# Patient Record
Sex: Male | Born: 1945
Health system: Southern US, Community
[De-identification: ages and names within clinical notes are randomized; demographics above are authoritative.]

## PROBLEM LIST (undated history)

## (undated) DIAGNOSIS — J189 Pneumonia, unspecified organism: Secondary | ICD-10-CM

## (undated) DIAGNOSIS — C4490 Unspecified malignant neoplasm of skin, unspecified: Secondary | ICD-10-CM

## (undated) DIAGNOSIS — I499 Cardiac arrhythmia, unspecified: Secondary | ICD-10-CM

## (undated) DIAGNOSIS — H00019 Hordeolum externum unspecified eye, unspecified eyelid: Secondary | ICD-10-CM

## (undated) DIAGNOSIS — H547 Unspecified visual loss: Secondary | ICD-10-CM

## (undated) DIAGNOSIS — K219 Gastro-esophageal reflux disease without esophagitis: Secondary | ICD-10-CM

## (undated) DIAGNOSIS — I10 Essential (primary) hypertension: Secondary | ICD-10-CM

## (undated) DIAGNOSIS — R0789 Other chest pain: Secondary | ICD-10-CM

## (undated) DIAGNOSIS — J449 Chronic obstructive pulmonary disease, unspecified: Secondary | ICD-10-CM

## (undated) DIAGNOSIS — M1990 Unspecified osteoarthritis, unspecified site: Secondary | ICD-10-CM

## (undated) DIAGNOSIS — R0600 Dyspnea, unspecified: Secondary | ICD-10-CM

## (undated) DIAGNOSIS — J019 Acute sinusitis, unspecified: Secondary | ICD-10-CM

## (undated) DIAGNOSIS — Z8709 Personal history of other diseases of the respiratory system: Secondary | ICD-10-CM

## (undated) DIAGNOSIS — M549 Dorsalgia, unspecified: Secondary | ICD-10-CM

## (undated) HISTORY — DX: Essential (primary) hypertension: I10

## (undated) HISTORY — DX: Chronic obstructive pulmonary disease, unspecified: J44.9

## (undated) HISTORY — DX: Personal history of other diseases of the respiratory system: Z87.09

## (undated) HISTORY — DX: Dorsalgia, unspecified: M54.9

## (undated) HISTORY — DX: Hordeolum externum unspecified eye, unspecified eyelid: H00.019

## (undated) HISTORY — DX: Unspecified visual loss: H54.7

## (undated) HISTORY — DX: Gastro-esophageal reflux disease without esophagitis: K21.9

## (undated) HISTORY — DX: Unspecified osteoarthritis, unspecified site: M19.90

## (undated) HISTORY — PX: TONSILLECTOMY: SUR1361

## (undated) HISTORY — DX: Other chest pain: R07.89

## (undated) HISTORY — DX: Acute sinusitis, unspecified: J01.90

## (undated) HISTORY — PX: OTHER SURGICAL HISTORY: SHX169

## (undated) HISTORY — PX: MOUTH SURGERY: SHX715

## (undated) HISTORY — PX: LUNG SURGERY: SHX703

---

## 2000-10-08 ENCOUNTER — Encounter: Payer: Self-pay | Admitting: Orthopedic Surgery

## 2000-10-10 ENCOUNTER — Ambulatory Visit (HOSPITAL_COMMUNITY): Admission: RE | Admit: 2000-10-10 | Discharge: 2000-10-11 | Payer: Self-pay | Admitting: Orthopedic Surgery

## 2000-10-22 ENCOUNTER — Encounter: Admission: RE | Admit: 2000-10-22 | Discharge: 2000-10-29 | Payer: Self-pay | Admitting: Orthopedic Surgery

## 2002-05-25 ENCOUNTER — Encounter: Payer: Self-pay | Admitting: Critical Care Medicine

## 2002-05-25 ENCOUNTER — Ambulatory Visit (HOSPITAL_COMMUNITY): Admission: RE | Admit: 2002-05-25 | Discharge: 2002-05-25 | Payer: Self-pay | Admitting: Critical Care Medicine

## 2002-07-08 ENCOUNTER — Encounter (INDEPENDENT_AMBULATORY_CARE_PROVIDER_SITE_OTHER): Payer: Self-pay | Admitting: Specialist

## 2002-07-08 ENCOUNTER — Ambulatory Visit (HOSPITAL_COMMUNITY): Admission: RE | Admit: 2002-07-08 | Discharge: 2002-07-08 | Payer: Self-pay | Admitting: Critical Care Medicine

## 2004-07-20 ENCOUNTER — Ambulatory Visit: Payer: Self-pay | Admitting: Critical Care Medicine

## 2004-08-07 ENCOUNTER — Ambulatory Visit: Payer: Self-pay | Admitting: Critical Care Medicine

## 2004-09-04 ENCOUNTER — Ambulatory Visit: Payer: Self-pay | Admitting: Critical Care Medicine

## 2004-10-15 ENCOUNTER — Ambulatory Visit: Payer: Self-pay | Admitting: Critical Care Medicine

## 2004-10-16 ENCOUNTER — Encounter: Admission: RE | Admit: 2004-10-16 | Discharge: 2004-10-16 | Payer: Self-pay | Admitting: Critical Care Medicine

## 2004-11-14 ENCOUNTER — Ambulatory Visit: Payer: Self-pay | Admitting: Critical Care Medicine

## 2004-11-23 ENCOUNTER — Ambulatory Visit (HOSPITAL_COMMUNITY): Admission: RE | Admit: 2004-11-23 | Discharge: 2004-11-23 | Payer: Self-pay | Admitting: Otolaryngology

## 2004-12-19 ENCOUNTER — Ambulatory Visit (HOSPITAL_COMMUNITY): Admission: RE | Admit: 2004-12-19 | Discharge: 2004-12-19 | Payer: Self-pay | Admitting: Otolaryngology

## 2004-12-19 ENCOUNTER — Encounter (INDEPENDENT_AMBULATORY_CARE_PROVIDER_SITE_OTHER): Payer: Self-pay | Admitting: Specialist

## 2005-01-18 ENCOUNTER — Ambulatory Visit: Payer: Self-pay | Admitting: Critical Care Medicine

## 2005-05-31 ENCOUNTER — Ambulatory Visit: Payer: Self-pay | Admitting: Critical Care Medicine

## 2005-09-06 ENCOUNTER — Ambulatory Visit: Payer: Self-pay | Admitting: Critical Care Medicine

## 2006-01-24 ENCOUNTER — Ambulatory Visit: Payer: Self-pay | Admitting: Critical Care Medicine

## 2006-05-21 ENCOUNTER — Ambulatory Visit: Payer: Self-pay | Admitting: Critical Care Medicine

## 2006-11-06 ENCOUNTER — Ambulatory Visit: Payer: Self-pay | Admitting: Critical Care Medicine

## 2007-07-07 DIAGNOSIS — J309 Allergic rhinitis, unspecified: Secondary | ICD-10-CM | POA: Insufficient documentation

## 2007-07-07 DIAGNOSIS — J329 Chronic sinusitis, unspecified: Secondary | ICD-10-CM | POA: Insufficient documentation

## 2007-07-07 DIAGNOSIS — K219 Gastro-esophageal reflux disease without esophagitis: Secondary | ICD-10-CM | POA: Insufficient documentation

## 2007-07-07 DIAGNOSIS — J852 Abscess of lung without pneumonia: Secondary | ICD-10-CM | POA: Insufficient documentation

## 2007-07-07 DIAGNOSIS — J449 Chronic obstructive pulmonary disease, unspecified: Secondary | ICD-10-CM | POA: Insufficient documentation

## 2007-10-30 ENCOUNTER — Ambulatory Visit: Payer: Self-pay | Admitting: Critical Care Medicine

## 2008-04-25 ENCOUNTER — Encounter: Payer: Self-pay | Admitting: Critical Care Medicine

## 2008-04-26 ENCOUNTER — Telehealth (INDEPENDENT_AMBULATORY_CARE_PROVIDER_SITE_OTHER): Payer: Self-pay | Admitting: *Deleted

## 2008-04-27 ENCOUNTER — Ambulatory Visit: Payer: Self-pay | Admitting: Critical Care Medicine

## 2008-05-03 HISTORY — PX: NECK SURGERY: SHX720

## 2008-05-06 ENCOUNTER — Encounter: Payer: Self-pay | Admitting: Critical Care Medicine

## 2008-05-17 ENCOUNTER — Telehealth: Payer: Self-pay | Admitting: Critical Care Medicine

## 2008-05-23 ENCOUNTER — Inpatient Hospital Stay (HOSPITAL_COMMUNITY): Admission: RE | Admit: 2008-05-23 | Discharge: 2008-05-24 | Payer: Self-pay | Admitting: Neurosurgery

## 2008-05-25 ENCOUNTER — Telehealth: Payer: Self-pay | Admitting: Critical Care Medicine

## 2008-06-15 ENCOUNTER — Encounter: Payer: Self-pay | Admitting: Critical Care Medicine

## 2008-07-15 ENCOUNTER — Encounter: Payer: Self-pay | Admitting: Critical Care Medicine

## 2008-08-12 ENCOUNTER — Ambulatory Visit: Payer: Self-pay | Admitting: Critical Care Medicine

## 2008-09-02 HISTORY — PX: OTHER SURGICAL HISTORY: SHX169

## 2008-09-12 ENCOUNTER — Inpatient Hospital Stay (HOSPITAL_COMMUNITY): Admission: RE | Admit: 2008-09-12 | Discharge: 2008-09-15 | Payer: Self-pay | Admitting: Neurosurgery

## 2009-06-21 ENCOUNTER — Ambulatory Visit (HOSPITAL_COMMUNITY): Admission: RE | Admit: 2009-06-21 | Discharge: 2009-06-21 | Payer: Self-pay | Admitting: Neurosurgery

## 2009-06-23 ENCOUNTER — Encounter: Payer: Self-pay | Admitting: Critical Care Medicine

## 2009-06-28 ENCOUNTER — Inpatient Hospital Stay (HOSPITAL_COMMUNITY): Admission: RE | Admit: 2009-06-28 | Discharge: 2009-07-03 | Payer: Self-pay | Admitting: Neurosurgery

## 2009-06-29 ENCOUNTER — Ambulatory Visit: Payer: Self-pay | Admitting: Physical Medicine & Rehabilitation

## 2009-10-03 HISTORY — PX: HERNIA REPAIR: SHX51

## 2009-10-04 ENCOUNTER — Ambulatory Visit (HOSPITAL_BASED_OUTPATIENT_CLINIC_OR_DEPARTMENT_OTHER): Admission: RE | Admit: 2009-10-04 | Discharge: 2009-10-04 | Payer: Self-pay | Admitting: General Surgery

## 2009-12-07 ENCOUNTER — Ambulatory Visit: Payer: Self-pay | Admitting: Critical Care Medicine

## 2009-12-26 ENCOUNTER — Encounter: Payer: Self-pay | Admitting: Critical Care Medicine

## 2010-01-02 ENCOUNTER — Encounter: Payer: Self-pay | Admitting: Critical Care Medicine

## 2010-01-30 ENCOUNTER — Ambulatory Visit: Payer: Self-pay | Admitting: Critical Care Medicine

## 2010-03-28 ENCOUNTER — Ambulatory Visit: Payer: Self-pay | Admitting: Critical Care Medicine

## 2010-04-11 ENCOUNTER — Ambulatory Visit: Payer: Self-pay | Admitting: Critical Care Medicine

## 2010-05-15 ENCOUNTER — Ambulatory Visit: Payer: Self-pay | Admitting: Critical Care Medicine

## 2010-05-23 ENCOUNTER — Ambulatory Visit: Payer: Self-pay | Admitting: Internal Medicine

## 2010-05-23 DIAGNOSIS — I10 Essential (primary) hypertension: Secondary | ICD-10-CM | POA: Insufficient documentation

## 2010-06-20 ENCOUNTER — Ambulatory Visit: Payer: Self-pay | Admitting: Internal Medicine

## 2010-06-20 LAB — CONVERTED CEMR LAB
ALT: 18 units/L (ref 0–53)
AST: 24 units/L (ref 0–37)
Albumin: 4.1 g/dL (ref 3.5–5.2)
Alkaline Phosphatase: 72 units/L (ref 39–117)
BUN: 25 mg/dL — ABNORMAL HIGH (ref 6–23)
Basophils Absolute: 0.1 10*3/uL (ref 0.0–0.1)
Basophils Relative: 1 % (ref 0–1)
Bilirubin, Direct: 0.2 mg/dL (ref 0.0–0.3)
CO2: 23 meq/L (ref 19–32)
Calcium: 9.1 mg/dL (ref 8.4–10.5)
Chloride: 106 meq/L (ref 96–112)
Cholesterol: 137 mg/dL (ref 0–200)
Creatinine, Ser: 0.83 mg/dL (ref 0.40–1.50)
Eosinophils Absolute: 0.1 10*3/uL (ref 0.0–0.7)
Eosinophils Relative: 2 % (ref 0–5)
Glucose, Bld: 96 mg/dL (ref 70–99)
HCT: 40.4 % (ref 39.0–52.0)
HDL: 51 mg/dL (ref >39)
Hemoglobin: 13.4 g/dL (ref 13.0–17.0)
Indirect Bilirubin: 0.7 mg/dL (ref 0.0–0.9)
LDL Cholesterol: 73 mg/dL (ref 0–99)
Lymphocytes Relative: 24 % (ref 12–46)
Lymphs Abs: 1.3 10*3/uL (ref 0.7–4.0)
MCHC: 33.2 g/dL (ref 30.0–36.0)
MCV: 90.2 fL (ref 78.0–100.0)
Monocytes Absolute: 0.4 10*3/uL (ref 0.1–1.0)
Monocytes Relative: 7 % (ref 3–12)
Neutro Abs: 3.7 10*3/uL (ref 1.7–7.7)
Neutrophils Relative %: 67 % (ref 43–77)
PSA: 1.07 ng/mL (ref 0.10–4.00)
Platelets: 279 10*3/uL (ref 150–400)
Potassium: 4.1 meq/L (ref 3.5–5.3)
RBC: 4.48 M/uL (ref 4.22–5.81)
RDW: 13.8 % (ref 11.5–15.5)
Sodium: 141 meq/L (ref 135–145)
TSH: 0.636 microintl units/mL (ref 0.350–4.500)
Total Bilirubin: 0.9 mg/dL (ref 0.3–1.2)
Total CHOL/HDL Ratio: 2.7
Total Protein: 6.5 g/dL (ref 6.0–8.3)
Triglycerides: 64 mg/dL (ref <150)
VLDL: 13 mg/dL (ref 0–40)
WBC: 5.5 10*3/uL (ref 4.0–10.5)

## 2010-06-21 ENCOUNTER — Encounter: Payer: Self-pay | Admitting: Internal Medicine

## 2010-06-21 ENCOUNTER — Telehealth: Payer: Self-pay | Admitting: Internal Medicine

## 2010-06-26 ENCOUNTER — Telehealth: Payer: Self-pay | Admitting: Internal Medicine

## 2010-06-27 ENCOUNTER — Telehealth: Payer: Self-pay | Admitting: Critical Care Medicine

## 2010-07-04 ENCOUNTER — Telehealth (INDEPENDENT_AMBULATORY_CARE_PROVIDER_SITE_OTHER): Payer: Self-pay | Admitting: *Deleted

## 2010-07-16 ENCOUNTER — Telehealth (INDEPENDENT_AMBULATORY_CARE_PROVIDER_SITE_OTHER): Payer: Self-pay | Admitting: *Deleted

## 2010-08-15 ENCOUNTER — Encounter: Payer: Self-pay | Admitting: Internal Medicine

## 2010-08-15 ENCOUNTER — Ambulatory Visit: Payer: Self-pay | Admitting: Internal Medicine

## 2010-08-15 DIAGNOSIS — M1990 Unspecified osteoarthritis, unspecified site: Secondary | ICD-10-CM | POA: Insufficient documentation

## 2010-08-15 LAB — CONVERTED CEMR LAB
BUN: 19 mg/dL (ref 6–23)
CO2: 26 meq/L (ref 19–32)
Calcium: 9.6 mg/dL (ref 8.4–10.5)
Chloride: 107 meq/L (ref 96–112)
Creatinine, Ser: 0.85 mg/dL (ref 0.40–1.50)
Glucose, Bld: 83 mg/dL (ref 70–99)
Potassium: 4.5 meq/L (ref 3.5–5.3)
Sodium: 142 meq/L (ref 135–145)

## 2010-08-17 ENCOUNTER — Encounter: Payer: Self-pay | Admitting: Internal Medicine

## 2010-08-20 ENCOUNTER — Telehealth: Payer: Self-pay | Admitting: Internal Medicine

## 2010-09-06 ENCOUNTER — Telehealth (INDEPENDENT_AMBULATORY_CARE_PROVIDER_SITE_OTHER): Payer: Self-pay | Admitting: *Deleted

## 2010-09-12 ENCOUNTER — Ambulatory Visit: Admit: 2010-09-12 | Payer: Self-pay | Admitting: Internal Medicine

## 2010-09-25 ENCOUNTER — Telehealth (INDEPENDENT_AMBULATORY_CARE_PROVIDER_SITE_OTHER): Payer: Self-pay | Admitting: *Deleted

## 2010-10-04 NOTE — Miscellaneous (Signed)
Summary: Orders Update  Clinical Lists Changes  Orders: Added new Service order of Est. Patient Level III (99213) - Signed 

## 2010-10-04 NOTE — Progress Notes (Signed)
Summary: CLEARANCE--responded  Phone Note Other Incoming Call back at [phone]   Call placed by: DR HIRSCH'S OFFICE REGINA Call placed to: WRIGHT Summary of Call: NEED PRE OP CLEARANCE FOR SURGERY PRESS 0 AND HAVE REGINA OVER HEAD PAGE Initial call taken by: Rickard Patience,  April 26, 2008 9:40 AM  Follow-up for Phone Call        Pt will need ov for surgical clearance, per PW. Michel Bickers [facility]  April 26, 2008 12:31 PM  called and spoke with regina. informed her pt needs ov with dr. Delford Field first in order for surgical clearance.  called and spoke with pt. pt made an appt with dr. Delford Field for wed august 26 @ 9:50am.  Cyndia Diver LPN  April 26, 2008 2:11 PM

## 2010-10-04 NOTE — Assessment & Plan Note (Signed)
Summary: mole removal and follow up/mhf   Vital Signs:  Patient profile:   65 year old male Height:      67.5 inches Weight:      149.50 pounds BMI:     23.15 O2 Sat:      97 % on Room air Temp:     97.8 degrees F oral Pulse rate:   63 / minute Pulse rhythm:   regular Resp:     18 per minute BP sitting:   140 / 80  (left arm) Cuff size:   regular  Vitals Entered By: Glendell Docker CMA (June 20, 2010 9:30 AM)  O2 Flow:  Room air  Primary Care Provider:  D. Thomos Lemons DO   History of Present Illness:  Hypertension Follow-Up      This is a 65 year old man who presents for Hypertension follow-up.  The patient denies lightheadedness and headaches.  The patient denies the following associated symptoms: chest pain.  Compliance with medications (by patient report) has been near 100%.  tolerating losartan  he is concerned re:  enlarging moles  Preventive Screening-Counseling & Management  Alcohol-Tobacco     Smoking Status: quit  Allergies: No Known Drug Allergies  Past History:  Past Medical History: Allergic rhinitis COPD    -FeV1 64% 2007 GERD  Hx lung abscess/bronchiectasis with RML and RLL resection - 1996 (Dr. Edwyna Shell) Hypertension  Past Surgical History: RML and RUL removed.   due to bleeding and bronchiectasis  and lung abscess  Neck Surgery 05/2008 lower back surgery 09/2008 Lower back surgery 06/2009  hernia surgery 10/2009  Family History: Prostate Cancer father died from this  (age 31) Family History of Arthritis Family History of CAD Male 1st degree relative <60 Family History Hypertension Family History of Stroke M 1st degree relative <50 mother died of CHF - age 20    Social History: Retired Librarian, academic Patient states former smoker. 1 1/2 ppd x 38 yrs  Quit in Jan 2002 Married - 2 weeks (4th marriage) divorced,  remarried 84-2004 (lost wife to lung ca),  remarried (divorced),  1 son  - 7 [name]) Alcohol use-yes (2-3 beers per  week)   Physical Exam  General:  alert, well-developed, and well-nourished.   Lungs:  normal respiratory effort, no crackles, and no wheezes.   Heart:  normal rate, regular rhythm, and no gallop.   Skin:  2-3 mm pearly raised lesion below right scapula  4 mm hyperpigmented lesion with irregular border - right thoraco - lumbar region of right back   Impression & Recommendations:  Problem # 1:  DYSPLASTIC NEVUS, BACK (ICD-448.1)  pt with 2 enlarging hyperpigmented lesions on his back consent obtained. area prepped shave biopsy performed utilizing aseptic technique .5 cc of 1% lidocaine use as anesthetic no complications after care discussed  Orders: Shave Skin Lesion 0.6-1.0 cm/trunk/arm/leg (11301)  Problem # 2:  HYPERTENSION (ICD-401.9) Assessment: Improved titrate losartan to 100 mg  His updated medication list for this problem includes:    Amlodipine Besylate 5 Mg Tabs (Amlodipine besylate) .Marland Kitchen... Take 1 tablet by mouth once a day    Losartan Potassium 100 Mg Tabs (Losartan potassium) ..... One by mouth once daily  BP today: 130/80 Prior BP: 150/90 (05/23/2010)  Complete Medication List: 1)  Nasacort Aq 55 Mcg/act Aers (Triamcinolone acetonide(nasal)) .... Two puff once daily ea nostirl 2)  Amlodipine Besylate 5 Mg Tabs (Amlodipine besylate) .... Take 1 tablet by mouth once a day 3)  Pulmicort  Flexhaler 180 Mcg/act Inha (Budesonide (inhalation)) .... Two  puffs once daily 4)  Proair Hfa 108 (90 Base) Mcg/act Aers (Albuterol sulfate) .Marland Kitchen.. 1-2 puffs every 4-6 hours as needed 5)  Diclofenac Sodium 75 Mg Tbec (Diclofenac sodium) .Marland Kitchen.. 1 by mouth two times a day 6)  Mucinex 600 Mg Xr12h-tab (Guaifenesin) .... Two times a day 7)  Tums E-x 750 750 Mg Chew (Calcium carbonate antacid) .... Two times a day 8)  Glucosamine-chondroitin 500-400 Mg Caps (Glucosamine-chondroitin) .... Two times a day 9)  Multivitamins Tabs (Multiple vitamin) .... Three times weekly 10)  Losartan  Potassium 100 Mg Tabs (Losartan potassium) .... One by mouth once daily  Patient Instructions: 1)  Please schedule a follow-up appointment in 2 months. Prescriptions: LOSARTAN POTASSIUM 100 MG TABS (LOSARTAN POTASSIUM) one by mouth once daily  #30 x 1   Entered and Authorized by:   D. Thomos Lemons DO   Signed by:   D. Thomos Lemons DO on 06/20/2010   Method used:   Electronically to        [facility]  [address]* (retail)       [address]       [place], [place]  [address]       Ph: [phone]       Fax: [phone]   RxID:   [id number]    Orders Added: 1)  Est. Patient Level III [95638] 2)  Shave Skin Lesion 0.6-1.0 cm/trunk/arm/leg [11301]

## 2010-10-04 NOTE — Progress Notes (Signed)
Summary: Biopsy Results  Phone Note Outgoing Call   Summary of Call: call pt - skin biopsy results - negative for cancer.  (plz mail copy of path report to pt) Initial call taken by: D. Thomos Lemons DO,  June 21, 2010 5:59 PM  Follow-up for Phone Call        call placed to patient at [phone], he has been advised per Dr Artist Pais instructions. Copy of report mailed to patient  Follow-up by: Glendell Docker CMA,  June 22, 2010 8:33 AM

## 2010-10-04 NOTE — Assessment & Plan Note (Signed)
Summary: Pulmonary OV   Primary Provider/Referring Provider:  Wyvonnia Lora  CC:  2 month follow up.  Pt states breathing is not doing as well on qvar as it was on pulmicort.  States he is having increased SOB with activity, coughing more frequently-occ prod with clear mucus, and occ wheezing when outside.  Denies chest tightness.  Marland Kitchen  History of Present Illness: Pulmonary OV :     This is a 65 year old, white male, history of chronic obstructive airways disease, asthmatic bronchitic component.     December 07, 2009 12:04 PM Had neck surgery 9/09.    Not able to obtain pulmicort now,  now on flovent Then had lower back surgery, 09/2008.    Then july 2010: tried to resume normal activity and had disc rupture and had to go back to another work in lower back 10/10.  then hernia and this repaired 2/11.   switched to flovent from pulmicort   (was out)  Jan 30, 2010 9:11 AM Not as well with qvar even with aerochamber. Pt was doing better on pulmicort. Pt stated pharmacy not able to obtain pulmicort. Notes more dyspnea and wheeze and cough with phlegm.  On qvar  two puff two times a day    Preventive Screening-Counseling & Management  Alcohol-Tobacco     Smoking Status: quit > 6 months  Current Medications (verified): 1)  Nasacort Aq 55 Mcg/act  Aers (Triamcinolone Acetonide(Nasal)) .... Two Puff Once Daily Ea Nostirl 2)  Amlodipine Besylate 10 Mg Tabs (Amlodipine Besylate) .... Once Daily 3)  Qvar 40 Mcg/act  Aers (Beclomethasone Dipropionate) .... Two Puffs Twice Daily Use With Aerochamber 4)  Proair Hfa 108 (90 Base) Mcg/act  Aers (Albuterol Sulfate) .Marland Kitchen.. 1-2 Puffs Every 4-6 Hours As Needed 5)  Diclofenac Sodium 75 Mg Tbec (Diclofenac Sodium) .Marland Kitchen.. 1 By Mouth Two Times A Day 6)  Mucinex 600 Mg Xr12h-Tab (Guaifenesin) .... Two Times A Day 7)  Tums E-X 750 750 Mg Chew (Calcium Carbonate Antacid) .... Two Times A Day 8)  Glucosamine-Chondroitin 500-400 Mg Caps (Glucosamine-Chondroitin)  .... Two Times A Day 9)  Vitamin E 400 Unit Caps (Vitamin E) .... Once Daily 10)  Aerochamber Mv  Misc (Spacer/aero-Holding Chambers) .... Use With Spacer 11)  Multivitamins  Tabs (Multiple Vitamin) .... Three Times Weekly  Allergies (verified): No Known Drug Allergies  Past History:  Past medical, surgical, family and social histories (including risk factors) reviewed, and no changes noted (except as noted below).  Past Medical History: Reviewed history from 04/27/2008 and no changes required. Allergic rhinitis COPD    -FeV1 64% 2007 GERD Hx lung abscess/bronchiectasis with RML and RLL resection   Past Surgical History: Reviewed history from 12/07/2009 and no changes required. RML and RUL removed.   due to bleeding and bronchiectasis  and lung abscess   Neck Surgery 05/2008 lower back surgery 09/2008 Lower back surgery 06/2009 hernia surgery 10/2009  Family History: Reviewed history from 10/30/2007 and no changes required. Prostate Cancer father died from this  Social History: Reviewed history from 10/30/2007 and no changes required. Patient states former smoker.   Review of Systems       The patient complains of shortness of breath with activity, shortness of breath at rest, and non-productive cough.  The patient denies productive cough, coughing up blood, chest pain, irregular heartbeats, acid heartburn, indigestion, loss of appetite, weight change, abdominal pain, difficulty swallowing, sore throat, tooth/dental problems, headaches, nasal congestion/difficulty breathing through nose, sneezing, itching, ear ache, anxiety,  depression, hand/feet swelling, joint stiffness or pain, rash, change in color of mucus, and fever.    Vital Signs:  Patient profile:   65 year old male Height:      68 inches Weight:      153 pounds BMI:     23.35 O2 Sat:      96 % on Room air Temp:     98.3 degrees F oral Pulse rate:   95 / minute BP sitting:   160 / 88  (right arm) Cuff size:    regular  Vitals Entered By: Gweneth Dimitri RN (Jan 30, 2010 9:04 AM)  O2 Flow:  Room air CC: 2 month follow up.  Pt states breathing is not doing as well on qvar as it was on pulmicort.  States he is having increased SOB with activity, coughing more frequently-occ prod with clear mucus, occ wheezing when outside.  Denies chest tightness.   Comments Medications reviewed with patient Daytime contact number verified with patient. Gweneth Dimitri RN  Jan 30, 2010 9:07 AM    Physical Exam  Additional Exam:  Gen: WD WN   WM       in NAD    NCAT Heent:  no jvd, no TMG, no cervical LNademopathy, orophyx clear,  nares with clear watery drainage. Cor: RRR nl s1/s2  no s3/s4  no m r h g Abd: soft NT BSA   no masses  No HSM  no rebound or guarding Ext perfused with no c v e v.d Neuro: intact, moves all 4s, CN II-XII intact, DTRs intact Chest: distant BS  no wheezes, rales, rhonchi   no egophony  no consolidative breath sounds, mild hyperresonance to percussion, prominant pseudowheeze Skin: clear  Genital/Rectal :deferred    Impression & Recommendations:  Problem # 1:  COPD (ICD-496) Assessment Deteriorated  copd with upper airway obstruction due to prior cspine surgery made worse with flovent hfa  and not doing well on qvar  plan change back to pulmicort,  two puff twice daily samples given Proair as needed Return two months   Medications Added to Medication List This Visit: 1)  Pulmicort Flexhaler 180 Mcg/act Inha (Budesonide (inhalation)) .... Two  puffs twice daily 2)  Multivitamins Tabs (Multiple vitamin) .... Three times weekly  Complete Medication List: 1)  Nasacort Aq 55 Mcg/act Aers (Triamcinolone acetonide(nasal)) .... Two puff once daily ea nostirl 2)  Amlodipine Besylate 10 Mg Tabs (Amlodipine besylate) .... Once daily 3)  Pulmicort Flexhaler 180 Mcg/act Inha (Budesonide (inhalation)) .... Two  puffs twice daily 4)  Proair Hfa 108 (90 Base) Mcg/act Aers (Albuterol sulfate)  .Marland Kitchen.. 1-2 puffs every 4-6 hours as needed 5)  Diclofenac Sodium 75 Mg Tbec (Diclofenac sodium) .Marland Kitchen.. 1 by mouth two times a day 6)  Mucinex 600 Mg Xr12h-tab (Guaifenesin) .... Two times a day 7)  Tums E-x 750 750 Mg Chew (Calcium carbonate antacid) .... Two times a day 8)  Glucosamine-chondroitin 500-400 Mg Caps (Glucosamine-chondroitin) .... Two times a day 9)  Vitamin E 400 Unit Caps (Vitamin e) .... Once daily 10)  Aerochamber Mv Misc (Spacer/aero-holding chambers) .... Use with spacer 11)  Multivitamins Tabs (Multiple vitamin) .... Three times weekly  Other Orders: Est. Patient Level III (60454)  Patient Instructions: 1)  Stop Qvar 2)  Start Pulmicort two puff twice a day for now,  will see about reduction in dose later 3)  Return two months Prescriptions: PULMICORT FLEXHALER 180 MCG/ACT  INHA (BUDESONIDE (INHALATION)) Two  puffs twice daily  Brand medically necessary #1 x 6   Entered and Authorized by:   Storm Frisk MD   Signed by:   Storm Frisk MD on 01/30/2010   Method used:   Electronically to        [facility]  [address] (retail)       [address]       [place], [place]  [address]       Ph: [phone]       Fax: [phone]   RxID:   [id number]     Appended Document: Pulmonary OV fax Wyvonnia Lora

## 2010-10-04 NOTE — Progress Notes (Signed)
  Phone Note Other Incoming   Request: Send information Summary of Call: Request for records received from [employer].  Request forwarded to [facility].

## 2010-10-04 NOTE — Letter (Signed)
Summary: CMN for Aerochamber/[facility]  CMN for Aerochamber/[facility]   Imported By: Sherian Rein 01/01/2010 07:48:02  _____________________________________________________________________  External Attachment:    Type:   Image     Comment:   External Document

## 2010-10-04 NOTE — Letter (Signed)
Summary: ACF for myelopathy/[facility]  ACF for myelopathy/[facility]   Imported By: Lester [place] 07/05/2008 10:58:33  _____________________________________________________________________  External Attachment:    Type:   Image     Comment:   External Document

## 2010-10-04 NOTE — Progress Notes (Signed)
Summary: refill-- diclofenac, amlodipine  Phone Note Call from Patient Call back at Home Phone [phone] Call back at cell [phone]   Caller: Patient Call For: Sanjuana Mruk  Summary of Call: please send all his rx's to [facility]  He has [employer] BZZW [id number]  Initial call taken by: Roselle Locus,  June 26, 2010 10:45 AM  Follow-up for Phone Call        Left message on phone to call us with specific med names that he needs refilled.  Inhalers need to be refilled by pulmonology. Nicki Guadalajara Fergerson CMA Duncan Dull)  June 27, 2010 9:06 AM   Additional Follow-up for Phone Call Additional follow up Details #1::        Pt called back stating he needs refills on Amlodipine, diclofenac sodium, Nasacort and Pulmicort. Advised pt we will send refills on Amlodipine and Diclofenac but Dr. Delford Field will need to refill nasacort and pulmicort flex haler; request has been sent to Dr Delford Field for these medications. Nicki Guadalajara Fergerson CMA Duncan Dull)  June 27, 2010 9:45 AM     Additional Follow-up for Phone Call Additional follow up Details #2::    we can refill amlodipine.   I suggest OV to discuss long term use of NSAIDs ok to provide only 1 month refill on diclofenac Follow-up by: D. Thomos Lemons DO,  June 27, 2010 11:56 AM  Additional Follow-up for Phone Call Additional follow up Details #3:: Details for Additional Follow-up Action Taken: Left message on machine to return my call. Nicki Guadalajara Fergerson CMA Duncan Dull)  June 27, 2010 3:33 PM   Pt returned my call and was notified per Dr Olegario Messier instruction. Pt states he just picked up a 90 day supply on his Diclofenac from his previous doctor and will discuss long term use with Dr Artist Pais at his next visit. Pt also states he did not need Amlodipine refilled at present but was just wanting [facility] to have it on file as his insurance was getting ready to change. Pt states he received a call from [facility] and he told them not to send the Amlodipine yet. He will contact us when he is ready  for a refill. Nicki Guadalajara Fergerson CMA Duncan Dull)  June 27, 2010 5:15 PM   Prescriptions: AMLODIPINE BESYLATE 5 MG TABS (AMLODIPINE BESYLATE) Take 1 tablet by mouth once a day  #90 x 3   Entered and Authorized by:   D. Thomos Lemons DO   Signed by:   D. Thomos Lemons DO on 06/27/2010   Method used:   Faxed to ...       [facility] (mail-order)             , [place]         Ph: [phone]       Fax: [phone]   RxID:   [id number]

## 2010-10-04 NOTE — Letter (Signed)
   [place] [facility] [address] [place], [place]  [address]  [place] Phone: [phone]      August 17, 2010   [facility] [address] [place], [place] [address]  RE:  LAB RESULTS  Dear  Mr. TESTERMAN,  The following is an interpretation of your most recent lab tests.  Please take note of any instructions provided or changes to medications that have resulted from your lab work.  ELECTROLYTES:  Good - no changes needed  KIDNEY FUNCTION TESTS:  Good - no changes needed         Sincerely Yours,    Dr. Thomos Lemons  Appended Document:  mailed

## 2010-10-04 NOTE — Assessment & Plan Note (Signed)
Summary: Pulmonary OV   Visit Type:  Follow-up Primary Provider/Referring Provider:  Wyvonnia Lora  CC:  COPD follow-up. The patient says his SOB is worse due to the heat and humidity. He is also clearing his throat more.Marland Kitchen  History of Present Illness: Pulmonary OV :     This is a 65 year old, white male, history of chronic obstructive airways disease, asthmatic bronchitic component.     Jan 30, 2010 9:11 AM Not as well with qvar even with aerochamber. Pt was doing better on pulmicort. Pt stated pharmacy not able to obtain pulmicort. Notes more dyspnea and wheeze and cough with phlegm.  On qvar  two puff two times a day   March 28, 2010 4:28 PM The pt  is doing better,  Not much cough,  There is  sl more dyspnea with heat and humidity. Pt denies any significant sore throat, nasal congestion or excess secretions, fever, chills, sweats, unintended weight loss, pleurtic or exertional chest pain, orthopnea PND, or leg swelling Pt denies any increase in rescue therapy over baseline, denies waking up needing it or having any early am or nocturnal exacerbations of coughing/wheezing/or dyspnea. The pulmicort is working better for the patient.  Preventive Screening-Counseling & Management  Alcohol-Tobacco     Smoking Status: quit > 6 months     Year Quit: 2002     Pack years: 17  Current Medications (verified): 1)  Nasacort Aq 55 Mcg/act  Aers (Triamcinolone Acetonide(Nasal)) .... Two Puff Once Daily Ea Nostirl 2)  Amlodipine Besylate 10 Mg Tabs (Amlodipine Besylate) .... Once Daily 3)  Pulmicort Flexhaler 180 Mcg/act  Inha (Budesonide (Inhalation)) .... Two  Puffs Twice Daily 4)  Proair Hfa 108 (90 Base) Mcg/act  Aers (Albuterol Sulfate) .Marland Kitchen.. 1-2 Puffs Every 4-6 Hours As Needed 5)  Diclofenac Sodium 75 Mg Tbec (Diclofenac Sodium) .Marland Kitchen.. 1 By Mouth Two Times A Day 6)  Mucinex 600 Mg Xr12h-Tab (Guaifenesin) .... Two Times A Day 7)  Tums E-X 750 750 Mg Chew (Calcium Carbonate Antacid)  .... Two Times A Day 8)  Glucosamine-Chondroitin 500-400 Mg Caps (Glucosamine-Chondroitin) .... Two Times A Day 9)  Vitamin E 400 Unit Caps (Vitamin E) .... Once Daily 10)  Multivitamins  Tabs (Multiple Vitamin) .... Three Times Weekly  Allergies (verified): No Known Drug Allergies  Past History:  Past medical, surgical, family and social histories (including risk factors) reviewed, and no changes noted (except as noted below).  Past Medical History: Reviewed history from 04/27/2008 and no changes required. Allergic rhinitis COPD    -FeV1 64% 2007 GERD Hx lung abscess/bronchiectasis with RML and RLL resection   Past Surgical History: Reviewed history from 12/07/2009 and no changes required. RML and RUL removed.   due to bleeding and bronchiectasis  and lung abscess   Neck Surgery 05/2008 lower back surgery 09/2008 Lower back surgery 06/2009 hernia surgery 10/2009  Family History: Reviewed history from 10/30/2007 and no changes required. Prostate Cancer father died from this  Social History: Reviewed history from 10/30/2007 and no changes required. Patient states former smoker.   Review of Systems       The patient complains of shortness of breath with activity and non-productive cough.  The patient denies shortness of breath at rest, productive cough, coughing up blood, chest pain, irregular heartbeats, acid heartburn, indigestion, loss of appetite, weight change, abdominal pain, difficulty swallowing, sore throat, tooth/dental problems, headaches, nasal congestion/difficulty breathing through nose, sneezing, itching, ear ache, anxiety, depression, hand/feet swelling, joint stiffness or pain, rash, change  in color of mucus, and fever.    Vital Signs:  Patient profile:   65 year old male Height:      68 inches (172.72 cm) Weight:      150.13 pounds (68.24 kg) BMI:     22.91 O2 Sat:      97 % on Room air Temp:     98.5 degrees F (36.94 degrees C) oral Pulse rate:   94 /  minute BP sitting:   116 / 74  (right arm) Cuff size:   regular  Vitals Entered By: Michel Bickers CMA (March 28, 2010 4:11 PM)  O2 Sat at Rest %:  97 O2 Flow:  Room air CC: COPD follow-up. The patient says his SOB is worse due to the heat and humidity. He is also clearing his throat more. Comments Medications reviewed with the patient. Daytime phone verified. Michel Bickers CMA  March 28, 2010 4:12 PM   Physical Exam  Additional Exam:  Gen: WD WN   WM       in NAD    NCAT Heent:  no jvd, no TMG, no cervical LNademopathy, orophyx clear,  nares with clear watery drainage. Cor: RRR nl s1/s2  no s3/s4  no m r h g Abd: soft NT BSA   no masses  No HSM  no rebound or guarding Ext perfused with no c v e v.d Neuro: intact, moves all 4s, CN II-XII intact, DTRs intact Chest: distant BS  no wheezes, rales, rhonchi   no egophony  no consolidative breath sounds, mild hyperresonance to percussion, prominant pseudowheeze Skin: clear  Genital/Rectal :deferred    Impression & Recommendations:  Problem # 1:  COPD (ICD-496) Assessment Improved  copd with upper airway obstruction due to prior cspine surgery made worse with flovent hfa  and not doing well on qvar better with pulmicort flexihaler  plan try to wean pulmicort to two puff daily  Medications Added to Medication List This Visit: 1)  Pulmicort Flexhaler 180 Mcg/act Inha (Budesonide (inhalation)) .... Two  puffs once daily  Complete Medication List: 1)  Nasacort Aq 55 Mcg/act Aers (Triamcinolone acetonide(nasal)) .... Two puff once daily ea nostirl 2)  Amlodipine Besylate 10 Mg Tabs (Amlodipine besylate) .... Once daily 3)  Pulmicort Flexhaler 180 Mcg/act Inha (Budesonide (inhalation)) .... Two  puffs once daily 4)  Proair Hfa 108 (90 Base) Mcg/act Aers (Albuterol sulfate) .Marland Kitchen.. 1-2 puffs every 4-6 hours as needed 5)  Diclofenac Sodium 75 Mg Tbec (Diclofenac sodium) .Marland Kitchen.. 1 by mouth two times a day 6)  Mucinex 600 Mg Xr12h-tab (Guaifenesin)  .... Two times a day 7)  Tums E-x 750 750 Mg Chew (Calcium carbonate antacid) .... Two times a day 8)  Glucosamine-chondroitin 500-400 Mg Caps (Glucosamine-chondroitin) .... Two times a day 9)  Vitamin E 400 Unit Caps (Vitamin e) .... Once daily 10)  Multivitamins Tabs (Multiple vitamin) .... Three times weekly  Other Orders: Est. Patient Level III (91478)  Patient Instructions: 1)  Reduce pulmicort two puffs daily 2)  Return 4 months   Appended Document: Pulmonary OV fax david tapper

## 2010-10-04 NOTE — Letter (Signed)
   [facility] [address] [place], [place]  [address]  [place] Phone: [phone]      June 21, 2010   [facility] [address] [place], [place] [address]  RE:  LAB RESULTS  Dear  Mr. GROENE,  The following is an interpretation of your most recent lab tests.  Please take note of any instructions provided or changes to medications that have resulted from your lab work.  PSA:  normal - no follow-up needed PSA: 1.07  ELECTROLYTES:  Good - no changes needed  KIDNEY FUNCTION TESTS:  Good - no changes needed  LIVER FUNCTION TESTS:  Good - no changes needed  LIPID PANEL:  Good - no changes needed Triglyceride: 64   Cholesterol: 137   LDL: 73   HDL: 51   Chol/HDL%:  2.7 Ratio  THYROID STUDIES:  Thyroid studies normal TSH: 0.636     CBC:  Good - no changes needed       Sincerely Yours,    Dr. Thomos Lemons  Appended Document:  mailed

## 2010-10-04 NOTE — Progress Notes (Addendum)
  Phone Note Other Incoming   Request: Send information Summary of Call: Request for records received from [employer].Request forwarded to [facility].  5 yrs- Delford Field.     Appended Document:  Request for records received from [employer]. Request forwarded to [facility].  5 yrs-Wright.

## 2010-10-04 NOTE — Progress Notes (Signed)
  Phone Note Other Incoming   Request: Send information Summary of Call: Request for records received from [employer]. Request forwarded to [facility].  Wright-5 years

## 2010-10-04 NOTE — Letter (Signed)
Summary: [facility]  [facility]   Imported By: Esmeralda Links D'jimraou 07/30/2008 10:10:57  _____________________________________________________________________  External Attachment:    Type:   Image     Comment:   External Document

## 2010-10-04 NOTE — Letter (Signed)
Summary: Back pain & weakness in the leg/[facility]  Back pain & weakness in the leg/[facility]   Imported By: Sherian Rein 07/10/2009 10:54:08  _____________________________________________________________________  External Attachment:    Type:   Image     Comment:   External Document

## 2010-10-04 NOTE — Assessment & Plan Note (Signed)
Summary: Pulmonary OV   Primary Provider/Referring Provider:  Wyvonnia Lora  CC:  Acute Visit.  c/o head and chest congestion, prod cough with clear to yellow mucus, wheezing, and mild chest tightness x couple of days.  Denies increased SOB and f/c/s.[name]  History of Present Illness: Pulmonary OV :     This is a 65 year old, white male, history of chronic obstructive airways disease, asthmatic bronchitic component.     Jan 30, 2010 9:11 AM Not as well with qvar even with aerochamber. Pt was doing better on pulmicort. Pt stated pharmacy not able to obtain pulmicort. Notes more dyspnea and wheeze and cough with phlegm.  On qvar  two puff two times a day   March 28, 2010 4:28 PM The pt  is doing better,  Not much cough,  There is  sl more dyspnea with heat and humidity. Pt denies any significant sore throat, nasal congestion or excess secretions, fever, chills, sweats, unintended weight loss, pleurtic or exertional chest pain, orthopnea PND, or leg swelling Pt denies any increase in rescue therapy over baseline, denies waking up needing it or having any early am or nocturnal exacerbations of coughing/wheezing/or dyspnea. The pulmicort is working better for the patient.  April 11, 2010 3:20 PM The pt was with a friend who had PNA. The pt also had other sick exposures over the past two weeks.  The pt   now notes the sinuses  are congested, now notes some cough, mucus is clear to yellow, mucus out of nose is pink.  No real sinus pressure, just full sinuses and pndrip. No f/c/s.  Dyspnea is same.  No chest pain.  Feels tired only.   Pt denies any significant sore throat, fever, chills, sweats, unintended weight loss, pleurtic or exertional chest pain, orthopnea PND, or leg swelling   Preventive Screening-Counseling & Management  Alcohol-Tobacco     Smoking Status: quit > 6 months     Year Quit: 2002     Pack years: 40  Current Medications (verified): 1)  Nasacort Aq 55 Mcg/act  Aers  (Triamcinolone Acetonide(Nasal)) .... Two Puff Once Daily Ea Nostirl 2)  Amlodipine Besylate 10 Mg Tabs (Amlodipine Besylate) .... Once Daily 3)  Pulmicort Flexhaler 180 Mcg/act  Inha (Budesonide (Inhalation)) .... Two  Puffs Once Daily 4)  Proair Hfa 108 (90 Base) Mcg/act  Aers (Albuterol Sulfate) .[name].. 1-2 Puffs Every 4-6 Hours As Needed 5)  Diclofenac Sodium 75 Mg Tbec (Diclofenac Sodium) .[name].. 1 By Mouth Two Times A Day 6)  Mucinex 600 Mg Xr12h-Tab (Guaifenesin) .... Two Times A Day 7)  Tums E-X 750 750 Mg Chew (Calcium Carbonate Antacid) .... Two Times A Day 8)  Glucosamine-Chondroitin 500-400 Mg Caps (Glucosamine-Chondroitin) .... Two Times A Day 9)  Vitamin E 400 Unit Caps (Vitamin E) .... Once Daily 10)  Multivitamins  Tabs (Multiple Vitamin) .... Three Times Weekly 11)  Nyquil 60-7.01-29-999 Mg/85ml Liqd (Pseudoeph-Doxylamine-Dm-Apap) .... As Needed  Allergies (verified): No Known Drug Allergies  Past History:  Past medical, surgical, family and social histories (including risk factors) reviewed, and no changes noted (except as noted below).  Past Medical History: Reviewed history from 04/27/2008 and no changes required. Allergic rhinitis COPD    -FeV1 64% 2007 GERD Hx lung abscess/bronchiectasis with RML and RLL resection   Past Surgical History: RML and RUL removed.   due to bleeding and bronchiectasis  and lung abscess  Neck Surgery 05/2008 lower back surgery 09/2008 Lower back surgery 06/2009 hernia surgery 10/2009  Family History: Reviewed history from 10/30/2007 and no changes required. Prostate Cancer father died from this  Social History: Reviewed history from 10/30/2007 and no changes required. Patient states former smoker.   Review of Systems       The patient complains of shortness of breath with activity, non-productive cough, coughing up blood, nasal congestion/difficulty breathing through nose, and change in color of mucus.  The patient denies shortness  of breath at rest, productive cough, chest pain, irregular heartbeats, acid heartburn, indigestion, loss of appetite, weight change, abdominal pain, difficulty swallowing, sore throat, tooth/dental problems, headaches, sneezing, itching, ear ache, anxiety, depression, hand/feet swelling, joint stiffness or pain, rash, and fever.    Vital Signs:  Patient profile:   65 year old male Height:      68 inches Weight:      150.38 pounds BMI:     22.95 O2 Sat:      97 % on Room air Temp:     98.4 degrees F oral Pulse rate:   87 / minute BP sitting:   152 / 82  (right arm) Cuff size:   regular  Vitals Entered By: Gweneth Dimitri RN (April 11, 2010 2:56 PM)  O2 Flow:  Room air CC: Acute Visit.  c/o head and chest congestion, prod cough with clear to yellow mucus, wheezing, mild chest tightness x couple of days.  Denies increased SOB and f/c/s. Comments Medications reviewed with patient Daytime contact number verified with patient. Gweneth Dimitri RN  April 11, 2010 2:56 PM    Physical Exam  Additional Exam:  Gen: WD WN   WM       in NAD    NCAT Heent:  no jvd, no TMG, no cervical LNademopathy, orophyx clear,  nares with purulent drainage and post nasal drip with purulence seen Cor: RRR nl s1/s2  no s3/s4  no m r h g Abd: soft NT BSA   no masses  No HSM  no rebound or guarding Ext perfused with no c v e v.d Neuro: intact, moves all 4s, CN II-XII intact, DTRs intact Chest: distant BS  , prominent pseudowheeze persists Skin: clear  Genital/Rectal :deferred    Impression & Recommendations:  Problem # 1:  OTHER ACUTE SINUSITIS (ICD-461.8) Assessment Deteriorated Acute sinusitis and bronchitis plan: avelox for 7 days pulse prednisone  for 8days neilmed sinus rinse cont nasocort His updated medication list for this problem includes:    Nasacort Aq 55 Mcg/act Aers (Triamcinolone acetonide(nasal)) .[name]..[name] Two puff once daily ea nostirl    Mucinex 600 Mg Xr12h-tab (Guaifenesin) .[name]..[name] Two  times a day    Nyquil 60-7.01-29-999 Mg/38ml Liqd (Pseudoeph-doxylamine-dm-apap) .[name]... As needed    Avelox 400 Mg Tabs (Moxifloxacin hcl) ..... By mouth daily  Orders: Est. Patient Level IV ([address])  Medications Added to Medication List This Visit: 1)  Nyquil 60-7.01-29-999 Mg/43ml Liqd (Pseudoeph-doxylamine-dm-apap) .... As needed 2)  Avelox 400 Mg Tabs (Moxifloxacin hcl) .... By mouth daily 3)  Prednisone 10 Mg Tabs (Prednisone) .... Take as directed take 4 daily for two days, then 3 daily for two days, then two daily for two days then one daily for two days then stop  Complete Medication List: 1)  Nasacort Aq 55 Mcg/act Aers (Triamcinolone acetonide(nasal)) .... Two puff once daily ea nostirl 2)  Amlodipine Besylate 10 Mg Tabs (Amlodipine besylate) .... Once daily 3)  Pulmicort Flexhaler 180 Mcg/act Inha (Budesonide (inhalation)) .... Two  puffs once daily 4)  Proair Hfa 108 (90 Base) Mcg/act  Aers (Albuterol sulfate) .[name].. 1-2 puffs every 4-6 hours as needed 5)  Diclofenac Sodium 75 Mg Tbec (Diclofenac sodium) .[name].. 1 by mouth two times a day 6)  Mucinex 600 Mg Xr12h-tab (Guaifenesin) .... Two times a day 7)  Tums E-x 750 750 Mg Chew (Calcium carbonate antacid) .... Two times a day 8)  Glucosamine-chondroitin 500-400 Mg Caps (Glucosamine-chondroitin) .... Two times a day 9)  Vitamin E 400 Unit Caps (Vitamin e) .... Once daily 10)  Multivitamins Tabs (Multiple vitamin) .... Three times weekly 11)  Nyquil 60-7.01-29-999 Mg/47ml Liqd (Pseudoeph-doxylamine-dm-apap) .... As needed 12)  Avelox 400 Mg Tabs (Moxifloxacin hcl) .... By mouth daily 13)  Prednisone 10 Mg Tabs (Prednisone) .... Take as directed take 4 daily for two days, then 3 daily for two days, then two daily for two days then one daily for two days then stop  Patient Instructions: 1)  Avelox one daily for 7days  2)  Prednisone 10mg  Take 4 daily for two days, then 3 daily for two days, then two daily for two days then one daily  for two days then stop 3)  Increase pulmicort to two puff twice daily for 10days then reduce to once daily two puffs 4)  Lloyd Huger Med sinus rinse twice daily for 10days 5)  Return 1 month for recheck Prescriptions: PREDNISONE 10 MG  TABS (PREDNISONE) Take as directed Take 4 daily for two days, then 3 daily for two days, then two daily for two days then one daily for two days then stop  #20 x 0   Entered and Authorized by:   Storm Frisk MD   Signed by:   Storm Frisk MD on 04/11/2010   Method used:   Electronically to        [facility]  [address] (retail)       [address]       [place], [place]  [address]       Ph: [phone]       Fax: [phone]   RxID:   [id number] AVELOX 400 MG  TABS (MOXIFLOXACIN HCL) By mouth daily  #5 x 0   Entered and Authorized by:   Storm Frisk MD   Signed by:   Storm Frisk MD on 04/11/2010   Method used:   Electronically to        [facility]  [address] (retail)       [address]       [place], [place]  [address]       Ph: [phone]       Fax: [phone]   RxID:   [id number]   Appended Document: Pulmonary OV fax Wyvonnia Lora

## 2010-10-04 NOTE — Progress Notes (Signed)
  Phone Note Other Incoming   Request: Send information Summary of Call: Request for records received from [employer] x2.  Request forwarded to [facility]. Artist Pais )     Appended Document:  Request for records received from [employer]. Request forwarded to [facility].

## 2010-10-04 NOTE — Assessment & Plan Note (Signed)
Summary: Pulmonary OV   Primary Provider/Referring Provider:  Wyvonnia Lora  CC:  1 month follow up.  Pt states sinuses are better but c/o runny nose with clear drainage.  States he does have SOB with exertion but this is at baseline.  Denies wheezing, chest tightness, and cough.  Requesting flu vac today.Marland Kitchen  History of Present Illness: Pulmonary OV :     This is a 65 year old, white male, history of chronic obstructive airways disease, asthmatic bronchitic component.      April 11, 2010 3:20 PM The pt was with a friend who had PNA. The pt also had other sick exposures over the past two weeks.  The pt   now notes the sinuses  are congested, now notes some cough, mucus is clear to yellow, mucus out of nose is pink.  No real sinus pressure, just full sinuses and pndrip. No f/c/s.  Dyspnea is same.  No chest pain.  Feels tired only.   Pt denies any significant sore throat, fever, chills, sweats, unintended weight loss, pleurtic or exertional chest pain, orthopnea PND, or leg swelling   05/15/10 Pt doing well without any complaints.  No active cough or wheeze.  Back on once daily pulmicort Pt denies any significant sore throat, nasal congestion or excess secretions, fever, chills, sweats, unintended weight loss, pleurtic or exertional chest pain, orthopnea PND, or leg swelling Pt denies any increase in rescue therapy over baseline, denies waking up needing it or having any early am or nocturnal exacerbations of coughing/wheezing/or dyspnea.    Preventive Screening-Counseling & Management  Alcohol-Tobacco     Smoking Status: quit > 6 months     Year Quit: 2002     Pack years: 47  Current Medications (verified): 1)  Nasacort Aq 55 Mcg/act  Aers (Triamcinolone Acetonide(Nasal)) .... Two Puff Once Daily Ea Nostirl 2)  Amlodipine Besylate 10 Mg Tabs (Amlodipine Besylate) .... 1/2 Tab Once Daily 3)  Pulmicort Flexhaler 180 Mcg/act  Inha (Budesonide (Inhalation)) .... Two  Puffs Once Daily 4)   Proair Hfa 108 (90 Base) Mcg/act  Aers (Albuterol Sulfate) .Marland Kitchen.. 1-2 Puffs Every 4-6 Hours As Needed 5)  Diclofenac Sodium 75 Mg Tbec (Diclofenac Sodium) .Marland Kitchen.. 1 By Mouth Two Times A Day 6)  Mucinex 600 Mg Xr12h-Tab (Guaifenesin) .... Two Times A Day 7)  Tums E-X 750 750 Mg Chew (Calcium Carbonate Antacid) .... Two Times A Day 8)  Glucosamine-Chondroitin 500-400 Mg Caps (Glucosamine-Chondroitin) .... Two Times A Day 9)  Vitamin E 400 Unit Caps (Vitamin E) .... Once Daily 10)  Multivitamins  Tabs (Multiple Vitamin) .... Three Times Weekly 11)  Nyquil 60-7.01-29-999 Mg/28ml Liqd (Pseudoeph-Doxylamine-Dm-Apap) .... As Needed  Allergies (verified): No Known Drug Allergies  Past History:  Past medical, surgical, family and social histories (including risk factors) reviewed, and no changes noted (except as noted below).  Past Medical History: Reviewed history from 04/27/2008 and no changes required. Allergic rhinitis COPD    -FeV1 64% 2007 GERD Hx lung abscess/bronchiectasis with RML and RLL resection   Past Surgical History: Reviewed history from 04/11/2010 and no changes required. RML and RUL removed.   due to bleeding and bronchiectasis  and lung abscess  Neck Surgery 05/2008 lower back surgery 09/2008 Lower back surgery 06/2009 hernia surgery 10/2009  Family History: Reviewed history from 10/30/2007 and no changes required. Prostate Cancer father died from this  Social History: Reviewed history from 10/30/2007 and no changes required. Patient states former smoker. 1 1/2 ppd x 38 yrs  Quit  in Jan 2002  Review of Systems  The patient denies shortness of breath with activity, shortness of breath at rest, productive cough, non-productive cough, coughing up blood, chest pain, irregular heartbeats, acid heartburn, indigestion, loss of appetite, weight change, abdominal pain, difficulty swallowing, sore throat, tooth/dental problems, headaches, nasal congestion/difficulty breathing  through nose, sneezing, itching, ear ache, anxiety, depression, hand/feet swelling, joint stiffness or pain, rash, change in color of mucus, and fever.    Vital Signs:  Patient profile:   65 year old male Height:      68 inches Weight:      148 pounds BMI:     22.58 O2 Sat:      97 % on Room air Temp:     98.1 degrees F oral Pulse rate:   79 / minute BP sitting:   140 / 80  (left arm) Cuff size:   regular  Vitals Entered By: Gweneth Dimitri RN (May 15, 2010 10:26 AM)  O2 Flow:  Room air CC: 1 month follow up.  Pt states sinuses are better but c/o runny nose with clear drainage.  States he does have SOB with exertion but this is at baseline.  Denies wheezing, chest tightness, cough.  Requesting flu vac today. Comments Medications reviewed with patient Daytime contact number verified with patient. Gweneth Dimitri RN  May 15, 2010 10:26 AM     Physical Exam  Additional Exam:  Gen: WD WN   WM       in NAD    NCAT Heent:  no jvd, no TMG, no cervical LNademopathy, orophyx clear,  nares with purulent drainage and post nasal drip with purulence seen Cor: RRR nl s1/s2  no s3/s4  no m r h g Abd: soft NT BSA   no masses  No HSM  no rebound or guarding Ext perfused with no c v e v.d Neuro: intact, moves all 4s, CN II-XII intact, DTRs intact Chest: distant BS  , prominent pseudowheeze persists Skin: clear  Genital/Rectal :deferred    Impression & Recommendations:  Problem # 1:  COPD (ICD-496) Assessment Unchanged copd with upper airway obstruction due to prior cspine surgery made worse with flovent hfa  and not doing well on qvar better with pulmicort flexihaler  plan wean pulmicort two puff s daily   Medications Added to Medication List This Visit: 1)  Amlodipine Besylate 10 Mg Tabs (Amlodipine besylate) .... 1/2 tab once daily  Complete Medication List: 1)  Nasacort Aq 55 Mcg/act Aers (Triamcinolone acetonide(nasal)) .... Two puff once daily ea nostirl 2)   Amlodipine Besylate 10 Mg Tabs (Amlodipine besylate) .... 1/2 tab once daily 3)  Pulmicort Flexhaler 180 Mcg/act Inha (Budesonide (inhalation)) .... Two  puffs once daily 4)  Proair Hfa 108 (90 Base) Mcg/act Aers (Albuterol sulfate) .Marland Kitchen.. 1-2 puffs every 4-6 hours as needed 5)  Diclofenac Sodium 75 Mg Tbec (Diclofenac sodium) .Marland Kitchen.. 1 by mouth two times a day 6)  Mucinex 600 Mg Xr12h-tab (Guaifenesin) .... Two times a day 7)  Tums E-x 750 750 Mg Chew (Calcium carbonate antacid) .... Two times a day 8)  Glucosamine-chondroitin 500-400 Mg Caps (Glucosamine-chondroitin) .... Two times a day 9)  Vitamin E 400 Unit Caps (Vitamin e) .... Once daily 10)  Multivitamins Tabs (Multiple vitamin) .... Three times weekly 11)  Nyquil 60-7.01-29-999 Mg/31ml Liqd (Pseudoeph-doxylamine-dm-apap) .... As needed  Other Orders: Est. Patient Level III ([address]) Admin 1st Vaccine ([address]) Flu Vaccine 66yrs + ([address]) Primary Care Referral (Primary)  Patient Instructions: 1)  No change in medications 2)  Return in    4-5      months [place] office 3)  Flu vaccine today 4)  We will refer you to Dr Artist Pais to establish as primary care   Prevention & Chronic Care Immunizations   Influenza vaccine: Fluvax 3+  (05/15/2010)    Tetanus booster: Not documented    Pneumococcal vaccine: Pneumovax  (07/03/2001)    H. zoster vaccine: Not documented  Colorectal Screening   Hemoccult: Not documented    Colonoscopy: Not documented  Other Screening   PSA: Not documented   Smoking status: quit > 6 months  (05/15/2010)  Lipids   Total Cholesterol: Not documented   LDL: Not documented   LDL Direct: Not documented   HDL: Not documented   Triglycerides: Not documented  Hypertension   Last Blood Pressure: 140 / 80  (05/15/2010)   Serum creatinine: Not documented   Serum potassium Not documented  Self-Management Support :    Hypertension self-management support: Not documented     Flu Vaccine Consent  Questions     Do you have a history of severe allergic reactions to this vaccine? no    Any prior history of allergic reactions to egg and/or gelatin? no    Do you have a sensitivity to the preservative Thimersol? no    Do you have a past history of Guillan-Barre Syndrome? no    Do you currently have an acute febrile illness? no    Have you ever had a severe reaction to latex? no    Vaccine information given and explained to patient? yes    Are you currently pregnant? no    Lot Number:[id number]   Exp Date:03/02/2011   Site Given  Left Deltoid IMbflu Gweneth Dimitri RN  May 15, 2010 10:57 AM

## 2010-10-04 NOTE — Assessment & Plan Note (Signed)
Summary: Pulmonary OV   Primary Provider/Referring Provider:  Wyvonnia Lora  CC:  4 month follow up.  Pt states breathing is doing "good" but does c/o of " breathing being loud."  denies cough..  History of Present Illness: Pulmonary OV :     This is a 65 year old, white male, history of chronic obstructive airways disease, asthmatic bronchitic component.      August 12, 2008 10:35 AM Had neck surgery and did well.  No recent resp issues.  Had seasonal flu  and h1n1 vaccine already. Pt denies any significant sore throat, nasal congestion or excess secretions, fever, chills, sweats, unintended weight loss, pleurtic or exertional chest pain, orthopnea PND, or leg swelling Pt denies any increase in rescue therapy over baseline, denies waking up needing it or having any early am or nocturnal exacerbations of coughing/wheezing/or dyspnea.   December 07, 2009 12:04 PM Had neck surgery 9/09.    Not able to obtain pulmicort now,  now on flovent Then had lower back surgery, 09/2008.    Then july 2010: tried to resume normal activity and had disc rupture and had to go back to another work in lower back 10/10.  then hernia and this repaired 2/11.   switched to flovent from pulmicort   (was out)   Preventive Screening-Counseling & Management  Alcohol-Tobacco     Smoking Status: quit > 6 months  Current Medications (verified): 1)  Nasacort Aq 55 Mcg/act  Aers (Triamcinolone Acetonide(Nasal)) .... Two Puff Once Daily Ea Nostirl 2)  Amlodipine Besylate 10 Mg Tabs (Amlodipine Besylate) .... Once Daily 3)  Flovent Hfa 110 Mcg/act Aero (Fluticasone Propionate  Hfa) .... 2 Puffs Once Daily 4)  Proair Hfa 108 (90 Base) Mcg/act  Aers (Albuterol Sulfate) .Marland Kitchen.. 1-2 Puffs Every 4-6 Hours As Needed 5)  Diclofenac Sodium 75 Mg Tbec (Diclofenac Sodium) .Marland Kitchen.. 1 By Mouth Two Times A Day 6)  Mucinex 600 Mg Xr12h-Tab (Guaifenesin) .... Two Times A Day 7)  Tums E-X 750 750 Mg Chew (Calcium Carbonate Antacid) ....  Two Times A Day 8)  Glucosamine-Chondroitin 500-400 Mg Caps (Glucosamine-Chondroitin) .... Two Times A Day 9)  Vitamin E 400 Unit Caps (Vitamin E) .... Once Daily  Allergies (verified): No Known Drug Allergies  Past History:  Past medical, surgical, family and social histories (including risk factors) reviewed, and no changes noted (except as noted below).  Past Medical History: Reviewed history from 04/27/2008 and no changes required. Allergic rhinitis COPD    -FeV1 64% 2007 GERD Hx lung abscess/bronchiectasis with RML and RLL resection   Past Surgical History: RML and RUL removed.   due to bleeding and bronchiectasis  and lung abscess   Neck Surgery 05/2008 lower back surgery 09/2008 Lower back surgery 06/2009 hernia surgery 10/2009  Family History: Reviewed history from 10/30/2007 and no changes required. Prostate Cancer father died from this  Social History: Reviewed history from 10/30/2007 and no changes required. Patient states former smoker.  Smoking Status:  quit > 6 months  Review of Systems       The patient complains of shortness of breath with activity.  The patient denies shortness of breath at rest, productive cough, non-productive cough, coughing up blood, chest pain, irregular heartbeats, acid heartburn, indigestion, loss of appetite, weight change, abdominal pain, difficulty swallowing, sore throat, tooth/dental problems, headaches, nasal congestion/difficulty breathing through nose, sneezing, itching, ear ache, anxiety, depression, hand/feet swelling, joint stiffness or pain, rash, change in color of mucus, and fever.    Vital Signs:  Patient profile:   65 year old male Height:      68 inches Weight:      153 pounds BMI:     23.35 O2 Sat:      96 % on Room air Temp:     98.5 degrees F oral Pulse rate:   82 / minute BP sitting:   156 / 84  (left arm) Cuff size:   regular  Vitals Entered By: Gweneth Dimitri RN (December 07, 2009 11:53 AM)  O2 Flow:  Room  air CC: 4 month follow up.  Pt states breathing is doing "good" but does c/o of " breathing being loud."  denies cough. Comments Medications reviewed with patient Daytime contact number verified with patient. Crystal Jones RN  December 07, 2009 11:54 AM    Physical Exam  Additional Exam:  Gen: WD WN   WM       in NAD    NCAT Heent:  no jvd, no TMG, no cervical LNademopathy, orophyx clear,  nares with clear watery drainage. Cor: RRR nl s1/s2  no s3/s4  no m r h g Abd: soft NT BSA   no masses  No HSM  no rebound or guarding Ext perfused with no c v e v.d Neuro: intact, moves all 4s, CN II-XII intact, DTRs intact Chest: distant BS  no wheezes, rales, rhonchi   no egophony  no consolidative breath sounds, mild hyperresonance to percussion, prominant pseudowheeze Skin: clear  Genital/Rectal :deferred    Impression & Recommendations:  Problem # 1:  COPD (ICD-496) Assessment Unchanged copd with upper airway obstruction due to prior cspine surgery made worse with flovent hfa  plan Qvar two puff twice daily,  use with aerochamber Stop Flovent Proair as needed Return two months [place]  Medications Added to Medication List This Visit: 1)  Amlodipine Besylate 10 Mg Tabs (Amlodipine besylate) .... Once daily 2)  Flovent Hfa 110 Mcg/act Aero (Fluticasone propionate  hfa) .... 2 puffs once daily 3)  Qvar 40 Mcg/act Aers (Beclomethasone dipropionate) .... Two puffs twice daily use with aerochamber 4)  Mucinex 600 Mg Xr12h-tab (Guaifenesin) .... Two times a day 5)  Tums E-x 750 750 Mg Chew (Calcium carbonate antacid) .... Two times a day 6)  Glucosamine-chondroitin 500-400 Mg Caps (Glucosamine-chondroitin) .... Two times a day 7)  Vitamin E 400 Unit Caps (Vitamin e) .... Once daily 8)  Aerochamber Mv Misc (Spacer/aero-holding chambers) .... Use with spacer  Complete Medication List: 1)  Nasacort Aq 55 Mcg/act Aers (Triamcinolone acetonide(nasal)) .... Two puff once daily ea nostirl 2)   Amlodipine Besylate 10 Mg Tabs (Amlodipine besylate) .... Once daily 3)  Qvar 40 Mcg/act Aers (Beclomethasone dipropionate) .... Two puffs twice daily use with aerochamber 4)  Proair Hfa 108 (90 Base) Mcg/act Aers (Albuterol sulfate) .Marland Kitchen.. 1-2 puffs every 4-6 hours as needed 5)  Diclofenac Sodium 75 Mg Tbec (Diclofenac sodium) .Marland Kitchen.. 1 by mouth two times a day 6)  Mucinex 600 Mg Xr12h-tab (Guaifenesin) .... Two times a day 7)  Tums E-x 750 750 Mg Chew (Calcium carbonate antacid) .... Two times a day 8)  Glucosamine-chondroitin 500-400 Mg Caps (Glucosamine-chondroitin) .... Two times a day 9)  Vitamin E 400 Unit Caps (Vitamin e) .... Once daily 10)  Aerochamber Mv Misc (Spacer/aero-holding chambers) .... Use with spacer  Patient Instructions: 1)  Qvar two puff twice daily,  use with aerochamber 2)  Stop Flovent 3)  Proair as needed 4)  Return two months [place]  Prescriptions: QVAR 40 MCG/ACT  AERS (BECLOMETHASONE DIPROPIONATE) Two puffs twice daily Use with aerochamber  #1 x 6   Entered and Authorized by:   Storm Frisk MD   Signed by:   Storm Frisk MD on 12/07/2009   Method used:   Electronically to        [facility]  [phone]* (retail)       [address]       [place], [place]  [address]       Ph: [phone]       Fax: [phone]   RxID:   [id number] AEROCHAMBER MV  MISC (SPACER/AERO-HOLDING CHAMBERS) Use with spacer  #1 x 0   Entered and Authorized by:   Storm Frisk MD   Signed by:   Storm Frisk MD on 12/07/2009   Method used:   Print then Give to Patient   RxID:   [id number] QVAR 40 MCG/ACT  AERS (BECLOMETHASONE DIPROPIONATE) Two puffs twice daily Use with aerochamber  #1 x 6   Entered and Authorized by:   Storm Frisk MD   Signed by:   Storm Frisk MD on 12/07/2009   Method used:   Electronically to        [facility]* (retail)       [address]       [place], [place]  [address]       Ph: [phone]       Fax:  [phone]   RxID:   [id number]  called [facility], spoke with Morrie Sheldon, informed her qvar was sent in error to pls disregard this.  She verbalized understanding.  Gweneth Dimitri RN  December 07, 2009 12:35 PM  Appended Document: Pulmonary OV fax Wyvonnia Lora

## 2010-10-04 NOTE — Miscellaneous (Signed)
Summary: Consent to Special Procedure-Mole Removal  Consent to Special Procedure-Mole Removal   Imported By: Maryln Gottron 06/29/2010 09:43:52  _____________________________________________________________________  External Attachment:    Type:   Image     Comment:   External Document

## 2010-10-04 NOTE — Progress Notes (Signed)
Summary: FYI  Phone Note Call from Patient Call back at [facility] Phone [phone]   Caller: Patient Call For: Jalaila Caradonna Summary of Call: F-Y-I on Neck Surgery that is being done Monday, Sept. 21, 2009 at [facility].  Pt said Dr. Delford Field wanted to know. Initial call taken by: Eugene Gavia,  May 17, 2008 9:05 AM  Follow-up for Phone Call        noted  pw

## 2010-10-04 NOTE — Consult Note (Signed)
Summary: [facility]  [facility]   Imported By: Esmeralda Links D'jimraou 05/24/2008 14:42:43  _____________________________________________________________________  External Attachment:    Type:   Image     Comment:   External Document

## 2010-10-04 NOTE — Assessment & Plan Note (Signed)
Summary: Pulmonary OV   PCP:  Wyvonnia Lora  Chief Complaint:  Surgical clearance for neck/back surgery.Marland Kitchen  History of Present Illness: Pulmonary OV :  preop clearance for neck and back surgery  per Dr Phoebe Perch  2/09:  This is a 65 year old, white male, history of chronic obstructive airways disease, asthmatic bronchitic component.    He said no active respiratory complaints.  There is no mucus production.  He has minimal postnasal sinus drainage.  The Nasacort helped [place] the past, but he is out of the Nasacort.  Overall, the patient is markedly improved from his previous evaluations.  Patient returns for pulmonary follow-up.  8/25:  Now needs surgery on  lower back lumbar and  cervical spine. May need anterior approach to neck.  Has obstructive lung disease and bronchtiectasis with RML and RLL resection for infection and bleeding in the past.  Pt is at baseline now.  Pt denies any dyspnea or cough change. Note the pt is on Lisinopril.       Prior Medications Reviewed Using: Patient Recall  Current Allergies (reviewed today): No known allergies   Past Medical History:    Reviewed history from 07/07/2007 and no changes required:       Allergic rhinitis       COPD          -FeV1 64% 2007       GERD       Hx lung abscess/bronchiectasis with RML and RLL resection   Past Surgical History:    Reviewed history and no changes required:       RML and RUL removed.         due to bleeding and bronchiectasis  and lung abscess      Review of Systems      See HPI   Vital Signs:  Patient Profile:   65 Years Old Male Weight:      149.6 pounds O2 Sat:      94 % O2 treatment:    Room Air Temp:     97.7 degrees F oral Pulse rate:   86 / minute BP sitting:   104 / 62  (left arm) Cuff size:   regular  Vitals Entered By: Michel Bickers CMA (April 27, 2008 9:52 AM)                 Physical Exam  Gen: WD WN   WM       in NAD    NCAT Heent:  no jvd, no TMG, no cervical  LNademopathy, orophyx clear,  nares with clear watery drainage. Cor: RRR nl s1/s2  no s3/s4  no m r h g Abd: soft NT BSA   no masses  No HSM  no rebound or guarding Ext perfused with no c v e v.d Neuro: intact, moves all 4s, CN II-XII intact, DTRs intact Chest: distant BS  no wheezes, rales, rhonchi   no egophony  no consolidative breath sounds, mild hyperresonance to percussion Skin: clear  Genital/Rectal :deferred     Pulmonary Function Test Date: 04/27/2008 Gender: Male  Pre-Spirometry FVC    Value: 3.43 L/min   Pred: 3.75 L/min     % Pred: 91 % FEV1    Value: 1.94 L     Pred: 3.01 L     % Pred: 64 % FEV1/FVC  Value: 56 %     Pred: 69 %    FEF 25-75  Value: 0.95 L/min   Pred: 3.11 L/min     %  Pred: 30 %  Comments: Moderate obstructive defect    Impression & Recommendations:  Problem # 1:  COPD (ICD-496)  Chronic obstructive lung  disease with asthmatic bronchitic component stable at this time. Hx of bronchiectasis Plan maintain inhaled medications as currently prescribed without change The pt may be cleared for surgery on lumbar and cervical spine.  He should stop lisinopril at least two weeks prior to cervical spine surgery. I will communicate this with Dr Margo Common and Phoebe Perch  Medications Added to Medication List This Visit: 1)  Diclofenac Sodium 75 Mg Tbec (Diclofenac sodium) .Marland Kitchen.. 1 by mouth two times a day 2)  Mucus Relief 400 Mg Tabs (Guaifenesin) .... 2 by mouth two times a day  Complete Medication List: 1)  Nasacort Aq 55 Mcg/act Aers (Triamcinolone acetonide(nasal)) .... Two puff once daily ea nostirl 2)  Lisinopril-hydrochlorothiazide 20-12.5 Mg Tabs (Lisinopril-hydrochlorothiazide) .Marland Kitchen.. 1 by mouth daily 3)  Pulmicort Flexhaler 180 Mcg/act Inha (Budesonide (inhalation)) .... Two puff once a day 4)  Proair Hfa 108 (90 Base) Mcg/act Aers (Albuterol sulfate) .Marland Kitchen.. 1-2 puffs every 4-6 hours as needed 5)  Diclofenac Sodium 75 Mg Tbec (Diclofenac sodium) .Marland Kitchen.. 1 by mouth  two times a day 6)  Mucus Relief 400 Mg Tabs (Guaifenesin) .... 2 by mouth two times a day   Patient Instructions: 1)  Ok for surgery 2)  May need to stop lisinopril prior to neck surgery.  I will let your surgeon and primary care MD know about this issue. 3)  No other medication changes 4)  Let us know when you are admitted for surgery 5)  Return in 4 months   ]  Appended Document: Pulmonary OV Fax to Wyvonnia Lora and Dr Colon Branch of neurosurgery

## 2010-10-04 NOTE — Assessment & Plan Note (Signed)
Summary: new to be est [facility] ref from Dr Joella Prince   Vital Signs:  Patient profile:   65 year old male Height:      67.5 inches Weight:      147.50 pounds BMI:     22.84 O2 Sat:      97 % on Room air Temp:     97.8 degrees F oral Pulse rate:   75 / minute Pulse rhythm:   irregular Resp:     18 per minute BP sitting:   150 / 90  (left arm) Cuff size:   regular  Vitals Entered By: Glendell Docker CMA (May 23, 2010 9:50 AM)  O2 Flow:  Room air CC: New Patient Is Patient Diabetic? No Pain Assessment Patient in pain? no      Comments establish care, seen in ER in [facility] over the weekend for severe back pain. Evaluationof moles on back    Primary Care Provider:  D. Thomos Lemons DO  CC:  New Patient.  History of Present Illness: 65 y/o male to establish prev PCP Dr. Wyvonnia Lora in [place]  hx of htn: faint / dizziness, joint numbness with 10 mg of amlodipine  no hx CAD or CVA.  no chest pains  8/28 - seen in [place] for acute low back pain better with pain meds  Current Diet: Breakfast: eggs, sausage Lunch:  light lunch DInner: heavier dinner Snacks: Beverage:      Preventive Screening-Counseling & Management  Alcohol-Tobacco     Alcohol drinks/day: <1     Alcohol type: beer     Smoking Status: quit     Packs/Day: 1.5     Year Started: 1965     Year Quit: 2002  Caffeine-Diet-Exercise     Caffeine use/day: 3-4 beverages daily     Does Patient Exercise: yes     Times/week: 7  Allergies (verified): No Known Drug Allergies  Past History:  Past Medical History: Allergic rhinitis COPD    -FeV1 64% 2007 GERD Hx lung abscess/bronchiectasis with RML and RLL resection - 1996 (Dr. Edwyna Shell) Hypertension  Family History: Prostate Cancer father died from this  (age 37) Family History of Arthritis Family History of CAD Male 1st degree relative <60 Family History Hypertension Family History of Stroke M 1st degree relative <50 mother died  of CHF - age 22  Social History: Retired Librarian, academic Patient states former smoker. 1 1/2 ppd x 38 yrs  Quit in Jan 2002 Married - 2 weeks (4th marriage) divorced,  remarried 84-2004 (lost wife to lung ca),  remarried (divorced),  1 son  - 29 [name]) Alcohol use-yes (2-3 beers per week) Smoking Status:  quit Packs/Day:  1.5 Caffeine use/day:  3-4 beverages daily Does Patient Exercise:  yes  Review of Systems  The patient denies fever, weight loss, weight gain, chest pain, severe indigestion/heartburn, and depression.    Physical Exam  General:  alert, well-developed, and well-nourished.   Head:  normocephalic and atraumatic.   Mouth:  pharynx pink and moist.   Neck:  supple and no masses.   Lungs:  normal respiratory effort, no crackles, and no wheezes.   Heart:  normal rate, regular rhythm, and no gallop.   Abdomen:  soft, non-tender, normal bowel sounds, and no masses.   Extremities:  No lower extremity edema Neurologic:  cranial nerves II-XII intact and gait normal.     Impression & Recommendations:  Problem # 1:  HYPERTENSION (ICD-401.9) BP suboptimally controlled.  add ARB  His updated medication list for this problem includes:    Amlodipine Besylate 5 Mg Tabs (Amlodipine besylate) .Marland Kitchen... Take 1 tablet by mouth once a day    Losartan Potassium 50 Mg Tabs (Losartan potassium) ..... One by mouth once daily  Future Orders: T-Basic Metabolic Panel [id number]) ... 06/20/2010 T-Hepatic Function [id number]) ... 06/20/2010 T-Lipid Profile [id number]) ... 06/20/2010 T-CBC w/Diff ([id number]) ... 06/20/2010 T-TSH [id number]) ... 06/20/2010  BP today: 150/90 Prior BP: 140/80 (05/15/2010)  Problem # 2:  COPD (ICD-496) Assessment: Unchanged  His updated medication list for this problem includes:    Pulmicort Flexhaler 180 Mcg/act Inha (Budesonide (inhalation)) .Marland Kitchen..Marland Kitchen Two  puffs once daily    Proair Hfa 108 (90 Base) Mcg/act Aers (Albuterol sulfate)  .Marland Kitchen... 1-2 puffs every 4-6 hours as needed  Complete Medication List: 1)  Nasacort Aq 55 Mcg/act Aers (Triamcinolone acetonide(nasal)) .... Two puff once daily ea nostirl 2)  Amlodipine Besylate 5 Mg Tabs (Amlodipine besylate) .... Take 1 tablet by mouth once a day 3)  Pulmicort Flexhaler 180 Mcg/act Inha (Budesonide (inhalation)) .... Two  puffs once daily 4)  Proair Hfa 108 (90 Base) Mcg/act Aers (Albuterol sulfate) .Marland Kitchen.. 1-2 puffs every 4-6 hours as needed 5)  Diclofenac Sodium 75 Mg Tbec (Diclofenac sodium) .Marland Kitchen.. 1 by mouth two times a day 6)  Mucinex 600 Mg Xr12h-tab (Guaifenesin) .... Two times a day 7)  Tums E-x 750 750 Mg Chew (Calcium carbonate antacid) .... Two times a day 8)  Glucosamine-chondroitin 500-400 Mg Caps (Glucosamine-chondroitin) .... Two times a day 9)  Vitamin E 400 Unit Caps (Vitamin e) .... Once daily 10)  Multivitamins Tabs (Multiple vitamin) .... Three times weekly 11)  Losartan Potassium 50 Mg Tabs (Losartan potassium) .... One by mouth once daily  Other Orders: Future Orders: T-PSA ([id number]) ... 06/20/2010  Patient Instructions: 1)  Please schedule a follow-up appointment in 1 month. 2)  Monitor your blood pressure at home with automated cuff. 3)  Omron or Relion 4)  BMP prior to visit, ICD-9:  401.9 5)  Hepatic Panel prior to visit, ICD-9: 401.9 6)  Lipid Panel prior to visit, ICD-9: 401.9 7)  TSH prior to visit, ICD-9:  401.9 8)  CBC w/ Diff prior to visit, ICD-9: 401.9 9)  PSA:  V76.44 10)  Lab work to be completed at next office visit. Prescriptions: LOSARTAN POTASSIUM 50 MG TABS (LOSARTAN POTASSIUM) one by mouth once daily  #30 x 1   Entered and Authorized by:   D. Thomos Lemons DO   Signed by:   D. Thomos Lemons DO on 05/23/2010   Method used:   Electronically to        [facility]  [address] (retail)       [address]       [place], [place]  [address]       Ph: [phone]       Fax: [phone]   RxID:   [id number]   Current Allergies  (reviewed today): No known allergies    Immunization History:  Tetanus/Td Immunization History:    Tetanus/Td:  historical (05/15/2004)  Pneumovax Immunization History:    Pneumovax:  historical (05/13/2006)

## 2010-10-04 NOTE — Assessment & Plan Note (Signed)
Summary: Pulmonary OV   PCP:  Wyvonnia Lora  Chief Complaint:  4 mo COPD follow-up. Pt states no changes in his breathing.Marland Kitchen  History of Present Illness: Pulmonary OV :     This is a 65 year old, white male, history of chronic obstructive airways disease, asthmatic bronchitic component.      August 12, 2008 10:35 AM Had neck surgery and did well.  No recent resp issues.  Had seasonal flu  and h1n1 vaccine already. Pt denies any significant sore throat, nasal congestion or excess secretions, fever, chills, sweats, unintended weight loss, pleurtic or exertional chest pain, orthopnea PND, or leg swelling Pt denies any increase in rescue therapy over baseline, denies waking up needing it or having any early am or nocturnal exacerbations of coughing/wheezing/or dyspnea.      Prior Medications Reviewed Using: Patient Recall  Prior Medication List:  NASACORT AQ 55 MCG/ACT  AERS (TRIAMCINOLONE ACETONIDE(NASAL)) two puff once daily ea nostirl LISINOPRIL-HYDROCHLOROTHIAZIDE 20-12.5 MG  TABS (LISINOPRIL-HYDROCHLOROTHIAZIDE) 1 by mouth daily PULMICORT FLEXHALER 180 MCG/ACT  INHA (BUDESONIDE (INHALATION)) two puff once a day [BMN] PROAIR HFA 108 (90 BASE) MCG/ACT  AERS (ALBUTEROL SULFATE) 1-2 puffs every 4-6 hours as needed [BMN] DICLOFENAC SODIUM 75 MG TBEC (DICLOFENAC SODIUM) 1 by mouth two times a day MUCUS RELIEF 400 MG TABS (GUAIFENESIN) 2 by mouth two times a day   Current Allergies (reviewed today): No known allergies   Past Medical History:    Reviewed history from 04/27/2008 and no changes required:       Allergic rhinitis       COPD          -FeV1 64% 2007       GERD       Hx lung abscess/bronchiectasis with RML and RLL resection      Review of Systems  The patient denies anorexia, fever, hoarseness, chest pain, syncope, dyspnea on exertion, peripheral edema, prolonged cough, headaches, hemoptysis, abdominal pain, melena, severe indigestion/heartburn, incontinence,  enlarged lymph nodes, and angioedema.     Vital Signs:  Patient Profile:   65 Years Old Male Weight:      155.8 pounds O2 Sat:      98 % O2 treatment:    Room Air Temp:     98.0 degrees F oral Pulse rate:   89 / minute BP sitting:   124 / 76  (right arm) Cuff size:   regular  Vitals Entered By: Michel Bickers CMA (August 12, 2008 10:19 AM)                 Physical Exam  General:     well developed, well nourished, in no acute distress Head:     normocephalic and atraumatic Eyes:     PERRLA/EOM intact; conjunctiva and sclera clear Ears:     TMs intact and clear with normal canals Nose:     no deformity, discharge, inflammation, or lesions Mouth:     no deformity or lesions Neck:     no masses, thyromegaly, or abnormal cervical nodes Chest Wall:     no deformities noted Lungs:     decreased BS bilateral and prolonged exhilation.   Heart:     regular rate and rhythm, S1, S2 without murmurs, rubs, gallops, or clicks Abdomen:     bowel sounds positive; abdomen soft and non-tender without masses, or organomegaly Msk:     no deformity or scoliosis noted with normal posture Pulses:     pulses normal Extremities:  no clubbing, cyanosis, edema, or deformity noted Neurologic:     CN II-XII grossly intact with normal reflexes, coordination, muscle strength and tone Skin:     intact without lesions or rashes Cervical Nodes:     no significant adenopathy Axillary Nodes:     no significant adenopathy Psych:     alert and cooperative; normal mood and affect; normal attention span and concentration      Impression & Recommendations:  Problem # 1:  COPD (ICD-496) Assessment: Unchanged  Chronic obstructive lung  disease with asthmatic bronchitic component stable at this time. Hx of bronchiectasis Plan maintain inhaled medications as currently prescribed without change rov 5 months   Complete Medication List: 1)  Nasacort Aq 55 Mcg/act Aers (Triamcinolone  acetonide(nasal)) .... Two puff once daily ea nostirl 2)  Lisinopril-hydrochlorothiazide 20-12.5 Mg Tabs (Lisinopril-hydrochlorothiazide) .Marland Kitchen.. 1 by mouth daily 3)  Pulmicort Flexhaler 180 Mcg/act Inha (Budesonide (inhalation)) .... Two puff once a day 4)  Proair Hfa 108 (90 Base) Mcg/act Aers (Albuterol sulfate) .Marland Kitchen.. 1-2 puffs every 4-6 hours as needed 5)  Diclofenac Sodium 75 Mg Tbec (Diclofenac sodium) .Marland Kitchen.. 1 by mouth two times a day 6)  Mucus Relief 400 Mg Tabs (Guaifenesin) .... 2 by mouth two times a day   Patient Instructions: 1)  Return 4-5 months  2)  No change in medications    Prescriptions: PROAIR HFA 108 (90 BASE) MCG/ACT  AERS (ALBUTEROL SULFATE) 1-2 puffs every 4-6 hours as needed Brand medically necessary #1 x 6   Entered and Authorized by:   Storm Frisk MD   Signed by:   Storm Frisk MD on 08/12/2008   Method used:   Electronically to        [facility] (retail)       [address]       [place], [place]  [address]       Ph: [phone]       Fax: [phone]   RxID:   [id number] NASACORT AQ 55 MCG/ACT  AERS (TRIAMCINOLONE ACETONIDE(NASAL)) two puff once daily ea nostirl  #1 x 6   Entered and Authorized by:   Storm Frisk MD   Signed by:   Storm Frisk MD on 08/12/2008   Method used:   Electronically to        [facility] (retail)       [address]       [place], [place]  [address]       Ph: [phone]       Fax: [phone]   RxID:   [id number] PULMICORT FLEXHALER 180 MCG/ACT  INHA (BUDESONIDE (INHALATION)) two puff once a day Brand medically necessary #1 x 6   Entered and Authorized by:   Storm Frisk MD   Signed by:   Storm Frisk MD on 08/12/2008   Method used:   Electronically to        [facility] (retail)       [address]       [place], [place]  [address]       Ph: [phone]       Fax: [phone]   RxID:    [id number]  ]  Appended Document: Pulmonary OV fax david tapper

## 2010-10-04 NOTE — Assessment & Plan Note (Signed)
Summary: 2 month follow up/mhf   Vital Signs:  Patient profile:   65 year old male Height:      67.5 inches Weight:      152.25 pounds BMI:     23.58 O2 Sat:      98 % on Room air Temp:     97.8 degrees F oral Pulse rate:   90 / minute Resp:     18 per minute BP sitting:   142 / 80  (left arm) Cuff size:   regular  Vitals Entered By: Glendell Docker CMA (August 15, 2010 8:05 AM)  O2 Flow:  Room air CC: 2 Month Follow up Is Patient Diabetic? No Pain Assessment Patient in pain? no      Comments medication refill for Losartan , has some dizziness in the morning   Primary Care Provider:  DThomos Lemons DO  CC:  2 Month Follow up.  History of Present Illness:  Hypertension Follow-Up      This is a 65 year old man who presents for Hypertension follow-up.  The patient denies the following associated symptoms: chest pain.  Compliance with medications (by patient report) has been near 100%.  pt gets occ lightheaded in AM.  he has been monitoring bp at home - SBP in 130's  Preventative Health - had colonoscopy in 1996 due to rectal bleeding pt reports exam was normal he denies change in bowel habits   Preventive Screening-Counseling & Management  Alcohol-Tobacco     Smoking Status: quit  Allergies (verified): No Known Drug Allergies  Past History:  Past Medical History: Allergic rhinitis COPD    -FeV1 64% 2007  GERD  Hx lung abscess/bronchiectasis with RML and RLL resection - 1996 (Dr. Edwyna Shell) Hypertension Osteoarthritis (hands, knees)  Family History: Prostate Cancer father died from this  (age 51) Family History of Arthritis Family History of CAD Male 1st degree relative <60 Family History Hypertension Family History of Stroke M 1st degree relative <50 mother died of CHF - age 41     Social History: Retired Librarian, academic Patient states former smoker. 1 1/2 ppd x 38 yrs  Quit in Jan 2002 Married - 2 weeks (4th marriage) divorced,  remarried  84-2004 (lost wife to lung ca),  remarried (divorced),  1 son  - 31 [name]) Alcohol use-yes (2-3 beers per week)    Physical Exam  General:  alert, well-developed, and well-nourished.   Lungs:  normal respiratory effort and normal breath sounds.   Heart:  normal rate, regular rhythm, and no gallop.   Msk:  enlarged joint at bases of thumbs bilaterally   Impression & Recommendations:  Problem # 1:  HYPERTENSION (ICD-401.9)  His updated medication list for this problem includes:    Amlodipine Besylate 5 Mg Tabs (Amlodipine besylate) .Marland Kitchen... Take 1 tablet by mouth once a day    Losartan Potassium 100 Mg Tabs (Losartan potassium) ..... One by mouth once daily  Orders: T-Basic Metabolic Panel [phone])  Problem # 2:  PREVENTIVE HEALTH CARE (ICD-V70.0)  Orders: Gastroenterology Referral (GI)  Problem # 3:  DEGENERATIVE JOINT DISEASE (ICD-715.90) pt c/o intermittent joint pain of hands and knees he uses diclofenac switch to naproxen (less cardiac risk) trial of voltaren gel for hand arthrtis - samples provided  The following medications were removed from the medication list:    Diclofenac Sodium 75 Mg Tbec (Diclofenac sodium) .Marland Kitchen... 1 by mouth two times a day His updated medication list for this problem includes:  Naproxen 250 Mg Tabs (Naproxen) ..... One by mouth two times a day as needed  Complete Medication List: 1)  Nasacort Aq 55 Mcg/act Aers (Triamcinolone acetonide(nasal)) .... Two puff once daily ea nostirl 2)  Amlodipine Besylate 5 Mg Tabs (Amlodipine besylate) .... Take 1 tablet by mouth once a day 3)  Pulmicort Flexhaler 180 Mcg/act Inha (Budesonide (inhalation)) .... Two  puffs once daily 4)  Proair Hfa 108 (90 Base) Mcg/act Aers (Albuterol sulfate) .Marland Kitchen.. 1-2 puffs every 4-6 hours as needed 5)  Mucinex 600 Mg Xr12h-tab (Guaifenesin) .... Two times a day 6)  Tums E-x 750 750 Mg Chew (Calcium carbonate antacid) .... Two times a day 7)  Glucosamine-chondroitin  500-400 Mg Caps (Glucosamine-chondroitin) .... Two times a day 8)  Multivitamins Tabs (Multiple vitamin) .... Three times weekly 9)  Losartan Potassium 100 Mg Tabs (Losartan potassium) .... One by mouth once daily 10)  Naproxen 250 Mg Tabs (Naproxen) .... One by mouth two times a day as needed 11)  Voltaren 1 % Gel (Diclofenac sodium) .... Apply three times a day  Patient Instructions: 1)  Please schedule a follow-up appointment in 6 months. 2)  BMP prior to visit, ICD-9: 401.9 3)  Please return for lab work one (1) week before your next appointment.  Prescriptions: LOSARTAN POTASSIUM 100 MG TABS (LOSARTAN POTASSIUM) one by mouth once daily  #90 x 1   Entered and Authorized by:   D. Thomos Lemons DO   Signed by:   D. Thomos Lemons DO on 08/15/2010   Method used:   Electronically to        [facility]  [address] (retail)       [address]       [place], [place]  [address]       Ph: [phone]       Fax: [phone]   RxID:   [id number]    Orders Added: 1)  Gastroenterology Referral [GI] 2)  T-Basic Metabolic Panel [phone] 3)  Est. Patient Level III [[address]]     Current Allergies (reviewed today): No known allergies    Preventive Care Screening  Colonoscopy:    Date:  02/18/1995    Results:  normal

## 2010-10-04 NOTE — Progress Notes (Signed)
Summary: refills--nasacort, pulmicort  Phone Note Refill Request Message from:  Patient on June 27, 2010 9:48 AM  Refills Requested: Medication #1:  NASACORT AQ 55 MCG/ACT  AERS two puff once daily ea nostirl   Dosage confirmed as above?Dosage Confirmed   Supply Requested: 3 months  Medication #2:  PULMICORT FLEXHALER 180 MCG/ACT  INHA Two  puffs once daily   Dosage confirmed as above?Dosage Confirmed   Supply Requested: 3 months PT WOULD LIKE REFILLS SENT TO [facility].  Next Appointment Scheduled: none with Dr Delford Field Initial call taken by: Mervin Kung CMA Duncan Dull),  June 27, 2010 9:52 AM  Follow-up for Phone Call        3 month rx for nasacort and pulmicort sent to [facility]  [facility] Gweneth Dimitri RN  June 28, 2010 11:49 AM  pt advised.Carron Curie CMA  June 28, 2010 12:12 PM      Prescriptions: Steffanie Rainwater 180 MCG/ACT  INHA (BUDESONIDE (INHALATION)) Two  puffs once daily  #3 x 3   Entered by:   Gweneth Dimitri RN   Authorized by:   Storm Frisk MD   Signed by:   Gweneth Dimitri RN on 06/28/2010   Method used:   Faxed to ...       [facility] (mail-order)             , [place]         Ph: [phone]       Fax: [phone]   RxID:   [id number] NASACORT AQ 55 MCG/ACT  AERS (TRIAMCINOLONE ACETONIDE(NASAL)) two puff once daily ea nostirl  #3 x 3   Entered by:   Gweneth Dimitri RN   Authorized by:   Storm Frisk MD   Signed by:   Gweneth Dimitri RN on 06/28/2010   Method used:   Faxed to ...       [facility] (mail-order)             , [place]         Ph: [phone]       Fax: [phone]   RxID:   [id number]

## 2010-10-04 NOTE — Assessment & Plan Note (Signed)
Summary: Pulmonary OV  Medications Added LISINOPRIL-HYDROCHLOROTHIAZIDE 20-12.5 MG  TABS (LISINOPRIL-HYDROCHLOROTHIAZIDE) 1 by mouth daily PULMICORT FLEXHALER 180 MCG/ACT  INHA (BUDESONIDE (INHALATION)) two puff once a day [BMN] PROAIR HFA 108 (90 BASE) MCG/ACT  AERS (ALBUTEROL SULFATE) 1-2 puffs every 4-6 hours as needed [BMN] NASACORT AQ 55 MCG/ACT  AERS (TRIAMCINOLONE ACETONIDE(NASAL)) Two puffs each nostril daily [BMN]        PCP:  Wyvonnia Lora  Chief Complaint:  Follow-up.  History of Present Illness: This is a 65 year old, white male, history of chronic obstructive airways disease, asthmatic bronchitic component.  The patient is here for one year follow up.  He's only uses rescue inhaler once in the past 12 months.  He said no active respiratory complaints.  There is no mucus production.  He has minimal postnasal sinus drainage.  The Nasacort helped [place] the past, but he is out of the Nasacort.  Overall, the patient is markedly improved from his previous evaluations.  Patient returns for pulmonary follow-up.       Current Allergies: No known allergies   Past Medical History:    Reviewed history from 07/07/2007 and no changes required:       Allergic rhinitis       COPD       GERD   Family History:    Reviewed history and no changes required:       Prostate Cancer father died from this         Social History:    Reviewed history and no changes required:       Patient states former smoker.    Risk Factors:  Tobacco use:  quit    Year quit:  2002    Pack-years:  60   Review of Systems      See HPI   Vital Signs:  Patient Profile:   65 Years Old Male Weight:      156 pounds O2 Sat:      95 % O2 treatment:    Room Air Temp:     98.5 degrees F oral Pulse rate:   95 / minute BP sitting:   116 / 64  (left arm)  Vitals Entered By: Michel Bickers CMA (October 30, 2007 1:29 PM)             Comments Pt states no complaints. Medications reviewed.      Physical Exam  Gen: WD WN   WM       in NAD    NCAT Heent:  no jvd, no TMG, no cervical LNademopathy, orophyx clear,  nares with clear watery drainage. Cor: RRR nl s1/s2  no s3/s4  no m r h g Abd: soft NT BSA   no masses  No HSM  no rebound or guarding Ext perfused with no c v e v.d Neuro: intact, moves all 4s, CN II-XII intact, DTRs intact Chest: distant BS  no wheezes, rales, rhonchi   no egophony  no consolidative breath sounds, mild hyperresonance to percussion Skin: clear  Genital/Rectal :deferred      Impression & Recommendations:  Problem # 1:  COPD (ICD-496) Chronic obstructive lung  disease with asthmatic bronchitic component stable at this time. Plan maintain inhaled medications as currently prescribed without change His updated medication list for this problem includes:    Pulmicort Flexhaler 180 Mcg/act Inha (Budesonide (inhalation)) .Marland Kitchen..Marland Kitchen Two puff once a day    Proair Hfa 108 (90 Base) Mcg/act Aers (Albuterol sulfate) .Marland Kitchen... 1-2 puffs every 4-6 hours as needed  Orders: Est. Patient Level III (63[address])   Problem # 2:  ALLERGIC RHINITIS (ICD-477.9) Assessment: Deteriorated Allergic rhinitis with mild flare. Plan renew Nasacort, two inhalations each nostril daily His updated medication list for this problem includes:       Nasacort Aq 55 Mcg/act Aers (Triamcinolone acetonide(nasal)) .Marland Kitchen..Marland Kitchen Two puffs each nostril daily  Orders: Est. Patient Level III ([address]093)   Medications Added to Medication List This Visit: 1)  Lisinopril-hydrochlorothiazide 20-12.5 Mg Tabs (Lisinopril-hydrochlorothiazide) .Marland Kitchen.. 1 by mouth daily 2)  Pulmicort Flexhaler 180 Mcg/act Inha (Budesonide (inhalation)) .... Two puff once a day 3)  Proair Hfa 108 (90 Base) Mcg/act Aers (Albuterol sulfate) .Marland Kitchen.. 1-2 puffs every 4-6 hours as needed 4)  Nasacort Aq 55 Mcg/act Aers (Triamcinolone acetonide(nasal)) .... Two puffs each nostril daily  Complete Medication List: 1)  Nasacort Aq 55 Mcg/act Aers  (Triamcinolone acetonide(nasal)) .... Two puff once daily ea nostirl 2)  Lisinopril-hydrochlorothiazide 20-12.5 Mg Tabs (Lisinopril-hydrochlorothiazide) .Marland Kitchen.. 1 by mouth daily 3)  Pulmicort Flexhaler 180 Mcg/act Inha (Budesonide (inhalation)) .... Two puff once a day 4)  Proair Hfa 108 (90 Base) Mcg/act Aers (Albuterol sulfate) .Marland Kitchen.. 1-2 puffs every 4-6 hours as needed 5)  Nasacort Aq 55 Mcg/act Aers (Triamcinolone acetonide(nasal)) .... Two puffs each nostril daily   Patient Instructions: 1)  Please schedule a follow-up appointment in 1 year. 2)  No change in medications    Prescriptions: PROAIR HFA 108 (90 BASE) MCG/ACT  AERS (ALBUTEROL SULFATE) 1-2 puffs every 4-6 hours as needed Brand medically necessary #1 x 6   Entered and Authorized by:   Storm Frisk MD   Signed by:   Storm Frisk MD on 10/30/2007   Method used:   Electronically sent to ...       [facility]       [address]       [place], [place]  [address]       Ph: [phone]       Fax: [phone]   RxID:   [id number] NASACORT AQ 55 MCG/ACT  AERS (TRIAMCINOLONE ACETONIDE(NASAL)) Two puffs each nostril daily Brand medically necessary #1 x 6   Entered and Authorized by:   Storm Frisk MD   Signed by:   Storm Frisk MD on 10/30/2007   Method used:   Electronically sent to ...       [facility]       [address]       [place], [place]  [address]       Ph: [phone]       Fax: [phone]   RxID:   [id number] PULMICORT FLEXHALER 180 MCG/ACT  INHA (BUDESONIDE (INHALATION)) two puff once a day Brand medically necessary #1 x 6   Entered and Authorized by:   Storm Frisk MD   Signed by:   Storm Frisk MD on 10/30/2007   Method used:   Electronically sent to ...       [facility]       [address]       [place], [place]  [address]       Ph: [phone]       Fax: [phone]   RxID:    [id number]  ]

## 2010-10-04 NOTE — Progress Notes (Signed)
Summary: pt at home-FYI for dr Delford Field  Phone Note Call from Patient   Caller: Patient Call For: [name] Summary of Call: pt states that dr Delford Field was going to stop by at hospital today to see pt. pt has been discharged and is now home.  Initial call taken by: Tivis Ringer,  May 25, 2008 11:41 AM  Follow-up for Phone Call        noted and am glad he is home   Follow-up by: Storm Frisk MD,  May 25, 2008 6:17 PM

## 2010-10-04 NOTE — Progress Notes (Signed)
Summary: Arthritis Pain  Phone Note Call from Patient Call back at Home Phone [phone] Call back at [phone]   Caller: Patient Call For: D. Thomos Lemons DO Summary of Call: patient states that he is still having aches and pains, and the Aleve is not workimg. He states he has to do daily exercises for his back, and his arthritis pain is causing a sigmificant amount of pain. He would like to know if Dr Artist Pais would presribe something for pain. He is using the Voltaren Gel, however he states that he would have to bath in it in order to get some relief Initial call taken by: Glendell Docker CMA,  August 20, 2010 8:34 AM  Follow-up for Phone Call        call returned to patient at [phone], he has been informed of rx  Follow-up by: Glendell Docker CMA,  August 21, 2010 8:28 AM    New/Updated Medications: MELOXICAM 15 MG TABS (MELOXICAM) one by mouth once daily as needed Prescriptions: MELOXICAM 15 MG TABS (MELOXICAM) one by mouth once daily as needed  #30 x 3   Entered and Authorized by:   D. Thomos Lemons DO   Signed by:   D. Thomos Lemons DO on 08/20/2010   Method used:   Electronically to        [facility]  [address] (retail)       [address]       [place], [place]  [address]       Ph: [phone]       Fax: [phone]   RxID:   [id number]

## 2010-10-18 ENCOUNTER — Ambulatory Visit (INDEPENDENT_AMBULATORY_CARE_PROVIDER_SITE_OTHER): Payer: BC Managed Care – PPO | Admitting: Critical Care Medicine

## 2010-10-18 ENCOUNTER — Encounter: Payer: Self-pay | Admitting: Critical Care Medicine

## 2010-10-18 DIAGNOSIS — J449 Chronic obstructive pulmonary disease, unspecified: Secondary | ICD-10-CM

## 2010-10-24 NOTE — Assessment & Plan Note (Signed)
Summary: Pulmonary OV   Primary Provider/Referring Provider:  Dondra Spry DO  CC:  5 month follow up.  Pt states he is having increased difficulty breathing at times over the past 1-43months.  Seems to be worse when outside.  Some wheezing and slight cough with clear mucus.  Denies chest tightness.Marland Kitchen  History of Present Illness: Pulmonary OV :     This is a 65 year old, white male, history of chronic obstructive airways disease, asthmatic bronchitic component.      October 18, 2010 9:50 AM Living in a new house, lots of diesel traffic and will cause more dyspnea. Now: occ cough prod clear phlegm.  No real chest pain. Occ wheeze.  Dyspnea on exertion.  Occ pndrip.  Has mucus out of the nose.   Current Medications (verified): 1)  Nasacort Aq 55 Mcg/act  Aers (Triamcinolone Acetonide(Nasal)) .... Two Puff Once Daily Ea Nostirl 2)  Amlodipine Besylate 5 Mg Tabs (Amlodipine Besylate) .... Take 1 Tablet By Mouth Once A Day 3)  Pulmicort Flexhaler 180 Mcg/act  Inha (Budesonide (Inhalation)) .... Two  Puffs Once Daily 4)  Proair Hfa 108 (90 Base) Mcg/act  Aers (Albuterol Sulfate) .Marland Kitchen.. 1-2 Puffs Every 4-6 Hours As Needed 5)  Mucinex 600 Mg Xr12h-Tab (Guaifenesin) .... Two Times A Day 6)  Tums E-X 750 750 Mg Chew (Calcium Carbonate Antacid) .... Two Times A Day 7)  Glucosamine-Chondroitin 500-400 Mg Caps (Glucosamine-Chondroitin) .... Two Times A Day 8)  Multivitamins  Tabs (Multiple Vitamin) .... Three Times Weekly 9)  Losartan Potassium 100 Mg Tabs (Losartan Potassium) .... One By Mouth Once Daily 10)  Meloxicam 15 Mg Tabs (Meloxicam) .... One By Mouth Once Daily As Needed  Allergies (verified): No Known Drug Allergies  Past History:  Past medical, surgical, family and social histories (including risk factors) reviewed, and no changes noted (except as noted below).  Past Medical History: Reviewed history from 08/15/2010 and no changes required. Allergic rhinitis COPD    -FeV1 64%  2007  GERD  Hx lung abscess/bronchiectasis with RML and RLL resection - 1996 (Dr. Edwyna Shell) Hypertension Osteoarthritis (hands, knees)  Past Surgical History: Reviewed history from 06/20/2010 and no changes required. RML and RUL removed.   due to bleeding and bronchiectasis  and lung abscess  Neck Surgery 05/2008 lower back surgery 09/2008 Lower back surgery 06/2009  hernia surgery 10/2009  Family History: Reviewed history from 08/15/2010 and no changes required. Prostate Cancer father died from this  (age 75) Family History of Arthritis Family History of CAD Male 1st degree relative <60 Family History Hypertension Family History of Stroke M 1st degree relative <50 mother died of CHF - age 25     Social History: Reviewed history from 08/15/2010 and no changes required. Retired Librarian, academic Patient states former smoker. 1 1/2 ppd x 38 yrs  Quit in Jan 2002 Married - 2 weeks (4th marriage) divorced,  remarried 84-2004 (lost wife to lung ca),  remarried (divorced),  1 son  - 77 [name]) Alcohol use-yes (2-3 beers per week)    Review of Systems       The patient complains of shortness of breath with activity, productive cough, non-productive cough, nasal congestion/difficulty breathing through nose, and hand/feet swelling.  The patient denies shortness of breath at rest, coughing up blood, chest pain, irregular heartbeats, acid heartburn, indigestion, loss of appetite, weight change, abdominal pain, difficulty swallowing, sore throat, tooth/dental problems, headaches, sneezing, itching, ear ache, anxiety, depression, joint stiffness or pain, rash, change in color  of mucus, and fever.    Vital Signs:  Patient profile:   66 year old male Height:      68 inches Weight:      153 pounds BMI:     23.35 O2 Sat:      97 % on Room air Temp:     98.1 degrees F oral Pulse rate:   79 / minute BP sitting:   132 / 82  (left arm) Cuff size:   regular  Vitals Entered By: Gweneth Dimitri RN (October 18, 2010 9:44 AM)  O2 Flow:  Room air CC: 5 month follow up.  Pt states he is having increased difficulty breathing at times over the past 1-53months.  Seems to be worse when outside.  Some wheezing, slight cough with clear mucus.  Denies chest tightness. Comments Medications reviewed with patient Daytime contact number verified with patient. Gweneth Dimitri RN  October 18, 2010 9:43 AM    Physical Exam  Additional Exam:  Gen: WD WN   WM       in NAD    NCAT Heent:  no jvd, no TMG, no cervical LNademopathy, orophyx clear,  nares clear Cor: RRR nl s1/s2  no s3/s4  no m r h g Abd: soft NT BSA   no masses  No HSM  no rebound or guarding Ext perfused with no c v e v.d Neuro: intact, moves all 4s, CN II-XII intact, DTRs intact Chest: distant BS  , clear  Skin: clear  Genital/Rectal :deferred    Impression & Recommendations:  Problem # 1:  COPD (ICD-496) Assessment Unchanged copd moderate now around more diesel fumes with flare: plan dose titrate pulmicort 2puff once or twice daily  Medications Added to Medication List This Visit: 1)  Pulmicort Flexhaler 180 Mcg/act Inha (Budesonide (inhalation)) .... Two  puffs one to two times per day depending upon fume exposure  Complete Medication List: 1)  Nasacort Aq 55 Mcg/act Aers (Triamcinolone acetonide(nasal)) .... Two puff once daily ea nostirl 2)  Amlodipine Besylate 5 Mg Tabs (Amlodipine besylate) .... Take 1 tablet by mouth once a day 3)  Pulmicort Flexhaler 180 Mcg/act Inha (Budesonide (inhalation)) .... Two  puffs one to two times per day depending upon fume exposure 4)  Proair Hfa 108 (90 Base) Mcg/act Aers (Albuterol sulfate) .Marland Kitchen.. 1-2 puffs every 4-6 hours as needed 5)  Mucinex 600 Mg Xr12h-tab (Guaifenesin) .... Two times a day 6)  Tums E-x 750 750 Mg Chew (Calcium carbonate antacid) .... Two times a day 7)  Glucosamine-chondroitin 500-400 Mg Caps (Glucosamine-chondroitin) .... Two times a day 8)   Multivitamins Tabs (Multiple vitamin) .... Three times weekly 9)  Losartan Potassium 100 Mg Tabs (Losartan potassium) .... One by mouth once daily 10)  Meloxicam 15 Mg Tabs (Meloxicam) .... One by mouth once daily as needed  Other Orders: Est. Patient Level III (47654)  Patient Instructions: 1)  You may dose regulate the pulmicort to 2 puff one to two times a day depending upon the amount of diesel fume exposure and time spent in the yard. 2)  No other medication changes 3)  Return 4 months High POint Prescriptions: PULMICORT FLEXHALER 180 MCG/ACT  INHA (BUDESONIDE (INHALATION)) Two  puffs one to two times per day depending upon fume exposure  #1 x 6   Entered and Authorized by:   Storm Frisk MD   Signed by:   Storm Frisk MD on 10/18/2010   Method used:   Electronically to  [facility]  [address] (retail)       [address]       [place], [place]  [address]       Ph: [phone]       Fax: [phone]   RxID:   [id number]

## 2010-11-02 ENCOUNTER — Encounter: Payer: Self-pay | Admitting: *Deleted

## 2010-11-05 ENCOUNTER — Encounter (INDEPENDENT_AMBULATORY_CARE_PROVIDER_SITE_OTHER): Payer: Self-pay | Admitting: *Deleted

## 2010-11-06 ENCOUNTER — Encounter (INDEPENDENT_AMBULATORY_CARE_PROVIDER_SITE_OTHER): Payer: Self-pay | Admitting: *Deleted

## 2010-11-12 ENCOUNTER — Telehealth: Payer: Self-pay | Admitting: Internal Medicine

## 2010-11-13 NOTE — Miscellaneous (Signed)
Summary: dir col scr-age...sch w pt no bt non diab no med probs bcbs-i...  Clinical Lists Changes  Medications: Added new medication of MOVIPREP 100 GM  SOLR (PEG-KCL-NACL-NASULF-NA ASC-C) As per prep instructions. - Signed Rx of MOVIPREP 100 GM  SOLR (PEG-KCL-NACL-NASULF-NA ASC-C) As per prep instructions.;  #1 x 0;  Signed;  Entered by: Durwin Glaze RN;  Authorized by: Meryl Dare MD [facility];  Method used: Electronically to [facility] [address] [place], [place]  [address], Ph: [phone], Fax: [phone] Observations: Added new observation of NKA: T (11/06/2010 8:47)    Prescriptions: MOVIPREP 100 GM  SOLR (PEG-KCL-NACL-NASULF-NA ASC-C) As per prep instructions.  #1 x 0   Entered by:   Durwin Glaze RN   Authorized by:   Meryl Dare MD [facility]   Signed by:   Durwin Glaze RN on 11/06/2010   Method used:   Electronically to        [facility]  [address] (retail)       [address]       [place], [place]  [address]       Ph: [phone]       Fax: [phone]   RxID:   [id number]

## 2010-11-13 NOTE — Letter (Signed)
Summary: Moviprep Instructions  [facility]  [address]   [place], [place] [address]   Phone: [phone]  Fax: [phone]       SUMMIT ARROYAVE    [birth year]    MRN: [id number]        Procedure Day Dorna Bloom: Tuesday, 11-20-10     Arrival Time: 1:30 p.m.     Procedure Time: 2:30 p.m.     Location of Procedure:                    x  [facility] (4th Floor)                        PREPARATION FOR COLONOSCOPY WITH MOVIPREP   Starting 5 days prior to your procedure 11-15-10 do not eat nuts, seeds, popcorn, corn, beans, peas,  salads, or any raw vegetables.  Do not take any fiber supplements (e.g. Metamucil, Citrucel, and Benefiber).  THE DAY BEFORE YOUR PROCEDURE         DATE: 11-19-10  DAY: Monday  1.  Drink clear liquids the entire day-NO SOLID FOOD  2.  Do not drink anything colored red or purple.  Avoid juices with pulp.  No orange juice.  3.  Drink at least 64 oz. (8 glasses) of fluid/clear liquids during the day to prevent dehydration and help the prep work efficiently.  CLEAR LIQUIDS INCLUDE: Water Jello Ice Popsicles Tea (sugar ok, no milk/cream) Powdered fruit flavored drinks Coffee (sugar ok, no milk/cream) Gatorade Juice: apple, white grape, white cranberry  Lemonade Clear bullion, consomm, broth Carbonated beverages (any kind) Strained chicken noodle soup Hard Candy                             4.  In the morning, mix first dose of MoviPrep solution:    Empty 1 Pouch A and 1 Pouch B into the disposable container    Add lukewarm drinking water to the top line of the container. Mix to dissolve    Refrigerate (mixed solution should be used within 24 hrs)  5.  Begin drinking the prep at 5:00 p.m. The MoviPrep container is divided by 4 marks.   Every 15 minutes drink the solution down to the next mark (approximately 8 oz) until the full liter is complete.   6.  Follow completed prep with 16 oz of clear liquid of your choice  (Nothing red or purple).  Continue to drink clear liquids until bedtime.  7.  Before going to bed, mix second dose of MoviPrep solution:    Empty 1 Pouch A and 1 Pouch B into the disposable container    Add lukewarm drinking water to the top line of the container. Mix to dissolve    Refrigerate  THE DAY OF YOUR PROCEDURE      DATE: 11-20-10  DAY: Tuesday  Beginning at 9:30 a.m. (5 hours before procedure):         1. Every 15 minutes, drink the solution down to the next mark (approx 8 oz) until the full liter is complete.  2. Follow completed prep with 16 oz. of clear liquid of your choice.    3. You may drink clear liquids until 12:30 p.m.(2 HOURS BEFORE PROCEDURE).   MEDICATION INSTRUCTIONS  Unless otherwise instructed, you should take regular prescription medications with a small sip of water   as early as possible the morning of your  procedure.         OTHER INSTRUCTIONS  You will need a responsible adult at least 64 years of age to accompany you and drive you home.   This person must remain in the waiting room during your procedure.  Wear loose fitting clothing that is easily removed.  Leave jewelry and other valuables at home.  However, you may wish to bring a book to read or  an iPod/MP3 player to listen to music as you wait for your procedure to start.  Remove all body piercing jewelry and leave at home.  Total time from sign-in until discharge is approximately 2-3 hours.  You should go home directly after your procedure and rest.  You can resume normal activities the  day after your procedure.  The day of your procedure you should not:   Drive   Make legal decisions   Operate machinery   Drink alcohol   Return to work  You will receive specific instructions about eating, activities and medications before you leave.    The above instructions have been reviewed and explained to me by   Durwin Glaze RN  November 06, 2010 9:04 AM    I fully understand  and can verbalize these instructions _____________________________ Date _________

## 2010-11-20 ENCOUNTER — Other Ambulatory Visit: Payer: BC Managed Care – PPO | Admitting: Gastroenterology

## 2010-11-20 ENCOUNTER — Encounter: Payer: Self-pay | Admitting: Gastroenterology

## 2010-11-20 ENCOUNTER — Ambulatory Visit (AMBULATORY_SURGERY_CENTER): Payer: BC Managed Care – PPO | Admitting: Gastroenterology

## 2010-11-20 VITALS — BP 139/76 | HR 77 | Temp 97.9°F | Resp 13

## 2010-11-20 DIAGNOSIS — D126 Benign neoplasm of colon, unspecified: Secondary | ICD-10-CM

## 2010-11-20 DIAGNOSIS — K5730 Diverticulosis of large intestine without perforation or abscess without bleeding: Secondary | ICD-10-CM

## 2010-11-20 DIAGNOSIS — Z1211 Encounter for screening for malignant neoplasm of colon: Secondary | ICD-10-CM

## 2010-11-20 HISTORY — PX: COLONOSCOPY: SHX174

## 2010-11-20 LAB — HM COLONOSCOPY: HM Colonoscopy: 5

## 2010-11-20 NOTE — Progress Notes (Signed)
Summary: refill-amlodipine and nasacort  Phone Note Refill Request Message from:  Fax from Pharmacy on November 12, 2010 10:05 AM  Refills Requested: Medication #1:  amlodipine 10mg  tab take one tablet by mouth every day for blood pressure   Brand Name Necessary? No   Supply Requested: 3 months   Last Refilled: 05/28/2010  Medication #2:  NASACORT AQ 55 MCG/ACT  AERS two puff once daily ea nostirl   Dosage confirmed as above?Dosage Confirmed   Brand Name Necessary? No   Supply Requested: 1 month   Last Refilled: 06/26/2010 [facility] [address] [place] [address] fax [phone]   Method Requested: Electronic Next Appointment Scheduled: 6.11.12 yoo Initial call taken by: Elba Barman,  November 12, 2010 10:07 AM  Follow-up for Phone Call        ok to refill x 3 Follow-up by: D. Thomos Lemons DO,  November 12, 2010 4:34 PM  Additional Follow-up for Phone Call Additional follow up Details #1::        rx sent electronically to pharmacy Additional Follow-up by: Glendell Docker CMA,  November 12, 2010 4:41 PM    Prescriptions: AMLODIPINE BESYLATE 5 MG TABS (AMLODIPINE BESYLATE) Take 1 tablet by mouth once a day  #90 x 0   Entered by:   Glendell Docker CMA   Authorized by:   D. Thomos Lemons DO   Signed by:   Glendell Docker CMA on 11/12/2010   Method used:   Electronically to        [facility]  [address] (retail)       [address]       [place], [place]  [address]       Ph: [phone]       Fax: [phone]   RxID:   [id number] NASACORT AQ 55 MCG/ACT  AERS (TRIAMCINOLONE ACETONIDE(NASAL)) two puff once daily ea nostirl  #3 x 3   Entered by:   Glendell Docker CMA   Authorized by:   D. Thomos Lemons DO   Signed by:   Glendell Docker CMA on 11/12/2010   Method used:   Electronically to        [facility]  [address] (retail)       [address]       [place], [place]  [address]       Ph: [phone]       Fax: [phone]   RxID:   [id number]

## 2010-11-20 NOTE — Patient Instructions (Signed)
Read handouts regarding diverticulosis and polyps, and a high fiber diet is suggested to help prevent diverticulitis.  You will receive polyp results in the mail in 2 weeks, and the letter will tell you when to come back for another colonoscopy ie: 5-10 yrs.

## 2010-11-21 ENCOUNTER — Telehealth: Payer: Self-pay | Admitting: *Deleted

## 2010-11-21 NOTE — Telephone Encounter (Signed)
Message left at home number.  Pt's name was on the message. maw

## 2010-11-22 LAB — COMPREHENSIVE METABOLIC PANEL
ALT: 17 U/L (ref 0–53)
AST: 21 U/L (ref 0–37)
Albumin: 3.8 g/dL (ref 3.5–5.2)
Alkaline Phosphatase: 74 U/L (ref 39–117)
BUN: 15 mg/dL (ref 6–23)
CO2: 25 mEq/L (ref 19–32)
Calcium: 9 mg/dL (ref 8.4–10.5)
Chloride: 103 mEq/L (ref 96–112)
Creatinine, Ser: 0.91 mg/dL (ref 0.4–1.5)
GFR calc Af Amer: >60 mL/min (ref >60)
GFR calc non Af Amer: >60 mL/min (ref >60)
Glucose, Bld: 105 mg/dL — ABNORMAL HIGH (ref 70–99)
Potassium: 4.1 mEq/L (ref 3.5–5.1)
Sodium: 135 mEq/L (ref 135–145)
Total Bilirubin: 1 mg/dL (ref 0.3–1.2)
Total Protein: 7.1 g/dL (ref 6.0–8.3)

## 2010-11-22 LAB — CBC
HCT: 39.8 % (ref 39.0–52.0)
Hemoglobin: 13.4 g/dL (ref 13.0–17.0)
MCHC: 33.7 g/dL (ref 30.0–36.0)
MCV: 88.8 fL (ref 78.0–100.0)
Platelets: 291 10*3/uL (ref 150–400)
RBC: 4.48 MIL/uL (ref 4.22–5.81)
RDW: 14.2 % (ref 11.5–15.5)
WBC: 7.6 10*3/uL (ref 4.0–10.5)

## 2010-11-22 LAB — DIFFERENTIAL
Basophils Absolute: 0 10*3/uL (ref 0.0–0.1)
Basophils Relative: 1 % (ref 0–1)
Eosinophils Absolute: 0.1 10*3/uL (ref 0.0–0.7)
Eosinophils Relative: 1 % (ref 0–5)
Lymphocytes Relative: 20 % (ref 12–46)
Lymphs Abs: 1.5 10*3/uL (ref 0.7–4.0)
Monocytes Absolute: 0.4 10*3/uL (ref 0.1–1.0)
Monocytes Relative: 5 % (ref 3–12)
Neutro Abs: 5.6 10*3/uL (ref 1.7–7.7)
Neutrophils Relative %: 74 % (ref 43–77)

## 2010-11-26 ENCOUNTER — Encounter: Payer: Self-pay | Admitting: Gastroenterology

## 2010-11-29 NOTE — Procedures (Signed)
Summary: Colonoscopy  Patient: [name] Note: All result statuses are Final unless otherwise noted.  Tests: (1) Colonoscopy (COL)   COL Colonoscopy           DONE     [facility]     [address]     [place], [place]  [address]          COLONOSCOPY PROCEDURE REPORT          PATIENT:  [name], [name]  MR#:  [id number]     BIRTHDATE:  April 28, 1946, 65 yrs. old  GENDER:  male     ENDOSCOPIST:  Judie Petit T. Russella Dar, MD, [facility]     Referred by:  Thomos Lemons, DO     PROCEDURE DATE:  11/20/2010     PROCEDURE:  Colonoscopy with snare polypectomy     ASA CLASS:  Class II     INDICATIONS:  1) Routine Risk Screening     MEDICATIONS:   Fentanyl 75 mcg IV, Versed 6 mg IV     DESCRIPTION OF PROCEDURE:   After the risks benefits and     alternatives of the procedure were thoroughly explained, informed     consent was obtained.  Digital rectal exam was performed and     revealed no abnormalities.   The LB [id number] endoscope     was introduced through the anus and advanced to the cecum, which     was identified by both the appendix and ileocecal valve, without     limitations.  The quality of the prep was good, using MoviPrep.     The instrument was then slowly withdrawn as the colon was fully     examined.     <<PROCEDUREIMAGES>>     FINDINGS:  A sessile polyp was found in the ascending colon. It     was 5 mm in size. The polyp was removed using cold biopsy forceps.     Mild diverticulosis was found in the sigmoid colon.  A normal     appearing cecum, ileocecal valve, and appendiceal orifice were     identified. The hepatic flexure, transverse, splenic flexure,     descending colon, and rectum appeared unremarkable. Retroflexed     views in the rectum revealed no abnormalities.    The time to     cecum =  2.25  minutes. The scope was then withdrawn (time =     11.67  min) from the patient and the procedure completed.          COMPLICATIONS:  None          ENDOSCOPIC  IMPRESSION:     1) 5 mm sessile polyp in the ascending colon     2) Mild diverticulosis in the sigmoid colon          RECOMMENDATIONS:     1) Await pathology results     2) High fiber diet with liberal fluid intake.     3) If the polyp is adenomatous (pre-cancerous), colonoscopy in 5     years. Otherwise follow colorectal cancer screening guidelines for     "routine risk" patients with colonoscopy in 10 years.          Venita Lick. Russella Dar, MD, [facility]          n.     eSIGNED:   Venita Lick. Adelfo Diebel at 11/20/2010 03:40 PM          Gwenyth Allegra, [id number]  Note: An exclamation mark (!) indicates  a result that was not dispersed into the flowsheet. Document Creation Date: 11/20/2010 3:40 PM _______________________________________________________________________  (1) Order result status: Final Collection or observation date-time: 11/20/2010 15:33 Requested date-time:  Receipt date-time:  Reported date-time:  Referring Physician:   Ordering Physician: Claudette Head [phone]) Specimen Source:  Source: Launa Grill Order Number: [id number] Lab site:

## 2010-12-06 LAB — CBC
HCT: 28.1 % — ABNORMAL LOW (ref 39.0–52.0)
HCT: 32 % — ABNORMAL LOW (ref 39.0–52.0)
HCT: 41.2 % (ref 39.0–52.0)
Hemoglobin: 11 g/dL — ABNORMAL LOW (ref 13.0–17.0)
Hemoglobin: 14.3 g/dL (ref 13.0–17.0)
Hemoglobin: 9.8 g/dL — ABNORMAL LOW (ref 13.0–17.0)
MCHC: 34.5 g/dL (ref 30.0–36.0)
MCHC: 34.7 g/dL (ref 30.0–36.0)
MCHC: 34.9 g/dL (ref 30.0–36.0)
MCV: 91.4 fL (ref 78.0–100.0)
MCV: 92.1 fL (ref 78.0–100.0)
MCV: 92.3 fL (ref 78.0–100.0)
Platelets: 267 10*3/uL (ref 150–400)
Platelets: 305 10*3/uL (ref 150–400)
Platelets: 383 10*3/uL (ref 150–400)
RBC: 3.05 MIL/uL — ABNORMAL LOW (ref 4.22–5.81)
RBC: 3.47 MIL/uL — ABNORMAL LOW (ref 4.22–5.81)
RBC: 4.51 MIL/uL (ref 4.22–5.81)
RDW: 13.5 % (ref 11.5–15.5)
RDW: 13.8 % (ref 11.5–15.5)
RDW: 14.1 % (ref 11.5–15.5)
WBC: 10.9 10*3/uL — ABNORMAL HIGH (ref 4.0–10.5)
WBC: 8.3 10*3/uL (ref 4.0–10.5)
WBC: 9.4 10*3/uL (ref 4.0–10.5)

## 2010-12-06 LAB — BASIC METABOLIC PANEL
BUN: 20 mg/dL (ref 6–23)
BUN: 27 mg/dL — ABNORMAL HIGH (ref 6–23)
CO2: 27 mEq/L (ref 19–32)
CO2: 28 mEq/L (ref 19–32)
Calcium: 8.1 mg/dL — ABNORMAL LOW (ref 8.4–10.5)
Calcium: 9.6 mg/dL (ref 8.4–10.5)
Chloride: 100 mEq/L (ref 96–112)
Chloride: 103 mEq/L (ref 96–112)
Creatinine, Ser: 0.86 mg/dL (ref 0.4–1.5)
Creatinine, Ser: 0.87 mg/dL (ref 0.4–1.5)
GFR calc Af Amer: >60 mL/min (ref >60)
GFR calc Af Amer: >60 mL/min (ref >60)
GFR calc non Af Amer: >60 mL/min (ref >60)
GFR calc non Af Amer: >60 mL/min (ref >60)
Glucose, Bld: 105 mg/dL — ABNORMAL HIGH (ref 70–99)
Glucose, Bld: 138 mg/dL — ABNORMAL HIGH (ref 70–99)
Potassium: 4.3 mEq/L (ref 3.5–5.1)
Potassium: 4.7 mEq/L (ref 3.5–5.1)
Sodium: 132 mEq/L — ABNORMAL LOW (ref 135–145)
Sodium: 136 mEq/L (ref 135–145)

## 2010-12-06 LAB — TYPE AND SCREEN
ABO/RH(D): O POS
Antibody Screen: NEGATIVE

## 2010-12-06 LAB — URINALYSIS, ROUTINE W REFLEX MICROSCOPIC
Bilirubin Urine: NEGATIVE
Glucose, UA: NEGATIVE mg/dL
Hgb urine dipstick: NEGATIVE
Ketones, ur: NEGATIVE mg/dL
Nitrite: NEGATIVE
Protein, ur: NEGATIVE mg/dL
Specific Gravity, Urine: 1.028 (ref 1.005–1.030)
Urobilinogen, UA: 0.2 mg/dL (ref 0.0–1.0)
pH: 6 (ref 5.0–8.0)

## 2010-12-06 LAB — DIFFERENTIAL
Basophils Absolute: 0 10*3/uL (ref 0.0–0.1)
Basophils Relative: 0 % (ref 0–1)
Eosinophils Absolute: 0.1 10*3/uL (ref 0.0–0.7)
Eosinophils Relative: 2 % (ref 0–5)
Lymphocytes Relative: 14 % (ref 12–46)
Lymphs Abs: 1.3 10*3/uL (ref 0.7–4.0)
Monocytes Absolute: 0.7 10*3/uL (ref 0.1–1.0)
Monocytes Relative: 7 % (ref 3–12)
Neutro Abs: 7.3 10*3/uL (ref 1.7–7.7)
Neutrophils Relative %: 77 % (ref 43–77)

## 2010-12-06 LAB — PROTIME-INR
INR: 1.07 (ref 0.00–1.49)
Prothrombin Time: 13.8 seconds (ref 11.6–15.2)

## 2010-12-06 LAB — APTT: aPTT: 27 seconds (ref 24–37)

## 2010-12-14 ENCOUNTER — Telehealth: Payer: Self-pay | Admitting: Internal Medicine

## 2010-12-14 DIAGNOSIS — M1990 Unspecified osteoarthritis, unspecified site: Secondary | ICD-10-CM

## 2010-12-14 MED ORDER — DICLOFENAC SODIUM 1 % TD GEL: 1.0000 "application " | Freq: Four times a day (QID) | TRANSDERMAL | Status: DC

## 2010-12-14 NOTE — Telephone Encounter (Signed)
Refill-meloxicam 15mg  tab. Take one tablet by mouth every day as needed. Qty 30. Last fill 1.18.12

## 2010-12-14 NOTE — Telephone Encounter (Signed)
Call placed to patient at [phone], no answer. A detailed voice message was left informing patient per Sandford Craze instructions. Rx for Voltaren gel sent to pharmacy

## 2010-12-14 NOTE — Telephone Encounter (Signed)
Patient is requesting refill on Voltaren Gel and Meloxicam. Is it okay to refill medications for patient. His next appointment with Dr Artist Pais is February 11, 2011

## 2010-12-14 NOTE — Telephone Encounter (Signed)
Please let patient know that Dr. Olegario Messier most recent recommendation was for Aleve PRN.  No refill on Meloxicam.  OK to refill the voltaren gel.

## 2010-12-17 ENCOUNTER — Other Ambulatory Visit: Payer: Self-pay | Admitting: *Deleted

## 2010-12-17 DIAGNOSIS — M1990 Unspecified osteoarthritis, unspecified site: Secondary | ICD-10-CM

## 2010-12-17 LAB — CBC
HCT: 26.9 % — ABNORMAL LOW (ref 39.0–52.0)
HCT: 27.1 % — ABNORMAL LOW (ref 39.0–52.0)
HCT: 30 % — ABNORMAL LOW (ref 39.0–52.0)
HCT: 42.6 % (ref 39.0–52.0)
Hemoglobin: 10.1 g/dL — ABNORMAL LOW (ref 13.0–17.0)
Hemoglobin: 14 g/dL (ref 13.0–17.0)
Hemoglobin: 9.1 g/dL — ABNORMAL LOW (ref 13.0–17.0)
Hemoglobin: 9.3 g/dL — ABNORMAL LOW (ref 13.0–17.0)
MCHC: 32.9 g/dL (ref 30.0–36.0)
MCHC: 33.8 g/dL (ref 30.0–36.0)
MCHC: 33.9 g/dL (ref 30.0–36.0)
MCHC: 34.2 g/dL (ref 30.0–36.0)
MCV: 90.7 fL (ref 78.0–100.0)
MCV: 91.1 fL (ref 78.0–100.0)
MCV: 91.1 fL (ref 78.0–100.0)
MCV: 91.3 fL (ref 78.0–100.0)
Platelets: 194 10*3/uL (ref 150–400)
Platelets: 208 10*3/uL (ref 150–400)
Platelets: 221 10*3/uL (ref 150–400)
Platelets: 331 10*3/uL (ref 150–400)
RBC: 2.96 MIL/uL — ABNORMAL LOW (ref 4.22–5.81)
RBC: 2.97 MIL/uL — ABNORMAL LOW (ref 4.22–5.81)
RBC: 3.29 MIL/uL — ABNORMAL LOW (ref 4.22–5.81)
RBC: 4.67 MIL/uL (ref 4.22–5.81)
RDW: 13.7 % (ref 11.5–15.5)
RDW: 13.8 % (ref 11.5–15.5)
RDW: 14.3 % (ref 11.5–15.5)
RDW: 14.4 % (ref 11.5–15.5)
WBC: 5.9 10*3/uL (ref 4.0–10.5)
WBC: 6.1 10*3/uL (ref 4.0–10.5)
WBC: 7.3 10*3/uL (ref 4.0–10.5)
WBC: 8.3 10*3/uL (ref 4.0–10.5)

## 2010-12-17 LAB — URINALYSIS, ROUTINE W REFLEX MICROSCOPIC
Bilirubin Urine: NEGATIVE
Glucose, UA: NEGATIVE mg/dL
Hgb urine dipstick: NEGATIVE
Ketones, ur: NEGATIVE mg/dL
Nitrite: NEGATIVE
Protein, ur: NEGATIVE mg/dL
Specific Gravity, Urine: 1.016 (ref 1.005–1.030)
Urobilinogen, UA: 0.2 mg/dL (ref 0.0–1.0)
pH: 8 (ref 5.0–8.0)

## 2010-12-17 LAB — BASIC METABOLIC PANEL
BUN: 10 mg/dL (ref 6–23)
BUN: 15 mg/dL (ref 6–23)
BUN: 18 mg/dL (ref 6–23)
BUN: 23 mg/dL (ref 6–23)
CO2: 25 mEq/L (ref 19–32)
CO2: 28 mEq/L (ref 19–32)
CO2: 28 mEq/L (ref 19–32)
CO2: 29 mEq/L (ref 19–32)
Calcium: 10.5 mg/dL (ref 8.4–10.5)
Calcium: 7.6 mg/dL — ABNORMAL LOW (ref 8.4–10.5)
Calcium: 7.7 mg/dL — ABNORMAL LOW (ref 8.4–10.5)
Calcium: 7.9 mg/dL — ABNORMAL LOW (ref 8.4–10.5)
Chloride: 101 mEq/L (ref 96–112)
Chloride: 101 mEq/L (ref 96–112)
Chloride: 97 mEq/L (ref 96–112)
Chloride: 99 mEq/L (ref 96–112)
Creatinine, Ser: 0.78 mg/dL (ref 0.4–1.5)
Creatinine, Ser: 0.83 mg/dL (ref 0.4–1.5)
Creatinine, Ser: 0.87 mg/dL (ref 0.4–1.5)
Creatinine, Ser: 0.97 mg/dL (ref 0.4–1.5)
GFR calc Af Amer: >60 mL/min (ref >60)
GFR calc Af Amer: >60 mL/min (ref >60)
GFR calc Af Amer: >60 mL/min (ref >60)
GFR calc Af Amer: >60 mL/min (ref >60)
GFR calc non Af Amer: >60 mL/min (ref >60)
GFR calc non Af Amer: >60 mL/min (ref >60)
GFR calc non Af Amer: >60 mL/min (ref >60)
GFR calc non Af Amer: >60 mL/min (ref >60)
Glucose, Bld: 124 mg/dL — ABNORMAL HIGH (ref 70–99)
Glucose, Bld: 150 mg/dL — ABNORMAL HIGH (ref 70–99)
Glucose, Bld: 197 mg/dL — ABNORMAL HIGH (ref 70–99)
Glucose, Bld: 85 mg/dL (ref 70–99)
Potassium: 3.9 mEq/L (ref 3.5–5.1)
Potassium: 4 mEq/L (ref 3.5–5.1)
Potassium: 4.2 mEq/L (ref 3.5–5.1)
Potassium: 5 mEq/L (ref 3.5–5.1)
Sodium: 130 mEq/L — ABNORMAL LOW (ref 135–145)
Sodium: 134 mEq/L — ABNORMAL LOW (ref 135–145)
Sodium: 134 mEq/L — ABNORMAL LOW (ref 135–145)
Sodium: 136 mEq/L (ref 135–145)

## 2010-12-17 LAB — PROTIME-INR
INR: 1 (ref 0.00–1.49)
Prothrombin Time: 13.6 seconds (ref 11.6–15.2)

## 2010-12-17 LAB — CROSSMATCH
ABO/RH(D): O POS
Antibody Screen: NEGATIVE

## 2010-12-17 LAB — ABO/RH: ABO/RH(D): O POS

## 2010-12-17 LAB — APTT: aPTT: 30 seconds (ref 24–37)

## 2010-12-17 MED ORDER — MELOXICAM 15 MG PO TABS: 15.0000 mg | ORAL_TABLET | Freq: Every day | ORAL | Status: DC | PRN

## 2010-12-17 NOTE — Telephone Encounter (Signed)
Patient called requesting a refill for Meloxicam, he was informed he was advised to use Aleve. Patient states the Aleve has not worked for him and the Meloxicam is the only thing that is currently helping his arthritis pain,and is asking that Dr Artist Pais refill the medication.

## 2010-12-17 NOTE — Telephone Encounter (Signed)
Call placed to patient at [phone], he was informed per Dr Artist Pais instructions. Patient has scheduled follow up for May 17th @ 10:30a. He was advised to have blood work 2-3 days prior to his scheduled appointment for May. Patient has verbalized understanding and agrees

## 2010-12-17 NOTE — Telephone Encounter (Signed)
We need to monitor his kidney function while he takes meloxicam. See order. I suggest OV within 1 month  See refill

## 2011-01-15 ENCOUNTER — Encounter: Payer: Self-pay | Admitting: Internal Medicine

## 2011-01-15 LAB — BASIC METABOLIC PANEL WITH GFR
BUN: 21 mg/dL (ref 6–23)
CO2: 22 mEq/L (ref 19–32)
Calcium: 9.4 mg/dL (ref 8.4–10.5)
Chloride: 105 mEq/L (ref 96–112)
Creat: 0.81 mg/dL (ref 0.40–1.50)
GFR, Est African American: >60 mL/min (ref >60)
GFR, Est Non African American: >60 mL/min (ref >60)
Glucose, Bld: 84 mg/dL (ref 70–99)
Potassium: 4.3 mEq/L (ref 3.5–5.3)
Sodium: 139 mEq/L (ref 135–145)

## 2011-01-15 NOTE — Op Note (Signed)
[name], [name]               ACCOUNT NO.:  [id number]   MEDICAL RECORD NO.:  [id number]          PATIENT TYPE:  INP   LOCATION:  3010                         FACILITY:  [facility]   PHYSICIAN:  Clydene Fake, M.D.  DATE OF BIRTH:  [birth year]   DATE OF PROCEDURE:  05/23/2008  DATE OF DISCHARGE:                               OPERATIVE REPORT   PREOPERATIVE DIAGNOSIS:  Herniated nucleus pulposus and spondylosis and  stenosis with myelopathy and cord compression at C3-4, C4-5, C5-6, and  C6-7.   POSTOPERATIVE DIAGNOSIS:  Herniated nucleus pulposus and spondylosis and  stenosis with myelopathy and cord compression at C3-4, C4-5, C5-6, and  C6-7.   PROCEDURE:  Intracervical decompression diskectomy and fusion at C3-4,  C4-5, C5-6, and C6-7 with LifeNet allograft bone, Premier anterior  cervical plate.   SURGEON:  Clydene Fake, MD   ASSISTANT:  Danae Orleans. Venetia Maxon, MD   ANESTHESIA:  General endotracheal tube anesthesia.   ESTIMATED BLOOD LOSS:  Minimal.   BLOOD GIVEN:  None.   DRAINS:  None.   COMPLICATIONS:  None.   REASON FOR PROCEDURE:  The patient is a 65 year old gentleman who had  spasticity, trouble walking, clumsiness that have been progressive.  MRI  of cervical spine shows cord compression, severe spinal changes, and  some cord change at C3-4 and C5-6 and stenosis from C3 through 7.  The  patient brought in for decompression and fusion.   PROCEDURE IN DETAIL:  The patient was brought to the operating room and  general anesthesia was induced.  The patient was placed in 10-pound  halter traction, prepped and draped in a sterile fashion.  A standard  incision was injected with 20 mL of 1% lidocaine with epinephrine.  Incision was then made to the anterior border of the sternocleidomastoid  muscle on the left side.  Neck incision was taken down to the platysma  and hemostasis obtained with Bovie cauterization.  The platysma was  incised with Bovie and blunt  dissection taken through the anterior  cervical fascia to the anterior cervical spine.  Next we duly exposed  disk spaces, placed needles in two of the disk spaces, took an x-ray and  this confirmed our positioning and that the needles were at the C3-4.  We skipped the C4-5 and the needle was at the C5-6 disk, this confirmed  our positioning intraoperatively.  We incised the disk spaces and  partial diskectomy with pituitary rongeurs as we removed the needles.  The longus colli muscles were reflected laterally from C3 through 7  bilaterally.  Anterior osteophytes were removed.  Self-retaining  retractor was placed and distraction pins were placed in the C3 and C5  with good exposure of C3-4 and C4-5 disk.  We used a high-speed drill to  remove the anterior osteophytes and drilled down the disk space at both  levels.  Drill down the posterior cortex and 1 and 2 mm Kerrison punches  were used to remove the posterior disk osteophyte and ligament  decompressing the central canal.  We were able to distract the  interspace a bit more  after this with the distractor in order to have  foraminotomies bilaterally.  I made sure that we had good foraminotomies  bilaterally, but central canal was decompressed both at C3-4 and C4-5  levels.  We measured height of disk space to be 4 mm at each space of  the graft.  Hemostasis with Gelfoam and thrombin, this was irrigated out  with antibiotic solution.  We tapped in two LifeNet allograft bones one  in C3-4 and one in C4-5, countersinking them a few millimeters.  We  checked posterior to the graft with a nerve hook and the area between  the bone graft and the dura at each level.  Distraction pin was removed  from C3 and placed in the C7, removed the retractors down so we can see  C5-6 and C6-7 levels, placed the distractor and distracted the  interspaces as we removed the anterior osteophytes and again high-speed  drill was used from most of the diskectomy  to the severe spondylotic  nature of the disk spaces and then 1 and 2 mm Kerrison punches were used  to remove posterior disk osteophytes and ligaments.  Bilateral  foraminotomies were again performed after getting good central  decompression.  We were able to distract the interspaces a bit more,  again we made sure we used good central and lateral decompression and we  measured height of disk space to be 4 mm.  At each spot we got  hemostasis with Gelfoam and thrombin, this was irrigated out.  We tapped  in 2 more LifeNet allograft bones into the C5-6 and C6-7 interspaces  countersinking them a few millimeters, again checking the height of the  bone graft and the area between bone graft and dura.  The distraction  pins were removed, weight was removed from the traction.  We smoothed  out the remaining osteophytes and placed a Premier anterior cervical  plate over the anterior cervical spine, placed 2 screws in the C7, 2  more in the C3, tighten was done and then placed two screws in the C4,  C5, and C6.  Locking mechanism was then slid up into position and that  was locked up by tightening the screws.  __________.  The lateral x-rays  were obtained showing good position of the plate screws, bone plug at  the 3-4, 4-5, 5-6, and 6-7.  Retractors removed.  We had good hemostasis  __________ solution and the platysma was closed with 3-0 Vicryl  interrupted suture, subcutaneous tissue was closed with the same, the  skin closed with benzoin and Steri-Strips.  A dressing was placed.  The  patient was placed in a Aspen cervical collar, awoken from anesthesia  and transferred to recovery room in stable condition.           ______________________________  Clydene Fake, M.D.     JRH/MEDQ  D:  05/23/2008  T:  05/24/2008  Job:  [id number]

## 2011-01-15 NOTE — Op Note (Signed)
NAMEJONI, [name] NO.:  [id number]   MEDICAL RECORD NO.:  [id number]          PATIENT TYPE:  INP   LOCATION:  3103                         FACILITY:  [facility]   PHYSICIAN:  Clydene Fake, M.D.  DATE OF BIRTH:  03-07-1946   DATE OF PROCEDURE:  09/12/2008  DATE OF DISCHARGE:                               OPERATIVE REPORT   PREOPERATIVE DIAGNOSES:  Stenosis, spondylosis, and unstable  spondylolisthesis at L2-L3, 3-4, and 4-5.   POSTOPERATIVE DIAGNOSIS:  Stenosis, spondylosis, unstable  spondylolisthesis at L2-L3, 3-4, and 4-5.   PROCEDURE:  Decompressive laminectomy, decompression of L2, L3, L4, and  L5 roots (4 levels), posterior lumbar interbody fusion at L2-3, 3-4, 4-5  (3 levels), Saber interbody cages at L2-3, 3-4, and 4-5 (3 levels),  expedient segmented pedicle screw fixation at L2-5, posterolateral  fusion L2-5 (3 levels), autograft same incision, infused BMP.   SURGEON:  Clydene Fake, MD   ASSISTANT:  Stefani Dama, MD   ANESTHESIA:  General endotracheal tube anesthesia.   ESTIMATED BLOOD LOSS:  350 mL.   BLOOD GIVEN:  125 mL of Cell Saver return.   COMPLICATIONS:  None.   DRAINS:  None.   REASON FOR PROCEDURE:  The patient is a 65 year old gentleman with back  and leg pain, trouble walking, found to have unstable spondylolisthesis  at L4-5 with severe stenosis at L3-4 with right-sided synovial cyst  associated with this and severe stenosis, worse on the left side at L2-3  with some left-sided scoliosis and some retrolisthesis and the patient  is brought in for decompression and fusion of these levels.   PROCEDURE IN DETAIL:  The patient was brought into the operating room  and general anesthesia was induced.  The patient was placed onto a  Wilson frame with all pressure points padded.  The patient was prepped  and draped in a sterile fashion.  Site of incision was injected with 20  mL of 1% lidocaine with epinephrine.  An incision was  made in the  midline of the lower lumbar spine.  The incision was taken down the  fascia.  Hemostasis was obtained with Bovie cauterization.  The fascia  was incised and subperiosteal dissection was done over the spinous  processes and facets exposing the transverse processes.  On the left  side, we exposed transverse processes of L2-3, L4-5, and lateral sacrum  and placed markers at each of these spines, going up the sacrum and  placed markers over these processes.  We took an x-ray and confirmed our  positioning of the markers at the transverse processes of L2-3 and L4-5.  We then exposed the contralateral side and performed decompressive  laminectomy, decompressing the thecal sac and nerve roots of L2, L3, L4,  and L5 bilaterally.  Extensive laminectomies, foraminotomies, and medial  facetectomies were performed __________ decompress the nerve roots and  synovial cyst that was seen on the right side of L3-4 causing some  severe stenosis and this was removed also.  We used Kerrison punches,  [employer], and Proofreader.  Bone was saved and cleaned from  its soft tissue, chopped them into small pieces for use later in the  case.  When we were finished, we had good decompression of central canal  and good decompression of nerve roots at L2, L3, L4, and L5 roots  bilaterally.  We then explored disk spaces __________  of L4-5  __________ disk space incised and diskectomy done.  We then distracted  the interspaces up to 10 mm on each side and used various broaches and  scrapers to continue removing disk repair for interbody fusion.  Once we  had the disk space cleaned out, we held distraction alongside, packed  the interspace with autograft bone and then tapped with a Saber  interbody cage.  We removed this distraction of other side and then  placed a cage on that side.  We used 10-mm cages at the L4-5 level and  they were in good place.  The patient had good decompression of  nerve  roots.  Attention was then taken to L3-4, again diskectomy done  bilaterally, distracted up to 9 mm, prepared the interbody space for  interbody fusion using the broaches and scrapers, continued removing the  disk and packed to 9-mm Saber cages with autograft bone and infused BMP  and this was also used at the L4-5 level, packed the interspace with  autograft bone and tapped cages into place.  This whole process was  repeated at L2-3 using 9-mm cages.  When we were finished, we had good  view and restoration of the disk height.  We reduced some spinal pieces  at L4-5 and had good position of the cages and still good decompression  of central canal and all the nerve roots L2-5.  We irrigated with  antibiotic solution and used high-speed drill to decorticate lateral  facets and transverse processes at L2-5.  Hypertrophic facets were at L4-  5 and this was removed with Leksell rongeur.  Using fluoroscopy and an  intraoperative landmarks, we used high-speed drill to decorticate the  pedicle entry point, placed a probe down and tapped the pedicle, checked  with a small ball probe and once we had good bony circumference, we then  placed a pedicle screw.  This was done first on the right side of L3,  L4, and L5 and then repeated on the left side.  Two 55-mm screws were  used at L3 and L4 and 45-mm screw was used at L5.  We took final AP and  lateral fluoroscopic imaging __________ screws showing good position of  the interbody cages and screws.  Rods were placed in the screw heads and  locking nuts were placed and these were tightened with a final tightener  on each side.  Rest of the infused BMP was placed in the posterolateral  gutters for posterolateral fusion at L2-L5 bilaterally.  Rest of the  autograft bone was packed in the posterolateral space bilaterally for  the posterolateral fusion.  Retractors were removed.  We had good  hemostasis.  Gelfoam was placed over the lateral gutters  of the dura so  that the bone chips would not fall onto the nerve roots.  Fascia was  closed with 0 Vicryl interrupted sutures.  Subcutaneous tissue was  closed with 2-0 and 3-0 Vicryl interrupted sutures.  Skin was closed  with benzoin and Steri-Strips.  Dressing was placed.  The patient was  placed back in the supine position, reversed from anesthesia, and  transferred to the recovery room in stable condition.  ______________________________  Clydene Fake, M.D.     JRH/MEDQ  D:  09/12/2008  T:  09/13/2008  Job:  [id number]

## 2011-01-17 ENCOUNTER — Encounter: Payer: Self-pay | Admitting: Internal Medicine

## 2011-01-17 ENCOUNTER — Telehealth: Payer: Self-pay | Admitting: Internal Medicine

## 2011-01-17 ENCOUNTER — Ambulatory Visit (INDEPENDENT_AMBULATORY_CARE_PROVIDER_SITE_OTHER): Payer: BC Managed Care – PPO | Admitting: Internal Medicine

## 2011-01-17 VITALS — BP 116/70 | HR 77 | Temp 98.2°F | Resp 20 | Wt 149.0 lb

## 2011-01-17 DIAGNOSIS — J329 Chronic sinusitis, unspecified: Secondary | ICD-10-CM

## 2011-01-17 DIAGNOSIS — I10 Essential (primary) hypertension: Secondary | ICD-10-CM

## 2011-01-17 DIAGNOSIS — M1990 Unspecified osteoarthritis, unspecified site: Secondary | ICD-10-CM

## 2011-01-17 MED ORDER — TRIAMCINOLONE ACETONIDE(NASAL) 55 MCG/ACT NA INHA: 2.0000 | Freq: Every day | NASAL | Status: DC

## 2011-01-17 MED ORDER — DICLOFENAC SODIUM 1 % TD GEL: 1.0000 "application " | Freq: Four times a day (QID) | TRANSDERMAL | Status: DC

## 2011-01-17 MED ORDER — MELOXICAM 15 MG PO TABS: 15.0000 mg | ORAL_TABLET | Freq: Every day | ORAL | Status: DC | PRN

## 2011-01-17 MED ORDER — AMLODIPINE BESYLATE 5 MG PO TABS: 5.0000 mg | ORAL_TABLET | Freq: Every day | ORAL | Status: DC

## 2011-01-17 MED ORDER — LOSARTAN POTASSIUM 100 MG PO TABS: 50.0000 mg | ORAL_TABLET | Freq: Every day | ORAL | Status: DC

## 2011-01-17 NOTE — Telephone Encounter (Signed)
PLEASE FAX A LAB ORDER TO [facility] FOR THE FIRST WEEK IN November FOR BMET 401.9

## 2011-01-17 NOTE — Patient Instructions (Signed)
Please call our office if your symptoms do not improve or gets worse. Please complete the following lab tests before your next follow up appointment: BMET - [phone]

## 2011-01-17 NOTE — Telephone Encounter (Signed)
Lab order entered for [facility].

## 2011-01-17 NOTE — Telephone Encounter (Signed)
Refill- voltaren 1% gel. Apply one application topically four times daily. Qty 200. Last fill 5.17.12.  P/a [phone]. Id# [id number]

## 2011-01-17 NOTE — Progress Notes (Signed)
Subjective:    Patient ID: [name], male    DOB: 09/17/1945, 65 y.o.   MRN: [id number]  HPI  Last 10 days - feels sinus congestion.   Initially mucus color was yellow but now clear.  Also notes dyspnea + productive cough.    Review of Systems  Past Medical History  Diagnosis Date  . Allergic rhinitis   . COPD (chronic obstructive pulmonary disease)     FeV1 64%-2007  . GERD (gastroesophageal reflux disease)   . History of lung abscess     bronchiectasis with RMLandRLL ersection -1996- Dr Edwyna Shell  . Hypertension   . Osteoarthritis     hands and knees  . Emphysema   . Impaired vision     glasses    History   Social History  . Marital Status: Married    Spouse Name: N/A    Number of Children: N/A  . Years of Education: N/A   Occupational History  . retired Librarian, academic    Social History Main Topics  . Smoking status: Former Smoker    Quit date: 09/03/2000  . Smokeless tobacco: Not on file  . Alcohol Use: 1.2 - 1.8 oz/week    2-3 Cans of beer per week  . Drug Use: Not on file  . Sexually Active: Not on file   Other Topics Concern  . 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 1 1/2 ppd x 38 yrs  Quit in Jan - 2 weeks (4th marriage)divorced,  remarried 84-2004 (lost wife to lung ca),  remarried (divorced), 1 son  - 38 ([place])Alcohol use-yes (2-3 beers per week)      Past Surgical History  Procedure Date  . Lung surgery     RML andRUL removed due to bleeding and bronchiectasis and lung abcess  . Neck surgery 05/2008  . Lower back surgery 09/2008    06/2009  . Hernia repair 10/2009  . Left knee   . Right foot surgery     Family History  Problem Relation Age of Onset  . Prostate cancer Father     father died prostate ca  . Coronary artery disease      1st degree relative<60  . Stroke      1st degree relative<50  . Heart failure Mother     age 34  . Colon cancer  Paternal Grandmother     No Known Allergies  Current Outpatient Prescriptions on File Prior to Visit  Medication Sig Dispense Refill  . albuterol (PROAIR HFA) 108 (90 BASE) MCG/ACT inhaler Inhale 2 puffs into the lungs every 6 (six) hours as needed.        Marland Kitchen amLODipine (NORVASC) 5 MG tablet Take 5 mg by mouth daily.        . budesonide (PULMICORT) 180 MCG/ACT inhaler Inhale 2 puffs into the lungs 2 (two) times daily. (2 puffs one to two times per day depending upon fume exposure)      . calcium carbonate (TUMS EX) 750 MG chewable tablet Chew 1 tablet by mouth 2 (two) times daily.        Marland Kitchen glucosamine-chondroitin 500-400 MG tablet Take 1 tablet by mouth 2 (two) times daily.        Marland Kitchen guaiFENesin (MUCINEX) 600 MG 12 hr tablet Take 600 mg by mouth 2 (two) times daily.        Marland Kitchen losartan (COZAAR) 100 MG tablet Take 50 mg by  mouth daily.       . meloxicam (MOBIC) 15 MG tablet Take 1 tablet (15 mg total) by mouth daily as needed.  30 tablet  0  . Multiple Vitamin (MULTIVITAMIN) tablet Take 1 tablet by mouth 3 (three) times a week.        . triamcinolone (NASACORT) 55 MCG/ACT nasal inhaler 2 sprays by Nasal route daily. (2 sprays daily each nostril)       . diclofenac (VOLTAREN) 75 MG EC tablet Take 75 mg by mouth 2 (two) times daily.        . diclofenac sodium (VOLTAREN) 1 % GEL Apply 1 application topically 4 (four) times daily.  1 Tube  2    BP 116/70  Pulse 77  Temp(Src) 98.2 F (36.8 C) (Oral)  Resp 20  Wt 149 lb (67.586 kg)  SpO2 96%       Objective:   Physical Exam        Assessment & Plan:

## 2011-01-18 NOTE — Discharge Summary (Signed)
NAMEKAICEN, DESENA NO.:  [id number]   MEDICAL RECORD NO.:  [id number]          PATIENT TYPE:  INP   LOCATION:  3002                         FACILITY:  [facility]   PHYSICIAN:  Clydene Fake, M.D.  DATE OF BIRTH:  09-05-1945   DATE OF ADMISSION:  09/12/2008  DATE OF DISCHARGE:  09/15/2008                               DISCHARGE SUMMARY   DIAGNOSES:  Lumbar stenosis, spondylosis, unstable spondylolisthesis L2-  L3, L3-L4, L4-L5.   DISCHARGE DIAGNOSES:  Lumbar stenosis, spondylosis, unstable  spondylolisthesis L2-L3, L3-L4, L4-L5.   PROCEDURE:  Decompressive laminectomy with decompression of L2, L3, L4,  and L5 roots; posterior lumbar interbody fusion L2-L3, L3-L4, and L4-L5;  Saber interbody cages; Expedium segmented pedicle screw fixation; L2-L5  posterolateral fusion with autograft and Infuse bone morphogenetic  protein.   REASON FOR ADMISSION:  The patient is a 65 year old gentleman with back  and leg pain, trouble walking for an unstable spondylolisthesis and  severe stenosis and the patient was brought in for decompression and  fusion.   HOSPITAL COURSE:  The patient was admitted on the day of surgery and  underwent procedure without complications and postop the patient was  transferred to the recovery room and then to the intensive care unit for  observation.  Following day, he was doing great, much less leg pain than  preoperative status.  Incision clean, dry, and intact.  We started  increasing his activity.  Getting PT/OT to work for the above.  He  continued making progress and transferred to the floor on September 14, 2008.  Pulmonary status remained stable.  He continued to work on  increasing his activity.  By September 15, 2008, he was up ambulating  well, much less leg pain than preoperative status, incisional pain  controlled; incision clean, dry, and intact, and discharged home in  stable condition.  Flexeril and Percocet for p.r.n. pain and  preoperative medications and follow up in 3-4 weeks in my office.  No  strenuous activity, up with brace.           ______________________________  Clydene Fake, M.D.     JRH/MEDQ  D:  10/13/2008  T:  10/13/2008  Job:  [id number]

## 2011-01-18 NOTE — Op Note (Signed)
[facility]  Patient:    [name], [name]                      MRN: [id number] Proc. Date: 10/10/00 Adm. Date:  [id number] Disc. Date: [id number] Attending:  Cornell Barman                           Operative Report  PREOPERATIVE DIAGNOSIS:  Bucket-handle tear medial meniscus of left knee.  POSTOPERATIVE DIAGNOSIS:  Bucket-handle tear medial meniscus of left knee.  OPERATION:  Subtotal medial meniscectomy, left knee.  SURGEON:  Lenard Galloway. Chaney Malling, M.D.  ANESTHESIA:  Spinal.  PATHOLOGY:  With an arthroscope in knee, very careful examination of both compartments taken.  Medial compartment visualized first.  There was a large bucket-handle tear,  It was not a simple bucket-handle tear but marked fraying and tearing.  In the middle of the bucket-handle tear, there was a transverse tear through the entire width of the bucket handle.  There was no articular cartilage damage to the mediofemoral condyle or medial tibial plateau. Anterior cruciate ligaments were normal.  The lateral compartment was normal. Articular cartilage of lateral femoral condyle, lateral tibial plateau, and entire lateral meniscus was normal.  Patellofemoral joint was visualized, and this was normal.  DESCRIPTION OF PROCEDURE:  The patient was placed on the operating table in supine position.  A pneumatic tourniquet was placed about the left upper thigh.  The left leg was placed in leg collar, and the entire lower extremity was prepped with DuraPrep and draped in the usual manner.  The patient had a spinal.  An infusion cannula was placed in the superior medial pouch, and the knee was ______ with saline.  Anterior lateral port was was then made, and scope was introduced.  All the pathology was seen in the medial compartment. A series of baskets were inserted through both medial and lateral portals, and a part of the bucket handle was excised at either end and pulled out  with pituitary.  The rest of the bucket handle had to be debrided with a series of baskets.  Very aggressive debridement and a subtotal medial meniscectomy was accomplished very nicely.  The anterior articular shaver was introduced, and all debris was removed.  The remaining rim was smooth and balanced with nice stabilization from posterior to anterior.  No other significant pathology was seen in the knee.  A large bulky sterile dressing was applied, and the patient returned to the recovery room in excellent condition having tolerated the procedure extremely well. DD:  10/10/00 TD:  10/12/00 Job: [id number]

## 2011-01-18 NOTE — Op Note (Signed)
   NAMESHIGEO, BAUGH                         ACCOUNT NO.:  [id number]   MEDICAL RECORD NO.:  [id number]                   PATIENT TYPE:  AMB   LOCATION:  ENDO                                 FACILITY:  [facility]   PHYSICIAN:  Shan Levans, M.D. [facility]            DATE OF BIRTH:  06/21/1946   DATE OF PROCEDURE:  07/08/2002  DATE OF DISCHARGE:                                 OPERATIVE REPORT   PROCEDURE PERFORMED:  Bronchoscopy.   ENDOSCOPIST:  Shan Levans, M.D. [facility]   INDICATIONS FOR PROCEDURE:  Evaluate, right upper lobe lesion and  hemoptysis.   ANESTHESIA:  1% Xylocaine local.   PREOP MEDICATION:  Demerol 40 mg IV push, Versed 4 mg IV push.   DESCRIPTION OF PROCEDURE:  The Olympus video bronchoscope was introduced via  the right naris, the upper airways were visualized and were unremarkable.  The entire tracheobronchial tree was visualized and revealed exophytic  lesions seen totally occluding the right upper lobe orifice.  Also similar  lesions were seen occluding the right middle lobe orifice.  The right lower  lobe orifice was tortuous but patent and no lesions were seen in the right  lower lobe subsegments.  The left bronchial tree was visualized and revealed  no endobronchial lesions but trace tracheobronchitis.  Attention was then  paid to the right upper lobe.  Cytology brushings, biopsies and washings  were obtained from right middle lobe.  Bronchial biopsies were obtained.   COMPLICATIONS:  None.   IMPRESSION:  Right upper and middle lobe endobronchial lesion with  hemoptysis, suspect primary lung malignancy.   RECOMMENDATIONS:  Follow up pathology and assess candidacy for surgical  resection.                                                Shan Levans, M.D. [facility]    PW/MEDQ  D:  07/08/2002  T:  07/08/2002  Job:  [id number]

## 2011-01-18 NOTE — Telephone Encounter (Signed)
Call placed to [facility] [phone] for Prior  Auth on Voltaren Gel, Aldi. Volatren Gel Approved from 01/18/2011 through 01/18/2012, with a $30 copay.  Call placed to [facility] at [phone], spoke with Bonita Quin, she was informed Rx approved and was advised to re-run Rx.

## 2011-01-18 NOTE — Assessment & Plan Note (Signed)
[facility]                             PULMONARY OFFICE NOTE   NAME:[name], [name]                      MRN:          [id number]  DATE:11/06/2006                            DOB:          [birth year]    [name] returns today in followup and is doing well without any  respiratory complaints, doing well on the Pulmicort 2 sprays daily, on  the Hyzaar 50/12.5 daily, Nasacort 2 sprays each nostril daily.   EXAMINATION:  Temperature 98, blood pressure 110/78, pulse 85,  saturation 95% room air.  CHEST:  Showed distant breath sounds without evidence of wheeze or  rhonchi.  CARDIAC: Showed a regular rate and rhythm without S3, normal S1, S2.  ABDOMEN:  Soft nontender.  EXTREMITIES:  Showed no edema or clubbing.  SKIN:  Clear.  NEUROLOGIC:  Intact.  HEENT:  Showed no jugular venous distention, no lymphadenopathy,  oropharynx clear.  NECK:  Supple   IMPRESSION:  Chronic obstructive lung disease, asthmatic bronchitic  component.  Stable at this time.   PLAN:  Maintain inhaled medicines as currently dosed, 2 sprays daily on  Pulmicort, 2 sprays daily on Nasacort each nostril, albuterol p.r.n.  We  will see the patient back in return followup in 4 months.     Charlcie Cradle Delford Field, MD, [facility]  Electronically Signed    PEW/MedQ  DD: 11/06/2006  DT: 11/06/2006  Job #: [id number]

## 2011-01-18 NOTE — Op Note (Signed)
[name], [name] NO.:  [id number]   MEDICAL RECORD NO.:  [id number]          PATIENT TYPE:  OIB   LOCATION:  2899                         FACILITY:  [facility]   PHYSICIAN:  Suzanna Obey, M.D.       DATE OF BIRTH:  12-04-1945   DATE OF PROCEDURE:  12/19/2004  DATE OF DISCHARGE:                                 OPERATIVE REPORT   PREOPERATIVE DIAGNOSIS:  Chronic sinusitis and possible subglottic stenosis.   POSTOPERATIVE DIAGNOSIS:  Chronic sinusitis and possible subglottic  stenosis, tracheal narrowing.   ANESTHESIA:  General tracheal tube.   PROCEDURE:  Bilateral maxillary antrostomy, bilateral ethmoidectomy,  bilateral frontal sinusotomy, stealth computer guidance, and direct  laryngoscopy and bronchoscopy.   ANESTHESIA:  General endotracheal tube.   ESTIMATED BLOOD LOSS:  Approximately 50 cc.   INDICATIONS:  This is a 65 year old who has had significant problems with  asthma, very difficult to control. He has had a bronchoscopy performed by  Dr. Delford Field and no findings apparently were identified. The patient has had  repetitive sinusitis episodes, and it is felt that his sinus problems are  exacerbating his asthma issues. He does have breathing that sometimes sounds  very loud and almost stridorous. He does not seem to have any exercise  intolerance because of this. Apparently, nothing was identified on the  bronchoscopy previously. Because of the location of the stridor in the neck  area, it was felt a look would be appropriate during his general anesthesia  to see if it was any anatomic issues. He was informed of the risk and  benefits of the procedure including bleeding, infection, scarring, CSF leak,  change in the sense of smell, blindness, scarring of the trachea and injury  to the trachea and pharynx with endoscopy. All of his questions were  answered and consent was obtained.   OPERATION:  The patient was taken to the operating room and placed in  supine  position. After adequate general endotracheal tube anesthesia, was placed in  the supine position, prepped and draped in usual sterile manner. The stealth  system was positioned on his forehead and calibrated. The nose was injected  with 1% lidocaine with 1:100,000 epinephrine in the inferior turbinate and  middle turbinate. The left side was began using the stealth guidance. The  antrostomy was performed, making a retrograde removal of the uncinate  process, opening up the maxillary antrostomy widely. This mucosa was  somewhat thickened. The ethmoid was then opened using stealth guidance, from  posterior to anterior ethmoids were opened. There was a lot of inflammation  of the ethmoid, especially up around the midportion of the ethmoid cavity.  The frontal was then opened with a curved frontal seeker stealth system, and  it opened up nicely. The oxymetazoline pledget was placed in the ethmoid  cavity, and the right size was repeated the in same fashion with uncinate  removed. There was an accessory ostia that was opened and connected to the  natural ostia. The bulla was opened, and the ethmoid was dissected from  posterior to anterior, and the nasal frontal duct  was opened, all using  InstaTrak guidance and somewhat thickened mucosa. Nasopharynx was suctioned  out of all blood and debris, and the Kennedy packs were placed into the  ethmoid cavities bilaterally, was soaked bacitracin and injected with  saline. They were loosely tied across the columella. Oral cavity and  oropharynx were suctioned out, and a direct laryngoscopy was performed. The  glottis was easily identified. There did not appear to be any lesions of the  glottis or vocal cords. The endotracheal tube was removed. There was  absolutely no subglottic stenosis or narrowing. The rigid bronchoscopy  endoscope was placed, and there was narrowing but not significant of the  trachea at the innominate artery, and you could  see the pulsation. There was  a somewhat odd twist to his trachea just before it got to the carina, but it  was well opened and no obvious obstruction at this point. There was no  lesions. The scope was removed. An endotracheal tube was repositioned, and  the patient was then awakened, brought to recovery in stable condition.  Counts correct.      JB/MEDQ  D:  12/19/2004  T:  12/19/2004  Job:  [id number]   cc:   Wyvonnia Lora  [address]  [place]  [place] [address]  Fax: [phone]   Shan Levans, M.D. [facility]

## 2011-01-18 NOTE — Op Note (Signed)
   [name], [name]                         ACCOUNT NO.:  [id number]   MEDICAL RECORD NO.:  [id number]                   PATIENT TYPE:  AMB   LOCATION:  ENDO                                 FACILITY:  [facility]   PHYSICIAN:  Shan Levans, M.D. [facility]            DATE OF BIRTH:  July 02, 1946   DATE OF PROCEDURE:  07/08/2002  DATE OF DISCHARGE:                                 OPERATIVE REPORT   ADDENDUM   PROCEDURE PERFORMED:  Bronchoscopy.   OPERATOR:  Shan Levans, M.D. [facility]   The history is now obtained that the patient had a right and right middle  lobe lobectomy in the past for lung abscess without malignancy.  This  occurred in 1996.  The airway exam noted lesions in the right upper and  right middle lobe areas.  In retrospect these area areas where there is  bronchial stump and suture line; however, there are nodular looking areas in  the right upper lobe stump and right middle lobe stump which may be  compatible with endobronchial tumor versus inflammatory process.  Biopsies  had been obtained of these areas and will be followed up.                                               Shan Levans, M.D. [facility]    PW/MEDQ  D:  07/08/2002  T:  07/08/2002  Job:  [id number]

## 2011-01-18 NOTE — Assessment & Plan Note (Signed)
[facility]                               PULMONARY OFFICE NOTE   NAME:Jurek, INMAN FETTIG                      MRN:          [id number]  DATE:05/21/2006                            DOB:          1946/03/16    Mr. Stoiber returns today in followup.  He is a 65 year old white male here for  a 34-month followup and doing well with no complaints.  He has done well  since his original sinus surgery, maintaining on Pulmicort 2 sprays daily,  Nasacort 2 sprays each nostril daily, saline irrigation daily.   On exam temperature is 97.  Blood pressure 100/60.  Pulse 72.  Saturation  98% room air.  CHEST:  Showed to be completely clear without evidence of wheeze, rale or  rhonchi.  CARDIAC:  Showed a regular rate and rhythm without S3.  Normal S1, S2.  ABDOMEN:  Soft, nontender.  EXTREMITIES:  Showed no edema or clubbing.  SKIN:  Clear.  NEUROLOGIC:  Intact.  HEENT:  No jugular venous distention, no lymphadenopathy, oropharynx clear.  NECK:  Supple.   Spirometry obtained today showed moderate obstructive defect with an FEV1 of  62% of predicted.  FVC of 91% of predicted.  FEV1 to FVC ratio 54% of  predicted.   IMPRESSION:  Stable chronic obstructive lung disease with asthmatic  bronchitic component.   The plan is for the patient to maintain Pulmicort at 2 sprays daily,  albuterol p.r.n., Nasacort 2 sprays each nostril daily.  Refills were given.  Return the patient in 6 months.                                   Charlcie Cradle Delford Field, MD, FCCP   PEW/MedQ  DD:  05/21/2006  DT:  05/23/2006  Job #:  [id number]   cc:   Wyvonnia Lora

## 2011-02-11 ENCOUNTER — Ambulatory Visit: Payer: Self-pay | Admitting: Internal Medicine

## 2011-04-05 ENCOUNTER — Telehealth: Payer: Self-pay | Admitting: Internal Medicine

## 2011-04-05 NOTE — Telephone Encounter (Signed)
Patient has an appt in November and is unsure if he needs to come in for labs prior. You can also reach him on his cell [phone]

## 2011-04-05 NOTE — Telephone Encounter (Signed)
Order was sent to the lab previously.

## 2011-04-05 NOTE — Telephone Encounter (Signed)
Pt is aware.  

## 2011-06-03 LAB — CBC
HCT: 40.3
Hemoglobin: 13.8
MCHC: 34.3
MCV: 93.1
Platelets: 294
RBC: 4.33
RDW: 12.8
WBC: 6.2

## 2011-06-03 LAB — BASIC METABOLIC PANEL
BUN: 25 — ABNORMAL HIGH
CO2: 26
Calcium: 9.1
Chloride: 105
Creatinine, Ser: 0.82
GFR calc Af Amer: >60
GFR calc non Af Amer: >60
Glucose, Bld: 102 — ABNORMAL HIGH
Potassium: 4
Sodium: 137

## 2011-06-03 LAB — URINALYSIS, ROUTINE W REFLEX MICROSCOPIC
Bilirubin Urine: NEGATIVE
Glucose, UA: NEGATIVE
Hgb urine dipstick: NEGATIVE
Ketones, ur: NEGATIVE
Nitrite: NEGATIVE
Protein, ur: NEGATIVE
Specific Gravity, Urine: 1.01
Urobilinogen, UA: 0.2
pH: 6.5

## 2011-06-03 LAB — APTT: aPTT: 31

## 2011-06-03 LAB — PROTIME-INR
INR: 1
Prothrombin Time: 13.8

## 2011-06-24 ENCOUNTER — Telehealth: Payer: Self-pay | Admitting: Critical Care Medicine

## 2011-06-24 NOTE — Telephone Encounter (Signed)
I spoke with pt and he states he thinks he may be developing bronchitis. Pt stated he would like to be seen today if possible but if not he would like to be seen tomorrow. No available openings today so pt is scheduled to come in and see TP tomorrow at 2:45. Pt aware if he were to get worse overnight to see emergency care. Nothing further was needed

## 2011-06-25 ENCOUNTER — Ambulatory Visit: Payer: BC Managed Care – PPO | Admitting: Adult Health

## 2011-06-25 ENCOUNTER — Ambulatory Visit (INDEPENDENT_AMBULATORY_CARE_PROVIDER_SITE_OTHER): Payer: BC Managed Care – PPO | Admitting: Internal Medicine

## 2011-06-25 ENCOUNTER — Encounter: Payer: Self-pay | Admitting: Internal Medicine

## 2011-06-25 ENCOUNTER — Ambulatory Visit (INDEPENDENT_AMBULATORY_CARE_PROVIDER_SITE_OTHER)
Admission: RE | Admit: 2011-06-25 | Discharge: 2011-06-25 | Disposition: A | Discharge status: Home / Self Care | Payer: BC Managed Care – PPO | Source: Ambulatory Visit | Attending: Internal Medicine | Admitting: Internal Medicine

## 2011-06-25 VITALS — BP 140/80 | HR 78 | Ht 68.0 in | Wt 150.4 lb

## 2011-06-25 DIAGNOSIS — J449 Chronic obstructive pulmonary disease, unspecified: Secondary | ICD-10-CM

## 2011-06-25 MED ORDER — DOXYCYCLINE HYCLATE 100 MG PO TABS: ORAL_TABLET | ORAL | Status: DC

## 2011-06-25 MED ORDER — PREDNISONE 10 MG PO TABS: ORAL_TABLET | ORAL | Status: DC

## 2011-06-25 NOTE — Progress Notes (Signed)
06/25/11- 65 year old white male former smoker who has been followed by Dr. Delford Field for COPD/asthmatic bronchitis, history of lung abscess. History of partial pneumonectomy 1996 for recurrent bronchitis/abscess. Last here/Dr. Delford Field 10/18/2010. Seen today as a work in with acute complaint-for 4 days has had sore throat, yellow nasal discharge now moving into his chest, low-grade fever, mild headache. He suggests a prednisone taper. At baseline he denies daily phlegm or scant clear sputum only. Last chest x-ray was 2010. Says he had moved to a house located where he was exposed to a lot of traffic exhaust. He has now moved again, out to a role area with clean air, as of May 2012.   ROS-see HPI Constitutional:   No-   weight loss, night sweats, +fevers,  No- chills, fatigue, lassitude. HEENT:   No-  headaches, difficulty swallowing, tooth/dental problems, sore throat,       No-  sneezing, itching, ear ache, nasal congestion, post nasal drip,  CV:  No-   chest pain, orthopnea, PND, swelling in lower extremities, anasarca, dizziness, palpitations Resp: No-   shortness of breath with exertion or at rest.             + productive cough,  No non-productive cough,  No- coughing up of blood.              +  change in color of mucus.  No- wheezing.   Skin: No-   rash or lesions. GI:  No-   heartburn, indigestion, abdominal pain, nausea, vomiting, diarrhea,                 change in bowel habits, loss of appetite GU: No-   dysuria, change in color of urine, no urgency or frequency.  No- flank pain. MS:  No-   joint pain or swelling.  No- decreased range of motion.  No- back pain. Neuro-     nothing unusual Psych:  No- change in mood or affect. No depression or anxiety.  No memory loss.  OBJ General- Alert, Oriented, Affect-appropriate, Distress- none acute Skin- rash-none, lesions- none, excoriation- none Lymphadenopathy- none Head- atraumatic            Eyes- Gross vision intact, PERRLA, conjunctivae  clear secretions            Ears- Hearing, canals-normal            Nose- Clear, no-Septal dev, mucus, polyps, erosion, perforation             Throat- Mallampati II , mucosa clear , drainage- none, tonsils- atrophic Neck- flexible , trachea midline, no stridor , thyroid nl, carotid no bruit Chest - symmetrical excursion , unlabored           Heart/CV- RRR , no murmur , no gallop  , no rub, nl s1 s2                           - JVD- none , edema- none, stasis changes- none, varices- none           Lung- coarse breath sounds, wheeze- none, cough- none , dullness-none, rub- none           Chest wall-  Abd- tender-no, distended-no, bowel sounds-present, HSM- no Br/ Gen/ Rectal- Not done, not indicated Extrem- cyanosis- none, clubbing, none, atrophy- none, strength- nl Neuro- grossly intact to observation

## 2011-06-25 NOTE — Patient Instructions (Signed)
Script sent for doxycycline antibiotic  Script for prednisone taper to hold  Order CXR-   Dx COPD

## 2011-06-28 NOTE — Assessment & Plan Note (Addendum)
Acute bronchitic exacerbation of COPD. We discussed and respect his potential to get worse. Plan-doxycycline, prednisone taper, CXR

## 2011-07-05 NOTE — Progress Notes (Signed)
Quick Note:  Pt aware of results. ______ 

## 2011-07-09 ENCOUNTER — Telehealth: Payer: Self-pay | Admitting: *Deleted

## 2011-07-09 DIAGNOSIS — I10 Essential (primary) hypertension: Secondary | ICD-10-CM

## 2011-07-09 LAB — BASIC METABOLIC PANEL
BUN: 21 mg/dL (ref 6–23)
CO2: 25 mEq/L (ref 19–32)
Calcium: 9.3 mg/dL (ref 8.4–10.5)
Chloride: 99 mEq/L (ref 96–112)
Creat: 0.95 mg/dL (ref 0.50–1.35)
Glucose, Bld: 95 mg/dL (ref 70–99)
Potassium: 4.3 mEq/L (ref 3.5–5.3)
Sodium: 133 mEq/L — ABNORMAL LOW (ref 135–145)

## 2011-07-09 NOTE — Telephone Encounter (Signed)
Pt returned to the lab for bloodwork prior to appt. Order entered for BMP 401.9 per 01/17/11 office note and forwarded to the lab.

## 2011-07-16 ENCOUNTER — Ambulatory Visit: Payer: BC Managed Care – PPO | Admitting: Internal Medicine

## 2011-07-16 ENCOUNTER — Encounter: Payer: Self-pay | Admitting: Internal Medicine

## 2011-07-16 ENCOUNTER — Ambulatory Visit (INDEPENDENT_AMBULATORY_CARE_PROVIDER_SITE_OTHER): Payer: BC Managed Care – PPO | Admitting: Internal Medicine

## 2011-07-16 VITALS — BP 130/70 | HR 84 | Temp 98.1°F | Resp 18 | Ht 67.5 in | Wt 153.0 lb

## 2011-07-16 DIAGNOSIS — Z2911 Encounter for prophylactic immunotherapy for respiratory syncytial virus (RSV): Secondary | ICD-10-CM

## 2011-07-16 DIAGNOSIS — Z0000 Encounter for general adult medical examination without abnormal findings: Secondary | ICD-10-CM

## 2011-07-16 DIAGNOSIS — R0989 Other specified symptoms and signs involving the circulatory and respiratory systems: Secondary | ICD-10-CM

## 2011-07-16 DIAGNOSIS — Z23 Encounter for immunization: Secondary | ICD-10-CM

## 2011-07-16 DIAGNOSIS — M1990 Unspecified osteoarthritis, unspecified site: Secondary | ICD-10-CM

## 2011-07-16 LAB — HEPATIC FUNCTION PANEL
ALT: 18 U/L (ref 0–53)
AST: 22 U/L (ref 0–37)
Albumin: 4.1 g/dL (ref 3.5–5.2)
Alkaline Phosphatase: 71 U/L (ref 39–117)
Bilirubin, Direct: 0.2 mg/dL (ref 0.0–0.3)
Indirect Bilirubin: 0.7 mg/dL (ref 0.0–0.9)
Total Bilirubin: 0.9 mg/dL (ref 0.3–1.2)
Total Protein: 6.7 g/dL (ref 6.0–8.3)

## 2011-07-16 LAB — BASIC METABOLIC PANEL
BUN: 23 mg/dL (ref 6–23)
CO2: 26 mEq/L (ref 19–32)
Calcium: 9.4 mg/dL (ref 8.4–10.5)
Chloride: 104 mEq/L (ref 96–112)
Creat: 0.81 mg/dL (ref 0.50–1.35)
Glucose, Bld: 83 mg/dL (ref 70–99)
Potassium: 4.5 mEq/L (ref 3.5–5.3)
Sodium: 138 mEq/L (ref 135–145)

## 2011-07-16 LAB — PSA: PSA: 1.46 ng/mL (ref <=4.00)

## 2011-07-16 LAB — CBC
HCT: 43.2 % (ref 39.0–52.0)
Hemoglobin: 14.4 g/dL (ref 13.0–17.0)
MCH: 31 pg (ref 26.0–34.0)
MCHC: 33.3 g/dL (ref 30.0–36.0)
MCV: 92.9 fL (ref 78.0–100.0)
Platelets: 291 10*3/uL (ref 150–400)
RBC: 4.65 MIL/uL (ref 4.22–5.81)
RDW: 13.8 % (ref 11.5–15.5)
WBC: 10.6 10*3/uL — ABNORMAL HIGH (ref 4.0–10.5)

## 2011-07-16 LAB — TSH: TSH: 0.679 u[IU]/mL (ref 0.350–4.500)

## 2011-07-16 LAB — LIPID PANEL
Cholesterol: 168 mg/dL (ref 0–200)
HDL: 56 mg/dL (ref >39)
LDL Cholesterol: 80 mg/dL (ref 0–99)
Total CHOL/HDL Ratio: 3 Ratio
Triglycerides: 158 mg/dL — ABNORMAL HIGH (ref <150)
VLDL: 32 mg/dL (ref 0–40)

## 2011-07-16 MED ORDER — DICLOFENAC SODIUM 1 % TD GEL: 1.0000 "application " | Freq: Four times a day (QID) | TRANSDERMAL | Status: DC

## 2011-07-16 MED ORDER — AMLODIPINE BESYLATE 5 MG PO TABS: 5.0000 mg | ORAL_TABLET | Freq: Every day | ORAL | Status: DC

## 2011-07-16 MED ORDER — LOSARTAN POTASSIUM 100 MG PO TABS: 50.0000 mg | ORAL_TABLET | Freq: Every day | ORAL | Status: DC

## 2011-07-16 MED ORDER — LEVOFLOXACIN 500 MG PO TABS: 500.0000 mg | ORAL_TABLET | Freq: Every day | ORAL | Status: AC

## 2011-07-16 MED ORDER — MELOXICAM 15 MG PO TABS: 15.0000 mg | ORAL_TABLET | Freq: Every day | ORAL | Status: DC | PRN

## 2011-07-16 MED ORDER — TRIAMCINOLONE ACETONIDE(NASAL) 55 MCG/ACT NA INHA: 2.0000 | Freq: Every day | NASAL | Status: DC

## 2011-07-16 MED ORDER — ALBUTEROL SULFATE HFA 108 (90 BASE) MCG/ACT IN AERS: 2.0000 | INHALATION_SPRAY | Freq: Four times a day (QID) | RESPIRATORY_TRACT | Status: DC | PRN

## 2011-07-16 NOTE — Patient Instructions (Signed)
Please schedule carotid ultrasound of your neck

## 2011-07-16 NOTE — Progress Notes (Signed)
  Subjective:    Patient ID: [name], male    DOB: [birth year], 65 y.o.   MRN: [id number]  HPI Pt presents to clinic for annual exam. Believes may have sinus infection with sinus pain and congestion. No f/c. No other complaints.  Past Medical History  Diagnosis Date  . Allergic rhinitis   . COPD (chronic obstructive pulmonary disease)     FeV1 64%-2007  . GERD (gastroesophageal reflux disease)   . History of lung abscess     bronchiectasis with RMLandRLL ersection -1996- Dr Edwyna Shell  . Hypertension   . Osteoarthritis     hands and knees  . Emphysema   . Impaired vision     glasses   Past Surgical History  Procedure Date  . Lung surgery     RML andRUL removed due to bleeding and bronchiectasis and lung abcess  . Neck surgery 05/2008  . Lower back surgery 09/2008    06/2009  . Hernia repair 10/2009  . Left knee   . Right foot surgery     reports that he quit smoking about 10 years ago. He has never used smokeless tobacco. He reports that he drinks about 1.2 - 1.8 ounces of alcohol per week. His drug history not on file. family history includes Colon cancer in his paternal grandmother; Coronary artery disease in an unspecified family member; Heart failure in his mother; Prostate cancer in his father; and Stroke in an unspecified family member. No Known Allergies     Review of Systems see hpi     Objective:   Physical Exam  Physical Exam  Nursing note and vitals reviewed. Constitutional: He appears well-developed and well-nourished. No distress.  HENT:  Head: Normocephalic and atraumatic.  Right Ear: Tympanic membrane and external ear normal.  Left Ear: Tympanic membrane and external ear normal.  Nose: Nose normal.  Mouth/Throat: Uvula is midline, oropharynx is clear and moist and mucous membranes are normal. No oropharyngeal exudate.  Eyes: Conjunctivae and EOM are normal. Pupils are equal, round, and reactive to light. Right eye exhibits no discharge. Left eye  exhibits no discharge. No scleral icterus.  Neck: Neck supple. Faint bilateral carotid bruits noted. No thyromegaly present.  Cardiovascular: Normal rate, regular rhythm and normal heart sounds.  Exam reveals no gallop and no friction rub.   No murmur heard. Pulmonary/Chest: Effort normal and breath sounds normal. No respiratory distress. He has no wheezes. He has no rales.  Abdominal: Soft. He exhibits no distension and no mass. There is no hepatosplenomegaly. There is no tenderness. There is no rebound. Hernia confirmed negative in the right inguinal area and confirmed negative in the left inguinal area.  Genitourinary:Right testis shows no mass, no swelling and no tenderness. Right testis is descended. Left testis shows no mass, no swelling and no tenderness. Left testis is descended.  Lymphadenopathy:    He has no cervical adenopathy.       Right: No inguinal adenopathy present.       Left: No inguinal adenopathy present.  Neurological: He is alert.  Skin: Skin is warm and dry. He is not diaphoretic.  Psychiatric: He has a normal mood and affect.        Assessment & Plan:

## 2011-07-17 LAB — URINALYSIS, ROUTINE W REFLEX MICROSCOPIC
Bilirubin Urine: NEGATIVE
Glucose, UA: NEGATIVE mg/dL
Hgb urine dipstick: NEGATIVE
Ketones, ur: NEGATIVE mg/dL
Leukocytes, UA: NEGATIVE
Nitrite: NEGATIVE
Protein, ur: NEGATIVE mg/dL
Specific Gravity, Urine: 1.023 (ref 1.005–1.030)
Urobilinogen, UA: 0.2 mg/dL (ref 0.0–1.0)
pH: 6 (ref 5.0–8.0)

## 2011-07-21 ENCOUNTER — Encounter: Payer: Self-pay | Admitting: Internal Medicine

## 2011-07-21 DIAGNOSIS — Z0000 Encounter for general adult medical examination without abnormal findings: Secondary | ICD-10-CM | POA: Insufficient documentation

## 2011-07-21 DIAGNOSIS — R0989 Other specified symptoms and signs involving the circulatory and respiratory systems: Secondary | ICD-10-CM | POA: Insufficient documentation

## 2011-07-21 NOTE — Assessment & Plan Note (Signed)
Schedule carotid US

## 2011-07-21 NOTE — Assessment & Plan Note (Signed)
Nl exam. Obtain screening labs. tx sinusitis with levaquin. Followup if no improvement or worsening.

## 2011-07-22 ENCOUNTER — Other Ambulatory Visit: Payer: Self-pay | Admitting: Cardiology

## 2011-07-22 DIAGNOSIS — R0989 Other specified symptoms and signs involving the circulatory and respiratory systems: Secondary | ICD-10-CM

## 2011-07-24 ENCOUNTER — Encounter (INDEPENDENT_AMBULATORY_CARE_PROVIDER_SITE_OTHER): Payer: BC Managed Care – PPO | Admitting: Cardiology

## 2011-07-24 DIAGNOSIS — R0989 Other specified symptoms and signs involving the circulatory and respiratory systems: Secondary | ICD-10-CM

## 2011-07-24 DIAGNOSIS — I6529 Occlusion and stenosis of unspecified carotid artery: Secondary | ICD-10-CM

## 2011-12-05 ENCOUNTER — Encounter: Payer: Self-pay | Admitting: Critical Care Medicine

## 2011-12-05 ENCOUNTER — Ambulatory Visit (INDEPENDENT_AMBULATORY_CARE_PROVIDER_SITE_OTHER): Payer: Medicare Other | Admitting: Critical Care Medicine

## 2011-12-05 VITALS — BP 160/80 | HR 91 | Temp 98.1°F | Ht 68.0 in | Wt 154.0 lb

## 2011-12-05 DIAGNOSIS — J329 Chronic sinusitis, unspecified: Secondary | ICD-10-CM

## 2011-12-05 DIAGNOSIS — J449 Chronic obstructive pulmonary disease, unspecified: Secondary | ICD-10-CM

## 2011-12-05 DIAGNOSIS — J852 Abscess of lung without pneumonia: Secondary | ICD-10-CM

## 2011-12-05 MED ORDER — LEVOFLOXACIN 500 MG PO TABS: 500.0000 mg | ORAL_TABLET | Freq: Every day | ORAL | Status: AC

## 2011-12-05 MED ORDER — BUDESONIDE 180 MCG/ACT IN AEPB: INHALATION_SPRAY | RESPIRATORY_TRACT | Status: DC

## 2011-12-05 MED ORDER — PREDNISONE 10 MG PO TABS: ORAL_TABLET | ORAL | Status: DC

## 2011-12-05 NOTE — Patient Instructions (Signed)
Levaquin one daily for 10days Prednisone 10mg  Take 4 for three days 3 for three days 2 for three days 1 for three days and stop Both sent to pharmacy downstairs Increase pulmicort two puff twice daily for 10days then reduce to daily Return 6  months or sooner if necessary

## 2011-12-05 NOTE — Progress Notes (Signed)
Subjective:    Patient ID: [name], male    DOB: [birth year], 66 y.o.   MRN: [id number]  HPI 06/25/11- 71 year old white male former smoker who has been followed by Dr. Delford Field for COPD/asthmatic bronchitis, history of lung abscess. History of partial pneumonectomy 1996 for recurrent bronchitis/abscess. Last here/Dr. Delford Field 10/18/2010. Seen today as a work in with acute complaint-for 4 days has had sore throat, yellow nasal discharge now moving into his chest, low-grade fever, mild headache. He suggests a prednisone taper. At baseline he denies daily phlegm or scant clear sputum only. Last chest x-ray was 2010. Says he had moved to a house located where he was exposed to a lot of traffic exhaust. He has now moved again, out to a role area with clean air, as of May 2012.  12/05/2011 Since spring started, sinuses inflammed, burned and draining.  Green mucus from sinuses.  Chest feels congested.  Notes more prod cough yellow mucus.  Notes sinus pressure.  No fever, but chills noted.  Difficulty with back exercises.  Using SABA more   Review of Systems Constitutional:   No  weight loss, night sweats,  Fevers, chills, fatigue, lassitude. HEENT:   No headaches,  Difficulty swallowing,  Tooth/dental problems, NO Sore throat,                Notes  sneezing, itching, ear ache,notes  nasal congestion,notes post nasal drip,   CV:  No chest pain,  Orthopnea, PND, swelling in lower extremities, anasarca, dizziness, palpitations  GI  No heartburn, indigestion, abdominal pain, nausea, vomiting, diarrhea, change in bowel habits, loss of appetite  Resp: Notes  shortness of breath with exertion not  at rest.  Notes  excess mucus, notes  productive cough,  No non-productive cough,  No coughing up of blood.  Notes  change in color of mucus.  No wheezing.  No chest wall deformity  Skin: no rash or lesions.  GU: no dysuria, change in color of urine, no urgency or frequency.  No flank pain.  MS:  No joint  pain or swelling.  No decreased range of motion.  No back pain.  Psych:  No change in mood or affect. No depression or anxiety.  No memory loss.     Objective:   Physical Exam  Filed Vitals:   12/05/11 1037  BP: 160/80  Pulse: 91  Temp: 98.1 F (36.7 C)  TempSrc: Oral  Height: 5\' 8"  (1.727 m)  Weight: 69.854 kg (154 lb)  SpO2: 96%    Gen: Pleasant, well-nourished, in no distress,  normal affect  ENT: No lesions,  mouth clear,  oropharynx clear, +++postnasal drip, nasal purulence  Neck: No JVD, no TMG, no carotid bruits  Lungs: No use of accessory muscles, no dullness to percussion, exp wheeze R>L Cardiovascular: RRR, heart sounds normal, no murmur or gallops, no peripheral edema  Abdomen: soft and NT, no HSM,  BS normal  Musculoskeletal: No deformities, no cyanosis or clubbing  Neuro: alert, non focal  Skin: Warm, no lesions or rashes  No results found.       Assessment & Plan:   SINUSITIS, CHRONIC Acute on chronic sinusitis with AB flare Plan Increase pulmicort Pulse prednisone Levaquin x 10days Sinus rinse  COPD Copd flare d/t sinusitis flare Plan See sinusitis assessment    Updated Medication List Outpatient Encounter Prescriptions as of 12/05/2011  Medication Sig Dispense Refill  . albuterol (PROAIR HFA) 108 (90 BASE) MCG/ACT inhaler Inhale 2 puffs into the lungs  every 6 (six) hours as needed.  18 g  5  . amLODipine (NORVASC) 5 MG tablet Take 1 tablet (5 mg total) by mouth daily.  90 tablet  1  . aspirin-acetaminophen-caffeine (EXCEDRIN MIGRAINE) 250-250-65 MG per tablet Take 1 tablet by mouth every 6 (six) hours as needed.      . budesonide (PULMICORT) 180 MCG/ACT inhaler Two puff twice daily for 10days then reduce to daily      . calcium carbonate (TUMS EX) 750 MG chewable tablet Chew 1 tablet by mouth 2 (two) times daily.        . diclofenac sodium (VOLTAREN) 1 % GEL Apply 1 application topically 4 (four) times daily as needed.      Marland Kitchen  glucosamine-chondroitin 500-400 MG tablet Take 1 tablet by mouth 2 (two) times daily.        Marland Kitchen guaiFENesin (MUCINEX) 600 MG 12 hr tablet Take 600 mg by mouth 2 (two) times daily.        Marland Kitchen losartan (COZAAR) 100 MG tablet Take 0.5 tablets (50 mg total) by mouth daily.  90 tablet  1  . meloxicam (MOBIC) 15 MG tablet Take 1 tablet (15 mg total) by mouth daily as needed.  90 tablet  1  . Multiple Vitamin (MULTIVITAMIN) tablet Take 1 tablet by mouth 3 (three) times a week.        . triamcinolone (NASACORT) 55 MCG/ACT nasal inhaler Place 2 sprays into the nose daily. (2 sprays daily each nostril)  1 Inhaler  3  . DISCONTD: budesonide (PULMICORT) 180 MCG/ACT inhaler Inhale 2 puffs into the lungs daily.       Marland Kitchen DISCONTD: diclofenac sodium (VOLTAREN) 1 % GEL Apply 1 application topically 4 (four) times daily.  2 Tube  5  . levofloxacin (LEVAQUIN) 500 MG tablet Take 1 tablet (500 mg total) by mouth daily.  10 tablet  0  . predniSONE (DELTASONE) 10 MG tablet Take 4 for three days 3 for three days 2 for three days 1 for three days and stop  30 tablet  0

## 2011-12-06 NOTE — Assessment & Plan Note (Addendum)
Acute on chronic sinusitis with AB flare Plan Increase pulmicort Pulse prednisone Levaquin x 10days Sinus rinse

## 2011-12-06 NOTE — Assessment & Plan Note (Signed)
Copd flare d/t sinusitis flare Plan See sinusitis assessment

## 2011-12-24 ENCOUNTER — Telehealth: Payer: Self-pay | Admitting: Internal Medicine

## 2011-12-24 MED ORDER — MELOXICAM 15 MG PO TABS: 15.0000 mg | ORAL_TABLET | Freq: Every day | ORAL | Status: DC | PRN

## 2011-12-24 NOTE — Telephone Encounter (Signed)
Refill- meloxicam 15mg  tab. Take one tablet by mouth every day as needed. Qty 90 last fill 2.4.13

## 2011-12-24 NOTE — Telephone Encounter (Signed)
#  90  rf 1 

## 2011-12-24 NOTE — Telephone Encounter (Signed)
Rx refill sent to pharmacy. 

## 2012-01-01 ENCOUNTER — Telehealth: Payer: Self-pay | Admitting: Internal Medicine

## 2012-01-01 MED ORDER — TRIAMCINOLONE ACETONIDE(NASAL) 55 MCG/ACT NA INHA: 2.0000 | Freq: Every day | NASAL | Status: DC

## 2012-01-01 NOTE — Telephone Encounter (Signed)
Rx refill sent to pharmacy. 

## 2012-01-01 NOTE — Telephone Encounter (Signed)
Refill- nasacort aq 39mcg/acaer. Use two sprays in each nostril every day. Qty 17 last fill 3.19.13

## 2012-01-13 ENCOUNTER — Telehealth: Payer: Self-pay | Admitting: Internal Medicine

## 2012-01-13 ENCOUNTER — Ambulatory Visit (INDEPENDENT_AMBULATORY_CARE_PROVIDER_SITE_OTHER): Payer: Medicare Other | Admitting: Internal Medicine

## 2012-01-13 ENCOUNTER — Encounter: Payer: Self-pay | Admitting: Internal Medicine

## 2012-01-13 VITALS — BP 120/72 | HR 75 | Temp 98.1°F | Resp 20 | Ht 67.5 in | Wt 154.0 lb

## 2012-01-13 DIAGNOSIS — J449 Chronic obstructive pulmonary disease, unspecified: Secondary | ICD-10-CM

## 2012-01-13 DIAGNOSIS — Z79899 Other long term (current) drug therapy: Secondary | ICD-10-CM

## 2012-01-13 DIAGNOSIS — I10 Essential (primary) hypertension: Secondary | ICD-10-CM

## 2012-01-13 DIAGNOSIS — Z125 Encounter for screening for malignant neoplasm of prostate: Secondary | ICD-10-CM

## 2012-01-13 DIAGNOSIS — Z0000 Encounter for general adult medical examination without abnormal findings: Secondary | ICD-10-CM

## 2012-01-13 DIAGNOSIS — M1990 Unspecified osteoarthritis, unspecified site: Secondary | ICD-10-CM

## 2012-01-13 LAB — BASIC METABOLIC PANEL
BUN: 23 mg/dL (ref 6–23)
CO2: 26 mEq/L (ref 19–32)
Calcium: 9.4 mg/dL (ref 8.4–10.5)
Chloride: 104 mEq/L (ref 96–112)
Creat: 0.85 mg/dL (ref 0.50–1.35)
Glucose, Bld: 103 mg/dL — ABNORMAL HIGH (ref 70–99)
Potassium: 4.1 mEq/L (ref 3.5–5.3)
Sodium: 139 mEq/L (ref 135–145)

## 2012-01-13 MED ORDER — LEVOFLOXACIN 500 MG PO TABS: 500.0000 mg | ORAL_TABLET | Freq: Every day | ORAL | Status: AC

## 2012-01-13 MED ORDER — TRAMADOL HCL 50 MG PO TABS: 50.0000 mg | ORAL_TABLET | Freq: Three times a day (TID) | ORAL | Status: AC | PRN

## 2012-01-13 NOTE — Telephone Encounter (Signed)
Lab orders entered for November 2013. 

## 2012-01-13 NOTE — Patient Instructions (Signed)
Please schedule fasting labs prior to next visit Cbc, chem7, lipid, lft, ua with reflex, psa, tsh v70.0

## 2012-01-13 NOTE — Assessment & Plan Note (Signed)
Normotensive and stable. Continue current regimen. Monitor bp as outpt and followup in clinic as scheduled.  

## 2012-01-13 NOTE — Progress Notes (Signed)
  Subjective:    Patient ID: [name], male    DOB: February 16, 1946, 66 y.o.   MRN: [id number]  HPI Pt presents to clinic for followup of multiple medical problems. Wt stable and bp reviewed normotensive. Has chronic oa pain primarily located hands, shoulders, knees with pain and stiffness. Recently worse since is working on building a deck. Using mobic qd with GI pain/burning. Pain inadequately controlled with nsaid. Treated last month for copd exacerbation with abx and prednisone. Notes improvement but still has cough productive for yellow/green sputum. No f/c.  Past Medical History  Diagnosis Date  . Allergic rhinitis   . COPD (chronic obstructive pulmonary disease)     FeV1 64%-2007  . GERD (gastroesophageal reflux disease)   . History of lung abscess     bronchiectasis with RMLandRLL ersection -1996- Dr Edwyna Shell  . Hypertension   . Osteoarthritis     hands and knees  . Emphysema   . Impaired vision     glasses   Past Surgical History  Procedure Date  . Lung surgery     RML andRUL removed due to bleeding and bronchiectasis and lung abcess  . Neck surgery 05/2008  . Lower back surgery 09/2008    06/2009  . Hernia repair 10/2009  . Left knee   . Right foot surgery     reports that he quit smoking about 11 years ago. His smoking use included Cigarettes. He has a 57 pack-year smoking history. He has never used smokeless tobacco. He reports that he drinks about 1.2 - 1.8 ounces of alcohol per week. His drug history not on file. family history includes Colon cancer in his paternal grandmother; Coronary artery disease in an unspecified family member; Heart failure in his mother; Prostate cancer in his father; and Stroke in an unspecified family member. No Known Allergies    Review of Systems see hpi     Objective:   Physical Exam  Nursing note and vitals reviewed. Constitutional: He appears well-developed and well-nourished. No distress.  HENT:  Head: Normocephalic and  atraumatic.  Right Ear: External ear normal.  Left Ear: External ear normal.  Eyes: Conjunctivae are normal. No scleral icterus.  Neck: Neck supple.  Cardiovascular: Normal rate, regular rhythm and normal heart sounds.  Exam reveals no gallop and no friction rub.   No murmur heard. Pulmonary/Chest: Effort normal and breath sounds normal. No respiratory distress. He has no wheezes. He has no rales.  Neurological: He is alert.  Skin: Skin is warm and dry. He is not diaphoretic.  Psychiatric: He has a normal mood and affect.          Assessment & Plan:

## 2012-01-13 NOTE — Assessment & Plan Note (Signed)
Mild flare with colored sputum. No dyspnea. Attempt course of levaquin. Followup if no improvement or worsening.

## 2012-01-13 NOTE — Assessment & Plan Note (Signed)
suboptimal control with mobic. Add ultram prn breakthrough pain for sparing use. Obtain chem7 due to nsaid use.

## 2012-02-19 ENCOUNTER — Telehealth: Payer: Self-pay | Admitting: Internal Medicine

## 2012-02-19 NOTE — Telephone Encounter (Signed)
Refill- norvasc 5mg  tab. Take one tablet by mouth every day. Qty 90 last fill 3.29.13

## 2012-02-20 MED ORDER — AMLODIPINE BESYLATE 5 MG PO TABS: 5.0000 mg | ORAL_TABLET | Freq: Every day | ORAL | Status: DC

## 2012-02-20 NOTE — Telephone Encounter (Signed)
Rx refill sent to pharmacy. 

## 2012-06-12 ENCOUNTER — Other Ambulatory Visit: Payer: Self-pay | Admitting: Critical Care Medicine

## 2012-07-06 LAB — CBC
HCT: 43.2 % (ref 39.0–52.0)
Hemoglobin: 14.7 g/dL (ref 13.0–17.0)
MCH: 30.8 pg (ref 26.0–34.0)
MCHC: 34 g/dL (ref 30.0–36.0)
MCV: 90.4 fL (ref 78.0–100.0)
Platelets: 293 10*3/uL (ref 150–400)
RBC: 4.78 MIL/uL (ref 4.22–5.81)
RDW: 13.2 % (ref 11.5–15.5)
WBC: 5.6 10*3/uL (ref 4.0–10.5)

## 2012-07-06 LAB — PSA: PSA: 1.47 ng/mL (ref <=4.00)

## 2012-07-06 LAB — HEPATIC FUNCTION PANEL
ALT: 15 U/L (ref 0–53)
AST: 20 U/L (ref 0–37)
Albumin: 4.2 g/dL (ref 3.5–5.2)
Alkaline Phosphatase: 67 U/L (ref 39–117)
Bilirubin, Direct: 0.2 mg/dL (ref 0.0–0.3)
Indirect Bilirubin: 0.9 mg/dL (ref 0.0–0.9)
Total Bilirubin: 1.1 mg/dL (ref 0.3–1.2)
Total Protein: 6.9 g/dL (ref 6.0–8.3)

## 2012-07-06 LAB — BASIC METABOLIC PANEL
BUN: 20 mg/dL (ref 6–23)
CO2: 28 mEq/L (ref 19–32)
Calcium: 10 mg/dL (ref 8.4–10.5)
Chloride: 104 mEq/L (ref 96–112)
Creat: 0.82 mg/dL (ref 0.50–1.35)
Glucose, Bld: 90 mg/dL (ref 70–99)
Potassium: 4.7 mEq/L (ref 3.5–5.3)
Sodium: 140 mEq/L (ref 135–145)

## 2012-07-06 LAB — LIPID PANEL
Cholesterol: 163 mg/dL (ref 0–200)
HDL: 52 mg/dL (ref >39)
LDL Cholesterol: 97 mg/dL (ref 0–99)
Total CHOL/HDL Ratio: 3.1 Ratio
Triglycerides: 71 mg/dL (ref <150)
VLDL: 14 mg/dL (ref 0–40)

## 2012-07-06 NOTE — Telephone Encounter (Signed)
Lab orders released/SLS 

## 2012-07-06 NOTE — Addendum Note (Signed)
Addended by: Regis Bill on: 07/06/2012 09:29 AM   Modules accepted: Orders

## 2012-07-07 ENCOUNTER — Other Ambulatory Visit: Payer: Self-pay | Admitting: *Deleted

## 2012-07-07 LAB — URINALYSIS, ROUTINE W REFLEX MICROSCOPIC
Bilirubin Urine: NEGATIVE
Glucose, UA: NEGATIVE mg/dL
Hgb urine dipstick: NEGATIVE
Ketones, ur: NEGATIVE mg/dL
Leukocytes, UA: NEGATIVE
Nitrite: NEGATIVE
Protein, ur: NEGATIVE mg/dL
Specific Gravity, Urine: 1.01 (ref 1.005–1.030)
Urobilinogen, UA: 0.2 mg/dL (ref 0.0–1.0)
pH: 7 (ref 5.0–8.0)

## 2012-07-07 NOTE — Telephone Encounter (Signed)
#  90  rf 1 

## 2012-07-07 NOTE — Telephone Encounter (Signed)
Pt requesting Rx refill for Meloxicam to [facility] 04.23.13 #[id number]]/SLS Please advise.

## 2012-07-08 MED ORDER — MELOXICAM 15 MG PO TABS: 15.0000 mg | ORAL_TABLET | Freq: Every day | ORAL | Status: DC | PRN

## 2012-07-08 NOTE — Telephone Encounter (Signed)
Rx to pharmacy/SLS 

## 2012-07-14 ENCOUNTER — Encounter: Payer: Self-pay | Admitting: Internal Medicine

## 2012-07-14 ENCOUNTER — Ambulatory Visit (INDEPENDENT_AMBULATORY_CARE_PROVIDER_SITE_OTHER): Payer: Medicare Other | Admitting: Internal Medicine

## 2012-07-14 VITALS — BP 130/68 | HR 86 | Temp 98.0°F | Resp 16 | Ht 67.0 in | Wt 154.2 lb

## 2012-07-14 DIAGNOSIS — Z0000 Encounter for general adult medical examination without abnormal findings: Secondary | ICD-10-CM

## 2012-07-14 NOTE — Patient Instructions (Signed)
Please schedule labs prior to your next visit [id number]

## 2012-07-18 NOTE — Progress Notes (Signed)
  Subjective:    Patient ID: [name], male    DOB: 08/17/1946, 66 y.o.   MRN: [id number]  HPI patient presents to clinic for annual exam. Weight stable. Desiree received influenza vaccine for the season. Reviewed CPE labs with patient.   Past Medical History  Diagnosis Date  . Allergic rhinitis   . COPD (chronic obstructive pulmonary disease)     FeV1 64%-2007  . GERD (gastroesophageal reflux disease)   . History of lung abscess     bronchiectasis with RMLandRLL ersection -1996- Dr Edwyna Shell  . Hypertension   . Osteoarthritis     hands and knees  . Emphysema   . Impaired vision     glasses   Past Surgical History  Procedure Date  . Lung surgery     RML andRUL removed due to bleeding and bronchiectasis and lung abcess  . Neck surgery 05/2008  . Lower back surgery 09/2008    06/2009  . Hernia repair 10/2009  . Left knee   . Right foot surgery     reports that he quit smoking about 11 years ago. His smoking use included Cigarettes. He has a 57 pack-year smoking history. He has never used smokeless tobacco. He reports that he drinks about 1.2 - 1.8 ounces of alcohol per week. His drug history not on file. family history includes Colon cancer in his paternal grandmother; Coronary artery disease in an unspecified family member; Heart failure in his mother; Prostate cancer in his father; and Stroke in an unspecified family member. No Known Allergies   Review of Systems see history of present illness      Objective:   Physical Exam  Physical Exam  Nursing note and vitals reviewed. Constitutional: He appears well-developed and well-nourished. No distress.  HENT:  Head: Normocephalic and atraumatic.  Right Ear: Tympanic membrane and external ear normal.  Left Ear: Tympanic membrane and external ear normal.  Nose: Nose normal.  Mouth/Throat: Uvula is midline, oropharynx is clear and moist and mucous membranes are normal. No oropharyngeal exudate.  Eyes: Conjunctivae and EOM  are normal. Pupils are equal, round, and reactive to light. Right eye exhibits no discharge. Left eye exhibits no discharge. No scleral icterus.  Neck: Neck supple. Carotid bruit is not present. No thyromegaly present.  Cardiovascular: Normal rate, regular rhythm and normal heart sounds.  Exam reveals no gallop and no friction rub.   No murmur heard. Pulmonary/Chest: Effort normal and breath sounds normal. No respiratory distress. He has no wheezes. He has no rales.  Abdominal: Soft. He exhibits no distension and no mass. There is no hepatosplenomegaly. There is no tenderness. There is no rebound. Hernia confirmed negative in the right inguinal area and confirmed negative in the left inguinal area.  Lymphadenopathy:    He has no cervical adenopathy.  Neurological: He is alert.  Skin: Skin is warm and dry. He is not diaphoretic.  Psychiatric: He has a normal mood and affect.        Assessment & Plan:

## 2012-07-18 NOTE — Assessment & Plan Note (Signed)
Normal. Given copy of labs

## 2012-08-10 ENCOUNTER — Other Ambulatory Visit: Payer: Self-pay | Admitting: Internal Medicine

## 2012-08-10 MED ORDER — AMLODIPINE BESYLATE 5 MG PO TABS: 5.0000 mg | ORAL_TABLET | Freq: Every day | ORAL | Status: DC

## 2012-08-10 NOTE — Telephone Encounter (Signed)
Refill- amlodipine 5mg  tab. Take one tablet by mouth every day. Qty 90 last fill 9.15.13

## 2012-09-14 ENCOUNTER — Telehealth: Payer: Self-pay | Admitting: Internal Medicine

## 2012-09-14 MED ORDER — LOSARTAN POTASSIUM 100 MG PO TABS: 50.0000 mg | ORAL_TABLET | Freq: Every day | ORAL | Status: DC

## 2012-09-14 NOTE — Telephone Encounter (Signed)
Refill-cozaar 100mg  tab. Take one-half tablet by mouth every day. Qty 90 last fill 10.11.13

## 2012-09-14 NOTE — Telephone Encounter (Signed)
Rx to pharmacy/SLS 

## 2012-10-17 ENCOUNTER — Other Ambulatory Visit: Payer: Self-pay

## 2012-10-30 ENCOUNTER — Ambulatory Visit (INDEPENDENT_AMBULATORY_CARE_PROVIDER_SITE_OTHER)
Admission: RE | Admit: 2012-10-30 | Discharge: 2012-10-30 | Disposition: A | Discharge status: Home / Self Care | Payer: Medicare Other | Source: Ambulatory Visit | Attending: Adult Health | Admitting: Adult Health

## 2012-10-30 ENCOUNTER — Encounter: Payer: Self-pay | Admitting: Adult Health

## 2012-10-30 ENCOUNTER — Ambulatory Visit (INDEPENDENT_AMBULATORY_CARE_PROVIDER_SITE_OTHER): Payer: Medicare Other | Admitting: Adult Health

## 2012-10-30 VITALS — BP 132/78 | HR 80 | Temp 98.5°F | Ht 68.0 in | Wt 155.4 lb

## 2012-10-30 DIAGNOSIS — J449 Chronic obstructive pulmonary disease, unspecified: Secondary | ICD-10-CM

## 2012-10-30 MED ORDER — PREDNISONE 10 MG PO TABS: ORAL_TABLET | ORAL | Status: DC

## 2012-10-30 MED ORDER — LEVALBUTEROL HCL 0.63 MG/3ML IN NEBU
0.6300 mg | INHALATION_SOLUTION | Freq: Once | RESPIRATORY_TRACT | Status: AC
  Administered 2012-10-30: 0.63 mg via RESPIRATORY_TRACT

## 2012-10-30 MED ORDER — HYDROCODONE-HOMATROPINE 5-1.5 MG/5ML PO SYRP: 5.0000 mL | ORAL_SOLUTION | Freq: Four times a day (QID) | ORAL | Status: DC | PRN

## 2012-10-30 MED ORDER — AMOXICILLIN-POT CLAVULANATE 875-125 MG PO TABS: 1.0000 | ORAL_TABLET | Freq: Two times a day (BID) | ORAL | Status: AC

## 2012-10-30 NOTE — Patient Instructions (Addendum)
Augmentin 875 mg one twice daily, take with food. Mucinex DM twice daily as needed. For cough, congestion. Prednisone taper. Over the next week. Tylenol as needed. Increase Pulmicort inhaler 2 puffs twice daily for 2 weeks then back to 2 puffs daily I will call her chest x-ray results. Hydromet 1/2-1 teaspoon every 4-6 hours as needed. For cough, may make you sleepy Follow Dr. Delford Field in 3 months and as needed. Please contact office for sooner follow up if symptoms do not improve or worsen or seek emergency care

## 2012-10-30 NOTE — Assessment & Plan Note (Signed)
Flare  Check xray today   Plan  Augmentin 875 mg one twice daily, take with food. Mucinex DM twice daily as needed. For cough, congestion. Prednisone taper. Over the next week. Tylenol as needed. Increase Pulmicort inhaler 2 puffs twice daily for 2 weeks then back to 2 puffs daily I will call her chest x-ray results. Hydromet 1/2-1 teaspoon every 4-6 hours as needed. For cough, may make you sleepy Follow Dr. Delford Field in 3 months and as needed. Please contact office for sooner follow up if symptoms do not improve or worsen or seek emergency care

## 2012-10-30 NOTE — Addendum Note (Signed)
Addended by: Boone Master E on: 10/30/2012 03:00 PM   Modules accepted: Orders

## 2012-10-30 NOTE — Progress Notes (Signed)
Subjective:    Patient ID: [name], male    DOB: 28-Sep-1945, 67 y.o.   MRN: [id number]  HPI  06/25/11- 89 year old white male former smoker who has been followed by Dr. Delford Field for COPD/asthmatic bronchitis, history of lung abscess. History of partial pneumonectomy 1996 for recurrent bronchitis/abscess. Last here/Dr. Delford Field 10/18/2010. Seen today as a work in with acute complaint-for 4 days has had sore throat, yellow nasal discharge now moving into his chest, low-grade fever, mild headache. He suggests a prednisone taper. At baseline he denies daily phlegm or scant clear sputum only. Last chest x-ray was 2010. Says he had moved to a house located where he was exposed to a lot of traffic exhaust. He has now moved again, out to a role area with clean air, as of May 2012.  12/05/11  Since spring started, sinuses inflammed, burned and draining.  Green mucus from sinuses.  Chest feels congested.  Notes more prod cough yellow mucus.  Notes sinus pressure.  No fever, but chills noted.  Difficulty with back exercises.  Using SABA more  10/30/2012 Acute OV  Complains of PW pt-- pt slight prod cough w clear to yellow mucus, chest tightness w cough and at rest, wheezing, sob x3 wks-- denies any f/c/s -- took Nyquil and dbl mucinex but once went down to reg dose mucinex sx worsened  Has been doing well up until  3 weeks ago, very active w/ horses , kayaking , walking.   No hemoptysis , chest pain or edema.     Review of Systems  Constitutional:   No  weight loss, night sweats,  Fevers, chills, fatigue, lassitude. HEENT:   No headaches,  Difficulty swallowing,  Tooth/dental problems, NO Sore throat,                Notes  sneezing, itching, ear ache,notes   +nasal congestion,notes post nasal drip,   CV:  No chest pain,  Orthopnea, PND, swelling in lower extremities, anasarca, dizziness, palpitations  GI  No heartburn, indigestion, abdominal pain, nausea, vomiting, diarrhea, change in bowel  habits, loss of appetite  .  No chest wall deformity  Skin: no rash or lesions.  GU: no dysuria, change in color of urine, no urgency or frequency.  No flank pain.  MS:  No joint pain or swelling.  No decreased range of motion.  No back pain.  Psych:  No change in mood or affect. No depression or anxiety.  No memory loss.     Objective:   Physical Exam   Filed Vitals:   10/30/12 1414  BP: 132/78  Pulse: 80  Temp: 98.5 F (36.9 C)  TempSrc: Oral  Height: 5\' 8"  (1.727 m)  Weight: 155 lb 6.4 oz (70.489 kg)  SpO2: 96%    Gen: Pleasant, well-nourished, in no distress,  normal affect  ENT: No lesions,  mouth clear,  oropharynx clear, +++postnasal drip, nasal purulence  Neck: No JVD, no TMG, no carotid bruits  Lungs: No use of accessory muscles, no dullness to percussion, exp wheeze R>L Cardiovascular: RRR, heart sounds normal, no murmur or gallops, no peripheral edema  Abdomen: soft and NT, no HSM,  BS normal  Musculoskeletal: No deformities, no cyanosis or clubbing  Neuro: alert, non focal  Skin: Warm, no lesions or rashes  No results found.       Assessment & Plan:   No problem-specific assessment & plan notes found for this encounter.   Updated Medication List Outpatient Encounter Prescriptions as  of 10/30/2012  Medication Sig Dispense Refill  . albuterol (PROAIR HFA) 108 (90 BASE) MCG/ACT inhaler Inhale 2 puffs into the lungs every 6 (six) hours as needed.  18 g  5  . amLODipine (NORVASC) 5 MG tablet Take 1 tablet (5 mg total) by mouth daily.  90 tablet  2  . aspirin-acetaminophen-caffeine (EXCEDRIN MIGRAINE) 250-250-65 MG per tablet Take 1 tablet by mouth every 6 (six) hours as needed.      . budesonide (PULMICORT FLEXHALER) 180 MCG/ACT inhaler Inhale 2 puffs into the lungs daily.  1 each  3  . calcium carbonate (TUMS EX) 750 MG chewable tablet Chew 1 tablet by mouth 2 (two) times daily.        . diclofenac sodium (VOLTAREN) 1 % GEL Apply 1 application  topically 4 (four) times daily as needed.      Marland Kitchen glucosamine-chondroitin 500-400 MG tablet Take 1 tablet by mouth 2 (two) times daily.        Marland Kitchen guaiFENesin (MUCINEX) 600 MG 12 hr tablet Take 600 mg by mouth 2 (two) times daily.        Marland Kitchen losartan (COZAAR) 100 MG tablet Take 0.5 tablets (50 mg total) by mouth daily.  90 tablet  1  . meloxicam (MOBIC) 15 MG tablet Take 1 tablet (15 mg total) by mouth daily as needed.  90 tablet  1  . Multiple Vitamin (MULTIVITAMIN) tablet Take 1 tablet by mouth 3 (three) times a week.        . triamcinolone (NASACORT) 55 MCG/ACT nasal inhaler Place 2 sprays into the nose daily. (2 sprays daily each nostril)  1 Inhaler  3   No facility-administered encounter medications on file as of 10/30/2012.

## 2012-11-02 NOTE — Progress Notes (Signed)
Quick Note:  Called spoke with patient, advised of cxr results / recs as stated by TP. Pt verbalized his understanding and denied any questions. ______ 

## 2012-11-11 ENCOUNTER — Telehealth: Payer: Self-pay | Admitting: Critical Care Medicine

## 2012-11-11 NOTE — Telephone Encounter (Signed)
Needs OV tomorrow in [place]>>>overbook

## 2012-11-11 NOTE — Telephone Encounter (Signed)
Spoke with patient, patient states he still having chest congestion even after completing recs per TP at office visit. Patient states symptoms improved but just has not completely gone away, would like to know how to proceed from here.  10/30/12- OV Note per TP Patient Instructions    Augmentin 875 mg one twice daily, take with food.  Mucinex DM twice daily as needed. For cough, congestion.  Prednisone taper. Over the next week.  Tylenol as needed.  Increase Pulmicort inhaler 2 puffs twice daily for 2 weeks then back to 2 puffs daily  I will call her chest x-ray results.  Hydromet 1/2-1 teaspoon every 4-6 hours as needed. For cough, may make you sleepy  Follow Dr. Delford Field in 3 months and as needed.  Please contact office for sooner follow up if symptoms do not improve or worsen or seek emergency care     Ph[facility]acy: [facility] in [facility]  No Known Allergies

## 2012-11-11 NOTE — Telephone Encounter (Signed)
I spoke with pt and is scheduled to see PW in [place] at 11:00. Nothing further was needed

## 2012-11-12 ENCOUNTER — Encounter: Payer: Self-pay | Admitting: Critical Care Medicine

## 2012-11-12 ENCOUNTER — Ambulatory Visit (INDEPENDENT_AMBULATORY_CARE_PROVIDER_SITE_OTHER): Payer: Medicare Other | Admitting: Critical Care Medicine

## 2012-11-12 VITALS — BP 128/80 | HR 78 | Temp 97.9°F | Ht 68.0 in | Wt 155.0 lb

## 2012-11-12 DIAGNOSIS — J441 Chronic obstructive pulmonary disease with (acute) exacerbation: Secondary | ICD-10-CM

## 2012-11-12 DIAGNOSIS — J449 Chronic obstructive pulmonary disease, unspecified: Secondary | ICD-10-CM

## 2012-11-12 MED ORDER — PREDNISONE 10 MG PO TABS: ORAL_TABLET | ORAL | Status: DC

## 2012-11-12 MED ORDER — MOMETASONE FURO-FORMOTEROL FUM 200-5 MCG/ACT IN AERO: 2.0000 | INHALATION_SPRAY | Freq: Two times a day (BID) | RESPIRATORY_TRACT | Status: DC

## 2012-11-12 MED ORDER — MOXIFLOXACIN HCL 400 MG PO TABS: 400.0000 mg | ORAL_TABLET | Freq: Every day | ORAL | Status: DC

## 2012-11-12 NOTE — Patient Instructions (Addendum)
Avelox one daily for 5 days Prednisone 10mg  Take 4 for three days 3 for three days 2 for three days 1 for three days and stop Dulera 200 two puff twice daily No other medication changes Return 1 month

## 2012-11-12 NOTE — Progress Notes (Signed)
Subjective:    Patient ID: [name], male    DOB: [birth year], 67 y.o.   MRN: [id number]  HPI  06/25/11- 29 year old white male former smoker who has been followed by Dr. Delford Field for COPD/asthmatic bronchitis, history of lung abscess. History of partial pneumonectomy 1996 for recurrent bronchitis/abscess.    11/12/2012 Started getting ill end of Feb/2014, more cough and congestion.  Thick and yellow mucus.  Pt got some better, tightness moved up to center but not well Rx was augmentin/pred x one week  And pulmicort two puff twice daily    Review of Systems  Constitutional:   No  weight loss, night sweats,  Fevers, chills, fatigue, lassitude. HEENT:   No headaches,  Difficulty swallowing,  Tooth/dental problems, NO Sore throat,                Notes  sneezing, itching, ear ache,notes   +nasal congestion,notes post nasal drip,   CV:  No chest pain,  Orthopnea, PND, swelling in lower extremities, anasarca, dizziness, palpitations  GI  No heartburn, indigestion, abdominal pain, nausea, vomiting, diarrhea, change in bowel habits, loss of appetite  .  No chest wall deformity  Skin: no rash or lesions.  GU: no dysuria, change in color of urine, no urgency or frequency.  No flank pain.  MS:  No joint pain or swelling.  No decreased range of motion.  No back pain.  Psych:  No change in mood or affect. No depression or anxiety.  No memory loss.     Objective:   Physical Exam   Filed Vitals:   11/12/12 1055  BP: 128/80  Pulse: 78  Temp: 97.9 F (36.6 C)  TempSrc: Oral  Height: 5\' 8"  (1.727 m)  Weight: 155 lb (70.308 kg)  SpO2: 98%    Gen: Pleasant, well-nourished, in no distress,  normal affect  ENT: No lesions,  mouth clear,  oropharynx clear, +++postnasal drip,  Neck: No JVD, no TMG, no carotid bruits  Lungs: No use of accessory muscles, no dullness to percussion, exp wheeze R>L   Cardiovascular: RRR, heart sounds normal, no murmur or gallops, no peripheral  edema  Abdomen: soft and NT, no HSM,  BS normal  Musculoskeletal: No deformities, no cyanosis or clubbing  Neuro: alert, non focal  Skin: Warm, no lesions or rashes  No results found.       Assessment & Plan:   COPD gold stage C. Gold stage C. COPD with asthmatic bronchitic component now with acute flare Note progression in airway obstruction Plan Avelox one daily for 5 days Prednisone 10mg  Take 4 for three days 3 for three days 2 for three days 1 for three days and stop Dulera 200 two puff twice daily No other medication changes Return 1 month      Updated Medication List Outpatient Encounter Prescriptions as of 11/12/2012  Medication Sig Dispense Refill  . albuterol (PROAIR HFA) 108 (90 BASE) MCG/ACT inhaler Inhale 2 puffs into the lungs every 6 (six) hours as needed.  18 g  5  . amLODipine (NORVASC) 5 MG tablet Take 1 tablet (5 mg total) by mouth daily.  90 tablet  2  . aspirin-acetaminophen-caffeine (EXCEDRIN MIGRAINE) 250-250-65 MG per tablet Take 1 tablet by mouth every 6 (six) hours as needed.      . calcium carbonate (TUMS EX) 750 MG chewable tablet Chew 1 tablet by mouth 2 (two) times daily.        . diclofenac sodium (VOLTAREN) 1 % GEL  Apply 1 application topically 4 (four) times daily as needed.      Marland Kitchen glucosamine-chondroitin 500-400 MG tablet Take 1 tablet by mouth 2 (two) times daily.        Marland Kitchen guaiFENesin (MUCINEX) 600 MG 12 hr tablet Take 600 mg by mouth 2 (two) times daily.        Marland Kitchen losartan (COZAAR) 100 MG tablet Take 0.5 tablets (50 mg total) by mouth daily.  90 tablet  1  . meloxicam (MOBIC) 15 MG tablet Take 1 tablet (15 mg total) by mouth daily as needed.  90 tablet  1  . Multiple Vitamin (MULTIVITAMIN) tablet Take 1 tablet by mouth 3 (three) times a week.        . triamcinolone (NASACORT) 55 MCG/ACT nasal inhaler Place 2 sprays into the nose daily. (2 sprays daily each nostril)  1 Inhaler  3  . [DISCONTINUED] budesonide (PULMICORT FLEXHALER) 180 MCG/ACT  inhaler Inhale 2 puffs into the lungs daily.  1 each  3  . [DISCONTINUED] budesonide (PULMICORT) 180 MCG/ACT inhaler Inhale 2 puffs into the lungs 2 (two) times daily.      . mometasone-formoterol (DULERA) 200-5 MCG/ACT AERO Inhale 2 puffs into the lungs 2 (two) times daily.  1 Inhaler  11  . mometasone-formoterol (DULERA) 200-5 MCG/ACT AERO Inhale 2 puffs into the lungs 2 (two) times daily.  13 g  5  . moxifloxacin (AVELOX) 400 MG tablet Take 1 tablet (400 mg total) by mouth daily.  5 tablet  0  . predniSONE (DELTASONE) 10 MG tablet Take 4 for three days 3 for three days 2 for three days 1 for three days and stop  30 tablet  0  . [DISCONTINUED] HYDROcodone-homatropine (HYDROMET) 5-1.5 MG/5ML syrup Take 5 mLs by mouth every 6 (six) hours as needed for cough.  240 mL  0  . [DISCONTINUED] predniSONE (DELTASONE) 10 MG tablet 4 tabs for 2 days, then 3 tabs for 2 days, 2 tabs for 2 days, then 1 tab for 2 days, then stop  20 tablet  0   No facility-administered encounter medications on file as of 11/12/2012.

## 2012-11-13 NOTE — Assessment & Plan Note (Addendum)
Gold stage C. COPD with asthmatic bronchitic component now with acute flare Note progression in airway obstruction Plan Avelox one daily for 5 days Prednisone 10mg  Take 4 for three days 3 for three days 2 for three days 1 for three days and stop Dulera 200 two puff twice daily No other medication changes Return 1 month

## 2012-12-01 ENCOUNTER — Telehealth: Payer: Self-pay | Admitting: Critical Care Medicine

## 2012-12-01 NOTE — Telephone Encounter (Signed)
Pt reports that 2 days after changing from Pulmicort to [facility] pt had and increase in sinus drainage, sorethroat and sores in mouth making it hard to wear dentures.  Pt reports that he was rinsing his mouth after each use.  Pt would like Dr Lynelle Doctor recommendations regarding changing back to Pulmicort.  Please advise

## 2012-12-01 NOTE — Telephone Encounter (Signed)
Get pt a spacer for dulera, if this continues will need to stop dulera and go back to pulmicort at 4 puff bid

## 2012-12-01 NOTE — Telephone Encounter (Signed)
Patient aware of recs of listed below per Dr. Delford Field. Spacer has been placed upfront, patient aware that we can instruct him on how to use once he arrives to pick it up. Verbalized understanding and nothing further needed at this time.

## 2012-12-02 ENCOUNTER — Telehealth: Payer: Self-pay | Admitting: Critical Care Medicine

## 2012-12-02 MED ORDER — MOMETASONE FURO-FORMOTEROL FUM 200-5 MCG/ACT IN AERO: 2.0000 | INHALATION_SPRAY | Freq: Two times a day (BID) | RESPIRATORY_TRACT | Status: DC

## 2012-12-02 NOTE — Telephone Encounter (Signed)
LMOM that sample is at front desk for pick up.

## 2012-12-09 ENCOUNTER — Ambulatory Visit (INDEPENDENT_AMBULATORY_CARE_PROVIDER_SITE_OTHER): Payer: Medicare Other | Admitting: Family

## 2012-12-09 ENCOUNTER — Encounter: Payer: Self-pay | Admitting: Family

## 2012-12-09 VITALS — BP 126/78 | HR 88 | Temp 97.8°F | Resp 16 | Wt 153.1 lb

## 2012-12-09 DIAGNOSIS — J329 Chronic sinusitis, unspecified: Secondary | ICD-10-CM

## 2012-12-09 MED ORDER — AMOXICILLIN-POT CLAVULANATE 875-125 MG PO TABS: 1.0000 | ORAL_TABLET | Freq: Two times a day (BID) | ORAL | Status: DC

## 2012-12-09 NOTE — Patient Instructions (Addendum)
Please call if symptoms worsen or if not improved in 1 week. Follow up as scheduled.

## 2012-12-09 NOTE — Progress Notes (Signed)
Subjective:    Patient ID: [name], male    DOB: 02-Feb-1946, 67 y.o.   MRN: [id number]  HPI  [name] is a 67 yr old male who presents today with chief complaint of nasal congestion. Nasal congestion has ranged in color from clear to yellow to brown.  He reports symptoms started about 1 month ago and are associated with post nasal drip and intermittent sore throat.  Using excedrin migraine with minimal improvement in symptoms. On Saturday he developed associated chills, sweating and fatigue.Had weakness, anorexia, nausea.  Today is is feeling better.  Drank some ensure and appetite is returning.  Reports that he had bronchitis in February and was treated with prednisone/abx x 2.  He was changed to dulera from [employer]. He reports breathing is better with dulera.  Mild chest congestion continues but much better.     Review of Systems    see HPI  Past Medical History  Diagnosis Date  . Allergic rhinitis   . COPD (chronic obstructive pulmonary disease)     FeV1 64%-2007  . GERD (gastroesophageal reflux disease)   . History of lung abscess     bronchiectasis with RMLandRLL ersection -1996- Dr Edwyna Shell  . Hypertension   . Osteoarthritis     hands and knees  . Emphysema   . Impaired vision     glasses    History   Social History  . Marital Status: Married    Spouse Name: N/A    Number of Children: N/A  . Years of Education: N/A   Occupational History  . retired Librarian, academic    Social History Main Topics  . Smoking status: Former Smoker -- 1.50 packs/day for 38 years    Types: Cigarettes    Quit date: 09/03/2000  . Smokeless tobacco: Never Used  . Alcohol Use: 1.2 - 1.8 oz/week    2-3 Cans of beer per week  . Drug Use: Not on file  . Sexually Active: Not on file   Other Topics Concern  . Not on file   Social History Narrative   Retired Librarian, academic   Patient states former smoker. 1 1/2 ppd x 38 yrs  Quit in Jan 2002   Married - 2 weeks (4th  marriage)   divorced,  remarried 84-2004 (lost wife to lung ca),  remarried (divorced),    1 son  - 74 [name])   Alcohol use-yes (2-3 beers per week)            Past Surgical History  Procedure Laterality Date  . Lung surgery      RML andRUL removed due to bleeding and bronchiectasis and lung abcess  . Neck surgery  05/2008  . Lower back surgery  09/2008    06/2009  . Hernia repair  10/2009  . Left knee    . Right foot surgery      Family History  Problem Relation Age of Onset  . Prostate cancer Father     father died prostate ca  . Coronary artery disease      1st degree relative<60  . Stroke      1st degree relative<50  . Heart failure Mother     age 48  . Colon cancer Paternal Grandmother     No Known Allergies  Current Outpatient Prescriptions on File Prior to Visit  Medication Sig Dispense Refill  . albuterol (PROAIR HFA) 108 (90 BASE) MCG/ACT inhaler Inhale 2 puffs into the lungs every 6 (six)  hours as needed.  18 g  5  . amLODipine (NORVASC) 5 MG tablet Take 1 tablet (5 mg total) by mouth daily.  90 tablet  2  . aspirin-acetaminophen-caffeine (EXCEDRIN MIGRAINE) 250-250-65 MG per tablet Take 1 tablet by mouth every 6 (six) hours as needed.      . calcium carbonate (TUMS EX) 750 MG chewable tablet Chew 1 tablet by mouth 2 (two) times daily.        . diclofenac sodium (VOLTAREN) 1 % GEL Apply 1 application topically 4 (four) times daily as needed.      Marland Kitchen glucosamine-chondroitin 500-400 MG tablet Take 1 tablet by mouth 2 (two) times daily.        Marland Kitchen guaiFENesin (MUCINEX) 600 MG 12 hr tablet Take 600 mg by mouth 2 (two) times daily.        Marland Kitchen losartan (COZAAR) 100 MG tablet Take 0.5 tablets (50 mg total) by mouth daily.  90 tablet  1  . meloxicam (MOBIC) 15 MG tablet Take 1 tablet (15 mg total) by mouth daily as needed.  90 tablet  1  . mometasone-formoterol (DULERA) 200-5 MCG/ACT AERO Inhale 2 puffs into the lungs 2 (two) times daily.  1 Inhaler  0  . Multiple Vitamin  (MULTIVITAMIN) tablet Take 1 tablet by mouth 3 (three) times a week.        . triamcinolone (NASACORT) 55 MCG/ACT nasal inhaler Place 2 sprays into the nose daily. (2 sprays daily each nostril)  1 Inhaler  3   No current facility-administered medications on file prior to visit.    BP 126/78  Pulse 88  Temp(Src) 97.8 F (36.6 C) (Oral)  Resp 16  Wt 153 lb 1.9 oz (69.455 kg)  BMI 23.29 kg/m2  SpO2 99%    Objective:   Physical Exam  Constitutional: He appears well-developed and well-nourished. No distress.  HENT:  Head: Normocephalic and atraumatic.  Right Ear: Tympanic membrane and ear canal normal.  Left Ear: Tympanic membrane and ear canal normal.  Mouth/Throat: No oropharyngeal exudate, posterior oropharyngeal edema or posterior oropharyngeal erythema.  Cardiovascular: Normal rate and regular rhythm.   No murmur heard. Pulmonary/Chest: Effort normal and breath sounds normal. No respiratory distress. He has no wheezes. He has no rales. He exhibits no tenderness.          Assessment & Plan:

## 2012-12-09 NOTE — Assessment & Plan Note (Signed)
Acute on chronic sinusitis.  Will rx with augmentin, add claritin.

## 2012-12-17 ENCOUNTER — Ambulatory Visit (INDEPENDENT_AMBULATORY_CARE_PROVIDER_SITE_OTHER): Payer: Medicare Other | Admitting: Critical Care Medicine

## 2012-12-17 ENCOUNTER — Encounter: Payer: Self-pay | Admitting: Critical Care Medicine

## 2012-12-17 VITALS — BP 148/66 | HR 92 | Temp 98.0°F | Ht 68.0 in | Wt 155.0 lb

## 2012-12-17 DIAGNOSIS — J449 Chronic obstructive pulmonary disease, unspecified: Secondary | ICD-10-CM

## 2012-12-17 NOTE — Assessment & Plan Note (Addendum)
Gold stage C. COPD stable at this time Plan Maintain inhaled medications as prescribed with Dulera twice daily utilizing AeroChamber

## 2012-12-17 NOTE — Patient Instructions (Addendum)
Stay on Dulera, use spacer, use samples Return 2 months

## 2012-12-17 NOTE — Progress Notes (Signed)
Subjective:    Patient ID: [name], male    DOB: 1946-05-02, 67 y.o.   MRN: [id number]  HPI  12/17/2012 Now is better. Did not tolerate dulera, had to use spacer, sores in mouth Overall the patient is improved at this time. There is less cough. There is less congestion and shortness of breath. The spacer device has helped with mouth sores in mouth and throat irritation.     Review of Systems  Constitutional:   No  weight loss, night sweats,  Fevers, chills, fatigue, lassitude. HEENT:   No headaches,  Difficulty swallowing,  Tooth/dental problems, NO Sore throat,                Notes  sneezing, itching, ear ache,notes   +nasal congestion,notes post nasal drip,   CV:  No chest pain,  Orthopnea, PND, swelling in lower extremities, anasarca, dizziness, palpitations  GI  No heartburn, indigestion, abdominal pain, nausea, vomiting, diarrhea, change in bowel habits, loss of appetite  .  No chest wall deformity  Skin: no rash or lesions.  GU: no dysuria, change in color of urine, no urgency or frequency.  No flank pain.  MS:  No joint pain or swelling.  No decreased range of motion.  No back pain.  Psych:  No change in mood or affect. No depression or anxiety.  No memory loss.     Objective:   Physical Exam   Filed Vitals:   12/17/12 1009  BP: 148/66  Pulse: 92  Temp: 98 F (36.7 C)  TempSrc: Oral  Height: 5\' 8"  (1.727 m)  Weight: 155 lb (70.308 kg)  SpO2: 96%    Gen: Pleasant, well-nourished, in no distress,  normal affect  ENT: No lesions,  mouth clear,  oropharynx clear, +postnasal drip,  Neck: No JVD, no TMG, no carotid bruits  Lungs: No use of accessory muscles, no dullness to percussion, exp wheeze R>L   Cardiovascular: RRR, heart sounds normal, no murmur or gallops, no peripheral edema  Abdomen: soft and NT, no HSM,  BS normal  Musculoskeletal: No deformities, no cyanosis or clubbing  Neuro: alert, non focal  Skin: Warm, no lesions or  rashes  No results found.       Assessment & Plan:   COPD gold stage C. Gold stage C. COPD stable at this time Plan Maintain inhaled medications as prescribed with Elwin Sleight twice daily utilizing AeroChamber    Updated Medication List Outpatient Encounter Prescriptions as of 12/17/2012  Medication Sig Dispense Refill  . albuterol (PROAIR HFA) 108 (90 BASE) MCG/ACT inhaler Inhale 2 puffs into the lungs every 6 (six) hours as needed.  18 g  5  . amLODipine (NORVASC) 5 MG tablet Take 1 tablet (5 mg total) by mouth daily.  90 tablet  2  . amoxicillin-clavulanate (AUGMENTIN) 875-125 MG per tablet Take 1 tablet by mouth 2 (two) times daily.  20 tablet  0  . aspirin-acetaminophen-caffeine (EXCEDRIN MIGRAINE) 250-250-65 MG per tablet Take 1 tablet by mouth every 6 (six) hours as needed.      . calcium carbonate (TUMS EX) 750 MG chewable tablet Chew 1 tablet by mouth 2 (two) times daily.        . diclofenac sodium (VOLTAREN) 1 % GEL Apply 1 application topically 4 (four) times daily as needed.      Marland Kitchen glucosamine-chondroitin 500-400 MG tablet Take 1 tablet by mouth 2 (two) times daily.        Marland Kitchen guaiFENesin (MUCINEX) 600 MG 12  hr tablet Take 600 mg by mouth 2 (two) times daily.        Marland Kitchen losartan (COZAAR) 100 MG tablet Take 0.5 tablets (50 mg total) by mouth daily.  90 tablet  1  . meloxicam (MOBIC) 15 MG tablet Take 1 tablet (15 mg total) by mouth daily as needed.  90 tablet  1  . mometasone-formoterol (DULERA) 200-5 MCG/ACT AERO Inhale 2 puffs into the lungs 2 (two) times daily.  1 Inhaler  0  . Multiple Vitamin (MULTIVITAMIN) tablet Take 1 tablet by mouth 3 (three) times a week.        Marland Kitchen Spacer/Aero-Holding Chambers (AEROCHAMBER PLUS) inhaler by Other route. Use as instructed with dulera      . triamcinolone (NASACORT) 55 MCG/ACT nasal inhaler Place 2 sprays into the nose daily. (2 sprays daily each nostril)  1 Inhaler  3  . [DISCONTINUED] loratadine (CLARITIN) 10 MG tablet Take 10 mg by mouth  daily.       No facility-administered encounter medications on file as of 12/17/2012.

## 2013-01-04 ENCOUNTER — Telehealth: Payer: Self-pay

## 2013-01-04 DIAGNOSIS — Z79899 Other long term (current) drug therapy: Secondary | ICD-10-CM

## 2013-01-04 LAB — BASIC METABOLIC PANEL
BUN: 19 mg/dL (ref 6–23)
CO2: 24 mEq/L (ref 19–32)
Calcium: 9.1 mg/dL (ref 8.4–10.5)
Chloride: 106 mEq/L (ref 96–112)
Creat: 0.74 mg/dL (ref 0.50–1.35)
Glucose, Bld: 90 mg/dL (ref 70–99)
Potassium: 4.2 mEq/L (ref 3.5–5.3)
Sodium: 139 mEq/L (ref 135–145)

## 2013-01-04 NOTE — Telephone Encounter (Signed)
Labs placed.

## 2013-01-08 ENCOUNTER — Telehealth: Payer: Self-pay | Admitting: *Deleted

## 2013-01-08 MED ORDER — MELOXICAM 15 MG PO TABS: 15.0000 mg | ORAL_TABLET | Freq: Every day | ORAL | Status: DC | PRN

## 2013-01-08 NOTE — Telephone Encounter (Signed)
Received fax from [facility] requesting refill of Mobic 15mg , #90 x 1 refill. Pt states the pharmacy was suppposed to have requested medication on Monday. Rx sent to pharmacy. Pt has follow up next week.

## 2013-01-12 ENCOUNTER — Ambulatory Visit (HOSPITAL_BASED_OUTPATIENT_CLINIC_OR_DEPARTMENT_OTHER)
Admission: RE | Admit: 2013-01-12 | Discharge: 2013-01-12 | Disposition: A | Discharge status: Home / Self Care | Payer: Medicare Other | Source: Ambulatory Visit | Attending: Family | Admitting: Family

## 2013-01-12 ENCOUNTER — Encounter: Payer: Self-pay | Admitting: Family

## 2013-01-12 ENCOUNTER — Ambulatory Visit (INDEPENDENT_AMBULATORY_CARE_PROVIDER_SITE_OTHER): Payer: Medicare Other | Admitting: Family

## 2013-01-12 VITALS — BP 136/70 | HR 68 | Temp 98.0°F | Resp 16 | Ht 71.0 in | Wt 155.0 lb

## 2013-01-12 DIAGNOSIS — M5030 Other cervical disc degeneration, unspecified cervical region: Secondary | ICD-10-CM | POA: Insufficient documentation

## 2013-01-12 DIAGNOSIS — M549 Dorsalgia, unspecified: Secondary | ICD-10-CM | POA: Insufficient documentation

## 2013-01-12 DIAGNOSIS — I10 Essential (primary) hypertension: Secondary | ICD-10-CM

## 2013-01-12 DIAGNOSIS — M542 Cervicalgia: Secondary | ICD-10-CM

## 2013-01-12 DIAGNOSIS — J309 Allergic rhinitis, unspecified: Secondary | ICD-10-CM

## 2013-01-12 HISTORY — DX: Dorsalgia, unspecified: M54.9

## 2013-01-12 MED ORDER — METHYLPREDNISOLONE 4 MG PO KIT: PACK | ORAL | Status: DC

## 2013-01-12 MED ORDER — DICLOFENAC SODIUM 1 % TD GEL: 1.0000 "application " | Freq: Four times a day (QID) | TRANSDERMAL | Status: DC | PRN

## 2013-01-12 MED ORDER — TRAMADOL HCL 50 MG PO TABS: 50.0000 mg | ORAL_TABLET | Freq: Three times a day (TID) | ORAL | Status: DC | PRN

## 2013-01-12 NOTE — Assessment & Plan Note (Signed)
I am concerned re: cervical disc disease.  Obtain plain film.  Will also rx with medrol dose pak.

## 2013-01-12 NOTE — Progress Notes (Signed)
Subjective:    Patient ID: [name], male    DOB: Jan 27, 1946, 67 y.o.   MRN: [id number]  HPI  [name] is a 67 yr old male who presents today for follow up.  1) HTN- denies CP or swelling . SOB at baseline  2) Shoulder pain- bilateral R>L.  Starts in neck goes to shoulder down to "my little finger."  He report some aching in the right side of the neck.  He has hx of cervical fusion.      3) Allergic rhinitis- reports that he developed back pain after started claritin.  Went away after 3-4 days after pt stopped taking.    Review of Systems See HPI  Past Medical History  Diagnosis Date  . Allergic rhinitis   . COPD (chronic obstructive pulmonary disease)     FeV1 64%-2007  . GERD (gastroesophageal reflux disease)   . History of lung abscess     bronchiectasis with RMLandRLL ersection -1996- Dr Edwyna Shell  . Hypertension   . Osteoarthritis     hands and knees  . Emphysema   . Impaired vision     glasses    History   Social History  . Marital Status: Married    Spouse Name: N/A    Number of Children: N/A  . Years of Education: N/A   Occupational History  . retired Librarian, academic    Social History Main Topics  . Smoking status: Former Smoker -- 1.50 packs/day for 38 years    Types: Cigarettes    Quit date: 09/03/2000  . Smokeless tobacco: Never Used  . Alcohol Use: 1.2 - 1.8 oz/week    2-3 Cans of beer per week  . Drug Use: Not on file  . Sexually Active: Not on file   Other Topics Concern  . Not on file   Social History Narrative   Retired Librarian, academic   Patient states former smoker. 1 1/2 ppd x 38 yrs  Quit in Jan 2002   Married - 2 weeks (4th marriage)   divorced,  remarried 84-2004 (lost wife to lung ca),  remarried (divorced),    1 son  - 73 [name])   Alcohol use-yes (2-3 beers per week)            Past Surgical History  Procedure Laterality Date  . Lung surgery      RML andRUL removed due to bleeding and bronchiectasis and lung  abcess  . Neck surgery  05/2008  . Lower back surgery  09/2008    06/2009  . Hernia repair  10/2009  . Left knee    . Right foot surgery      Family History  Problem Relation Age of Onset  . Prostate cancer Father     father died prostate ca  . Coronary artery disease      1st degree relative<60  . Stroke      1st degree relative<50  . Heart failure Mother     age 54  . Colon cancer Paternal Grandmother     Allergies  Allergen Reactions  . Claritin (Loratadine)     Back pain    Current Outpatient Prescriptions on File Prior to Visit  Medication Sig Dispense Refill  . albuterol (PROAIR HFA) 108 (90 BASE) MCG/ACT inhaler Inhale 2 puffs into the lungs every 6 (six) hours as needed.  18 g  5  . amLODipine (NORVASC) 5 MG tablet Take 1 tablet (5 mg total) by mouth daily.  90 tablet  2  . aspirin-acetaminophen-caffeine (EXCEDRIN MIGRAINE) 250-250-65 MG per tablet Take 1 tablet by mouth every 6 (six) hours as needed.      . calcium carbonate (TUMS EX) 750 MG chewable tablet Chew 1 tablet by mouth 2 (two) times daily.        Marland Kitchen glucosamine-chondroitin 500-400 MG tablet Take 1 tablet by mouth 2 (two) times daily.        Marland Kitchen guaiFENesin (MUCINEX) 600 MG 12 hr tablet Take 600 mg by mouth 2 (two) times daily.        Marland Kitchen losartan (COZAAR) 100 MG tablet Take 0.5 tablets (50 mg total) by mouth daily.  90 tablet  1  . meloxicam (MOBIC) 15 MG tablet Take 1 tablet (15 mg total) by mouth daily as needed.  90 tablet  1  . mometasone-formoterol (DULERA) 200-5 MCG/ACT AERO Inhale 2 puffs into the lungs 2 (two) times daily.  1 Inhaler  0  . Multiple Vitamin (MULTIVITAMIN) tablet Take 1 tablet by mouth 3 (three) times a week.        . triamcinolone (NASACORT) 55 MCG/ACT nasal inhaler Place 2 sprays into the nose daily. (2 sprays daily each nostril)  1 Inhaler  3  . Spacer/Aero-Holding Chambers (AEROCHAMBER PLUS) inhaler by Other route. Use as instructed with dulera       No current facility-administered  medications on file prior to visit.    BP 136/70  Pulse 68  Temp(Src) 98 F (36.7 C) (Oral)  Resp 16  Ht 5\' 11"  (1.803 m)  Wt 155 lb 0.6 oz (70.326 kg)  BMI 21.63 kg/m2  SpO2 98%       Objective:   Physical Exam  Constitutional: He appears well-developed and well-nourished. No distress.  Cardiovascular: Normal rate and regular rhythm.   No murmur heard. Pulmonary/Chest: Effort normal. No respiratory distress.  Soft left sided expiratory wheeze.    Neurological:  Bilateral hand grasps/UE/LE 5/5 strenght  Psychiatric: He has a normal mood and affect. His behavior is normal. Judgment and thought content normal.          Assessment & Plan:

## 2013-01-12 NOTE — Assessment & Plan Note (Addendum)
Did not tolerate claritin.  Continue nasal steroid.

## 2013-01-12 NOTE — Patient Instructions (Addendum)
Complete your x ray on the first floor. Call if your symptoms worsen, or if symptoms do not improve. Follow up in 3 months.

## 2013-01-12 NOTE — Assessment & Plan Note (Signed)
BP Readings from Last 3 Encounters:  01/12/13 136/70  12/17/12 148/66  12/09/12 126/78   BP looks good on losartan and amlodpine.  BMET stable.

## 2013-01-19 ENCOUNTER — Telehealth: Payer: Self-pay | Admitting: Family Medicine

## 2013-01-19 DIAGNOSIS — M542 Cervicalgia: Secondary | ICD-10-CM

## 2013-01-19 MED ORDER — HYDROCODONE-ACETAMINOPHEN 5-325 MG PO TABS: 1.0000 | ORAL_TABLET | Freq: Four times a day (QID) | ORAL | Status: DC | PRN

## 2013-01-19 NOTE — Telephone Encounter (Signed)
Please evaluate and refer as indicated

## 2013-01-19 NOTE — Telephone Encounter (Signed)
If worse after oral steroids may need visit to orthopaedics for more imaging of shoulders and possible steroid injections, etc

## 2013-01-19 NOTE — Telephone Encounter (Signed)
Will refer to neurosurg.  Rx sent for short course of vicodin- do not drive after taking.

## 2013-01-19 NOTE — Addendum Note (Signed)
Addended by: Sandford Craze on: 01/19/2013 11:17 PM   Modules accepted: Orders

## 2013-01-19 NOTE — Telephone Encounter (Signed)
Patient states that he saw Melissa regarding shoulder pain. Melissa prescribed him prednisone and he finished that yesterday but he states that now the pain is worse than before and wanted to know what he should do?

## 2013-01-19 NOTE — Telephone Encounter (Signed)
Patient stated that he is agreeable to seeing ortho however he was also wanting to know if you could call he in something else for the pain. Please advise?

## 2013-01-19 NOTE — Telephone Encounter (Signed)
Please advise 

## 2013-01-20 NOTE — Telephone Encounter (Signed)
Patient notified of rx. Patient has seen Dr. Dola Argyle. Blanche East at [facility] in [place]. He would like to continue seeing Dr. Blanche East.

## 2013-02-16 ENCOUNTER — Other Ambulatory Visit: Payer: Self-pay | Admitting: Neurosurgery

## 2013-02-16 ENCOUNTER — Telehealth: Payer: Self-pay | Admitting: Critical Care Medicine

## 2013-02-16 DIAGNOSIS — G959 Disease of spinal cord, unspecified: Secondary | ICD-10-CM

## 2013-02-16 MED ORDER — MOMETASONE FURO-FORMOTEROL FUM 200-5 MCG/ACT IN AERO: 2.0000 | INHALATION_SPRAY | Freq: Two times a day (BID) | RESPIRATORY_TRACT | Status: DC

## 2013-02-16 NOTE — Telephone Encounter (Signed)
Called, spoke with pt. He would like [place] to bring dulera 200 sample to [facility] for his visit with PW on this coming Thursday.  Advised I would bring a sample.  He verbalized understanding and voiced no further questions or concerns at this time.

## 2013-02-18 ENCOUNTER — Encounter: Payer: Self-pay | Admitting: Critical Care Medicine

## 2013-02-18 ENCOUNTER — Ambulatory Visit (INDEPENDENT_AMBULATORY_CARE_PROVIDER_SITE_OTHER): Payer: Medicare Other | Admitting: Critical Care Medicine

## 2013-02-18 VITALS — BP 128/64 | HR 74 | Temp 98.6°F | Ht 68.0 in | Wt 154.0 lb

## 2013-02-18 DIAGNOSIS — J449 Chronic obstructive pulmonary disease, unspecified: Secondary | ICD-10-CM

## 2013-02-18 MED ORDER — FLUTICASONE PROPIONATE 50 MCG/ACT NA SUSP: 2.0000 | Freq: Every day | NASAL | Status: DC

## 2013-02-18 NOTE — Patient Instructions (Addendum)
No change in medications. Return in         4 months 

## 2013-02-18 NOTE — Progress Notes (Signed)
Subjective:    Patient ID: [name], male    DOB: 1945/09/19, 67 y.o.   MRN: [id number]  HPI   02/18/2013 Chief Complaint  Patient presents with  . 2 month follow up    Breathing has improved.  Does have DOE, coughing a little with small amount of clear mucus, not much wheezing.  No chest tightness or chest pain.    Doing better. Less issues Pt denies any significant sore throat, nasal congestion or excess secretions, fever, chills, sweats, unintended weight loss, pleurtic or exertional chest pain, orthopnea PND, or leg swelling Pt denies any increase in rescue therapy over baseline, denies waking up needing it or having any early am or nocturnal exacerbations of coughing/wheezing/or dyspnea. Pt also denies any obvious fluctuation in symptoms with  weather or environmental change or other alleviating or aggravating factors       Review of Systems  Constitutional:   No  weight loss, night sweats,  Fevers, chills, fatigue, lassitude. HEENT:   No headaches,  Difficulty swallowing,  Tooth/dental problems, NO Sore throat,                Notes  sneezing, itching, ear ache,notes   +nasal congestion,notes post nasal drip,   CV:  No chest pain,  Orthopnea, PND, swelling in lower extremities, anasarca, dizziness, palpitations  GI  No heartburn, indigestion, abdominal pain, nausea, vomiting, diarrhea, change in bowel habits, loss of appetite  .  No chest wall deformity  Skin: no rash or lesions.  GU: no dysuria, change in color of urine, no urgency or frequency.  No flank pain.  MS:  No joint pain or swelling.  No decreased range of motion.  No back pain.  Psych:  No change in mood or affect. No depression or anxiety.  No memory loss.     Objective:   Physical Exam   Filed Vitals:   02/18/13 1017  BP: 128/64  Pulse: 74  Temp: 98.6 F (37 C)  TempSrc: Oral  Height: 5\' 8"  (1.727 m)  Weight: 154 lb (69.854 kg)  SpO2: 94%    Gen: Pleasant, well-nourished, in no  distress,  normal affect  ENT: No lesions,  mouth clear,  oropharynx clear, +postnasal drip,  Neck: No JVD, no TMG, no carotid bruits  Lungs: No use of accessory muscles, no dullness to percussion, clearer  Cardiovascular: RRR, heart sounds normal, no murmur or gallops, no peripheral edema  Abdomen: soft and NT, no HSM,  BS normal  Musculoskeletal: No deformities, no cyanosis or clubbing  Neuro: alert, non focal  Skin: Warm, no lesions or rashes  No results found.       Assessment & Plan:   COPD gold stage C. Gold C Copd improved with dulera Plan Maintain dulera     Updated Medication List Outpatient Encounter Prescriptions as of 02/18/2013  Medication Sig Dispense Refill  . albuterol (PROAIR HFA) 108 (90 BASE) MCG/ACT inhaler Inhale 2 puffs into the lungs every 6 (six) hours as needed.  18 g  5  . amLODipine (NORVASC) 5 MG tablet Take 1 tablet (5 mg total) by mouth daily.  90 tablet  2  . aspirin-acetaminophen-caffeine (EXCEDRIN MIGRAINE) 250-250-65 MG per tablet Take 1 tablet by mouth every 6 (six) hours as needed.      . calcium carbonate (TUMS EX) 750 MG chewable tablet Chew 1 tablet by mouth 2 (two) times daily.        . diclofenac sodium (VOLTAREN) 1 % GEL Apply  1 application topically 4 (four) times daily as needed.  1 Tube  2  . glucosamine-chondroitin 500-400 MG tablet Take 1 tablet by mouth 2 (two) times daily.        Marland Kitchen guaiFENesin (MUCINEX) 600 MG 12 hr tablet Take 600 mg by mouth 2 (two) times daily.        Marland Kitchen HYDROcodone-acetaminophen (NORCO/VICODIN) 5-325 MG per tablet Take 1 tablet by mouth every 6 (six) hours as needed for pain.  20 tablet  0  . losartan (COZAAR) 100 MG tablet Take 0.5 tablets (50 mg total) by mouth daily.  90 tablet  1  . meloxicam (MOBIC) 15 MG tablet Take 1 tablet (15 mg total) by mouth daily as needed.  90 tablet  1  . mometasone-formoterol (DULERA) 200-5 MCG/ACT AERO Inhale 2 puffs into the lungs 2 (two) times daily.  1 Inhaler  0  .  Multiple Vitamin (MULTIVITAMIN) tablet Take 1 tablet by mouth 3 (three) times a week.        . traMADol (ULTRAM) 50 MG tablet Take 1 tablet (50 mg total) by mouth every 8 (eight) hours as needed for pain.  30 tablet  0  . [DISCONTINUED] triamcinolone (NASACORT) 55 MCG/ACT nasal inhaler Place 2 sprays into the nose daily. (2 sprays daily each nostril)  1 Inhaler  3  . fluticasone (FLONASE) 50 MCG/ACT nasal spray Place 2 sprays into the nose daily.  16 g  12  . [DISCONTINUED] methylPREDNISolone (MEDROL DOSEPAK) 4 MG tablet follow package directions  21 tablet  0   No facility-administered encounter medications on file as of 02/18/2013.

## 2013-02-18 NOTE — Assessment & Plan Note (Signed)
Gold C Copd improved with [employer]

## 2013-02-19 ENCOUNTER — Ambulatory Visit
Admission: RE | Admit: 2013-02-19 | Discharge: 2013-02-19 | Disposition: A | Discharge status: Home / Self Care | Payer: Medicare Other | Source: Ambulatory Visit | Attending: Neurosurgery | Admitting: Neurosurgery

## 2013-02-19 DIAGNOSIS — G959 Disease of spinal cord, unspecified: Secondary | ICD-10-CM

## 2013-04-07 ENCOUNTER — Other Ambulatory Visit: Payer: Self-pay

## 2013-04-19 ENCOUNTER — Ambulatory Visit (INDEPENDENT_AMBULATORY_CARE_PROVIDER_SITE_OTHER): Payer: Medicare Other | Admitting: Family Medicine

## 2013-04-19 ENCOUNTER — Encounter: Payer: Self-pay | Admitting: Family Medicine

## 2013-04-19 VITALS — BP 148/70 | HR 75 | Temp 98.2°F | Ht 68.0 in | Wt 153.1 lb

## 2013-04-19 DIAGNOSIS — Z5189 Encounter for other specified aftercare: Secondary | ICD-10-CM

## 2013-04-19 DIAGNOSIS — T7840XD Allergy, unspecified, subsequent encounter: Secondary | ICD-10-CM

## 2013-04-19 DIAGNOSIS — J209 Acute bronchitis, unspecified: Secondary | ICD-10-CM

## 2013-04-19 DIAGNOSIS — M549 Dorsalgia, unspecified: Secondary | ICD-10-CM

## 2013-04-19 DIAGNOSIS — I10 Essential (primary) hypertension: Secondary | ICD-10-CM

## 2013-04-19 MED ORDER — MONTELUKAST SODIUM 10 MG PO TABS: 10.0000 mg | ORAL_TABLET | Freq: Every day | ORAL | Status: DC | PRN

## 2013-04-19 MED ORDER — PREDNISONE 20 MG PO TABS: 20.0000 mg | ORAL_TABLET | Freq: Two times a day (BID) | ORAL | Status: DC

## 2013-04-19 MED ORDER — CEFDINIR 300 MG PO CAPS: 300.0000 mg | ORAL_CAPSULE | Freq: Two times a day (BID) | ORAL | Status: AC

## 2013-04-19 NOTE — Patient Instructions (Signed)
Probiotic such as Digestive advantage daily   Acute Bronchitis You have acute bronchitis. This means you have a chest cold. The airways in your lungs are red and sore (inflamed). Acute means it is sudden onset.  CAUSES Bronchitis is most often caused by the same virus that causes a cold. SYMPTOMS   Body aches.  Chest congestion.  Chills.  Cough.  Fever.  Shortness of breath.  Sore throat. TREATMENT  Acute bronchitis is usually treated with rest, fluids, and medicines for relief of fever or cough. Most symptoms should go away after a few days or a week. Increased fluids may help thin your secretions and will prevent dehydration. Your caregiver may give you an inhaler to improve your symptoms. The inhaler reduces shortness of breath and helps control cough. You can take over-the-counter pain relievers or cough medicine to decrease coughing, pain, or fever. A cool-air vaporizer may help thin bronchial secretions and make it easier to clear your chest. Antibiotics are usually not needed but can be prescribed if you smoke, are seriously ill, have chronic lung problems, are elderly, or you are at higher risk for developing complications.Allergies and asthma can make bronchitis worse. Repeated episodes of bronchitis may cause longstanding lung problems. Avoid smoking and secondhand smoke.Exposure to cigarette smoke or irritating chemicals will make bronchitis worse. If you are a cigarette smoker, consider using nicotine gum or skin patches to help control withdrawal symptoms. Quitting smoking will help your lungs heal faster. Recovery from bronchitis is often slow, but you should start feeling better after 2 to 3 days. Cough from bronchitis frequently lasts for 3 to 4 weeks. To prevent another bout of acute bronchitis:  Quit smoking.  Wash your hands frequently to get rid of viruses or use a hand sanitizer.  Avoid other people with cold or virus symptoms.  Try not to touch your hands to  your mouth, nose, or eyes. SEEK IMMEDIATE MEDICAL CARE IF:  You develop increased fever, chills, or chest pain.  You have severe shortness of breath or bloody sputum.  You develop dehydration, fainting, repeated vomiting, or a severe headache.  You have no improvement after 1 week of treatment or you get worse. MAKE SURE YOU:   Understand these instructions.  Will watch your condition.  Will get help right away if you are not doing well or get worse. Document Released: 09/26/2004 Document Revised: 11/11/2011 Document Reviewed: 12/12/2010 [facility] Patient Information 2014 [place], [place].

## 2013-04-19 NOTE — Assessment & Plan Note (Addendum)
Adequate control, no changes 

## 2013-04-25 ENCOUNTER — Encounter: Payer: Self-pay | Admitting: Family Medicine

## 2013-04-25 DIAGNOSIS — J209 Acute bronchitis, unspecified: Secondary | ICD-10-CM | POA: Insufficient documentation

## 2013-04-25 NOTE — Assessment & Plan Note (Signed)
Started on antibiotics and probiotics, increase rest and hydration 

## 2013-04-25 NOTE — Progress Notes (Signed)
Patient ID: [name], male   DOB: [birth year], 67 y.o.   MRN: [id number] [name] [id number] 03-06-1946 04/25/2013      Progress Note-Follow Up  Subjective  Chief Complaint  Chief Complaint  Patient presents with  . Follow-up    3 month- back pain- much better  . chest congestion    off and on since feb- got better in the spring and worse late june    HPI  Patient is a 67 year old male who is in today for followup. His back is feeling much better with recent treatment. He is complaining of increased chest congestion. He's been having trouble off and on for a few months but it's recently worsened. Cough is productive of green phlegm. He got malaise and shortness of breath as well. No obvious fevers or chills. No headaches or chest pains. No palpitations GI or GU complaints. Has some mild head congestion but no rhinorrhea  Past Medical History  Diagnosis Date  . Allergic rhinitis   . COPD (chronic obstructive pulmonary disease)     FeV1 64%-2007  . GERD (gastroesophageal reflux disease)   . History of lung abscess     bronchiectasis with RMLandRLL ersection -1996- Dr Edwyna Shell  . Hypertension   . Osteoarthritis     hands and knees  . Emphysema   . Impaired vision     glasses  . Back pain 01/12/2013    Past Surgical History  Procedure Laterality Date  . Lung surgery      RML andRUL removed due to bleeding and bronchiectasis and lung abcess  . Neck surgery  05/2008  . Lower back surgery  09/2008    06/2009  . Hernia repair  10/2009  . Left knee    . Right foot surgery      Family History  Problem Relation Age of Onset  . Prostate cancer Father     father died prostate ca  . Coronary artery disease      1st degree relative<60  . Stroke      1st degree relative<50  . Heart failure Mother     age 38  . Colon cancer Paternal Grandmother     History   Social History  . Marital Status: Married    Spouse Name: N/A    Number of Children: N/A  . Years of  Education: N/A   Occupational History  . retired Librarian, academic    Social History Main Topics  . Smoking status: Former Smoker -- 1.50 packs/day for 38 years    Types: Cigarettes    Quit date: 09/03/2000  . Smokeless tobacco: Never Used  . Alcohol Use: 1.2 - 1.8 oz/week    2-3 Cans of beer per week  . Drug Use: Not on file  . Sexual Activity: Not on file   Other Topics Concern  . Not on file   Social History Narrative   Retired Librarian, academic   Patient states former smoker. 1 1/2 ppd x 38 yrs  Quit in Jan 2002   Married - 2 weeks (4th marriage)   divorced,  remarried 84-2004 (lost wife to lung ca),  remarried (divorced),    1 son  - 5 [name])   Alcohol use-yes (2-3 beers per week)            Current Outpatient Prescriptions on File Prior to Visit  Medication Sig Dispense Refill  . albuterol (PROAIR HFA) 108 (90 BASE) MCG/ACT inhaler Inhale 2 puffs into the  lungs every 6 (six) hours as needed.  18 g  5  . amLODipine (NORVASC) 5 MG tablet Take 1 tablet (5 mg total) by mouth daily.  90 tablet  2  . aspirin-acetaminophen-caffeine (EXCEDRIN MIGRAINE) 250-250-65 MG per tablet Take 1 tablet by mouth every 6 (six) hours as needed.      . calcium carbonate (TUMS EX) 750 MG chewable tablet Chew 1 tablet by mouth 2 (two) times daily.        . diclofenac sodium (VOLTAREN) 1 % GEL Apply 1 application topically 4 (four) times daily as needed.  1 Tube  2  . fluticasone (FLONASE) 50 MCG/ACT nasal spray Place 2 sprays into the nose daily.  16 g  12  . glucosamine-chondroitin 500-400 MG tablet Take 1 tablet by mouth 2 (two) times daily.        Marland Kitchen guaiFENesin (MUCINEX) 600 MG 12 hr tablet Take 600 mg by mouth 2 (two) times daily.        Marland Kitchen HYDROcodone-acetaminophen (NORCO/VICODIN) 5-325 MG per tablet Take 1 tablet by mouth every 6 (six) hours as needed for pain.  20 tablet  0  . losartan (COZAAR) 100 MG tablet Take 0.5 tablets (50 mg total) by mouth daily.  90 tablet  1  .  meloxicam (MOBIC) 15 MG tablet Take 1 tablet (15 mg total) by mouth daily as needed.  90 tablet  1  . mometasone-formoterol (DULERA) 200-5 MCG/ACT AERO Inhale 2 puffs into the lungs 2 (two) times daily.  1 Inhaler  0  . Multiple Vitamin (MULTIVITAMIN) tablet Take 1 tablet by mouth 3 (three) times a week.        . traMADol (ULTRAM) 50 MG tablet Take 1 tablet (50 mg total) by mouth every 8 (eight) hours as needed for pain.  30 tablet  0   No current facility-administered medications on file prior to visit.    No Known Allergies  Review of Systems  Review of Systems  Constitutional: Negative for fever and malaise/fatigue.  HENT: Negative for congestion.   Eyes: Negative for discharge.  Respiratory: Negative for shortness of breath.   Cardiovascular: Negative for chest pain, palpitations and leg swelling.  Gastrointestinal: Negative for nausea, abdominal pain and diarrhea.  Genitourinary: Negative for dysuria.  Musculoskeletal: Negative for falls.  Skin: Negative for rash.  Neurological: Negative for loss of consciousness and headaches.  Endo/Heme/Allergies: Negative for polydipsia.  Psychiatric/Behavioral: Negative for depression and suicidal ideas. The patient is not nervous/anxious and does not have insomnia.     Objective  BP 148/70  Pulse 75  Temp(Src) 98.2 F (36.8 C) (Oral)  Ht 5\' 8"  (1.727 m)  Wt 153 lb 1.3 oz (69.437 kg)  BMI 23.28 kg/m2  SpO2 97%  Physical Exam  Physical Exam  Constitutional: He is oriented to person, place, and time and well-developed, well-nourished, and in no distress. No distress.  HENT:  Head: Normocephalic and atraumatic.  Eyes: Conjunctivae are normal.  Neck: Neck supple. No thyromegaly present.  Cardiovascular: Normal rate, regular rhythm and normal heart sounds.   No murmur heard. Pulmonary/Chest: Effort normal and breath sounds normal. No respiratory distress.  Abdominal: He exhibits no distension and no mass. There is no tenderness.   Musculoskeletal: He exhibits no edema.  Neurological: He is alert and oriented to person, place, and time.  Skin: Skin is warm.  Psychiatric: Memory, affect and judgment normal.    Lab Results  Component Value Date   TSH 0.679 07/16/2011   Lab Results  Component Value Date   WBC 5.6 07/06/2012   HGB 14.7 07/06/2012   HCT 43.2 07/06/2012   MCV 90.4 07/06/2012   PLT 293 07/06/2012   Lab Results  Component Value Date   CREATININE 0.74 01/04/2013   BUN 19 01/04/2013   NA 139 01/04/2013   K 4.2 01/04/2013   CL 106 01/04/2013   CO2 24 01/04/2013   Lab Results  Component Value Date   ALT 15 07/06/2012   AST 20 07/06/2012   ALKPHOS 67 07/06/2012   BILITOT 1.1 07/06/2012   Lab Results  Component Value Date   CHOL 163 07/06/2012   Lab Results  Component Value Date   HDL 52 07/06/2012   Lab Results  Component Value Date   LDLCALC 97 07/06/2012   Lab Results  Component Value Date   TRIG 71 07/06/2012   Lab Results  Component Value Date   CHOLHDL 3.1 07/06/2012     Assessment & Plan  HYPERTENSION Adequate control, no changes  Back pain Improved with recent treatment  Acute bronchitis Started on antibiotics and probiotics, increase rest and hydration

## 2013-04-25 NOTE — Assessment & Plan Note (Signed)
Improved with recent treatment 

## 2013-04-26 ENCOUNTER — Ambulatory Visit: Payer: Medicare Other | Admitting: Physician Assistant

## 2013-05-05 ENCOUNTER — Other Ambulatory Visit: Payer: Self-pay

## 2013-05-05 MED ORDER — AMLODIPINE BESYLATE 5 MG PO TABS: 5.0000 mg | ORAL_TABLET | Freq: Every day | ORAL | Status: DC

## 2013-05-31 ENCOUNTER — Telehealth: Payer: Self-pay | Admitting: *Deleted

## 2013-05-31 NOTE — Telephone Encounter (Signed)
Received fax refill request for Albuterol HFA & Meloxicam from [facility] in [place], who crossed out their information on forms and Handwrote "please send to [facility]" at top of pages. Called [facility] to inquire & they stated that pt had recently transferred but they had not received request to have any medications refilled from pt; called pt and inquired on his behalf, and he stated that he did not need these medications & had not requested [facility] pharmacy to send any request on his behalf and to disregard the faxed request & he will request from [facility] pharmacy when needed/SLS

## 2013-06-21 ENCOUNTER — Encounter: Payer: Self-pay | Admitting: Critical Care Medicine

## 2013-06-21 ENCOUNTER — Telehealth: Payer: Self-pay | Admitting: Family Medicine

## 2013-06-21 ENCOUNTER — Ambulatory Visit (INDEPENDENT_AMBULATORY_CARE_PROVIDER_SITE_OTHER): Payer: Medicare Other | Admitting: Critical Care Medicine

## 2013-06-21 VITALS — BP 146/80 | HR 87 | Temp 98.8°F | Ht 68.0 in | Wt 155.0 lb

## 2013-06-21 DIAGNOSIS — Z0000 Encounter for general adult medical examination without abnormal findings: Secondary | ICD-10-CM

## 2013-06-21 DIAGNOSIS — I10 Essential (primary) hypertension: Secondary | ICD-10-CM

## 2013-06-21 DIAGNOSIS — J449 Chronic obstructive pulmonary disease, unspecified: Secondary | ICD-10-CM

## 2013-06-21 NOTE — Telephone Encounter (Signed)
Week of 11-11 lab order Check out comments: Next appt annual exam with labs, lipid renal, cbc, tsh, hepatic, psa

## 2013-06-21 NOTE — Progress Notes (Signed)
Subjective:    Patient ID: [name], male    DOB: 07-11-1946, 67 y.o.   MRN: [id number]  HPI   02/18/2013 Doing better. Less issues Pt denies any significant sore throat, nasal congestion or excess secretions, fever, chills, sweats, unintended weight loss, pleurtic or exertional chest pain, orthopnea PND, or leg swelling Pt denies any increase in rescue therapy over baseline, denies waking up needing it or having any early am or nocturnal exacerbations of coughing/wheezing/or dyspnea. Pt also denies any obvious fluctuation in symptoms with  weather or environmental change or other alleviating or aggravating factors  06/21/2013 Chief Complaint  Patient presents with  . 4 month follow up    Breathing has improved now that he is on 3 sinus meds.  Does DOE, some wheezing, and coughing a little with mostly clear mucus.    Pt in with [facility] for shoulder pain, pt had issues with dyspnea, pt had sinus complaints.  Pt on 2nd and 3rd sinus med and this has helped  (flonase/montelukast/claritin)   Review of Systems  Constitutional:   No  weight loss, night sweats,  Fevers, chills, fatigue, lassitude. HEENT:   No headaches,  Difficulty swallowing,  Tooth/dental problems, NO Sore throat,                Notes  sneezing, itching, ear ache,notes   +nasal congestion,notes post nasal drip,   CV:  No chest pain,  Orthopnea, PND, swelling in lower extremities, anasarca, dizziness, palpitations  GI  No heartburn, indigestion, abdominal pain, nausea, vomiting, diarrhea, change in bowel habits, loss of appetite  .  No chest wall deformity  Skin: no rash or lesions.  GU: no dysuria, change in color of urine, no urgency or frequency.  No flank pain.  MS:  No joint pain or swelling.  No decreased range of motion.  No back pain.  Psych:  No change in mood or affect. No depression or anxiety.  No memory loss.     Objective:   Physical Exam   Filed Vitals:   06/21/13 1624  BP: 146/80  Pulse:  87  Temp: 98.8 F (37.1 C)  TempSrc: Oral  Height: 5\' 8"  (1.727 m)  Weight: 155 lb (70.308 kg)  SpO2: 95%    Gen: Pleasant, well-nourished, in no distress,  normal affect  ENT: No lesions,  mouth clear,  oropharynx clear, +postnasal drip,  Neck: No JVD, no TMG, no carotid bruits  Lungs: No use of accessory muscles, no dullness to percussion, clearer  Cardiovascular: RRR, heart sounds normal, no murmur or gallops, no peripheral edema  Abdomen: soft and NT, no HSM,  BS normal  Musculoskeletal: No deformities, no cyanosis or clubbing  Neuro: alert, non focal  Skin: Warm, no lesions or rashes  No results found.       Assessment & Plan:   COPD gold stage C. Gold C Copd stable at present Plan No change in inhaled or maintenance medications. Return in  6 months    Updated Medication List Outpatient Encounter Prescriptions as of 06/21/2013  Medication Sig Dispense Refill  . albuterol (PROAIR HFA) 108 (90 BASE) MCG/ACT inhaler Inhale 2 puffs into the lungs every 6 (six) hours as needed.  18 g  5  . amLODipine (NORVASC) 5 MG tablet Take 1 tablet (5 mg total) by mouth daily.  90 tablet  1  . aspirin-acetaminophen-caffeine (EXCEDRIN MIGRAINE) 250-250-65 MG per tablet Take 1 tablet by mouth every 6 (six) hours as needed.      Marland Kitchen  calcium carbonate (TUMS EX) 750 MG chewable tablet Chew 1 tablet by mouth 2 (two) times daily.        . diclofenac sodium (VOLTAREN) 1 % GEL Apply 1 application topically 4 (four) times daily as needed.  1 Tube  2  . fluticasone (FLONASE) 50 MCG/ACT nasal spray Place 2 sprays into the nose daily.  16 g  12  . glucosamine-chondroitin 500-400 MG tablet Take 1 tablet by mouth 2 (two) times daily.        Marland Kitchen guaiFENesin (MUCINEX) 600 MG 12 hr tablet Take 600 mg by mouth 2 (two) times daily.        Marland Kitchen HYDROcodone-acetaminophen (NORCO/VICODIN) 5-325 MG per tablet Take 1 tablet by mouth every 6 (six) hours as needed for pain.  20 tablet  0  . loratadine  (CLARITIN) 10 MG tablet Take 10 mg by mouth daily.      Marland Kitchen losartan (COZAAR) 100 MG tablet Take 0.5 tablets (50 mg total) by mouth daily.  90 tablet  1  . meloxicam (MOBIC) 15 MG tablet Take 1 tablet (15 mg total) by mouth daily as needed.  90 tablet  1  . mometasone-formoterol (DULERA) 200-5 MCG/ACT AERO Inhale 2 puffs into the lungs 2 (two) times daily.  1 Inhaler  0  . montelukast (SINGULAIR) 10 MG tablet Take 10 mg by mouth daily.      . Multiple Vitamin (MULTIVITAMIN) tablet Take 1 tablet by mouth 3 (three) times a week.        . traMADol (ULTRAM) 50 MG tablet Take 1 tablet (50 mg total) by mouth every 8 (eight) hours as needed for pain.  30 tablet  0  . [DISCONTINUED] montelukast (SINGULAIR) 10 MG tablet Take 1 tablet (10 mg total) by mouth daily as needed.  30 tablet  3  . [DISCONTINUED] predniSONE (DELTASONE) 20 MG tablet Take 1 tablet (20 mg total) by mouth 2 (two) times daily.  10 tablet  0  . [DISCONTINUED] triamcinolone (NASACORT) 55 MCG/ACT nasal inhaler        No facility-administered encounter medications on file as of 06/21/2013.

## 2013-06-21 NOTE — Patient Instructions (Signed)
No change in medications. Return in         4 months 

## 2013-06-22 NOTE — Assessment & Plan Note (Signed)
Gold C Copd stable at present Plan No change in inhaled or maintenance medications. Return in  6 months 

## 2013-07-06 ENCOUNTER — Other Ambulatory Visit: Payer: Self-pay

## 2013-07-06 MED ORDER — MELOXICAM 15 MG PO TABS: 15.0000 mg | ORAL_TABLET | Freq: Every day | ORAL | Status: DC | PRN

## 2013-07-06 NOTE — Telephone Encounter (Signed)
Please advise refill? Last RX was done on 01-08-13 #90 with 1 refill

## 2013-07-07 MED ORDER — MELOXICAM 15 MG PO TABS: 15.0000 mg | ORAL_TABLET | Freq: Every day | ORAL | Status: DC | PRN

## 2013-07-07 NOTE — Addendum Note (Signed)
Addended by: Mervin Kung A on: 07/07/2013 04:04 PM   Modules accepted: Orders

## 2013-07-07 NOTE — Telephone Encounter (Addendum)
Received message from pt that we were supposed to have changed his pharmacy to [facility] and meloxicam refill was supposed to have gone there instead of [facility]. Resent rx to [facility] and notified pt. Cancelled previous rx that was sent to [facility].

## 2013-07-15 LAB — CBC
HCT: 41.4 % (ref 39.0–52.0)
Hemoglobin: 14.4 g/dL (ref 13.0–17.0)
MCH: 30.9 pg (ref 26.0–34.0)
MCHC: 34.8 g/dL (ref 30.0–36.0)
MCV: 88.8 fL (ref 78.0–100.0)
Platelets: 272 10*3/uL (ref 150–400)
RBC: 4.66 MIL/uL (ref 4.22–5.81)
RDW: 13.4 % (ref 11.5–15.5)
WBC: 5.2 10*3/uL (ref 4.0–10.5)

## 2013-07-15 LAB — RENAL FUNCTION PANEL
Albumin: 4.1 g/dL (ref 3.5–5.2)
BUN: 23 mg/dL (ref 6–23)
CO2: 25 mEq/L (ref 19–32)
Calcium: 9.5 mg/dL (ref 8.4–10.5)
Chloride: 104 mEq/L (ref 96–112)
Creat: 0.75 mg/dL (ref 0.50–1.35)
Glucose, Bld: 98 mg/dL (ref 70–99)
Phosphorus: 2.9 mg/dL (ref 2.3–4.6)
Potassium: 4.3 mEq/L (ref 3.5–5.3)
Sodium: 138 mEq/L (ref 135–145)

## 2013-07-15 LAB — HEPATIC FUNCTION PANEL
ALT: 15 U/L (ref 0–53)
AST: 19 U/L (ref 0–37)
Albumin: 4.1 g/dL (ref 3.5–5.2)
Alkaline Phosphatase: 71 U/L (ref 39–117)
Bilirubin, Direct: 0.2 mg/dL (ref 0.0–0.3)
Indirect Bilirubin: 0.9 mg/dL (ref 0.0–0.9)
Total Bilirubin: 1.1 mg/dL (ref 0.3–1.2)
Total Protein: 6.6 g/dL (ref 6.0–8.3)

## 2013-07-15 LAB — LIPID PANEL
Cholesterol: 163 mg/dL (ref 0–200)
HDL: 55 mg/dL (ref >39)
LDL Cholesterol: 93 mg/dL (ref 0–99)
Total CHOL/HDL Ratio: 3 Ratio
Triglycerides: 73 mg/dL (ref <150)
VLDL: 15 mg/dL (ref 0–40)

## 2013-07-15 LAB — TSH: TSH: 0.902 u[IU]/mL (ref 0.350–4.500)

## 2013-07-15 LAB — PSA: PSA: 1.48 ng/mL (ref <=4.00)

## 2013-07-19 ENCOUNTER — Encounter (INDEPENDENT_AMBULATORY_CARE_PROVIDER_SITE_OTHER): Payer: Medicare Other | Admitting: Family Medicine

## 2013-07-19 ENCOUNTER — Encounter: Payer: Self-pay | Admitting: Physician Assistant

## 2013-07-19 ENCOUNTER — Ambulatory Visit (INDEPENDENT_AMBULATORY_CARE_PROVIDER_SITE_OTHER): Payer: Medicare Other | Admitting: Physician Assistant

## 2013-07-19 VITALS — BP 148/74 | HR 71 | Temp 98.0°F | Resp 16 | Ht 68.0 in | Wt 155.5 lb

## 2013-07-19 DIAGNOSIS — I10 Essential (primary) hypertension: Secondary | ICD-10-CM

## 2013-07-19 DIAGNOSIS — Z0000 Encounter for general adult medical examination without abnormal findings: Secondary | ICD-10-CM

## 2013-07-19 DIAGNOSIS — J449 Chronic obstructive pulmonary disease, unspecified: Secondary | ICD-10-CM

## 2013-07-19 DIAGNOSIS — Z136 Encounter for screening for cardiovascular disorders: Secondary | ICD-10-CM

## 2013-07-19 DIAGNOSIS — K219 Gastro-esophageal reflux disease without esophagitis: Secondary | ICD-10-CM

## 2013-07-19 DIAGNOSIS — I499 Cardiac arrhythmia, unspecified: Secondary | ICD-10-CM

## 2013-07-19 DIAGNOSIS — I4510 Unspecified right bundle-branch block: Secondary | ICD-10-CM

## 2013-07-19 DIAGNOSIS — J309 Allergic rhinitis, unspecified: Secondary | ICD-10-CM

## 2013-07-19 MED ORDER — MONTELUKAST SODIUM 10 MG PO TABS: 10.0000 mg | ORAL_TABLET | Freq: Every day | ORAL | Status: DC

## 2013-07-19 NOTE — Assessment & Plan Note (Signed)
BP mildly elevated in clinic.  Discussed DASH diet vs increasing medication dosage.  Patient wants to attempt a trial of DASH diet to help lower his BP.  Handout given to patient.  Follow-up in 1 month.

## 2013-07-19 NOTE — Assessment & Plan Note (Signed)
EKG performed due to extrasystole noted on auscultation.  EKG shows sinus rhythm at rate of 75 with occasional ectopic ventricular beat.  Incomplete RBBB noted.  Giving patient's history of HTN, will make a referral to Cardiology for assessment and evaluation.

## 2013-07-19 NOTE — Assessment & Plan Note (Signed)
Labs reviewed with patient.  Patient UTD on health maintenance. Follow-up in 1 year for annual exam with labs.

## 2013-07-19 NOTE — Progress Notes (Signed)
Patient ID: [name], male   DOB: 1945-12-09, 67 y.o.   MRN: [id number]  Patient presents to clinic today for annual exam.    Acute Concerns: Patient denies any concerns at today's visit  Chronic Issues: (1) Hypertension -- Patient endorses BP well controlled at home with 5 mg amlodipine and 50 mg losartan daily.  Patient states he has not checked his BP in a while but has a machine at home.  Denies chest pain, palpitations, shortness of breath (above baseline w/ COPD), LH, dizziness or syncope.  (2) COPD -- Patient followed by Pulmonology. Currently on Dulera, Singulair and Proair that he takes as prescribed. Denies shortness of breath at rest.  Denies wheezing.  (3) GERD -- Occasional symptoms controlled with TUMS.  Denies epigastric discomfort, globus or halitosis.  Watches what he eats.  (4) Allergic Rhinitis -- reports year-round symptoms.  Takes daily claritin as well as inhalers for his COPD.  Health Maintenance: Dental -- Patient is edentulous.  Wears false teeth Vision -- UTD Colonoscopy -- last in 2012. Denies abnormal findings.  Repeat in 2022. Immunizations -- UTD.  Reports getting pneumonia vaccine in September at a [facility].  Past Medical History  Diagnosis Date  . Allergic rhinitis   . COPD (chronic obstructive pulmonary disease)     FeV1 64%-2007  . GERD (gastroesophageal reflux disease)   . History of lung abscess     bronchiectasis with RMLandRLL ersection -1996- Dr Edwyna Shell  . Hypertension   . Osteoarthritis     hands and knees  . Emphysema   . Impaired vision     glasses  . Back pain 01/12/2013    Current Outpatient Prescriptions on File Prior to Visit  Medication Sig Dispense Refill  . albuterol (PROAIR HFA) 108 (90 BASE) MCG/ACT inhaler Inhale 2 puffs into the lungs every 6 (six) hours as needed.  18 g  5  . amLODipine (NORVASC) 5 MG tablet Take 1 tablet (5 mg total) by mouth daily.  90 tablet  1  . aspirin-acetaminophen-caffeine (EXCEDRIN MIGRAINE)  250-250-65 MG per tablet Take 1 tablet by mouth every 6 (six) hours as needed.      . calcium carbonate (TUMS EX) 750 MG chewable tablet Chew 1 tablet by mouth 2 (two) times daily.        . diclofenac sodium (VOLTAREN) 1 % GEL Apply 1 application topically 4 (four) times daily as needed.  1 Tube  2  . fluticasone (FLONASE) 50 MCG/ACT nasal spray Place 2 sprays into the nose daily.  16 g  12  . glucosamine-chondroitin 500-400 MG tablet Take 1 tablet by mouth 2 (two) times daily.        Marland Kitchen guaiFENesin (MUCINEX) 600 MG 12 hr tablet Take 600 mg by mouth 2 (two) times daily.        Marland Kitchen HYDROcodone-acetaminophen (NORCO/VICODIN) 5-325 MG per tablet Take 1 tablet by mouth every 6 (six) hours as needed for pain.  20 tablet  0  . loratadine (CLARITIN) 10 MG tablet Take 10 mg by mouth daily.      Marland Kitchen losartan (COZAAR) 100 MG tablet Take 0.5 tablets (50 mg total) by mouth daily.  90 tablet  1  . meloxicam (MOBIC) 15 MG tablet Take 1 tablet (15 mg total) by mouth daily as needed.  90 tablet  1  . mometasone-formoterol (DULERA) 200-5 MCG/ACT AERO Inhale 2 puffs into the lungs 2 (two) times daily.  1 Inhaler  0  . Multiple Vitamin (MULTIVITAMIN) tablet Take  1 tablet by mouth 3 (three) times a week.        . traMADol (ULTRAM) 50 MG tablet Take 1 tablet (50 mg total) by mouth every 8 (eight) hours as needed for pain.  30 tablet  0   No current facility-administered medications on file prior to visit.    No Known Allergies  Family History  Problem Relation Age of Onset  . Prostate cancer Father     father died prostate ca  . Coronary artery disease      1st degree relative<60  . Stroke      1st degree relative<50  . Heart failure Mother     age 30  . Colon cancer Paternal Grandmother     History   Social History  . Marital Status: Married    Spouse Name: N/A    Number of Children: N/A  . Years of Education: N/A   Occupational History  . retired Librarian, academic    Social History Main Topics   . Smoking status: Former Smoker -- 1.50 packs/day for 38 years    Types: Cigarettes    Quit date: 09/03/2000  . Smokeless tobacco: Never Used  . Alcohol Use: 1.2 - 1.8 oz/week    2-3 Cans of beer per week  . Drug Use: No  . Sexual Activity: No   Other Topics Concern  . None   Social History Narrative   Retired Librarian, academic   Patient states former smoker. 1 1/2 ppd x 38 yrs  Quit in Jan 2002   Married - 2 weeks (4th marriage)   divorced,  remarried 84-2004 (lost wife to lung ca),  remarried (divorced),    1 son  - 28 [name])   Alcohol use-yes (2-3 beers per week)           Review of Systems  Constitutional: Negative for fever, chills, weight loss and malaise/fatigue.  HENT: Negative for ear pain, hearing loss and tinnitus.   Eyes: Negative for blurred vision, double vision, photophobia and pain.  Respiratory: Positive for cough, shortness of breath and wheezing.        Baseline with COPD.  Cardiovascular: Negative for chest pain and palpitations.  Gastrointestinal: Positive for heartburn. Negative for nausea, vomiting, abdominal pain, diarrhea, constipation, blood in stool and melena.  Genitourinary: Negative for dysuria, urgency, frequency, hematuria and flank pain.  Musculoskeletal: Positive for back pain and joint pain.  Neurological: Negative for dizziness, seizures, loss of consciousness and headaches.  Endo/Heme/Allergies: Positive for environmental allergies.  Psychiatric/Behavioral: Negative for depression, suicidal ideas, hallucinations and substance abuse. The patient is not nervous/anxious and does not have insomnia.    Filed Vitals:   07/19/13 1102  BP: 148/74  Pulse: 71  Temp: 98 F (36.7 C)  Resp: 16   Physical Exam  Vitals reviewed. Constitutional: He is oriented to person, place, and time and well-developed, well-nourished, and in no distress.  HENT:  Head: Normocephalic and atraumatic.  Right Ear: External ear normal.  Left Ear: External  ear normal.  Nose: Nose normal.  Mouth/Throat: Oropharynx is clear and moist. No oropharyngeal exudate.  Tympanic membranes within normal limits bilaterally  Eyes: Conjunctivae and EOM are normal. Pupils are equal, round, and reactive to light.  Neck: Neck supple.  Cardiovascular: Normal rate and intact distal pulses.   Extrasystole noted on exam.  No murmur auscultated on exam.  Pulmonary/Chest: Effort normal and breath sounds normal. No respiratory distress. He has no wheezes. He has no rales.  He exhibits no tenderness.  Abdominal: Soft. Bowel sounds are normal. He exhibits no distension and no mass. There is no tenderness. There is no rebound and no guarding.  Lymphadenopathy:    He has no cervical adenopathy.  Neurological: He is alert and oriented to person, place, and time. No cranial nerve deficit.  Skin: Skin is warm and dry. No rash noted.  Psychiatric: Affect normal.   Recent Results (from the past 2160 hour(s))  LIPID PANEL     Status: None   Collection Time    07/15/13  9:03 AM      Result Value Range   Cholesterol 163  0 - 200 mg/dL   Comment: ATP III Classification:           < 200        mg/dL        Desirable          200 - 239     mg/dL        Borderline High          >= 240        mg/dL        High         Triglycerides 73  <150 mg/dL   HDL 55  >16 mg/dL   Total CHOL/HDL Ratio 3.0     VLDL 15  0 - 40 mg/dL   LDL Cholesterol 93  0 - 99 mg/dL   Comment:       Total Cholesterol/HDL Ratio:CHD Risk                            Coronary Heart Disease Risk Table                                            Men       Women              1/2 Average Risk              3.4        3.3                  Average Risk              5.0        4.4               2X Average Risk              9.6        7.1               3X Average Risk             23.4       11.0     Use the calculated Patient Ratio above and the CHD Risk table      to determine the patient's CHD Risk.     ATP III  Classification (LDL):           < 100        mg/dL         Optimal          100 - 129     mg/dL         Near or Above Optimal  130 - 159     mg/dL         Borderline High          160 - 189     mg/dL         High           > 190        mg/dL         Very High        RENAL FUNCTION PANEL     Status: None   Collection Time    07/15/13  9:03 AM      Result Value Range   Sodium 138  135 - 145 mEq/L   Potassium 4.3  3.5 - 5.3 mEq/L   Chloride 104  96 - 112 mEq/L   CO2 25  19 - 32 mEq/L   Glucose, Bld 98  70 - 99 mg/dL   BUN 23  6 - 23 mg/dL   Creat 4.09  8.11 - 9.14 mg/dL   Albumin 4.1  3.5 - 5.2 g/dL   Calcium 9.5  8.4 - 78.2 mg/dL   Phosphorus 2.9  2.3 - 4.6 mg/dL  CBC     Status: None   Collection Time    07/15/13  9:03 AM      Result Value Range   WBC 5.2  4.0 - 10.5 K/uL   RBC 4.66  4.22 - 5.81 MIL/uL   Hemoglobin 14.4  13.0 - 17.0 g/dL   HCT 95.6  21.3 - 08.6 %   MCV 88.8  78.0 - 100.0 fL   MCH 30.9  26.0 - 34.0 pg   MCHC 34.8  30.0 - 36.0 g/dL   RDW 57.8  46.9 - 62.9 %   Platelets 272  150 - 400 K/uL  TSH     Status: None   Collection Time    07/15/13  9:03 AM      Result Value Range   TSH 0.902  0.350 - 4.500 uIU/mL  HEPATIC FUNCTION PANEL     Status: None   Collection Time    07/15/13  9:03 AM      Result Value Range   Total Bilirubin 1.1  0.3 - 1.2 mg/dL   Bilirubin, Direct 0.2  0.0 - 0.3 mg/dL   Indirect Bilirubin 0.9  0.0 - 0.9 mg/dL   Alkaline Phosphatase 71  39 - 117 U/L   AST 19  0 - 37 U/L   ALT 15  0 - 53 U/L   Total Protein 6.6  6.0 - 8.3 g/dL   Albumin 4.1  3.5 - 5.2 g/dL  PSA     Status: None   Collection Time    07/15/13  9:03 AM      Result Value Range   PSA 1.48  <=4.00 ng/mL   Comment: Test Methodology: ECLIA PSA (Electrochemiluminescence Immunoassay)           For PSA values from 2.5-4.0, particularly in younger men <60 years     old, the AUA and NCCN suggest testing for % Free PSA (3515) and     evaluation of the rate of  increase in PSA (PSA velocity).    Assessment/Plan: No problem-specific assessment & plan notes found for this encounter.

## 2013-07-19 NOTE — Patient Instructions (Signed)
Please read information below on DASH diet to help with your BP.  Please continue exercise regimen.  Take all medications as prescribed.  Follow-up in 1-2 months for BP recheck.  Return in 1 year for annual exam and labs.  DASH Diet The DASH diet stands for "Dietary Approaches to Stop Hypertension." It is a healthy eating plan that has been shown to reduce high blood pressure (hypertension) in as little as 14 days, while also possibly providing other significant health benefits. These other health benefits include reducing the risk of breast cancer after menopause and reducing the risk of type 2 diabetes, heart disease, colon cancer, and stroke. Health benefits also include weight loss and slowing kidney failure in patients with chronic kidney disease.  DIET GUIDELINES  Limit salt (sodium). Your diet should contain less than 1500 mg of sodium daily.  Limit refined or processed carbohydrates. Your diet should include mostly whole grains. Desserts and added sugars should be used sparingly.  Include small amounts of heart-healthy fats. These types of fats include nuts, oils, and tub margarine. Limit saturated and trans fats. These fats have been shown to be harmful in the body. CHOOSING FOODS  The following food groups are based on a 2000 calorie diet. See your Registered Dietitian for individual calorie needs. Grains and Grain Products (6 to 8 servings daily)  Eat More Often: Whole-wheat bread, brown rice, whole-grain or wheat pasta, quinoa, popcorn without added fat or salt (air popped).  Eat Less Often: White bread, white pasta, white rice, cornbread. Vegetables (4 to 5 servings daily)  Eat More Often: Fresh, frozen, and canned vegetables. Vegetables may be raw, steamed, roasted, or grilled with a minimal amount of fat.  Eat Less Often/Avoid: Creamed or fried vegetables. Vegetables in a cheese sauce. Fruit (4 to 5 servings daily)  Eat More Often: All fresh, canned (in natural juice), or  frozen fruits. Dried fruits without added sugar. One hundred percent fruit juice ( cup [237 mL] daily).  Eat Less Often: Dried fruits with added sugar. Canned fruit in light or heavy syrup. [employer], Fish, and Poultry (2 servings or less daily. One serving is 3 to 4 oz [85-114 g]).  Eat More Often: Ninety percent or leaner ground beef, tenderloin, sirloin. Round cuts of beef, chicken breast, [place] breast. All fish. Grill, bake, or broil your meat. Nothing should be fried.  Eat Less Often/Avoid: Fatty cuts of meat, [place], or chicken leg, thigh, or wing. Fried cuts of meat or fish. Dairy (2 to 3 servings)  Eat More Often: Low-fat or fat-free milk, low-fat plain or light yogurt, reduced-fat or part-skim cheese.  Eat Less Often/Avoid: Milk (whole, 2%).Whole milk yogurt. Full-fat cheeses. Nuts, Seeds, and Legumes (4 to 5 servings per week)  Eat More Often: All without added salt.  Eat Less Often/Avoid: Salted nuts and seeds, canned beans with added salt. Fats and Sweets (limited)  Eat More Often: Vegetable oils, tub margarines without trans fats, sugar-free gelatin. Mayonnaise and salad dressings.  Eat Less Often/Avoid: Coconut oils, palm oils, butter, stick margarine, cream, half and half, cookies, candy, pie. FOR MORE INFORMATION The Dash Diet Eating Plan: www.dashdiet.org Document Released: 08/08/2011 Document Revised: 11/11/2011 Document Reviewed: 08/08/2011 [facility] Patient Information 2014 [place], [place].

## 2013-07-19 NOTE — Assessment & Plan Note (Signed)
Continue current regimen

## 2013-07-19 NOTE — Progress Notes (Signed)
Pre visit review using our clinic review tool, if applicable. No additional management support is needed unless otherwise documented below in the visit note/SLS  

## 2013-07-19 NOTE — Assessment & Plan Note (Signed)
Stable.  Continue medications as prescribed.  Follow-up with Dr. Delford Field as scheduled.

## 2013-07-19 NOTE — Assessment & Plan Note (Signed)
Symptoms controlled with occasional TUMS.

## 2013-07-20 ENCOUNTER — Ambulatory Visit (INDEPENDENT_AMBULATORY_CARE_PROVIDER_SITE_OTHER): Payer: Medicare Other | Admitting: Interventional Cardiology

## 2013-07-20 ENCOUNTER — Telehealth: Payer: Self-pay | Admitting: *Deleted

## 2013-07-20 ENCOUNTER — Encounter: Payer: Self-pay | Admitting: Interventional Cardiology

## 2013-07-20 VITALS — BP 140/80 | HR 72 | Ht 68.0 in | Wt 157.0 lb

## 2013-07-20 DIAGNOSIS — R0602 Shortness of breath: Secondary | ICD-10-CM

## 2013-07-20 LAB — BRAIN NATRIURETIC PEPTIDE: Pro B Natriuretic peptide (BNP): 18 pg/mL (ref 0.0–100.0)

## 2013-07-20 NOTE — Telephone Encounter (Signed)
Fax received, forwarded to PCP, given back to me as we seen pt [phone] for CPE [as favor to PCP running late] last PCP OV [phone], this medication was prescribed by Dr. Delford Field; fax was successfully forwarded to [facility] Pulmonary office/SLS

## 2013-07-20 NOTE — Patient Instructions (Signed)
Your physician has requested that you have an echocardiogram. Echocardiography is a painless test that uses sound waves to create images of your heart. It provides your doctor with information about the size and shape of your heart and how well your heart's chambers and valves are working. This procedure takes approximately one hour. There are no restrictions for this procedure.  Your physician recommends that you return for lab work today for BNP.  

## 2013-07-20 NOTE — Progress Notes (Signed)
Patient ID: [name], male   DOB: 05/10/1946, 67 y.o.   MRN: [id number]     Patient ID: [name] MRN: [id number] DOB/AGE: 66/31/1947 66 y.o.   Referring Physician Dr. Rogelia Rohrer   Reason for Consultation Abnormal ECG  HPI: 69 y/o with COPD.  He had a PVC noted on exam ECG showed IRBBB.  He feels that his stamina is decreased over the past few months.  No PND, orthopnea, or LE edema.  BP is typically in the 130-140s systolic.  He has some indigestion after eating.  No CP with activity.  Only other CP has been associated with coughing.  He remains very active. He hikes and [place] for exercise.   Current Outpatient Prescriptions  Medication Sig Dispense Refill  . albuterol (PROAIR HFA) 108 (90 BASE) MCG/ACT inhaler Inhale 2 puffs into the lungs every 6 (six) hours as needed.  18 g  5  . amLODipine (NORVASC) 5 MG tablet Take 1 tablet (5 mg total) by mouth daily.  90 tablet  1  . aspirin-acetaminophen-caffeine (EXCEDRIN MIGRAINE) 250-250-65 MG per tablet Take 1 tablet by mouth every 6 (six) hours as needed.      . calcium carbonate (TUMS EX) 750 MG chewable tablet Chew 1 tablet by mouth 2 (two) times daily.        . diclofenac sodium (VOLTAREN) 1 % GEL Apply 1 application topically 4 (four) times daily as needed.  1 Tube  2  . fluticasone (FLONASE) 50 MCG/ACT nasal spray Place 2 sprays into the nose daily.  16 g  12  . glucosamine-chondroitin 500-400 MG tablet Take 1 tablet by mouth 2 (two) times daily.        Marland Kitchen guaiFENesin (MUCINEX) 600 MG 12 hr tablet Take 600 mg by mouth 2 (two) times daily.        Marland Kitchen HYDROcodone-acetaminophen (NORCO/VICODIN) 5-325 MG per tablet Take 1 tablet by mouth every 6 (six) hours as needed for pain.  20 tablet  0  . loratadine (CLARITIN) 10 MG tablet Take 10 mg by mouth daily.      Marland Kitchen losartan (COZAAR) 100 MG tablet Take 0.5 tablets (50 mg total) by mouth daily.  90 tablet  1  . meloxicam (MOBIC) 15 MG tablet Take 1 tablet (15 mg total) by mouth daily as  needed.  90 tablet  1  . mometasone-formoterol (DULERA) 200-5 MCG/ACT AERO Inhale 2 puffs into the lungs 2 (two) times daily.  1 Inhaler  0  . montelukast (SINGULAIR) 10 MG tablet Take 1 tablet (10 mg total) by mouth daily.  30 tablet  5  . Multiple Vitamin (MULTIVITAMIN) tablet Take 1 tablet by mouth 3 (three) times a week.        . traMADol (ULTRAM) 50 MG tablet Take 1 tablet (50 mg total) by mouth every 8 (eight) hours as needed for pain.  30 tablet  0   No current facility-administered medications for this visit.   Past Medical History  Diagnosis Date  . Allergic rhinitis   . COPD (chronic obstructive pulmonary disease)     FeV1 64%-2007  . GERD (gastroesophageal reflux disease)   . History of lung abscess     bronchiectasis with RMLandRLL ersection -1996- Dr Edwyna Shell  . Hypertension   . Osteoarthritis     hands and knees  . Emphysema   . Impaired vision     glasses  . Back pain 01/12/2013    Family History  Problem Relation Age of Onset  .  Prostate cancer Father     father died prostate ca  . Coronary artery disease      1st degree relative<60  . Stroke      1st degree relative<50  . Heart failure Mother     age 50  . Colon cancer Paternal Grandmother     History   Social History  . Marital Status: Married    Spouse Name: N/A    Number of Children: N/A  . Years of Education: N/A   Occupational History  . retired Librarian, academic    Social History Main Topics  . Smoking status: Former Smoker -- 1.50 packs/day for 38 years    Types: Cigarettes    Quit date: 09/03/2000  . Smokeless tobacco: Never Used  . Alcohol Use: 1.2 - 1.8 oz/week    2-3 Cans of beer per week  . Drug Use: No  . Sexual Activity: No   Other Topics Concern  . Not on file   Social History Narrative   Retired Librarian, academic   Patient states former smoker. 1 1/2 ppd x 38 yrs  Quit in Jan 2002   Married - 2 weeks (4th marriage)   divorced,  remarried 84-2004 (lost wife to lung  ca),  remarried (divorced),    1 son  - 34 [name])   Alcohol use-yes (2-3 beers per week)            Past Surgical History  Procedure Laterality Date  . Lung surgery      RML andRUL removed due to bleeding and bronchiectasis and lung abcess  . Neck surgery  05/2008  . Lower back surgery  09/2008    06/2009  . Hernia repair  10/2009  . Left knee    . Right foot surgery        (Not in a hospital admission)  Review of systems complete and found to be negative unless listed above .  No nausea, vomiting.  No fever chills, No focal weakness,  No palpitations.  Physical Exam: Filed Vitals:   07/20/13 1313  BP: 140/80  Pulse: 72    Weight: 157 lb (71.215 kg)  Physical exam:  [place]/AT EOMI No JVD, No carotid bruit, scar on the left neck RRR S1S2  No wheezing Soft. NT, nondistended No edema. 2+ left dorsalis pedis pulse, 2+ right posterior tibial pulse No focal motor or sensory deficits Normal affect  Labs:   Lab Results  Component Value Date   WBC 5.2 07/15/2013   HGB 14.4 07/15/2013   HCT 41.4 07/15/2013   MCV 88.8 07/15/2013   PLT 272 07/15/2013    Recent Labs Lab 07/15/13 0903  NA 138  K 4.3  CL 104  CO2 25  BUN 23  CREATININE 0.75  CALCIUM 9.5  PROT 6.6  BILITOT 1.1  ALKPHOS 71  ALT 15  AST 19  GLUCOSE 98   No results found for this basename: CKTOTAL, CKMB, CKMBINDEX, TROPONINI    Lab Results  Component Value Date   CHOL 163 07/15/2013   CHOL 163 07/06/2012   CHOL 168 07/16/2011   Lab Results  Component Value Date   HDL 55 07/15/2013   HDL 52 07/06/2012   HDL 56 16/06/9603   Lab Results  Component Value Date   LDLCALC 93 07/15/2013   LDLCALC 97 07/06/2012   LDLCALC 80 07/16/2011   Lab Results  Component Value Date   TRIG 73 07/15/2013   TRIG 71 07/06/2012   TRIG  158* 07/16/2011   Lab Results  Component Value Date   CHOLHDL 3.0 07/15/2013   CHOLHDL 3.1 07/06/2012   CHOLHDL 3.0 07/16/2011   No results found for this basename:  LDLDIRECT       EKG: NSR, PVC, IRBBB  ASSESSMENT AND PLAN:  SHOB: Will evaluate BNP to see if there is evidence of fluid overload. Check echocardiogram to evaluate for structural heart disease. This may just be progression of his lung disease.   Abnormal ECG: PVCs noted. He can feel these when he is still. Do not cause any lightheadedness or syncope. He also has an incomplete right bundle branch block. I don't think this is causing him any symptoms. No followup needed for the very mild conduction abnormality.  Signed:   Fredric Mare, MD, [facility] 07/20/2013, 1:48 PM

## 2013-08-04 ENCOUNTER — Ambulatory Visit (HOSPITAL_COMMUNITY): Payer: Medicare Other | Attending: Interventional Cardiology | Admitting: Cardiology

## 2013-08-04 DIAGNOSIS — R0609 Other forms of dyspnea: Secondary | ICD-10-CM | POA: Insufficient documentation

## 2013-08-04 DIAGNOSIS — R0602 Shortness of breath: Secondary | ICD-10-CM

## 2013-08-04 DIAGNOSIS — R0989 Other specified symptoms and signs involving the circulatory and respiratory systems: Secondary | ICD-10-CM | POA: Insufficient documentation

## 2013-08-04 DIAGNOSIS — J4489 Other specified chronic obstructive pulmonary disease: Secondary | ICD-10-CM | POA: Insufficient documentation

## 2013-08-04 DIAGNOSIS — Z87891 Personal history of nicotine dependence: Secondary | ICD-10-CM | POA: Insufficient documentation

## 2013-08-04 DIAGNOSIS — I4510 Unspecified right bundle-branch block: Secondary | ICD-10-CM | POA: Insufficient documentation

## 2013-08-04 DIAGNOSIS — J449 Chronic obstructive pulmonary disease, unspecified: Secondary | ICD-10-CM | POA: Insufficient documentation

## 2013-08-04 NOTE — Progress Notes (Signed)
Echo performed. 

## 2013-09-13 ENCOUNTER — Telehealth: Payer: Self-pay

## 2013-09-13 ENCOUNTER — Ambulatory Visit (INDEPENDENT_AMBULATORY_CARE_PROVIDER_SITE_OTHER): Payer: Medicare Other

## 2013-09-13 VITALS — BP 130/78 | HR 90

## 2013-09-13 DIAGNOSIS — I10 Essential (primary) hypertension: Secondary | ICD-10-CM

## 2013-09-13 NOTE — Telephone Encounter (Signed)
Patient came in for his BP check and states his insurance nurse called stating that he should be on an aspirin?  Please advise?

## 2013-09-13 NOTE — Telephone Encounter (Signed)
So the studies go back and forth but he could potentially benefit from an 81 mg aspirin daily but he would have to limit his Meloxicam and Excedrine Migraine which he should not be taking on the same day any way. Take with food

## 2013-09-13 NOTE — Progress Notes (Signed)
   Subjective:    Patient ID: [name], male    DOB: 08-Apr-1946, 68 y.o.   MRN: [id number]  HPI    Review of Systems     Objective:   Physical Exam        Assessment & Plan:  Pt came in for a BP check

## 2013-09-14 NOTE — Telephone Encounter (Signed)
Pt informed and states he only takes the Exedrine Migraine about once a month

## 2013-10-18 ENCOUNTER — Ambulatory Visit (INDEPENDENT_AMBULATORY_CARE_PROVIDER_SITE_OTHER): Payer: Medicare Other | Admitting: Physician Assistant

## 2013-10-18 ENCOUNTER — Encounter: Payer: Self-pay | Admitting: Physician Assistant

## 2013-10-18 VITALS — BP 144/77 | HR 101 | Temp 98.7°F | Resp 18 | Ht 68.0 in | Wt 159.2 lb

## 2013-10-18 DIAGNOSIS — M1990 Unspecified osteoarthritis, unspecified site: Secondary | ICD-10-CM | POA: Insufficient documentation

## 2013-10-18 DIAGNOSIS — J441 Chronic obstructive pulmonary disease with (acute) exacerbation: Secondary | ICD-10-CM

## 2013-10-18 DIAGNOSIS — M129 Arthropathy, unspecified: Secondary | ICD-10-CM

## 2013-10-18 MED ORDER — PREDNISONE 20 MG PO TABS: 40.0000 mg | ORAL_TABLET | Freq: Every day | ORAL | Status: DC

## 2013-10-18 MED ORDER — DICLOFENAC SODIUM 1 % TD GEL: 1.0000 "application " | Freq: Four times a day (QID) | TRANSDERMAL | Status: DC | PRN

## 2013-10-18 MED ORDER — TRAMADOL HCL 50 MG PO TABS: 50.0000 mg | ORAL_TABLET | Freq: Three times a day (TID) | ORAL | Status: DC | PRN

## 2013-10-18 MED ORDER — AZITHROMYCIN 250 MG PO TABS: ORAL_TABLET | ORAL | Status: DC

## 2013-10-18 NOTE — Assessment & Plan Note (Signed)
Refill medications

## 2013-10-18 NOTE — Progress Notes (Signed)
Patient presents to clinic today c/o 2 days of productive cough, mild SOB and wheezing.  Patient also endorses waking with sinus pressure, head congestion and post nasal drip this AM. Patient denies ear pain, tooth pain or pleuritic chest pain.  Patient does endorse some chest tenderness with repetitive coughing.  Patient has diagnosis of COPD (Gold Stage C) and history of lung abscess s/p resection of RML/RLL in 1996.  Patient currently taking Dulera BID, as prescribed.  Has albuterol inhaler but has not used medication.  Patient denies recent travel or sick contact.  Past Medical History  Diagnosis Date  . Allergic rhinitis   . COPD (chronic obstructive pulmonary disease)     FeV1 64%-2007  . GERD (gastroesophageal reflux disease)   . History of lung abscess     bronchiectasis with RMLandRLL ersection -1996- Dr Arlyce Dice  . Hypertension   . Osteoarthritis     hands and knees  . Emphysema   . Impaired vision     glasses  . Back pain 01/12/2013    Current Outpatient Prescriptions on File Prior to Visit  Medication Sig Dispense Refill  . albuterol (PROAIR HFA) 108 (90 BASE) MCG/ACT inhaler Inhale 2 puffs into the lungs every 6 (six) hours as needed.  18 g  5  . amLODipine (NORVASC) 5 MG tablet Take 1 tablet (5 mg total) by mouth daily.  90 tablet  1  . aspirin 81 MG tablet Take 81 mg by mouth daily.      Marland Kitchen aspirin-acetaminophen-caffeine (EXCEDRIN MIGRAINE) 250-250-65 MG per tablet Take 1 tablet by mouth as needed.       . calcium carbonate (TUMS EX) 750 MG chewable tablet Chew 1 tablet by mouth 2 (two) times daily.        . fluticasone (FLONASE) 50 MCG/ACT nasal spray Place 2 sprays into the nose daily.  16 g  12  . glucosamine-chondroitin 500-400 MG tablet Take 1 tablet by mouth 2 (two) times daily.        Marland Kitchen guaiFENesin (MUCINEX) 600 MG 12 hr tablet Take 600 mg by mouth 2 (two) times daily.        Marland Kitchen HYDROcodone-acetaminophen (NORCO/VICODIN) 5-325 MG per tablet Take 1 tablet by mouth every 6  (six) hours as needed for pain.  20 tablet  0  . loratadine (CLARITIN) 10 MG tablet Take 10 mg by mouth daily.      Marland Kitchen losartan (COZAAR) 100 MG tablet Take 0.5 tablets (50 mg total) by mouth daily.  90 tablet  1  . meloxicam (MOBIC) 15 MG tablet Take 1 tablet (15 mg total) by mouth daily as needed.  90 tablet  1  . mometasone-formoterol (DULERA) 200-5 MCG/ACT AERO Inhale 2 puffs into the lungs 2 (two) times daily.  1 Inhaler  0  . montelukast (SINGULAIR) 10 MG tablet Take 1 tablet (10 mg total) by mouth daily.  30 tablet  5  . Multiple Vitamin (MULTIVITAMIN) tablet Take 1 tablet by mouth 3 (three) times a week.         No current facility-administered medications on file prior to visit.    No Known Allergies  Family History  Problem Relation Age of Onset  . Prostate cancer Father     father died prostate ca  . Coronary artery disease      1st degree relative<60  . Stroke      1st degree relative<50  . Heart failure Mother     age 55  . Colon cancer Paternal  Grandmother     History   Social History  . Marital Status: Married    Spouse Name: N/A    Number of Children: N/A  . Years of Education: N/A   Occupational History  . retired Music therapist    Social History Main Topics  . Smoking status: Former Smoker -- 1.50 packs/day for 38 years    Types: Cigarettes    Quit date: 09/03/2000  . Smokeless tobacco: Never Used  . Alcohol Use: 1.2 - 1.8 oz/week    2-3 Cans of beer per week  . Drug Use: No  . Sexual Activity: No   Other Topics Concern  . None   Social History Narrative   Retired Music therapist   Patient states former smoker. 1 1/2 ppd x 38 yrs  Quit in Jan 2002   Married - 2 weeks (4th marriage)   divorced,  remarried 84-2004 (lost wife to lung ca),  remarried (divorced),    1 son  - 61 [name])   Alcohol use-yes (2-3 beers per week)            Review of Systems - See HPI.  All other ROS are negative.  BP 144/77  Pulse 101  Temp(Src) 98.7  F (37.1 C) (Oral)  Resp 18  Ht 5\' 8"  (1.727 m)  Wt 159 lb 4 oz (72.235 kg)  BMI 24.22 kg/m2  SpO2 95%  Physical Exam  Vitals reviewed. Constitutional: He is oriented to person, place, and time and well-developed, well-nourished, and in no distress.  HENT:  Head: Normocephalic and atraumatic.  Right Ear: Tympanic membrane, external ear and ear canal normal.  Left Ear: Tympanic membrane, external ear and ear canal normal.  Nose: Mucosal edema present.  Mouth/Throat: Uvula is midline, oropharynx is clear and moist and mucous membranes are normal. No oropharyngeal exudate, posterior oropharyngeal edema or posterior oropharyngeal erythema.  Eyes: Conjunctivae are normal. Pupils are equal, round, and reactive to light.  Neck: Neck supple.  Cardiovascular: Normal rate, regular rhythm, normal heart sounds and intact distal pulses.   Pulmonary/Chest: Effort normal. Not tachypneic. He has no decreased breath sounds. He has wheezes. He has no rhonchi. He has no rales. He exhibits no tenderness.  Lymphadenopathy:    He has no cervical adenopathy.  Neurological: He is alert and oriented to person, place, and time.  Skin: Skin is warm and dry. No rash noted.  Psychiatric: Affect normal.    Recent Results (from the past 2160 hour(s))  BRAIN NATRIURETIC PEPTIDE     Status: None   Collection Time    07/20/13  2:03 PM      Result Value Ref Range   Pro B Natriuretic peptide (BNP) 18.0  0.0 - 100.0 pg/mL    Assessment/Plan: COPD with exacerbation Rx prednisone burst.  Rx Azithromycin due to patient's extensive pulmonary history.  Increase fluid intake.  Rest.  Saline nasal spray.  Continue COPD medications as directed.  Humidifier in bedroom.  Probiotic and Multivitamin.  Return to clinic if symptoms are not improving in 48 hours as this will warrant imaging.  Arthritis Refill medications.

## 2013-10-18 NOTE — Assessment & Plan Note (Signed)
Rx prednisone burst.  Rx Azithromycin due to patient's extensive pulmonary history.  Increase fluid intake.  Rest.  Saline nasal spray.  Continue COPD medications as directed.  Humidifier in bedroom.  Probiotic and Multivitamin.  Return to clinic if symptoms are not improving in 48 hours as this will warrant imaging.

## 2013-10-18 NOTE — Progress Notes (Signed)
Pre visit review using our clinic review tool, if applicable. No additional management support is needed unless otherwise documented below in the visit note/SLS  

## 2013-10-18 NOTE — Patient Instructions (Signed)
Please increase fluid intake.  Take antibiotic and prednisone as directed.  Continue allergy medications and asthma inhalers.  Rest.  Use saline nasal spray.  Place a humidifier in the bedroom. OTC medications for cough -- Coricidin HBP is a good option.  Please return to clinic if symptoms are not improving as this will warrant further evaluation and chest x-ray.

## 2013-11-12 ENCOUNTER — Other Ambulatory Visit: Payer: Self-pay | Admitting: Physician Assistant

## 2013-11-23 ENCOUNTER — Ambulatory Visit (INDEPENDENT_AMBULATORY_CARE_PROVIDER_SITE_OTHER): Payer: Medicare Other | Admitting: Critical Care Medicine

## 2013-11-23 ENCOUNTER — Encounter: Payer: Self-pay | Admitting: Critical Care Medicine

## 2013-11-23 VITALS — BP 114/70 | HR 60 | Temp 98.1°F | Ht 68.0 in | Wt 155.0 lb

## 2013-11-23 DIAGNOSIS — J441 Chronic obstructive pulmonary disease with (acute) exacerbation: Secondary | ICD-10-CM

## 2013-11-23 MED ORDER — PREDNISONE 10 MG PO TABS: ORAL_TABLET | ORAL | Status: DC

## 2013-11-23 MED ORDER — BUDESONIDE-FORMOTEROL FUMARATE 160-4.5 MCG/ACT IN AERO: 2.0000 | INHALATION_SPRAY | Freq: Two times a day (BID) | RESPIRATORY_TRACT | Status: DC

## 2013-11-23 MED ORDER — CEFDINIR 300 MG PO CAPS: 600.0000 mg | ORAL_CAPSULE | Freq: Every day | ORAL | Status: DC

## 2013-11-23 NOTE — Progress Notes (Signed)
Subjective:    Patient ID: [name], male    DOB: May 10, 1946, 68 y.o.   MRN: [id number]  HPI  11/23/2013 Chief Complaint  Patient presents with  . 4 month follow up    was seen by PCP office in Feb for breathing trouble.  Has recoved about 80% but still feels weak, having SOB, sweats with exertion, PND, and prod cough with clear to yellow mucus.  Also has nasal congestion with streaks of dark and bright red blood.  No fever.  PCP rx pred level dose and ABX  ?viral. Still nasal congestion and prod cough.  Cough in feb severe, now is less.  Notes blood out of nose.  Still dyspneic with exertion. Pt remains weak. No CXR.  No real fever.  No chest pain   Review of Systems  Constitutional:   No  weight loss, night sweats,  Fevers, chills, fatigue, lassitude. HEENT:   No headaches,  Difficulty swallowing,  Tooth/dental problems, NO Sore throat,                Notes  sneezing, itching, ear ache,notes   +nasal congestion,notes post nasal drip,   CV:  No chest pain,  Orthopnea, PND, swelling in lower extremities, anasarca, dizziness, palpitations  GI  No heartburn, indigestion, abdominal pain, nausea, vomiting, diarrhea, change in bowel habits, loss of appetite  .  No chest wall deformity  Skin: no rash or lesions.  GU: no dysuria, change in color of urine, no urgency or frequency.  No flank pain.  MS:  No joint pain or swelling.  No decreased range of motion.  No back pain.  Psych:  No change in mood or affect. No depression or anxiety.  No memory loss.     Objective:   Physical Exam   Filed Vitals:   11/23/13 0859  BP: 114/70  Pulse: 60  Temp: 98.1 F (36.7 C)  TempSrc: Oral  Height: 5\' 8"  (1.727 m)  Weight: 155 lb (70.308 kg)  SpO2: 97%    Gen: Pleasant, well-nourished, in no distress,  normal affect  ENT: No lesions,  mouth clear,  oropharynx clear, +postnasal drip,  Neck: No JVD, no TMG, no carotid bruits  Lungs: No use of accessory muscles, no dullness to  percussion, expired wheezes  Cardiovascular: RRR, heart sounds normal, no murmur or gallops, no peripheral edema  Abdomen: soft and NT, no HSM,  BS normal  Musculoskeletal: No deformities, no cyanosis or clubbing  Neuro: alert, non focal  Skin: Warm, no lesions or rashes  No results found.       Assessment & Plan:   COPD with exacerbation COPD with acute exacerbation and ongoing Plan Omnicef/cefdinir 600mg  daily for 7days Prednisone 10mg  Take 4 tablets daily for 5 days then stop Sent to downstairs pharmacy Change to symbicort two puff twice daily Stop menthol cough drop Use sugar free candy like jolly rancher, train self to swallow instead of cough or clear throat Return 4 months, sooner if unimproved     Updated Medication List Outpatient Encounter Prescriptions as of 11/23/2013  Medication Sig  . albuterol (PROAIR HFA) 108 (90 BASE) MCG/ACT inhaler Inhale 2 puffs into the lungs every 6 (six) hours as needed.  Marland Kitchen amLODipine (NORVASC) 5 MG tablet one tablet by mouth every day  . aspirin-acetaminophen-caffeine (EXCEDRIN MIGRAINE) 250-250-65 MG per tablet Take 1 tablet by mouth as needed.   . calcium carbonate (TUMS EX) 750 MG chewable tablet Chew 1 tablet by mouth 2 (two)  times daily.    Marland Kitchen Dextromethorphan-Guaifenesin ([place] FAST-MAX DM MAX) 5-100 MG/5ML LIQD Take by mouth as needed.  . diclofenac sodium (VOLTAREN) 1 % GEL Apply 1 application topically 4 (four) times daily as needed.  . fluticasone (FLONASE) 50 MCG/ACT nasal spray Place 2 sprays into the nose daily.  Marland Kitchen glucosamine-chondroitin 500-400 MG tablet Take 1 tablet by mouth 2 (two) times daily.    Marland Kitchen guaiFENesin (MUCINEX) 600 MG 12 hr tablet Take 600 mg by mouth 2 (two) times daily.    Marland Kitchen HYDROcodone-acetaminophen (NORCO/VICODIN) 5-325 MG per tablet Take 1 tablet by mouth every 6 (six) hours as needed for pain.  Marland Kitchen loratadine (CLARITIN) 10 MG tablet Take 10 mg by mouth daily.  Marland Kitchen losartan (COZAAR) 100 MG tablet Take  0.5 tablets (50 mg total) by mouth daily.  . meloxicam (MOBIC) 15 MG tablet Take 1 tablet (15 mg total) by mouth daily as needed.  . Menthol (ROBITUSSIN COUGH DROPS MT) Use as directed in the mouth or throat as needed.  . montelukast (SINGULAIR) 10 MG tablet Take 1 tablet (10 mg total) by mouth daily.  . Multiple Vitamin (MULTIVITAMIN) tablet Take 1 tablet by mouth 3 (three) times a week.    . Pseudoeph-Doxylamine-DM-APAP (NYQUIL PO) Take by mouth as needed.  . traMADol (ULTRAM) 50 MG tablet Take 1 tablet (50 mg total) by mouth every 8 (eight) hours as needed.  . [DISCONTINUED] mometasone-formoterol (DULERA) 200-5 MCG/ACT AERO Inhale 2 puffs into the lungs 2 (two) times daily.  Marland Kitchen aspirin 81 MG tablet On hold  . budesonide-formoterol (SYMBICORT) 160-4.5 MCG/ACT inhaler Inhale 2 puffs into the lungs 2 (two) times daily.  . cefdinir (OMNICEF) 300 MG capsule Take 2 capsules (600 mg total) by mouth daily.  . predniSONE (DELTASONE) 10 MG tablet Take 4 tablets daily for 5 days then stop  . [DISCONTINUED] azithromycin (ZITHROMAX) 250 MG tablet Take 2 tablets on Day 1.  Then take 1 tablet daily.  . [DISCONTINUED] predniSONE (DELTASONE) 20 MG tablet Take 2 tablets (40 mg total) by mouth daily with breakfast.

## 2013-11-23 NOTE — Patient Instructions (Signed)
Omnicef/cefdinir 600mg  daily for 7days Prednisone 10mg  Take 4 tablets daily for 5 days then stop Sent to downstairs pharmacy Change to symbicort two puff twice daily Stop menthol cough drop Use sugar free candy like jolly rancher, train self to swallow instead of cough or clear throat Return 4 months, sooner if unimproved

## 2013-11-24 NOTE — Assessment & Plan Note (Signed)
COPD with acute exacerbation and ongoing Plan Omnicef/cefdinir 600mg  daily for 7days Prednisone 10mg  Take 4 tablets daily for 5 days then stop Sent to downstairs pharmacy Change to symbicort two puff twice daily Stop menthol cough drop Use sugar free candy like jolly rancher, train self to swallow instead of cough or clear throat Return 4 months, sooner if unimproved

## 2013-11-26 ENCOUNTER — Telehealth: Payer: Self-pay

## 2013-11-26 MED ORDER — LOSARTAN POTASSIUM 100 MG PO TABS: 50.0000 mg | ORAL_TABLET | Freq: Every day | ORAL | Status: DC

## 2013-11-26 NOTE — Telephone Encounter (Signed)
Patient left a "rude" message on my vm stating that his pharmacy has sent an RX to [place] twice for Losartan.  I informed pt that we have not received anything from his pharmacy. And that I just sent this in for him

## 2013-11-26 NOTE — Telephone Encounter (Signed)
Message copied by Varney Daily on Fri Nov 26, 2013  3:16 PM ------      Message from: Laren Everts      Created: Fri Nov 26, 2013  2:57 PM       Hey,       This man left a message for you apologizing for being Rude to you yesterday, He contacted his pharmacy and they had the wrong fax number so we had not received a refill request.              :)  ------

## 2014-01-10 ENCOUNTER — Telehealth: Payer: Self-pay | Admitting: Family Medicine

## 2014-01-10 MED ORDER — MELOXICAM 15 MG PO TABS
15.0000 mg | ORAL_TABLET | Freq: Every day | ORAL | Status: DC | PRN
Start: 2014-01-10 — End: 2014-02-17

## 2014-01-10 NOTE — Telephone Encounter (Signed)
Please inform pt that I sent in a 90 day supply of Meloxicam but he will need an appt for additional refills. Hasn't seen blyth since 8-14

## 2014-01-10 NOTE — Telephone Encounter (Signed)
Refill- meloxicam ° °[facility] in [place], [place] °

## 2014-01-11 NOTE — Telephone Encounter (Signed)
Informed patient of medication refill and he scheduled appointment for mid june

## 2014-01-12 ENCOUNTER — Telehealth: Payer: Self-pay | Admitting: Family Medicine

## 2014-01-12 NOTE — Telephone Encounter (Signed)
Received paperwork for PA on Voltaren, forward to nurse

## 2014-01-17 NOTE — Telephone Encounter (Signed)
Pa has been approved for voltaren gel through 01-18-15

## 2014-02-09 ENCOUNTER — Other Ambulatory Visit: Payer: Self-pay | Admitting: Physician Assistant

## 2014-02-10 ENCOUNTER — Other Ambulatory Visit: Payer: Self-pay

## 2014-02-10 MED ORDER — MONTELUKAST SODIUM 10 MG PO TABS: 10.0000 mg | ORAL_TABLET | Freq: Every day | ORAL | Status: DC

## 2014-02-10 NOTE — Telephone Encounter (Signed)
Pt would like singulair sent to pharmacy

## 2014-02-10 NOTE — Telephone Encounter (Signed)
Rx request to pharmacy/SLS  

## 2014-02-14 ENCOUNTER — Telehealth: Payer: Self-pay | Admitting: Family Medicine

## 2014-02-14 NOTE — Telephone Encounter (Signed)
Refill- meloxicam  [facility] in [place], [place]

## 2014-02-17 ENCOUNTER — Encounter: Payer: Self-pay | Admitting: Family Medicine

## 2014-02-17 ENCOUNTER — Ambulatory Visit (INDEPENDENT_AMBULATORY_CARE_PROVIDER_SITE_OTHER): Payer: Medicare Other | Admitting: Family Medicine

## 2014-02-17 VITALS — BP 130/68 | HR 80 | Temp 98.2°F | Ht 68.0 in | Wt 152.1 lb

## 2014-02-17 DIAGNOSIS — Z9109 Other allergy status, other than to drugs and biological substances: Secondary | ICD-10-CM

## 2014-02-17 DIAGNOSIS — I10 Essential (primary) hypertension: Secondary | ICD-10-CM

## 2014-02-17 DIAGNOSIS — M1990 Unspecified osteoarthritis, unspecified site: Secondary | ICD-10-CM

## 2014-02-17 DIAGNOSIS — J329 Chronic sinusitis, unspecified: Secondary | ICD-10-CM

## 2014-02-17 DIAGNOSIS — J309 Allergic rhinitis, unspecified: Secondary | ICD-10-CM

## 2014-02-17 DIAGNOSIS — Z23 Encounter for immunization: Secondary | ICD-10-CM

## 2014-02-17 DIAGNOSIS — J449 Chronic obstructive pulmonary disease, unspecified: Secondary | ICD-10-CM

## 2014-02-17 DIAGNOSIS — M129 Arthropathy, unspecified: Secondary | ICD-10-CM

## 2014-02-17 MED ORDER — MONTELUKAST SODIUM 10 MG PO TABS: 10.0000 mg | ORAL_TABLET | Freq: Every day | ORAL | Status: DC

## 2014-02-17 MED ORDER — MELOXICAM 15 MG PO TABS: 15.0000 mg | ORAL_TABLET | Freq: Every day | ORAL | Status: DC | PRN

## 2014-02-17 MED ORDER — AMLODIPINE BESYLATE 5 MG PO TABS: 5.0000 mg | ORAL_TABLET | Freq: Every day | ORAL | Status: DC

## 2014-02-17 MED ORDER — TRAMADOL HCL 50 MG PO TABS: 50.0000 mg | ORAL_TABLET | Freq: Three times a day (TID) | ORAL | Status: DC | PRN

## 2014-02-17 NOTE — Patient Instructions (Signed)
Hypertension Hypertension, commonly called high blood pressure, is when the force of blood pumping through your arteries is too strong. Your arteries are the blood vessels that carry blood from your heart throughout your body. A blood pressure reading consists of a higher number over a lower number, such as 110/72. The higher number (systolic) is the pressure inside your arteries when your heart pumps. The lower number (diastolic) is the pressure inside your arteries when your heart relaxes. Ideally you want your blood pressure below 120/80. Hypertension forces your heart to work harder to pump blood. Your arteries may become narrow or stiff. Having hypertension puts you at risk for heart disease, stroke, and other problems.  RISK FACTORS Some risk factors for high blood pressure are controllable. Others are not.  Risk factors you cannot control include:   Race. You may be at higher risk if you are African American.  Age. Risk increases with age.  Gender. Men are at higher risk than women before age 45 years. After age 65, women are at higher risk than men. Risk factors you can control include:  Not getting enough exercise or physical activity.  Being overweight.  Getting too much fat, sugar, calories, or salt in your diet.  Drinking too much alcohol. SIGNS AND SYMPTOMS Hypertension does not usually cause signs or symptoms. Extremely high blood pressure (hypertensive crisis) may cause headache, anxiety, shortness of breath, and nosebleed. DIAGNOSIS  To check if you have hypertension, your health care provider will measure your blood pressure while you are seated, with your arm held at the level of your heart. It should be measured at least twice using the same arm. Certain conditions can cause a difference in blood pressure between your right and left arms. A blood pressure reading that is higher than normal on one occasion does not mean that you need treatment. If one blood pressure reading  is high, ask your health care provider about having it checked again. TREATMENT  Treating high blood pressure includes making lifestyle changes and possibly taking medication. Living a healthy lifestyle can help lower high blood pressure. You may need to change some of your habits. Lifestyle changes may include:  Following the DASH diet. This diet is high in fruits, vegetables, and whole grains. It is low in salt, red meat, and added sugars.  Getting at least 2 1/2 hours of brisk physical activity every week.  Losing weight if necessary.  Not smoking.  Limiting alcoholic beverages.  Learning ways to reduce stress. If lifestyle changes are not enough to get your blood pressure under control, your health care provider may prescribe medicine. You may need to take more than one. Work closely with your health care provider to understand the risks and benefits. HOME CARE INSTRUCTIONS  Have your blood pressure rechecked as directed by your health care provider.   Only take medicine as directed by your health care provider. Follow the directions carefully. Blood pressure medicines must be taken as prescribed. The medicine does not work as well when you skip doses. Skipping doses also puts you at risk for problems.   Do not smoke.   Monitor your blood pressure at home as directed by your health care provider. SEEK MEDICAL CARE IF:   You think you are having a reaction to medicines taken.  You have recurrent headaches or feel dizzy.  You have swelling in your ankles.  You have trouble with your vision. SEEK IMMEDIATE MEDICAL CARE IF:  You develop a severe headache or   confusion.  You have unusual weakness, numbness, or feel faint.  You have severe chest or abdominal pain.  You vomit repeatedly.  You have trouble breathing. MAKE SURE YOU:   Understand these instructions.  Will watch your condition.  Will get help right away if you are not doing well or get  worse. Document Released: 08/19/2005 Document Revised: 08/24/2013 Document Reviewed: 06/11/2013 Exit[employer] Patient Information 2015 [employer]. This information is not intended to replace advice given to you by your health care provider. Make sure you discuss any questions you have with your health care provider.  

## 2014-02-17 NOTE — Progress Notes (Signed)
Pre visit review using our clinic review tool, if applicable. No additional management support is needed unless otherwise documented below in the visit note. 

## 2014-02-18 ENCOUNTER — Telehealth: Payer: Self-pay | Admitting: Family Medicine

## 2014-02-18 NOTE — Telephone Encounter (Signed)
Relevant patient education assigned to patient using Emmi. ° °

## 2014-02-27 ENCOUNTER — Encounter: Payer: Self-pay | Admitting: Family Medicine

## 2014-02-27 NOTE — Assessment & Plan Note (Signed)
Was sick quite a bit in February and March but is feeling better now. Encouraged daily probiotic

## 2014-02-27 NOTE — Assessment & Plan Note (Signed)
Hands and shoulders are the worst, stay as active as possible and try Tylenol and topical treatments prn

## 2014-02-27 NOTE — Assessment & Plan Note (Signed)
Well controlled, no changes to meds. Encouraged heart healthy diet such as the DASH diet and exercise as tolerated.  °

## 2014-02-27 NOTE — Progress Notes (Signed)
Patient ID: [name], male   DOB: 01/02/1946, 68 y.o.   MRN: [id number] [name] [id number] 1945-12-04 02/27/2014      Progress Note-Follow Up  Subjective  Chief Complaint  Chief Complaint  Patient presents with  . Medication Refill  . Injections    tdap    HPI  Patient is a 68 year old male in today for routine medical care. He is in today for followup. He struggled with significant coughing congestion for several months during the winter and early spring but is feeling better. Does still have a cough but it is less frequent and less intense. He has some mucus mostly clear and very rarely yellow. No fevers or chills. No chest pain or palpitations. His arthritis has been flared in his hands and shoulders. No recent falls or injuries. No recent acute concerns. Denies CP/palp/SOB/HA/congestion/fevers/GI or GU c/o. Taking meds as prescribed  Past Medical History  Diagnosis Date  . Allergic rhinitis   . COPD (chronic obstructive pulmonary disease)     FeV1 64%-2007  . GERD (gastroesophageal reflux disease)   . History of lung abscess     bronchiectasis with RMLandRLL ersection -1996- Dr Arlyce Dice  . Hypertension   . Osteoarthritis     hands and knees  . Emphysema   . Impaired vision     glasses  . Back pain 01/12/2013    Past Surgical History  Procedure Laterality Date  . Lung surgery      RML andRUL removed due to bleeding and bronchiectasis and lung abcess  . Neck surgery  05/2008  . Lower back surgery  09/2008    06/2009  . Hernia repair  10/2009  . Left knee    . Right foot surgery      Family History  Problem Relation Age of Onset  . Prostate cancer Father     father died prostate ca  . Coronary artery disease      1st degree relative<60  . Stroke      1st degree relative<50  . Heart failure Mother     age 43  . Colon cancer Paternal Grandmother     History   Social History  . Marital Status: Married    Spouse Name: N/A    Number of Children: N/A   . Years of Education: N/A   Occupational History  . retired Music therapist    Social History Main Topics  . Smoking status: Former Smoker -- 1.50 packs/day for 38 years    Types: Cigarettes    Quit date: 09/03/2000  . Smokeless tobacco: Never Used  . Alcohol Use: 1.2 - 1.8 oz/week    2-3 Cans of beer per week  . Drug Use: No  . Sexual Activity: No   Other Topics Concern  . Not on [name]   Social History Narrative   Retired Music therapist   Patient states former smoker. 1 1/2 ppd x 38 yrs  Quit in Jan 2002   Married - 2 weeks (4th marriage)   divorced,  remarried 84-2004 (lost wife to lung ca),  remarried (divorced),    1 son  - 72 [name])   Alcohol use-yes (2-3 beers per week)            Current Outpatient Prescriptions on [name] Prior to Visit  Medication Sig Dispense Refill  . albuterol (PROAIR HFA) 108 (90 BASE) MCG/ACT inhaler Inhale 2 puffs into the lungs every 6 (six) hours as needed.  [name]  g  5  . aspirin 81 MG tablet On hold      . aspirin-acetaminophen-caffeine (EXCEDRIN MIGRAINE) 250-250-65 MG per tablet Take 1 tablet by mouth as needed.       . budesonide-formoterol (SYMBICORT) 160-4.5 MCG/ACT inhaler Inhale 2 puffs into the lungs 2 (two) times daily.  1 Inhaler  12  . calcium carbonate (TUMS EX) 750 MG chewable tablet Chew 1 tablet by mouth 2 (two) times daily.        Marland Kitchen Dextromethorphan-Guaifenesin ([name]) 5-100 MG/5ML LIQD Take by mouth as needed.      . diclofenac sodium (VOLTAREN) 1 % GEL Apply 1 application topically 4 (four) times daily as needed.  1 Tube  2  . fluticasone (FLONASE) 50 MCG/ACT nasal spray Place 2 sprays into the nose daily.  16 g  12  . glucosamine-chondroitin 500-400 MG tablet Take 1 tablet by mouth 2 (two) times daily.        Marland Kitchen guaiFENesin (MUCINEX) 600 MG 12 hr tablet Take 600 mg by mouth 2 (two) times daily.        Marland Kitchen loratadine (CLARITIN) 10 MG tablet Take 10 mg by mouth daily.      Marland Kitchen losartan (COZAAR) 100 MG  tablet Take 0.5 tablets (50 mg total) by mouth daily.  90 tablet  1  . Menthol (ROBITUSSIN COUGH DROPS MT) Use as directed in the mouth or throat as needed.      . Multiple Vitamin (MULTIVITAMIN) tablet Take 1 tablet by mouth 3 (three) times a week.        . Pseudoeph-Doxylamine-DM-APAP (NYQUIL PO) Take by mouth as needed.       No current facility-administered medications on [name] prior to visit.    No Known Allergies  Review of Systems  Review of Systems  Constitutional: Negative for fever and malaise/fatigue.  HENT: Positive for congestion.   Eyes: Negative for discharge.  Respiratory: Negative for shortness of breath.   Cardiovascular: Negative for chest pain, palpitations and leg swelling.  Gastrointestinal: Negative for nausea, abdominal pain and diarrhea.  Genitourinary: Negative for dysuria.  Musculoskeletal: Negative for falls.  Skin: Negative for rash.  Neurological: Negative for loss of consciousness and headaches.  Endo/Heme/Allergies: Negative for polydipsia.  Psychiatric/Behavioral: Negative for depression and suicidal ideas. The patient is not nervous/anxious and does not have insomnia.     Objective  BP 130/68  Pulse 80  Temp(Src) 98.2 F (36.8 C) (Oral)  Ht 5\' 8"  (1.727 m)  Wt 152 lb 1.3 oz (68.983 kg)  BMI 23.13 kg/m2  SpO2 97%  Physical Exam  Physical Exam  Constitutional: He is oriented to person, place, and time and well-developed, well-nourished, and in no distress. No distress.  HENT:  Head: Normocephalic and atraumatic.  Eyes: Conjunctivae are normal.  Neck: Neck supple. No thyromegaly present.  Cardiovascular: Normal rate, regular rhythm and normal heart sounds.   No murmur heard. Pulmonary/Chest: Effort normal and breath sounds normal. No respiratory distress.  Abdominal: He exhibits no distension and no mass. There is no tenderness.  Musculoskeletal: He exhibits no edema.  Neurological: He is alert and oriented to person, place, and time.   Skin: Skin is warm.  Psychiatric: Memory, affect and judgment normal.    Lab Results  Component Value Date   TSH 0.902 07/15/2013   Lab Results  Component Value Date   WBC 5.2 07/15/2013   HGB 14.4 07/15/2013   HCT 41.4 07/15/2013   MCV 88.8 07/15/2013   PLT  272 07/15/2013   Lab Results  Component Value Date   CREATININE 0.75 07/15/2013   BUN 23 07/15/2013   NA 138 07/15/2013   K 4.3 07/15/2013   CL 104 07/15/2013   CO2 25 07/15/2013   Lab Results  Component Value Date   ALT 15 07/15/2013   AST 19 07/15/2013   ALKPHOS 71 07/15/2013   BILITOT 1.1 07/15/2013   Lab Results  Component Value Date   CHOL 163 07/15/2013   Lab Results  Component Value Date   HDL 55 07/15/2013   Lab Results  Component Value Date   LDLCALC 93 07/15/2013   Lab Results  Component Value Date   TRIG 73 07/15/2013   Lab Results  Component Value Date   CHOLHDL 3.0 07/15/2013     Assessment & Plan  HYPERTENSION Well controlled, no changes to meds. Encouraged heart healthy diet such as the DASH diet and exercise as tolerated.   SINUSITIS, CHRONIC Was sick quite a bit in February and March but is feeling better now. Encouraged daily probiotic   ALLERGIC RHINITIS May continue Loratatdine and Flonase prn  COPD gold stage C. Following with pulmonology doing well at this time  Arthritis Hands and shoulders are the worst, stay as active as possible and try Tylenol and topical treatments prn

## 2014-02-27 NOTE — Assessment & Plan Note (Signed)
Following with pulmonology doing well at this time

## 2014-02-27 NOTE — Assessment & Plan Note (Signed)
May continue Loratatdine and Flonase prn

## 2014-03-11 ENCOUNTER — Other Ambulatory Visit: Payer: Self-pay | Admitting: Family Medicine

## 2014-03-17 ENCOUNTER — Telehealth: Payer: Self-pay | Admitting: Family Medicine

## 2014-03-17 DIAGNOSIS — J309 Allergic rhinitis, unspecified: Secondary | ICD-10-CM

## 2014-03-17 MED ORDER — FLUTICASONE PROPIONATE 50 MCG/ACT NA SUSP: 2.0000 | Freq: Every day | NASAL | Status: DC

## 2014-03-17 NOTE — Telephone Encounter (Signed)
Refill sent.

## 2014-03-17 NOTE — Telephone Encounter (Signed)
Refill- fluticasone  [facility]

## 2014-07-11 ENCOUNTER — Other Ambulatory Visit: Payer: Self-pay | Admitting: Family Medicine

## 2014-07-20 ENCOUNTER — Telehealth: Payer: Self-pay | Admitting: Critical Care Medicine

## 2014-07-20 ENCOUNTER — Telehealth: Payer: Self-pay | Admitting: Family Medicine

## 2014-07-20 DIAGNOSIS — M1990 Unspecified osteoarthritis, unspecified site: Secondary | ICD-10-CM

## 2014-07-20 MED ORDER — ALBUTEROL SULFATE HFA 108 (90 BASE) MCG/ACT IN AERS: 2.0000 | INHALATION_SPRAY | Freq: Four times a day (QID) | RESPIRATORY_TRACT | Status: DC | PRN

## 2014-07-20 MED ORDER — DICLOFENAC SODIUM 1 % TD GEL: 1.0000 "application " | Freq: Four times a day (QID) | TRANSDERMAL | Status: DC | PRN

## 2014-07-20 NOTE — Telephone Encounter (Signed)
rx sent to [facility] per pt request.

## 2014-07-20 NOTE — Telephone Encounter (Signed)
Caller name: al Relation to pt: self Call back number: [phone] Pharmacy: [facility]  Reason for call:   Patient stating that he only wants a 30 day supply of albuterol and voltarin gel sent to [facility]

## 2014-07-20 NOTE — Telephone Encounter (Signed)
Spoke with patient-- changing medicare coverage to [facility] as of January 2016  As of Jan 2016 Symbicort (90 day) and Ventolin (30 day) will need to go to [employer]. Pt states that a fax will be sent over pertaining all this information and also a fax for refills will be sent to our office to start new service with [facility]. Pt aware to contact our office as time draws closer to Sep 02, 2014 to give a "reminder" of this change and refill.  Nothing further needed.

## 2014-07-21 ENCOUNTER — Other Ambulatory Visit: Payer: Self-pay

## 2014-07-21 DIAGNOSIS — M1990 Unspecified osteoarthritis, unspecified site: Secondary | ICD-10-CM

## 2014-07-21 DIAGNOSIS — I10 Essential (primary) hypertension: Secondary | ICD-10-CM

## 2014-07-21 MED ORDER — AMLODIPINE BESYLATE 5 MG PO TABS: 5.0000 mg | ORAL_TABLET | Freq: Every day | ORAL | Status: DC

## 2014-07-21 MED ORDER — ALBUTEROL SULFATE HFA 108 (90 BASE) MCG/ACT IN AERS: 2.0000 | INHALATION_SPRAY | Freq: Four times a day (QID) | RESPIRATORY_TRACT | Status: DC | PRN

## 2014-07-21 MED ORDER — LOSARTAN POTASSIUM 100 MG PO TABS: 50.0000 mg | ORAL_TABLET | Freq: Every day | ORAL | Status: DC

## 2014-07-21 MED ORDER — MONTELUKAST SODIUM 10 MG PO TABS: 10.0000 mg | ORAL_TABLET | Freq: Every day | ORAL | Status: DC

## 2014-07-21 MED ORDER — MELOXICAM 15 MG PO TABS: 15.0000 mg | ORAL_TABLET | Freq: Every day | ORAL | Status: DC | PRN

## 2014-07-21 MED ORDER — DICLOFENAC SODIUM 1 % TD GEL: 1.0000 "application " | Freq: Four times a day (QID) | TRANSDERMAL | Status: DC | PRN

## 2014-07-21 MED ORDER — BUDESONIDE-FORMOTEROL FUMARATE 160-4.5 MCG/ACT IN AERO: 2.0000 | INHALATION_SPRAY | Freq: Two times a day (BID) | RESPIRATORY_TRACT | Status: DC

## 2014-07-22 ENCOUNTER — Ambulatory Visit (INDEPENDENT_AMBULATORY_CARE_PROVIDER_SITE_OTHER): Payer: Medicare Other | Admitting: Physician Assistant

## 2014-07-22 ENCOUNTER — Other Ambulatory Visit: Payer: Self-pay | Admitting: *Deleted

## 2014-07-22 ENCOUNTER — Encounter: Payer: Self-pay | Admitting: Physician Assistant

## 2014-07-22 VITALS — BP 128/64 | HR 75 | Temp 97.9°F | Resp 16 | Ht 68.0 in | Wt 152.4 lb

## 2014-07-22 DIAGNOSIS — J019 Acute sinusitis, unspecified: Secondary | ICD-10-CM | POA: Diagnosis not present

## 2014-07-22 DIAGNOSIS — Z299 Encounter for prophylactic measures, unspecified: Secondary | ICD-10-CM

## 2014-07-22 DIAGNOSIS — K219 Gastro-esophageal reflux disease without esophagitis: Secondary | ICD-10-CM

## 2014-07-22 DIAGNOSIS — Z136 Encounter for screening for cardiovascular disorders: Secondary | ICD-10-CM

## 2014-07-22 DIAGNOSIS — Z418 Encounter for other procedures for purposes other than remedying health state: Secondary | ICD-10-CM

## 2014-07-22 DIAGNOSIS — Z79899 Other long term (current) drug therapy: Secondary | ICD-10-CM

## 2014-07-22 DIAGNOSIS — J42 Unspecified chronic bronchitis: Secondary | ICD-10-CM

## 2014-07-22 DIAGNOSIS — B9689 Other specified bacterial agents as the cause of diseases classified elsewhere: Secondary | ICD-10-CM

## 2014-07-22 DIAGNOSIS — I10 Essential (primary) hypertension: Secondary | ICD-10-CM

## 2014-07-22 DIAGNOSIS — I493 Ventricular premature depolarization: Secondary | ICD-10-CM

## 2014-07-22 DIAGNOSIS — Z125 Encounter for screening for malignant neoplasm of prostate: Secondary | ICD-10-CM

## 2014-07-22 DIAGNOSIS — Z0000 Encounter for general adult medical examination without abnormal findings: Secondary | ICD-10-CM

## 2014-07-22 LAB — URINALYSIS, ROUTINE W REFLEX MICROSCOPIC
Bilirubin Urine: NEGATIVE
Hgb urine dipstick: NEGATIVE
Ketones, ur: NEGATIVE
Leukocytes, UA: NEGATIVE
Nitrite: NEGATIVE
Specific Gravity, Urine: 1.02 (ref 1.000–1.030)
Total Protein, Urine: NEGATIVE
Urine Glucose: NEGATIVE
Urobilinogen, UA: 0.2 (ref 0.0–1.0)
pH: 7 (ref 5.0–8.0)

## 2014-07-22 LAB — CBC
HCT: 43.9 % (ref 39.0–52.0)
Hemoglobin: 14.3 g/dL (ref 13.0–17.0)
MCHC: 32.6 g/dL (ref 30.0–36.0)
MCV: 93.6 fl (ref 78.0–100.0)
Platelets: 239 10*3/uL (ref 150.0–400.0)
RBC: 4.7 Mil/uL (ref 4.22–5.81)
RDW: 13.4 % (ref 11.5–15.5)
WBC: 6.1 10*3/uL (ref 4.0–10.5)

## 2014-07-22 LAB — HEPATIC FUNCTION PANEL
ALT: 19 U/L (ref 0–53)
AST: 23 U/L (ref 0–37)
Albumin: 3.9 g/dL (ref 3.5–5.2)
Alkaline Phosphatase: 73 U/L (ref 39–117)
Bilirubin, Direct: 0 mg/dL (ref 0.0–0.3)
Total Bilirubin: 1 mg/dL (ref 0.2–1.2)
Total Protein: 7 g/dL (ref 6.0–8.3)

## 2014-07-22 LAB — HEMOGLOBIN A1C: Hgb A1c MFr Bld: 5.7 % (ref 4.6–6.5)

## 2014-07-22 LAB — TSH: TSH: 0.93 u[IU]/mL (ref 0.35–4.50)

## 2014-07-22 MED ORDER — AMOXICILLIN 875 MG PO TABS: 875.0000 mg | ORAL_TABLET | Freq: Two times a day (BID) | ORAL | Status: DC

## 2014-07-22 MED ORDER — BUDESONIDE-FORMOTEROL FUMARATE 160-4.5 MCG/ACT IN AERO: 2.0000 | INHALATION_SPRAY | Freq: Two times a day (BID) | RESPIRATORY_TRACT | Status: DC

## 2014-07-22 MED ORDER — ALBUTEROL SULFATE HFA 108 (90 BASE) MCG/ACT IN AERS: 2.0000 | INHALATION_SPRAY | Freq: Four times a day (QID) | RESPIRATORY_TRACT | Status: DC | PRN

## 2014-07-22 NOTE — Patient Instructions (Addendum)
For Sinus Inflammation -- Please take antibiotic as directed.  Increase fluid intake.  Use Saline nasal spray.  Take a daily multivitamin. Only use plain Mucinex.  Do not use decongestants.  Place a humidifier in the bedroom.  Please call or return clinic if symptoms are not improving.  You will be contacted by Cardiology for a Holter Monitor study.  Stop all caffeine intake until this has been complete.  If you develop any chest pain, please call 911.  Sinusitis Sinusitis is redness, soreness, and swelling (inflammation) of the paranasal sinuses. Paranasal sinuses are air pockets within the bones of your face (beneath the eyes, the middle of the forehead, or above the eyes). In healthy paranasal sinuses, mucus is able to drain out, and air is able to circulate through them by way of your nose. However, when your paranasal sinuses are inflamed, mucus and air can become trapped. This can allow bacteria and other germs to grow and cause infection. Sinusitis can develop quickly and last only a short time (acute) or continue over a long period (chronic). Sinusitis that lasts for more than 12 weeks is considered chronic.  CAUSES  Causes of sinusitis include:  Allergies.  Structural abnormalities, such as displacement of the cartilage that separates your nostrils (deviated septum), which can decrease the air flow through your nose and sinuses and affect sinus drainage.  Functional abnormalities, such as when the small hairs (cilia) that line your sinuses and help remove mucus do not work properly or are not present. SYMPTOMS  Symptoms of acute and chronic sinusitis are the same. The primary symptoms are pain and pressure around the affected sinuses. Other symptoms include:  Upper toothache.  Earache.  Headache.  Bad breath.  Decreased sense of smell and taste.  A cough, which worsens when you are lying flat.  Fatigue.  Fever.  Thick drainage from your nose, which often is green and may  contain pus (purulent).  Swelling and warmth over the affected sinuses. DIAGNOSIS  Your caregiver will perform a physical exam. During the exam, your caregiver may:  Look in your nose for signs of abnormal growths in your nostrils (nasal polyps).  Tap over the affected sinus to check for signs of infection.  View the inside of your sinuses (endoscopy) with a special imaging device with a light attached (endoscope), which is inserted into your sinuses. If your caregiver suspects that you have chronic sinusitis, one or more of the following tests may be recommended:  Allergy tests.  Nasal culture A sample of mucus is taken from your nose and sent to a lab and screened for bacteria.  Nasal cytology A sample of mucus is taken from your nose and examined by your caregiver to determine if your sinusitis is related to an allergy. TREATMENT  Most cases of acute sinusitis are related to a viral infection and will resolve on their own within 10 days. Sometimes medicines are prescribed to help relieve symptoms (pain medicine, decongestants, nasal steroid sprays, or saline sprays).  However, for sinusitis related to a bacterial infection, your caregiver will prescribe antibiotic medicines. These are medicines that will help kill the bacteria causing the infection.  Rarely, sinusitis is caused by a fungal infection. In theses cases, your caregiver will prescribe antifungal medicine. For some cases of chronic sinusitis, surgery is needed. Generally, these are cases in which sinusitis recurs more than 3 times per year, despite other treatments. HOME CARE INSTRUCTIONS   Drink plenty of water. Water helps thin the mucus  so your sinuses can drain more easily.  Use a humidifier.  Inhale steam 3 to 4 times a day (for example, sit in the bathroom with the shower running).  Apply a warm, moist washcloth to your face 3 to 4 times a day, or as directed by your caregiver.  Use saline nasal sprays to help  moisten and clean your sinuses.  Take over-the-counter or prescription medicines for pain, discomfort, or fever only as directed by your caregiver. SEEK IMMEDIATE MEDICAL CARE IF:  You have increasing pain or severe headaches.  You have nausea, vomiting, or drowsiness.  You have swelling around your face.  You have vision problems.  You have a stiff neck.  You have difficulty breathing. MAKE SURE YOU:   Understand these instructions.  Will watch your condition.  Will get help right away if you are not doing well or get worse. Document Released: 08/19/2005 Document Revised: 11/11/2011 Document Reviewed: 09/03/2011 [facility] Patient Information 2014 [place], [place].

## 2014-07-22 NOTE — Progress Notes (Signed)
SUBJECTIVE: Patient presents to clinic today for Annual Medicare Wellness examination and management of other chronic issues. Patient also has acute concerns:  Acute Concerns: Patient complains of 2-3 weeks of sinus pressure, sinus pain, ear fullness, PND and fatigue.  Denies fever, chills, SOB, recent travel or sick contact.  Is taking plain Mucinex with some mild relief in symptoms.  Patient Risk Factors: Hypertension, Age, Male Gender, COPD  Roster of Physicians Providing Medical Care to Patient: Dr. Joya Gaskins -- Pulmonology  Dr. Irish Lack -- Cardiology Dr. Charlett Blake -- Primary Care  Chronic Medical Problems: Hypertension -- Followed by Cardiology.  Endorses BP well controlled with current regimen on Amlodipine and Losartan. BP: 128/64 mmHg.  Denies chest pain, palpitations, SOB, lightheadedness, vision changes or headache.  Currently on 81 mg ASA daily.  Will be getting EKG today.  COPD -- Followed by Pulmonology.  Endorses well controlled on current regimen. Sats are good at RA.  Endorses taking medications as directed.  No recent exacerbation.  GERD -- Endorses well controlled with current regimen.  Denies abdominal pain, nausea or vomiting.  Health Maintenance: Dental -- edentulous Vision -- up-to-date Immunizations -- up-to-date on required immunizations. Colonoscopy -- up-to-date  Activities of Daily Living: In your present state of health, do you have any difficulty performing the following activities? (1) Preparing food and eating?: No  (2) Bathing yourself: No  (3) Getting dressed: No  (4) Using the toilet: No  (5) Moving around from place to place: No  (6) In the past year have you fallen or had a near fall?: No     Home Safety:  Has smoke detector and wears seat belts. No firearms. No excess sun exposure.  Diet and Exercise:  Current exercise habits: walks daily and does stretching exercises daily Dietary issues discussed: Patient has a well-balanced and overall  healthy diet   PHQ-9 Depression Screen:  (Note: if answer to either of the following is "Yes", then a more complete depression screening is indicated)  Q1: Over the past two weeks, have you felt down, depressed or hopeless? no  Q2: Over the past two weeks, have you felt little interest or pleasure in doing things? no   The following portions of the patient's history were reviewed and updated as appropriate: allergies, current medications, past family history, past medical history, past social history, past surgical history and problem list.  OBJECTIVE:  BP 128/64 mmHg  Pulse 75  Temp(Src) 97.9 F (36.6 C) (Oral)  Resp 16  Ht 5\' 8"  (1.727 m)  Wt 152 lb 6 oz (69.117 kg)  BMI 23.17 kg/m2  SpO2 97%  General Appearance:    Alert, cooperative, no distress, appears stated age  Head:    Normocephalic, without obvious abnormality, atraumatic  Eyes:    PERRL, conjunctiva/corneas clear, EOM's intact, fundi    benign, both eyes  Ears:    Normal TM's and external ear canals, both ears;  Nose:   Nares normal, septum midline, mucosa edema noted, + sinus tenderness  Throat:   Lips, mucosa, and tongue normal; teeth and gums normal  Neck:   Supple, symmetrical, trachea midline, no adenopathy;    thyroid:  no enlargement/tenderness/nodules; no carotid   bruit or JVD  Back:     Symmetric, no curvature, ROM normal, no CVA tenderness  Lungs:     Clear to auscultation bilaterally, respirations unlabored  Chest Wall:    No tenderness or deformity   Heart:    Regular rate and rhythm, S1 and  S2 normal, no murmur, rub   or gallop  Abdomen:     Soft, non-tender, bowel sounds active all four quadrants,    no masses, no organomegaly  Genitalia:    Normal without lesion, discharge or tenderness     Extremities:   Extremities normal, atraumatic, no cyanosis or edema  Pulses:   2+ and symmetric all extremities  Skin:   Skin color, texture, turgor normal, no rashes or lesions  Lymph nodes:   Cervical,  supraclavicular, and axillary nodes normal  Neurologic:   CNII-XII intact, normal strength, sensation and reflexes    throughout   Vision: see nursing report. Hearing: able to hear forced whisper at 6 feet Body mass index: Body mass index is 23.17 kg/(m^2). Cognitive Impairment Assessment: cognition, memory and judgment appear normal.   Assessment/Plan: Screening for ischemic heart disease EKG reveals incomplete RBBB (unchanged from prior) and ventricular trigeminy PVCs.  Asymptomatic.  Patient instructed to continue antihypertensive regimen and 81 mg ASA daily.  Will obtain Holter Monitor study.  Prostate cancer screening Defers DRE.  Will obtain PSA today.  Medicare annual wellness visit, subsequent During the course of the visit the patient was educated and counseled about appropriate screening and preventive services including: Fall prevention, Bone densitometry screening, Diabetes screening, Nutrition counseling.  Patient needs prevnar but is sick at today's visit. on required immunizations. Will obtain fasting labs at today's visit.    Patient Instructions (the written plan) was given to the patient.    Essential hypertension Well controlled.  Continue current regimen. DASH diet discussed with patient.  Follow-up 6 months.  Follow-up with Cardiology as scheduled.  Asymptomatic PVCs Will obtain Holter Monitor study -- 48 hours. Continue current regimen.  Follow-up with Cardiology as scheduled.  Cardiology consulted (Dr. Harrington Challenger) to verify patient did not need consultation at present for asymptomatic PVCs.  No need for further assessment besides holter at present.  GERD Well controlled.  Continue current regimen.  Acute bacterial sinusitis Rx Amoxicillin.  Increase fluids.  Rest.  Saline nasal spray.  Probiotic.  Mucinex as directed.  Humidifier in bedroom. Continue COPD medications.  Call or return to clinic if symptoms are not improving.   COPD (chronic obstructive pulmonary  disease) Stable.  Continue current regimen. Follow-up with Pulmonary as scheduled.   A written set of instructions was given to the patient at the end of this visit.

## 2014-07-22 NOTE — Progress Notes (Signed)
Pre visit review using our clinic review tool, if applicable. No additional management support is needed unless otherwise documented below in the visit note. 

## 2014-07-23 LAB — BASIC METABOLIC PANEL WITH GFR
BUN: 26 mg/dL — ABNORMAL HIGH (ref 6–23)
CO2: 24 mEq/L (ref 19–32)
Calcium: 8.8 mg/dL (ref 8.4–10.5)
Chloride: 105 mEq/L (ref 96–112)
Creat: 0.75 mg/dL (ref 0.50–1.35)
GFR, Est African American: >89 mL/min
GFR, Est Non African American: >89 mL/min
Glucose, Bld: 80 mg/dL (ref 70–99)
Potassium: 4.3 mEq/L (ref 3.5–5.3)
Sodium: 140 mEq/L (ref 135–145)

## 2014-07-23 LAB — PSA, TOTAL AND FREE
PSA, Free Pct: 28 % (ref >25)
PSA, Free: 0.61 ng/mL
PSA: 2.21 ng/mL (ref <=4.00)

## 2014-07-23 LAB — LIPID PANEL W/DIRECT LDL/HDL RATIO
Cholesterol: 147 mg/dL (ref 0–200)
Direct LDL: 85 mg/dL
HDL: 48 mg/dL (ref >39)
LDL:HDL Ratio: 1.8 Ratio
Total Chol/HDL Ratio: 3.1 Ratio
Triglycerides: 64 mg/dL (ref <150)

## 2014-07-25 ENCOUNTER — Other Ambulatory Visit: Payer: Self-pay

## 2014-07-25 ENCOUNTER — Other Ambulatory Visit: Payer: Self-pay | Admitting: *Deleted

## 2014-07-25 DIAGNOSIS — M1990 Unspecified osteoarthritis, unspecified site: Secondary | ICD-10-CM

## 2014-07-25 DIAGNOSIS — Z136 Encounter for screening for cardiovascular disorders: Secondary | ICD-10-CM | POA: Insufficient documentation

## 2014-07-25 DIAGNOSIS — I10 Essential (primary) hypertension: Secondary | ICD-10-CM | POA: Insufficient documentation

## 2014-07-25 DIAGNOSIS — J019 Acute sinusitis, unspecified: Principal | ICD-10-CM

## 2014-07-25 DIAGNOSIS — I493 Ventricular premature depolarization: Secondary | ICD-10-CM | POA: Insufficient documentation

## 2014-07-25 DIAGNOSIS — B9689 Other specified bacterial agents as the cause of diseases classified elsewhere: Secondary | ICD-10-CM | POA: Insufficient documentation

## 2014-07-25 DIAGNOSIS — Z125 Encounter for screening for malignant neoplasm of prostate: Secondary | ICD-10-CM | POA: Insufficient documentation

## 2014-07-25 DIAGNOSIS — Z0000 Encounter for general adult medical examination without abnormal findings: Secondary | ICD-10-CM | POA: Insufficient documentation

## 2014-07-25 DIAGNOSIS — J309 Allergic rhinitis, unspecified: Secondary | ICD-10-CM

## 2014-07-25 MED ORDER — FLUTICASONE PROPIONATE 50 MCG/ACT NA SUSP: 2.0000 | Freq: Every day | NASAL | Status: DC

## 2014-07-25 MED ORDER — TRAMADOL HCL 50 MG PO TABS: 50.0000 mg | ORAL_TABLET | Freq: Three times a day (TID) | ORAL | Status: DC | PRN

## 2014-07-25 NOTE — Assessment & Plan Note (Signed)
Stable.  Continue current regimen. Follow-up with Pulmonary as scheduled.

## 2014-07-25 NOTE — Assessment & Plan Note (Signed)
Rx Amoxicillin.  Increase fluids.  Rest.  Saline nasal spray.  Probiotic.  Mucinex as directed.  Humidifier in bedroom. Continue COPD medications.  Call or return to clinic if symptoms are not improving.

## 2014-07-25 NOTE — Telephone Encounter (Signed)
rx printed for md to sign and fax 

## 2014-07-25 NOTE — Assessment & Plan Note (Signed)
Well controlled.  Continue current regimen. DASH diet discussed with patient.  Follow-up 6 months.  Follow-up with Cardiology as scheduled.

## 2014-07-25 NOTE — Assessment & Plan Note (Signed)
EKG reveals incomplete RBBB (unchanged from prior) and ventricular trigeminy PVCs.  Asymptomatic.  Patient instructed to continue antihypertensive regimen and 81 mg ASA daily.  Will obtain Holter Monitor study.

## 2014-07-25 NOTE — Assessment & Plan Note (Signed)
Well-controlled.  Continue current regimen. 

## 2014-07-25 NOTE — Progress Notes (Signed)
[facility] fax request [facility]

## 2014-07-25 NOTE — Assessment & Plan Note (Signed)
Will obtain Holter Monitor study -- 48 hours. Continue current regimen.  Follow-up with Cardiology as scheduled.  Cardiology consulted (Dr. Harrington Challenger) to verify patient did not need consultation at present for asymptomatic PVCs.  No need for further assessment besides holter at present.

## 2014-07-25 NOTE — Assessment & Plan Note (Signed)
Defers DRE.  Will obtain PSA today.

## 2014-07-25 NOTE — Assessment & Plan Note (Signed)
During the course of the visit the patient was educated and counseled about appropriate screening and preventive services including: Fall prevention, Bone densitometry screening, Diabetes screening, Nutrition counseling.  Patient needs prevnar but is sick at today's visit. on required immunizations. Will obtain fasting labs at today's visit.    Patient Instructions (the written plan) was given to the patient.

## 2014-07-26 ENCOUNTER — Other Ambulatory Visit: Payer: Self-pay | Admitting: Physician Assistant

## 2014-07-26 DIAGNOSIS — R799 Abnormal finding of blood chemistry, unspecified: Secondary | ICD-10-CM

## 2014-07-27 ENCOUNTER — Other Ambulatory Visit: Payer: Self-pay | Admitting: Physician Assistant

## 2014-07-27 DIAGNOSIS — I493 Ventricular premature depolarization: Secondary | ICD-10-CM

## 2014-08-03 ENCOUNTER — Encounter: Payer: Self-pay | Admitting: *Deleted

## 2014-08-03 ENCOUNTER — Encounter (INDEPENDENT_AMBULATORY_CARE_PROVIDER_SITE_OTHER): Payer: Medicare Other

## 2014-08-03 DIAGNOSIS — I493 Ventricular premature depolarization: Secondary | ICD-10-CM

## 2014-08-03 NOTE — Progress Notes (Signed)
Patient ID: [name], male   DOB: 1946/02/13, 68 y.o.   MRN: [id number] [facility] 48 hour holter monitor applied to patient.

## 2014-08-05 ENCOUNTER — Other Ambulatory Visit: Payer: Self-pay

## 2014-08-05 DIAGNOSIS — M1990 Unspecified osteoarthritis, unspecified site: Secondary | ICD-10-CM

## 2014-08-05 DIAGNOSIS — I10 Essential (primary) hypertension: Secondary | ICD-10-CM

## 2014-08-05 MED ORDER — AMLODIPINE BESYLATE 5 MG PO TABS: 5.0000 mg | ORAL_TABLET | Freq: Every day | ORAL | Status: DC

## 2014-08-05 MED ORDER — LOSARTAN POTASSIUM 100 MG PO TABS: 50.0000 mg | ORAL_TABLET | Freq: Every day | ORAL | Status: DC

## 2014-08-05 MED ORDER — MELOXICAM 15 MG PO TABS: 15.0000 mg | ORAL_TABLET | Freq: Every day | ORAL | Status: DC | PRN

## 2014-08-05 MED ORDER — MONTELUKAST SODIUM 10 MG PO TABS: 10.0000 mg | ORAL_TABLET | Freq: Every day | ORAL | Status: DC

## 2014-08-05 MED ORDER — DICLOFENAC SODIUM 1 % TD GEL: 1.0000 "application " | Freq: Four times a day (QID) | TRANSDERMAL | Status: DC | PRN

## 2014-08-05 MED ORDER — ALBUTEROL SULFATE HFA 108 (90 BASE) MCG/ACT IN AERS: 2.0000 | INHALATION_SPRAY | Freq: Four times a day (QID) | RESPIRATORY_TRACT | Status: DC | PRN

## 2014-08-05 MED ORDER — BUDESONIDE-FORMOTEROL FUMARATE 160-4.5 MCG/ACT IN AERO: 2.0000 | INHALATION_SPRAY | Freq: Two times a day (BID) | RESPIRATORY_TRACT | Status: DC

## 2014-08-08 ENCOUNTER — Telehealth: Payer: Self-pay | Admitting: Interventional Cardiology

## 2014-08-08 NOTE — Telephone Encounter (Signed)
New message          [facility] is getting ready to send results via email

## 2014-08-08 NOTE — Telephone Encounter (Signed)
Awaiting lab results

## 2014-08-10 ENCOUNTER — Other Ambulatory Visit: Payer: Self-pay | Admitting: *Deleted

## 2014-08-10 ENCOUNTER — Other Ambulatory Visit (INDEPENDENT_AMBULATORY_CARE_PROVIDER_SITE_OTHER): Payer: Medicare Other

## 2014-08-10 DIAGNOSIS — R799 Abnormal finding of blood chemistry, unspecified: Secondary | ICD-10-CM

## 2014-08-10 LAB — BASIC METABOLIC PANEL
BUN: 21 mg/dL (ref 6–23)
CO2: 28 mEq/L (ref 19–32)
Calcium: 9 mg/dL (ref 8.4–10.5)
Chloride: 101 mEq/L (ref 96–112)
Creatinine, Ser: 0.9 mg/dL (ref 0.4–1.5)
GFR: 90.41 mL/min (ref >60.00)
Glucose, Bld: 150 mg/dL — ABNORMAL HIGH (ref 70–99)
Potassium: 4.5 mEq/L (ref 3.5–5.1)
Sodium: 135 mEq/L (ref 135–145)

## 2014-08-10 MED ORDER — ALBUTEROL SULFATE HFA 108 (90 BASE) MCG/ACT IN AERS: 2.0000 | INHALATION_SPRAY | Freq: Four times a day (QID) | RESPIRATORY_TRACT | Status: DC | PRN

## 2014-08-10 MED ORDER — BUDESONIDE-FORMOTEROL FUMARATE 160-4.5 MCG/ACT IN AERO: 2.0000 | INHALATION_SPRAY | Freq: Two times a day (BID) | RESPIRATORY_TRACT | Status: DC

## 2014-08-11 ENCOUNTER — Other Ambulatory Visit: Payer: Self-pay

## 2014-08-11 DIAGNOSIS — M1990 Unspecified osteoarthritis, unspecified site: Secondary | ICD-10-CM

## 2014-08-11 MED ORDER — DICLOFENAC SODIUM 1 % TD GEL: 1.0000 "application " | Freq: Four times a day (QID) | TRANSDERMAL | Status: DC | PRN

## 2014-08-11 NOTE — Telephone Encounter (Signed)
BMP in EPIC from 08/10/14.

## 2014-08-15 ENCOUNTER — Encounter: Payer: Self-pay | Admitting: Physician Assistant

## 2014-08-17 ENCOUNTER — Other Ambulatory Visit: Payer: Self-pay

## 2014-08-17 DIAGNOSIS — M1990 Unspecified osteoarthritis, unspecified site: Secondary | ICD-10-CM

## 2014-08-24 ENCOUNTER — Telehealth: Payer: Self-pay | Admitting: Interventional Cardiology

## 2014-08-24 NOTE — Telephone Encounter (Signed)
The patient is aware of his monitor results. Per Dr. Irish Lack "NSR with PVC's. Short runs of non-sustained VT."  Will give results to monitor room to forward to [name] , [place] with PCP who originally ordered.

## 2014-09-06 NOTE — Telephone Encounter (Signed)
Pt notified of results on 12/23 by cardiology.

## 2014-11-01 ENCOUNTER — Other Ambulatory Visit: Payer: Self-pay | Admitting: Physician Assistant

## 2014-11-03 ENCOUNTER — Other Ambulatory Visit: Payer: Self-pay | Admitting: Family Medicine

## 2014-11-07 ENCOUNTER — Encounter: Payer: Self-pay | Admitting: Physician Assistant

## 2014-11-07 ENCOUNTER — Ambulatory Visit (INDEPENDENT_AMBULATORY_CARE_PROVIDER_SITE_OTHER): Payer: Commercial Managed Care - HMO | Admitting: Physician Assistant

## 2014-11-07 VITALS — BP 150/68 | HR 83 | Temp 97.6°F | Resp 18 | Ht 68.0 in | Wt 153.2 lb

## 2014-11-07 DIAGNOSIS — B9689 Other specified bacterial agents as the cause of diseases classified elsewhere: Secondary | ICD-10-CM

## 2014-11-07 DIAGNOSIS — I10 Essential (primary) hypertension: Secondary | ICD-10-CM | POA: Diagnosis not present

## 2014-11-07 DIAGNOSIS — J019 Acute sinusitis, unspecified: Secondary | ICD-10-CM

## 2014-11-07 DIAGNOSIS — M1990 Unspecified osteoarthritis, unspecified site: Secondary | ICD-10-CM

## 2014-11-07 MED ORDER — AMOXICILLIN-POT CLAVULANATE 875-125 MG PO TABS: 1.0000 | ORAL_TABLET | Freq: Two times a day (BID) | ORAL | Status: DC

## 2014-11-07 MED ORDER — LOSARTAN POTASSIUM 100 MG PO TABS: 50.0000 mg | ORAL_TABLET | Freq: Every day | ORAL | Status: DC

## 2014-11-07 MED ORDER — MONTELUKAST SODIUM 10 MG PO TABS: 10.0000 mg | ORAL_TABLET | Freq: Every day | ORAL | Status: DC

## 2014-11-07 MED ORDER — MELOXICAM 15 MG PO TABS: 15.0000 mg | ORAL_TABLET | Freq: Every day | ORAL | Status: DC | PRN

## 2014-11-07 MED ORDER — HYDROCOD POLST-CHLORPHEN POLST 10-8 MG/5ML PO LQCR: 5.0000 mL | Freq: Two times a day (BID) | ORAL | Status: DC | PRN

## 2014-11-07 MED ORDER — AMLODIPINE BESYLATE 5 MG PO TABS: 5.0000 mg | ORAL_TABLET | Freq: Every day | ORAL | Status: DC

## 2014-11-07 NOTE — Progress Notes (Signed)
Patient presents to clinic today c/o sinus pressure, sinus pain, facial pain and cough that is productive of dark green sputum x 3 weeks.  Denies chest pain or SOB. Denies fever, recent travel or sick contact.  Taken [place] for symptoms without major relief in symptoms.  Past Medical History  Diagnosis Date  . Allergic rhinitis   . COPD (chronic obstructive pulmonary disease)     FeV1 64%-2007  . GERD (gastroesophageal reflux disease)   . History of lung abscess     bronchiectasis with RMLandRLL ersection -1996- Dr Arlyce Dice  . Hypertension   . Osteoarthritis     hands and knees  . Emphysema   . Impaired vision     glasses  . Back pain 01/12/2013    Current Outpatient Prescriptions on File Prior to Visit  Medication Sig Dispense Refill  . albuterol (PROVENTIL HFA;VENTOLIN HFA) 108 (90 BASE) MCG/ACT inhaler Inhale 2 puffs into the lungs every 6 (six) hours as needed for wheezing or shortness of breath. 3 Inhaler 3  . amLODipine (NORVASC) 5 MG tablet Take 1 tablet (5 mg total) by mouth daily. 90 tablet 0  . aspirin 81 MG tablet On hold    . aspirin-acetaminophen-caffeine (EXCEDRIN MIGRAINE) 250-250-65 MG per tablet Take 1 tablet by mouth as needed.     . budesonide-formoterol (SYMBICORT) 160-4.5 MCG/ACT inhaler Inhale 2 puffs into the lungs 2 (two) times daily. 3 Inhaler 3  . calcium carbonate (TUMS EX) 750 MG chewable tablet Chew 1 tablet by mouth 2 (two) times daily.      . diclofenac sodium (VOLTAREN) 1 % GEL Apply 1 application topically 4 (four) times daily as needed. 3 Tube 0  . fluticasone (FLONASE) 50 MCG/ACT nasal spray USE 2 SPRAYS IN EACH NOSTRIL ONE TIME DAILY 48 g 2  . glucosamine-chondroitin 500-400 MG tablet Take 1 tablet by mouth 2 (two) times daily.      Marland Kitchen guaiFENesin (MUCINEX) 600 MG 12 hr tablet Take 600 mg by mouth 2 (two) times daily.      Marland Kitchen loratadine (CLARITIN) 10 MG tablet Take 10 mg by mouth daily.    Marland Kitchen losartan (COZAAR) 100 MG tablet Take 0.5 tablets (50  mg total) by mouth daily. 90 tablet 0  . meloxicam (MOBIC) 15 MG tablet Take 1 tablet (15 mg total) by mouth daily as needed for pain (with food). 90 tablet 1  . Menthol (ROBITUSSIN COUGH DROPS MT) Use as directed in the mouth or throat as needed.    . montelukast (SINGULAIR) 10 MG tablet Take 1 tablet (10 mg total) by mouth daily. 90 tablet 0  . Multiple Vitamin (MULTIVITAMIN) tablet Take 1 tablet by mouth 3 (three) times a week.      . Pseudoeph-Doxylamine-DM-APAP (NYQUIL PO) Take by mouth as needed.    . traMADol (ULTRAM) 50 MG tablet Take 1 tablet (50 mg total) by mouth every 8 (eight) hours as needed. 40 tablet 0   No current facility-administered medications on file prior to visit.    No Known Allergies  Family History  Problem Relation Age of Onset  . Prostate cancer Father     father died prostate ca  . Coronary artery disease      1st degree relative<60  . Stroke      1st degree relative<50  . Heart failure Mother     age 33  . Colon cancer Paternal Grandmother     History   Social History  . Marital Status: Married  Spouse Name: N/A  . Number of Children: N/A  . Years of Education: N/A   Occupational History  . retired Music therapist    Social History Main Topics  . Smoking status: Former Smoker -- 1.50 packs/day for 38 years    Types: Cigarettes    Quit date: 09/03/2000  . Smokeless tobacco: Never Used  . Alcohol Use: 1.2 - 1.8 oz/week    2-3 Cans of beer per week  . Drug Use: No  . Sexual Activity: No   Other Topics Concern  . None   Social History Narrative   Retired Music therapist   Patient states former smoker. 1 1/2 ppd x 38 yrs  Quit in Jan 2002   Married - 2 weeks (4th marriage)   divorced,  remarried 84-2004 (lost wife to lung ca),  remarried (divorced),    1 son  - 55 [name])   Alcohol use-yes (2-3 beers per week)           Review of Systems - See HPI.  All other ROS are negative.  BP 150/68 mmHg  Pulse 83  Temp(Src)  97.6 F (36.4 C) (Oral)  Resp 18  Ht 5\' 8"  (1.727 m)  Wt 153 lb 3.2 oz (69.491 kg)  BMI 23.30 kg/m2  SpO2 95%  Physical Exam  Constitutional: He is oriented to person, place, and time and well-developed, well-nourished, and in no distress.  HENT:  Head: Normocephalic and atraumatic.  Right Ear: Tympanic membrane, external ear and ear canal normal.  Left Ear: Tympanic membrane and external ear normal.  Nose: Mucosal edema and rhinorrhea present. Right sinus exhibits frontal sinus tenderness. Right sinus exhibits no maxillary sinus tenderness. Left sinus exhibits maxillary sinus tenderness and frontal sinus tenderness.  Mouth/Throat: Uvula is midline, oropharynx is clear and moist and mucous membranes are normal.  Eyes: Conjunctivae are normal.  Neck: No thyromegaly present.  Cardiovascular: Normal rate, regular rhythm, normal heart sounds and intact distal pulses.   Pulmonary/Chest: Effort normal and breath sounds normal. No respiratory distress. He has no wheezes. He has no rales. He exhibits no tenderness.  Lymphadenopathy:    He has no cervical adenopathy.  Neurological: He is alert and oriented to person, place, and time.  Skin: Skin is warm and dry. No rash noted.  Psychiatric: Affect normal.  Vitals reviewed.  Recent Results (from the past 2160 hour(s))  Basic metabolic panel     Status: Abnormal   Collection Time: 08/10/14  9:12 AM  Result Value Ref Range   Sodium 135 135 - 145 mEq/L   Potassium 4.5 3.5 - 5.1 mEq/L   Chloride 101 96 - 112 mEq/L   CO2 28 19 - 32 mEq/L   Glucose, Bld 150 (H) 70 - 99 mg/dL   BUN 21 6 - 23 mg/dL   Creatinine, Ser 0.9 0.4 - 1.5 mg/dL   Calcium 9.0 8.4 - 10.5 mg/dL   GFR 90.41 >60.00 mL/min    Assessment/Plan: Acute bacterial sinusitis Rx Augmentin.  Increase fluids.  Rest.  Saline nasal spray.  Probiotic.  Mucinex as directed.  Humidifier in bedroom. Continue Mucinex.  Tussionex per orders.  Call or return to clinic if symptoms are not  improving.

## 2014-11-07 NOTE — Addendum Note (Signed)
Addended by: Raiford Noble on: 11/07/2014 11:21 AM   Modules accepted: Orders

## 2014-11-07 NOTE — Assessment & Plan Note (Signed)
Rx Augmentin.  Increase fluids.  Rest.  Saline nasal spray.  Probiotic.  Mucinex as directed.  Humidifier in bedroom. Continue Mucinex.  Tussionex per orders.  Call or return to clinic if symptoms are not improving.

## 2014-11-07 NOTE — Addendum Note (Signed)
Addended by: Raiford Noble on: 11/07/2014 11:07 AM   Modules accepted: Orders

## 2014-11-07 NOTE — Progress Notes (Signed)
Pre visit review using our clinic review tool, if applicable. No additional management support is needed unless otherwise documented below in the visit note. 

## 2014-11-07 NOTE — Patient Instructions (Signed)
Please take antibiotic as directed.  Increase fluid intake.  Use Saline nasal spray.  Take a daily multivitamin. Continue saline nasal spray.  Place a humidifier in the bedroom.  Please call or return clinic if symptoms are not improving.  Sinusitis Sinusitis is redness, soreness, and swelling (inflammation) of the paranasal sinuses. Paranasal sinuses are air pockets within the bones of your face (beneath the eyes, the middle of the forehead, or above the eyes). In healthy paranasal sinuses, mucus is able to drain out, and air is able to circulate through them by way of your nose. However, when your paranasal sinuses are inflamed, mucus and air can become trapped. This can allow bacteria and other germs to grow and cause infection. Sinusitis can develop quickly and last only a short time (acute) or continue over a long period (chronic). Sinusitis that lasts for more than 12 weeks is considered chronic.  CAUSES  Causes of sinusitis include:  Allergies.  Structural abnormalities, such as displacement of the cartilage that separates your nostrils (deviated septum), which can decrease the air flow through your nose and sinuses and affect sinus drainage.  Functional abnormalities, such as when the small hairs (cilia) that line your sinuses and help remove mucus do not work properly or are not present. SYMPTOMS  Symptoms of acute and chronic sinusitis are the same. The primary symptoms are pain and pressure around the affected sinuses. Other symptoms include:  Upper toothache.  Earache.  Headache.  Bad breath.  Decreased sense of smell and taste.  A cough, which worsens when you are lying flat.  Fatigue.  Fever.  Thick drainage from your nose, which often is green and may contain pus (purulent).  Swelling and warmth over the affected sinuses. DIAGNOSIS  Your caregiver will perform a physical exam. During the exam, your caregiver may:  Look in your nose for signs of abnormal growths in  your nostrils (nasal polyps).  Tap over the affected sinus to check for signs of infection.  View the inside of your sinuses (endoscopy) with a special imaging device with a light attached (endoscope), which is inserted into your sinuses. If your caregiver suspects that you have chronic sinusitis, one or more of the following tests may be recommended:  Allergy tests.  Nasal culture A sample of mucus is taken from your nose and sent to a lab and screened for bacteria.  Nasal cytology A sample of mucus is taken from your nose and examined by your caregiver to determine if your sinusitis is related to an allergy. TREATMENT  Most cases of acute sinusitis are related to a viral infection and will resolve on their own within 10 days. Sometimes medicines are prescribed to help relieve symptoms (pain medicine, decongestants, nasal steroid sprays, or saline sprays).  However, for sinusitis related to a bacterial infection, your caregiver will prescribe antibiotic medicines. These are medicines that will help kill the bacteria causing the infection.  Rarely, sinusitis is caused by a fungal infection. In theses cases, your caregiver will prescribe antifungal medicine. For some cases of chronic sinusitis, surgery is needed. Generally, these are cases in which sinusitis recurs more than 3 times per year, despite other treatments. HOME CARE INSTRUCTIONS   Drink plenty of water. Water helps thin the mucus so your sinuses can drain more easily.  Use a humidifier.  Inhale steam 3 to 4 times a day (for example, sit in the bathroom with the shower running).  Apply a warm, moist washcloth to your face 3 to  4 times a day, or as directed by your caregiver.  Use saline nasal sprays to help moisten and clean your sinuses.  Take over-the-counter or prescription medicines for pain, discomfort, or fever only as directed by your caregiver. SEEK IMMEDIATE MEDICAL CARE IF:  You have increasing pain or severe  headaches.  You have nausea, vomiting, or drowsiness.  You have swelling around your face.  You have vision problems.  You have a stiff neck.  You have difficulty breathing. MAKE SURE YOU:   Understand these instructions.  Will watch your condition.  Will get help right away if you are not doing well or get worse. Document Released: 08/19/2005 Document Revised: 11/11/2011 Document Reviewed: 09/03/2011 [facility] Patient Information 2014 [place], [place].

## 2014-11-21 ENCOUNTER — Ambulatory Visit (INDEPENDENT_AMBULATORY_CARE_PROVIDER_SITE_OTHER): Payer: Commercial Managed Care - HMO | Admitting: Family Medicine

## 2014-11-21 ENCOUNTER — Encounter: Payer: Self-pay | Admitting: Family Medicine

## 2014-11-21 VITALS — BP 152/88 | HR 68 | Temp 98.7°F | Resp 16 | Wt 153.1 lb

## 2014-11-21 DIAGNOSIS — I10 Essential (primary) hypertension: Secondary | ICD-10-CM

## 2014-11-21 DIAGNOSIS — I499 Cardiac arrhythmia, unspecified: Secondary | ICD-10-CM

## 2014-11-21 DIAGNOSIS — J019 Acute sinusitis, unspecified: Secondary | ICD-10-CM | POA: Diagnosis not present

## 2014-11-21 DIAGNOSIS — R0789 Other chest pain: Secondary | ICD-10-CM

## 2014-11-21 DIAGNOSIS — K219 Gastro-esophageal reflux disease without esophagitis: Secondary | ICD-10-CM

## 2014-11-21 DIAGNOSIS — B9689 Other specified bacterial agents as the cause of diseases classified elsewhere: Secondary | ICD-10-CM

## 2014-11-21 MED ORDER — HYDROCOD POLST-CHLORPHEN POLST 10-8 MG/5ML PO LQCR: 5.0000 mL | Freq: Two times a day (BID) | ORAL | Status: DC | PRN

## 2014-11-21 MED ORDER — CEFDINIR 300 MG PO CAPS: 300.0000 mg | ORAL_CAPSULE | Freq: Two times a day (BID) | ORAL | Status: AC

## 2014-11-21 MED ORDER — LOSARTAN POTASSIUM 100 MG PO TABS: 100.0000 mg | ORAL_TABLET | Freq: Every day | ORAL | Status: DC

## 2014-11-21 NOTE — Progress Notes (Signed)
[name]  [id number] 1945/12/17 11/21/2014      Progress Note-Follow Up  Subjective  Chief Complaint  Chief Complaint  Patient presents with  . Facial Pain    residual for 2 weeks, worsened on Saturday  . Cough    productive, brown in color  . congestion    pt feels it is in his chest    HPI  Patient is a 69 y.o. male in today for routine medical care. Patient is in today with numerous complaints. He has been struggling with increased congestion and cough for about 6 days. He has had congestion and headache. Notes malaise and myalgias. Cough is worsening in keeping him up at night. For last 2 days it is been fairly unremitting. With the cough is also noted increasing chest pain. No fevers or chills Denies SOB/HA/fevers/GI or GU c/o. Taking meds as prescribed  Past Medical History  Diagnosis Date  . Allergic rhinitis   . COPD (chronic obstructive pulmonary disease)     FeV1 64%-2007  . GERD (gastroesophageal reflux disease)   . History of lung abscess     bronchiectasis with RMLandRLL ersection -1996- Dr Arlyce Dice  . Hypertension   . Osteoarthritis     hands and knees  . Emphysema   . Impaired vision     glasses  . Back pain 01/12/2013    Past Surgical History  Procedure Laterality Date  . Lung surgery      RML andRUL removed due to bleeding and bronchiectasis and lung abcess  . Neck surgery  05/2008  . Lower back surgery  09/2008    06/2009  . Hernia repair  10/2009  . Left knee    . Right foot surgery      Family History  Problem Relation Age of Onset  . Prostate cancer Father     father died prostate ca  . Coronary artery disease      1st degree relative<60  . Stroke      1st degree relative<50  . Heart failure Mother     age 61  . Colon cancer Paternal Grandmother     History   Social History  . Marital Status: Married    Spouse Name: N/A  . Number of Children: N/A  . Years of Education: N/A   Occupational History  . retired Financial risk analyst    Social History Main Topics  . Smoking status: Former Smoker -- 1.50 packs/day for 38 years    Types: Cigarettes    Quit date: 09/03/2000  . Smokeless tobacco: Never Used  . Alcohol Use: 1.2 - 1.8 oz/week    2-3 Cans of beer per week  . Drug Use: No  . Sexual Activity: No   Other Topics Concern  . Not on file   Social History Narrative   Retired Music therapist   Patient states former smoker. 1 1/2 ppd x 38 yrs  Quit in Jan 2002   Married - 2 weeks (4th marriage)   divorced,  remarried 84-2004 (lost wife to lung ca),  remarried (divorced),    1 son  - 57 [name])   Alcohol use-yes (2-3 beers per week)            Current Outpatient Prescriptions on File Prior to Visit  Medication Sig Dispense Refill  . albuterol (PROVENTIL HFA;VENTOLIN HFA) 108 (90 BASE) MCG/ACT inhaler Inhale 2 puffs into the lungs every 6 (six) hours as needed for wheezing or shortness of breath.  3 Inhaler 3  . amLODipine (NORVASC) 5 MG tablet Take 1 tablet (5 mg total) by mouth daily. 90 tablet 0  . aspirin 81 MG tablet On hold    . aspirin-acetaminophen-caffeine (EXCEDRIN MIGRAINE) 250-250-65 MG per tablet Take 1 tablet by mouth as needed.     . budesonide-formoterol (SYMBICORT) 160-4.5 MCG/ACT inhaler Inhale 2 puffs into the lungs 2 (two) times daily. 3 Inhaler 3  . calcium carbonate (TUMS EX) 750 MG chewable tablet Chew 1 tablet by mouth 2 (two) times daily.      . diclofenac sodium (VOLTAREN) 1 % GEL Apply 1 application topically 4 (four) times daily as needed. 3 Tube 0  . fluticasone (FLONASE) 50 MCG/ACT nasal spray USE 2 SPRAYS IN EACH NOSTRIL ONE TIME DAILY 48 g 2  . glucosamine-chondroitin 500-400 MG tablet Take 1 tablet by mouth 2 (two) times daily.      Marland Kitchen guaiFENesin (MUCINEX) 600 MG 12 hr tablet Take 600 mg by mouth 2 (two) times daily.      Marland Kitchen loratadine (CLARITIN) 10 MG tablet Take 10 mg by mouth daily.    Marland Kitchen losartan (COZAAR) 100 MG tablet Take 0.5 tablets (50 mg total) by mouth  daily. 90 tablet 0  . meloxicam (MOBIC) 15 MG tablet Take 1 tablet (15 mg total) by mouth daily as needed for pain (with food). 90 tablet 1  . Menthol (ROBITUSSIN COUGH DROPS MT) Use as directed in the mouth or throat as needed.    . montelukast (SINGULAIR) 10 MG tablet Take 1 tablet (10 mg total) by mouth daily. 90 tablet 0  . Multiple Vitamin (MULTIVITAMIN) tablet Take 1 tablet by mouth 3 (three) times a week.      . Pseudoeph-Doxylamine-DM-APAP (NYQUIL PO) Take by mouth as needed.    . traMADol (ULTRAM) 50 MG tablet Take 1 tablet (50 mg total) by mouth every 8 (eight) hours as needed. 40 tablet 0  . amoxicillin-clavulanate (AUGMENTIN) 875-125 MG per tablet Take 1 tablet by mouth 2 (two) times daily. (Patient not taking: Reported on 11/21/2014) 20 tablet 0  . chlorpheniramine-HYDROcodone (TUSSIONEX PENNKINETIC ER) 10-8 MG/5ML LQCR Take 5 mLs by mouth every 12 (twelve) hours as needed. (Patient not taking: Reported on 11/21/2014) 115 mL 0   No current facility-administered medications on file prior to visit.    No Known Allergies  Review of Systems  Review of Systems  Constitutional: Negative for fever and malaise/fatigue.  HENT: Positive for congestion.   Eyes: Negative for discharge.  Respiratory: Positive for cough. Negative for shortness of breath.   Cardiovascular: Positive for chest pain. Negative for palpitations and leg swelling.  Gastrointestinal: Negative for nausea, abdominal pain and diarrhea.  Genitourinary: Negative for dysuria.  Musculoskeletal: Negative for falls.  Skin: Negative for rash.  Neurological: Negative for loss of consciousness and headaches.  Endo/Heme/Allergies: Negative for polydipsia.  Psychiatric/Behavioral: Negative for depression and suicidal ideas. The patient is not nervous/anxious and does not have insomnia.     Objective  BP 152/88 mmHg  Pulse 68  Temp(Src) 98.7 F (37.1 C) (Oral)  Resp 16  Wt 153 lb 2 oz (69.457 kg)  SpO2 95%  Physical  Exam  Physical Exam  Constitutional: He is oriented to person, place, and time and well-developed, well-nourished, and in no distress. No distress.  HENT:  Head: Normocephalic and atraumatic.  Eyes: Conjunctivae are normal.  Neck: Neck supple. No thyromegaly present.  Cardiovascular: Normal rate, regular rhythm and normal heart sounds.   No murmur heard.  Pulmonary/Chest: Effort normal and breath sounds normal. No respiratory distress.  Abdominal: He exhibits no distension and no mass. There is no tenderness.  Musculoskeletal: He exhibits no edema.  Neurological: He is alert and oriented to person, place, and time.  Skin: Skin is warm.  Psychiatric: Memory, affect and judgment normal.    Lab Results  Component Value Date   TSH 0.93 07/22/2014   Lab Results  Component Value Date   WBC 6.1 07/22/2014   HGB 14.3 07/22/2014   HCT 43.9 07/22/2014   MCV 93.6 07/22/2014   PLT 239.0 07/22/2014   Lab Results  Component Value Date   CREATININE 0.9 08/10/2014   BUN 21 08/10/2014   NA 135 08/10/2014   K 4.5 08/10/2014   CL 101 08/10/2014   CO2 28 08/10/2014   Lab Results  Component Value Date   ALT 19 07/22/2014   AST 23 07/22/2014   ALKPHOS 73 07/22/2014   BILITOT 1.0 07/22/2014   Lab Results  Component Value Date   CHOL 163 07/15/2013   Lab Results  Component Value Date   HDL 48 07/22/2014   Lab Results  Component Value Date   LDLCALC 93 07/15/2013   Lab Results  Component Value Date   TRIG 64 07/22/2014   Lab Results  Component Value Date   CHOLHDL 3.1 07/22/2014     Assessment & Plan  Benign essential HTN Poorly controlled will alter medications, encouraged DASH diet, minimize caffeine and obtain adequate sleep. Report concerning symptoms and follow up as directed and as needed. Increase Losartan.    GERD Avoid offending foods, start probiotics. Do not eat large meals in late evening and consider raising head of bed.    Acute bacterial  sinusitis Started on Omnicef, Tussionex, Mucinex and probiotics. Call if no improvement.    Atypical chest pain Likely related to cough from respiratory illness but with increasing palpitations and h/o HTN will refer to cardiology for further consideration will seek further care if symptoms worsen

## 2014-11-21 NOTE — Progress Notes (Signed)
Pre visit review using our clinic review tool, if applicable. No additional management support is needed unless otherwise documented below in the visit note. 

## 2014-11-21 NOTE — Patient Instructions (Signed)

## 2014-11-22 ENCOUNTER — Ambulatory Visit: Payer: Commercial Managed Care - HMO | Admitting: Family Medicine

## 2014-12-01 ENCOUNTER — Encounter: Payer: Self-pay | Admitting: Family Medicine

## 2014-12-01 DIAGNOSIS — R0789 Other chest pain: Secondary | ICD-10-CM

## 2014-12-01 HISTORY — DX: Other chest pain: R07.89

## 2014-12-01 NOTE — Assessment & Plan Note (Signed)
Started on Omnicef, Tussionex, Mucinex and probiotics. Call if no improvement.

## 2014-12-01 NOTE — Assessment & Plan Note (Signed)
Likely related to cough from respiratory illness but with increasing palpitations and h/o HTN will refer to cardiology for further consideration will seek further care if symptoms worsen

## 2014-12-01 NOTE — Assessment & Plan Note (Signed)
Poorly controlled will alter medications, encouraged DASH diet, minimize caffeine and obtain adequate sleep. Report concerning symptoms and follow up as directed and as needed. Increase Losartan 

## 2014-12-01 NOTE — Assessment & Plan Note (Signed)
Avoid offending foods, start probiotics. Do not eat large meals in late evening and consider raising head of bed.  

## 2014-12-27 NOTE — Progress Notes (Signed)
Cardiology Office Note   Date:  12/28/2014   ID:  [name], DOB 1946-04-19, MRN [id number]  PCP:  Penni Homans, MD  Cardiologist:   Jenkins Rouge, MD   No chief complaint on file.     History of Present Illness: [name] is a 69 y.o. male who presents for evaluation of atypical chest pain.  Seen by Dr Randel Pigg for URI, cough and sinusitis and in this setting had some dyspnea chest pain and palpitations History of partial RLL/RML resection 2009 from bronchiectasis   Echo 08/04/13 reviewed and normal Study Conclusions  - Left ventricle: The cavity size was normal. There was mild focal basal hypertrophy of the septum. Systolic function was normal. The estimated ejection fraction was in the range of 55% to 60%. Wall motion was normal; there were no regional wall motion abnormalities. - Pulmonary arteries: PA peak pressure: 65mm Hg (S).  48 hour holter December 2015  SR PVCs; no sustained arrhythmia  Very active fishing, working with horses and taking care of 2 acres No chest pain.    Discussed possible stress testing to r/o CAD and he prefers to wait.  Ok to have echo.   Past Medical History  Diagnosis Date  . Allergic rhinitis   . COPD (chronic obstructive pulmonary disease)     FeV1 64%-2007  . GERD (gastroesophageal reflux disease)   . History of lung abscess     bronchiectasis with RMLandRLL ersection -1996- Dr Arlyce Dice  . Hypertension   . Osteoarthritis     hands and knees  . Emphysema   . Impaired vision     glasses  . Back pain 01/12/2013  . Atypical chest pain 12/01/2014    Past Surgical History  Procedure Laterality Date  . Lung surgery      RML andRUL removed due to bleeding and bronchiectasis and lung abcess  . Neck surgery  05/2008  . Lower back surgery  09/2008    06/2009  . Hernia repair  10/2009  . Left knee    . Right foot surgery       Current Outpatient Prescriptions  Medication Sig Dispense Refill  . albuterol (PROVENTIL  HFA;VENTOLIN HFA) 108 (90 BASE) MCG/ACT inhaler Inhale 2 puffs into the lungs every 6 (six) hours as needed for wheezing or shortness of breath. 3 Inhaler 3  . amLODipine (NORVASC) 5 MG tablet Take 1 tablet (5 mg total) by mouth daily. 90 tablet 0  . aspirin 81 MG tablet On hold    . aspirin-acetaminophen-caffeine (EXCEDRIN MIGRAINE) 250-250-65 MG per tablet Take 1 tablet by mouth as needed.     . budesonide-formoterol (SYMBICORT) 160-4.5 MCG/ACT inhaler Inhale 2 puffs into the lungs 2 (two) times daily. 3 Inhaler 3  . calcium carbonate (TUMS EX) 750 MG chewable tablet Chew 1 tablet by mouth 2 (two) times daily.      . diclofenac sodium (VOLTAREN) 1 % GEL Apply 1 application topically 4 (four) times daily as needed. 3 Tube 0  . fluticasone (FLONASE) 50 MCG/ACT nasal spray USE 2 SPRAYS IN EACH NOSTRIL ONE TIME DAILY 48 g 2  . glucosamine-chondroitin 500-400 MG tablet Take 1 tablet by mouth 2 (two) times daily.      Marland Kitchen guaiFENesin (MUCINEX) 600 MG 12 hr tablet Take 600 mg by mouth 2 (two) times daily.      Marland Kitchen loratadine (CLARITIN) 10 MG tablet Take 10 mg by mouth daily.    Marland Kitchen losartan (COZAAR) 100 MG tablet Take  1 tablet (100 mg total) by mouth daily. 90 tablet 1  . meloxicam (MOBIC) 15 MG tablet Take 1 tablet (15 mg total) by mouth daily as needed for pain (with food). 90 tablet 1  . Menthol (ROBITUSSIN COUGH DROPS MT) Use as directed in the mouth or throat as needed.    . montelukast (SINGULAIR) 10 MG tablet Take 1 tablet (10 mg total) by mouth daily. 90 tablet 0  . Multiple Vitamin (MULTIVITAMIN) tablet Take 1 tablet by mouth 3 (three) times a week.      . traMADol (ULTRAM) 50 MG tablet Take 1 tablet (50 mg total) by mouth every 8 (eight) hours as needed. 40 tablet 0   No current facility-administered medications for this visit.    Allergies:   Review of patient's allergies indicates no known allergies.    Social History:  The patient  reports that he quit smoking about 14 years ago. His  smoking use included Cigarettes. He has a 57 pack-year smoking history. He has never used smokeless tobacco. He reports that he drinks about 1.2 - 1.8 oz of alcohol per week. He reports that he does not use illicit drugs.   Family History:  The patient's family history includes Colon cancer in his paternal grandmother; Coronary artery disease in an other family member; Heart failure in his mother; Prostate cancer in his father; Stroke in an other family member.    ROS:  Please see the history of present illness.   Otherwise, review of systems are positive for none.   All other systems are reviewed and negative.    PHYSICAL EXAM: VS:  BP 132/74 mmHg  Pulse 76  Ht 5\' 8"  (1.727 m)  Wt 149 lb 12.8 oz (67.949 kg)  BMI 22.78 kg/m2 , BMI Body mass index is 22.78 kg/(m^2). Affect appropriate Healthy:  appears stated age 35: normal Neck supple with no adenopathy JVP normal no bruits no thyromegaly Lungs rhonchi and wheezing left and right  good diaphragmatic motion Heart:  S1/S2 no murmur, no rub, gallop or click PMI normal Abdomen: benighn, BS positve, no tenderness, no AAA no bruit.  No HSM or HJR Distal pulses intact with no bruits No edema Neuro non-focal Skin warm and dry No muscular weakness    EKG:   11/21/14  SR 88 PAC PVC    Recent Labs: 07/22/2014: ALT 19; Hemoglobin 14.3; Platelets 239.0; TSH 0.93 08/10/2014: BUN 21; Creatinine 0.9; Potassium 4.5; Sodium 135    Lipid Panel    Component Value Date/Time   CHOL 163 07/15/2013 0903   TRIG 64 07/22/2014 1031   HDL 48 07/22/2014 1031   CHOLHDL 3.1 07/22/2014 1031   CHOLHDL 3.0 07/15/2013 0903   VLDL 15 07/15/2013 0903   LDLCALC 93 07/15/2013 0903   LDLDIRECT 85 07/22/2014 1031      Wt Readings from Last 3 Encounters:  12/28/14 149 lb 12.8 oz (67.949 kg)  11/21/14 153 lb 2 oz (69.457 kg)  11/07/14 153 lb 3.2 oz (69.491 kg)      Other studies Reviewed: Additional studies/ records that were reviewed today  include:  Epic notes Holter and old echo.    ASSESSMENT AND PLAN:  1.  PVC;s  Recheck echo for EF.  He is very active and works out daily and prefers to defer stress testing  If echo shows normal EF no further w/u Would be hesitant to use beta blocker with his active wheezing and lung disease 2. HTN:  Well controlled.  Continue current  medications and low sodium Dash type diet.   3. Bronciectasis;  F/u pulmonary continue inhalers surprised he does not desat and use oxygen at home.  Suspect PVC;s related to lung disease Echo to r/o cor pulmonale   Current medicines are reviewed at length with the patient today.  The patient does not have concerns regarding medicines.  The following changes have been made:  no change  Labs/ tests ordered today include:  Echo   No orders of the defined types were placed in this encounter.     Disposition:   FU with me in 6 months      Signed, Jenkins Rouge, MD  12/28/2014 11:25 AM    [facility] [address], [place], [place]  [address] Phone: [phone]; Fax: [phone]

## 2014-12-28 ENCOUNTER — Encounter: Payer: Self-pay | Admitting: Cardiovascular Disease

## 2014-12-28 ENCOUNTER — Ambulatory Visit (INDEPENDENT_AMBULATORY_CARE_PROVIDER_SITE_OTHER): Payer: Commercial Managed Care - HMO | Admitting: Cardiovascular Disease

## 2014-12-28 VITALS — BP 132/74 | HR 76 | Ht 68.0 in | Wt 149.8 lb

## 2014-12-28 DIAGNOSIS — I493 Ventricular premature depolarization: Secondary | ICD-10-CM

## 2014-12-28 NOTE — Patient Instructions (Signed)
Medication Instructions:  NO CHANGES  Labwork:NONE  Testing/Procedures: Your physician has requested that you have an echocardiogram. Echocardiography is a painless test that uses sound waves to create images of your heart. It provides your doctor with information about the size and shape of your heart and how well your heart's chambers and valves are working. This procedure takes approximately one hour. There are no restrictions for this procedure.  Follow-Up: Your physician wants you to follow-up in: YEAR WITH DR NISHAN You will receive a reminder letter in the mail two months in advance. If you don't receive a letter, please call our office to schedule the follow-up appointment.  Any Other Special Instructions Will Be Listed Below (If Applicable).   

## 2014-12-29 ENCOUNTER — Encounter: Payer: Self-pay | Admitting: Family Medicine

## 2014-12-29 ENCOUNTER — Ambulatory Visit (INDEPENDENT_AMBULATORY_CARE_PROVIDER_SITE_OTHER): Payer: Commercial Managed Care - HMO | Admitting: Family Medicine

## 2014-12-29 VITALS — BP 131/53 | HR 68 | Temp 98.0°F | Resp 18 | Ht 68.0 in | Wt 150.0 lb

## 2014-12-29 DIAGNOSIS — K219 Gastro-esophageal reflux disease without esophagitis: Secondary | ICD-10-CM

## 2014-12-29 DIAGNOSIS — J309 Allergic rhinitis, unspecified: Secondary | ICD-10-CM

## 2014-12-29 DIAGNOSIS — J42 Unspecified chronic bronchitis: Secondary | ICD-10-CM

## 2014-12-29 DIAGNOSIS — R0789 Other chest pain: Secondary | ICD-10-CM

## 2014-12-29 DIAGNOSIS — I10 Essential (primary) hypertension: Secondary | ICD-10-CM | POA: Diagnosis not present

## 2014-12-29 MED ORDER — LOSARTAN POTASSIUM 100 MG PO TABS: 50.0000 mg | ORAL_TABLET | Freq: Two times a day (BID) | ORAL | Status: DC

## 2014-12-29 NOTE — Progress Notes (Signed)
Pre visit review using our clinic review tool, if applicable. No additional management support is needed unless otherwise documented below in the visit note. 

## 2014-12-29 NOTE — Patient Instructions (Signed)
Probiotics daily such as Digestive Advantage or [facility]   Allergic Rhinitis Allergic rhinitis is when the mucous membranes in the nose respond to allergens. Allergens are particles in the air that cause your body to have an allergic reaction. This causes you to release allergic antibodies. Through a chain of events, these eventually cause you to release histamine into the blood stream. Although meant to protect the body, it is this release of histamine that causes your discomfort, such as frequent sneezing, congestion, and an itchy, runny nose.  CAUSES  Seasonal allergic rhinitis (hay fever) is caused by pollen allergens that may come from grasses, trees, and weeds. Year-round allergic rhinitis (perennial allergic rhinitis) is caused by allergens such as house dust mites, pet dander, and mold spores.  SYMPTOMS   Nasal stuffiness (congestion).  Itchy, runny nose with sneezing and tearing of the eyes. DIAGNOSIS  Your health care provider can help you determine the allergen or allergens that trigger your symptoms. If you and your health care provider are unable to determine the allergen, skin or blood testing may be used. TREATMENT  Allergic rhinitis does not have a cure, but it can be controlled by:  Medicines and allergy shots (immunotherapy).  Avoiding the allergen. Hay fever may often be treated with antihistamines in pill or nasal spray forms. Antihistamines block the effects of histamine. There are over-the-counter medicines that may help with nasal congestion and swelling around the eyes. Check with your health care provider before taking or giving this medicine.  If avoiding the allergen or the medicine prescribed do not work, there are many new medicines your health care provider can prescribe. Stronger medicine may be used if initial measures are ineffective. Desensitizing injections can be used if medicine and avoidance does not work. Desensitization is when a patient is  given ongoing shots until the body becomes less sensitive to the allergen. Make sure you follow up with your health care provider if problems continue. HOME CARE INSTRUCTIONS It is not possible to completely avoid allergens, but you can reduce your symptoms by taking steps to limit your exposure to them. It helps to know exactly what you are allergic to so that you can avoid your specific triggers. SEEK MEDICAL CARE IF:   You have a fever.  You develop a cough that does not stop easily (persistent).  You have shortness of breath.  You start wheezing.  Symptoms interfere with normal daily activities. Document Released: 05/14/2001 Document Revised: 08/24/2013 Document Reviewed: 04/26/2013 [employer] Patient Information 2015 [place], [place]. This information is not intended to replace advice given to you by your health care provider. Make sure you discuss any questions you have with your health care provider.

## 2015-01-03 ENCOUNTER — Ambulatory Visit (HOSPITAL_COMMUNITY): Payer: Commercial Managed Care - HMO | Attending: Internal Medicine

## 2015-01-03 ENCOUNTER — Other Ambulatory Visit: Payer: Self-pay

## 2015-01-03 DIAGNOSIS — I10 Essential (primary) hypertension: Secondary | ICD-10-CM | POA: Insufficient documentation

## 2015-01-03 DIAGNOSIS — Z87891 Personal history of nicotine dependence: Secondary | ICD-10-CM | POA: Insufficient documentation

## 2015-01-03 DIAGNOSIS — I493 Ventricular premature depolarization: Secondary | ICD-10-CM | POA: Diagnosis not present

## 2015-01-04 ENCOUNTER — Telehealth: Payer: Self-pay

## 2015-01-04 NOTE — Telephone Encounter (Signed)
Notes Recorded by Newt Minion, RN on 01/04/2015 at 4:26 PM Pt made aware of results no questions at this time.   Notes Recorded by Josue Hector, MD on 01/03/2015 at 6:18 PM Normal echo

## 2015-01-07 ENCOUNTER — Other Ambulatory Visit: Payer: Self-pay | Admitting: Physician Assistant

## 2015-01-08 ENCOUNTER — Encounter: Payer: Self-pay | Admitting: Family Medicine

## 2015-01-08 MED ORDER — MONTELUKAST SODIUM 10 MG PO TABS: 10.0000 mg | ORAL_TABLET | Freq: Every day | ORAL | Status: DC

## 2015-01-08 NOTE — Assessment & Plan Note (Signed)
No recent episodes

## 2015-01-08 NOTE — Assessment & Plan Note (Signed)
Continues to struggle with mild cough and clear rhinorrhea. Tolerable symptoms, encouraged daily Flonase, Singulair, Zyrtec

## 2015-01-08 NOTE — Assessment & Plan Note (Signed)
Avoid offending foods, start probiotics. Do not eat large meals in late evening and consider raising head of bed.  

## 2015-01-08 NOTE — Progress Notes (Signed)
[name]  [id number] 1946/07/22 01/08/2015      Progress Note-Follow Up  Subjective  Chief Complaint  Chief Complaint  Patient presents with  . Follow-up    Pt had bronchitis about 5 weeks ago and is here to f/u    HPI  Patient is a 69 y.o. male in today for routine medical care. No recent hospitalization or acute illness.  Denies CP/palp/SOB/HA/congestion/fevers/GI or GU c/o. Taking meds as prescribedoday for follow-up. His greatest complaint is persistent allergies but he does find them tolerable. He is noting trouble with intermittent-like cough which does not keep him up at night. He also notes nasal congestion productive of clear rhinorrhea at times. No significant eye or ear symptoms.   Past Medical History  Diagnosis Date  . Allergic rhinitis   . COPD (chronic obstructive pulmonary disease)     FeV1 64%-2007  . GERD (gastroesophageal reflux disease)   . History of lung abscess     bronchiectasis with RMLandRLL ersection -1996- Dr Arlyce Dice  . Hypertension   . Osteoarthritis     hands and knees  . Emphysema   . Impaired vision     glasses  . Back pain 01/12/2013  . Atypical chest pain 12/01/2014    Past Surgical History  Procedure Laterality Date  . Lung surgery      RML andRUL removed due to bleeding and bronchiectasis and lung abcess  . Neck surgery  05/2008  . Lower back surgery  09/2008    06/2009  . Hernia repair  10/2009  . Left knee    . Right foot surgery      Family History  Problem Relation Age of Onset  . Prostate cancer Father     father died prostate ca  . Coronary artery disease      1st degree relative<60  . Stroke      1st degree relative<50  . Heart failure Mother     age 49  . Colon cancer Paternal Grandmother     History   Social History  . Marital Status: Married    Spouse Name: N/A  . Number of Children: N/A  . Years of Education: N/A   Occupational History  . retired Music therapist    Social History Main Topics    . Smoking status: Former Smoker -- 1.50 packs/day for 38 years    Types: Cigarettes    Quit date: 09/03/2000  . Smokeless tobacco: Never Used  . Alcohol Use: 1.2 - 1.8 oz/week    2-3 Cans of beer per week  . Drug Use: No  . Sexual Activity: No   Other Topics Concern  . Not on file   Social History Narrative   Retired Music therapist   Patient states former smoker. 1 1/2 ppd x 38 yrs  Quit in Jan 2002   Married - 2 weeks (4th marriage)   divorced,  remarried 84-2004 (lost wife to lung ca),  remarried (divorced),    1 son  - 42 [name])   Alcohol use-yes (2-3 beers per week)            Current Outpatient Prescriptions on File Prior to Visit  Medication Sig Dispense Refill  . albuterol (PROVENTIL HFA;VENTOLIN HFA) 108 (90 BASE) MCG/ACT inhaler Inhale 2 puffs into the lungs every 6 (six) hours as needed for wheezing or shortness of breath. 3 Inhaler 3  . amLODipine (NORVASC) 5 MG tablet Take 1 tablet (5 mg total) by mouth daily.  90 tablet 0  . aspirin-acetaminophen-caffeine (EXCEDRIN MIGRAINE) 696-789-38 MG per tablet Take 1 tablet by mouth as needed.     . budesonide-formoterol (SYMBICORT) 160-4.5 MCG/ACT inhaler Inhale 2 puffs into the lungs 2 (two) times daily. 3 Inhaler 3  . calcium carbonate (TUMS EX) 750 MG chewable tablet Chew 1 tablet by mouth 2 (two) times daily.      . diclofenac sodium (VOLTAREN) 1 % GEL Apply 1 application topically 4 (four) times daily as needed. 3 Tube 0  . fluticasone (FLONASE) 50 MCG/ACT nasal spray USE 2 SPRAYS IN EACH NOSTRIL ONE TIME DAILY 48 g 2  . glucosamine-chondroitin 500-400 MG tablet Take 1 tablet by mouth 2 (two) times daily.      Marland Kitchen guaiFENesin (MUCINEX) 600 MG 12 hr tablet Take 600 mg by mouth 2 (two) times daily.      Marland Kitchen loratadine (CLARITIN) 10 MG tablet Take 10 mg by mouth daily.    . meloxicam (MOBIC) 15 MG tablet Take 1 tablet (15 mg total) by mouth daily as needed for pain (with food). 90 tablet 1  . Menthol (ROBITUSSIN COUGH  DROPS MT) Use as directed in the mouth or throat as needed.    . montelukast (SINGULAIR) 10 MG tablet Take 1 tablet (10 mg total) by mouth daily. 90 tablet 0  . Multiple Vitamin (MULTIVITAMIN) tablet Take 1 tablet by mouth 3 (three) times a week.      . traMADol (ULTRAM) 50 MG tablet Take 1 tablet (50 mg total) by mouth every 8 (eight) hours as needed. 40 tablet 0   No current facility-administered medications on file prior to visit.    No Known Allergies  Review of Systems  Review of Systems  Constitutional: Negative for fever and malaise/fatigue.  HENT: Positive for congestion.   Eyes: Negative for discharge.  Respiratory: Positive for cough. Negative for shortness of breath.   Cardiovascular: Negative for chest pain, palpitations and leg swelling.  Gastrointestinal: Negative for nausea, abdominal pain and diarrhea.  Genitourinary: Negative for dysuria.  Musculoskeletal: Negative for falls.  Skin: Negative for rash.  Neurological: Negative for loss of consciousness and headaches.  Endo/Heme/Allergies: Negative for polydipsia.  Psychiatric/Behavioral: Negative for depression and suicidal ideas. The patient is not nervous/anxious and does not have insomnia.     Objective  BP 131/53 mmHg  Pulse 68  Temp(Src) 98 F (36.7 C) (Oral)  Resp 18  Ht 5\' 8"  (1.727 m)  Wt 150 lb (68.04 kg)  BMI 22.81 kg/m2  SpO2 96%  Physical Exam  Physical Exam  Constitutional: He is oriented to person, place, and time and well-developed, well-nourished, and in no distress. No distress.  HENT:  Head: Normocephalic and atraumatic.  Eyes: Conjunctivae are normal.  Neck: Neck supple. No thyromegaly present.  Cardiovascular: Normal rate, regular rhythm and normal heart sounds.   No murmur heard. Pulmonary/Chest: Effort normal and breath sounds normal. No respiratory distress.  Abdominal: He exhibits no distension and no mass. There is no tenderness.  Musculoskeletal: He exhibits no edema.    Neurological: He is alert and oriented to person, place, and time.  Skin: Skin is warm.  Psychiatric: Memory, affect and judgment normal.    Lab Results  Component Value Date   TSH 0.93 07/22/2014   Lab Results  Component Value Date   WBC 6.1 07/22/2014   HGB 14.3 07/22/2014   HCT 43.9 07/22/2014   MCV 93.6 07/22/2014   PLT 239.0 07/22/2014   Lab Results  Component Value Date  CREATININE 0.9 08/10/2014   BUN 21 08/10/2014   NA 135 08/10/2014   K 4.5 08/10/2014   CL 101 08/10/2014   CO2 28 08/10/2014   Lab Results  Component Value Date   ALT 19 07/22/2014   AST 23 07/22/2014   ALKPHOS 73 07/22/2014   BILITOT 1.0 07/22/2014   Lab Results  Component Value Date   CHOL 163 07/15/2013   Lab Results  Component Value Date   HDL 48 07/22/2014   Lab Results  Component Value Date   LDLCALC 93 07/15/2013   Lab Results  Component Value Date   TRIG 64 07/22/2014   Lab Results  Component Value Date   CHOLHDL 3.1 07/22/2014     Assessment & Plan  Benign essential HTN Well controlled, no changes to meds. Encouraged heart healthy diet such as the DASH diet and exercise as tolerated.    Allergic rhinitis Continues to struggle with mild cough and clear rhinorrhea. Tolerable symptoms, encouraged daily Flonase, Singulair, Zyrtec   COPD (chronic obstructive pulmonary disease) No recent exacerbations. Continue current meds   GERD Avoid offending foods, start probiotics. Do not eat large meals in late evening and consider raising head of bed.    Atypical chest pain No recent episodes

## 2015-01-08 NOTE — Assessment & Plan Note (Signed)
No recent exacerbations. Continue current meds

## 2015-01-08 NOTE — Assessment & Plan Note (Signed)
Well controlled, no changes to meds. Encouraged heart healthy diet such as the DASH diet and exercise as tolerated.  °

## 2015-01-09 ENCOUNTER — Other Ambulatory Visit: Payer: Self-pay | Admitting: Family Medicine

## 2015-01-09 MED ORDER — MONTELUKAST SODIUM 10 MG PO TABS: 10.0000 mg | ORAL_TABLET | Freq: Every day | ORAL | Status: DC

## 2015-04-10 ENCOUNTER — Ambulatory Visit (INDEPENDENT_AMBULATORY_CARE_PROVIDER_SITE_OTHER): Payer: Commercial Managed Care - HMO | Admitting: Family Medicine

## 2015-04-10 ENCOUNTER — Encounter: Payer: Self-pay | Admitting: Family Medicine

## 2015-04-10 VITALS — BP 152/82 | HR 89 | Temp 98.6°F | Ht 67.0 in | Wt 151.1 lb

## 2015-04-10 DIAGNOSIS — I10 Essential (primary) hypertension: Secondary | ICD-10-CM

## 2015-04-10 DIAGNOSIS — H00013 Hordeolum externum right eye, unspecified eyelid: Secondary | ICD-10-CM

## 2015-04-10 DIAGNOSIS — J019 Acute sinusitis, unspecified: Secondary | ICD-10-CM

## 2015-04-10 DIAGNOSIS — J209 Acute bronchitis, unspecified: Secondary | ICD-10-CM | POA: Diagnosis not present

## 2015-04-10 LAB — COMPREHENSIVE METABOLIC PANEL
ALT: 18 U/L (ref 0–53)
AST: 21 U/L (ref 0–37)
Albumin: 3.7 g/dL (ref 3.5–5.2)
Alkaline Phosphatase: 63 U/L (ref 39–117)
BUN: 25 mg/dL — ABNORMAL HIGH (ref 6–23)
CO2: 26 mEq/L (ref 19–32)
Calcium: 9.3 mg/dL (ref 8.4–10.5)
Chloride: 106 mEq/L (ref 96–112)
Creatinine, Ser: 0.74 mg/dL (ref 0.40–1.50)
GFR: 111.65 mL/min (ref >60.00)
Glucose, Bld: 97 mg/dL (ref 70–99)
Potassium: 3.8 mEq/L (ref 3.5–5.1)
Sodium: 140 mEq/L (ref 135–145)
Total Bilirubin: 0.6 mg/dL (ref 0.2–1.2)
Total Protein: 7 g/dL (ref 6.0–8.3)

## 2015-04-10 LAB — CBC
HCT: 40.7 % (ref 39.0–52.0)
Hemoglobin: 13.5 g/dL (ref 13.0–17.0)
MCHC: 33.3 g/dL (ref 30.0–36.0)
MCV: 92.2 fl (ref 78.0–100.0)
Platelets: 283 10*3/uL (ref 150.0–400.0)
RBC: 4.41 Mil/uL (ref 4.22–5.81)
RDW: 13.4 % (ref 11.5–15.5)
WBC: 7.1 10*3/uL (ref 4.0–10.5)

## 2015-04-10 MED ORDER — PREDNISONE 20 MG PO TABS: ORAL_TABLET | ORAL | Status: DC

## 2015-04-10 MED ORDER — CEFDINIR 300 MG PO CAPS: 300.0000 mg | ORAL_CAPSULE | Freq: Two times a day (BID) | ORAL | Status: DC

## 2015-04-10 MED ORDER — AMLODIPINE BESYLATE 5 MG PO TABS: 5.0000 mg | ORAL_TABLET | Freq: Every day | ORAL | Status: DC

## 2015-04-10 MED ORDER — LOSARTAN POTASSIUM 100 MG PO TABS: 50.0000 mg | ORAL_TABLET | Freq: Every day | ORAL | Status: DC

## 2015-04-10 MED ORDER — BENZONATATE 100 MG PO CAPS: 100.0000 mg | ORAL_CAPSULE | Freq: Three times a day (TID) | ORAL | Status: DC | PRN

## 2015-04-10 NOTE — Progress Notes (Signed)
Pre visit review using our clinic review tool, if applicable. No additional management support is needed unless otherwise documented below in the visit note. 

## 2015-04-10 NOTE — Progress Notes (Signed)
[name]  [id number] 03-Jul-1946 04/10/2015      Progress Note-Follow Up  Subjective  Chief Complaint  Chief Complaint  Patient presents with  . Sinusitis  . Cough  . Wheezing    HPI  Patient is a 69 y.o. male in today for several concerns. Notes an irritated lesion on his right eyelid for several days. Has been growing on his upper lid for several days, has not tried any treatments so far. No visual changes or discharge. Also notes sinus congestion, pressure, mlaaise, myalgias. Has had symptoms for a couple of weeks. With head and chest congestion with sob, cough and nasal drainage with yellow sputum. Has some weakness and sore thraot as well. Denies CP/palp/SOB/HA/fevers/GI or GU c/o. Taking meds as prescribed  Past Medical History  Diagnosis Date  . Allergic rhinitis   . COPD (chronic obstructive pulmonary disease)     FeV1 64%-2007  . GERD (gastroesophageal reflux disease)   . History of lung abscess     bronchiectasis with RMLandRLL ersection -1996- Dr Arlyce Dice  . Hypertension   . Osteoarthritis     hands and knees  . Emphysema   . Impaired vision     glasses  . Back pain 01/12/2013  . Atypical chest pain 12/01/2014    Past Surgical History  Procedure Laterality Date  . Lung surgery      RML andRUL removed due to bleeding and bronchiectasis and lung abcess  . Neck surgery  05/2008  . Lower back surgery  09/2008    06/2009  . Hernia repair  10/2009  . Left knee    . Right foot surgery      Family History  Problem Relation Age of Onset  . Prostate cancer Father     father died prostate ca  . Coronary artery disease      1st degree relative<60  . Stroke      1st degree relative<50  . Heart failure Mother     age 70  . Colon cancer Paternal Grandmother     History   Social History  . Marital Status: Married    Spouse Name: N/A  . Number of Children: N/A  . Years of Education: N/A   Occupational History  . retired Music therapist    Social  History Main Topics  . Smoking status: Former Smoker -- 1.50 packs/day for 38 years    Types: Cigarettes    Quit date: 09/03/2000  . Smokeless tobacco: Never Used  . Alcohol Use: 1.2 - 1.8 oz/week    2-3 Cans of beer per week  . Drug Use: No  . Sexual Activity: No   Other Topics Concern  . Not on [name]   Social History Narrative   Retired Music therapist   Patient states former smoker. 1 1/2 ppd x 38 yrs  Quit in Jan 2002   Married - 2 weeks (4th marriage)   divorced,  remarried 84-2004 (lost wife to lung ca),  remarried (divorced),    1 son  - 37 [name])   Alcohol use-yes (2-3 beers per week)            Current Outpatient Prescriptions on [name] Prior to Visit  Medication Sig Dispense Refill  . albuterol (PROVENTIL HFA;VENTOLIN HFA) 108 (90 BASE) MCG/ACT inhaler Inhale 2 puffs into the lungs every 6 (six) hours as needed for wheezing or shortness of breath. 3 Inhaler 3  . aspirin-acetaminophen-caffeine (EXCEDRIN MIGRAINE) 716-967-89 MG per tablet Take 1  tablet by mouth as needed.     . budesonide-formoterol (SYMBICORT) 160-4.5 MCG/ACT inhaler Inhale 2 puffs into the lungs 2 (two) times daily. 3 Inhaler 3  . calcium carbonate (TUMS EX) 750 MG chewable tablet Chew 1 tablet by mouth 2 (two) times daily.      . diclofenac sodium (VOLTAREN) 1 % GEL Apply 1 application topically 4 (four) times daily as needed. 3 Tube 0  . fluticasone (FLONASE) 50 MCG/ACT nasal spray USE 2 SPRAYS IN EACH NOSTRIL ONE TIME DAILY 48 g 2  . glucosamine-chondroitin 500-400 MG tablet Take 1 tablet by mouth 2 (two) times daily.      Marland Kitchen guaiFENesin (MUCINEX) 600 MG 12 hr tablet Take 600 mg by mouth 2 (two) times daily.      Marland Kitchen loratadine (CLARITIN) 10 MG tablet Take 10 mg by mouth daily.    Marland Kitchen losartan (COZAAR) 100 MG tablet Take 0.5 tablets (50 mg total) by mouth 2 (two) times daily. 90 tablet 1  . meloxicam (MOBIC) 15 MG tablet Take 1 tablet (15 mg total) by mouth daily as needed for pain (with food). 90  tablet 1  . Menthol (ROBITUSSIN COUGH DROPS MT) Use as directed in the mouth or throat as needed.    . montelukast (SINGULAIR) 10 MG tablet Take 1 tablet (10 mg total) by mouth daily. 90 tablet 3  . Multiple Vitamin (MULTIVITAMIN) tablet Take 1 tablet by mouth 3 (three) times a week.      . traMADol (ULTRAM) 50 MG tablet Take 1 tablet (50 mg total) by mouth every 8 (eight) hours as needed. 40 tablet 0   No current facility-administered medications on [name] prior to visit.    No Known Allergies  Review of Systems  Review of Systems  Constitutional: Positive for malaise/fatigue. Negative for fever and chills.  HENT: Positive for congestion.   Eyes: Negative for discharge.  Respiratory: Positive for sputum production and shortness of breath.   Cardiovascular: Negative for chest pain, palpitations and leg swelling.  Gastrointestinal: Negative for nausea, abdominal pain and diarrhea.  Genitourinary: Negative for dysuria.  Musculoskeletal: Positive for myalgias. Negative for falls.  Skin: Negative for rash.  Neurological: Negative for loss of consciousness.  Endo/Heme/Allergies: Negative for polydipsia.  Psychiatric/Behavioral: Negative for depression and suicidal ideas. The patient is not nervous/anxious and does not have insomnia.     Objective  BP 152/82 mmHg  Pulse 89  Temp(Src) 98.6 F (37 C) (Oral)  Ht 5\' 7"  (1.702 m)  Wt 151 lb 2 oz (68.55 kg)  BMI 23.66 kg/m2  SpO2 96%  Physical Exam  Physical Exam  Constitutional: He is oriented to person, place, and time and well-developed, well-nourished, and in no distress. No distress.  HENT:  Head: Normocephalic and atraumatic.  Eyes: Conjunctivae are normal.  Neck: Neck supple. No thyromegaly present.  Cardiovascular: Normal rate, regular rhythm and normal heart sounds.   No murmur heard. Pulmonary/Chest: Effort normal and breath sounds normal. No respiratory distress.  Abdominal: He exhibits no distension and no mass. There  is no tenderness.  Musculoskeletal: He exhibits no edema.  Neurological: He is alert and oriented to person, place, and time.  Skin: Skin is warm.  Psychiatric: Memory, affect and judgment normal.    Lab Results  Component Value Date   TSH 0.93 07/22/2014   Lab Results  Component Value Date   WBC 6.1 07/22/2014   HGB 14.3 07/22/2014   HCT 43.9 07/22/2014   MCV 93.6 07/22/2014   PLT  239.0 07/22/2014   Lab Results  Component Value Date   CREATININE 0.9 08/10/2014   BUN 21 08/10/2014   NA 135 08/10/2014   K 4.5 08/10/2014   CL 101 08/10/2014   CO2 28 08/10/2014   Lab Results  Component Value Date   ALT 19 07/22/2014   AST 23 07/22/2014   ALKPHOS 73 07/22/2014   BILITOT 1.0 07/22/2014   Lab Results  Component Value Date   CHOL 163 07/15/2013   Lab Results  Component Value Date   HDL 48 07/22/2014   Lab Results  Component Value Date   LDLCALC 93 07/15/2013   Lab Results  Component Value Date   TRIG 64 07/22/2014   Lab Results  Component Value Date   CHOLHDL 3.1 07/22/2014     Assessment & Plan  Benign essential HTN Elevated with acute illness, minimize sodium and caffeine. Will monitor, he will report elevated numbers  Hordeolum externum (stye) Warm compresses, antibiotics and will need to see opthamology if persists  Sinusitis, acute Started on Cefdinir, Encouraged increased rest and hydration, add probiotics, zinc such as Coldeze or Xicam. Treat fevers as needed

## 2015-04-10 NOTE — Patient Instructions (Signed)
Sty A sty (hordeolum) is an infection of a gland in the eyelid located at the base of the eyelash. A sty may develop a white or yellow head of pus. It can be puffy (swollen). Usually, the sty will burst and pus will come out on its own. They do not leave lumps in the eyelid once they drain. A sty is often confused with another form of cyst of the eyelid called a chalazion. Chalazions occur within the eyelid and not on the edge where the bases of the eyelashes are. They often are red, sore and then form firm lumps in the eyelid. CAUSES   Germs (bacteria).  Lasting (chronic) eyelid inflammation. SYMPTOMS   Tenderness, redness and swelling along the edge of the eyelid at the base of the eyelashes.  Sometimes, there is a white or yellow head of pus. It may or may not drain. DIAGNOSIS  An ophthalmologist will be able to distinguish between a sty and a chalazion and treat the condition appropriately.  TREATMENT   Styes are typically treated with warm packs (compresses) until drainage occurs.  In rare cases, medicines that kill germs (antibiotics) may be prescribed. These antibiotics may be in the form of drops, cream or pills.  If a hard lump has formed, it is generally necessary to do a small incision and remove the hardened contents of the cyst in a minor surgical procedure done in the office.  In suspicious cases, your caregiver may send the contents of the cyst to the lab to be certain that it is not a rare, but dangerous form of cancer of the glands of the eyelid. HOME CARE INSTRUCTIONS   Wash your hands often and dry them with a clean towel. Avoid touching your eyelid. This may spread the infection to other parts of the eye.  Apply heat to your eyelid for 10 to 20 minutes, several times a day, to ease pain and help to heal it faster.  Do not squeeze the sty. Allow it to drain on its own. Wash your eyelid carefully 3 to 4 times per day to remove any pus. SEEK IMMEDIATE MEDICAL CARE IF:     Your eye becomes painful or puffy (swollen).  Your vision changes.  Your sty does not drain by itself within 3 days.  Your sty comes back within a short period of time, even with treatment.  You have redness (inflammation) around the eye.  You have a fever. Document Released: 05/29/2005 Document Revised: 11/11/2011 Document Reviewed: 12/03/2013 [facility] Patient Information 2015 [place], [place]. This information is not intended to replace advice given to you by your health care provider. Make sure you discuss any questions you have with your health care provider. Acute Bronchitis Bronchitis is inflammation of the airways that extend from the windpipe into the lungs (bronchi). The inflammation often causes mucus to develop. This leads to a cough, which is the most common symptom of bronchitis.  In acute bronchitis, the condition usually develops suddenly and goes away over time, usually in a couple weeks. Smoking, allergies, and asthma can make bronchitis worse. Repeated episodes of bronchitis may cause further lung problems.  CAUSES Acute bronchitis is most often caused by the same virus that causes a cold. The virus can spread from person to person (contagious) through coughing, sneezing, and touching contaminated objects. SIGNS AND SYMPTOMS   Cough.   Fever.   Coughing up mucus.   Body aches.   Chest congestion.   Chills.   Shortness of breath.  Sore throat.  DIAGNOSIS  Acute bronchitis is usually diagnosed through a physical exam. Your health care provider will also ask you questions about your medical history. Tests, such as chest X-rays, are sometimes done to rule out other conditions.  TREATMENT  Acute bronchitis usually goes away in a couple weeks. Oftentimes, no medical treatment is necessary. Medicines are sometimes given for relief of fever or cough. Antibiotic medicines are usually not needed but may be prescribed in certain situations. In some cases, an  inhaler may be recommended to help reduce shortness of breath and control the cough. A cool mist vaporizer may also be used to help thin bronchial secretions and make it easier to clear the chest.  HOME CARE INSTRUCTIONS  Get plenty of rest.   Drink enough fluids to keep your urine clear or pale yellow (unless you have a medical condition that requires fluid restriction). Increasing fluids may help thin your respiratory secretions (sputum) and reduce chest congestion, and it will prevent dehydration.   Take medicines only as directed by your health care provider.  If you were prescribed an antibiotic medicine, finish it all even if you start to feel better.  Avoid smoking and secondhand smoke. Exposure to cigarette smoke or irritating chemicals will make bronchitis worse. If you are a smoker, consider using nicotine gum or skin patches to help control withdrawal symptoms. Quitting smoking will help your lungs heal faster.   Reduce the chances of another bout of acute bronchitis by washing your hands frequently, avoiding people with cold symptoms, and trying not to touch your hands to your mouth, nose, or eyes.   Keep all follow-up visits as directed by your health care provider.  SEEK MEDICAL CARE IF: Your symptoms do not improve after 1 week of treatment.  SEEK IMMEDIATE MEDICAL CARE IF:  You develop an increased fever or chills.   You have chest pain.   You have severe shortness of breath.  You have bloody sputum.   You develop dehydration.  You faint or repeatedly feel like you are going to pass out.  You develop repeated vomiting.  You develop a severe headache. MAKE SURE YOU:   Understand these instructions.  Will watch your condition.  Will get help right away if you are not doing well or get worse. Document Released: 09/26/2004 Document Revised: 01/03/2014 Document Reviewed: 02/09/2013 [facility] Patient Information 2015 [place], [place]. This information is not  intended to replace advice given to you by your health care provider. Make sure you discuss any questions you have with your health care provider.

## 2015-04-23 ENCOUNTER — Encounter: Payer: Self-pay | Admitting: Family Medicine

## 2015-04-23 DIAGNOSIS — J0100 Acute maxillary sinusitis, unspecified: Secondary | ICD-10-CM | POA: Insufficient documentation

## 2015-04-23 DIAGNOSIS — I10 Essential (primary) hypertension: Secondary | ICD-10-CM | POA: Insufficient documentation

## 2015-04-23 DIAGNOSIS — J019 Acute sinusitis, unspecified: Secondary | ICD-10-CM

## 2015-04-23 DIAGNOSIS — J40 Bronchitis, not specified as acute or chronic: Secondary | ICD-10-CM | POA: Insufficient documentation

## 2015-04-23 DIAGNOSIS — H00019 Hordeolum externum unspecified eye, unspecified eyelid: Secondary | ICD-10-CM

## 2015-04-23 HISTORY — DX: Hordeolum externum unspecified eye, unspecified eyelid: H00.019

## 2015-04-23 HISTORY — DX: Acute sinusitis, unspecified: J01.90

## 2015-04-23 NOTE — Assessment & Plan Note (Signed)
Warm compresses, antibiotics and will need to see opthamology if persists

## 2015-04-23 NOTE — Assessment & Plan Note (Signed)
Elevated with acute illness, minimize sodium and caffeine. Will monitor, he will report elevated numbers

## 2015-04-23 NOTE — Assessment & Plan Note (Signed)
Started on Cefdinir, Encouraged increased rest and hydration, add probiotics, zinc such as Coldeze or Xicam. Treat fevers as needed

## 2015-04-24 ENCOUNTER — Ambulatory Visit (INDEPENDENT_AMBULATORY_CARE_PROVIDER_SITE_OTHER): Payer: Commercial Managed Care - HMO | Admitting: Physician Assistant

## 2015-04-24 ENCOUNTER — Encounter: Payer: Self-pay | Admitting: Physician Assistant

## 2015-04-24 ENCOUNTER — Ambulatory Visit (HOSPITAL_BASED_OUTPATIENT_CLINIC_OR_DEPARTMENT_OTHER)
Admission: RE | Admit: 2015-04-24 | Discharge: 2015-04-24 | Disposition: A | Discharge status: Home / Self Care | Payer: Commercial Managed Care - HMO | Source: Ambulatory Visit | Attending: Physician Assistant | Admitting: Physician Assistant

## 2015-04-24 VITALS — BP 132/74 | HR 89 | Temp 98.4°F | Resp 16 | Ht 67.0 in | Wt 149.0 lb

## 2015-04-24 DIAGNOSIS — R0989 Other specified symptoms and signs involving the circulatory and respiratory systems: Secondary | ICD-10-CM | POA: Diagnosis not present

## 2015-04-24 DIAGNOSIS — J019 Acute sinusitis, unspecified: Secondary | ICD-10-CM

## 2015-04-24 DIAGNOSIS — Z902 Acquired absence of lung [part of]: Secondary | ICD-10-CM | POA: Insufficient documentation

## 2015-04-24 DIAGNOSIS — B9689 Other specified bacterial agents as the cause of diseases classified elsewhere: Secondary | ICD-10-CM | POA: Insufficient documentation

## 2015-04-24 DIAGNOSIS — J441 Chronic obstructive pulmonary disease with (acute) exacerbation: Secondary | ICD-10-CM

## 2015-04-24 DIAGNOSIS — R05 Cough: Secondary | ICD-10-CM | POA: Diagnosis not present

## 2015-04-24 MED ORDER — DOXYCYCLINE HYCLATE 100 MG PO CAPS: 100.0000 mg | ORAL_CAPSULE | Freq: Two times a day (BID) | ORAL | Status: DC

## 2015-04-24 MED ORDER — ALBUTEROL SULFATE (2.5 MG/3ML) 0.083% IN NEBU: 2.5000 mg | INHALATION_SOLUTION | Freq: Once | RESPIRATORY_TRACT | Status: DC

## 2015-04-24 MED ORDER — PREDNISONE 10 MG PO TABS: ORAL_TABLET | ORAL | Status: DC

## 2015-04-24 NOTE — Progress Notes (Signed)
History of Present Illness: [name] is a 69 y.o. male who present to the clinic today complaining of 3 weeks of sinus pressure, sinus pain and nasal congestion. Patient endorses facial pain.  Patient denies chest pain, recent travel or sick contact.  Patient seen on 04/10/15 by PCP as started on Cefdinir for acute sinusitis. Has taken antibiotic as directed with some mild improvement in symptoms but symptoms have worsened. Endorses chest tightness and SOB due to PND and chronic COPD.   History: Past Medical History  Diagnosis Date  . Allergic rhinitis   . COPD (chronic obstructive pulmonary disease)     FeV1 64%-2007  . GERD (gastroesophageal reflux disease)   . History of lung abscess     bronchiectasis with RMLandRLL ersection -1996- Dr Arlyce Dice  . Hypertension   . Osteoarthritis     hands and knees  . Emphysema   . Impaired vision     glasses  . Back pain 01/12/2013  . Atypical chest pain 12/01/2014  . Hordeolum externum (stye) 04/23/2015    Right eye  . Sinusitis, acute 04/23/2015    Current outpatient prescriptions:  .  albuterol (PROVENTIL HFA;VENTOLIN HFA) 108 (90 BASE) MCG/ACT inhaler, Inhale 2 puffs into the lungs every 6 (six) hours as needed for wheezing or shortness of breath., Disp: 3 Inhaler, Rfl: 3 .  amLODipine (NORVASC) 5 MG tablet, Take 1 tablet (5 mg total) by mouth daily., Disp: 90 tablet, Rfl: 2 .  aspirin-acetaminophen-caffeine (EXCEDRIN MIGRAINE) 381-017-51 MG per tablet, Take 1 tablet by mouth as needed. , Disp: , Rfl:  .  budesonide-formoterol (SYMBICORT) 160-4.5 MCG/ACT inhaler, Inhale 2 puffs into the lungs 2 (two) times daily., Disp: 3 Inhaler, Rfl: 3 .  calcium carbonate (TUMS EX) 750 MG chewable tablet, Chew 1 tablet by mouth 2 (two) times daily.  , Disp: , Rfl:  .  diclofenac sodium (VOLTAREN) 1 % GEL, Apply 1 application topically 4 (four) times daily as needed., Disp: 3 Tube, Rfl: 0 .  fluticasone (FLONASE) 50 MCG/ACT nasal spray, USE 2 SPRAYS IN  EACH NOSTRIL ONE TIME DAILY, Disp: 48 g, Rfl: 2 .  glucosamine-chondroitin 500-400 MG tablet, Take 1 tablet by mouth 2 (two) times daily.  , Disp: , Rfl:  .  guaiFENesin (MUCINEX) 600 MG 12 hr tablet, Take 600 mg by mouth 2 (two) times daily. Using Extra at this time, Disp: , Rfl:  .  loratadine (CLARITIN) 10 MG tablet, Take 10 mg by mouth daily., Disp: , Rfl:  .  losartan (COZAAR) 100 MG tablet, Take 0.5 tablets (50 mg total) by mouth daily., Disp: 90 tablet, Rfl: 1 .  meloxicam (MOBIC) 15 MG tablet, Take 1 tablet (15 mg total) by mouth daily as needed for pain (with food)., Disp: 90 tablet, Rfl: 1 .  Menthol (ROBITUSSIN COUGH DROPS MT), Use as directed in the mouth or throat as needed., Disp: , Rfl:  .  montelukast (SINGULAIR) 10 MG tablet, Take 1 tablet (10 mg total) by mouth daily., Disp: 90 tablet, Rfl: 3 .  Multiple Vitamin (MULTIVITAMIN) tablet, Take 1 tablet by mouth 3 (three) times a week.  , Disp: , Rfl:  .  traMADol (ULTRAM) 50 MG tablet, Take 1 tablet (50 mg total) by mouth every 8 (eight) hours as needed., Disp: 40 tablet, Rfl: 0 .  doxycycline (VIBRAMYCIN) 100 MG capsule, Take 1 capsule (100 mg total) by mouth 2 (two) times daily., Disp: 20 capsule, Rfl: 0 .  predniSONE (DELTASONE) 10 MG tablet,  Take 4 tablets by mouth daily x 3 days, Then 3 tablets x 3 days, Then 2 tablets x 3 days, then 1 tablet x 3 days., Disp: 20 tablet, Rfl: 0  Current facility-administered medications:  .  albuterol (PROVENTIL) (2.5 MG/3ML) 0.083% nebulizer solution 2.5 mg, 2.5 mg, Nebulization, Once, Brunetta Jeans, PA-C No Known Allergies Family History  Problem Relation Age of Onset  . Prostate cancer Father     father died prostate ca  . Coronary artery disease      1st degree relative<60  . Stroke      1st degree relative<50  . Heart failure Mother     age 29  . Colon cancer Paternal Grandmother    Social History   Social History  . Marital Status: Married    Spouse Name: N/A  . Number of  Children: N/A  . Years of Education: N/A   Occupational History  . retired Music therapist    Social History Main Topics  . Smoking status: Former Smoker -- 1.50 packs/day for 38 years    Types: Cigarettes    Quit date: 09/03/2000  . Smokeless tobacco: Never Used  . Alcohol Use: 1.2 - 1.8 oz/week    2-3 Cans of beer per week  . Drug Use: No  . Sexual Activity: No   Other Topics Concern  . None   Social History Narrative   Retired Music therapist   Patient states former smoker. 1 1/2 ppd x 38 yrs  Quit in Jan 2002   Married - 2 weeks (4th marriage)   divorced,  remarried 84-2004 (lost wife to lung ca),  remarried (divorced),    1 son  - 36 [name])   Alcohol use-yes (2-3 beers per week)           Review of Systems: See HPI.  All other ROS are negative.  Physical Examination: BP 132/74 mmHg  Pulse 89  Temp(Src) 98.4 F (36.9 C) (Oral)  Resp 16  Ht 5\' 7"  (1.702 m)  Wt 149 lb (67.586 kg)  BMI 23.33 kg/m2  SpO2 98%  General appearance: alert, cooperative and appears stated age Head: Normocephalic, without obvious abnormality, atraumatic, sinuses tender to percussion Eyes: conjunctivae/corneas clear. PERRL, EOM's intact. Fundi benign. Ears: normal TM's and external ear canals both ears Nose: moderate congestion, turbinates swollen, sinus tenderness bilateral Throat: lips, mucosa, and tongue normal; teeth and gums normal Neck: no adenopathy, no carotid bruit, no JVD, supple, symmetrical, trachea midline and thyroid not enlarged, symmetric, no tenderness/mass/nodules Lungs: wheezing of lung bases bilaterally Chest wall: no tenderness  Assessment/Plan: Acute bacterial sinusitis Rx Doxycycline.  Increase fluids.  Rest.  Saline nasal spray.  Probiotic.  Mucinex as directed.  Humidifier in bedroom. Continue chronic medication regimen.  Call or return to clinic if symptoms are not improving.   COPD exacerbation Will obtain CXR. Albuterol neb given in office  with improvement. Steroid taper prescribed. Follow-up 1 week.

## 2015-04-24 NOTE — Progress Notes (Signed)
Pre visit review using our clinic review tool, if applicable. No additional management support is needed unless otherwise documented below in the visit note/SLS  

## 2015-04-24 NOTE — Assessment & Plan Note (Signed)
Will obtain CXR. Albuterol neb given in office with improvement. Steroid taper prescribed. Follow-up 1 week.

## 2015-04-24 NOTE — Patient Instructions (Signed)
Please continue medications as directed. Please take antibiotic and steroid pack as directed. Increase fluids. Plain Mucinex daily. Place a humidifier in the bedroom.  Please go downstairs for an x-ray. I will call you with your results. Follow-up 1 week. If anything worsens, return immediately or go to the ER.

## 2015-04-24 NOTE — Assessment & Plan Note (Signed)
Rx Doxycycline.  Increase fluids.  Rest.  Saline nasal spray.  Probiotic.  Mucinex as directed.  Humidifier in bedroom. Continue chronic medication regimen.  Call or return to clinic if symptoms are not improving.

## 2015-04-24 NOTE — Addendum Note (Signed)
Addended by: Raiford Noble on: 04/24/2015 10:56 AM   Modules accepted: Orders, SmartSet

## 2015-05-01 ENCOUNTER — Ambulatory Visit: Payer: Commercial Managed Care - HMO | Admitting: Physician Assistant

## 2015-05-02 ENCOUNTER — Encounter: Payer: Self-pay | Admitting: Physician Assistant

## 2015-05-02 ENCOUNTER — Ambulatory Visit (INDEPENDENT_AMBULATORY_CARE_PROVIDER_SITE_OTHER): Payer: Commercial Managed Care - HMO | Admitting: Physician Assistant

## 2015-05-02 VITALS — BP 154/82 | HR 88 | Temp 98.2°F | Resp 16 | Ht 67.0 in | Wt 152.0 lb

## 2015-05-02 DIAGNOSIS — J441 Chronic obstructive pulmonary disease with (acute) exacerbation: Secondary | ICD-10-CM

## 2015-05-02 NOTE — Progress Notes (Signed)
Patient presents to clinic today for follow-up of sinusitis and COPD exacerbation. Has been taking steroid taper and antibiotic as directed. Endorses breathing is much better. Denies residual wheeze. Sinus symptoms have all resolved per patient. Still with cough but is now dry. Denies fever, chills, SOB or chest pain.  Past Medical History  Diagnosis Date  . Allergic rhinitis   . COPD (chronic obstructive pulmonary disease)     FeV1 64%-2007  . GERD (gastroesophageal reflux disease)   . History of lung abscess     bronchiectasis with RMLandRLL ersection -1996- Dr Arlyce Dice  . Hypertension   . Osteoarthritis     hands and knees  . Emphysema   . Impaired vision     glasses  . Back pain 01/12/2013  . Atypical chest pain 12/01/2014  . Hordeolum externum (stye) 04/23/2015    Right eye  . Sinusitis, acute 04/23/2015    Current Outpatient Prescriptions on File Prior to Visit  Medication Sig Dispense Refill  . albuterol (PROVENTIL HFA;VENTOLIN HFA) 108 (90 BASE) MCG/ACT inhaler Inhale 2 puffs into the lungs every 6 (six) hours as needed for wheezing or shortness of breath. 3 Inhaler 3  . amLODipine (NORVASC) 5 MG tablet Take 1 tablet (5 mg total) by mouth daily. 90 tablet 2  . aspirin-acetaminophen-caffeine (EXCEDRIN MIGRAINE) 488-891-69 MG per tablet Take 1 tablet by mouth as needed.     . budesonide-formoterol (SYMBICORT) 160-4.5 MCG/ACT inhaler Inhale 2 puffs into the lungs 2 (two) times daily. 3 Inhaler 3  . calcium carbonate (TUMS EX) 750 MG chewable tablet Chew 1 tablet by mouth 2 (two) times daily.      . diclofenac sodium (VOLTAREN) 1 % GEL Apply 1 application topically 4 (four) times daily as needed. 3 Tube 0  . doxycycline (VIBRAMYCIN) 100 MG capsule Take 1 capsule (100 mg total) by mouth 2 (two) times daily. 20 capsule 0  . fluticasone (FLONASE) 50 MCG/ACT nasal spray USE 2 SPRAYS IN EACH NOSTRIL ONE TIME DAILY 48 g 2  . glucosamine-chondroitin 500-400 MG tablet Take 1 tablet by  mouth 2 (two) times daily.      Marland Kitchen guaiFENesin (MUCINEX) 600 MG 12 hr tablet Take 600 mg by mouth 2 (two) times daily. Using Extra at this time    . loratadine (CLARITIN) 10 MG tablet Take 10 mg by mouth daily.    Marland Kitchen losartan (COZAAR) 100 MG tablet Take 0.5 tablets (50 mg total) by mouth daily. 90 tablet 1  . meloxicam (MOBIC) 15 MG tablet Take 1 tablet (15 mg total) by mouth daily as needed for pain (with food). 90 tablet 1  . Menthol (ROBITUSSIN COUGH DROPS MT) Use as directed in the mouth or throat as needed.    . montelukast (SINGULAIR) 10 MG tablet Take 1 tablet (10 mg total) by mouth daily. 90 tablet 3  . Multiple Vitamin (MULTIVITAMIN) tablet Take 1 tablet by mouth 3 (three) times a week.      . predniSONE (DELTASONE) 10 MG tablet Take 4 tablets by mouth daily x 3 days, Then 3 tablets x 3 days, Then 2 tablets x 3 days, then 1 tablet x 3 days. 20 tablet 0  . traMADol (ULTRAM) 50 MG tablet Take 1 tablet (50 mg total) by mouth every 8 (eight) hours as needed. 40 tablet 0   Current Facility-Administered Medications on File Prior to Visit  Medication Dose Route Frequency Provider Last Rate Last Dose  . albuterol (PROVENTIL) (2.5 MG/3ML) 0.083% nebulizer solution 2.5 mg  2.5 mg Nebulization Once Brunetta Jeans, PA-C        No Known Allergies  Family History  Problem Relation Age of Onset  . Prostate cancer Father     father died prostate ca  . Coronary artery disease      1st degree relative<60  . Stroke      1st degree relative<50  . Heart failure Mother     age 41  . Colon cancer Paternal Grandmother     Social History   Social History  . Marital Status: Married    Spouse Name: N/A  . Number of Children: N/A  . Years of Education: N/A   Occupational History  . retired Music therapist    Social History Main Topics  . Smoking status: Former Smoker -- 1.50 packs/day for 38 years    Types: Cigarettes    Quit date: 09/03/2000  . Smokeless tobacco: Never Used  .  Alcohol Use: 1.2 - 1.8 oz/week    2-3 Cans of beer per week  . Drug Use: No  . Sexual Activity: No   Other Topics Concern  . None   Social History Narrative   Retired Music therapist   Patient states former smoker. 1 1/2 ppd x 38 yrs  Quit in Jan 2002   Married - 2 weeks (4th marriage)   divorced,  remarried 84-2004 (lost wife to lung ca),  remarried (divorced),    1 son  - 24 [name])   Alcohol use-yes (2-3 beers per week)            Review of Systems - See HPI.  All other ROS are negative.  BP 154/82 mmHg  Pulse 88  Temp(Src) 98.2 F (36.8 C) (Oral)  Resp 16  Ht _0  (1.702 m)  Wt 152 lb (68.947 kg)  BMI 23.80 kg/m2  SpO2 96%  Physical Exam  Constitutional: He is oriented to person, place, and time and well-developed, well-nourished, and in no distress.  HENT:  Head: Normocephalic and atraumatic.  Eyes: Conjunctivae are normal.  Cardiovascular: Normal rate, regular rhythm, normal heart sounds and intact distal pulses.   Pulmonary/Chest: Effort normal and breath sounds normal. No respiratory distress. He has no wheezes. He has no rales. He exhibits no tenderness.  Neurological: He is alert and oriented to person, place, and time.  Skin: Skin is warm and dry. No rash noted.  Psychiatric: Affect normal.  Vitals reviewed.   Recent Results (from the past 2160 hour(s))  CBC     Status: None   Collection Time: 04/10/15  1:54 PM  Result Value Ref Range   WBC 7.1 4.0 - 10.5 K/uL   RBC 4.41 4.22 - 5.81 Mil/uL   Platelets 283.0 150.0 - 400.0 K/uL   Hemoglobin 13.5 13.0 - 17.0 g/dL   HCT 40.7 39.0 - 52.0 %   MCV 92.2 78.0 - 100.0 fl   MCHC 33.3 30.0 - 36.0 g/dL   RDW 13.4 11.5 - 15.5 %  Comp Met (CMET)     Status: Abnormal   Collection Time: 04/10/15  1:54 PM  Result Value Ref Range   Sodium 140 135 - 145 mEq/L   Potassium 3.8 3.5 - 5.1 mEq/L   Chloride 106 96 - 112 mEq/L   CO2 26 19 - 32 mEq/L   Glucose, Bld 97 70 - 99 mg/dL   BUN 25 (H) 6 - 23 mg/dL    Creatinine, Ser 0.74 0.40 - 1.50 mg/dL   Total  Bilirubin 0.6 0.2 - 1.2 mg/dL   Alkaline Phosphatase 63 39 - 117 U/L   AST 21 0 - 37 U/L   ALT 18 0 - 53 U/L   Total Protein 7.0 6.0 - 8.3 g/dL   Albumin 3.7 3.5 - 5.2 g/dL   Calcium 9.3 8.4 - 10.5 mg/dL   GFR 111.65 >60.00 mL/min    Assessment/Plan: COPD exacerbation Lung exam without wheeze. Patient doing much better. Vitals are stable. Finish steroid taper. Continue inhalers. Patient also instructed to finish antibiotic as directed. Continue Mucinex. Follow-up PRN.

## 2015-05-02 NOTE — Progress Notes (Signed)
Pre visit review using our clinic review tool, if applicable. No additional management support is needed unless otherwise documented below in the visit note/SLS  

## 2015-05-02 NOTE — Patient Instructions (Signed)
I am glad you are feeling better. Your lungs sound much improved. Please finish the antibiotic and the steroid taper until all pills are gone. Stay well hydrated and rest. Mucinex-DM twice daily for congestion and cough.  Follow-up if symptoms are not continuing to resolve.

## 2015-05-02 NOTE — Assessment & Plan Note (Signed)
Lung exam without wheeze. Patient doing much better. Vitals are stable. Finish steroid taper. Continue inhalers. Patient also instructed to finish antibiotic as directed. Continue Mucinex. Follow-up PRN.

## 2015-05-25 ENCOUNTER — Encounter: Payer: Self-pay | Admitting: Adult Health

## 2015-05-25 ENCOUNTER — Ambulatory Visit (INDEPENDENT_AMBULATORY_CARE_PROVIDER_SITE_OTHER): Payer: Commercial Managed Care - HMO | Admitting: Adult Health

## 2015-05-25 VITALS — BP 132/86 | HR 78 | Temp 98.7°F | Ht 68.0 in | Wt 148.0 lb

## 2015-05-25 DIAGNOSIS — Z23 Encounter for immunization: Secondary | ICD-10-CM | POA: Diagnosis not present

## 2015-05-25 DIAGNOSIS — J329 Chronic sinusitis, unspecified: Secondary | ICD-10-CM

## 2015-05-25 DIAGNOSIS — J449 Chronic obstructive pulmonary disease, unspecified: Secondary | ICD-10-CM | POA: Diagnosis not present

## 2015-05-25 NOTE — Patient Instructions (Signed)
Continue on current regimen  Flu shot today  Follow up Dr. Elsworth Soho  In 1 year at [facility] office

## 2015-05-25 NOTE — Progress Notes (Signed)
   Subjective:    Patient ID: MONTRAIL MEHRER, male    DOB: [birth year], 69 y.o.   MRN: [id number]  HPI 69 yo male former smoker with COPD  (Spirometry FEV1 53%, ratio 54-2014) Lung abscess in 1996 s/p RML resection   05/25/15 Follow up : COPD (former Dr. Joya Gaskins  Pt)  Pt returns for 1 year follow up .  Pt states breathing is doing well and has no new complaints at this time. Remains on Symbicort Twice daily  .  Takes claritin and singulair .  Needs flu shot.  Denies chest pain, orthopnea , hemoptysis , or edema.  CXR in 04/2015 with chronic changes.  Had COPD flare and sinusitis 1 month ago , seen by PCP tx w/ abx . Says sx resolved and back to baseline.     Review of Systems Constitutional:   No  weight loss, night sweats,  Fevers, chills, fatigue, or  lassitude.  HEENT:   No headaches,  Difficulty swallowing,  Tooth/dental problems, or  Sore throat,                No sneezing, itching, ear ache, nasal congestion, post nasal drip,   CV:  No chest pain,  Orthopnea, PND, swelling in lower extremities, anasarca, dizziness, palpitations, syncope.   GI  No heartburn, indigestion, abdominal pain, nausea, vomiting, diarrhea, change in bowel habits, loss of appetite, bloody stools.   Resp:   No excess mucus, no productive cough,  No non-productive cough,  No coughing up of blood.  No change in color of mucus.  No wheezing.  No chest wall deformity  Skin: no rash or lesions.  GU: no dysuria, change in color of urine, no urgency or frequency.  No flank pain, no hematuria   MS:  No joint pain or swelling.  No decreased range of motion.  No back pain.  Psych:  No change in mood or affect. No depression or anxiety.  No memory loss.         Objective:   Physical Exam GEN: A/Ox3; pleasant , NAD, well nourished   HEENT:  [place]/AT,  EACs-clear, TMs-wnl, NOSE-clear, THROAT-clear, no lesions, no postnasal drip or exudate noted.   NECK:  Supple w/ fair ROM; no JVD; normal carotid impulses w/o  bruits; no thyromegaly or nodules palpated; no lymphadenopathy.  RESP  Decreased bs in bases .no accessory muscle use, no dullness to percussion  CARD:  RRR, no m/r/g  , no peripheral edema, pulses intact, no cyanosis or clubbing.  GI:   Soft & nt; nml bowel sounds; no organomegaly or masses detected.  Musco: Warm bil, no deformities or joint swelling noted.   Neuro: alert, no focal deficits noted.    Skin: Warm, no lesions or rashes         Assessment & Plan:

## 2015-05-29 NOTE — Assessment & Plan Note (Signed)
Doing well on current regimen   Plan  Continue on current regimen  Flu shot today  Follow up Dr. Elsworth Soho  In 1 year at [facility] office

## 2015-05-29 NOTE — Assessment & Plan Note (Signed)
Recent flare now resolved Cont on claritin and singulair

## 2015-06-19 ENCOUNTER — Other Ambulatory Visit: Payer: Self-pay | Admitting: Critical Care Medicine

## 2015-06-19 ENCOUNTER — Other Ambulatory Visit: Payer: Self-pay | Admitting: Family Medicine

## 2015-06-30 ENCOUNTER — Ambulatory Visit: Payer: Commercial Managed Care - HMO | Admitting: Family Medicine

## 2015-07-10 ENCOUNTER — Telehealth: Payer: Self-pay | Admitting: Pulmonary Disease

## 2015-07-10 NOTE — Telephone Encounter (Signed)
Called spoke with pt. He wanted to know the last time we had refilled his ysmbicort. Made pt aware our records show 08/2014 #3 x 3 refills. He did not need this sent. He needed nothing further

## 2015-07-26 ENCOUNTER — Ambulatory Visit (HOSPITAL_BASED_OUTPATIENT_CLINIC_OR_DEPARTMENT_OTHER)
Admission: RE | Admit: 2015-07-26 | Discharge: 2015-07-26 | Disposition: A | Discharge status: Home / Self Care | Payer: Commercial Managed Care - HMO | Source: Ambulatory Visit | Attending: Physician Assistant | Admitting: Physician Assistant

## 2015-07-26 ENCOUNTER — Ambulatory Visit (INDEPENDENT_AMBULATORY_CARE_PROVIDER_SITE_OTHER): Payer: Commercial Managed Care - HMO | Admitting: Physician Assistant

## 2015-07-26 ENCOUNTER — Encounter: Payer: Self-pay | Admitting: Physician Assistant

## 2015-07-26 VITALS — BP 140/85 | HR 68 | Temp 98.0°F | Resp 16 | Ht 68.0 in | Wt 153.5 lb

## 2015-07-26 DIAGNOSIS — Z125 Encounter for screening for malignant neoplasm of prostate: Secondary | ICD-10-CM

## 2015-07-26 DIAGNOSIS — M25552 Pain in left hip: Secondary | ICD-10-CM | POA: Diagnosis not present

## 2015-07-26 DIAGNOSIS — Z1159 Encounter for screening for other viral diseases: Secondary | ICD-10-CM | POA: Diagnosis not present

## 2015-07-26 DIAGNOSIS — I10 Essential (primary) hypertension: Secondary | ICD-10-CM

## 2015-07-26 DIAGNOSIS — M1990 Unspecified osteoarthritis, unspecified site: Secondary | ICD-10-CM

## 2015-07-26 DIAGNOSIS — J449 Chronic obstructive pulmonary disease, unspecified: Secondary | ICD-10-CM

## 2015-07-26 DIAGNOSIS — Z0000 Encounter for general adult medical examination without abnormal findings: Secondary | ICD-10-CM

## 2015-07-26 LAB — LIPID PANEL
Cholesterol: 159 mg/dL (ref 0–200)
HDL: 58.7 mg/dL (ref >39.00)
LDL Cholesterol: 86 mg/dL (ref 0–99)
NonHDL: 100.27
Total CHOL/HDL Ratio: 3
Triglycerides: 73 mg/dL (ref 0.0–149.0)
VLDL: 14.6 mg/dL (ref 0.0–40.0)

## 2015-07-26 LAB — COMPREHENSIVE METABOLIC PANEL
ALT: 19 U/L (ref 0–53)
AST: 24 U/L (ref 0–37)
Albumin: 3.9 g/dL (ref 3.5–5.2)
Alkaline Phosphatase: 67 U/L (ref 39–117)
BUN: 23 mg/dL (ref 6–23)
CO2: 27 mEq/L (ref 19–32)
Calcium: 9.5 mg/dL (ref 8.4–10.5)
Chloride: 106 mEq/L (ref 96–112)
Creatinine, Ser: 0.83 mg/dL (ref 0.40–1.50)
GFR: 97.72 mL/min (ref >60.00)
Glucose, Bld: 96 mg/dL (ref 70–99)
Potassium: 4.3 mEq/L (ref 3.5–5.1)
Sodium: 139 mEq/L (ref 135–145)
Total Bilirubin: 1.1 mg/dL (ref 0.2–1.2)
Total Protein: 6.7 g/dL (ref 6.0–8.3)

## 2015-07-26 LAB — URINALYSIS, ROUTINE W REFLEX MICROSCOPIC
Bilirubin Urine: NEGATIVE
Hgb urine dipstick: NEGATIVE
Ketones, ur: NEGATIVE
Leukocytes, UA: NEGATIVE
Nitrite: NEGATIVE
RBC / HPF: NONE SEEN (ref 0–?)
Specific Gravity, Urine: 1.025 (ref 1.000–1.030)
Total Protein, Urine: NEGATIVE
Urine Glucose: NEGATIVE
Urobilinogen, UA: 0.2 (ref 0.0–1.0)
WBC, UA: NONE SEEN (ref 0–?)
pH: 6.5 (ref 5.0–8.0)

## 2015-07-26 LAB — PSA, MEDICARE: PSA: 2.06 ng/ml (ref 0.10–4.00)

## 2015-07-26 MED ORDER — BUDESONIDE-FORMOTEROL FUMARATE 160-4.5 MCG/ACT IN AERO: 2.0000 | INHALATION_SPRAY | Freq: Two times a day (BID) | RESPIRATORY_TRACT | Status: DC

## 2015-07-26 NOTE — Progress Notes (Signed)
Pre visit review using our clinic review tool, if applicable. No additional management support is needed unless otherwise documented below in the visit note/SLS  

## 2015-07-26 NOTE — Progress Notes (Signed)
Subjective:    [name] is a 69 y.o. male who presents for Medicare Annual/Subsequent preventive examination.   Preventive Screening-Counseling & Management  Tobacco History  Smoking status  . Former Smoker -- 1.50 packs/day for 38 years  . Types: Cigarettes  . Quit date: 09/03/2000  Smokeless tobacco  . Never Used    Problems Prior to Visit 1. Patient complains of intermittent pain of left hip radiating into lower leg over the past month. Notes sometimes there is a similar pain in R hip. Has significant history of OA. Denies trauma or injury. Has not taken anything for symptoms.  Current Problems (verified) Patient Active Problem List   Diagnosis Date Noted  . COPD exacerbation ([place]) 04/24/2015  . Hordeolum externum (stye) 04/23/2015  . Sinusitis, acute 04/23/2015  . Atypical chest pain 12/01/2014  . Screening for ischemic heart disease 07/25/2014  . Prostate cancer screening 07/25/2014  . Medicare annual wellness visit, subsequent 07/25/2014  . Asymptomatic PVCs 07/25/2014  . Arthritis 10/18/2013  . Incomplete RBBB 07/19/2013  . Back pain 01/12/2013  . Annual physical exam 07/21/2011  . Carotid bruit 07/21/2011  . Benign essential HTN 05/23/2010  . Sinusitis, chronic 07/07/2007  . Allergic rhinitis 07/07/2007  . COPD (chronic obstructive pulmonary disease) ([place]) 07/07/2007  . GERD 07/07/2007    Medications Prior to Visit Current Outpatient Prescriptions on File Prior to Visit  Medication Sig Dispense Refill  . albuterol (PROVENTIL HFA;VENTOLIN HFA) 108 (90 BASE) MCG/ACT inhaler Inhale 2 puffs into the lungs every 6 (six) hours as needed for wheezing or shortness of breath. 3 Inhaler 3  . amLODipine (NORVASC) 5 MG tablet Take 1 tablet (5 mg total) by mouth daily. 90 tablet 2  . aspirin-acetaminophen-caffeine (EXCEDRIN MIGRAINE) [id number] MG per tablet Take 1 tablet by mouth as needed.     . budesonide-formoterol (SYMBICORT) 160-4.5 MCG/ACT inhaler Inhale 2  puffs into the lungs 2 (two) times daily. 3 Inhaler 3  . calcium carbonate (TUMS EX) 750 MG chewable tablet Chew 1 tablet by mouth 2 (two) times daily.      . diclofenac sodium (VOLTAREN) 1 % GEL Apply 1 application topically 4 (four) times daily as needed. 3 Tube 0  . fluticasone (FLONASE) 50 MCG/ACT nasal spray USE 2 SPRAYS IN EACH NOSTRIL ONE TIME DAILY 48 g 2  . glucosamine-chondroitin 500-400 MG tablet Take 1 tablet by mouth 2 (two) times daily.      Marland Kitchen guaiFENesin (MUCINEX) 600 MG 12 hr tablet Take 600 mg by mouth 2 (two) times daily. Using Extra at this time    . loratadine (CLARITIN) 10 MG tablet Take 10 mg by mouth daily.    Marland Kitchen losartan (COZAAR) 100 MG tablet Take 0.5 tablets (50 mg total) by mouth daily. 90 tablet 1  . meloxicam (MOBIC) 15 MG tablet TAKE 1 TABLET (15 MG TOTAL) BY MOUTH DAILY AS NEEDED FOR PAIN (WITH FOOD). 90 tablet 1  . Menthol (ROBITUSSIN COUGH DROPS MT) Use as directed in the mouth or throat as needed.    . montelukast (SINGULAIR) 10 MG tablet Take 1 tablet (10 mg total) by mouth daily. 90 tablet 3  . Multiple Vitamin (MULTIVITAMIN) tablet Take 1 tablet by mouth 3 (three) times a week.      . traMADol (ULTRAM) 50 MG tablet Take 1 tablet (50 mg total) by mouth every 8 (eight) hours as needed. 40 tablet 0   No current facility-administered medications on file prior to visit.    Current Medications (verified)  Current Outpatient Prescriptions  Medication Sig Dispense Refill  . albuterol (PROVENTIL HFA;VENTOLIN HFA) 108 (90 BASE) MCG/ACT inhaler Inhale 2 puffs into the lungs every 6 (six) hours as needed for wheezing or shortness of breath. 3 Inhaler 3  . amLODipine (NORVASC) 5 MG tablet Take 1 tablet (5 mg total) by mouth daily. 90 tablet 2  . aspirin-acetaminophen-caffeine (EXCEDRIN MIGRAINE) [id number] MG per tablet Take 1 tablet by mouth as needed.     . budesonide-formoterol (SYMBICORT) 160-4.5 MCG/ACT inhaler Inhale 2 puffs into the lungs 2 (two) times daily. 3  Inhaler 3  . calcium carbonate (TUMS EX) 750 MG chewable tablet Chew 1 tablet by mouth 2 (two) times daily.      . diclofenac sodium (VOLTAREN) 1 % GEL Apply 1 application topically 4 (four) times daily as needed. 3 Tube 0  . fluticasone (FLONASE) 50 MCG/ACT nasal spray USE 2 SPRAYS IN EACH NOSTRIL ONE TIME DAILY 48 g 2  . glucosamine-chondroitin 500-400 MG tablet Take 1 tablet by mouth 2 (two) times daily.      Marland Kitchen guaiFENesin (MUCINEX) 600 MG 12 hr tablet Take 600 mg by mouth 2 (two) times daily. Using Extra at this time    . loratadine (CLARITIN) 10 MG tablet Take 10 mg by mouth daily.    Marland Kitchen losartan (COZAAR) 100 MG tablet Take 0.5 tablets (50 mg total) by mouth daily. 90 tablet 1  . meloxicam (MOBIC) 15 MG tablet TAKE 1 TABLET (15 MG TOTAL) BY MOUTH DAILY AS NEEDED FOR PAIN (WITH FOOD). 90 tablet 1  . Menthol (ROBITUSSIN COUGH DROPS MT) Use as directed in the mouth or throat as needed.    . montelukast (SINGULAIR) 10 MG tablet Take 1 tablet (10 mg total) by mouth daily. 90 tablet 3  . Multiple Vitamin (MULTIVITAMIN) tablet Take 1 tablet by mouth 3 (three) times a week.      . traMADol (ULTRAM) 50 MG tablet Take 1 tablet (50 mg total) by mouth every 8 (eight) hours as needed. 40 tablet 0   No current facility-administered medications for this visit.     Allergies (verified) Review of patient's allergies indicates no known allergies.   PAST HISTORY  Family History Family History  Problem Relation Age of Onset  . Prostate cancer Father     father died prostate ca  . Coronary artery disease      1st degree relative<60  . Stroke      1st degree relative<50  . Heart failure Mother     age 84  . Colon cancer Paternal Grandmother     Social History Social History  Substance Use Topics  . Smoking status: Former Smoker -- 1.50 packs/day for 38 years    Types: Cigarettes    Quit date: 09/03/2000  . Smokeless tobacco: Never Used  . Alcohol Use: 1.2 - 1.8 oz/week    2-3 Cans of beer  per week    Are there smokers in your home (other than you)?  No  Risk Factors Current exercise habits: Home exercise routine includes calisthenics, stretching and walking 1 hrs per day.  Dietary issues discussed: Body mass index is 23.34 kg/(m^2). Endorses well-balanced diet.   Cardiac risk factors: advanced age (older than 67 for men, 47 for women), dyslipidemia, hypertension and male gender.  Depression Screen (Note: if answer to either of the following is "Yes", a more complete depression screening is indicated)   Q1: Over the past two weeks, have you felt down, depressed or hopeless? No  Q2: Over the past two weeks, have you felt little interest or pleasure in doing things? No  Have you lost interest or pleasure in daily life? No  Do you often feel hopeless? No  Do you cry easily over simple problems? No  Activities of Daily Living In your present state of health, do you have any difficulty performing the following activities?:  Driving? No Managing money?  No Feeding yourself? No Getting from bed to chair? No Climbing a flight of stairs? No Preparing food and eating?: No Bathing or showering? No Getting dressed: No Getting to the toilet? No Using the toilet:No Moving around from place to place: No In the past year have you fallen or had a near fall?:No   Are you sexually active?  Yes  Do you have more than one partner?  No  Hearing Difficulties: No Do you often ask people to speak up or repeat themselves? No Do you experience ringing or noises in your ears? No Do you have difficulty understanding soft or whispered voices? No   Do you feel that you have a problem with memory? No  Do you often misplace items? No  Do you feel safe at home?  Yes  Cognitive Testing  Alert? Yes  Normal Appearance?Yes  Oriented to person? Yes  Place? Yes   Time? Yes  Recall of three objects?  Yes  Can perform simple calculations? Yes  Displays appropriate judgment?Yes  Can read the  correct time from a watch face?Yes   Advanced Directives have been discussed with the patient? Yes   List the Names of Other Physician/Practitioners you currently use: See EMR for list.  Indicate any recent Medical Services you may have received from other than [facility] providers in the past year (date may be approximate).  Immunization History  Administered Date(s) Administered  . Influenza Split 06/02/2012  . Influenza Whole 05/15/2010, 05/02/2011, 05/18/2013  . Influenza,inj,Quad PF,36+ Mos 05/14/2014, 05/25/2015  . Pneumococcal Conjugate-13 05/20/2013  . Pneumococcal Polysaccharide-23 07/03/2001, 05/02/2011  . Td 05/15/2004  . Tdap 02/17/2014  . Zoster 07/16/2011    Screening Tests Health Maintenance  Topic Date Due  . Hepatitis C Screening  31-Dec-1945  . INFLUENZA VACCINE  04/02/2016  . PNA vac Low Risk Adult (2 of 2 - PPSV23) 05/01/2016  . COLONOSCOPY  11/19/2020  . TETANUS/TDAP  02/18/2024  . ZOSTAVAX  Completed    All answers were reviewed with the patient and necessary referrals were made:  Leeanne Rio, PA-C   07/26/2015   History reviewed: allergies, current medications, past family history, past medical history, past social history, past surgical history and problem list  Review of Systems A comprehensive review of systems was negative.    Objective:     Blood pressure 140/85, pulse 68, temperature 98 F (36.7 C), temperature source Oral, resp. rate 16, height 5\' 8"  (1.727 m), weight 153 lb 8 oz (69.627 kg), SpO2 98 %. Body mass index is 23.34 kg/(m^2).  General appearance: alert, cooperative, appears stated age and no distress Head: Normocephalic, without obvious abnormality, atraumatic Eyes: conjunctivae/corneas clear. PERRL, EOM's intact. Fundi benign. Ears: normal TM's and external ear canals both ears Nose: Nares normal. Septum midline. Mucosa normal. No drainage or sinus tenderness. Throat: lips, mucosa, and tongue normal; teeth and gums  normal Lungs: clear to auscultation bilaterally Heart: regular rate and rhythm, S1, S2 normal, no murmur, click, rub or gallop Abdomen: soft, non-tender; bowel sounds normal; no masses,  no organomegaly Extremities: extremities normal, atraumatic, no  cyanosis or edema Pulses: 2+ and symmetric Skin: Skin color, texture, turgor normal. No rashes or lesions Lymph nodes: Cervical, supraclavicular, and axillary nodes normal.     Assessment:     (1) Medicare Wellness, Subsequent (2) Hypertension (3) COPD (4) Prostate Cancer Screening (5) Left hip pain     Plan:     (1) During the course of the visit the patient was educated and counseled about appropriate screening and preventive services including:    Screening electrocardiogram  Prostate cancer screening  Colorectal cancer screening  Nutrition counseling   Diet review for nutrition referral? Yes ____  Not Indicated _x___  (2) BP stable. Asymptomatic. Continue current regimen. (3) Followed by Pulmonology. Stable. Continue current regimen. Patient to follow-up with specialty for LFTs.  (4) Will obtain PSA today. (5) Will obtain x-ray to further assess. Supportive measures and pain medication discussed.   Patient Instructions (the written plan) was given to the patient.  Medicare Attestation I have personally reviewed: The patient's medical and social history Their use of alcohol, tobacco or illicit drugs Their current medications and supplements The patient's functional ability including ADLs,fall risks, home safety risks, cognitive, and hearing and visual impairment Diet and physical activities Evidence for depression or mood disorders  The patient's weight, height, BMI, and visual acuity have been recorded in the chart.  I have made referrals, counseling, and provided education to the patient based on review of the above and I have provided the patient with a written personalized care plan for preventive services.      Raiford Noble [place], [place]   07/26/2015

## 2015-07-26 NOTE — Patient Instructions (Signed)
Please go to the lab for blood work.  I will call you with your results. If your blood work is normal we will follow-up yearly for physicals.   Please continue medications as directed. Go downstairs for x-ray. I will call you with your results.  Preventive Care for Adults, Male A healthy lifestyle and preventive care can promote health and wellness. Preventive health guidelines for men include the following key practices:  A routine yearly physical is a good way to check with your health care provider about your health and preventative screening. It is a chance to share any concerns and updates on your health and to receive a thorough exam.  Visit your dentist for a routine exam and preventative care every 6 months. Brush your teeth twice a day and floss once a day. Good oral hygiene prevents tooth decay and gum disease.  The frequency of eye exams is based on your age, health, family medical history, use of contact lenses, and other factors. Follow your health care provider's recommendations for frequency of eye exams.  Eat a healthy diet. Foods such as vegetables, fruits, whole grains, low-fat dairy products, and lean protein foods contain the nutrients you need without too many calories. Decrease your intake of foods high in solid fats, added sugars, and salt. Eat the right amount of calories for you.Get information about a proper diet from your health care provider, if necessary.  Regular physical exercise is one of the most important things you can do for your health. Most adults should get at least 150 minutes of moderate-intensity exercise (any activity that increases your heart rate and causes you to sweat) each week. In addition, most adults need muscle-strengthening exercises on 2 or more days a week.  Maintain a healthy weight. The body mass index (BMI) is a screening tool to identify possible weight problems. It provides an estimate of body fat based on height and weight. Your health  care provider can find your BMI and can help you achieve or maintain a healthy weight.For adults 20 years and older:  A BMI below 18.5 is considered underweight.  A BMI of 18.5 to 24.9 is normal.  A BMI of 25 to 29.9 is considered overweight.  A BMI of 30 and above is considered obese.  Maintain normal blood lipids and cholesterol levels by exercising and minimizing your intake of saturated fat. Eat a balanced diet with plenty of fruit and vegetables. Blood tests for lipids and cholesterol should begin at age 48 and be repeated every 5 years. If your lipid or cholesterol levels are high, you are over 50, or you are at high risk for heart disease, you may need your cholesterol levels checked more frequently.Ongoing high lipid and cholesterol levels should be treated with medicines if diet and exercise are not working.  If you smoke, find out from your health care provider how to quit. If you do not use tobacco, do not start.  Lung cancer screening is recommended for adults aged 29-80 years who are at high risk for developing lung cancer because of a history of smoking. A yearly low-dose CT scan of the lungs is recommended for people who have at least a 30-pack-year history of smoking and are a current smoker or have quit within the past 15 years. A pack year of smoking is smoking an average of 1 pack of cigarettes a day for 1 year (for example: 1 pack a day for 30 years or 2 packs a day for 15 years).  Yearly screening should continue until the smoker has stopped smoking for at least 15 years. Yearly screening should be stopped for people who develop a health problem that would prevent them from having lung cancer treatment.  If you choose to drink alcohol, do not have more than 2 drinks per day. One drink is considered to be 12 ounces (355 mL) of beer, 5 ounces (148 mL) of wine, or 1.5 ounces (44 mL) of liquor.  Avoid use of street drugs. Do not share needles with anyone. Ask for help if you need  support or instructions about stopping the use of drugs.  High blood pressure causes heart disease and increases the risk of stroke. Your blood pressure should be checked at least every 1-2 years. Ongoing high blood pressure should be treated with medicines, if weight loss and exercise are not effective.  If you are 36-47 years old, ask your health care provider if you should take aspirin to prevent heart disease.  Diabetes screening is done by taking a blood sample to check your blood glucose level after you have not eaten for a certain period of time (fasting). If you are not overweight and you do not have risk factors for diabetes, you should be screened once every 3 years starting at age 61. If you are overweight or obese and you are 39-81 years of age, you should be screened for diabetes every year as part of your cardiovascular risk assessment.  Colorectal cancer can be detected and often prevented. Most routine colorectal cancer screening begins at the age of 52 and continues through age 55. However, your health care provider may recommend screening at an earlier age if you have risk factors for colon cancer. On a yearly basis, your health care provider may provide home test kits to check for hidden blood in the stool. Use of a small camera at the end of a tube to directly examine the colon (sigmoidoscopy or colonoscopy) can detect the earliest forms of colorectal cancer. Talk to your health care provider about this at age 40, when routine screening begins. Direct exam of the colon should be repeated every 5-10 years through age 44, unless early forms of precancerous polyps or small growths are found.  People who are at an increased risk for hepatitis B should be screened for this virus. You are considered at high risk for hepatitis B if:  You were born in a country where hepatitis B occurs often. Talk with your health care provider about which countries are considered high risk.  Your parents  were born in a high-risk country and you have not received a shot to protect against hepatitis B (hepatitis B vaccine).  You have HIV or AIDS.  You use needles to inject street drugs.  You live with, or have sex with, someone who has hepatitis B.  You are a man who has sex with other men (MSM).  You get hemodialysis treatment.  You take certain medicines for conditions such as cancer, organ transplantation, and autoimmune conditions.  Hepatitis C blood testing is recommended for all people born from [birth year] through 1965 and any individual with known risks for hepatitis C.  Practice safe sex. Use condoms and avoid high-risk sexual practices to reduce the spread of sexually transmitted infections (STIs). STIs include gonorrhea, chlamydia, syphilis, trichomonas, herpes, HPV, and human immunodeficiency virus (HIV). Herpes, HIV, and HPV are viral illnesses that have no cure. They can result in disability, cancer, and death.  If you are a man  who has sex with other men, you should be screened at least once per year for:  HIV.  Urethral, rectal, and pharyngeal infection of gonorrhea, chlamydia, or both.  If you are at risk of being infected with HIV, it is recommended that you take a prescription medicine daily to prevent HIV infection. This is called preexposure prophylaxis (PrEP). You are considered at risk if:  You are a man who has sex with other men (MSM) and have other risk factors.  You are a heterosexual man, are sexually active, and are at increased risk for HIV infection.  You take drugs by injection.  You are sexually active with a partner who has HIV.  Talk with your health care provider about whether you are at high risk of being infected with HIV. If you choose to begin PrEP, you should first be tested for HIV. You should then be tested every 3 months for as long as you are taking PrEP.  A one-time screening for abdominal aortic aneurysm (AAA) and surgical repair of large AAAs  by ultrasound are recommended for men ages 38 to 19 years who are current or former smokers.  Healthy men should no longer receive prostate-specific antigen (PSA) blood tests as part of routine cancer screening. Talk with your health care provider about prostate cancer screening.  Testicular cancer screening is not recommended for adult males who have no symptoms. Screening includes self-exam, a health care provider exam, and other screening tests. Consult with your health care provider about any symptoms you have or any concerns you have about testicular cancer.  Use sunscreen. Apply sunscreen liberally and repeatedly throughout the day. You should seek shade when your shadow is shorter than you. Protect yourself by wearing long sleeves, pants, a wide-brimmed hat, and sunglasses year round, whenever you are outdoors.  Once a month, do a whole-body skin exam, using a mirror to look at the skin on your back. Tell your health care provider about new moles, moles that have irregular borders, moles that are larger than a pencil eraser, or moles that have changed in shape or color.  Stay current with required vaccines (immunizations).  Influenza vaccine. All adults should be immunized every year.  Tetanus, diphtheria, and acellular pertussis (Td, Tdap) vaccine. An adult who has not previously received Tdap or who does not know his vaccine status should receive 1 dose of Tdap. This initial dose should be followed by tetanus and diphtheria toxoids (Td) booster doses every 10 years. Adults with an unknown or incomplete history of completing a 3-dose immunization series with Td-containing vaccines should begin or complete a primary immunization series including a Tdap dose. Adults should receive a Td booster every 10 years.  Varicella vaccine. An adult without evidence of immunity to varicella should receive 2 doses or a second dose if he has previously received 1 dose.  Human papillomavirus (HPV) vaccine.  Males aged 11-21 years who have not received the vaccine previously should receive the 3-dose series. Males aged 22-26 years may be immunized. Immunization is recommended through the age of 48 years for any male who has sex with males and did not get any or all doses earlier. Immunization is recommended for any person with an immunocompromised condition through the age of 22 years if he did not get any or all doses earlier. During the 3-dose series, the second dose should be obtained 4-8 weeks after the first dose. The third dose should be obtained 24 weeks after the first dose and 16  after the second dose.  Zoster vaccine. One dose is recommended for adults aged 60 years or older unless certain conditions are present.  Measles, mumps, and rubella (MMR) vaccine. Adults born before 1957 generally are considered immune to measles and mumps. Adults born in 1957 or later should have 1 or more doses of MMR vaccine unless there is a contraindication to the vaccine or there is laboratory evidence of immunity to each of the three diseases. A routine second dose of MMR vaccine should be obtained at least 28 days after the first dose for students attending postsecondary schools, health care workers, or international travelers. People who received inactivated measles vaccine or an unknown type of measles vaccine during 1963-1967 should receive 2 doses of MMR vaccine. People who received inactivated mumps vaccine or an unknown type of mumps vaccine before 1979 and are at high risk for mumps infection should consider immunization with 2 doses of MMR vaccine. Unvaccinated health care workers born before 1957 who lack laboratory evidence of measles, mumps, or rubella immunity or laboratory confirmation of disease should consider measles and mumps immunization with 2 doses of MMR vaccine or rubella immunization with 1 dose of MMR vaccine.  Pneumococcal 13-valent conjugate (PCV13) vaccine. When indicated, a person who is  uncertain of his immunization history and has no record of immunization should receive the PCV13 vaccine. All adults 65 years of age and older should receive this vaccine. An adult aged 19 years or older who has certain medical conditions and has not been previously immunized should receive 1 dose of PCV13 vaccine. This PCV13 should be followed with a dose of pneumococcal polysaccharide (PPSV23) vaccine. Adults who are at high risk for pneumococcal disease should obtain the PPSV23 vaccine at least 8 weeks after the dose of PCV13 vaccine. Adults older than 68 years of age who have normal immune system function should obtain the PPSV23 vaccine dose at least 1 year after the dose of PCV13 vaccine.  Pneumococcal polysaccharide (PPSV23) vaccine. When PCV13 is also indicated, PCV13 should be obtained first. All adults aged 65 years and older should be immunized. An adult younger than age 65 years who has certain medical conditions should be immunized. Any person who resides in a nursing home or long-term care facility should be immunized. An adult smoker should be immunized. People with an immunocompromised condition and certain other conditions should receive both PCV13 and PPSV23 vaccines. People with human immunodeficiency virus (HIV) infection should be immunized as soon as possible after diagnosis. Immunization during chemotherapy or radiation therapy should be avoided. Routine use of PPSV23 vaccine is not recommended for American Indians, [place] Natives, or people younger than 65 years unless there are medical conditions that require PPSV23 vaccine. When indicated, people who have unknown immunization and have no record of immunization should receive PPSV23 vaccine. One-time revaccination 5 years after the first dose of PPSV23 is recommended for people aged 19-64 years who have chronic kidney failure, nephrotic syndrome, asplenia, or immunocompromised conditions. People who received 1-2 doses of PPSV23 before age  65 years should receive another dose of PPSV23 vaccine at age 65 years or later if at least 5 years have passed since the previous dose. Doses of PPSV23 are not needed for people immunized with PPSV23 at or after age 65 years.  Meningococcal vaccine. Adults with asplenia or persistent complement component deficiencies should receive 2 doses of quadrivalent meningococcal conjugate (MenACWY-D) vaccine. The doses should be obtained at least 2 months apart. Microbiologists working with certain meningococcal   meningococcal bacteria, [employer] recruits, people at risk during an outbreak, and people who travel to or live in countries with a high rate of meningitis should be immunized. A first-year college student up through age 65 years who is living in a residence hall should receive a dose if he did not receive a dose on or after his 16th birthday. Adults who have certain high-risk conditions should receive one or more doses of vaccine.  Hepatitis A vaccine. Adults who wish to be protected from this disease, have chronic liver disease, work with hepatitis A-infected animals, work in hepatitis A research labs, or travel to or work in countries with a high rate of hepatitis A should be immunized. Adults who were previously unvaccinated and who anticipate close contact with an international adoptee during the first 60 days after arrival in the [place] from a country with a high rate of hepatitis A should be immunized.  Hepatitis B vaccine. Adults should be immunized if they wish to be protected from this disease, are under age 55 years and have diabetes, have chronic liver disease, have had more than one sex partner in the past 6 months, may be exposed to blood or other infectious body fluids, are household contacts or sex partners of hepatitis B positive people, are clients or workers in certain care facilities, or travel to or work in countries with a high rate of hepatitis B.  Haemophilus influenzae type b (Hib) vaccine. A  previously unvaccinated person with asplenia or sickle cell disease or having a scheduled splenectomy should receive 1 dose of Hib vaccine. Regardless of previous immunization, a recipient of a hematopoietic stem cell transplant should receive a 3-dose series 6-12 months after his successful transplant. Hib vaccine is not recommended for adults with HIV infection. Preventive Service / Frequency Ages 75 to 18  Blood pressure check.** / Every 3-5 years.  Lipid and cholesterol check.** / Every 5 years beginning at age 61.  Hepatitis C blood test.** / For any individual with known risks for hepatitis C.  Skin self-exam. / Monthly.  Influenza vaccine. / Every year.  Tetanus, diphtheria, and acellular pertussis (Tdap, Td) vaccine.** / Consult your health care provider. 1 dose of Td every 10 years.  Varicella vaccine.** / Consult your health care provider.  HPV vaccine. / 3 doses over 6 months, if 57 or younger.  Measles, mumps, rubella (MMR) vaccine.** / You need at least 1 dose of MMR if you were born in 1957 or later. You may also need a second dose.  Pneumococcal 13-valent conjugate (PCV13) vaccine.** / Consult your health care provider.  Pneumococcal polysaccharide (PPSV23) vaccine.** / 1 to 2 doses if you smoke cigarettes or if you have certain conditions.  Meningococcal vaccine.** / 1 dose if you are age 48 to 60 years and a Market researcher living in a residence hall, or have one of several medical conditions. You may also need additional booster doses.  Hepatitis A vaccine.** / Consult your health care provider.  Hepatitis B vaccine.** / Consult your health care provider.  Haemophilus influenzae type b (Hib) vaccine.** / Consult your health care provider. Ages 60 to 25  Blood pressure check.** / Every year.  Lipid and cholesterol check.** / Every 5 years beginning at age 8.  Lung cancer screening. / Every year if you are aged 8-80 years and have a 30-pack-year  history of smoking and currently smoke or have quit within the past 15 years. Yearly screening is stopped once you have  quit smoking for at least 15 years or develop a health problem that would prevent you from having lung cancer treatment.  Fecal occult blood test (FOBT) of stool. / Every year beginning at age 88 and continuing until age 15. You may not have to do this test if you get a colonoscopy every 10 years.  Flexible sigmoidoscopy** or colonoscopy.** / Every 5 years for a flexible sigmoidoscopy or every 10 years for a colonoscopy beginning at age 66 and continuing until age 27.  Hepatitis C blood test.** / For all people born from [birth year] through 1965 and any individual with known risks for hepatitis C.  Skin self-exam. / Monthly.  Influenza vaccine. / Every year.  Tetanus, diphtheria, and acellular pertussis (Tdap/Td) vaccine.** / Consult your health care provider. 1 dose of Td every 10 years.  Varicella vaccine.** / Consult your health care provider.  Zoster vaccine.** / 1 dose for adults aged 51 years or older.  Measles, mumps, rubella (MMR) vaccine.** / You need at least 1 dose of MMR if you were born in 1957 or later. You may also need a second dose.  Pneumococcal 13-valent conjugate (PCV13) vaccine.** / Consult your health care provider.  Pneumococcal polysaccharide (PPSV23) vaccine.** / 1 to 2 doses if you smoke cigarettes or if you have certain conditions.  Meningococcal vaccine.** / Consult your health care provider.  Hepatitis A vaccine.** / Consult your health care provider.  Hepatitis B vaccine.** / Consult your health care provider.  Haemophilus influenzae type b (Hib) vaccine.** / Consult your health care provider. Ages 59 and over  Blood pressure check.** / Every year.  Lipid and cholesterol check.**/ Every 5 years beginning at age 26.  Lung cancer screening. / Every year if you are aged 26-80 years and have a 30-pack-year history of smoking and currently  smoke or have quit within the past 15 years. Yearly screening is stopped once you have quit smoking for at least 15 years or develop a health problem that would prevent you from having lung cancer treatment.  Fecal occult blood test (FOBT) of stool. / Every year beginning at age 97 and continuing until age 34. You may not have to do this test if you get a colonoscopy every 10 years.  Flexible sigmoidoscopy** or colonoscopy.** / Every 5 years for a flexible sigmoidoscopy or every 10 years for a colonoscopy beginning at age 28 and continuing until age 7.  Hepatitis C blood test.** / For all people born from [birth year] through 1964 and any individual with known risks for hepatitis C.  Abdominal aortic aneurysm (AAA) screening.** / A one-time screening for ages 48 to 17 years who are current or former smokers.  Skin self-exam. / Monthly.  Influenza vaccine. / Every year.  Tetanus, diphtheria, and acellular pertussis (Tdap/Td) vaccine.** / 1 dose of Td every 10 years.  Varicella vaccine.** / Consult your health care provider.  Zoster vaccine.** / 1 dose for adults aged 38 years or older.  Pneumococcal 13-valent conjugate (PCV13) vaccine.** / 1 dose for all adults aged 79 years and older.  Pneumococcal polysaccharide (PPSV23) vaccine.** / 1 dose for all adults aged 45 years and older.  Meningococcal vaccine.** / Consult your health care provider.  Hepatitis A vaccine.** / Consult your health care provider.  Hepatitis B vaccine.** / Consult your health care provider.  Haemophilus influenzae type b (Hib) vaccine.** / Consult your health care provider. **Family history and personal history of risk and conditions may change your health care provider's recommendations.  This information is not intended to replace advice given to you by your health care provider. Make sure you discuss any questions you have with your health care provider.   Document Released: 10/15/2001 Document Revised:  09/09/2014 Document Reviewed: 01/14/2011 Elsevier Interactive Patient Education [employer].

## 2015-07-27 LAB — HEPATITIS C ANTIBODY: HCV Ab: NEGATIVE

## 2015-07-28 ENCOUNTER — Encounter: Payer: Self-pay | Admitting: Physician Assistant

## 2015-07-31 ENCOUNTER — Telehealth: Payer: Self-pay | Admitting: *Deleted

## 2015-07-31 DIAGNOSIS — M25552 Pain in left hip: Principal | ICD-10-CM

## 2015-07-31 DIAGNOSIS — G8929 Other chronic pain: Secondary | ICD-10-CM

## 2015-07-31 NOTE — Telephone Encounter (Signed)
Referral placed.

## 2015-07-31 NOTE — Telephone Encounter (Signed)
Spoke with patient and informed that provider suggest that patient be referred to Ortho for evaluation & assessment; pt formerly seen Dr. Hazle Coca with [facility] [Neurosurgery] who has retired. Patient understood and agreed to proceed forward with Ortho Referral/SLS

## 2015-07-31 NOTE — Telephone Encounter (Signed)
Notes Recorded by Rockwell Germany, CMA on 07/31/2015 at 4:08 PM Patient informed, understood & states that he has had PT twice since hip Sx w/o relief [and does not want co-pay each visit, if no resolve]/SLS  Notes Recorded by Brunetta Jeans, PA-C on 07/26/2015 at 1:31 PM X-ray negative so nerves most likely culprit of symptoms. Continue the position changing instructions given today (avoid prolonged sitting and leaning to that side while seated). If not improving may recommend PT assessment.   Details

## 2015-07-31 NOTE — Telephone Encounter (Signed)
-----   Message from Brunetta Jeans, PA-C sent at 07/26/2015  1:31 PM EST ----- X-ray negative so nerves most likely culprit of symptoms. Continue the position changing instructions given today (avoid prolonged sitting and leaning to that side while seated). If not improving may recommend PT assessment.

## 2015-08-08 DIAGNOSIS — M4800 Spinal stenosis, site unspecified: Secondary | ICD-10-CM | POA: Diagnosis not present

## 2015-09-03 ENCOUNTER — Other Ambulatory Visit: Payer: Self-pay | Admitting: Physician Assistant

## 2015-09-05 MED ORDER — BUDESONIDE-FORMOTEROL FUMARATE 160-4.5 MCG/ACT IN AERO: 2.0000 | INHALATION_SPRAY | Freq: Two times a day (BID) | RESPIRATORY_TRACT | Status: DC

## 2015-09-05 NOTE — Addendum Note (Signed)
Addended by: Dorrene German on: 09/05/2015 03:21 PM   Modules accepted: Orders

## 2015-09-05 NOTE — Telephone Encounter (Signed)
Rx filled to pharmacy as requested.

## 2015-12-18 ENCOUNTER — Ambulatory Visit (INDEPENDENT_AMBULATORY_CARE_PROVIDER_SITE_OTHER): Payer: Commercial Managed Care - HMO | Admitting: Physician Assistant

## 2015-12-18 ENCOUNTER — Encounter: Payer: Self-pay | Admitting: Physician Assistant

## 2015-12-18 VITALS — BP 132/82 | HR 82 | Temp 98.2°F | Ht 67.0 in | Wt 150.0 lb

## 2015-12-18 DIAGNOSIS — J209 Acute bronchitis, unspecified: Secondary | ICD-10-CM

## 2015-12-18 MED ORDER — PREDNISONE 10 MG PO TABS: ORAL_TABLET | ORAL | Status: DC

## 2015-12-18 MED ORDER — AZITHROMYCIN 250 MG PO TABS: ORAL_TABLET | ORAL | Status: DC

## 2015-12-18 MED FILL — AZITHROMYCIN 250 MG TABLET: 250 | 5 days supply | Qty: 6 | Fill #0

## 2015-12-18 MED FILL — predniSONE 10 MG TABS: 10 | 12 days supply | Qty: 30 | Fill #0

## 2015-12-18 NOTE — Progress Notes (Signed)
Patient presents to clinic today c/o several weeks of chest congestion, sinus congestion, with productive cough of green-brown sputum. Denies fever, chills, chest pain. Endorses increased wheezing and chest tightness. Patient with history of COPD, currently on Symbicort and taking as directed. Denies recent travel or sick contact. Denies using albuterol over the past few days.   Past Medical History  Diagnosis Date  . Allergic rhinitis   . COPD (chronic obstructive pulmonary disease) (HCC)     FeV1 64%-2007  . GERD (gastroesophageal reflux disease)   . History of lung abscess     bronchiectasis with RMLandRLL ersection -1996- Dr Arlyce Dice  . Hypertension   . Osteoarthritis     hands and knees  . Emphysema   . Impaired vision     glasses  . Back pain 01/12/2013  . Atypical chest pain 12/01/2014  . Hordeolum externum (stye) 04/23/2015    Right eye  . Sinusitis, acute 04/23/2015    Current Outpatient Prescriptions on File Prior to Visit  Medication Sig Dispense Refill  . albuterol (PROVENTIL HFA;VENTOLIN HFA) 108 (90 BASE) MCG/ACT inhaler Inhale 2 puffs into the lungs every 6 (six) hours as needed for wheezing or shortness of breath. 3 Inhaler 3  . amLODipine (NORVASC) 5 MG tablet Take 1 tablet (5 mg total) by mouth daily. 90 tablet 2  . aspirin-acetaminophen-caffeine (EXCEDRIN MIGRAINE) [id number] MG per tablet Take 1 tablet by mouth as needed.     . budesonide-formoterol (SYMBICORT) 160-4.5 MCG/ACT inhaler Inhale 2 puffs into the lungs 2 (two) times daily. 1 Inhaler 5  . calcium carbonate (TUMS EX) 750 MG chewable tablet Chew 1 tablet by mouth 2 (two) times daily.      . diclofenac sodium (VOLTAREN) 1 % GEL Apply 1 application topically 4 (four) times daily as needed. 3 Tube 0  . fluticasone (FLONASE) 50 MCG/ACT nasal spray USE 2 SPRAYS IN EACH NOSTRIL ONE TIME DAILY 48 g 2  . glucosamine-chondroitin 500-400 MG tablet Take 1 tablet by mouth 2 (two) times daily.      Marland Kitchen guaiFENesin  (MUCINEX) 600 MG 12 hr tablet Take 600 mg by mouth 2 (two) times daily. Using Extra at this time    . loratadine (CLARITIN) 10 MG tablet Take 10 mg by mouth daily.    Marland Kitchen losartan (COZAAR) 100 MG tablet Take 0.5 tablets (50 mg total) by mouth daily. 90 tablet 1  . meloxicam (MOBIC) 15 MG tablet TAKE 1 TABLET (15 MG TOTAL) BY MOUTH DAILY AS NEEDED FOR PAIN (WITH FOOD). 90 tablet 1  . Menthol (ROBITUSSIN COUGH DROPS MT) Use as directed in the mouth or throat as needed.    . montelukast (SINGULAIR) 10 MG tablet Take 1 tablet (10 mg total) by mouth daily. 90 tablet 3  . Multiple Vitamin (MULTIVITAMIN) tablet Take 1 tablet by mouth 3 (three) times a week.      . traMADol (ULTRAM) 50 MG tablet Take 1 tablet (50 mg total) by mouth every 8 (eight) hours as needed. 40 tablet 0   No current facility-administered medications on file prior to visit.    No Known Allergies  Family History  Problem Relation Age of Onset  . Prostate cancer Father     father died prostate ca  . Coronary artery disease      1st degree relative<60  . Stroke      1st degree relative<50  . Heart failure Mother     age 75  . Colon cancer Paternal Grandmother  Social History   Social History  . Marital Status: Married    Spouse Name: N/A  . Number of Children: N/A  . Years of Education: N/A   Occupational History  . retired Music therapist    Social History Main Topics  . Smoking status: Former Smoker -- 1.50 packs/day for 38 years    Types: Cigarettes    Quit date: 09/03/2000  . Smokeless tobacco: Never Used  . Alcohol Use: 1.2 - 1.8 oz/week    2-3 Cans of beer per week  . Drug Use: No  . Sexual Activity: No   Other Topics Concern  . None   Social History Narrative   Retired Music therapist   Patient states former smoker. 1 1/2 ppd x 38 yrs  Quit in Jan 2002   Married - 2 weeks (4th marriage)   divorced,  remarried 84-2004 (lost wife to lung ca),  remarried (divorced),    1 son  - 63  [name])   Alcohol use-yes (2-3 beers per week)           Review of Systems - See HPI.  All other ROS are negative.  BP 132/82 mmHg  Pulse 82  Temp(Src) 98.2 F (36.8 C) (Oral)  Ht 5\' 7"  (1.702 m)  Wt 150 lb (68.04 kg)  BMI 23.49 kg/m2  SpO2 97%  Physical Exam  Constitutional: He is oriented to person, place, and time and well-developed, well-nourished, and in no distress.  HENT:  Head: Normocephalic and atraumatic.  Right Ear: External ear normal.  Left Ear: External ear normal.  Nose: Nose normal.  Mouth/Throat: Oropharynx is clear and moist. No oropharyngeal exudate.  TM within normal limits bilaterally.  Eyes: Conjunctivae are normal. Pupils are equal, round, and reactive to light.  Neck: Neck supple.  Cardiovascular: Normal rate, regular rhythm, normal heart sounds and intact distal pulses.   Pulmonary/Chest: Effort normal. No respiratory distress. He has wheezes. He has no rales. He exhibits no tenderness.  Neurological: He is alert and oriented to person, place, and time.  Skin: Skin is warm and dry. No rash noted.  Psychiatric: Affect normal.  Vitals reviewed.    Assessment/Plan: 1. Acute bronchitis, unspecified organism With mild increased wheeze. ABX started.  Increase fluids.  Rest.  Saline nasal spray.  Probiotic.  Mucinex as directed.  Humidifier in bedroom. Will begin prednisone taper.  Call or return to clinic if symptoms are not improving. - predniSONE (DELTASONE) 10 MG tablet; Take 4 tablets daily for 3 days, then 3 tablets daily x 3 days, then 2 tablets daily x 3 days, then 1 tablet daily x 3 days.  Dispense: 30 tablet; Refill: 0 - azithromycin (ZITHROMAX) 250 MG tablet; Take 2 tablets on Day 1.  Then take 1 tablet daily.  Dispense: 6 tablet; Refill: 0

## 2015-12-18 NOTE — Progress Notes (Signed)
Pre visit review using our clinic review tool, if applicable. No additional management support is needed unless otherwise documented below in the visit note. 

## 2015-12-18 NOTE — Patient Instructions (Signed)
Take antibiotic (Azithromycin) as directed.  Increase fluids.  Get plenty of rest. Use Mucinex for congestion. Take the steroid taper as directed. Take a daily probiotic (I recommend Align or Culturelle, but even Activia Yogurt may be beneficial).  A humidifier placed in the bedroom may offer some relief for a dry, scratchy throat of nasal irritation.  Read information below on acute bronchitis. Please call or return to clinic if symptoms are not improving.  Acute Bronchitis Bronchitis is when the airways that extend from the windpipe into the lungs get red, puffy, and painful (inflamed). Bronchitis often causes thick spit (mucus) to develop. This leads to a cough. A cough is the most common symptom of bronchitis. In acute bronchitis, the condition usually begins suddenly and goes away over time (usually in 2 weeks). Smoking, allergies, and asthma can make bronchitis worse. Repeated episodes of bronchitis may cause more lung problems.  HOME CARE  Rest.  Drink enough fluids to keep your pee (urine) clear or pale yellow (unless you need to limit fluids as told by your doctor).  Only take over-the-counter or prescription medicines as told by your doctor.  Avoid smoking and secondhand smoke. These can make bronchitis worse. If you are a smoker, think about using nicotine gum or skin patches. Quitting smoking will help your lungs heal faster.  Reduce the chance of getting bronchitis again by:  Washing your hands often.  Avoiding people with cold symptoms.  Trying not to touch your hands to your mouth, nose, or eyes.  Follow up with your doctor as told.  GET HELP IF: Your symptoms do not improve after 1 week of treatment. Symptoms include:  Cough.  Fever.  Coughing up thick spit.  Body aches.  Chest congestion.  Chills.  Shortness of breath.  Sore throat.  GET HELP RIGHT AWAY IF:   You have an increased fever.  You have chills.  You have severe shortness of breath.  You  have bloody thick spit (sputum).  You throw up (vomit) often.  You lose too much body fluid (dehydration).  You have a severe headache.  You faint.  MAKE SURE YOU:   Understand these instructions.  Will watch your condition.  Will get help right away if you are not doing well or get worse. Document Released: 02/05/2008 Document Revised: 04/21/2013 Document Reviewed: 02/09/2013 [facility] Patient Information 2015 [place], [place]. This information is not intended to replace advice given to you by your health care provider. Make sure you discuss any questions you have with your health care provider.

## 2015-12-19 ENCOUNTER — Other Ambulatory Visit: Payer: Self-pay | Admitting: Family Medicine

## 2015-12-19 DIAGNOSIS — M1990 Unspecified osteoarthritis, unspecified site: Secondary | ICD-10-CM

## 2015-12-19 DIAGNOSIS — I10 Essential (primary) hypertension: Secondary | ICD-10-CM

## 2015-12-19 DIAGNOSIS — J209 Acute bronchitis, unspecified: Secondary | ICD-10-CM

## 2015-12-19 MED ORDER — MONTELUKAST SODIUM 10 MG PO TABS: 10.0000 mg | ORAL_TABLET | Freq: Every day | ORAL | Status: DC

## 2015-12-19 MED ORDER — BUDESONIDE-FORMOTEROL FUMARATE 160-4.5 MCG/ACT IN AERO: 2.0000 | INHALATION_SPRAY | Freq: Two times a day (BID) | RESPIRATORY_TRACT | Status: DC

## 2015-12-19 MED ORDER — MELOXICAM 15 MG PO TABS: ORAL_TABLET | ORAL | Status: DC

## 2015-12-19 MED ORDER — FLUTICASONE PROPIONATE 50 MCG/ACT NA SUSP: NASAL | Status: DC

## 2015-12-19 MED ORDER — AMLODIPINE BESYLATE 5 MG PO TABS: 5.0000 mg | ORAL_TABLET | Freq: Every day | ORAL | Status: DC

## 2015-12-19 MED ORDER — DICLOFENAC SODIUM 1 % TD GEL: 1.0000 "application " | Freq: Four times a day (QID) | TRANSDERMAL | Status: DC | PRN

## 2015-12-19 MED ORDER — LOSARTAN POTASSIUM 100 MG PO TABS: 50.0000 mg | ORAL_TABLET | Freq: Every day | ORAL | Status: DC

## 2016-01-09 NOTE — Progress Notes (Signed)
Patient ID: [name], male   DOB: 04-09-1946, 70 y.o.   MRN: [id number]     Cardiology Office Note   Date:  01/11/2016   ID:  [name], DOB 02/17/1946, MRN [id number]  PCP:  Penni Homans, MD  Cardiologist:   Jenkins Rouge, MD   Chief Complaint  Patient presents with  . pvc      History of Present Illness: [name] is a 70 y.o. male f/u of  atypical chest pain .initially in 2014   W seen by Dr Randel Pigg for URI, cough and sinusitis and in this setting had some dyspnea chest pain and palpitations History of partial RLL/RML resection 2009 from bronchiectasis   Echo 08/04/13 reviewed and normal Study Conclusions  - Left ventricle: The cavity size was normal. There was mild focal basal hypertrophy of the septum. Systolic function was normal. The estimated ejection fraction was in the range of 55% to 60%. Wall motion was normal; there were no regional wall motion abnormalities. - Pulmonary arteries: PA peak pressure: 66mm Hg (S).  48 hour holter December 2015  SR PVCs; no sustained arrhythmia  Very active fishing, working with horses and taking care of 2 acres No chest pain.    He declined ETT  F/U Echo: reviewed  01/03/15  Normal EF 55-60%   Recent bought of bronchitis requiring steroids and antibiotics.  No palpitations Will be seeing Dr Elsworth Soho now that Dr Joya Gaskins retired  Stopped his ARB on his own felt light headed and [place] ok   Past Medical History  Diagnosis Date  . Allergic rhinitis   . COPD (chronic obstructive pulmonary disease) (HCC)     FeV1 64%-2007  . GERD (gastroesophageal reflux disease)   . History of lung abscess     bronchiectasis with RMLandRLL ersection -1996- Dr Arlyce Dice  . Hypertension   . Osteoarthritis     hands and knees  . Emphysema   . Impaired vision     glasses  . Back pain 01/12/2013  . Atypical chest pain 12/01/2014  . Hordeolum externum (stye) 04/23/2015    Right eye  . Sinusitis, acute 04/23/2015    Past Surgical  History  Procedure Laterality Date  . Lung surgery      RML andRUL removed due to bleeding and bronchiectasis and lung abcess  . Neck surgery  05/2008  . Lower back surgery  09/2008    06/2009  . Hernia repair  10/2009  . Left knee    . Right foot surgery       Current Outpatient Prescriptions  Medication Sig Dispense Refill  . albuterol (PROVENTIL HFA;VENTOLIN HFA) 108 (90 BASE) MCG/ACT inhaler Inhale 2 puffs into the lungs every 6 (six) hours as needed for wheezing or shortness of breath. 3 Inhaler 3  . amLODipine (NORVASC) 5 MG tablet Take 1 tablet (5 mg total) by mouth daily. 90 tablet 2  . aspirin-acetaminophen-caffeine (EXCEDRIN MIGRAINE) [id number] MG per tablet Take 1 tablet by mouth as needed.     . budesonide-formoterol (SYMBICORT) 160-4.5 MCG/ACT inhaler Inhale 2 puffs into the lungs 2 (two) times daily. 3 Inhaler 1  . calcium carbonate (TUMS EX) 750 MG chewable tablet Chew 1 tablet by mouth 2 (two) times daily.      . diclofenac sodium (VOLTAREN) 1 % GEL Apply 1 application topically 4 (four) times daily as needed. 3 Tube 0  . fluticasone (FLONASE) 50 MCG/ACT nasal spray USE 2 SPRAYS IN EACH NOSTRIL ONE TIME DAILY 48 g  2  . glucosamine-chondroitin 500-400 MG tablet Take 1 tablet by mouth 2 (two) times daily.      Marland Kitchen guaiFENesin (MUCINEX) 600 MG 12 hr tablet Take 600 mg by mouth 2 (two) times daily. Using Extra at this time    . loratadine (CLARITIN) 10 MG tablet Take 10 mg by mouth daily.    Marland Kitchen losartan (COZAAR) 100 MG tablet Take 0.5 tablets (50 mg total) by mouth daily. 90 tablet 1  . meloxicam (MOBIC) 15 MG tablet TAKE 1 TABLET (15 MG TOTAL) BY MOUTH DAILY AS NEEDED FOR PAIN (WITH FOOD). 90 tablet 1  . Menthol (ROBITUSSIN COUGH DROPS MT) Use as directed in the mouth or throat as needed.    . montelukast (SINGULAIR) 10 MG tablet Take 1 tablet (10 mg total) by mouth daily. 90 tablet 3  . Multiple Vitamin (MULTIVITAMIN) tablet Take 1 tablet by mouth 3 (three) times a week.        No current facility-administered medications for this visit.    Allergies:   Review of patient's allergies indicates no known allergies.    Social History:  The patient  reports that he quit smoking about 15 years ago. His smoking use included Cigarettes. He has a 57 pack-year smoking history. He has never used smokeless tobacco. He reports that he drinks about 1.2 - 1.8 oz of alcohol per week. He reports that he does not use illicit drugs.   Family History:  The patient's family history includes Colon cancer in his paternal grandmother; Heart failure in his mother; Prostate cancer in his father.    ROS:  Please see the history of present illness.   Otherwise, review of systems are positive for none.   All other systems are reviewed and negative.    PHYSICAL EXAM: VS:  BP 128/78 mmHg  Pulse 81  Ht 5\' 7"  (1.702 m)  Wt 67.949 kg (149 lb 12.8 oz)  BMI 23.46 kg/m2 , BMI Body mass index is 23.46 kg/(m^2). Affect appropriate Healthy:  appears stated age 68: normal Neck supple with no adenopathy JVP normal no bruits no thyromegaly Lungs rhonchi and wheezing left and right  good diaphragmatic motion Previous right thoracotomy  Heart:  S1/S2 no murmur, no rub, gallop or click PMI normal Abdomen: benighn, BS positve, no tenderness, no AAA no bruit.  No HSM or HJR Distal pulses intact with no bruits No edema Neuro non-focal Skin warm and dry No muscular weakness    EKG:   11/21/14  SR 88 PAC PVC 01/11/16  SR rate 75 ICRBBB    Recent Labs: 04/10/2015: Hemoglobin 13.5; Platelets 283.0 07/26/2015: ALT 19; BUN 23; Creatinine, Ser 0.83; Potassium 4.3; Sodium 139    Lipid Panel    Component Value Date/Time   CHOL 159 07/26/2015 1021   TRIG 73.0 07/26/2015 1021   HDL 58.70 07/26/2015 1021   CHOLHDL 3 07/26/2015 1021   CHOLHDL 3.1 07/22/2014 1031   VLDL 14.6 07/26/2015 1021   LDLCALC 86 07/26/2015 1021   LDLDIRECT 85 07/22/2014 1031      Wt Readings from Last 3 Encounters:   01/11/16 67.949 kg (149 lb 12.8 oz)  12/18/15 68.04 kg (150 lb)  07/26/15 69.627 kg (153 lb 8 oz)      Other studies Reviewed: Additional studies/ records that were reviewed today include:  Epic notes Holter and old echo.    ASSESSMENT AND PLAN:  1.  PVC;s  Echo with normal EF .  He is very active and works out  daily and prefers to defer stress testing   Would be hesitant to use beta blocker with his active wheezing and lung disease 2. HTN:  Well controlled.  Off ARB follow clinically  3. Bronciectasis;  F/u pulmonary continue inhalers surprised he does not desat and use oxygen at home.  Suspect PVC;s related to lung disease No cor pulmonale on echo    Current medicines are reviewed at length with the patient today.  The patient does not have concerns regarding medicines.  The following changes have been made:  no change  Labs/ tests ordered today include:  Orders Placed This Encounter  Procedures  . EKG 12-Lead     Disposition:   FU with me in 6 months      Signed, Jenkins Rouge, MD  01/11/2016 9:11 AM    [facility] [address], [place], [place]  [address] Phone: [phone]; Fax: [phone]

## 2016-01-11 ENCOUNTER — Ambulatory Visit (INDEPENDENT_AMBULATORY_CARE_PROVIDER_SITE_OTHER): Payer: Commercial Managed Care - HMO | Admitting: Cardiovascular Disease

## 2016-01-11 ENCOUNTER — Encounter: Payer: Self-pay | Admitting: Cardiovascular Disease

## 2016-01-11 VITALS — BP 128/78 | HR 81 | Ht 67.0 in | Wt 149.8 lb

## 2016-01-11 DIAGNOSIS — I493 Ventricular premature depolarization: Secondary | ICD-10-CM

## 2016-01-11 NOTE — Patient Instructions (Signed)

## 2016-06-06 ENCOUNTER — Telehealth: Payer: Self-pay | Admitting: Family Medicine

## 2016-06-06 ENCOUNTER — Ambulatory Visit (INDEPENDENT_AMBULATORY_CARE_PROVIDER_SITE_OTHER): Payer: Commercial Managed Care - HMO | Admitting: Pulmonary Disease

## 2016-06-06 ENCOUNTER — Encounter: Payer: Self-pay | Admitting: Pulmonary Disease

## 2016-06-06 DIAGNOSIS — J441 Chronic obstructive pulmonary disease with (acute) exacerbation: Secondary | ICD-10-CM | POA: Diagnosis not present

## 2016-06-06 DIAGNOSIS — J449 Chronic obstructive pulmonary disease, unspecified: Secondary | ICD-10-CM

## 2016-06-06 DIAGNOSIS — I10 Essential (primary) hypertension: Secondary | ICD-10-CM

## 2016-06-06 MED ORDER — AMLODIPINE BESYLATE 5 MG PO TABS: 5.0000 mg | ORAL_TABLET | Freq: Every day | ORAL | 1 refills | Status: DC

## 2016-06-06 MED ORDER — FLUTICASONE PROPIONATE 50 MCG/ACT NA SUSP: NASAL | 1 refills | Status: DC

## 2016-06-06 MED ORDER — PREDNISONE 10 MG PO TABS: ORAL_TABLET | ORAL | 0 refills | Status: DC

## 2016-06-06 MED ORDER — TIOTROPIUM BROMIDE MONOHYDRATE 2.5 MCG/ACT IN AERS: 2.0000 | INHALATION_SPRAY | Freq: Every day | RESPIRATORY_TRACT | 0 refills | Status: DC

## 2016-06-06 MED ORDER — MELOXICAM 15 MG PO TABS: ORAL_TABLET | ORAL | 1 refills | Status: DC

## 2016-06-06 MED FILL — predniSONE 10 MG TABS: 10 | 10 days supply | Qty: 15 | Fill #0

## 2016-06-06 NOTE — Addendum Note (Signed)
Addended by: Mathis Dad on: 06/06/2016 03:59 PM   Modules accepted: Orders

## 2016-06-06 NOTE — Assessment & Plan Note (Signed)
We will check to see if he qualifies for lung cancer screening

## 2016-06-06 NOTE — Telephone Encounter (Signed)
Refills done and  Patient informed

## 2016-06-06 NOTE — Assessment & Plan Note (Signed)
Prednisone 10 mg tabs  Take 2 tabs daily with food x 5ds, then 1 tab daily with food x 5ds then STOP  Stay on Symbicort-2 puffs twice daily-rinse mouth after use  Trial of Spiriva sample-we discussed side effects-call me for prescription if this works  Call for antibiotic if yellow-green phlegm persists  Spirometry in future visits once exacerbation resolved

## 2016-06-06 NOTE — Telephone Encounter (Signed)
Caller name: Nas[place]  Relation to pt: self  Call back number: [phone] Pharmacy: [facility]  Reason for call: Pt came in office stating is needing some refills, medication are on the list that pt brought in printed out on a white sheet with [place]. Document was given to [place]. Please advise.

## 2016-06-06 NOTE — Addendum Note (Signed)
Addended by: Mathis Dad on: 06/06/2016 04:33 PM   Modules accepted: Orders

## 2016-06-06 NOTE — Progress Notes (Signed)
   Subjective:    Patient ID: [name], male    DOB: 16-Mar-1946, 70 y.o.   MRN: [id number]  HPI  70 yo male former smoker with COPD  (Spirometry FEV1 53%, ratio 54-2014) Lung abscess in 1996 s/p RML resection   06/06/2016  Chief Complaint  Patient presents with  . Follow-up    Congestion with weather chest, coughing up little bit of gray mucus.  CAT: 14   He quit smoking in 2002 He takes Mucinex all year round for about 15 years now  Remains on Symbicort Twice daily  .  Takes claritin and singulair .   He complains of increased congestion, hoarseness of voice and occasional wheezing -Generally prednisone works for this although it makes him mean  He is up-to-date with immunizations  Chest x-ray from 2016 reviewed shows a right-sided volume loss  Past Medical History:  Diagnosis Date  . Allergic rhinitis   . Atypical chest pain 12/01/2014  . Back pain 01/12/2013  . COPD (chronic obstructive pulmonary disease) (HCC)    FeV1 64%-2007  . Emphysema   . GERD (gastroesophageal reflux disease)   . History of lung abscess    bronchiectasis with RMLandRLL ersection -1996- Dr Arlyce Dice  . Hordeolum externum (stye) 04/23/2015   Right eye  . Hypertension   . Impaired vision    glasses  . Osteoarthritis    hands and knees  . Sinusitis, acute 04/23/2015      Review of Systems neg for any significant sore throat, dysphagia, itching, sneezing, nasal congestion or excess/ purulent secretions, fever, chills, sweats, unintended wt loss, pleuritic or exertional cp, hempoptysis, orthopnea pnd or change in chronic leg swelling.   Also denies presyncope, palpitations, heartburn, abdominal pain, nausea, vomiting, diarrhea or change in bowel or urinary habits, dysuria,hematuria, rash, arthralgias, visual complaints, headache, numbness weakness or ataxia.     Objective:   Physical Exam  Gen. Pleasant, well-nourished, in no distress ENT - no lesions, no post nasal drip Neck: No JVD,  no thyromegaly, no carotid bruits Lungs: no use of accessory muscles, no dullness to percussion, clear without rales , faint exp rhonchi  Cardiovascular: Rhythm regular, heart sounds  normal, no murmurs or gallops, no peripheral edema Musculoskeletal: No deformities, no cyanosis or clubbing        Assessment & Plan:

## 2016-06-06 NOTE — Patient Instructions (Signed)
Prednisone 10 mg tabs  Take 2 tabs daily with food x 5ds, then 1 tab daily with food x 5ds then STOP  Stay on Symbicort-2 puffs twice daily-rinse mouth after use  Trial of Spiriva sample-we discussed side effects-call me for prescription if this works  Call for antibiotic if yellow-green phlegm persists

## 2016-06-10 ENCOUNTER — Telehealth: Payer: Self-pay | Admitting: Pulmonary Disease

## 2016-06-10 DIAGNOSIS — Z87891 Personal history of nicotine dependence: Secondary | ICD-10-CM

## 2016-06-10 MED ORDER — TIOTROPIUM BROMIDE MONOHYDRATE 2.5 MCG/ACT IN AERS: 2.0000 | INHALATION_SPRAY | Freq: Every day | RESPIRATORY_TRACT | 11 refills | Status: DC

## 2016-06-10 MED ORDER — TIOTROPIUM BROMIDE MONOHYDRATE 2.5 MCG/ACT IN AERS: 2.0000 | INHALATION_SPRAY | Freq: Every day | RESPIRATORY_TRACT | 1 refills | Status: DC

## 2016-06-10 MED ORDER — BUDESONIDE-FORMOTEROL FUMARATE 160-4.5 MCG/ACT IN AERO: 2.0000 | INHALATION_SPRAY | Freq: Two times a day (BID) | RESPIRATORY_TRACT | 0 refills | Status: DC

## 2016-06-10 MED ORDER — AZITHROMYCIN 250 MG PO TABS: ORAL_TABLET | ORAL | 0 refills | Status: DC

## 2016-06-10 NOTE — Telephone Encounter (Signed)
Zpack

## 2016-06-10 NOTE — Addendum Note (Signed)
Addended by: Sharon Seller B on: 06/10/2016 10:39 AM   Modules accepted: Orders

## 2016-06-10 NOTE — Telephone Encounter (Signed)
[name], Will you please call this patient and confirm that he quit smoking > 15 years ago. I think he will not qualify based on the fact he quit in 2002, but Dr. Elsworth Soho wants to make sure. Thanks so much!!

## 2016-06-10 NOTE — Telephone Encounter (Signed)
OK to Rx Spiriva 2.5 2 puff daily

## 2016-06-10 NOTE — Addendum Note (Signed)
Addended by: Sharon Seller B on: 06/10/2016 10:38 AM   Modules accepted: Orders

## 2016-06-10 NOTE — Telephone Encounter (Signed)
-----   Message from Rigoberto Noel, MD sent at 06/06/2016  3:50 PM EDT ----- Please check to see if he qualifies for lung cancer screening -Quit smoking in 2002  RA

## 2016-06-10 NOTE — Telephone Encounter (Signed)
Spoke with pt and he is requesting rx for Spiriva as sample is working well. Rx sent to pharmacy. He would also like to have abx rx sent in as he has not improved much since OV with RA 06/06/16.   LOV 06/06/16 Instructions      Return in about 6 months (around 12/05/2016) for TP.  Prednisone 10 mg tabs  Take 2 tabs daily with food x 5ds, then 1 tab daily with food x 5ds then STOP  Stay on Symbicort-2 puffs twice daily-rinse mouth after use  Trial of Spiriva sample-we discussed side effects-call me for prescription if this works  Call for antibiotic if yellow-green phlegm persists   RA is not available, BQ please advise. Thanks!

## 2016-06-10 NOTE — Telephone Encounter (Signed)
Spiriva already sent. Pt requesting abx for continued symptoms, productive cough.  BQ - Please advise. Thanks!

## 2016-06-10 NOTE — Telephone Encounter (Signed)
Patient is requesting a refill of budesonide-formoterol (SYMBICORT) 160-4.5 MCG/ACT inhaler  He stated that he had 5 rxs called in to the pharmacy and all of them got filled besides this one. He is also wondering why he received a year's supply of medications but there are not enough refills to last the whole year. Please advise.    Patient phone: [phone] Pharmacy: [facility]

## 2016-06-10 NOTE — Telephone Encounter (Signed)
Called spoke with patient and advised of BQ's recommendations for zpak > pt would like this sent to [facility] in [place].  Pt also asked about his Spiriva Respimat > informed him this was refilled today.  Pt asked which pharmacy > [facility] in [place].  Pt stated this is incorrect and needs to go to [facility].  Advised pt will correct this.  Zpak sent to verified pharmacy Called [facility] in [place] and spoke with Breanna > Rx for Spiriva Resp has been cancelled Rx resent to [facility]  Nothing further needed; will sign off

## 2016-06-11 NOTE — Telephone Encounter (Signed)
Will route to lung cancer screening program

## 2016-06-12 NOTE — Addendum Note (Signed)
Addended by: Osa Craver on: 06/12/2016 02:52 PM   Modules accepted: Orders

## 2016-06-12 NOTE — Telephone Encounter (Signed)
Referral has been placed and pt will be contacted from work que. Nothing further needed.

## 2016-06-25 ENCOUNTER — Encounter: Payer: Self-pay | Admitting: Physician Assistant

## 2016-07-07 NOTE — Progress Notes (Signed)
Cardiology Office Note    Date:  07/09/2016   ID:  [name], DOB December 18, 1945, MRN [id number]  PCP:  Penni Homans, MD  Cardiologist:  Dr. Johnsie Cancel   CC: follow up  History of Present Illness:  [name] is a 70 y.o. male with a history of COPD, Lung abscess in 1996 s/p RML resection, HTN, and PVCs who presents to clinic for 6 month follow up.   He has a history of atypical chest pain in the setting of a URI dating back to 2014. 2D ECHO in 2014 and 2015 showed normal LV function and no valvular abnormalities. PVCs are felt to be related to lung disease. He was last seen by Dr. Johnsie Cancel in 01/2016 and doing well.   Today he presents to clinic for follow up. He has had a cough which is chronic. Occasionally, he has some chest pain with coughing. No exertional chest pain. He has chronic SOB that is not new or changed. He Is very active at his house and does a lot of yard work on his [place]. No LE edema, orthopnea on PND. He occasionally feels like he skips a beat. No other palpitations. No dizziness or syncope. No blood in his stool or urine.    Past Medical History:  Diagnosis Date  . Allergic rhinitis   . Atypical chest pain 12/01/2014  . Back pain 01/12/2013  . COPD (chronic obstructive pulmonary disease) (HCC)    FeV1 64%-2007  . Emphysema   . GERD (gastroesophageal reflux disease)   . History of lung abscess    bronchiectasis with RMLandRLL ersection -1996- Dr Arlyce Dice  . Hordeolum externum (stye) 04/23/2015   Right eye  . Hypertension   . Impaired vision    glasses  . Osteoarthritis    hands and knees  . Sinusitis, acute 04/23/2015    Past Surgical History:  Procedure Laterality Date  . HERNIA REPAIR  10/2009  . left knee    . lower back surgery  09/2008   06/2009  . LUNG SURGERY     RML andRUL removed due to bleeding and bronchiectasis and lung abcess  . NECK SURGERY  05/2008  . right foot surgery      Current Medications: Outpatient Medications Prior to  Visit  Medication Sig Dispense Refill  . albuterol (PROVENTIL HFA;VENTOLIN HFA) 108 (90 BASE) MCG/ACT inhaler Inhale 2 puffs into the lungs every 6 (six) hours as needed for wheezing or shortness of breath. 3 Inhaler 3  . amLODipine (NORVASC) 5 MG tablet Take 1 tablet (5 mg total) by mouth daily. 90 tablet 1  . aspirin-acetaminophen-caffeine (EXCEDRIN MIGRAINE) [id number] MG per tablet Take 1 tablet by mouth as needed.     . budesonide-formoterol (SYMBICORT) 160-4.5 MCG/ACT inhaler Inhale 2 puffs into the lungs 2 (two) times daily. 3 Inhaler 0  . calcium carbonate (TUMS EX) 750 MG chewable tablet Chew 1 tablet by mouth 2 (two) times daily.      . fluticasone (FLONASE) 50 MCG/ACT nasal spray USE 2 SPRAYS IN EACH NOSTRIL ONE TIME DAILY 48 g 1  . glucosamine-chondroitin 500-400 MG tablet Take 1 tablet by mouth 2 (two) times daily.      Marland Kitchen guaiFENesin (MUCINEX) 600 MG 12 hr tablet Take 600 mg by mouth 2 (two) times daily. Using Extra at this time    . loratadine (CLARITIN) 10 MG tablet Take 10 mg by mouth daily.    . meloxicam (MOBIC) 15 MG tablet TAKE 1 TABLET (15  MG TOTAL) BY MOUTH DAILY AS NEEDED FOR PAIN (WITH FOOD). 90 tablet 1  . Menthol (ROBITUSSIN COUGH DROPS MT) Use as directed in the mouth or throat as needed.    . montelukast (SINGULAIR) 10 MG tablet Take 1 tablet (10 mg total) by mouth daily. 90 tablet 3  . Multiple Vitamin (MULTIVITAMIN) tablet Take 1 tablet by mouth 3 (three) times a week.      . Tiotropium Bromide Monohydrate (SPIRIVA RESPIMAT) 2.5 MCG/ACT AERS Inhale 2 puffs into the lungs daily. 3 Inhaler 1  . azithromycin (ZITHROMAX) 250 MG tablet Take as directed 6 tablet 0  . diclofenac sodium (VOLTAREN) 1 % GEL Apply 1 application topically 4 (four) times daily as needed. 3 Tube 0  . losartan (COZAAR) 100 MG tablet Take 0.5 tablets (50 mg total) by mouth daily. 90 tablet 1  . predniSONE (DELTASONE) 10 MG tablet Take 2 tabs daily with food x 5ds, then 1 tab daily with food x 5ds  then STOP 15 tablet 0   No facility-administered medications prior to visit.      Allergies:   Patient has no known allergies.   Social History   Social History  . Marital status: Married    Spouse name: N/A  . Number of children: N/A  . Years of education: N/A   Occupational History  . retired Music therapist    Social History Main Topics  . Smoking status: Former Smoker    Packs/day: 1.50    Years: 38.00    Types: Cigarettes    Quit date: 09/03/2000  . Smokeless tobacco: Never Used  . Alcohol use 1.2 - 1.8 oz/week    2 - 3 Cans of beer per week  . Drug use: No  . Sexual activity: No   Other Topics Concern  . None   Social History Narrative   Retired Music therapist   Patient states former smoker. 1 1/2 ppd x 38 yrs  Quit in Jan 2002   Married - 2 weeks (4th marriage)   divorced,  remarried 84-2004 (lost wife to lung ca),  remarried (divorced),    1 son  - 59 [name])   Alcohol use-yes (2-3 beers per week)             Family History:  The patient's family history includes Colon cancer in his paternal grandmother; Heart failure in his mother; Prostate cancer in his father.     ROS:   Please see the history of present illness.    ROS All other systems reviewed and are negative.   PHYSICAL EXAM:   VS:  BP 130/70 (BP Location: Left Arm, Patient Position: Sitting, Cuff Size: Normal)   Pulse 73   Ht 5\' 8"  (1.727 m)   Wt 148 lb 8 oz (67.4 kg)   SpO2 96%   BMI 22.58 kg/m    GEN: Well nourished, well developed, in no acute distress  HEENT: normal  Neck: no JVD, carotid bruits, or masses Cardiac: RRR; no murmurs, rubs, or gallops,no edema  Respiratory:  clear to auscultation bilaterally, normal work of breathing GI: soft, nontender, nondistended, + BS MS: no deformity or atrophy  Skin: warm and dry, no rash Neuro:  Alert and Oriented x 3, Strength and sensation are intact Psych: euthymic mood, full affect  Wt Readings from Last 3 Encounters:    07/09/16 148 lb 8 oz (67.4 kg)  06/06/16 146 lb (66.2 kg)  01/11/16 149 lb 12.8 oz (67.9 kg)  Studies/Labs Reviewed:   EKG:  EKG is NOT ordered today.   Recent Labs: 07/26/2015: ALT 19; BUN 23; Creatinine, Ser 0.83; Potassium 4.3; Sodium 139   Lipid Panel    Component Value Date/Time   CHOL 159 07/26/2015 1021   TRIG 73.0 07/26/2015 1021   HDL 58.70 07/26/2015 1021   CHOLHDL 3 07/26/2015 1021   VLDL 14.6 07/26/2015 1021   LDLCALC 86 07/26/2015 1021   LDLDIRECT 85 07/22/2014 1031    Additional studies/ records that were reviewed today include:  2D ECHO: 01/03/2015 LV EF: 55% -   60% Study Conclusions - Left ventricle: The cavity size was normal. Wall thickness was   normal. Systolic function was normal. The estimated ejection   fraction was in the range of 55% to 60%. Wall motion was normal;   there were no regional wall motion abnormalities. Doppler   parameters are consistent with abnormal left ventricular   relaxation (grade 1 diastolic dysfunction). The E/e&' ratio is   between 8-15, suggseting indeterminate LV Filling pressure. - Left atrium: The atrium was normal in size. - Right atrium: The atrium was at the upper limits of normal in   size. - Inferior vena cava: The vessel was normal in size. The   respirophasic diameter changes were in the normal range (>= 50%),   consistent with normal central venous pressure. Impressions: - Compared to a prior echo in 2014, there do not appear to be any   significant changes.   ASSESSMENT & PLAN:   HTN: BP currently well controlled  PVCs: stable. Avoid BB 2/2 lung disease  COPD: followed closely by pulm   Medication Adjustments/Labs and Tests Ordered: Current medicines are reviewed at length with the patient today.  Concerns regarding medicines are outlined above.  Medication changes, Labs and Tests ordered today are listed in the Patient Instructions below. Patient Instructions  Medication Instructions:   Your physician recommends that you continue on your current medications as directed. Please refer to the Current Medication list given to you today.   Labwork: None ordered  Testing/Procedures: None ordered  Follow-Up: Your physician wants you to follow-up in: [place] Bl[place] Singer will receive a reminder letter in the mail two months in advance. If you don't receive a letter, please call our office to schedule the follow-up appointment.    Any Other Special Instructions Will Be Listed Below (If Applicable).     If you need a refill on your cardiac medications before your next appointment, please call your pharmacy.      Signed, Angelena Form, PA-C  07/09/2016 9:15 AM    [facility] [address], [place]  [address] Phone: [phone]; Fax: [phone]

## 2016-07-09 ENCOUNTER — Encounter: Payer: Self-pay | Admitting: Physician Assistant

## 2016-07-09 ENCOUNTER — Ambulatory Visit (INDEPENDENT_AMBULATORY_CARE_PROVIDER_SITE_OTHER): Payer: Commercial Managed Care - HMO | Admitting: Physician Assistant

## 2016-07-09 VITALS — BP 130/70 | HR 73 | Ht 68.0 in | Wt 148.5 lb

## 2016-07-09 DIAGNOSIS — J449 Chronic obstructive pulmonary disease, unspecified: Secondary | ICD-10-CM

## 2016-07-09 DIAGNOSIS — I10 Essential (primary) hypertension: Secondary | ICD-10-CM

## 2016-07-09 DIAGNOSIS — I493 Ventricular premature depolarization: Secondary | ICD-10-CM | POA: Diagnosis not present

## 2016-07-09 NOTE — Patient Instructions (Addendum)
Medication Instructions:  Your physician recommends that you continue on your current medications as directed. Please refer to the Current Medication list given to you today.   Labwork: None ordered  Testing/Procedures: None ordered  Follow-Up: Your physician wants you to follow-up in: 1 YEAR WITH DR. NISHAN   You will receive a reminder letter in the mail two months in advance. If you don't receive a letter, please call our office to schedule the follow-up appointment.   Any Other Special Instructions Will Be Listed Below (If Applicable).     If you need a refill on your cardiac medications before your next appointment, please call your pharmacy.   

## 2016-08-27 ENCOUNTER — Ambulatory Visit (HOSPITAL_BASED_OUTPATIENT_CLINIC_OR_DEPARTMENT_OTHER)
Admission: RE | Admit: 2016-08-27 | Discharge: 2016-08-27 | Disposition: A | Discharge status: Home / Self Care | Payer: Commercial Managed Care - HMO | Source: Ambulatory Visit | Attending: Medical | Admitting: Medical

## 2016-08-27 ENCOUNTER — Encounter: Payer: Self-pay | Admitting: Medical

## 2016-08-27 ENCOUNTER — Ambulatory Visit (INDEPENDENT_AMBULATORY_CARE_PROVIDER_SITE_OTHER): Payer: Commercial Managed Care - HMO | Admitting: Medical

## 2016-08-27 VITALS — BP 153/88 | HR 86 | Temp 97.6°F | Ht 67.0 in | Wt 150.4 lb

## 2016-08-27 DIAGNOSIS — J320 Chronic maxillary sinusitis: Secondary | ICD-10-CM

## 2016-08-27 DIAGNOSIS — R05 Cough: Secondary | ICD-10-CM | POA: Diagnosis not present

## 2016-08-27 DIAGNOSIS — J441 Chronic obstructive pulmonary disease with (acute) exacerbation: Secondary | ICD-10-CM

## 2016-08-27 DIAGNOSIS — R059 Cough, unspecified: Secondary | ICD-10-CM

## 2016-08-27 DIAGNOSIS — J209 Acute bronchitis, unspecified: Secondary | ICD-10-CM

## 2016-08-27 DIAGNOSIS — Z9889 Other specified postprocedural states: Secondary | ICD-10-CM | POA: Insufficient documentation

## 2016-08-27 LAB — CBC WITH DIFFERENTIAL/PLATELET
Basophils Absolute: 0 10*3/uL (ref 0.0–0.1)
Basophils Relative: 0.6 % (ref 0.0–3.0)
Eosinophils Absolute: 0.1 10*3/uL (ref 0.0–0.7)
Eosinophils Relative: 1.1 % (ref 0.0–5.0)
HCT: 43.7 % (ref 39.0–52.0)
Hemoglobin: 15.2 g/dL (ref 13.0–17.0)
Lymphocytes Relative: 25.8 % (ref 12.0–46.0)
Lymphs Abs: 2 10*3/uL (ref 0.7–4.0)
MCHC: 34.8 g/dL (ref 30.0–36.0)
MCV: 91.3 fl (ref 78.0–100.0)
Monocytes Absolute: 0.5 10*3/uL (ref 0.1–1.0)
Monocytes Relative: 6.6 % (ref 3.0–12.0)
Neutro Abs: 5.1 10*3/uL (ref 1.4–7.7)
Neutrophils Relative %: 65.9 % (ref 43.0–77.0)
Platelets: 262 10*3/uL (ref 150.0–400.0)
RBC: 4.79 Mil/uL (ref 4.22–5.81)
RDW: 13.8 % (ref 11.5–15.5)
WBC: 7.8 10*3/uL (ref 4.0–10.5)

## 2016-08-27 MED ORDER — DOXYCYCLINE HYCLATE 100 MG PO TABS: 100.0000 mg | ORAL_TABLET | Freq: Two times a day (BID) | ORAL | 0 refills | Status: DC

## 2016-08-27 MED ORDER — BENZONATATE 100 MG PO CAPS: 100.0000 mg | ORAL_CAPSULE | Freq: Three times a day (TID) | ORAL | 0 refills | Status: DC | PRN

## 2016-08-27 MED ORDER — PREDNISONE 10 MG PO TABS: ORAL_TABLET | ORAL | 0 refills | Status: DC

## 2016-08-27 MED FILL — BENZONATATE 100 MG CAPSULE: 100 | 7 days supply | Qty: 21 | Fill #0

## 2016-08-27 MED FILL — DOXYCYCLINE HYCLATE 100 MG: 100 | 10 days supply | Qty: 20 | Fill #0

## 2016-08-27 MED FILL — predniSONE 10 MG TABS: 10 | 6 days supply | Qty: 21 | Fill #0

## 2016-08-27 NOTE — Progress Notes (Signed)
Subjective:    Patient ID: [name], male    DOB: 04-07-1946, 70 y.o.   MRN: [id number]  HPI  Pt in states recent flare up of sinus pressure, nasal congestion and some chest congestion for 2 months on and off. Last 2 weeks states got worse. Sinus pressure, upper jaw pain and some productive cough.  Pt states history of chronic sinusitis and bronchitis.(get treatment couple of times a year)  Pt state one time in life pneumonia.  Pt has copd.  Pt had 2/3 of middle and upper lobe of rt lung removed 1996.  Pt has felt feverish and some chills last couple of weeks.  Pt has symbicort inhaler and spiriva.  Pt has albuterol available if needed.     Review of Systems  Constitutional: Positive for chills and fever. Negative for fatigue.  HENT: Positive for congestion, sinus pain and sinus pressure. Negative for postnasal drip, sneezing and trouble swallowing.   Respiratory: Positive for cough. Negative for chest tightness, shortness of breath and wheezing.   Cardiovascular: Negative for chest pain and palpitations.  Gastrointestinal: Negative for abdominal distention, abdominal pain, blood in stool, constipation, nausea and vomiting.  Musculoskeletal: Negative for back pain.  Skin: Negative for rash.  Neurological: Negative for dizziness and headaches.  Hematological: Negative for adenopathy. Does not bruise/bleed easily.  Psychiatric/Behavioral: Negative for behavioral problems and confusion.    Past Medical History:  Diagnosis Date  . Allergic rhinitis   . Atypical chest pain 12/01/2014  . Back pain 01/12/2013  . COPD (chronic obstructive pulmonary disease) (HCC)    FeV1 64%-2007  . Emphysema   . GERD (gastroesophageal reflux disease)   . History of lung abscess    bronchiectasis with RMLandRLL ersection -1996- Dr Arlyce Dice  . Hordeolum externum (stye) 04/23/2015   Right eye  . Hypertension   . Impaired vision    glasses  . Osteoarthritis    hands and knees  .  Sinusitis, acute 04/23/2015     Social History   Social History  . Marital status: Married    Spouse name: N/A  . Number of children: N/A  . Years of education: N/A   Occupational History  . retired Music therapist    Social History Main Topics  . Smoking status: Former Smoker    Packs/day: 1.50    Years: 38.00    Types: Cigarettes    Quit date: 09/03/2000  . Smokeless tobacco: Never Used  . Alcohol use 1.2 - 1.8 oz/week    2 - 3 Cans of beer per week  . Drug use: No  . Sexual activity: No   Other Topics Concern  . Not on file   Social History Narrative   Retired Music therapist   Patient states former smoker. 1 1/2 ppd x 38 yrs  Quit in Jan 2002   Married - 2 weeks (4th marriage)   divorced,  remarried 84-2004 (lost wife to lung ca),  remarried (divorced),    1 son  - 32 [name])   Alcohol use-yes (2-3 beers per week)            Past Surgical History:  Procedure Laterality Date  . HERNIA REPAIR  10/2009  . left knee    . lower back surgery  09/2008   06/2009  . LUNG SURGERY     RML andRUL removed due to bleeding and bronchiectasis and lung abcess  . NECK SURGERY  05/2008  . right foot surgery  Family History  Problem Relation Age of Onset  . Prostate cancer Father     father died prostate ca  . Coronary artery disease      1st degree relative<60  . Stroke      1st degree relative<50  . Heart failure Mother     age 72  . Colon cancer Paternal Grandmother     No Known Allergies  Current Outpatient Prescriptions on File Prior to Visit  Medication Sig Dispense Refill  . albuterol (PROVENTIL HFA;VENTOLIN HFA) 108 (90 BASE) MCG/ACT inhaler Inhale 2 puffs into the lungs every 6 (six) hours as needed for wheezing or shortness of breath. 3 Inhaler 3  . amLODipine (NORVASC) 5 MG tablet Take 1 tablet (5 mg total) by mouth daily. 90 tablet 1  . aspirin-acetaminophen-caffeine (EXCEDRIN MIGRAINE) [id number] MG per tablet Take 1 tablet by mouth as  needed.     . budesonide-formoterol (SYMBICORT) 160-4.5 MCG/ACT inhaler Inhale 2 puffs into the lungs 2 (two) times daily. 3 Inhaler 0  . calcium carbonate (TUMS EX) 750 MG chewable tablet Chew 1 tablet by mouth 2 (two) times daily.      . fluticasone (FLONASE) 50 MCG/ACT nasal spray USE 2 SPRAYS IN EACH NOSTRIL ONE TIME DAILY 48 g 1  . glucosamine-chondroitin 500-400 MG tablet Take 1 tablet by mouth 2 (two) times daily.      Marland Kitchen guaiFENesin (MUCINEX) 600 MG 12 hr tablet Take 600 mg by mouth 2 (two) times daily. Using Extra at this time    . loratadine (CLARITIN) 10 MG tablet Take 10 mg by mouth daily.    . meloxicam (MOBIC) 15 MG tablet TAKE 1 TABLET (15 MG TOTAL) BY MOUTH DAILY AS NEEDED FOR PAIN (WITH FOOD). 90 tablet 1  . Menthol (ROBITUSSIN COUGH DROPS MT) Use as directed in the mouth or throat as needed.    . montelukast (SINGULAIR) 10 MG tablet Take 1 tablet (10 mg total) by mouth daily. 90 tablet 3  . Multiple Vitamin (MULTIVITAMIN) tablet Take 1 tablet by mouth 3 (three) times a week.      . Tiotropium Bromide Monohydrate (SPIRIVA RESPIMAT) 2.5 MCG/ACT AERS Inhale 2 puffs into the lungs daily. 3 Inhaler 1   No current facility-administered medications on file prior to visit.     BP (!) 153/88 (BP Location: Left Arm, Patient Position: Sitting, Cuff Size: Small)   Pulse 86   Temp 97.6 F (36.4 C) (Oral)   Ht 5\' 7"  (1.702 m)   Wt 150 lb 6.4 oz (68.2 kg)   SpO2 96%   BMI 23.56 kg/m       Objective:   Physical Exam  General  Mental Status - Alert. General Appearance - Well groomed. Not in acute distress.  Skin Rashes- No Rashes.  HEENT Head- Normal. Ear Auditory Canal - Left- Normal. Right - Normal.Tympanic Membrane- Left- Normal. Right- Normal. Eye Sclera/Conjunctiva- Left- Normal. Right- Normal. Nose & Sinuses Nasal Mucosa- Left-  Boggy and Congested. Right-  Boggy and  Congested.Bilateral   maxillary and   frontal sinus pressure. Mouth & Throat Lips: Upper Lip-  Normal: no dryness, cracking, pallor, cyanosis, or vesicular eruption. Lower Lip-Normal: no dryness, cracking, pallor, cyanosis or vesicular eruption. Buccal Mucosa- Bilateral- No Aphthous ulcers. Oropharynx- No Discharge or Erythema. Tonsils: Characteristics- Bilateral- No Erythema or Congestion. Size/Enlargement- Bilateral- No enlargement. Discharge- bilateral-None.  Neck Neck- Supple. No Masses.   Chest and Lung Exam Auscultation: Breath Sounds:-even and unlabored but shallow  Cardiovascular Auscultation:Rythm- Regular, rate and  rhythm. Murmurs & Other Heart Sounds:Ausculatation of the heart reveal- No Murmurs.  Lymphatic Head & Neck General Head & Neck Lymphatics: Bilateral: Description- No Localized lymphadenopathy.       Assessment & Plan:   You appear to have bronchitis and sinusitis. Rest hydrate and tylenol for fever. I am prescribing cough medicine benzonatate , and doxycline  antibiotic.   Will get cxr today to assess if you have pneumonia. Also check infection fighting cells with lab work.  Use your inhalers. Will got ahead and give you tapered prednisone tablets.    Follow up in 7-10 days or as needed  Lurlene Ronda, Percell Miller, [employer]

## 2016-08-27 NOTE — Progress Notes (Signed)
Pre visit review using our clinic review tool, if applicable. No additional management support is needed unless otherwise documented below in the visit note. 

## 2016-08-27 NOTE — Patient Instructions (Addendum)
You appear to have bronchitis and sinusitis. Rest hydrate and tylenol for fever. I am prescribing cough medicine benzonatate , and doxycycline antibiotic.   Will get cxr today to assess if you have pneumonia. Also check infection fighting cells with lab work.  Use your inhalers. Will got ahead and give you tapered prednisone tablets.    Follow up in 7-10 days or as needed

## 2016-09-03 ENCOUNTER — Telehealth: Payer: Self-pay | Admitting: Pulmonary Disease

## 2016-09-03 DIAGNOSIS — Z87891 Personal history of nicotine dependence: Secondary | ICD-10-CM

## 2016-09-06 ENCOUNTER — Encounter: Payer: Self-pay | Admitting: *Deleted

## 2016-09-06 ENCOUNTER — Encounter: Payer: Self-pay | Admitting: Family Medicine

## 2016-09-06 ENCOUNTER — Encounter: Payer: Self-pay | Admitting: Pulmonary Disease

## 2016-09-06 NOTE — Telephone Encounter (Signed)
See referral notes.  Pt scheduled for [facility] and CT ordered.  Nothing further needed.

## 2016-09-06 NOTE — Telephone Encounter (Signed)
CORRECTION Pt does not qualify for the [employer] Program due to smoking history being more than fifteen years Order cancelled Patient aware

## 2016-09-09 ENCOUNTER — Telehealth: Payer: Self-pay | Admitting: Pulmonary Disease

## 2016-09-09 ENCOUNTER — Telehealth: Payer: Self-pay | Admitting: *Deleted

## 2016-09-09 ENCOUNTER — Telehealth: Payer: Self-pay | Admitting: Family Medicine

## 2016-09-09 NOTE — Telephone Encounter (Signed)
Spoke with pt. He was calling back about the MyChart message he sent [place]. I apologized for the mess up with his appointments. He states that he called his PCP this morning at 7am. He also asked that the employee who canceled his physical to stay clear of his medical records. Nothing further was needed.

## 2016-09-09 NOTE — Telephone Encounter (Signed)
error 

## 2016-09-09 NOTE — Progress Notes (Signed)
Subjective:   [name] is a 71 y.o. male who presents for Medicare Annual/Subsequent preventive examination.  Review of Systems:  No ROS.  Medicare Wellness Visit. Cardiac Risk Factors include: advanced age (>81men, >74 women);hypertension;male gender Sleep patterns: Sleeps about 6-7 hrs per night. Occasionally has to wake up to urinate during the night. Feels rested. Home Safety/Smoke Alarms:  Smoke detectors in place. Living environment; residence and Firearm Safety: Lives with wife and several pets. Guns are properly stored. Feels safe.  Seat Belt Safety/Bike Helmet: Wears seat belt.   Counseling:   Eye Exam- Wears glasses. Goes to eye doctor as needed.  Dental- Upper and lower dentures. Gum care discussed.   Male:   CCS- Last 11/20/10: 5 mm sessile polyp in the ascending colon, Mild diverticulosis in the sigmoid colon    PSA-  Lab Results  Component Value Date   PSA 2.06 07/26/2015   PSA 2.21 07/22/2014   PSA 1.48 07/15/2013        Objective:    Vitals: BP 122/78 (BP Location: Left Arm, Patient Position: Sitting, Cuff Size: Normal)   Pulse 90   Ht 5\' 7"  (1.702 m)   Wt 148 lb 6.4 oz (67.3 kg)   SpO2 90%   BMI 23.24 kg/m   Body mass index is 23.24 kg/m.  Tobacco History  Smoking Status  . Former Smoker  . Packs/day: 1.50  . Years: 38.00  . Types: Cigarettes  . Quit date: 09/03/2000  Smokeless Tobacco  . Never Used     Counseling given: No   Past Medical History:  Diagnosis Date  . Allergic rhinitis   . Atypical chest pain 12/01/2014  . Back pain 01/12/2013  . COPD (chronic obstructive pulmonary disease) (HCC)    FeV1 64%-2007  . Emphysema   . GERD (gastroesophageal reflux disease)   . History of lung abscess    bronchiectasis with RMLandRLL ersection -1996- Dr Arlyce Dice  . Hordeolum externum (stye) 04/23/2015   Right eye  . Hypertension   . Impaired vision    glasses  . Osteoarthritis    hands and knees  . Sinusitis, acute 04/23/2015   Past  Surgical History:  Procedure Laterality Date  . HERNIA REPAIR  10/2009  . left knee    . lower back surgery  09/2008   06/2009  . LUNG SURGERY     RML andRUL removed due to bleeding and bronchiectasis and lung abcess  . NECK SURGERY  05/2008  . right foot surgery     Family History  Problem Relation Age of Onset  . Prostate cancer Father     father died prostate ca  . Coronary artery disease      1st degree relative<60  . Stroke      1st degree relative<50  . Heart failure Mother     age 57  . Colon cancer Paternal Grandmother    History  Sexual Activity  . Sexual activity: No    Outpatient Encounter Prescriptions as of 09/10/2016  Medication Sig  . albuterol (PROVENTIL HFA;VENTOLIN HFA) 108 (90 BASE) MCG/ACT inhaler Inhale 2 puffs into the lungs every 6 (six) hours as needed for wheezing or shortness of breath.  Marland Kitchen amLODipine (NORVASC) 5 MG tablet Take 1 tablet (5 mg total) by mouth daily.  Marland Kitchen aspirin-acetaminophen-caffeine (EXCEDRIN MIGRAINE) 250-250-65 MG per tablet Take 1 tablet by mouth as needed.   . budesonide-formoterol (SYMBICORT) 160-4.5 MCG/ACT inhaler Inhale 2 puffs into the lungs 2 (two) times daily.  Marland Kitchen  calcium carbonate (TUMS EX) 750 MG chewable tablet Chew 1 tablet by mouth 2 (two) times daily.    . fluticasone (FLONASE) 50 MCG/ACT nasal spray USE 2 SPRAYS IN EACH NOSTRIL ONE TIME DAILY  . glucosamine-chondroitin 500-400 MG tablet Take 1 tablet by mouth 2 (two) times daily.    Marland Kitchen guaiFENesin (MUCINEX) 600 MG 12 hr tablet Take 600 mg by mouth 2 (two) times daily. Using Extra at this time  . loratadine (CLARITIN) 10 MG tablet Take 10 mg by mouth daily.  . meloxicam (MOBIC) 15 MG tablet TAKE 1 TABLET (15 MG TOTAL) BY MOUTH DAILY AS NEEDED FOR PAIN (WITH FOOD).  Marland Kitchen montelukast (SINGULAIR) 10 MG tablet Take 1 tablet (10 mg total) by mouth daily.  . Multiple Vitamin (MULTIVITAMIN) tablet Take 1 tablet by mouth 3 (three) times a week.    . Tiotropium Bromide Monohydrate  (SPIRIVA RESPIMAT) 2.5 MCG/ACT AERS Inhale 2 puffs into the lungs daily.  . Menthol (ROBITUSSIN COUGH DROPS MT) Use as directed in the mouth or throat as needed.  . [DISCONTINUED] benzonatate (TESSALON) 100 MG capsule Take 1 capsule (100 mg total) by mouth 3 (three) times daily as needed for cough.  . [DISCONTINUED] doxycycline (VIBRA-TABS) 100 MG tablet Take 1 tablet (100 mg total) by mouth 2 (two) times daily.  . [DISCONTINUED] predniSONE (DELTASONE) 10 MG tablet 6 TAB PO DAY 1 5 TAB PO DAY 2 4 TAB PO DAY 3 3 TAB PO DAY 4 2 TAB PO DAY 5 1 TAB PO DAY 6   No facility-administered encounter medications on file as of 09/10/2016.     Activities of Daily Living In your present state of health, do you have any difficulty performing the following activities: 09/10/2016  Hearing? N  Vision? N  Difficulty concentrating or making decisions? N  Walking or climbing stairs? N  Dressing or bathing? N  Doing errands, shopping? N  Preparing Food and eating ? N  Using the Toilet? N  In the past six months, have you accidently leaked urine? N  Do you have problems with loss of bowel control? N  Managing your Medications? N  Managing your Finances? N  Housekeeping or managing your Housekeeping? N  Some recent data might be hidden    Patient Care Team: Mosie Lukes, MD as PCP - General (Family Medicine) Josue Hector, MD as PCP - Cardiology (Cardiology) Elsie Stain, MD as Attending Physician (Pulmonary Disease)   Assessment:    Physical assessment deferred to PCP.  Exercise Activities and Dietary recommendations Current Exercise Habits: Home exercise routine, Type of exercise: stretching, Time (Minutes): 30, Frequency (Times/Week): 7, Weekly Exercise (Minutes/Week): 210, Intensity: Mild   Diet (meal preparation, eat out, water intake, caffeinated beverages, dairy products, fruits and vegetables): in general, a "healthy" diet  , well balanced, on average, 3 meals per day Encouraged to  drink water.   Goals    . maintain healthy lifestyle.      Fall Risk Fall Risk  09/10/2016 11/07/2014 07/22/2014  Falls in the past year? Yes Yes No  Number falls in past yr: 1 1 -  Injury with Fall? No No -  Follow up Falls prevention discussed;Education provided - -   Depression Screen PHQ 2/9 Scores 09/10/2016 11/07/2014 07/22/2014  PHQ - 2 Score 0 0 0    Cognitive Function MMSE - Mini Mental State Exam 09/10/2016  Orientation to time 5  Orientation to Place 5  Registration 3  Attention/ Calculation 5  Recall  3  Language- name 2 objects 2  Language- repeat 1  Language- follow 3 step command 3  Language- read & follow direction 1  Write a sentence 1  Copy design 1  Total score 30        Immunization History  Administered Date(s) Administered  . Influenza Split 06/02/2012  . Influenza Whole 05/15/2010, 05/02/2011, 05/18/2013  . Influenza,inj,Quad PF,36+ Mos 05/14/2014, 05/25/2015  . Influenza-Unspecified 05/15/2016  . Pneumococcal Conjugate-13 05/20/2013, 05/15/2016  . Pneumococcal Polysaccharide-23 07/03/2001, 05/02/2011  . Td 05/15/2004  . Tdap 02/17/2014  . Zoster 07/16/2011   Screening Tests Health Maintenance  Topic Date Due  . PNA vac Low Risk Adult (2 of 2 - PPSV23) 05/15/2017  . COLONOSCOPY  11/19/2020  . TETANUS/TDAP  02/18/2024  . INFLUENZA VACCINE  Completed  . ZOSTAVAX  Completed  . Hepatitis C Screening  Completed      Plan:     Eat a  heart healthy diet (full of fruits, vegetables, whole grains, lean protein, water--limit salt, fat, and sugar intake).   Continue your physical activities.  Continue doing brain stimulating activities (puzzles, reading, adult coloring books, staying active) to keep memory sharp.    During the course of the visit the patient was educated and counseled about the following appropriate screening and preventive services:   Vaccines to include Pneumoccal, Influenza, Hepatitis B, Td, Zostavax, HCV  Cardiovascular  Disease  Colorectal cancer screening  Diabetes screening  Prostate Cancer Screening  Glaucoma screening  Nutrition counseling   Patient Instructions (the written plan) was given to the patient.    Shela Nevin, [place]  09/10/2016  RN AWV note reviewed. Agree with documention and plan.  Penni Homans, MD

## 2016-09-09 NOTE — Telephone Encounter (Signed)
Scheduled for 09/10/16 @1pm 

## 2016-09-09 NOTE — Telephone Encounter (Signed)
lmtcb x1 for pt.  I called Dr. Frederik Pear office. Pt has been added back to her schedule for 09/10/16 at 1:30pm. His appointments for [facility] and CT were canceled due to him not being eligible for the Lung Cancer Screening Program.

## 2016-09-10 ENCOUNTER — Encounter: Payer: Self-pay | Admitting: Family Medicine

## 2016-09-10 ENCOUNTER — Ambulatory Visit (INDEPENDENT_AMBULATORY_CARE_PROVIDER_SITE_OTHER): Payer: Commercial Managed Care - HMO | Admitting: Family Medicine

## 2016-09-10 ENCOUNTER — Encounter: Payer: Commercial Managed Care - HMO | Admitting: Family Medicine

## 2016-09-10 VITALS — BP 122/78 | HR 90 | Ht 67.0 in | Wt 148.4 lb

## 2016-09-10 DIAGNOSIS — J449 Chronic obstructive pulmonary disease, unspecified: Secondary | ICD-10-CM | POA: Diagnosis not present

## 2016-09-10 DIAGNOSIS — Z0000 Encounter for general adult medical examination without abnormal findings: Secondary | ICD-10-CM | POA: Diagnosis not present

## 2016-09-10 DIAGNOSIS — Z125 Encounter for screening for malignant neoplasm of prostate: Secondary | ICD-10-CM | POA: Diagnosis not present

## 2016-09-10 DIAGNOSIS — L570 Actinic keratosis: Secondary | ICD-10-CM

## 2016-09-10 DIAGNOSIS — K219 Gastro-esophageal reflux disease without esophagitis: Secondary | ICD-10-CM

## 2016-09-10 DIAGNOSIS — I10 Essential (primary) hypertension: Secondary | ICD-10-CM

## 2016-09-10 DIAGNOSIS — R350 Frequency of micturition: Secondary | ICD-10-CM | POA: Diagnosis not present

## 2016-09-10 NOTE — Patient Instructions (Addendum)
Eat a  heart healthy diet (full of fruits, vegetables, whole grains, lean protein, water--limit salt, fat, and sugar intake).  Continue your physical activities.  Continue doing brain stimulating activities (puzzles, reading, adult coloring books, staying active) to keep memory sharp. Preventive Care 29 Years and Older, Male Preventive care refers to lifestyle choices and visits with your health care provider that can promote health and wellness. What does preventive care include?  A yearly physical exam. This is also called an annual well check.  Dental exams once or twice a year.  Routine eye exams. Ask your health care provider how often you should have your eyes checked.  Personal lifestyle choices, including:  Daily care of your teeth and gums.  Regular physical activity.  Eating a healthy diet.  Avoiding tobacco and drug use.  Limiting alcohol use.  Practicing safe sex.  Taking low doses of aspirin every day.  Taking vitamin and mineral supplements as recommended by your health care provider. What happens during an annual well check? The services and screenings done by your health care provider during your annual well check will depend on your age, overall health, lifestyle risk factors, and family history of disease. Counseling  Your health care provider may ask you questions about your:  Alcohol use.  Tobacco use.  Drug use.  Emotional well-being.  Home and relationship well-being.  Sexual activity.  Eating habits.  History of falls.  Memory and ability to understand (cognition).  Work and work Statistician. Screening  You may have the following tests or measurements:  Height, weight, and BMI.  Blood pressure.  Lipid and cholesterol levels. These may be checked every 5 years, or more frequently if you are over 70 years old.  Skin check.  Lung cancer screening. You may have this screening every year starting at age 70 if you have a 30-pack-year  history of smoking and currently smoke or have quit within the past 15 years.  Fecal occult blood test (FOBT) of the stool. You may have this test every year starting at age 70.  Flexible sigmoidoscopy or colonoscopy. You may have a sigmoidoscopy every 5 years or a colonoscopy every 10 years starting at age 70.  Prostate cancer screening. Recommendations will vary depending on your family history and other risks.  Hepatitis C blood test.  Hepatitis B blood test.  Sexually transmitted disease (STD) testing.  Diabetes screening. This is done by checking your blood sugar (glucose) after you have not eaten for a while (fasting). You may have this done every 1-3 years.  Abdominal aortic aneurysm (AAA) screening. You may need this if you are a current or former smoker.  Osteoporosis. You may be screened starting at age 70 if you are at high risk. Talk with your health care provider about your test results, treatment options, and if necessary, the need for more tests. Vaccines  Your health care provider may recommend certain vaccines, such as:  Influenza vaccine. This is recommended every year.  Tetanus, diphtheria, and acellular pertussis (Tdap, Td) vaccine. You may need a Td booster every 10 years.  Varicella vaccine. You may need this if you have not been vaccinated.  Zoster vaccine. You may need this after age 70.  Measles, mumps, and rubella (MMR) vaccine. You may need at least one dose of MMR if you were born in 1957 or later. You may also need a second dose.  Pneumococcal 13-valent conjugate (PCV13) vaccine. One dose is recommended after age 70.  Pneumococcal polysaccharide (PPSV23)  vaccine. One dose is recommended after age 70.  Meningococcal vaccine. You may need this if you have certain conditions.  Hepatitis A vaccine. You may need this if you have certain conditions or if you travel or work in places where you may be exposed to hepatitis A.  Hepatitis B vaccine. You may  need this if you have certain conditions or if you travel or work in places where you may be exposed to hepatitis B.  Haemophilus influenzae type b (Hib) vaccine. You may need this if you have certain risk factors. Talk to your health care provider about which screenings and vaccines you need and how often you need them. This information is not intended to replace advice given to you by your health care provider. Make sure you discuss any questions you have with your health care provider. Document Released: 09/15/2015 Document Revised: 05/08/2016 Document Reviewed: 06/20/2015 Elsevier Interactive Patient Education  2017 [employer].

## 2016-09-10 NOTE — Assessment & Plan Note (Signed)
Has worst trouble at 5000 feet.

## 2016-09-10 NOTE — Assessment & Plan Note (Addendum)
Avoiding offending foods, take probiotics. Do not eat large meals in late evening and consider raising head of bed. Report any worsening symptoms, is doing well at this time.

## 2016-09-10 NOTE — Assessment & Plan Note (Signed)
Well controlled, no changes to meds. Encouraged heart healthy diet such as the DASH diet and exercise as tolerated.  °

## 2016-09-11 LAB — COMPREHENSIVE METABOLIC PANEL
ALT: 24 U/L (ref 0–53)
AST: 27 U/L (ref 0–37)
Albumin: 3.8 g/dL (ref 3.5–5.2)
Alkaline Phosphatase: 74 U/L (ref 39–117)
BUN: 28 mg/dL — ABNORMAL HIGH (ref 6–23)
CO2: 25 mEq/L (ref 19–32)
Calcium: 9.2 mg/dL (ref 8.4–10.5)
Chloride: 106 mEq/L (ref 96–112)
Creatinine, Ser: 0.86 mg/dL (ref 0.40–1.50)
GFR: 93.48 mL/min (ref >60.00)
Glucose, Bld: 89 mg/dL (ref 70–99)
Potassium: 4.1 mEq/L (ref 3.5–5.1)
Sodium: 142 mEq/L (ref 135–145)
Total Bilirubin: 0.6 mg/dL (ref 0.2–1.2)
Total Protein: 6.4 g/dL (ref 6.0–8.3)

## 2016-09-11 LAB — LIPID PANEL
Cholesterol: 181 mg/dL (ref 0–200)
HDL: 61.3 mg/dL (ref >39.00)
LDL Cholesterol: 91 mg/dL (ref 0–99)
NonHDL: 119.66
Total CHOL/HDL Ratio: 3
Triglycerides: 144 mg/dL (ref 0.0–149.0)
VLDL: 28.8 mg/dL (ref 0.0–40.0)

## 2016-09-11 LAB — TSH: TSH: 0.42 u[IU]/mL (ref 0.35–4.50)

## 2016-09-11 LAB — PSA: PSA: 1.91 ng/mL (ref 0.10–4.00)

## 2016-09-11 NOTE — Progress Notes (Signed)
Patient ID: [name], male   DOB: 1945-11-01, 71 y.o.   MRN: [id number]   Subjective:    Patient ID: [name], male    DOB: [birth year], 71 y.o.   MRN: [id number]  Chief Complaint  Patient presents with  . Medicare Wellness    HPI Patient is in today for follow up. He is feeling well. He denies any recent acute febrile illness or hospitalization. He is maintaining a heart healthy diet and he stays very active. Does a great deal of kayaking and fishing. His only noted complaint is urinary frequency and urgency. He gets up roughly twice nightly and he has issues with incontinence from time to time. He denies any dysuria or hematuria. No abdominal or back pain. Reflux is doing well as long as he watches what he eats. Denies CP/palp/SOB/HA/congestion/fevers/GI c/o. Taking meds as prescribed  Past Medical History:  Diagnosis Date  . Allergic rhinitis   . Atypical chest pain 12/01/2014  . Back pain 01/12/2013  . COPD (chronic obstructive pulmonary disease) (HCC)    FeV1 64%-2007  . Emphysema   . GERD (gastroesophageal reflux disease)   . History of lung abscess    bronchiectasis with RMLandRLL ersection -1996- Dr Arlyce Dice  . Hordeolum externum (stye) 04/23/2015   Right eye  . Hypertension   . Impaired vision    glasses  . Osteoarthritis    hands and knees  . Sinusitis, acute 04/23/2015    Past Surgical History:  Procedure Laterality Date  . HERNIA REPAIR  10/2009  . left knee    . lower back surgery  09/2008   06/2009  . LUNG SURGERY     RML andRUL removed due to bleeding and bronchiectasis and lung abcess  . NECK SURGERY  05/2008  . right foot surgery      Family History  Problem Relation Age of Onset  . Prostate cancer Father     father died prostate ca  . Coronary artery disease      1st degree relative<60  . Stroke      1st degree relative<50  . Heart failure Mother     age 72  . Colon cancer Paternal Grandmother   . Obstructive Sleep Apnea Brother   . Obesity  Brother     Social History   Social History  . Marital status: Married    Spouse name: N/A  . Number of children: N/A  . Years of education: N/A   Occupational History  . retired Music therapist    Social History Main Topics  . Smoking status: Former Smoker    Packs/day: 1.50    Years: 38.00    Types: Cigarettes    Quit date: 09/03/2000  . Smokeless tobacco: Never Used  . Alcohol use 1.2 - 1.8 oz/week    2 - 3 Cans of beer per week  . Drug use: No  . Sexual activity: No   Other Topics Concern  . Not on file   Social History Narrative   Retired Music therapist   Patient states former smoker. 1 1/2 ppd x 38 yrs  Quit in Jan 2002   Married - 2 weeks (4th marriage)   divorced,  remarried 84-2004 (lost wife to lung ca),  remarried (divorced),    1 son  - 39 [name])   Alcohol use-yes (2-3 beers per week)            Outpatient Medications Prior to Visit  Medication Sig Dispense Refill  .  albuterol (PROVENTIL HFA;VENTOLIN HFA) 108 (90 BASE) MCG/ACT inhaler Inhale 2 puffs into the lungs every 6 (six) hours as needed for wheezing or shortness of breath. 3 Inhaler 3  . amLODipine (NORVASC) 5 MG tablet Take 1 tablet (5 mg total) by mouth daily. 90 tablet 1  . aspirin-acetaminophen-caffeine (EXCEDRIN MIGRAINE) [id number] MG per tablet Take 1 tablet by mouth as needed.     . budesonide-formoterol (SYMBICORT) 160-4.5 MCG/ACT inhaler Inhale 2 puffs into the lungs 2 (two) times daily. 3 Inhaler 0  . calcium carbonate (TUMS EX) 750 MG chewable tablet Chew 1 tablet by mouth 2 (two) times daily.      . fluticasone (FLONASE) 50 MCG/ACT nasal spray USE 2 SPRAYS IN EACH NOSTRIL ONE TIME DAILY 48 g 1  . glucosamine-chondroitin 500-400 MG tablet Take 1 tablet by mouth 2 (two) times daily.      Marland Kitchen guaiFENesin (MUCINEX) 600 MG 12 hr tablet Take 600 mg by mouth 2 (two) times daily. Using Extra at this time    . loratadine (CLARITIN) 10 MG tablet Take 10 mg by mouth daily.    .  meloxicam (MOBIC) 15 MG tablet TAKE 1 TABLET (15 MG TOTAL) BY MOUTH DAILY AS NEEDED FOR PAIN (WITH FOOD). 90 tablet 1  . montelukast (SINGULAIR) 10 MG tablet Take 1 tablet (10 mg total) by mouth daily. 90 tablet 3  . Multiple Vitamin (MULTIVITAMIN) tablet Take 1 tablet by mouth 3 (three) times a week.      . Tiotropium Bromide Monohydrate (SPIRIVA RESPIMAT) 2.5 MCG/ACT AERS Inhale 2 puffs into the lungs daily. 3 Inhaler 1  . Menthol (ROBITUSSIN COUGH DROPS MT) Use as directed in the mouth or throat as needed.    . benzonatate (TESSALON) 100 MG capsule Take 1 capsule (100 mg total) by mouth 3 (three) times daily as needed for cough. 21 capsule 0  . doxycycline (VIBRA-TABS) 100 MG tablet Take 1 tablet (100 mg total) by mouth 2 (two) times daily. 20 tablet 0  . predniSONE (DELTASONE) 10 MG tablet 6 TAB PO DAY 1 5 TAB PO DAY 2 4 TAB PO DAY 3 3 TAB PO DAY 4 2 TAB PO DAY 5 1 TAB PO DAY 6 21 tablet 0   No facility-administered medications prior to visit.     No Known Allergies  Review of Systems  Constitutional: Negative for fever and malaise/fatigue.  HENT: Negative for congestion.   Eyes: Negative for blurred vision.  Respiratory: Negative for shortness of breath.   Cardiovascular: Negative for chest pain, palpitations and leg swelling.  Gastrointestinal: Negative for abdominal pain, blood in stool and nausea.  Genitourinary: Positive for frequency. Negative for dysuria.  Musculoskeletal: Negative for falls.  Skin: Negative for rash.  Neurological: Negative for dizziness, loss of consciousness and headaches.  Endo/Heme/Allergies: Negative for environmental allergies.  Psychiatric/Behavioral: Negative for depression. The patient is not nervous/anxious.        Objective:    Physical Exam  Constitutional: He is oriented to person, place, and time. He appears well-developed and well-nourished. No distress.  HENT:  Head: Normocephalic and atraumatic.  Nose: Nose normal.  Eyes: Right  eye exhibits no discharge. Left eye exhibits no discharge.  Neck: Normal range of motion. Neck supple.  Cardiovascular: Normal rate and regular rhythm.   No murmur heard. Pulmonary/Chest: Effort normal and breath sounds normal.  Abdominal: Soft. Bowel sounds are normal. There is no tenderness.  Musculoskeletal: He exhibits no edema.  Neurological: He is alert and oriented to person,  place, and time.  Skin: Skin is warm and dry.  Psychiatric: He has a normal mood and affect.  Nursing note and vitals reviewed.   BP 122/78 (BP Location: Left Arm, Patient Position: Sitting, Cuff Size: Normal)   Pulse 90   Ht 5\' 7"  (1.702 m)   Wt 148 lb 6.4 oz (67.3 kg)   SpO2 98%   BMI 23.24 kg/m  Wt Readings from Last 3 Encounters:  09/10/16 148 lb 6.4 oz (67.3 kg)  08/27/16 150 lb 6.4 oz (68.2 kg)  07/09/16 148 lb 8 oz (67.4 kg)     Lab Results  Component Value Date   WBC 7.8 08/27/2016   HGB 15.2 08/27/2016   HCT 43.7 08/27/2016   PLT 262.0 08/27/2016   GLUCOSE 89 09/10/2016   CHOL 181 09/10/2016   TRIG 144.0 09/10/2016   HDL 61.30 09/10/2016   LDLDIRECT 85 07/22/2014   LDLCALC 91 09/10/2016   ALT 24 09/10/2016   AST 27 09/10/2016   NA 142 09/10/2016   K 4.1 09/10/2016   CL 106 09/10/2016   CREATININE 0.86 09/10/2016   BUN 28 (H) 09/10/2016   CO2 25 09/10/2016   TSH 0.42 09/10/2016   PSA 1.91 09/10/2016   INR 1.07 06/28/2009   HGBA1C 5.7 07/22/2014    Lab Results  Component Value Date   TSH 0.42 09/10/2016   Lab Results  Component Value Date   WBC 7.8 08/27/2016   HGB 15.2 08/27/2016   HCT 43.7 08/27/2016   MCV 91.3 08/27/2016   PLT 262.0 08/27/2016   Lab Results  Component Value Date   NA 142 09/10/2016   K 4.1 09/10/2016   CO2 25 09/10/2016   GLUCOSE 89 09/10/2016   BUN 28 (H) 09/10/2016   CREATININE 0.86 09/10/2016   BILITOT 0.6 09/10/2016   ALKPHOS 74 09/10/2016   AST 27 09/10/2016   ALT 24 09/10/2016   PROT 6.4 09/10/2016   ALBUMIN 3.8 09/10/2016    CALCIUM 9.2 09/10/2016   GFR 93.48 09/10/2016   Lab Results  Component Value Date   CHOL 181 09/10/2016   Lab Results  Component Value Date   HDL 61.30 09/10/2016   Lab Results  Component Value Date   LDLCALC 91 09/10/2016   Lab Results  Component Value Date   TRIG 144.0 09/10/2016   Lab Results  Component Value Date   CHOLHDL 3 09/10/2016   Lab Results  Component Value Date   HGBA1C 5.7 07/22/2014       Assessment & Plan:   Problem List Items Addressed This Visit    Benign essential HTN    Well controlled, no changes to meds. Encouraged heart healthy diet such as the DASH diet and exercise as tolerated.       Relevant Orders   Comprehensive metabolic panel (Completed)   TSH (Completed)   Lipid panel (Completed)   COPD (chronic obstructive pulmonary disease) ([place])    Has worst trouble at 5000 feet.       GERD    Avoiding offending foods, take probiotics. Do not eat large meals in late evening and consider raising head of bed. Report any worsening symptoms, is doing well at this time.      Relevant Orders   Lipid panel (Completed)    Other Visit Diagnoses    Preventative health care    -  Primary   Relevant Orders   Comprehensive metabolic panel (Completed)   TSH (Completed)   PSA (Completed)   Lipid  panel (Completed)   Urinary frequency       Relevant Orders   PSA (Completed)   Actinic keratosis       Relevant Orders   Ambulatory referral to Dermatology      I have discontinued Mr. Perdew doxycycline, predniSONE, and benzonatate. I am also having him maintain his glucosamine-chondroitin, guaiFENesin, multivitamin, calcium carbonate, aspirin-acetaminophen-caffeine, loratadine, Menthol (ROBITUSSIN COUGH DROPS MT), albuterol, montelukast, amLODipine, fluticasone, meloxicam, budesonide-formoterol, and Tiotropium Bromide Monohydrate.  No orders of the defined types were placed in this encounter.    Penni Homans, MD

## 2016-09-12 ENCOUNTER — Encounter: Payer: Self-pay | Admitting: Family Medicine

## 2016-09-18 ENCOUNTER — Encounter: Payer: Commercial Managed Care - HMO | Admitting: Acute Care

## 2016-09-18 ENCOUNTER — Ambulatory Visit: Payer: Commercial Managed Care - HMO

## 2016-09-20 DIAGNOSIS — L570 Actinic keratosis: Secondary | ICD-10-CM | POA: Diagnosis not present

## 2016-09-20 DIAGNOSIS — D2372 Other benign neoplasm of skin of left lower limb, including hip: Secondary | ICD-10-CM | POA: Diagnosis not present

## 2016-09-20 DIAGNOSIS — D485 Neoplasm of uncertain behavior of skin: Secondary | ICD-10-CM | POA: Diagnosis not present

## 2016-09-28 ENCOUNTER — Encounter (HOSPITAL_BASED_OUTPATIENT_CLINIC_OR_DEPARTMENT_OTHER): Payer: Self-pay | Admitting: *Deleted

## 2016-09-28 ENCOUNTER — Emergency Department (HOSPITAL_BASED_OUTPATIENT_CLINIC_OR_DEPARTMENT_OTHER): Payer: Medicare HMO

## 2016-09-28 ENCOUNTER — Inpatient Hospital Stay (HOSPITAL_BASED_OUTPATIENT_CLINIC_OR_DEPARTMENT_OTHER)
Admission: EM | Admit: 2016-09-28 | Discharge: 2016-09-30 | DRG: 195 | Disposition: A | Discharge status: Home / Self Care | Payer: Medicare HMO | Attending: Internal Medicine | Admitting: Internal Medicine

## 2016-09-28 DIAGNOSIS — Z87891 Personal history of nicotine dependence: Secondary | ICD-10-CM | POA: Diagnosis not present

## 2016-09-28 DIAGNOSIS — I10 Essential (primary) hypertension: Secondary | ICD-10-CM | POA: Diagnosis not present

## 2016-09-28 DIAGNOSIS — Z7951 Long term (current) use of inhaled steroids: Secondary | ICD-10-CM

## 2016-09-28 DIAGNOSIS — R0602 Shortness of breath: Secondary | ICD-10-CM

## 2016-09-28 DIAGNOSIS — J309 Allergic rhinitis, unspecified: Secondary | ICD-10-CM | POA: Diagnosis present

## 2016-09-28 DIAGNOSIS — Z8669 Personal history of other diseases of the nervous system and sense organs: Secondary | ICD-10-CM

## 2016-09-28 DIAGNOSIS — K219 Gastro-esophageal reflux disease without esophagitis: Secondary | ICD-10-CM | POA: Diagnosis not present

## 2016-09-28 DIAGNOSIS — Z823 Family history of stroke: Secondary | ICD-10-CM

## 2016-09-28 DIAGNOSIS — Z8249 Family history of ischemic heart disease and other diseases of the circulatory system: Secondary | ICD-10-CM

## 2016-09-28 DIAGNOSIS — R0609 Other forms of dyspnea: Secondary | ICD-10-CM

## 2016-09-28 DIAGNOSIS — R0600 Dyspnea, unspecified: Secondary | ICD-10-CM | POA: Diagnosis not present

## 2016-09-28 DIAGNOSIS — J439 Emphysema, unspecified: Secondary | ICD-10-CM

## 2016-09-28 DIAGNOSIS — Z79899 Other long term (current) drug therapy: Secondary | ICD-10-CM | POA: Diagnosis not present

## 2016-09-28 DIAGNOSIS — Z902 Acquired absence of lung [part of]: Secondary | ICD-10-CM

## 2016-09-28 DIAGNOSIS — E876 Hypokalemia: Secondary | ICD-10-CM | POA: Diagnosis present

## 2016-09-28 DIAGNOSIS — J181 Lobar pneumonia, unspecified organism: Secondary | ICD-10-CM

## 2016-09-28 DIAGNOSIS — J441 Chronic obstructive pulmonary disease with (acute) exacerbation: Secondary | ICD-10-CM | POA: Diagnosis not present

## 2016-09-28 DIAGNOSIS — J189 Pneumonia, unspecified organism: Principal | ICD-10-CM

## 2016-09-28 DIAGNOSIS — M1990 Unspecified osteoarthritis, unspecified site: Secondary | ICD-10-CM | POA: Diagnosis not present

## 2016-09-28 DIAGNOSIS — J449 Chronic obstructive pulmonary disease, unspecified: Secondary | ICD-10-CM | POA: Diagnosis present

## 2016-09-28 DIAGNOSIS — J438 Other emphysema: Secondary | ICD-10-CM | POA: Diagnosis not present

## 2016-09-28 DIAGNOSIS — R05 Cough: Secondary | ICD-10-CM | POA: Diagnosis not present

## 2016-09-28 LAB — COMPREHENSIVE METABOLIC PANEL
ALT: 20 U/L (ref 17–63)
AST: 26 U/L (ref 15–41)
Albumin: 3.5 g/dL (ref 3.5–5.0)
Alkaline Phosphatase: 55 U/L (ref 38–126)
Anion gap: 6 (ref 5–15)
BUN: 19 mg/dL (ref 6–20)
CO2: 24 mmol/L (ref 22–32)
Calcium: 8.7 mg/dL — ABNORMAL LOW (ref 8.9–10.3)
Chloride: 106 mmol/L (ref 101–111)
Creatinine, Ser: 0.82 mg/dL (ref 0.61–1.24)
GFR calc Af Amer: >60 mL/min (ref >60)
GFR calc non Af Amer: >60 mL/min (ref >60)
Glucose, Bld: 107 mg/dL — ABNORMAL HIGH (ref 65–99)
Potassium: 3.2 mmol/L — ABNORMAL LOW (ref 3.5–5.1)
Sodium: 136 mmol/L (ref 135–145)
Total Bilirubin: 1 mg/dL (ref 0.3–1.2)
Total Protein: 6.8 g/dL (ref 6.5–8.1)

## 2016-09-28 LAB — CBC WITH DIFFERENTIAL/PLATELET
Basophils Absolute: 0 10*3/uL (ref 0.0–0.1)
Basophils Relative: 0 %
Eosinophils Absolute: 0.1 10*3/uL (ref 0.0–0.7)
Eosinophils Relative: 1 %
HCT: 39.3 % (ref 39.0–52.0)
Hemoglobin: 13.3 g/dL (ref 13.0–17.0)
Lymphocytes Relative: 16 %
Lymphs Abs: 1.5 10*3/uL (ref 0.7–4.0)
MCH: 31.7 pg (ref 26.0–34.0)
MCHC: 33.8 g/dL (ref 30.0–36.0)
MCV: 93.8 fL (ref 78.0–100.0)
Monocytes Absolute: 0.8 10*3/uL (ref 0.1–1.0)
Monocytes Relative: 9 %
Neutro Abs: 6.9 10*3/uL (ref 1.7–7.7)
Neutrophils Relative %: 74 %
Platelets: 229 10*3/uL (ref 150–400)
RBC: 4.19 MIL/uL — ABNORMAL LOW (ref 4.22–5.81)
RDW: 13 % (ref 11.5–15.5)
WBC: 9.3 10*3/uL (ref 4.0–10.5)

## 2016-09-28 LAB — URINALYSIS, ROUTINE W REFLEX MICROSCOPIC
Bilirubin Urine: NEGATIVE
Glucose, UA: NEGATIVE mg/dL
Hgb urine dipstick: NEGATIVE
Ketones, ur: 15 mg/dL — AB
Leukocytes, UA: NEGATIVE
Nitrite: NEGATIVE
Protein, ur: NEGATIVE mg/dL
Specific Gravity, Urine: 1.018 (ref 1.005–1.030)
pH: 6.5 (ref 5.0–8.0)

## 2016-09-28 LAB — I-STAT CG4 LACTIC ACID, ED: Lactic Acid, Venous: 0.8 mmol/L (ref 0.5–1.9)

## 2016-09-28 LAB — PROTIME-INR
INR: 1.18
Prothrombin Time: 15.1 seconds (ref 11.4–15.2)

## 2016-09-28 LAB — INFLUENZA PANEL BY PCR (TYPE A & B)
Influenza A By PCR: NEGATIVE
Influenza B By PCR: NEGATIVE

## 2016-09-28 MED ORDER — DEXTROSE 5 % IV SOLN
1.0000 g | INTRAVENOUS | Status: DC
  Filled 2016-09-28: qty 10

## 2016-09-28 MED ORDER — OSELTAMIVIR PHOSPHATE 75 MG PO CAPS
75.0000 mg | ORAL_CAPSULE | Freq: Once | ORAL | Status: DC
  Filled 2016-09-28: qty 1

## 2016-09-28 MED ORDER — SODIUM CHLORIDE 0.9 % IV SOLN
1000.0000 mL | INTRAVENOUS | Status: DC
  Administered 2016-09-28 – 2016-09-30 (×2): 1000 mL via INTRAVENOUS

## 2016-09-28 MED ORDER — AZITHROMYCIN 500 MG IV SOLR
INTRAVENOUS | Status: AC
Start: 2016-09-28 — End: 2016-09-28
  Filled 2016-09-28: qty 500

## 2016-09-28 MED ORDER — IPRATROPIUM-ALBUTEROL 0.5-2.5 (3) MG/3ML IN SOLN: 3.0000 mL | RESPIRATORY_TRACT | Status: DC | PRN

## 2016-09-28 MED ORDER — OSELTAMIVIR PHOSPHATE 75 MG PO CAPS
75.0000 mg | ORAL_CAPSULE | Freq: Two times a day (BID) | ORAL | Status: DC
  Administered 2016-09-28 – 2016-09-29 (×2): 75 mg via ORAL
  Filled 2016-09-28 (×3): qty 1

## 2016-09-28 MED ORDER — MONTELUKAST SODIUM 10 MG PO TABS
10.0000 mg | ORAL_TABLET | Freq: Every day | ORAL | Status: DC
  Administered 2016-09-28 – 2016-09-30 (×3): 10 mg via ORAL
  Filled 2016-09-28 (×3): qty 1

## 2016-09-28 MED ORDER — MOMETASONE FURO-FORMOTEROL FUM 200-5 MCG/ACT IN AERO
2.0000 | INHALATION_SPRAY | Freq: Two times a day (BID) | RESPIRATORY_TRACT | Status: DC
  Administered 2016-09-28 – 2016-09-30 (×4): 2 via RESPIRATORY_TRACT
  Filled 2016-09-28: qty 8.8

## 2016-09-28 MED ORDER — AMLODIPINE BESYLATE 5 MG PO TABS
5.0000 mg | ORAL_TABLET | Freq: Every day | ORAL | Status: DC
  Administered 2016-09-28 – 2016-09-30 (×3): 5 mg via ORAL
  Filled 2016-09-28 (×3): qty 1

## 2016-09-28 MED ORDER — ASPIRIN-ACETAMINOPHEN-CAFFEINE 250-250-65 MG PO TABS
1.0000 | ORAL_TABLET | Freq: Three times a day (TID) | ORAL | Status: DC | PRN
  Administered 2016-09-29 – 2016-09-30 (×2): 1 via ORAL
  Filled 2016-09-28 (×6): qty 1

## 2016-09-28 MED ORDER — DEXTROSE 5 % IV SOLN: 500.0000 mg | INTRAVENOUS | Status: DC

## 2016-09-28 MED ORDER — ALBUTEROL SULFATE (2.5 MG/3ML) 0.083% IN NEBU: 2.5000 mg | INHALATION_SOLUTION | RESPIRATORY_TRACT | Status: DC | PRN

## 2016-09-28 MED ORDER — OSELTAMIVIR PHOSPHATE 75 MG PO CAPS
75.0000 mg | ORAL_CAPSULE | Freq: Once | ORAL | Status: AC
  Administered 2016-09-28: 75 mg via ORAL
  Filled 2016-09-28: qty 1

## 2016-09-28 MED ORDER — SODIUM CHLORIDE 0.9 % IV SOLN
1000.0000 mL | INTRAVENOUS | Status: DC
  Administered 2016-09-28: 1000 mL via INTRAVENOUS

## 2016-09-28 MED ORDER — ENOXAPARIN SODIUM 40 MG/0.4ML ~~LOC~~ SOLN
40.0000 mg | SUBCUTANEOUS | Status: DC
  Administered 2016-09-28 – 2016-09-29 (×2): 40 mg via SUBCUTANEOUS
  Filled 2016-09-28 (×2): qty 0.4

## 2016-09-28 MED ORDER — DEXTROSE 5 % IV SOLN
1.0000 g | Freq: Once | INTRAVENOUS | Status: AC
  Administered 2016-09-28: 1 g via INTRAVENOUS
  Filled 2016-09-28: qty 10

## 2016-09-28 MED ORDER — DEXTROSE 5 % IV SOLN
500.0000 mg | INTRAVENOUS | Status: DC
  Administered 2016-09-29 – 2016-09-30 (×2): 500 mg via INTRAVENOUS
  Filled 2016-09-28 (×2): qty 500

## 2016-09-28 MED ORDER — ALBUTEROL SULFATE (2.5 MG/3ML) 0.083% IN NEBU
2.5000 mg | INHALATION_SOLUTION | Freq: Once | RESPIRATORY_TRACT | Status: AC
  Administered 2016-09-28: 2.5 mg via RESPIRATORY_TRACT
  Filled 2016-09-28: qty 3

## 2016-09-28 MED ORDER — GUAIFENESIN ER 600 MG PO TB12
600.0000 mg | ORAL_TABLET | Freq: Two times a day (BID) | ORAL | Status: DC
  Administered 2016-09-28 – 2016-09-30 (×4): 600 mg via ORAL
  Filled 2016-09-28 (×4): qty 1

## 2016-09-28 MED ORDER — CEFTRIAXONE SODIUM 1 G IJ SOLR
1.0000 g | INTRAMUSCULAR | Status: DC
  Administered 2016-09-29 – 2016-09-30 (×2): 1 g via INTRAVENOUS
  Filled 2016-09-28 (×2): qty 10

## 2016-09-28 MED ORDER — TIOTROPIUM BROMIDE MONOHYDRATE 18 MCG IN CAPS
1.0000 | ORAL_CAPSULE | Freq: Every day | RESPIRATORY_TRACT | Status: DC
  Administered 2016-09-29 – 2016-09-30 (×2): 18 ug via RESPIRATORY_TRACT
  Filled 2016-09-28: qty 5

## 2016-09-28 MED ORDER — IPRATROPIUM-ALBUTEROL 0.5-2.5 (3) MG/3ML IN SOLN
3.0000 mL | Freq: Once | RESPIRATORY_TRACT | Status: AC
  Administered 2016-09-28: 3 mL via RESPIRATORY_TRACT
  Filled 2016-09-28: qty 3

## 2016-09-28 MED ORDER — DEXTROSE 5 % IV SOLN
500.0000 mg | Freq: Once | INTRAVENOUS | Status: AC
  Administered 2016-09-28: 500 mg via INTRAVENOUS

## 2016-09-28 NOTE — ED Provider Notes (Signed)
[facility] DEPT [facility] Provider Note   CSN: [id number] Arrival date & time: 09/28/16  0343     History   Chief Complaint Chief Complaint  Patient presents with  . Shortness of Breath    HPI [name] is a 71 y.o. male.  HPI  Pt comes in with cc of dib. Pt has hx of COPD/emphysema and is s/p R sided lobectomy. Pt reports that he started having some cold like symptoms 2 days ago. yday however he developed cough with brown phlegm, and in the evening he started having shortness of breath. Pt usually is able to walk fair amount of distance and talks his dog on a walk daily, but he noted that walking from his room to Xrays area, about 100 feet, got him short of breath here. Pt is noted to have to stop halfway through sentences on occasion to take a deep breath.  Past Medical History:  Diagnosis Date  . Allergic rhinitis   . Atypical chest pain 12/01/2014  . Back pain 01/12/2013  . COPD (chronic obstructive pulmonary disease) (HCC)    FeV1 64%-2007  . Emphysema   . GERD (gastroesophageal reflux disease)   . History of lung abscess    bronchiectasis with RMLandRLL ersection -1996- Dr Arlyce Dice  . Hordeolum externum (stye) 04/23/2015   Right eye  . Hypertension   . Impaired vision    glasses  . Osteoarthritis    hands and knees  . Sinusitis, acute 04/23/2015    Patient Active Problem List   Diagnosis Date Noted  . Atypical chest pain 12/01/2014  . Screening for ischemic heart disease 07/25/2014  . Prostate cancer screening 07/25/2014  . Medicare annual wellness visit, subsequent 07/25/2014  . Asymptomatic PVCs 07/25/2014  . Arthritis 10/18/2013  . Incomplete RBBB 07/19/2013  . Back pain 01/12/2013  . Annual physical exam 07/21/2011  . Carotid bruit 07/21/2011  . Benign essential HTN 05/23/2010  . Allergic rhinitis 07/07/2007  . COPD (chronic obstructive pulmonary disease) ([place]) 07/07/2007  . GERD 07/07/2007    Past Surgical History:  Procedure Laterality Date    . HERNIA REPAIR  10/2009  . left knee    . lower back surgery  09/2008   06/2009  . LUNG SURGERY     RML andRUL removed due to bleeding and bronchiectasis and lung abcess  . NECK SURGERY  05/2008  . right foot surgery         Home Medications    Prior to Admission medications   Medication Sig Start Date End Date Taking? Authorizing Provider  albuterol (PROVENTIL HFA;VENTOLIN HFA) 108 (90 BASE) MCG/ACT inhaler Inhale 2 puffs into the lungs every 6 (six) hours as needed for wheezing or shortness of breath. 08/10/14   Elsie Stain, MD  amLODipine (NORVASC) 5 MG tablet Take 1 tablet (5 mg total) by mouth daily. 06/06/16   Mosie Lukes, MD  aspirin-acetaminophen-caffeine (EXCEDRIN MIGRAINE) [phone] MG per tablet Take 1 tablet by mouth as needed.     Historical Provider, MD  budesonide-formoterol (SYMBICORT) 160-4.5 MCG/ACT inhaler Inhale 2 puffs into the lungs 2 (two) times daily. 06/10/16   Mosie Lukes, MD  calcium carbonate (TUMS EX) 750 MG chewable tablet Chew 1 tablet by mouth 2 (two) times daily.      Historical Provider, MD  fluticasone (FLONASE) 50 MCG/ACT nasal spray USE 2 SPRAYS IN EACH NOSTRIL ONE TIME DAILY 06/06/16   Mosie Lukes, MD  glucosamine-chondroitin 500-400 MG tablet Take 1 tablet by  mouth 2 (two) times daily.      Historical Provider, MD  guaiFENesin (MUCINEX) 600 MG 12 hr tablet Take 600 mg by mouth 2 (two) times daily. Using Extra at this time    Historical Provider, MD  loratadine (CLARITIN) 10 MG tablet Take 10 mg by mouth daily.    Historical Provider, MD  meloxicam (MOBIC) 15 MG tablet TAKE 1 TABLET (15 MG TOTAL) BY MOUTH DAILY AS NEEDED FOR PAIN (WITH FOOD). 06/06/16   Mosie Lukes, MD  Menthol (ROBITUSSIN COUGH DROPS MT) Use as directed in the mouth or throat as needed.    Historical Provider, MD  montelukast (SINGULAIR) 10 MG tablet Take 1 tablet (10 mg total) by mouth daily. 12/19/15   Mosie Lukes, MD  Multiple Vitamin (MULTIVITAMIN) tablet Take 1  tablet by mouth 3 (three) times a week.      Historical Provider, MD  Tiotropium Bromide Monohydrate (SPIRIVA RESPIMAT) 2.5 MCG/ACT AERS Inhale 2 puffs into the lungs daily. 06/10/16   Rigoberto Noel, MD    Family History Family History  Problem Relation Age of Onset  . Prostate cancer Father     father died prostate ca  . Coronary artery disease      1st degree relative<60  . Stroke      1st degree relative<50  . Heart failure Mother     age 70  . Colon cancer Paternal Grandmother   . Obstructive Sleep Apnea Brother   . Obesity Brother     Social History Social History  Substance Use Topics  . Smoking status: Former Smoker    Packs/day: 1.50    Years: 38.00    Types: Cigarettes    Quit date: 09/03/2000  . Smokeless tobacco: Never Used  . Alcohol use 1.2 - 1.8 oz/week    2 - 3 Cans of beer per week     Allergies   Patient has no known allergies.   Review of Systems Review of Systems  ROS 10 Systems reviewed and are negative for acute change except as noted in the HPI.     Physical Exam Updated Vital Signs BP 136/72 (BP Location: Left Arm)   Pulse 96   Temp 98.6 F (37 C) (Oral)   Resp 18   Ht 5\' 7"  (1.702 m)   Wt 148 lb (67.1 kg)   SpO2 96%   BMI 23.18 kg/m   Physical Exam  Constitutional: He is oriented to person, place, and time. He appears well-developed.  HENT:  Head: Normocephalic and atraumatic.  Eyes: Conjunctivae and EOM are normal. Pupils are equal, round, and reactive to light.  Neck: Normal range of motion. Neck supple.  Cardiovascular: Normal rate and regular rhythm.   Pulmonary/Chest: Breath sounds normal. He is in respiratory distress.  L sided rhonchi  Abdominal: Soft. Bowel sounds are normal. He exhibits no distension. There is no tenderness. There is no rebound and no guarding.  Neurological: He is alert and oriented to person, place, and time.  Skin: Skin is warm.  Nursing note and vitals reviewed.    ED Treatments / Results    Labs (all labs ordered are listed, but only abnormal results are displayed) Labs Reviewed  COMPREHENSIVE METABOLIC PANEL - Abnormal; Notable for the following:       Result Value   Potassium 3.2 (*)    Glucose, Bld 107 (*)    Calcium 8.7 (*)    All other components within normal limits  CBC WITH DIFFERENTIAL/PLATELET -  Abnormal; Notable for the following:    RBC 4.19 (*)    All other components within normal limits  URINALYSIS, ROUTINE W REFLEX MICROSCOPIC - Abnormal; Notable for the following:    Ketones, ur 15 (*)    All other components within normal limits  CULTURE, BLOOD (ROUTINE X 2)  CULTURE, BLOOD (ROUTINE X 2)  URINE CULTURE  PROTIME-INR  INFLUENZA PANEL BY PCR (TYPE A & B)  I-STAT CG4 LACTIC ACID, ED    EKG  EKG Interpretation  Date/Time:  Saturday September 28 2016 06:08:59 EST Ventricular Rate:  95 PR Interval:    QRS Duration: 111 QT Interval:  366 QTC Calculation: 461 R Axis:   14 Text Interpretation:  Sinus tachycardia Multiple premature complexes, vent & supraven RSR' in V1 or V2, right VCD or RVH No acute changes No significant change since last tracing Confirmed by Kathrynn Humble, MD, Thelma Comp [id number]) on 09/28/2016 7:00:56 AM       Radiology Dg Chest 2 View  Result Date: 09/28/2016 CLINICAL DATA:  Initial evaluation for acute shortness of breath, cough, fever. EXAM: CHEST  2 VIEW COMPARISON:  Prior radiograph from 08/27/2016. FINDINGS: Cardiac and mediastinal silhouettes are stable, and remain within normal limits. Post treatment/ postsurgical changes present at the right hemithorax with associated volume loss and scarring, stable. Right lung is grossly clear. There is patchy opacity at the mid left lung base, concerning for possible infiltrate. Left lung is otherwise clear. No pulmonary edema or pleural effusion. No pneumothorax. No acute osseus abnormality. Postsurgical changes noted within the lower lumbar spine. Cervical ACDF noted as well. IMPRESSION: 1. Patchy  left basilar opacity, concerning for pneumonia given the history of cough and fever. 2. Stable post treatment changes within the right lung with associated volume loss. Electronically Signed   By: Jeannine Boga M.D.   On: 09/28/2016 05:21    Procedures Procedures (including critical care time)  Medications Ordered in ED Medications  0.9 %  sodium chloride infusion (1,000 mLs Intravenous New Bag/Given 09/28/16 0639)  cefTRIAXone (ROCEPHIN) 1 g in dextrose 5 % 50 mL IVPB (1 g Intravenous New Bag/Given 09/28/16 0704)  azithromycin (ZITHROMAX) 500 mg in dextrose 5 % 250 mL IVPB (not administered)  azithromycin (ZITHROMAX) 500 mg in dextrose 5 % 250 mL IVPB (not administered)  cefTRIAXone (ROCEPHIN) 1 g in dextrose 5 % 50 mL IVPB (not administered)  ipratropium-albuterol (DUONEB) 0.5-2.5 (3) MG/3ML nebulizer solution 3 mL (3 mLs Nebulization Given 09/28/16 0407)  albuterol (PROVENTIL) (2.5 MG/3ML) 0.083% nebulizer solution 2.5 mg (2.5 mg Nebulization Given 09/28/16 0407)     Initial Impression / Assessment and Plan / ED Course  I have reviewed the triage vital signs and the nursing notes.  Pertinent labs & imaging results that were available during my care of the patient were reviewed by me and considered in my medical decision making (see chart for details).     Pt comes in with cc of DIB. Pt has hx of R sided lobectomy, and he has COPD and emphysema. Pt on exam has slight resp distress and he has new dyspnea on exertion. CXR confirms PNA.  What's alarming is that within a matter of few hours pt started developing dyspnea on exertion, and my fear is that if he is discharged prematurely, he can get even sicker and need longer admission and face unnecessary complications. Therefore, plan is to admit the patient with empiric treatment for CAP.  Dr. Annamaria Boots has requested inpatient bed. She also would prefer that  we start tamiflu now, and they can d/c it if the flu swab is neg.  Final Clinical  Impressions(s) / ED Diagnoses   Final diagnoses:  Community acquired pneumonia of left lower lobe of lung ([place])  Dyspnea on exertion    New Prescriptions New Prescriptions   No medications on file     Varney Biles, MD 09/28/16 [id number]

## 2016-09-28 NOTE — Progress Notes (Signed)
Pharmacy Antibiotic Note  [name] is a 71 y.o. male admitted on 09/28/2016 with CAP.  Pharmacy has been consulted for Azithromycin and Rocephin dosing.  Plan: Azithromycin 500mg  IV q24h Rocephin 1gm IV q24h Pharmacy will sign off - please reconsult if needed  Height: 5\' 7"  (170.2 cm) Weight: 148 lb (67.1 kg) IBW/kg (Calculated) : 66.1  Temp (24hrs), Avg:98.6 F (37 C), Min:98.6 F (37 C), Max:98.6 F (37 C)  No results for input(s): WBC, CREATININE, LATICACIDVEN, VANCOTROUGH, VANCOPEAK, VANCORANDOM, GENTTROUGH, GENTPEAK, GENTRANDOM, TOBRATROUGH, TOBRAPEAK, TOBRARND, AMIKACINPEAK, AMIKACINTROU, AMIKACIN in the last 168 hours.  Estimated Creatinine Clearance: 75.8 mL/min (by C-G formula based on SCr of 0.86 mg/dL).    No Known Allergies  Thank you for allowing pharmacy to be a part of this patient's care.  Sherlon Handing, PharmD, BCPS Clinical pharmacist, pager [phone] 09/28/2016 5:56 AM

## 2016-09-28 NOTE — ED Triage Notes (Signed)
C/o sob, cough productive, brownish, aching  Low grade fever past day

## 2016-09-28 NOTE — H&P (Signed)
History and Physical    [name] [id number] DOB: [birth year] DOA: 09/28/2016   PCP: Ruben Homans, MD   Patient coming from:  Home   Chief Complaint: Shortness of breath and cough   HPI: [name] is a 71 y.o. male with medical history significant for COPD/ emphysema, R lung lobectomy due to lung abscess, brought from [facility] for treatment of CAP. In review, patient had been experiencing increased productive cough since last Thursday. This is following a recent COPD exacerbation as outpatient  for which he had taken prednisone and inhalers, without resolution of symtpoms. This morning he noted increased dyspnea at rest, myalgia, chills and T max up to 99. He noted some redness in his sputum. He is not aware of sick contacts.  Denies any chest pain, chest wall pain or palpitations.Denies any long distance travels. Denies any abdominal pain. Appetite is somewhat decreased over the last day due to symptoms without  nausea or vomiting. Denies dizziness or vertigo. Denies lower extremity swelling. No confusion was reported. Denies any vision changes, double vision or headaches.    ED Course:  BP (!) 133/91 (BP Location: Left Arm)   Pulse 95   Temp 99.5 F (37.5 C) (Oral)   Resp 20   Ht 5\' 7"  (1.702 m)   Wt 67.1 kg (148 lb)   SpO2 94%   BMI 23.18 kg/m   Patchy left basilar opacity, concerning for pneumonia  Received Zithromax and Rocephin IV, in addition to Tamiflu, Dunoneb and albuterol, IVF  Influenza panel pending Urine and Blood culture pending  Lactic acid 0.8 WBC 9.3  EKG S tach without ACS   Review of Systems: As per HPI otherwise 10 point review of systems negative.   Past Medical History:  Diagnosis Date  . Allergic rhinitis   . Atypical chest pain 12/01/2014  . Back pain 01/12/2013  . COPD (chronic obstructive pulmonary disease) (HCC)    FeV1 64%-2007  . Emphysema   . GERD (gastroesophageal reflux disease)   . History of lung abscess    bronchiectasis with  RMLandRLL ersection -1996- Dr [name]  . Hordeolum externum (stye) 04/23/2015   Right eye  . Hypertension   . Impaired vision    glasses  . Osteoarthritis    hands and knees  . Sinusitis, acute 04/23/2015    Past Surgical History:  Procedure Laterality Date  . HERNIA REPAIR  10/2009  . left knee    . lower back surgery  09/2008   06/2009  . LUNG SURGERY     RML andRUL removed due to bleeding and bronchiectasis and lung abcess  . NECK SURGERY  05/2008  . right foot surgery      Social History Social History   Social History  . Marital status: Married    Spouse name: N/A  . Number of children: N/A  . Years of education: N/A   Occupational History  . retired Music therapist    Social History Main Topics  . Smoking status: Former Smoker    Packs/day: 1.50    Years: 38.00    Types: Cigarettes    Quit date: 09/03/2000  . Smokeless tobacco: Never Used  . Alcohol use 1.2 - 1.8 oz/week    2 - 3 Cans of beer per week  . Drug use: No  . Sexual activity: No   Other Topics Concern  . Not on [name]   Social History Narrative   Retired Music therapist   Patient states  former smoker. 1 1/2 ppd x 38 yrs  Quit in Jan 2002   Married - 2 weeks (4th marriage)   divorced,  remarried 84-2004 (lost wife to lung ca),  remarried (divorced),    1 son  - 8 [name])   Alcohol use-yes (2-3 beers per week)             No Known Allergies  Family History  Problem Relation Age of Onset  . Prostate cancer Father     father died prostate ca  . Coronary artery disease      1st degree relative<60  . Stroke      1st degree relative<50  . Heart failure Mother     age 63  . Colon cancer Paternal Grandmother   . Obstructive Sleep Apnea Brother   . Obesity Brother       Prior to Admission medications   Medication Sig Start Date End Date Taking? Authorizing Provider  albuterol (PROVENTIL HFA;VENTOLIN HFA) 108 (90 BASE) MCG/ACT inhaler Inhale 2 puffs into the lungs every 6  (six) hours as needed for wheezing or shortness of breath. 08/10/14  Yes Elsie Stain, MD  amLODipine (NORVASC) 5 MG tablet Take 1 tablet (5 mg total) by mouth daily. 06/06/16  Yes Mosie Lukes, MD  aspirin-acetaminophen-caffeine (EXCEDRIN MIGRAINE) [phone] MG per tablet Take 1 tablet by mouth as needed.    Yes Historical Provider, MD  budesonide-formoterol (SYMBICORT) 160-4.5 MCG/ACT inhaler Inhale 2 puffs into the lungs 2 (two) times daily. 06/10/16  Yes Mosie Lukes, MD  calcium carbonate (TUMS EX) 750 MG chewable tablet Chew 1 tablet by mouth 2 (two) times daily.     Yes Historical Provider, MD  fluticasone (FLONASE) 50 MCG/ACT nasal spray USE 2 SPRAYS IN EACH NOSTRIL ONE TIME DAILY 06/06/16  Yes Mosie Lukes, MD  guaiFENesin (MUCINEX) 600 MG 12 hr tablet Take 600 mg by mouth 2 (two) times daily. Using Extra at this time   Yes Historical Provider, MD  loratadine (CLARITIN) 10 MG tablet Take 10 mg by mouth daily.   Yes Historical Provider, MD  meloxicam (MOBIC) 15 MG tablet TAKE 1 TABLET (15 MG TOTAL) BY MOUTH DAILY AS NEEDED FOR PAIN (WITH FOOD). 06/06/16  Yes Mosie Lukes, MD  Menthol (ROBITUSSIN COUGH DROPS MT) Use as directed in the mouth or throat as needed.   Yes Historical Provider, MD  montelukast (SINGULAIR) 10 MG tablet Take 1 tablet (10 mg total) by mouth daily. 12/19/15  Yes Mosie Lukes, MD  Tiotropium Bromide Monohydrate (SPIRIVA RESPIMAT) 2.5 MCG/ACT AERS Inhale 2 puffs into the lungs daily. 06/10/16  Yes Rigoberto Noel, MD  glucosamine-chondroitin 500-400 MG tablet Take 1 tablet by mouth 2 (two) times daily.      Historical Provider, MD  Multiple Vitamin (MULTIVITAMIN) tablet Take 1 tablet by mouth 3 (three) times a week.      Historical Provider, MD    Physical Exam:  Vitals:   09/28/16 [id number] 09/28/16 0827 09/28/16 0854 09/28/16 1112  BP: 136/72  151/78 (!) 133/91  Pulse: 96   95  Resp: 18  18 20   Temp:   98.4 F (36.9 C) 99.5 F (37.5 C)  TempSrc:   Oral Oral    SpO2: 96% 94% 98% 94%  Weight:      Height:       Constitutional: NAD, calm, comfortable  Eyes: PERRL, lids and conjunctivae normal ENMT: Mucous membranes are moist, without exudate or lesions  Neck: normal,  supple, no masses, no thyromegaly Respiratory:   no wheezing, minimal  Crackles on the left base . Normal respiratory effort . Decreased breath sounds on the right (chronic)  Cardiovascular: Regular rate and rhythm, no murmurs / rubs / gallops. No extremity edema. 2+ pedal pulses. No carotid bruits.  Abdomen: Soft, non tender, No hepatosplenomegaly. Bowel sounds positive.  Musculoskeletal: no clubbing / cyanosis. Moves all extremities Skin: no jaundice, No lesions. Very dry  Neurologic: Sensation intact  Strength equal in all extremities  Psychiatric:   Alert and oriented x 3. Normal mood.     Labs on Admission: I have personally reviewed following labs and imaging studies  CBC:  Recent Labs Lab 09/28/16 0625  WBC 9.3  NEUTROABS 6.9  HGB 13.3  HCT 39.3  MCV 93.8  PLT [id number]    Basic Metabolic Panel:  Recent Labs Lab 09/28/16 0625  NA 136  K 3.2*  CL 106  CO2 24  GLUCOSE 107*  BUN 19  CREATININE 0.82  CALCIUM 8.7*    GFR: Estimated Creatinine Clearance: 79.5 mL/min (by C-G formula based on SCr of 0.82 mg/dL).  Liver Function Tests:  Recent Labs Lab 09/28/16 0625  AST 26  ALT 20  ALKPHOS 55  BILITOT 1.0  PROT 6.8  ALBUMIN 3.5   No results for input(s): LIPASE, AMYLASE in the last 168 hours. No results for input(s): AMMONIA in the last 168 hours.  Coagulation Profile:  Recent Labs Lab 09/28/16 0625  INR 1.18    Cardiac Enzymes: No results for input(s): CKTOTAL, CKMB, CKMBINDEX, TROPONINI in the last 168 hours.  BNP (last 3 results) No results for input(s): PROBNP in the last 8760 hours.  HbA1C: No results for input(s): HGBA1C in the last 72 hours.  CBG: No results for input(s): GLUCAP in the last 168 hours.  Lipid Profile: No  results for input(s): CHOL, HDL, LDLCALC, TRIG, CHOLHDL, LDLDIRECT in the last 72 hours.  Thyroid Function Tests: No results for input(s): TSH, T4TOTAL, FREET4, T3FREE, THYROIDAB in the last 72 hours.  Anemia Panel: No results for input(s): VITAMINB12, FOLATE, FERRITIN, TIBC, IRON, RETICCTPCT in the last 72 hours.  Urine analysis:    Component Value Date/Time   COLORURINE YELLOW 09/28/2016 0616   APPEARANCEUR CLEAR 09/28/2016 0616   LABSPEC 1.018 09/28/2016 0616   PHURINE 6.5 09/28/2016 0616   GLUCOSEU NEGATIVE 09/28/2016 0616   GLUCOSEU NEGATIVE 07/26/2015 1021   HGBUR NEGATIVE 09/28/2016 0616   BILIRUBINUR NEGATIVE 09/28/2016 0616   KETONESUR 15 (A) 09/28/2016 0616   PROTEINUR NEGATIVE 09/28/2016 0616   UROBILINOGEN 0.2 07/26/2015 1021   NITRITE NEGATIVE 09/28/2016 0616   LEUKOCYTESUR NEGATIVE 09/28/2016 0616    Sepsis Labs: @LABRCNTIP (procalcitonin:4,lacticidven:4) )No results found for this or any previous visit (from the past 240 hour(s)).   Radiological Exams on Admission: Dg Chest 2 View  Result Date: 09/28/2016 CLINICAL DATA:  Initial evaluation for acute shortness of breath, cough, fever. EXAM: CHEST  2 VIEW COMPARISON:  Prior radiograph from 08/27/2016. FINDINGS: Cardiac and mediastinal silhouettes are stable, and remain within normal limits. Post treatment/ postsurgical changes present at the right hemithorax with associated volume loss and scarring, stable. Right lung is grossly clear. There is patchy opacity at the mid left lung base, concerning for possible infiltrate. Left lung is otherwise clear. No pulmonary edema or pleural effusion. No pneumothorax. No acute osseus abnormality. Postsurgical changes noted within the lower lumbar spine. Cervical ACDF noted as well. IMPRESSION: 1. Patchy left basilar opacity, concerning for pneumonia given  the history of cough and fever. 2. Stable post treatment changes within the right lung with associated volume loss. Electronically  Signed   By: Jeannine Boga M.D.   On: 09/28/2016 05:21    EKG: Independently reviewed.  Assessment/Plan Active Problems:   CAP (community acquired pneumonia)   Benign essential HTN   Allergic rhinitis   COPD (chronic obstructive pulmonary disease) (HCC)   Arthritis   Hx of migraines   Community Acquired Pneumonia in a patient with COPD/ emphysema and R lobectomy 1996 due to abscess for same presentation as today's  Presenting now with progressive symptoms including productive cough and dyspnea . Tmax 99  CXR today suggests Patchy left basilar opacity, concerning for pneumonia  Received Zithromax and Rocephin IV, in addition to Tamiflu, Dunoneb and albuterol, IVF Influenza panel pending. Urine, Sputum  and Blood culture ordered at the ED pending  Admit to MedSurg Obs  Oxygen prn  IV antibiotics with per protocol with Rocephin and Azithromycin . Continue Tamiflu today unless negative results on test  Nebulizers as needed, with Duoneb q 4prn and Albuterol q 2 h prn Mucinex prn  Antipyretics prn  Repeat CBC in am CXR in am   COPD/ emphysema   Continue nebs and O2 prn . Monitor Osats closely  Continue home meds .   Hypokalemia, EKG without any changes Current K 3.2 . May be dietary  Oral replenishment Kdur 40 meq x1  Repeat CMET in am   Hypertension BP 151/78 Pulse 96   Controlled Continue home anti-hypertensive medications   History of migraines Continue Excedrin as needed   Osteoarthritis Continue Mobic     DVT prophylaxis: Lovenox  Code Status:   Full  Family Communication:  Discussed with patient Disposition Plan: Expect patient to be discharged to home after condition improves Consults called:    None Admission status  Obs  Medsurg     Halim Surrette E, PA-C [facility]   09/28/2016, 11:15 AM

## 2016-09-29 ENCOUNTER — Inpatient Hospital Stay (HOSPITAL_COMMUNITY): Payer: Medicare HMO

## 2016-09-29 DIAGNOSIS — J181 Lobar pneumonia, unspecified organism: Secondary | ICD-10-CM

## 2016-09-29 DIAGNOSIS — E876 Hypokalemia: Secondary | ICD-10-CM

## 2016-09-29 DIAGNOSIS — J438 Other emphysema: Secondary | ICD-10-CM

## 2016-09-29 LAB — COMPREHENSIVE METABOLIC PANEL
ALT: 19 U/L (ref 17–63)
AST: 25 U/L (ref 15–41)
Albumin: 2.8 g/dL — ABNORMAL LOW (ref 3.5–5.0)
Alkaline Phosphatase: 55 U/L (ref 38–126)
Anion gap: 6 (ref 5–15)
BUN: 12 mg/dL (ref 6–20)
CO2: 24 mmol/L (ref 22–32)
Calcium: 8.6 mg/dL — ABNORMAL LOW (ref 8.9–10.3)
Chloride: 108 mmol/L (ref 101–111)
Creatinine, Ser: 0.78 mg/dL (ref 0.61–1.24)
GFR calc Af Amer: >60 mL/min (ref >60)
GFR calc non Af Amer: >60 mL/min (ref >60)
Glucose, Bld: 128 mg/dL — ABNORMAL HIGH (ref 65–99)
Potassium: 3.4 mmol/L — ABNORMAL LOW (ref 3.5–5.1)
Sodium: 138 mmol/L (ref 135–145)
Total Bilirubin: 0.6 mg/dL (ref 0.3–1.2)
Total Protein: 5.8 g/dL — ABNORMAL LOW (ref 6.5–8.1)

## 2016-09-29 LAB — RESPIRATORY PANEL BY PCR

## 2016-09-29 LAB — CBC
HCT: 37.5 % — ABNORMAL LOW (ref 39.0–52.0)
Hemoglobin: 12.3 g/dL — ABNORMAL LOW (ref 13.0–17.0)
MCH: 30.8 pg (ref 26.0–34.0)
MCHC: 32.8 g/dL (ref 30.0–36.0)
MCV: 94 fL (ref 78.0–100.0)
Platelets: 248 10*3/uL (ref 150–400)
RBC: 3.99 MIL/uL — ABNORMAL LOW (ref 4.22–5.81)
RDW: 13.4 % (ref 11.5–15.5)
WBC: 7.6 10*3/uL (ref 4.0–10.5)

## 2016-09-29 LAB — EXPECTORATED SPUTUM ASSESSMENT W REFEX TO RESP CULTURE

## 2016-09-29 LAB — URINE CULTURE: Culture: NO GROWTH

## 2016-09-29 LAB — GLUCOSE, CAPILLARY: Glucose-Capillary: 198 mg/dL — ABNORMAL HIGH (ref 65–99)

## 2016-09-29 LAB — STREP PNEUMONIAE URINARY ANTIGEN: Strep Pneumo Urinary Antigen: NEGATIVE

## 2016-09-29 LAB — EXPECTORATED SPUTUM ASSESSMENT W GRAM STAIN, RFLX TO RESP C

## 2016-09-29 MED ORDER — BENZONATATE 100 MG PO CAPS
100.0000 mg | ORAL_CAPSULE | Freq: Three times a day (TID) | ORAL | Status: DC | PRN
  Administered 2016-09-30: 100 mg via ORAL
  Filled 2016-09-29: qty 1

## 2016-09-29 MED ORDER — IPRATROPIUM-ALBUTEROL 0.5-2.5 (3) MG/3ML IN SOLN
3.0000 mL | Freq: Four times a day (QID) | RESPIRATORY_TRACT | Status: AC
  Administered 2016-09-29 (×2): 3 mL via RESPIRATORY_TRACT
  Filled 2016-09-29 (×3): qty 3

## 2016-09-29 MED ORDER — METHYLPREDNISOLONE SODIUM SUCC 125 MG IJ SOLR
60.0000 mg | INTRAMUSCULAR | Status: DC
  Administered 2016-09-29: 60 mg via INTRAVENOUS
  Filled 2016-09-29 (×2): qty 2

## 2016-09-29 NOTE — Progress Notes (Signed)
PROGRESS NOTE  [name] [id number] DOB: 11-29-1945 DOA: 09/28/2016 PCP: Penni Homans, MD  HPI/Recap of past 24 hours: Feeling better, but not able to cough up  Assessment/Plan: Active Problems:   Benign essential HTN   Allergic rhinitis   COPD (chronic obstructive pulmonary disease) ([place])   Arthritis   CAP (community acquired pneumonia)   Hx of migraines  Community Acquired Pneumonia in a patient with COPD/ emphysema and R lobectomy 1996 due to abscess for same presentation as today's  Presenting now with progressive symptoms including productive cough and dyspnea . Tmax 99  CXR today suggests Patchy left basilar opacity, concerning for pneumonia  Influenza panel negative, respiratory viral panel negative, d/c tamiflu that was started empirically  Blood culture no growth, ua no infection, sputum culture pending repeat collection ( first one collected, not suitable for testing)   continue Zithromax and Rocephin IV,  Dunoneb and albuterol, IVF   Will need to repeat cxr in 3-4 weeks to ensure resolution of left basilar opacity  COPD/ emphysema   Continue nebs and O2 prn . Monitor Osats closely  Continue home meds .  He reported he was tapered off steroids recently, will start on solumedrol  Hypokalemia, EKG without any changes  S/p replacement, monitor, replace prn   Hypertension BP 151/78 Pulse 96   Controlled Continue home anti-hypertensive medications   History of migraines Continue Excedrin as needed   Osteoarthritis Continue Mobic     DVT prophylaxis: Lovenox  Code Status:   Full  Family Communication:  Discussed with patient Disposition Plan: Expect patient to be discharged to home after condition improves, likely 1/29, I donot anticipate any home health need, patient at baseline independent and very active Consults called:    None   Procedures:  none  Antibiotics:  Rocephin/zithro   Objective: BP (!) 148/87 (BP Location: Left  Arm)   Pulse 93   Temp 98.5 F (36.9 C) (Oral)   Resp 16   Ht 5\' 7"  (1.702 m)   Wt 67.1 kg (148 lb)   SpO2 98%   BMI 23.18 kg/m   Intake/Output Summary (Last 24 hours) at 09/29/16 1459 Last data filed at 09/29/16 0917  Gross per 24 hour  Intake             2520 ml  Output                0 ml  Net             2520 ml   Filed Weights   09/28/16 0348  Weight: 67.1 kg (148 lb)    Exam:   General:  NAD  Cardiovascular: RRR  Respiratory: diminished right lower lobe (chronic), no significant wheezing, no rales, no rhonchi  Abdomen: Soft/ND/NT, positive BS  Musculoskeletal: No Edema  Neuro: aaox3  Data Reviewed: Basic Metabolic Panel:  Recent Labs Lab 09/28/16 0625 09/29/16 0453  NA 136 138  K 3.2* 3.4*  CL 106 108  CO2 24 24  GLUCOSE 107* 128*  BUN 19 12  CREATININE 0.82 0.78  CALCIUM 8.7* 8.6*   Liver Function Tests:  Recent Labs Lab 09/28/16 0625 09/29/16 0453  AST 26 25  ALT 20 19  ALKPHOS 55 55  BILITOT 1.0 0.6  PROT 6.8 5.8*  ALBUMIN 3.5 2.8*   No results for input(s): LIPASE, AMYLASE in the last 168 hours. No results for input(s): AMMONIA in the last 168 hours. CBC:  Recent Labs Lab 09/28/16 0625 09/29/16 0453  WBC 9.3 7.6  NEUTROABS 6.9  --   HGB 13.3 12.3*  HCT 39.3 37.5*  MCV 93.8 94.0  PLT 229 248   Cardiac Enzymes:   No results for input(s): CKTOTAL, CKMB, CKMBINDEX, TROPONINI in the last 168 hours. BNP (last 3 results) No results for input(s): BNP in the last 8760 hours.  ProBNP (last 3 results) No results for input(s): PROBNP in the last 8760 hours.  CBG: No results for input(s): GLUCAP in the last 168 hours.  Recent Results (from the past 240 hour(s))  Urine culture     Status: None   Collection Time: 09/28/16  6:16 AM  Result Value Ref Range Status   Specimen Description URINE, RANDOM  Final   Special Requests NONE  Final   Culture   Final    NO GROWTH Performed at [facility], [address]  [place], [place] [address]    Report Status 09/29/2016 FINAL  Final  Blood Culture (routine x 2)     Status: None (Preliminary result)   Collection Time: 09/28/16  6:25 AM  Result Value Ref Range Status   Specimen Description BLOOD RIGHT ARM  Final   Special Requests BOTTLES DRAWN AEROBIC AND ANAEROBIC 5ML EACH  Final   Culture   Final    NO GROWTH 1 DAY Performed at [facility], [place] [address], [place], [place] [address]    Report Status PENDING  Incomplete  Blood Culture (routine x 2)     Status: None (Preliminary result)   Collection Time: 09/28/16  6:30 AM  Result Value Ref Range Status   Specimen Description BLOOD LEFT ARM  Final   Special Requests BOTTLES DRAWN AEROBIC AND ANAEROBIC 5ML EACH  Final   Culture   Final    NO GROWTH 1 DAY Performed at [facility], [address], [place], [place] [address]    Report Status PENDING  Incomplete  Culture, sputum-assessment     Status: None   Collection Time: 09/29/16  6:14 AM  Result Value Ref Range Status   Specimen Description SPUTUM  Final   Special Requests NONE  Final   Sputum evaluation   Final    Sputum specimen not acceptable for testing.  Please recollect.   Gram Stain Report Called to,Read Back By and Verified With: RN Adah Salvage [phone] MLM    Report Status 09/29/2016 FINAL  Final  Respiratory Panel by PCR     Status: None   Collection Time: 09/29/16 11:00 AM  Result Value Ref Range Status   Adenovirus NOT DETECTED NOT DETECTED Final   Coronavirus 229E NOT DETECTED NOT DETECTED Final   Coronavirus HKU1 NOT DETECTED NOT DETECTED Final   Coronavirus NL63 NOT DETECTED NOT DETECTED Final   Coronavirus OC43 NOT DETECTED NOT DETECTED Final   Metapneumovirus NOT DETECTED NOT DETECTED Final   Rhinovirus / Enterovirus NOT DETECTED NOT DETECTED Final   Influenza A NOT DETECTED NOT DETECTED Final   Influenza B NOT DETECTED NOT DETECTED Final   Parainfluenza Virus 1 NOT DETECTED NOT DETECTED Final    Parainfluenza Virus 2 NOT DETECTED NOT DETECTED Final   Parainfluenza Virus 3 NOT DETECTED NOT DETECTED Final   Parainfluenza Virus 4 NOT DETECTED NOT DETECTED Final   Respiratory Syncytial Virus NOT DETECTED NOT DETECTED Final   Bordetella pertussis NOT DETECTED NOT DETECTED Final   Chlamydophila pneumoniae NOT DETECTED NOT DETECTED Final   Mycoplasma pneumoniae NOT DETECTED NOT DETECTED Final  Studies: Dg Chest 2 View  Result Date: 09/29/2016 CLINICAL DATA:  Shortness of breath, community acquired pneumonia EXAM: CHEST  2 VIEW COMPARISON:  09/28/2016 FINDINGS: Patchy opacity in the lingula/left base again noted, stable. Postoperative changes on the right with chronic scarring and volume loss. Heart is normal size. No effusions. IMPRESSION: No change in the lingular/left basilar opacity since prior study. Electronically Signed   By: Rolm Baptise M.D.   On: 09/29/2016 08:34    Scheduled Meds: . amLODipine  5 mg Oral Daily  . azithromycin  500 mg Intravenous Q24H  . cefTRIAXone (ROCEPHIN)  IV  1 g Intravenous Q24H  . enoxaparin (LOVENOX) injection  40 mg Subcutaneous Q24H  . guaiFENesin  600 mg Oral BID  . ipratropium-albuterol  3 mL Nebulization Q6H  . methylPREDNISolone (SOLU-MEDROL) injection  60 mg Intravenous Q24H  . mometasone-formoterol  2 puff Inhalation BID  . montelukast  10 mg Oral Daily  . tiotropium  1 capsule Inhalation Daily    Continuous Infusions: . sodium chloride 1,000 mL (09/28/16 1124)     Time spent: 65mins  Barbra Miner MD, PhD  [facility] Pager [phone]. If 7PM-7AM, please contact night-coverage at [email or web address], password [facility] 09/29/2016, 2:59 PM  LOS: 1 day

## 2016-09-30 ENCOUNTER — Encounter (HOSPITAL_COMMUNITY): Payer: Self-pay

## 2016-09-30 DIAGNOSIS — J441 Chronic obstructive pulmonary disease with (acute) exacerbation: Secondary | ICD-10-CM

## 2016-09-30 LAB — CBC
HCT: 37.9 % — ABNORMAL LOW (ref 39.0–52.0)
Hemoglobin: 12.6 g/dL — ABNORMAL LOW (ref 13.0–17.0)
MCH: 31 pg (ref 26.0–34.0)
MCHC: 33.2 g/dL (ref 30.0–36.0)
MCV: 93.1 fL (ref 78.0–100.0)
Platelets: 280 10*3/uL (ref 150–400)
RBC: 4.07 MIL/uL — ABNORMAL LOW (ref 4.22–5.81)
RDW: 13.1 % (ref 11.5–15.5)
WBC: 9 10*3/uL (ref 4.0–10.5)

## 2016-09-30 LAB — LEGIONELLA PNEUMOPHILA SEROGP 1 UR AG: L. pneumophila Serogp 1 Ur Ag: NEGATIVE

## 2016-09-30 LAB — MAGNESIUM: Magnesium: 2 mg/dL (ref 1.7–2.4)

## 2016-09-30 LAB — BASIC METABOLIC PANEL
Anion gap: 6 (ref 5–15)
BUN: 12 mg/dL (ref 6–20)
CO2: 24 mmol/L (ref 22–32)
Calcium: 9.1 mg/dL (ref 8.9–10.3)
Chloride: 110 mmol/L (ref 101–111)
Creatinine, Ser: 0.72 mg/dL (ref 0.61–1.24)
GFR calc Af Amer: >60 mL/min (ref >60)
GFR calc non Af Amer: >60 mL/min (ref >60)
Glucose, Bld: 127 mg/dL — ABNORMAL HIGH (ref 65–99)
Potassium: 4.1 mmol/L (ref 3.5–5.1)
Sodium: 140 mmol/L (ref 135–145)

## 2016-09-30 MED ORDER — PREDNISONE 10 MG PO TABS: ORAL_TABLET | ORAL | 0 refills | Status: DC

## 2016-09-30 MED ORDER — DOXYCYCLINE HYCLATE 100 MG PO CAPS: 100.0000 mg | ORAL_CAPSULE | Freq: Two times a day (BID) | ORAL | 0 refills | Status: AC

## 2016-09-30 MED ORDER — BENZONATATE 100 MG PO CAPS: 100.0000 mg | ORAL_CAPSULE | Freq: Three times a day (TID) | ORAL | 0 refills | Status: DC | PRN

## 2016-09-30 MED ORDER — DOXYCYCLINE HYCLATE 100 MG PO CAPS: 100.0000 mg | ORAL_CAPSULE | Freq: Two times a day (BID) | ORAL | 0 refills | Status: DC

## 2016-09-30 NOTE — Progress Notes (Signed)
[name] to be D/C'd to home per MD order.  Discussed with the patient and all questions fully answered.  VSS, Skin clean, dry and intact without evidence of skin break down, no evidence of skin tears noted. IV catheter discontinued intact. Site without signs and symptoms of complications. Dressing and pressure applied.  An After Visit Summary was printed and given to the patient. Patient received prescriptions.  D/c education completed with patient/family including follow up instructions, medication list, d/c activities limitations if indicated, with other d/c instructions as indicated by MD - patient able to verbalize understanding, all questions fully answered.   Patient instructed to return to ED, call 911, or call MD for any changes in condition.   Patient escorted via [place], and D/C home via private auto.  Morley Kos Price 09/30/2016 10:57 AM

## 2016-09-30 NOTE — Discharge Summary (Signed)
Discharge Summary  [name] [id number] DOB: Dec 18, 1945  PCP: Penni Homans, MD  Admit date: 09/28/2016 Discharge date: 09/30/2016  Time spent: <69mins  Recommendations for Outpatient Follow-up:  1. F/u with PMD within a week  for hospital discharge follow up, repeat cbc/bmp at follow up 2. F/u with pulmonology in two weeks  Discharge Diagnoses:  Active Hospital Problems   Diagnosis Date Noted  . CAP (community acquired pneumonia) 09/28/2016  . Hx of migraines 09/28/2016  . Arthritis 10/18/2013  . Benign essential HTN 05/23/2010  . Allergic rhinitis 07/07/2007  . COPD (chronic obstructive pulmonary disease) ([place]) 07/07/2007    Resolved Hospital Problems   Diagnosis Date Noted Date Resolved  No resolved problems to display.    Discharge Condition: stable  Diet recommendation: heart healthy  Filed Weights   09/28/16 0348  Weight: 67.1 kg (148 lb)    History of present illness:  PCP: Penni Homans, MD   Patient coming from:  Home   Chief Complaint: Shortness of breath and cough   HPI: [name] is a 71 y.o. male with medical history significant for COPD/ emphysema, R lung lobectomy due to lung abscess, brought from [facility] for treatment of CAP. In review, patient had been experiencing increased productive cough since last Thursday. This is following a recent COPD exacerbation as outpatient  for which he had taken prednisone and inhalers, without resolution of symtpoms. This morning he noted increased dyspnea at rest, myalgia, chills and T max up to 99. He noted some redness in his sputum. He is not aware of sick contacts.  Denies any chest pain, chest wall pain or palpitations.Denies any long distance travels. Denies any abdominal pain. Appetite is somewhat decreased over the last day due to symptoms without  nausea or vomiting. Denies dizziness or vertigo. Denies lower extremity swelling. No confusion was reported. Denies any vision changes, double vision or  headaches.    ED Course:  BP (!) 133/91 (BP Location: Left Arm)   Pulse 95   Temp 99.5 F (37.5 C) (Oral)   Resp 20   Ht 5\' 7"  (1.702 m)   Wt 67.1 kg (148 lb)   SpO2 94%   BMI 23.18 kg/m   Patchy left basilar opacity, concerning for pneumonia  Received Zithromax and Rocephin IV, in addition to Tamiflu, Dunoneb and albuterol, IVF  Influenza panel pending Urine and Blood culture pending  Lactic acid 0.8 WBC 9.3  EKG S tach without ACS   Hospital Course:  Active Problems:   Benign essential HTN   Allergic rhinitis   COPD (chronic obstructive pulmonary disease) ([facility])   Arthritis   CAP (community acquired pneumonia)   Hx of migraines   Community Acquired Pneumonia in a patient with COPD/ emphysema and R lobectomy 1996 due to abscess for same presentation as today's  Presented with progressive symptoms including productive cough, dyspnea on exacerbation, initially Pt is noted to have to stop halfway through sentences on occasion to take a deep breath. Tmax 99 CXR suggests Patchy left basilar opacity, concerning for pneumonia.   Influenza panel negative, respiratory viral panel negative, d/c tamiflu that was started empirically  Blood culture no growth, ua no infection, sputum culture pending repeat collection ( first one collected, not suitable for testing)   continue Zithromax and Rocephin IV,  Dunoneb and albuterol, IVF , iv steroids ( patient report he recently just finished a course of steroids, last dose on 1/15, he feels the steroid course was not long enough to  control his symptom), he is also started on iv solumedrol.  With above measures, his symptom has greatly improved, at time of discharge his lung exam has improved with improved aeration and rales initially heard at left lower base has resolved. He is discharged on oral abx/prednisone taper, follow up with pmd and pulmonology  Will need to repeat cxr in 3-4 weeks to ensure resolution of left basilar  opacity  COPD/ emphysema exacerbation Continue nebs and O2 prn . Monitor Osats closely  Continue home meds .  He reported he was tapered off steroids recently, he is stared on iv solumedrol, iv abx, symptom has improved , he is discharged on oral abx/prednisone taper  Hypokalemia, EKG without any changes S/p replacement, Mag 2.   Hypertension BP 151/78 Pulse 96  Controlled Continue home anti-hypertensive medications   History of migraines Continue Excedrin as needed   Osteoarthritis Continue Mobic     DVT prophylaxis:Lovenox  Code Status:Full  Family Communication:Discussed with patient Disposition Plan:discharge to home on 1/29, I donot anticipate any home health need, patient at baseline independent and very active Consults called:None   Procedures:  none  Antibiotics:  Rocephin/zithro   Discharge Exam: BP (!) 156/90 (BP Location: Right Arm)   Pulse 91   Temp 97.9 F (36.6 C) (Oral)   Resp 17   Ht 5\' 7"  (1.702 m)   Wt 67.1 kg (148 lb)   SpO2 96%   BMI 23.18 kg/m     General:  NAD  Cardiovascular: RRR  Respiratory: diminished right lower lobe (chronic), no significant wheezing, no rales ( rales at left lower basis initially heard on admission has resolved, no rhonchi. Improved aeration both lung at discharge  Abdomen: Soft/ND/NT, positive BS  Musculoskeletal: No Edema  Neuro: aaox3   Discharge Instructions You were cared for by a hospitalist during your hospital stay. If you have any questions about your discharge medications or the care you received while you were in the hospital after you are discharged, you can call the unit and asked to speak with the hospitalist on call if the hospitalist that took care of you is not available. Once you are discharged, your primary care physician will handle any further medical issues. Please note that NO REFILLS for any discharge medications will be authorized once you are  discharged, as it is imperative that you return to your primary care physician (or establish a relationship with a primary care physician if you do not have one) for your aftercare needs so that they can reassess your need for medications and monitor your lab values.  Discharge Instructions    Diet general    Complete by:  As directed    Increase activity slowly    Complete by:  As directed      Allergies as of 09/30/2016   No Known Allergies     Medication List    TAKE these medications   albuterol 108 (90 Base) MCG/ACT inhaler Commonly known as:  PROVENTIL HFA;VENTOLIN HFA Inhale 2 puffs into the lungs every 6 (six) hours as needed for wheezing or shortness of breath.   amLODipine 5 MG tablet Commonly known as:  NORVASC Take 1 tablet (5 mg total) by mouth daily. What changed:  when to take this   aspirin-acetaminophen-caffeine 250-250-65 MG tablet Commonly known as:  EXCEDRIN MIGRAINE Take 2 tablets by mouth every 6 (six) hours as needed (arthritis pain).   benzonatate 100 MG capsule Commonly known as:  TESSALON Take 1 capsule (100 mg  total) by mouth 3 (three) times daily as needed for cough.   BLUE-EMU MAXIMUM STRENGTH EX Apply 1 application topically daily as needed (arthritis pain).   budesonide-formoterol 160-4.5 MCG/ACT inhaler Commonly known as:  SYMBICORT Inhale 2 puffs into the lungs 2 (two) times daily.   doxycycline 100 MG capsule Commonly known as:  VIBRAMYCIN Take 1 capsule (100 mg total) by mouth 2 (two) times daily.   fluticasone 50 MCG/ACT nasal spray Commonly known as:  FLONASE USE 2 SPRAYS IN EACH NOSTRIL ONE TIME DAILY What changed:  how much to take  how to take this  when to take this  additional instructions   Glucosamine-Chondroitin 750-600 MG Tabs Take 1 tablet by mouth 2 (two) times daily.   loratadine 10 MG tablet Commonly known as:  CLARITIN Take 10 mg by mouth daily.   meloxicam 15 MG tablet Commonly known as:  MOBIC TAKE 1  TABLET (15 MG TOTAL) BY MOUTH DAILY AS NEEDED FOR PAIN (WITH FOOD). What changed:  how much to take  how to take this  when to take this  additional instructions   montelukast 10 MG tablet Commonly known as:  SINGULAIR Take 1 tablet (10 mg total) by mouth daily. What changed:  when to take this   [place] 1200 MG Tb12 Generic drug:  Guaifenesin Take 600 mg by mouth 2 (two) times daily.   multivitamin with minerals Tabs tablet Take 1 tablet by mouth See admin instructions. Take 1 tablet by mouth on Monday, Wednesday, Friday with supper   predniSONE 10 MG tablet Commonly known as:  DELTASONE Label  & dispense according to the schedule below. 4Pills PO for 3 days then, 3 Pills PO for 3 days, 2 Pills PO for 2 days, 1 Pills PO for 1 days, 1/2 Pill  PO for 1 day then STOP.   ROBITUSSIN COUGH DROPS MT Use as directed 1 lozenge in the mouth or throat 4 (four) times daily as needed (cough).   SALONPAS GEL EX Apply 1 application topically daily as needed (arthritis pain).   Tiotropium Bromide Monohydrate 2.5 MCG/ACT Aers Commonly known as:  SPIRIVA RESPIMAT Inhale 2 puffs into the lungs daily. What changed:  how much to take   TRIPLE ANTIBIOTIC PAIN RELIEF EX Apply 1 application topically daily as needed (wound care).   TUMS ULTRA 1000 400 MG chewable tablet Generic drug:  calcium elemental as carbonate Chew 1,000 mg by mouth 2 (two) times daily.      No Known Allergies Follow-up Information    Rigoberto Noel., MD Follow up in 2 week(s).   Specialty:  Pulmonary Disease Why:  copd exacerbation Contact information: [address] [address] [id number]        Penni Homans, MD Follow up in 1 week(s).   Specialty:  Family Medicine Why:  hospital discharge follow up Contact information: [address] [place] [address] [phone]        Jenkins Rouge, MD .   Specialty:  Cardiology Contact information: [id number] [place] [address] [phone]            The results of significant diagnostics from this hospitalization (including imaging, microbiology, ancillary and laboratory) are listed below for reference.    Significant Diagnostic Studies: Dg Chest 2 View  Result Date: 09/29/2016 CLINICAL DATA:  Shortness of breath, community acquired pneumonia EXAM: CHEST  2 VIEW COMPARISON:  09/28/2016 FINDINGS: Patchy opacity in the lingula/left  base again noted, stable. Postoperative changes on the right with chronic scarring and volume loss. Heart is normal size. No effusions. IMPRESSION: No change in the lingular/left basilar opacity since prior study. Electronically Signed   By: Rolm Baptise M.D.   On: 09/29/2016 08:34   Dg Chest 2 View  Result Date: 09/28/2016 CLINICAL DATA:  Initial evaluation for acute shortness of breath, cough, fever. EXAM: CHEST  2 VIEW COMPARISON:  Prior radiograph from 08/27/2016. FINDINGS: Cardiac and mediastinal silhouettes are stable, and remain within normal limits. Post treatment/ postsurgical changes present at the right hemithorax with associated volume loss and scarring, stable. Right lung is grossly clear. There is patchy opacity at the mid left lung base, concerning for possible infiltrate. Left lung is otherwise clear. No pulmonary edema or pleural effusion. No pneumothorax. No acute osseus abnormality. Postsurgical changes noted within the lower lumbar spine. Cervical ACDF noted as well. IMPRESSION: 1. Patchy left basilar opacity, concerning for pneumonia given the history of cough and fever. 2. Stable post treatment changes within the right lung with associated volume loss. Electronically Signed   By: Jeannine Boga M.D.   On: 09/28/2016 05:21    Microbiology: Recent Results (from the past 240 hour(s))  Urine culture     Status: None   Collection Time: 09/28/16  6:16 AM  Result Value Ref Range Status   Specimen Description URINE,  RANDOM  Final   Special Requests NONE  Final   Culture   Final    NO GROWTH Performed at [facility], [address], [place], [place] [address]    Report Status 09/29/2016 FINAL  Final  Blood Culture (routine x 2)     Status: None (Preliminary result)   Collection Time: 09/28/16  6:25 AM  Result Value Ref Range Status   Specimen Description BLOOD RIGHT ARM  Final   Special Requests BOTTLES DRAWN AEROBIC AND ANAEROBIC 5ML EACH  Final   Culture   Final    NO GROWTH 1 DAY Performed at [facility], [place] [address] [place], [place] [address]    Report Status PENDING  Incomplete  Blood Culture (routine x 2)     Status: None (Preliminary result)   Collection Time: 09/28/16  6:30 AM  Result Value Ref Range Status   Specimen Description BLOOD LEFT ARM  Final   Special Requests BOTTLES DRAWN AEROBIC AND ANAEROBIC 5ML EACH  Final   Culture   Final    NO GROWTH 1 DAY Performed at [facility], [place] [address] [place], [place] [address]    Report Status PENDING  Incomplete  Culture, sputum-assessment     Status: None   Collection Time: 09/29/16  6:14 AM  Result Value Ref Range Status   Specimen Description SPUTUM  Final   Special Requests NONE  Final   Sputum evaluation   Final    Sputum specimen not acceptable for testing.  Please recollect.   Gram Stain Report Called to,Read Back By and Verified With: RN Adah Salvage [phone] MLM    Report Status 09/29/2016 FINAL  Final  Respiratory Panel by PCR     Status: None   Collection Time: 09/29/16 11:00 AM  Result Value Ref Range Status   Adenovirus NOT DETECTED NOT DETECTED Final   Coronavirus 229E NOT DETECTED NOT DETECTED Final   Coronavirus HKU1 NOT DETECTED NOT DETECTED Final   Coronavirus NL63 NOT DETECTED NOT DETECTED Final   Coronavirus OC43 NOT DETECTED NOT DETECTED  Final   Metapneumovirus NOT DETECTED NOT DETECTED Final   Rhinovirus / Enterovirus NOT DETECTED NOT DETECTED Final   Influenza A NOT DETECTED NOT  DETECTED Final   Influenza B NOT DETECTED NOT DETECTED Final   Parainfluenza Virus 1 NOT DETECTED NOT DETECTED Final   Parainfluenza Virus 2 NOT DETECTED NOT DETECTED Final   Parainfluenza Virus 3 NOT DETECTED NOT DETECTED Final   Parainfluenza Virus 4 NOT DETECTED NOT DETECTED Final   Respiratory Syncytial Virus NOT DETECTED NOT DETECTED Final   Bordetella pertussis NOT DETECTED NOT DETECTED Final   Chlamydophila pneumoniae NOT DETECTED NOT DETECTED Final   Mycoplasma pneumoniae NOT DETECTED NOT DETECTED Final     Labs: Basic Metabolic Panel:  Recent Labs Lab 09/28/16 0625 09/29/16 0453 09/30/16 0535  NA 136 138 140  K 3.2* 3.4* 4.1  CL 106 108 110  CO2 24 24 24   GLUCOSE 107* 128* 127*  BUN 19 12 12   CREATININE 0.82 0.78 0.72  CALCIUM 8.7* 8.6* 9.1  MG  --   --  2.0   Liver Function Tests:  Recent Labs Lab 09/28/16 0625 09/29/16 0453  AST 26 25  ALT 20 19  ALKPHOS 55 55  BILITOT 1.0 0.6  PROT 6.8 5.8*  ALBUMIN 3.5 2.8*   No results for input(s): LIPASE, AMYLASE in the last 168 hours. No results for input(s): AMMONIA in the last 168 hours. CBC:  Recent Labs Lab 09/28/16 0625 09/29/16 0453 09/30/16 0535  WBC 9.3 7.6 9.0  NEUTROABS 6.9  --   --   HGB 13.3 12.3* 12.6*  HCT 39.3 37.5* 37.9*  MCV 93.8 94.0 93.1  PLT 229 248 280   Cardiac Enzymes: No results for input(s): CKTOTAL, CKMB, CKMBINDEX, TROPONINI in the last 168 hours. BNP: BNP (last 3 results) No results for input(s): BNP in the last 8760 hours.  ProBNP (last 3 results) No results for input(s): PROBNP in the last 8760 hours.  CBG:  Recent Labs Lab 09/29/16 2147  GLUCAP 198*       SignedFlorencia Reasons MD, PhD  [facility] 09/30/2016, 10:19 AM

## 2016-10-01 ENCOUNTER — Telehealth: Payer: Self-pay | Admitting: Family Medicine

## 2016-10-01 NOTE — Telephone Encounter (Signed)
Pt says that he is returning the nurse call. Pt isn't sure what call was in regards to but states that the person that called name was Santiago Glad. Not showing any notes. Please assist further.

## 2016-10-01 NOTE — Telephone Encounter (Signed)
Attempted to call , but had not contacted this patient.

## 2016-10-02 ENCOUNTER — Telehealth: Payer: Self-pay

## 2016-10-02 NOTE — Telephone Encounter (Signed)
TCM completed with patient. Hospital follow up scheduled.

## 2016-10-02 NOTE — Telephone Encounter (Signed)
10/02/16  Transition Care Management Follow-up Telephone Call  ADMISSION DATE: 09/28/16  DISCHARGE DATE: 1/29-18   How have you been since you were released from the hospital? Patient states he is feeling much better since hospital discharg.   Do you understand why you were in the hospital? YES   Do you understand the discharge instrcutions? Yes      Medications reviewed:  Reviewed medications with patient  Allergies reviewed: NKDA  Dietary changes reviewed:Heart Healthy  Referrals reviewed: Pulmonary and Heartcare appointments made. Patient has appointment with [facility] scheduled.   Functional Questionnaire:   Activities of Daily Living (ADLs):  No help needed at this time   Any transportation issues/concerns?: No   Any patient concerns? Missed 2 weeks of therapy while in hospital.   Confirmed importance and date/time of follow-up visits scheduled: Yes [id number]}  Confirmed with patient if condition begins to worsen call PCP or go to the ER. Yes   Patient was given the [place] line [phone]: Yes

## 2016-10-03 LAB — CULTURE, BLOOD (ROUTINE X 2)
Culture: NO GROWTH
Culture: NO GROWTH

## 2016-10-07 ENCOUNTER — Ambulatory Visit (INDEPENDENT_AMBULATORY_CARE_PROVIDER_SITE_OTHER): Payer: Medicare HMO | Admitting: Adult Health

## 2016-10-07 ENCOUNTER — Encounter: Payer: Self-pay | Admitting: Adult Health

## 2016-10-07 ENCOUNTER — Ambulatory Visit (INDEPENDENT_AMBULATORY_CARE_PROVIDER_SITE_OTHER)
Admission: RE | Admit: 2016-10-07 | Discharge: 2016-10-07 | Disposition: A | Discharge status: Home / Self Care | Payer: Medicare HMO | Source: Ambulatory Visit | Attending: Adult Health | Admitting: Adult Health

## 2016-10-07 VITALS — BP 142/78 | HR 80

## 2016-10-07 DIAGNOSIS — R05 Cough: Secondary | ICD-10-CM | POA: Diagnosis not present

## 2016-10-07 DIAGNOSIS — J441 Chronic obstructive pulmonary disease with (acute) exacerbation: Secondary | ICD-10-CM

## 2016-10-07 DIAGNOSIS — J181 Lobar pneumonia, unspecified organism: Secondary | ICD-10-CM

## 2016-10-07 DIAGNOSIS — J189 Pneumonia, unspecified organism: Secondary | ICD-10-CM

## 2016-10-07 DIAGNOSIS — R0602 Shortness of breath: Secondary | ICD-10-CM | POA: Diagnosis not present

## 2016-10-07 NOTE — Progress Notes (Signed)
Cardiology Office Note    Date:  10/08/2016   ID:  [name], DOB 01-20-1946, MRN [id number]  PCP:  Penni Homans, MD  Cardiologist:  Dr. Johnsie Cancel   CC: post hospital follow up  History of Present Illness:  [name] is a 71 y.o. male with a history of COPD, lung abscess in 1996 s/p RML resection, HTN and PVCs who presents to clinic for post hospital follow up.   He has a history of atypical chest pain in the setting of a URI dating back to 2014. 2D ECHO in 2014 and 2015 showed normal LV function and no valvular abnormalities. PVCs are felt to be related to lung disease. He was last seen by Dr. Johnsie Cancel in 01/2016 and doing well.   I saw him in clinic on 07/09/16 and he was doing quite well.   He was admitted 1/28-1/29/18 for COPD exacerbation and CAP. Treated with IV antibiotics, steroids, nebulized broncho-dilators. Influenza panel negative. CXR 10/07/16 was clear.  Today he presents to clinic for follow up. He is breathing much better. He has chronic cough but no more brown/red sputum. No CP or SOB. No LE edema, orthopnea or PND. No dizziness or syncope. No blood in stool or urine. Has chronic palpitations.    Past Medical History:  Diagnosis Date  . Allergic rhinitis   . Atypical chest pain 12/01/2014  . Back pain 01/12/2013  . COPD (chronic obstructive pulmonary disease) (HCC)    FeV1 64%-2007  . Emphysema   . GERD (gastroesophageal reflux disease)   . History of lung abscess    bronchiectasis with RMLandRLL ersection -1996- Dr Arlyce Dice  . Hordeolum externum (stye) 04/23/2015   Right eye  . Hypertension   . Impaired vision    glasses  . Osteoarthritis    hands and knees  . Sinusitis, acute 04/23/2015    Past Surgical History:  Procedure Laterality Date  . HERNIA REPAIR  10/2009  . left knee    . lower back surgery  09/2008   06/2009  . LUNG SURGERY     RML andRUL removed due to bleeding and bronchiectasis and lung abcess  . NECK SURGERY  05/2008  . right foot  surgery      Current Medications: Outpatient Medications Prior to Visit  Medication Sig Dispense Refill  . albuterol (PROVENTIL HFA;VENTOLIN HFA) 108 (90 BASE) MCG/ACT inhaler Inhale 2 puffs into the lungs every 6 (six) hours as needed for wheezing or shortness of breath. 3 Inhaler 3  . aspirin-acetaminophen-caffeine (EXCEDRIN MIGRAINE) [id number] MG per tablet Take 2 tablets by mouth every 6 (six) hours as needed (arthritis pain).     . benzonatate (TESSALON) 100 MG capsule Take 1 capsule (100 mg total) by mouth 3 (three) times daily as needed for cough. 20 capsule 0  . budesonide-formoterol (SYMBICORT) 160-4.5 MCG/ACT inhaler Inhale 2 puffs into the lungs 2 (two) times daily. 3 Inhaler 0  . calcium elemental as carbonate (TUMS ULTRA 1000) 400 MG chewable tablet Chew 1,000 mg by mouth 2 (two) times daily.    . Capsaicin-Menthol (SALONPAS GEL EX) Apply 1 application topically daily as needed (arthritis pain).    . Glucosamine-Chondroitin 750-600 MG TABS Take 1 tablet by mouth 2 (two) times daily.    . Guaifenesin (MUCINEX MAXIMUM STRENGTH) 1200 MG TB12 Take 600 mg by mouth 2 (two) times daily.    Marland Kitchen loratadine (CLARITIN) 10 MG tablet Take 10 mg by mouth daily.    . Menthol (ROBITUSSIN COUGH  DROPS MT) Use as directed 1 lozenge in the mouth or throat 4 (four) times daily as needed (cough).     . Menthol, Topical Analgesic, (BLUE-EMU MAXIMUM STRENGTH EX) Apply 1 application topically daily as needed (arthritis pain).    . Multiple Vitamin (MULTIVITAMIN WITH MINERALS) TABS tablet Take 1 tablet by mouth on Monday, Wednesday, Friday with supper     . Neomy-Bacit-Polymyx-Pramoxine (TRIPLE ANTIBIOTIC PAIN RELIEF EX) Apply 1 application topically daily as needed (wound care).    . predniSONE (DELTASONE) 10 MG tablet Label  & dispense according to the schedule below. 4Pills PO for 3 days then, 3 Pills PO for 3 days, 2 Pills PO for 2 days, 1 Pills PO for 1 days, 1/2 Pill  PO for 1 day then STOP. 27 tablet 0    . amLODipine (NORVASC) 5 MG tablet Take 1 tablet (5 mg total) by mouth daily. (Patient taking differently: Take 5 mg by mouth daily with supper. ) 90 tablet 1  . fluticasone (FLONASE) 50 MCG/ACT nasal spray USE 2 SPRAYS IN EACH NOSTRIL ONE TIME DAILY (Patient taking differently: Place 2 sprays into both nostrils daily. ) 48 g 1  . meloxicam (MOBIC) 15 MG tablet TAKE 1 TABLET (15 MG TOTAL) BY MOUTH DAILY AS NEEDED FOR PAIN (WITH FOOD). (Patient taking differently: Take 15 mg by mouth at bedtime. ) 90 tablet 1  . montelukast (SINGULAIR) 10 MG tablet Take 1 tablet (10 mg total) by mouth daily. (Patient taking differently: Take 10 mg by mouth at bedtime. ) 90 tablet 3  . Tiotropium Bromide Monohydrate (SPIRIVA RESPIMAT) 2.5 MCG/ACT AERS Inhale 2 puffs into the lungs daily. (Patient taking differently: Inhale 1 puff into the lungs daily. ) 3 Inhaler 1   No facility-administered medications prior to visit.      Allergies:   Patient has no known allergies.   Social History   Social History  . Marital status: Married    Spouse name: N/A  . Number of children: N/A  . Years of education: N/A   Occupational History  . retired Music therapist    Social History Main Topics  . Smoking status: Former Smoker    Packs/day: 1.50    Years: 38.00    Types: Cigarettes    Quit date: 09/03/2000  . Smokeless tobacco: Never Used  . Alcohol use 1.2 - 1.8 oz/week    2 - 3 Cans of beer per week  . Drug use: No  . Sexual activity: No   Other Topics Concern  . None   Social History Narrative   Retired Music therapist   Patient states former smoker. 1 1/2 ppd x 38 yrs  Quit in Jan 2002   Married - 2 weeks (4th marriage)   divorced,  remarried 84-2004 (lost wife to lung ca),  remarried (divorced),    1 son  - 64 [name])   Alcohol use-yes (2-3 beers per week)             Family History:  The patient's family history includes Colon cancer in his paternal grandmother; Heart failure in his  mother; Obesity in his brother; Obstructive Sleep Apnea in his brother; Prostate cancer in his father.      ROS:   Please see the history of present illness.    ROS All other systems reviewed and are negative.   PHYSICAL EXAM:   VS:  BP (!) 144/80   Pulse 79   Ht 5\' 7"  (1.702 m)  Wt 151 lb (68.5 kg)   SpO2 95%   BMI 23.65 kg/m    GEN: Well nourished, well developed, in no acute distress  HEENT: normal  Neck: no JVD, carotid bruits, or masses Cardiac: RRR; no murmurs, rubs, or gallops,no edema  Respiratory:  clear to auscultation bilaterally, normal work of breathing GI: soft, nontender, nondistended, + BS MS: no deformity or atrophy  Skin: warm and dry, no rash Neuro:  Alert and Oriented x 3, Strength and sensation are intact Psych: euthymic mood, full affect    Wt Readings from Last 3 Encounters:  10/08/16 151 lb (68.5 kg)  09/28/16 148 lb (67.1 kg)  09/10/16 148 lb 6.4 oz (67.3 kg)      Studies/Labs Reviewed:   EKG:  EKG is NOT ordered today.   Recent Labs: 09/10/2016: TSH 0.42 09/29/2016: ALT 19 09/30/2016: BUN 12; Creatinine, Ser 0.72; Hemoglobin 12.6; Magnesium 2.0; Platelets 280; Potassium 4.1; Sodium 140   Lipid Panel    Component Value Date/Time   CHOL 181 09/10/2016 1428   TRIG 144.0 09/10/2016 1428   HDL 61.30 09/10/2016 1428   CHOLHDL 3 09/10/2016 1428   VLDL 28.8 09/10/2016 1428   LDLCALC 91 09/10/2016 1428   LDLDIRECT 85 07/22/2014 1031    Additional studies/ records that were reviewed today include:  2D ECHO: 01/03/2015 LV EF: 55% - 60% Study Conclusions - Left ventricle: The cavity size was normal. Wall thickness was normal. Systolic function was normal. The estimated ejection fraction was in the range of 55% to 60%. Wall motion was normal; there were no regional wall motion abnormalities. Doppler parameters are consistent with abnormal left ventricular relaxation (grade 1 diastolic dysfunction). The E/e&' ratio is between  8-15, suggseting indeterminate LV Filling pressure. - Left atrium: The atrium was normal in size. - Right atrium: The atrium was at the upper limits of normal in size. - Inferior vena cava: The vessel was normal in size. The respirophasic diameter changes were in the normal range (>= 50%), consistent with normal central venous pressure. Impressions: - Compared to a prior echo in 2014, there do not appear to be any significant changes   ASSESSMENT & PLAN:   HTN: BP borderline but said he did not tolerate increased BP medications (ARB) in the past because he got lightheaded. Will follow for now.   PVCs: stable. Continue BB  COPD: stable now. Follow up CXR with no PNA.    Medication Adjustments/Labs and Tests Ordered: Current medicines are reviewed at length with the patient today.  Concerns regarding medicines are outlined above.  Medication changes, Labs and Tests ordered today are listed in the Patient Instructions below. Patient Instructions  Medication Instructions:  Your physician recommends that you continue on your current medications as directed. Please refer to the Current Medication list given to you today.   Labwork: None ordered  Testing/Procedures: None ordered  Follow-Up: Your physician wants you to follow-up in: [place] DR. Johnsie Cancel   You will receive a reminder letter in the mail two months in advance. If you don't receive a letter, please call our office to schedule the follow-up appointment.   Any Other Special Instructions Will Be Listed Below (If Applicable).     If you need a refill on your cardiac medications before your next appointment, please call your pharmacy.      Signed, Angelena Form, PA-C  10/08/2016 8:54 AM    [facility] [address], [place], [place]  [address] Phone: [phone]; Fax: [phone]

## 2016-10-07 NOTE — Progress Notes (Signed)
Called spoke with patient, advised of cxr results / recs as stated by TP.  Pt verbalized his understanding and denied any questions. 

## 2016-10-07 NOTE — Progress Notes (Signed)
@Patient  ID: [name], male    DOB: 28-Jul-1946, 71 y.o.   MRN: [id number]  Chief Complaint  Patient presents with  . Follow-up    COPD     Referring provider: Mosie Lukes, MD  HPI: 71 yo male former smoker with COPD  (Spirometry FEV1 53%, ratio 54-2014) Lung abscess in 1996 s/p RML resection   10/07/2016 [facility] follow up -COPD exacerbatioin  Patient presents for a post hospital follow-up. Patient was recently admitted for COPD  Exacerbation and CAP . Treated with IV antibiotics, steroids, nebulized broncho-dilators. Influenza panel negative. Since discharge. Patient is feeling better . Appetite is good w/ no n/v.d. Remains on Symbicort and Spiriva .  [facility] hospital notes/discharge reviewed   No Known Allergies  Immunization History  Administered Date(s) Administered  . Influenza Split 06/02/2012  . Influenza Whole 05/15/2010, 05/02/2011, 05/18/2013  . Influenza,inj,Quad PF,36+ Mos 05/14/2014, 05/25/2015  . Influenza-Unspecified 05/15/2016  . Pneumococcal Conjugate-13 05/20/2013, 05/15/2016  . Pneumococcal Polysaccharide-23 07/03/2001, 05/02/2011  . Td 05/15/2004  . Tdap 02/17/2014  . Zoster 07/16/2011    Past Medical History:  Diagnosis Date  . Allergic rhinitis   . Atypical chest pain 12/01/2014  . Back pain 01/12/2013  . COPD (chronic obstructive pulmonary disease) (HCC)    FeV1 64%-2007  . Emphysema   . GERD (gastroesophageal reflux disease)   . History of lung abscess    bronchiectasis with RMLandRLL ersection -1996- Dr Arlyce Dice  . Hordeolum externum (stye) 04/23/2015   Right eye  . Hypertension   . Impaired vision    glasses  . Osteoarthritis    hands and knees  . Sinusitis, acute 04/23/2015    Tobacco History: History  Smoking Status  . Former Smoker  . Packs/day: 1.50  . Years: 38.00  . Types: Cigarettes  . Quit date: 09/03/2000  Smokeless Tobacco  . Never Used   Counseling given: Not Answered   Outpatient Encounter Prescriptions as  of 10/07/2016  Medication Sig  . albuterol (PROVENTIL HFA;VENTOLIN HFA) 108 (90 BASE) MCG/ACT inhaler Inhale 2 puffs into the lungs every 6 (six) hours as needed for wheezing or shortness of breath.  Marland Kitchen amLODipine (NORVASC) 5 MG tablet Take 1 tablet (5 mg total) by mouth daily. (Patient taking differently: Take 5 mg by mouth daily with supper. )  . aspirin-acetaminophen-caffeine (EXCEDRIN MIGRAINE) 250-250-65 MG per tablet Take 2 tablets by mouth every 6 (six) hours as needed (arthritis pain).   . benzonatate (TESSALON) 100 MG capsule Take 1 capsule (100 mg total) by mouth 3 (three) times daily as needed for cough.  . budesonide-formoterol (SYMBICORT) 160-4.5 MCG/ACT inhaler Inhale 2 puffs into the lungs 2 (two) times daily.  . calcium elemental as carbonate (TUMS ULTRA 1000) 400 MG chewable tablet Chew 1,000 mg by mouth 2 (two) times daily.  . Capsaicin-Menthol (SALONPAS GEL EX) Apply 1 application topically daily as needed (arthritis pain).  . fluticasone (FLONASE) 50 MCG/ACT nasal spray USE 2 SPRAYS IN EACH NOSTRIL ONE TIME DAILY (Patient taking differently: Place 2 sprays into both nostrils daily. )  . Glucosamine-Chondroitin 750-600 MG TABS Take 1 tablet by mouth 2 (two) times daily.  . Guaifenesin (MUCINEX MAXIMUM STRENGTH) 1200 MG TB12 Take 600 mg by mouth 2 (two) times daily.  Marland Kitchen loratadine (CLARITIN) 10 MG tablet Take 10 mg by mouth daily.  . meloxicam (MOBIC) 15 MG tablet TAKE 1 TABLET (15 MG TOTAL) BY MOUTH DAILY AS NEEDED FOR PAIN (WITH FOOD). (Patient taking differently: Take 15 mg  by mouth at bedtime. )  . Menthol (ROBITUSSIN COUGH DROPS MT) Use as directed 1 lozenge in the mouth or throat 4 (four) times daily as needed (cough).   . Menthol, Topical Analgesic, (BLUE-EMU MAXIMUM STRENGTH EX) Apply 1 application topically daily as needed (arthritis pain).  . montelukast (SINGULAIR) 10 MG tablet Take 1 tablet (10 mg total) by mouth daily. (Patient taking differently: Take 10 mg by mouth at  bedtime. )  . Multiple Vitamin (MULTIVITAMIN WITH MINERALS) TABS tablet Take 1 tablet by mouth on Monday, Wednesday, Friday with supper   . Neomy-Bacit-Polymyx-Pramoxine (TRIPLE ANTIBIOTIC PAIN RELIEF EX) Apply 1 application topically daily as needed (wound care).  . predniSONE (DELTASONE) 10 MG tablet Label  & dispense according to the schedule below. 4Pills PO for 3 days then, 3 Pills PO for 3 days, 2 Pills PO for 2 days, 1 Pills PO for 1 days, 1/2 Pill  PO for 1 day then STOP.  . Tiotropium Bromide Monohydrate (SPIRIVA RESPIMAT) 2.5 MCG/ACT AERS Inhale 2 puffs into the lungs daily. (Patient taking differently: Inhale 1 puff into the lungs daily. )   No facility-administered encounter medications on file as of 10/07/2016.      Review of Systems  Constitutional:   No  weight loss, night sweats,  Fevers, chills, + fatigue, or  lassitude.  HEENT:   No headaches,  Difficulty swallowing,  Tooth/dental problems, or  Sore throat,                No sneezing, itching, ear ache, nasal congestion, post nasal drip,   CV:  No chest pain,  Orthopnea, PND, swelling in lower extremities, anasarca, dizziness, palpitations, syncope.   GI  No heartburn, indigestion, abdominal pain, nausea, vomiting, diarrhea, change in bowel habits, loss of appetite, bloody stools.   Resp:  .  No wheezing.  No chest wall deformity  Skin: no rash or lesions.  GU: no dysuria, change in color of urine, no urgency or frequency.  No flank pain, no hematuria   MS:  No joint pain or swelling.  No decreased range of motion.  No back pain.    Physical Exam  BP (!) 142/78 (BP Location: Left Arm, Cuff Size: Normal)   Pulse 80   SpO2 97%   GEN: A/Ox3; pleasant , NAD, elderly    HEENT:  [place]/AT,  EACs-clear, TMs-wnl, NOSE-clear, THROAT-clear, no lesions, no postnasal drip or exudate noted.   NECK:  Supple w/ fair ROM; no JVD; normal carotid impulses w/o bruits; no thyromegaly or nodules palpated; no lymphadenopathy.     RESP  Decreased BS in bases,  no accessory muscle use, no dullness to percussion  CARD:  RRR, no m/r/g, no peripheral edema, pulses intact, no cyanosis or clubbing.  GI:   Soft & nt; nml bowel sounds; no organomegaly or masses detected.   Musco: Warm bil, no deformities or joint swelling noted.   Neuro: alert, no focal deficits noted.    Skin: Warm, no lesions or rashes  Psych:  No change in mood or affect. No depression or anxiety.  No memory loss.  Lab Results:  CBC    Component Value Date/Time   WBC 9.0 09/30/2016 0535   RBC 4.07 (L) 09/30/2016 0535   HGB 12.6 (L) 09/30/2016 0535   HCT 37.9 (L) 09/30/2016 0535   PLT 280 09/30/2016 0535   MCV 93.1 09/30/2016 0535   MCH 31.0 09/30/2016 0535   MCHC 33.2 09/30/2016 0535   RDW 13.1 09/30/2016 0535  LYMPHSABS 1.5 09/28/2016 0625   MONOABS 0.8 09/28/2016 0625   EOSABS 0.1 09/28/2016 0625   BASOSABS 0.0 09/28/2016 0625    BMET    Component Value Date/Time   NA 140 09/30/2016 0535   K 4.1 09/30/2016 0535   CL 110 09/30/2016 0535   CO2 24 09/30/2016 0535   GLUCOSE 127 (H) 09/30/2016 0535   BUN 12 09/30/2016 0535   CREATININE 0.72 09/30/2016 0535   CREATININE 0.75 07/22/2014 1031   CALCIUM 9.1 09/30/2016 0535   GFRNONAA >60 09/30/2016 0535   GFRNONAA >89 07/22/2014 1031   GFRAA >60 09/30/2016 0535   GFRAA >89 07/22/2014 1031    BNP No results found for: BNP  ProBNP    Component Value Date/Time   PROBNP 18.0 07/20/2013 1403    Imaging: Dg Chest 2 View  Result Date: 09/29/2016 CLINICAL DATA:  Shortness of breath, community acquired pneumonia EXAM: CHEST  2 VIEW COMPARISON:  09/28/2016 FINDINGS: Patchy opacity in the lingula/left base again noted, stable. Postoperative changes on the right with chronic scarring and volume loss. Heart is normal size. No effusions. IMPRESSION: No change in the lingular/left basilar opacity since prior study. Electronically Signed   By: Rolm Baptise M.D.   On: 09/29/2016 08:34    Dg Chest 2 View  Result Date: 09/28/2016 CLINICAL DATA:  Initial evaluation for acute shortness of breath, cough, fever. EXAM: CHEST  2 VIEW COMPARISON:  Prior radiograph from 08/27/2016. FINDINGS: Cardiac and mediastinal silhouettes are stable, and remain within normal limits. Post treatment/ postsurgical changes present at the right hemithorax with associated volume loss and scarring, stable. Right lung is grossly clear. There is patchy opacity at the mid left lung base, concerning for possible infiltrate. Left lung is otherwise clear. No pulmonary edema or pleural effusion. No pneumothorax. No acute osseus abnormality. Postsurgical changes noted within the lower lumbar spine. Cervical ACDF noted as well. IMPRESSION: 1. Patchy left basilar opacity, concerning for pneumonia given the history of cough and fever. 2. Stable post treatment changes within the right lung with associated volume loss. Electronically Signed   By: Jeannine Boga M.D.   On: 09/28/2016 05:21     Assessment & Plan:   No problem-specific Assessment & Plan notes found for this encounter.     Rexene Edison, NP 10/07/2016

## 2016-10-07 NOTE — Assessment & Plan Note (Signed)
Recent flare with CAP , improving after abx and steroids   Plan  Patient Instructions  Continue on current regimen  Chest xray today  follow up Dr. Elsworth Soho  In 6-8 weeks and As needed

## 2016-10-07 NOTE — Assessment & Plan Note (Signed)
CAP improving after abx  Check cxr today   Plan  Patient Instructions  Continue on current regimen  Chest xray today  follow up Dr. Elsworth Soho  In 6-8 weeks and As needed

## 2016-10-07 NOTE — Patient Instructions (Signed)
Continue on current regimen  Chest xray today  follow up Dr. Elsworth Soho  In 6-8 weeks and As needed

## 2016-10-08 ENCOUNTER — Ambulatory Visit (INDEPENDENT_AMBULATORY_CARE_PROVIDER_SITE_OTHER): Payer: Medicare HMO | Admitting: Physician Assistant

## 2016-10-08 ENCOUNTER — Encounter: Payer: Self-pay | Admitting: Physician Assistant

## 2016-10-08 VITALS — BP 144/80 | HR 79 | Ht 67.0 in | Wt 151.0 lb

## 2016-10-08 DIAGNOSIS — J449 Chronic obstructive pulmonary disease, unspecified: Secondary | ICD-10-CM | POA: Diagnosis not present

## 2016-10-08 DIAGNOSIS — I493 Ventricular premature depolarization: Secondary | ICD-10-CM

## 2016-10-08 DIAGNOSIS — I10 Essential (primary) hypertension: Secondary | ICD-10-CM | POA: Diagnosis not present

## 2016-10-08 NOTE — Patient Instructions (Signed)
Medication Instructions:  Your physician recommends that you continue on your current medications as directed. Please refer to the Current Medication list given to you today.   Labwork: None ordered  Testing/Procedures: None ordered  Follow-Up: Your physician wants you to follow-up in: 6 MONTHS WITH DR. NISHAN  You will receive a reminder letter in the mail two months in advance. If you don't receive a letter, please call our office to schedule the follow-up appointment.    Any Other Special Instructions Will Be Listed Below (If Applicable).     If you need a refill on your cardiac medications before your next appointment, please call your pharmacy.   

## 2016-10-10 NOTE — Progress Notes (Signed)
Reviewed & agree with plan  

## 2016-10-11 ENCOUNTER — Ambulatory Visit (INDEPENDENT_AMBULATORY_CARE_PROVIDER_SITE_OTHER): Payer: Medicare HMO | Admitting: Family Medicine

## 2016-10-11 ENCOUNTER — Encounter: Payer: Self-pay | Admitting: Family Medicine

## 2016-10-11 VITALS — BP 150/82 | HR 73 | Temp 97.8°F | Wt 149.6 lb

## 2016-10-11 DIAGNOSIS — J189 Pneumonia, unspecified organism: Secondary | ICD-10-CM | POA: Diagnosis not present

## 2016-10-11 DIAGNOSIS — J181 Lobar pneumonia, unspecified organism: Secondary | ICD-10-CM | POA: Diagnosis not present

## 2016-10-11 DIAGNOSIS — I10 Essential (primary) hypertension: Secondary | ICD-10-CM

## 2016-10-11 DIAGNOSIS — K219 Gastro-esophageal reflux disease without esophagitis: Secondary | ICD-10-CM

## 2016-10-11 LAB — COMPREHENSIVE METABOLIC PANEL
ALT: 22 U/L (ref 0–53)
AST: 22 U/L (ref 0–37)
Albumin: 3.9 g/dL (ref 3.5–5.2)
Alkaline Phosphatase: 64 U/L (ref 39–117)
BUN: 26 mg/dL — ABNORMAL HIGH (ref 6–23)
CO2: 28 mEq/L (ref 19–32)
Calcium: 9.3 mg/dL (ref 8.4–10.5)
Chloride: 103 mEq/L (ref 96–112)
Creatinine, Ser: 0.78 mg/dL (ref 0.40–1.50)
GFR: 104.61 mL/min (ref >60.00)
Glucose, Bld: 95 mg/dL (ref 70–99)
Potassium: 4.1 mEq/L (ref 3.5–5.1)
Sodium: 137 mEq/L (ref 135–145)
Total Bilirubin: 0.8 mg/dL (ref 0.2–1.2)
Total Protein: 6.8 g/dL (ref 6.0–8.3)

## 2016-10-11 LAB — CBC WITH DIFFERENTIAL/PLATELET
Basophils Absolute: 0 10*3/uL (ref 0.0–0.1)
Basophils Relative: 0.3 % (ref 0.0–3.0)
Eosinophils Absolute: 0 10*3/uL (ref 0.0–0.7)
Eosinophils Relative: 0.1 % (ref 0.0–5.0)
HCT: 43 % (ref 39.0–52.0)
Hemoglobin: 14.4 g/dL (ref 13.0–17.0)
Lymphocytes Relative: 11 % — ABNORMAL LOW (ref 12.0–46.0)
Lymphs Abs: 1 10*3/uL (ref 0.7–4.0)
MCHC: 33.6 g/dL (ref 30.0–36.0)
MCV: 94.7 fl (ref 78.0–100.0)
Monocytes Absolute: 0.3 10*3/uL (ref 0.1–1.0)
Monocytes Relative: 3.3 % (ref 3.0–12.0)
Neutro Abs: 7.8 10*3/uL — ABNORMAL HIGH (ref 1.4–7.7)
Neutrophils Relative %: 85.3 % — ABNORMAL HIGH (ref 43.0–77.0)
Platelets: 334 10*3/uL (ref 150.0–400.0)
RBC: 4.54 Mil/uL (ref 4.22–5.81)
RDW: 13.6 % (ref 11.5–15.5)
WBC: 9.2 10*3/uL (ref 4.0–10.5)

## 2016-10-11 MED ORDER — METHYLPREDNISOLONE 4 MG PO TABS: ORAL_TABLET | ORAL | 0 refills | Status: DC

## 2016-10-11 MED ORDER — AMOXICILLIN-POT CLAVULANATE 875-125 MG PO TABS: 1.0000 | ORAL_TABLET | Freq: Two times a day (BID) | ORAL | 0 refills | Status: DC

## 2016-10-11 MED ORDER — METHYLPREDNISOLONE ACETATE 40 MG/ML IJ SUSP
20.0000 mg | Freq: Once | INTRAMUSCULAR | Status: DC
Start: 2016-10-11 — End: 2017-09-15

## 2016-10-11 MED FILL — AMOX-CLAV 875-125 MG TABLET: 875-125 | 10 days supply | Qty: 20 | Fill #0

## 2016-10-11 MED FILL — METHYLPREDNISOLONE 4 MG TAB: 4 | 5 days supply | Qty: 15 | Fill #0

## 2016-10-11 NOTE — Patient Instructions (Signed)
Can take antihistamines (Zyrtec/Cetirizine, Claritin/Loratadine) twice daily on a bad day. Take 1 dose daily and increase to 2 doses in preferred combination as needed. Benadryl 25 mg can be taken once at bedtime as needed and if only taking one dose of other antihistamines daily can take a dose of Benadryl as needed for yard   [place], 10 strain probiotic daily at [employer] or online at [email or web address] or [employer]   Community-Acquired Pneumonia, Adult Introduction Pneumonia is an infection of the lungs. One type of pneumonia can happen while a person is in a hospital. A different type can happen when a person is not in a hospital (community-acquired pneumonia). It is easy for this kind to spread from person to person. It can spread to you if you breathe near an infected person who coughs or sneezes. Some symptoms include:  A dry cough.  A wet (productive) cough.  Fever.  Sweating.  Chest pain. Follow these instructions at home:  Take over-the-counter and prescription medicines only as told by your doctor.  Only take cough medicine if you are losing sleep.  If you were prescribed an antibiotic medicine, take it as told by your doctor. Do not stop taking the antibiotic even if you start to feel better.  Sleep with your head and neck raised (elevated). You can do this by putting a few pillows under your head, or you can sleep in a recliner.  Do not use tobacco products. These include cigarettes, chewing tobacco, and e-cigarettes. If you need help quitting, ask your doctor.  Drink enough water to keep your pee (urine) clear or pale yellow. A shot (vaccine) can help prevent pneumonia. Shots are often suggested for:  People older than 70 years of age.  People older than 69 years of age:  Who are having cancer treatment.  Who have long-term (chronic) lung disease.  Who have problems with their body's defense system (immune system). You may also prevent pneumonia if  you take these actions:  Get the flu (influenza) shot every year.  Go to the dentist as often as told.  Wash your hands often. If soap and water are not available, use hand sanitizer. Contact a doctor if:  You have a fever.  You lose sleep because your cough medicine does not help. Get help right away if:  You are short of breath and it gets worse.  You have more chest pain.  Your sickness gets worse. This is very serious if:  You are an older adult.  Your body's defense system is weak.  You cough up blood. This information is not intended to replace advice given to you by your health care provider. Make sure you discuss any questions you have with your health care provider. Document Released: 02/05/2008 Document Revised: 01/25/2016 Document Reviewed: 12/14/2014  2017 [employer] work.

## 2016-10-11 NOTE — Progress Notes (Signed)
Patient ID: ABDISHAKUR RYMAN, male   DOB: 1946/06/28, 71 y.o.   MRN: [id number]   Subjective:    Patient ID: [name], male    DOB: 1946-02-11, 71 y.o.   MRN: [id number]  Chief Complaint  Patient presents with  . Hospitalization Follow-up  I acted as a Education administrator for Dr. Charlett Blake. Princess, RMA   HPI  Patient is in today for hospital follow, patient states he does not feel better. Patient complains of being "weak" and his chest is full of "congestion" . He was recently hospitalized with pneumonia. With treatment in the hospital he was improving  But over the past couple of days his fatigue, weakness, congestion and cough are worsening again. No obvious fevers or chills. Sputum is brown and thick. Denies CP/palp/SOB/HA/fevers/GU c/o. Taking meds as prescribed  Past Medical History:  Diagnosis Date  . Allergic rhinitis   . Atypical chest pain 12/01/2014  . Back pain 01/12/2013  . COPD (chronic obstructive pulmonary disease) (HCC)    FeV1 64%-2007  . Emphysema   . GERD (gastroesophageal reflux disease)   . History of lung abscess    bronchiectasis with RMLandRLL ersection -1996- Dr Arlyce Dice  . Hordeolum externum (stye) 04/23/2015   Right eye  . Hypertension   . Impaired vision    glasses  . Osteoarthritis    hands and knees  . Sinusitis, acute 04/23/2015    Past Surgical History:  Procedure Laterality Date  . HERNIA REPAIR  10/2009  . left knee    . lower back surgery  09/2008   06/2009  . LUNG SURGERY     RML andRUL removed due to bleeding and bronchiectasis and lung abcess  . NECK SURGERY  05/2008  . right foot surgery      Family History  Problem Relation Age of Onset  . Prostate cancer Father     father died prostate ca  . Coronary artery disease      1st degree relative<60  . Stroke      1st degree relative<50  . Heart failure Mother     age 74  . Colon cancer Paternal Grandmother   . Obstructive Sleep Apnea Brother   . Obesity Brother     Social History   Social  History  . Marital status: Married    Spouse name: N/A  . Number of children: N/A  . Years of education: N/A   Occupational History  . retired Music therapist    Social History Main Topics  . Smoking status: Former Smoker    Packs/day: 1.50    Years: 38.00    Types: Cigarettes    Quit date: 09/03/2000  . Smokeless tobacco: Never Used  . Alcohol use 1.2 - 1.8 oz/week    2 - 3 Cans of beer per week  . Drug use: No  . Sexual activity: No   Other Topics Concern  . Not on file   Social History Narrative   Retired Music therapist   Patient states former smoker. 1 1/2 ppd x 38 yrs  Quit in Jan 2002   Married - 2 weeks (4th marriage)   divorced,  remarried 84-2004 (lost wife to lung ca),  remarried (divorced),    1 son  - 17 [name])   Alcohol use-yes (2-3 beers per week)            Outpatient Medications Prior to Visit  Medication Sig Dispense Refill  . albuterol (PROVENTIL HFA;VENTOLIN HFA) 108 (90 BASE)  MCG/ACT inhaler Inhale 2 puffs into the lungs every 6 (six) hours as needed for wheezing or shortness of breath. 3 Inhaler 3  . amLODipine (NORVASC) 5 MG tablet Take 5 mg by mouth daily.    Marland Kitchen aspirin-acetaminophen-caffeine (EXCEDRIN MIGRAINE) 250-250-65 MG per tablet Take 2 tablets by mouth every 6 (six) hours as needed (arthritis pain).     . benzonatate (TESSALON) 100 MG capsule Take 1 capsule (100 mg total) by mouth 3 (three) times daily as needed for cough. 20 capsule 0  . budesonide-formoterol (SYMBICORT) 160-4.5 MCG/ACT inhaler Inhale 2 puffs into the lungs 2 (two) times daily. 3 Inhaler 0  . calcium elemental as carbonate (TUMS ULTRA 1000) 400 MG chewable tablet Chew 1,000 mg by mouth 2 (two) times daily.    . Capsaicin-Menthol (SALONPAS GEL EX) Apply 1 application topically daily as needed (arthritis pain).    . fluticasone (FLONASE) 50 MCG/ACT nasal spray Place 2 sprays into both nostrils daily.    . Glucosamine-Chondroitin 750-600 MG TABS Take 1 tablet by  mouth 2 (two) times daily.    . Guaifenesin (MUCINEX MAXIMUM STRENGTH) 1200 MG TB12 Take 600 mg by mouth 2 (two) times daily.    Marland Kitchen loratadine (CLARITIN) 10 MG tablet Take 10 mg by mouth daily.    . meloxicam (MOBIC) 15 MG tablet Take 15 mg by mouth daily.    . Menthol (ROBITUSSIN COUGH DROPS MT) Use as directed 1 lozenge in the mouth or throat 4 (four) times daily as needed (cough).     . Menthol, Topical Analgesic, (BLUE-EMU MAXIMUM STRENGTH EX) Apply 1 application topically daily as needed (arthritis pain).    . montelukast (SINGULAIR) 10 MG tablet Take 10 mg by mouth at bedtime.    . Multiple Vitamin (MULTIVITAMIN WITH MINERALS) TABS tablet Take 1 tablet by mouth on Monday, Wednesday, Friday with supper     . Neomy-Bacit-Polymyx-Pramoxine (TRIPLE ANTIBIOTIC PAIN RELIEF EX) Apply 1 application topically daily as needed (wound care).    . predniSONE (DELTASONE) 10 MG tablet Label  & dispense according to the schedule below. 4Pills PO for 3 days then, 3 Pills PO for 3 days, 2 Pills PO for 2 days, 1 Pills PO for 1 days, 1/2 Pill  PO for 1 day then STOP. 27 tablet 0  . Tiotropium Bromide Monohydrate (SPIRIVA RESPIMAT) 2.5 MCG/ACT AERS Inhale 1 puff into the lungs daily.     No facility-administered medications prior to visit.     No Known Allergies  Review of Systems  Constitutional: Positive for malaise/fatigue. Negative for fever.  HENT: Positive for congestion.   Eyes: Negative for blurred vision.  Respiratory: Positive for cough, sputum production, shortness of breath and wheezing.   Cardiovascular: Positive for palpitations. Negative for chest pain and leg swelling.       Irregular rhythm  Gastrointestinal: Negative for vomiting.  Musculoskeletal: Negative for back pain.  Skin: Negative for rash.  Neurological: Positive for weakness and headaches. Negative for loss of consciousness.       Objective:    Physical Exam  Constitutional: He is oriented to person, place, and time. He  appears well-developed and well-nourished. No distress.  HENT:  Head: Normocephalic and atraumatic.  Eyes: Conjunctivae are normal.  Neck: Normal range of motion. No thyromegaly present.  Cardiovascular: Normal rate and regular rhythm.   Pulmonary/Chest: Effort normal. No respiratory distress. He has no wheezes.  B/l bases with diminished BS  Abdominal: Soft. Bowel sounds are normal. There is no tenderness.  Musculoskeletal:  Normal range of motion. He exhibits no edema or deformity.  Neurological: He is alert and oriented to person, place, and time.  Skin: Skin is warm and dry. He is not diaphoretic.  Psychiatric: He has a normal mood and affect.    BP (!) 150/82 (BP Location: Left Arm, Patient Position: Sitting, Cuff Size: Normal)   Pulse 73   Temp 97.8 F (36.6 C) (Oral)   Wt 149 lb 9.6 oz (67.9 kg)   SpO2 97%   BMI 23.43 kg/m  Wt Readings from Last 3 Encounters:  10/11/16 149 lb 9.6 oz (67.9 kg)  10/08/16 151 lb (68.5 kg)  09/28/16 148 lb (67.1 kg)     Lab Results  Component Value Date   WBC 9.2 10/11/2016   HGB 14.4 10/11/2016   HCT 43.0 10/11/2016   PLT 334.0 10/11/2016   GLUCOSE 95 10/11/2016   CHOL 181 09/10/2016   TRIG 144.0 09/10/2016   HDL 61.30 09/10/2016   LDLDIRECT 85 07/22/2014   LDLCALC 91 09/10/2016   ALT 22 10/11/2016   AST 22 10/11/2016   NA 137 10/11/2016   K 4.1 10/11/2016   CL 103 10/11/2016   CREATININE 0.78 10/11/2016   BUN 26 (H) 10/11/2016   CO2 28 10/11/2016   TSH 0.42 09/10/2016   PSA 1.91 09/10/2016   INR 1.18 09/28/2016   HGBA1C 5.7 07/22/2014    Lab Results  Component Value Date   TSH 0.42 09/10/2016   Lab Results  Component Value Date   WBC 9.2 10/11/2016   HGB 14.4 10/11/2016   HCT 43.0 10/11/2016   MCV 94.7 10/11/2016   PLT 334.0 10/11/2016   Lab Results  Component Value Date   NA 137 10/11/2016   K 4.1 10/11/2016   CO2 28 10/11/2016   GLUCOSE 95 10/11/2016   BUN 26 (H) 10/11/2016   CREATININE 0.78 10/11/2016     BILITOT 0.8 10/11/2016   ALKPHOS 64 10/11/2016   AST 22 10/11/2016   ALT 22 10/11/2016   PROT 6.8 10/11/2016   ALBUMIN 3.9 10/11/2016   CALCIUM 9.3 10/11/2016   ANIONGAP 6 09/30/2016   GFR 104.61 10/11/2016   Lab Results  Component Value Date   CHOL 181 09/10/2016   Lab Results  Component Value Date   HDL 61.30 09/10/2016   Lab Results  Component Value Date   LDLCALC 91 09/10/2016   Lab Results  Component Value Date   TRIG 144.0 09/10/2016   Lab Results  Component Value Date   CHOLHDL 3 09/10/2016   Lab Results  Component Value Date   HGBA1C 5.7 07/22/2014       Assessment & Plan:   Problem List Items Addressed This Visit    Benign essential HTN    Mild elevation with acute illness no change in meds today      GERD    Avoid offending foods, start probiotics. Do not eat large meals in late evening and consider raising head of bed.       CAP (community acquired pneumonia)    Is noting an increase in respiratory symptoms again today. Start Augmentin, given shot of Depo Medrol and will start a medrol dosepak tomorrow. Encouraged Mucinex and probiotics. Encouraged increased rest and hydration, add probiotics, zinc such as Coldeze or Xicam. Treat fevers as needed      Relevant Medications   amoxicillin-clavulanate (AUGMENTIN) 875-125 MG tablet    Other Visit Diagnoses    Pneumonia due to infectious organism, unspecified laterality, unspecified part  of lung    -  Primary   Relevant Medications   methylPREDNISolone acetate (DEPO-MEDROL) injection 20 mg   methylPREDNISolone (MEDROL) 4 MG tablet   amoxicillin-clavulanate (AUGMENTIN) 875-125 MG tablet   Other Relevant Orders   Comprehensive metabolic panel (Completed)   CBC w/Diff (Completed)      I am having Mr. Apostolopoulos maintain his aspirin-acetaminophen-caffeine, loratadine, Menthol (ROBITUSSIN COUGH DROPS MT), albuterol, budesonide-formoterol, Guaifenesin, Glucosamine-Chondroitin, calcium elemental as  carbonate, multivitamin with minerals, Capsaicin-Menthol (SALONPAS GEL EX), (Menthol, Topical Analgesic, (BLUE-EMU MAXIMUM STRENGTH EX)), Neomy-Bacit-Polymyx-Pramoxine (TRIPLE ANTIBIOTIC PAIN RELIEF EX), predniSONE, benzonatate, amLODipine, fluticasone, meloxicam, montelukast, Tiotropium Bromide Monohydrate, methylPREDNISolone, and amoxicillin-clavulanate. We will continue to administer methylPREDNISolone acetate.  Meds ordered this encounter  Medications  . DISCONTD: amoxicillin-clavulanate (AUGMENTIN) 875-125 MG tablet    Sig: Take 1 tablet by mouth 2 (two) times daily.    Dispense:  20 tablet    Refill:  0  . methylPREDNISolone acetate (DEPO-MEDROL) injection 20 mg  . DISCONTD: methylPREDNISolone (MEDROL) 4 MG tablet    Sig: 5 tab po qd X 1d then 4 tab po qd X 1d then 3 tab po qd X 1d then 2 tab po qd then 1 tab po qd    Dispense:  15 tablet    Refill:  0  . methylPREDNISolone (MEDROL) 4 MG tablet    Sig: 5 tab po qd X 1d then 4 tab po qd X 1d then 3 tab po qd X 1d then 2 tab po qd then 1 tab po qd    Dispense:  15 tablet    Refill:  0  . amoxicillin-clavulanate (AUGMENTIN) 875-125 MG tablet    Sig: Take 1 tablet by mouth 2 (two) times daily.    Dispense:  20 tablet    Refill:  0  . DISCONTD: amoxicillin-clavulanate (AUGMENTIN) 875-125 MG tablet    Sig: Take 1 tablet by mouth 2 (two) times daily.    Dispense:  20 tablet    Refill:  0    CMA served as scribe during this visit. History, Physical and Plan performed by medical provider. Documentation and orders reviewed and attested to.  Penni Homans, MD

## 2016-10-11 NOTE — Progress Notes (Signed)
Pre visit review using our clinic review tool, if applicable. No additional management support is needed unless otherwise documented below in the visit note. 

## 2016-10-13 NOTE — Assessment & Plan Note (Signed)
Avoid offending foods, start probiotics. Do not eat large meals in late evening and consider raising head of bed.  

## 2016-10-13 NOTE — Assessment & Plan Note (Signed)
Is noting an increase in respiratory symptoms again today. Start Augmentin, given shot of Depo Medrol and will start a medrol dosepak tomorrow. Encouraged Mucinex and probiotics. Encouraged increased rest and hydration, add probiotics, zinc such as Coldeze or Xicam. Treat fevers as needed

## 2016-10-13 NOTE — Assessment & Plan Note (Signed)
Mild elevation with acute illness no change in meds today

## 2016-10-14 ENCOUNTER — Inpatient Hospital Stay: Payer: Commercial Managed Care - HMO | Admitting: Family Medicine

## 2016-11-14 ENCOUNTER — Ambulatory Visit: Payer: Medicare HMO | Admitting: Family Medicine

## 2016-11-15 ENCOUNTER — Encounter: Payer: Self-pay | Admitting: Pulmonary Disease

## 2016-11-19 ENCOUNTER — Ambulatory Visit (INDEPENDENT_AMBULATORY_CARE_PROVIDER_SITE_OTHER): Payer: Medicare HMO | Admitting: Pulmonary Disease

## 2016-11-19 ENCOUNTER — Encounter: Payer: Self-pay | Admitting: Pulmonary Disease

## 2016-11-19 DIAGNOSIS — J449 Chronic obstructive pulmonary disease, unspecified: Secondary | ICD-10-CM | POA: Diagnosis not present

## 2016-11-19 DIAGNOSIS — J181 Lobar pneumonia, unspecified organism: Secondary | ICD-10-CM | POA: Diagnosis not present

## 2016-11-19 DIAGNOSIS — J189 Pneumonia, unspecified organism: Secondary | ICD-10-CM

## 2016-11-19 MED ORDER — FLUTICASONE-UMECLIDIN-VILANT 100-62.5-25 MCG/INH IN AEPB: 1.0000 | INHALATION_SPRAY | Freq: Every day | RESPIRATORY_TRACT | 0 refills | Status: DC

## 2016-11-19 NOTE — Assessment & Plan Note (Signed)
Trial of  trelegy    INSTEAD of Symbicort and Spiriva

## 2016-11-19 NOTE — Assessment & Plan Note (Signed)
Pneumonia seems to have cleared on your chest x-ray Decrease guaifenesin to once in the morning and take in the afternoon if needed only

## 2016-11-19 NOTE — Progress Notes (Signed)
Patient seen in the office today and instructed on use of Trelegy.  Patient expressed understanding and demonstrated technique. Benetta Spar [facility] 11/19/16

## 2016-11-19 NOTE — Patient Instructions (Signed)
Pneumonia seems to have cleared on your chest x-ray Decrease guaifenesin to once in the morning and take in the afternoon if needed only  Trial of      INSTEAD of Symbicort and Spiriva

## 2016-11-19 NOTE — Progress Notes (Signed)
   Subjective:    Patient ID: [name], male    DOB: Jan 25, 1946, 71 y.o.   MRN: [id number]  HPI  71 yo male former smoker with COPD  (Spirometry FEV1 53%, ratio 54-2014) Lung abscess in 1996 s/p RML resection  quit smoking in 2002  11/19/2016  Chief Complaint  Patient presents with  . Follow-up    6-8 wk f/u. Was in the hospital at the end of January for pneumonia. Pt. states he is feeling much better. Still has a cough.    He was admitted 09/2016 for COPD  Exacerbation and LLL CAP . Treated with IV antibiotics, steroids, nebulized broncho-dilators. Influenza panel negative  I have reviewed the images, follow-up chest x-ray 10/07/16 shows clear evidence of infiltrate and chronic volume loss on the right  His breathing is improved, but he has a lingering cough, occasionally productive of white to yellow sputum. He takes guaifenesin twice daily Spiriva caused him extreme dryness of mouth and he has cut down to 1 puff daily. He remains on Symbicort. He wonders if there are alternatives second reduces co-pay   Past Medical History:  Diagnosis Date  . Allergic rhinitis   . Atypical chest pain 12/01/2014  . Back pain 01/12/2013  . COPD (chronic obstructive pulmonary disease) (HCC)    FeV1 64%-2007  . Emphysema   . GERD (gastroesophageal reflux disease)   . History of lung abscess    bronchiectasis with RMLandRLL ersection -1996- Dr Arlyce Dice  . Hordeolum externum (stye) 04/23/2015   Right eye  . Hypertension   . Impaired vision    glasses  . Osteoarthritis    hands and knees  . Sinusitis, acute 04/23/2015     Review of Systems neg for any significant sore throat, dysphagia, itching, sneezing, nasal congestion or excess/ purulent secretions, fever, chills, sweats, unintended wt loss, pleuritic or exertional cp, hempoptysis, orthopnea pnd or change in chronic leg swelling. Also denies presyncope, palpitations, heartburn, abdominal pain, nausea, vomiting, diarrhea or change in bowel  or urinary habits, dysuria,hematuria, rash, arthralgias, visual complaints, headache, numbness weakness or ataxia.     Objective:   Physical Exam   Gen. Pleasant, well-nourished, in no distress ENT - no lesions, no post nasal drip Neck: No JVD, no thyromegaly, no carotid bruits Lungs: no use of accessory muscles, no dullness to percussion, clear without rales or rhonchi  Cardiovascular: Rhythm regular, heart sounds  normal, no murmurs or gallops, no peripheral edema Musculoskeletal: No deformities, no cyanosis or clubbing         Assessment & Plan:

## 2016-11-25 ENCOUNTER — Telehealth: Payer: Self-pay | Admitting: Pulmonary Disease

## 2016-11-25 MED ORDER — FLUTICASONE-UMECLIDIN-VILANT 100-62.5-25 MCG/INH IN AEPB: 1.0000 | INHALATION_SPRAY | Freq: Every day | RESPIRATORY_TRACT | 3 refills | Status: DC

## 2016-11-25 NOTE — Telephone Encounter (Signed)
Pt requesting a 90 day supply of trelegy to be sent to mail order pharmacy.  This has been sent.  Nothing further needed.

## 2017-01-14 ENCOUNTER — Other Ambulatory Visit: Payer: Self-pay | Admitting: Family Medicine

## 2017-01-21 DIAGNOSIS — L720 Epidermal cyst: Secondary | ICD-10-CM | POA: Diagnosis not present

## 2017-01-21 DIAGNOSIS — L570 Actinic keratosis: Secondary | ICD-10-CM | POA: Diagnosis not present

## 2017-01-21 DIAGNOSIS — D485 Neoplasm of uncertain behavior of skin: Secondary | ICD-10-CM | POA: Diagnosis not present

## 2017-01-31 ENCOUNTER — Telehealth: Payer: Self-pay | Admitting: Pulmonary Disease

## 2017-01-31 NOTE — Telephone Encounter (Addendum)
Spoke with pt, who states he is checking on status on tier exception forms for trelegy 100MCG that were faxed over from insurance company. It does not appear that we have received these forms. I have contacted [employer], spoke with [facility] and requested that she refax forms. Will await fax. Will forward to [place] to f/u on.  Ref #[id number] [employer] contact [phone]

## 2017-02-03 NOTE — Telephone Encounter (Signed)
Received paperwork. Will work on this tomorrow when [employer] not in triage.

## 2017-02-06 NOTE — Telephone Encounter (Signed)
Finally received the correct forms. Forms have been filled out. Will fax them from the [facility] office tomorrow morning.   Left patient a message to update him on status of forms.

## 2017-02-12 ENCOUNTER — Telehealth: Payer: Self-pay | Admitting: Pulmonary Disease

## 2017-02-12 NOTE — Telephone Encounter (Signed)
Spoke with [employer] with [employer] We went over the questions needed for the PA for Trelegy. She stated that we should receive a determination if not this afternoon, tomorrow morning.

## 2017-02-17 NOTE — Telephone Encounter (Signed)
Called [employer] to check on the status of the tier exception. It was denied. [employer] will fax over a copy of the denial letter. Will await for fax.

## 2017-03-31 ENCOUNTER — Encounter: Payer: Self-pay | Admitting: Pulmonary Disease

## 2017-03-31 ENCOUNTER — Ambulatory Visit (INDEPENDENT_AMBULATORY_CARE_PROVIDER_SITE_OTHER): Payer: Medicare HMO | Admitting: Pulmonary Disease

## 2017-03-31 DIAGNOSIS — J449 Chronic obstructive pulmonary disease, unspecified: Secondary | ICD-10-CM | POA: Diagnosis not present

## 2017-03-31 DIAGNOSIS — J301 Allergic rhinitis due to pollen: Secondary | ICD-10-CM

## 2017-03-31 NOTE — Assessment & Plan Note (Signed)
Continue Flonase and Singulair

## 2017-03-31 NOTE — Assessment & Plan Note (Signed)
trelegy seems to be working well-still has prior prescription of Symbicort and Spiriva leftover- he will try to alternate these to save some money and I think that is okay since he is feeling so well

## 2017-03-31 NOTE — Progress Notes (Signed)
   Subjective:    Patient ID: [name], male    DOB: 1946/07/10, 71 y.o.   MRN: [id number]  HPI   71 yo male former smoker with COPD  (Spirometry FEV1 53%, ratio 54-2014) Lung abscess in 1996 s/p RML resection  quit smoking in 2002  Chief Complaint  Patient presents with  . Follow-up    Pt states that he has been doing better since last visit. Still has SOB, prod. cough w/ clear to white mucus, and occ. CP; no difference than before.   He was treated for pneumonia left lower lobe and 09/2016 and follow-up chest x-ray showed resolution. On his last visit he was switched from Symbicort and Spiriva to trelegy - he states today that he is breathing the best and many years. He quit smoking in 2002 and did not qualify for the lung cancer screening program  No side effects from the change in medications. He denies any interim hospitalizations or ER visits  Past Medical History:  Diagnosis Date  . Allergic rhinitis   . Atypical chest pain 12/01/2014  . Back pain 01/12/2013  . COPD (chronic obstructive pulmonary disease) (HCC)    FeV1 64%-2007  . Emphysema   . GERD (gastroesophageal reflux disease)   . History of lung abscess    bronchiectasis with RMLandRLL ersection -1996- Dr Arlyce Dice  . Hordeolum externum (stye) 04/23/2015   Right eye  . Hypertension   . Impaired vision    glasses  . Osteoarthritis    hands and knees  . Sinusitis, acute 04/23/2015     Review of Systems Patient denies significant dyspnea,cough, hemoptysis,  chest pain, palpitations, pedal edema, orthopnea, paroxysmal nocturnal dyspnea, lightheadedness, nausea, vomiting, abdominal or  leg pains      Objective:   Physical Exam   Gen. Pleasant, well-nourished, in no distress ENT - no thrush, no post nasal drip Neck: No JVD, no thyromegaly, no carotid bruits Lungs: no use of accessory muscles, no dullness to percussion, clear without rales or rhonchi  Cardiovascular: Rhythm regular, heart sounds  normal,  no murmurs or gallops, no peripheral edema Musculoskeletal: No deformities, no cyanosis or clubbing         Assessment & Plan:

## 2017-03-31 NOTE — Patient Instructions (Signed)
Stay on Trelegy - this seems to be working well

## 2017-05-13 ENCOUNTER — Other Ambulatory Visit: Payer: Self-pay | Admitting: Family Medicine

## 2017-07-14 ENCOUNTER — Telehealth: Payer: Self-pay | Admitting: Family Medicine

## 2017-07-14 DIAGNOSIS — M549 Dorsalgia, unspecified: Principal | ICD-10-CM

## 2017-07-14 DIAGNOSIS — G8929 Other chronic pain: Secondary | ICD-10-CM

## 2017-07-14 NOTE — Telephone Encounter (Signed)
[facility] requires new referral before appt can be made. Recurring Back pain. Dr Gladstone Lighter at [place] needs new referral before they can schedule per pt.

## 2017-07-14 NOTE — Telephone Encounter (Signed)
Please advise in PCP absents

## 2017-07-28 DIAGNOSIS — G8929 Other chronic pain: Secondary | ICD-10-CM | POA: Diagnosis not present

## 2017-07-28 DIAGNOSIS — M545 Low back pain: Secondary | ICD-10-CM | POA: Diagnosis not present

## 2017-07-28 DIAGNOSIS — M25512 Pain in left shoulder: Secondary | ICD-10-CM | POA: Diagnosis not present

## 2017-08-09 ENCOUNTER — Other Ambulatory Visit: Payer: Self-pay | Admitting: Family Medicine

## 2017-08-29 NOTE — Progress Notes (Signed)
Cardiology Office Note    Date:  09/08/2017   ID:  [name], DOB [birth year], MRN [id number]  PCP:  Ruben Lukes, MD  Cardiologist:  Dr. Johnsie Cancel   CC: post hospital follow up  History of Present Illness:  [name] is a 71 y.o. male with a history of COPD, lung abscess in 1996 s/p RML resection, HTN and PVCs who presents to clinic for f/u   He has a history of atypical chest pain in the setting of a URI dating back to 2014. 2D ECHO in 2014 and 2015 showed normal LV function and no valvular abnormalities. PVCs are felt to be related to lung disease.    He was admitted 1/28-1/29/18 for COPD exacerbation and CAP. Treated with IV antibiotics, steroids, nebulized broncho-dilators. Influenza panel negative. CXR 10/07/16 was clear.  No CP or SOB. No LE edema, orthopnea or PND. No dizziness or syncope. No blood in stool or urine. Has chronic palpitations.   Still very active Kayaking and paddling every week    Past Medical History:  Diagnosis Date  . Allergic rhinitis   . Atypical chest pain 12/01/2014  . Back pain 01/12/2013  . COPD (chronic obstructive pulmonary disease) (HCC)    FeV1 64%-2007  . Emphysema   . GERD (gastroesophageal reflux disease)   . History of lung abscess    bronchiectasis with RMLandRLL ersection -1996- Dr Arlyce Dice  . Hordeolum externum (stye) 04/23/2015   Right eye  . Hypertension   . Impaired vision    glasses  . Osteoarthritis    hands and knees  . Sinusitis, acute 04/23/2015    Past Surgical History:  Procedure Laterality Date  . HERNIA REPAIR  10/2009  . left knee    . lower back surgery  09/2008   06/2009  . LUNG SURGERY     RML andRUL removed due to bleeding and bronchiectasis and lung abcess  . NECK SURGERY  05/2008  . right foot surgery      Current Medications: Outpatient Medications Prior to Visit  Medication Sig Dispense Refill  . albuterol (PROVENTIL HFA;VENTOLIN HFA) 108 (90 BASE) MCG/ACT inhaler Inhale 2 puffs into the  lungs every 6 (six) hours as needed for wheezing or shortness of breath. 3 Inhaler 3  . amLODipine (NORVASC) 5 MG tablet TAKE 1 TABLET EVERY DAY 90 tablet 2  . aspirin-acetaminophen-caffeine (EXCEDRIN MIGRAINE) 782-423-53 MG per tablet Take 2 tablets by mouth every 6 (six) hours as needed (arthritis pain).     . calcium elemental as carbonate (TUMS ULTRA 1000) 400 MG chewable tablet Chew 1,000 mg by mouth 2 (two) times daily.    . Capsaicin-Menthol (SALONPAS GEL EX) Apply 1 application topically daily as needed (arthritis pain).    . fluticasone (FLONASE) 50 MCG/ACT nasal spray USE 2 SPRAYS IN EACH NOSTRIL ONE TIME DAILY 48 g 1  . Fluticasone-Umeclidin-Vilant (TRELEGY ELLIPTA) 100-62.5-25 MCG/INH AEPB Inhale 1 puff into the lungs daily. 180 each 3  . Glucosamine-Chondroitin 750-600 MG TABS Take 1 tablet by mouth 2 (two) times daily.    . Guaifenesin (MUCINEX MAXIMUM STRENGTH) 1200 MG TB12 Take 600 mg by mouth 2 (two) times daily.    Marland Kitchen loratadine (CLARITIN) 10 MG tablet Take 10 mg by mouth daily.    . meloxicam (MOBIC) 15 MG tablet TAKE 1 TABLET DAILY WITH FOOD AS NEEDED FOR PAIN 90 tablet 1  . Menthol (ROBITUSSIN COUGH DROPS MT) Use as directed 1 lozenge in the mouth or throat 4 (  four) times daily as needed (cough).     . Menthol, Topical Analgesic, (BLUE-EMU MAXIMUM STRENGTH EX) Apply 1 application topically daily as needed (arthritis pain).    . montelukast (SINGULAIR) 10 MG tablet TAKE 1 TABLET EVERY DAY 90 tablet 3  . Multiple Vitamin (MULTIVITAMIN WITH MINERALS) TABS tablet Take 1 tablet by mouth on Monday, Wednesday, Friday with supper     . Neomy-Bacit-Polymyx-Pramoxine (TRIPLE ANTIBIOTIC PAIN RELIEF EX) Apply 1 application topically daily as needed (wound care).     Facility-Administered Medications Prior to Visit  Medication Dose Route Frequency Provider Last Rate Last Dose  . methylPREDNISolone acetate (DEPO-MEDROL) injection 20 mg  20 mg Intramuscular Once Ruben Lukes, MD          Allergies:   Patient has no known allergies.   Social History   Socioeconomic History  . Marital status: Married    Spouse name: None  . Number of children: None  . Years of education: None  . Highest education level: None  Social Needs  . Financial resource strain: None  . Food insecurity - worry: None  . Food insecurity - inability: None  . Transportation needs - medical: None  . Transportation needs - non-medical: None  Occupational History  . Occupation: retired Music therapist  Tobacco Use  . Smoking status: Former Smoker    Packs/day: 1.50    Years: 38.00    Pack years: 57.00    Types: Cigarettes    Last attempt to quit: 09/03/2000    Years since quitting: 17.0  . Smokeless tobacco: Never Used  Substance and Sexual Activity  . Alcohol use: Yes    Alcohol/week: 1.2 - 1.8 oz    Types: 2 - 3 Cans of beer per week  . Drug use: No  . Sexual activity: No  Other Topics Concern  . None  Social History Narrative   Retired Music therapist   Patient states former smoker. 1 1/2 ppd x 38 yrs  Quit in Jan 2002   Married - 2 weeks (4th marriage)   divorced,  remarried 84-2004 (lost wife to lung ca),  remarried (divorced),    1 son  - 31 [name])   Alcohol use-yes (2-3 beers per week)             Family History:  The patient's family history includes Colon cancer in his paternal grandmother; Coronary artery disease in his unknown relative; Heart failure in his mother; Obesity in his brother; Obstructive Sleep Apnea in his brother; Prostate cancer in his father; Stroke in his unknown relative.      ROS:   Please see the history of present illness.    ROS All other systems reviewed and are negative.   PHYSICAL EXAM:   VS:  BP 140/76   Pulse 87   Ht 5' 7.5" (1.715 m)   Wt 149 lb 12 oz (67.9 kg)   SpO2 94%   BMI 23.11 kg/m    Affect appropriate Elderly male COPD  HEENT: normal Neck supple with no adenopathy JVP normal no bruits no thyromegaly Lungs  mild end expiratory  wheezing and good diaphragmatic motion Heart:  S1/S2 no murmur, no rub, gallop or click PMI normal Abdomen: benighn, BS positve, no tenderness, no AAA no bruit.  No HSM or HJR Distal pulses intact with no bruits No edema Neuro non-focal Skin warm and dry No muscular weakness     Wt Readings from Last 3 Encounters:  09/08/17 149 lb 12  oz (67.9 kg)  03/31/17 145 lb 6.4 oz (66 kg)  11/19/16 149 lb (67.6 kg)      Studies/Labs Reviewed:   EKG:   09/28/16 SR rate 95 PVC;s normal ST segments  09/08/17 SR rate 82 PVC ICRBBB no acute changes   Recent Labs: 09/10/2016: TSH 0.42 09/30/2016: Magnesium 2.0 10/11/2016: ALT 22; BUN 26; Creatinine, Ser 0.78; Hemoglobin 14.4; Platelets 334.0; Potassium 4.1; Sodium 137   Lipid Panel    Component Value Date/Time   CHOL 181 09/10/2016 1428   TRIG 144.0 09/10/2016 1428   HDL 61.30 09/10/2016 1428   CHOLHDL 3 09/10/2016 1428   VLDL 28.8 09/10/2016 1428   LDLCALC 91 09/10/2016 1428   LDLDIRECT 85 07/22/2014 1031    Additional studies/ records that were reviewed today include:  2D ECHO: 01/03/2015 LV EF: 55% - 60% Study Conclusions - Left ventricle: The cavity size was normal. Wall thickness was normal. Systolic function was normal. The estimated ejection fraction was in the range of 55% to 60%. Wall motion was normal; there were no regional wall motion abnormalities. Doppler parameters are consistent with abnormal left ventricular relaxation (grade 1 diastolic dysfunction). The E/e&' ratio is between 8-15, suggseting indeterminate LV Filling pressure. - Left atrium: The atrium was normal in size. - Right atrium: The atrium was at the upper limits of normal in size. - Inferior vena cava: The vessel was normal in size. The respirophasic diameter changes were in the normal range (>= 50%), consistent with normal central venous pressure. Impressions: - Compared to a prior echo in 2014, there do not  appear to be any significant changes   ASSESSMENT & PLAN:   HTN: Well controlled.  Continue current medications and low sodium Dash type diet.    PVCs: normal EF no documented CAD continue beta blocker   COPD: stable now.continue claritin flonase and inhalers Should have CXR this month with primary Otho Darner

## 2017-09-08 ENCOUNTER — Ambulatory Visit: Payer: Medicare HMO | Admitting: Cardiovascular Disease

## 2017-09-08 ENCOUNTER — Encounter: Payer: Self-pay | Admitting: Cardiovascular Disease

## 2017-09-08 VITALS — BP 140/76 | HR 87 | Ht 67.5 in | Wt 149.8 lb

## 2017-09-08 DIAGNOSIS — I2510 Atherosclerotic heart disease of native coronary artery without angina pectoris: Secondary | ICD-10-CM | POA: Diagnosis not present

## 2017-09-08 NOTE — Patient Instructions (Addendum)

## 2017-09-15 ENCOUNTER — Ambulatory Visit (INDEPENDENT_AMBULATORY_CARE_PROVIDER_SITE_OTHER): Payer: Medicare HMO | Admitting: Family Medicine

## 2017-09-15 ENCOUNTER — Encounter: Payer: Self-pay | Admitting: Family Medicine

## 2017-09-15 VITALS — BP 136/70 | HR 89 | Temp 97.9°F | Resp 18 | Ht 68.0 in | Wt 148.6 lb

## 2017-09-15 DIAGNOSIS — R351 Nocturia: Secondary | ICD-10-CM | POA: Diagnosis not present

## 2017-09-15 DIAGNOSIS — Z87891 Personal history of nicotine dependence: Secondary | ICD-10-CM

## 2017-09-15 DIAGNOSIS — R739 Hyperglycemia, unspecified: Secondary | ICD-10-CM | POA: Diagnosis not present

## 2017-09-15 DIAGNOSIS — Z125 Encounter for screening for malignant neoplasm of prostate: Secondary | ICD-10-CM | POA: Diagnosis not present

## 2017-09-15 DIAGNOSIS — I10 Essential (primary) hypertension: Secondary | ICD-10-CM

## 2017-09-15 DIAGNOSIS — L989 Disorder of the skin and subcutaneous tissue, unspecified: Secondary | ICD-10-CM | POA: Insufficient documentation

## 2017-09-15 DIAGNOSIS — M25551 Pain in right hip: Secondary | ICD-10-CM | POA: Diagnosis not present

## 2017-09-15 DIAGNOSIS — M25511 Pain in right shoulder: Secondary | ICD-10-CM | POA: Insufficient documentation

## 2017-09-15 DIAGNOSIS — Z0000 Encounter for general adult medical examination without abnormal findings: Secondary | ICD-10-CM

## 2017-09-15 DIAGNOSIS — D649 Anemia, unspecified: Secondary | ICD-10-CM

## 2017-09-15 DIAGNOSIS — M25562 Pain in left knee: Secondary | ICD-10-CM | POA: Insufficient documentation

## 2017-09-15 DIAGNOSIS — J209 Acute bronchitis, unspecified: Secondary | ICD-10-CM

## 2017-09-15 DIAGNOSIS — G8929 Other chronic pain: Secondary | ICD-10-CM

## 2017-09-15 DIAGNOSIS — J441 Chronic obstructive pulmonary disease with (acute) exacerbation: Secondary | ICD-10-CM

## 2017-09-15 LAB — CBC
HCT: 41.8 % (ref 39.0–52.0)
Hemoglobin: 14 g/dL (ref 13.0–17.0)
MCHC: 33.4 g/dL (ref 30.0–36.0)
MCV: 92.4 fl (ref 78.0–100.0)
Platelets: 340 10*3/uL (ref 150.0–400.0)
RBC: 4.52 Mil/uL (ref 4.22–5.81)
RDW: 13.2 % (ref 11.5–15.5)
WBC: 7.5 10*3/uL (ref 4.0–10.5)

## 2017-09-15 LAB — COMPREHENSIVE METABOLIC PANEL
ALT: 17 U/L (ref 0–53)
AST: 20 U/L (ref 0–37)
Albumin: 3.9 g/dL (ref 3.5–5.2)
Alkaline Phosphatase: 78 U/L (ref 39–117)
BUN: 24 mg/dL — ABNORMAL HIGH (ref 6–23)
CO2: 29 mEq/L (ref 19–32)
Calcium: 9.4 mg/dL (ref 8.4–10.5)
Chloride: 103 mEq/L (ref 96–112)
Creatinine, Ser: 0.75 mg/dL (ref 0.40–1.50)
GFR: 109.16 mL/min (ref >60.00)
Glucose, Bld: 79 mg/dL (ref 70–99)
Potassium: 4.6 mEq/L (ref 3.5–5.1)
Sodium: 138 mEq/L (ref 135–145)
Total Bilirubin: 0.5 mg/dL (ref 0.2–1.2)
Total Protein: 7.2 g/dL (ref 6.0–8.3)

## 2017-09-15 LAB — HEMOGLOBIN A1C: Hgb A1c MFr Bld: 5.9 % (ref 4.6–6.5)

## 2017-09-15 LAB — PSA: PSA: 2.68 ng/mL (ref 0.10–4.00)

## 2017-09-15 LAB — TSH: TSH: 0.72 u[IU]/mL (ref 0.35–4.50)

## 2017-09-15 MED ORDER — CEFDINIR 300 MG PO CAPS: 300.0000 mg | ORAL_CAPSULE | Freq: Two times a day (BID) | ORAL | 0 refills | Status: AC

## 2017-09-15 MED ORDER — AMLODIPINE BESYLATE 5 MG PO TABS: 5.0000 mg | ORAL_TABLET | Freq: Every day | ORAL | 2 refills | Status: DC

## 2017-09-15 MED ORDER — PREDNISONE 10 MG PO TABS: ORAL_TABLET | ORAL | 0 refills | Status: DC

## 2017-09-15 MED ORDER — ALBUTEROL SULFATE HFA 108 (90 BASE) MCG/ACT IN AERS: 2.0000 | INHALATION_SPRAY | Freq: Four times a day (QID) | RESPIRATORY_TRACT | 3 refills | Status: DC | PRN

## 2017-09-15 MED ORDER — MONTELUKAST SODIUM 10 MG PO TABS: 10.0000 mg | ORAL_TABLET | Freq: Every day | ORAL | 3 refills | Status: DC

## 2017-09-15 MED ORDER — FLUTICASONE PROPIONATE 50 MCG/ACT NA SUSP: NASAL | 1 refills | Status: DC

## 2017-09-15 MED ORDER — MELOXICAM 15 MG PO TABS: ORAL_TABLET | ORAL | 1 refills | Status: DC

## 2017-09-15 MED FILL — CEFDINIR 300 MG CAPSULE: 300 | 10 days supply | Qty: 20 | Fill #0

## 2017-09-15 MED FILL — predniSONE 10 MG TABS: 10 | 15 days supply | Qty: 32 | Fill #0

## 2017-09-15 NOTE — Assessment & Plan Note (Signed)
Well controlled, no changes to meds. Encouraged heart healthy diet such as the DASH diet and exercise as tolerated.  °

## 2017-09-15 NOTE — Assessment & Plan Note (Signed)
Has been seeing dermatology. Has some new lesions. Has been treated with Imiquimod for recurrent precancerous lesions

## 2017-09-15 NOTE — Assessment & Plan Note (Signed)
R.>L for years, no recent injury or decreased ROM

## 2017-09-15 NOTE — Progress Notes (Signed)
Subjective:  I acted as a Education administrator for Dr. Charlett Blake. [place], [place]  Patient ID: [name], male    DOB: April 17, 1946, 72 y.o.   MRN: [id number]  No chief complaint on file.   HPI  Patient is in today for an annual exam and follow up on chronic medical concerns including copd, hyperglycemia, hypertension and more. He has been feeling poorly since christmas initially with fevers and malaise that has improved but head and chest congestion persist. Cough is often productive of brown phlegm. SOB with exertion is noted. Denies sore throat most of the time but when does occur Chloraseptic helps. He feels the diesel fumes on the highway flared him. He is doing well with his ADLs trying to stay active and maintain a heart healthy diet. Denies CP/palp/HA/fevers/GI or GU c/o. Taking meds as prescribed  Patient Care Team: Mosie Lukes, MD as PCP - General (Family Medicine) Josue Hector, MD as PCP - Cardiology (Cardiology) Elsie Stain, MD as Attending Physician (Pulmonary Disease)   Past Medical History:  Diagnosis Date  . Allergic rhinitis   . Atypical chest pain 12/01/2014  . Back pain 01/12/2013  . COPD (chronic obstructive pulmonary disease) (HCC)    FeV1 64%-2007  . Emphysema   . GERD (gastroesophageal reflux disease)   . History of lung abscess    bronchiectasis with RMLandRLL ersection -1996- Dr Arlyce Dice  . Hordeolum externum (stye) 04/23/2015   Right eye  . Hypertension   . Impaired vision    glasses  . Osteoarthritis    hands and knees  . Sinusitis, acute 04/23/2015    Past Surgical History:  Procedure Laterality Date  . HERNIA REPAIR  10/2009  . left knee    . lower back surgery  09/2008   06/2009  . LUNG SURGERY     RML andRUL removed due to bleeding and bronchiectasis and lung abcess  . NECK SURGERY  05/2008  . right foot surgery      Family History  Problem Relation Age of Onset  . Prostate cancer Father        father died prostate ca  . Coronary artery disease  Unknown        1st degree relative<60  . Stroke Unknown        1st degree relative<50  . Heart failure Mother        age 67  . Colon cancer Paternal Grandmother   . Obstructive Sleep Apnea Brother   . Obesity Brother     Social History   Socioeconomic History  . Marital status: Married    Spouse name: Not on file  . Number of children: Not on file  . Years of education: Not on file  . Highest education level: Not on file  Social Needs  . Financial resource strain: Not on file  . Food insecurity - worry: Not on file  . Food insecurity - inability: Not on file  . Transportation needs - medical: Not on file  . Transportation needs - non-medical: Not on file  Occupational History  . Occupation: retired Music therapist  Tobacco Use  . Smoking status: Former Smoker    Packs/day: 1.50    Years: 38.00    Pack years: 57.00    Types: Cigarettes    Last attempt to quit: 09/03/2000    Years since quitting: 17.0  . Smokeless tobacco: Never Used  Substance and Sexual Activity  . Alcohol use: Yes    Alcohol/week: 1.2 -  1.8 oz    Types: 2 - 3 Cans of beer per week  . Drug use: No  . Sexual activity: No  Other Topics Concern  . Not on file  Social History Narrative   Retired Music therapist   Patient states former smoker. 1 1/2 ppd x 38 yrs  Quit in Jan 2002   Married - 2 weeks (4th marriage)   divorced,  remarried 84-2004 (lost wife to lung ca),  remarried (divorced),    1 son  - 38 [name])   Alcohol use-yes (2-3 beers per week)            Outpatient Medications Prior to Visit  Medication Sig Dispense Refill  . aspirin-acetaminophen-caffeine (EXCEDRIN MIGRAINE) 250-250-65 MG per tablet Take 2 tablets by mouth every 6 (six) hours as needed (arthritis pain).     . calcium elemental as carbonate (TUMS ULTRA 1000) 400 MG chewable tablet Chew 1,000 mg by mouth 2 (two) times daily.    . Capsaicin-Menthol (SALONPAS GEL EX) Apply 1 application topically daily as needed  (arthritis pain).    . Fluticasone-Umeclidin-Vilant (TRELEGY ELLIPTA) 100-62.5-25 MCG/INH AEPB Inhale 1 puff into the lungs daily. 180 each 3  . Glucosamine-Chondroitin 750-600 MG TABS Take 1 tablet by mouth 2 (two) times daily.    . Guaifenesin (MUCINEX MAXIMUM STRENGTH) 1200 MG TB12 Take 600 mg by mouth 2 (two) times daily.    Marland Kitchen loratadine (CLARITIN) 10 MG tablet Take 10 mg by mouth daily.    . Menthol (ROBITUSSIN COUGH DROPS MT) Use as directed 1 lozenge in the mouth or throat 4 (four) times daily as needed (cough).     . Menthol, Topical Analgesic, (BLUE-EMU MAXIMUM STRENGTH EX) Apply 1 application topically daily as needed (arthritis pain).    . Multiple Vitamin (MULTIVITAMIN WITH MINERALS) TABS tablet Take 1 tablet by mouth on Monday, Wednesday, Friday with supper     . Neomy-Bacit-Polymyx-Pramoxine (TRIPLE ANTIBIOTIC PAIN RELIEF EX) Apply 1 application topically daily as needed (wound care).    Marland Kitchen albuterol (PROVENTIL HFA;VENTOLIN HFA) 108 (90 BASE) MCG/ACT inhaler Inhale 2 puffs into the lungs every 6 (six) hours as needed for wheezing or shortness of breath. 3 Inhaler 3  . amLODipine (NORVASC) 5 MG tablet TAKE 1 TABLET EVERY DAY 90 tablet 2  . fluticasone (FLONASE) 50 MCG/ACT nasal spray USE 2 SPRAYS IN EACH NOSTRIL ONE TIME DAILY 48 g 1  . meloxicam (MOBIC) 15 MG tablet TAKE 1 TABLET DAILY WITH FOOD AS NEEDED FOR PAIN 90 tablet 1  . montelukast (SINGULAIR) 10 MG tablet TAKE 1 TABLET EVERY DAY 90 tablet 3  . methylPREDNISolone acetate (DEPO-MEDROL) injection 20 mg      No facility-administered medications prior to visit.     No Known Allergies  Review of Systems  Constitutional: Positive for fever. Negative for chills and malaise/fatigue.  HENT: Positive for congestion. Negative for hearing loss.   Eyes: Negative for discharge.  Respiratory: Positive for cough, sputum production and shortness of breath.   Cardiovascular: Negative for chest pain, palpitations and leg swelling.    Gastrointestinal: Negative for abdominal pain, blood in stool, constipation, diarrhea, heartburn, nausea and vomiting.  Genitourinary: Negative for dysuria, frequency, hematuria and urgency.  Musculoskeletal: Negative for back pain, falls and myalgias.  Skin: Negative for rash.  Neurological: Negative for dizziness, sensory change, loss of consciousness, weakness and headaches.  Endo/Heme/Allergies: Negative for environmental allergies. Does not bruise/bleed easily.  Psychiatric/Behavioral: Negative for depression and suicidal ideas. The patient is not  nervous/anxious and does not have insomnia.        Objective:    Physical Exam  Constitutional: He is oriented to person, place, and time. He appears well-developed and well-nourished. No distress.  HENT:  Head: Normocephalic and atraumatic.  Eyes: Conjunctivae are normal.  Neck: Neck supple. No thyromegaly present.  Cardiovascular: Normal rate, regular rhythm and normal heart sounds.  No murmur heard. Pulmonary/Chest: Effort normal and breath sounds normal. No respiratory distress. He has no wheezes.  Abdominal: Soft. Bowel sounds are normal. He exhibits no mass. There is no tenderness.  Musculoskeletal: He exhibits no edema.  Lymphadenopathy:    He has no cervical adenopathy.  Neurological: He is alert and oriented to person, place, and time.  Skin: Skin is warm and dry.  Psychiatric: He has a normal mood and affect. His behavior is normal.    BP 136/70 (BP Location: Left Arm, Patient Position: Sitting, Cuff Size: Normal)   Pulse 89   Temp 97.9 F (36.6 C) (Oral)   Resp 18   Ht 5\' 8"  (1.727 m)   Wt 148 lb 9.6 oz (67.4 kg)   SpO2 96%   BMI 22.59 kg/m  Wt Readings from Last 3 Encounters:  09/15/17 148 lb 9.6 oz (67.4 kg)  09/08/17 149 lb 12 oz (67.9 kg)  03/31/17 145 lb 6.4 oz (66 kg)   BP Readings from Last 3 Encounters:  09/15/17 136/70  09/08/17 140/76  03/31/17 118/64     Immunization History  Administered  Date(s) Administered  . Influenza Split 06/02/2012  . Influenza Whole 05/15/2010, 05/02/2011, 05/18/2013  . Influenza, High Dose Seasonal PF 04/20/2017  . Influenza,inj,Quad PF,6+ Mos 05/14/2014, 05/25/2015  . Influenza-Unspecified 05/15/2016  . Pneumococcal Conjugate-13 05/20/2013, 05/15/2016  . Pneumococcal Polysaccharide-23 07/03/2001, 05/02/2011  . Td 05/15/2004  . Tdap 02/17/2014  . Zoster 07/16/2011    Health Maintenance  Topic Date Due  . PNA vac Low Risk Adult (2 of 2 - PPSV23) 05/15/2017  . COLONOSCOPY  11/19/2020  . TETANUS/TDAP  02/18/2024  . INFLUENZA VACCINE  Completed  . Hepatitis C Screening  Completed    Lab Results  Component Value Date   WBC 9.2 10/11/2016   HGB 14.4 10/11/2016   HCT 43.0 10/11/2016   PLT 334.0 10/11/2016   GLUCOSE 95 10/11/2016   CHOL 181 09/10/2016   TRIG 144.0 09/10/2016   HDL 61.30 09/10/2016   LDLDIRECT 85 07/22/2014   LDLCALC 91 09/10/2016   ALT 22 10/11/2016   AST 22 10/11/2016   NA 137 10/11/2016   K 4.1 10/11/2016   CL 103 10/11/2016   CREATININE 0.78 10/11/2016   BUN 26 (H) 10/11/2016   CO2 28 10/11/2016   TSH 0.42 09/10/2016   PSA 1.91 09/10/2016   INR 1.18 09/28/2016   HGBA1C 5.7 07/22/2014    Lab Results  Component Value Date   TSH 0.42 09/10/2016   Lab Results  Component Value Date   WBC 9.2 10/11/2016   HGB 14.4 10/11/2016   HCT 43.0 10/11/2016   MCV 94.7 10/11/2016   PLT 334.0 10/11/2016   Lab Results  Component Value Date   NA 137 10/11/2016   K 4.1 10/11/2016   CO2 28 10/11/2016   GLUCOSE 95 10/11/2016   BUN 26 (H) 10/11/2016   CREATININE 0.78 10/11/2016   BILITOT 0.8 10/11/2016   ALKPHOS 64 10/11/2016   AST 22 10/11/2016   ALT 22 10/11/2016   PROT 6.8 10/11/2016   ALBUMIN 3.9 10/11/2016   CALCIUM  9.3 10/11/2016   ANIONGAP 6 09/30/2016   GFR 104.61 10/11/2016   Lab Results  Component Value Date   CHOL 181 09/10/2016   Lab Results  Component Value Date   HDL 61.30 09/10/2016   Lab  Results  Component Value Date   LDLCALC 91 09/10/2016   Lab Results  Component Value Date   TRIG 144.0 09/10/2016   Lab Results  Component Value Date   CHOLHDL 3 09/10/2016   Lab Results  Component Value Date   HGBA1C 5.7 07/22/2014         Assessment & Plan:   Problem List Items Addressed This Visit    Benign essential HTN    Well controlled, no changes to meds. Encouraged heart healthy diet such as the DASH diet and exercise as tolerated.      Relevant Medications   amLODipine (NORVASC) 5 MG tablet   Other Relevant Orders   CBC   Comprehensive metabolic panel   TSH   Prostate cancer screening    Father and paternal uncle with in 86s and another paternal uncle in 23s with prostate cancer. Will continue to check PSA annually per patient request.       Relevant Orders   PSA   Preventative health care    Patient encouraged to maintain heart healthy diet, regular exercise, adequate sleep. Consider daily probiotics. Take medications as prescribed. Given and reviewed copy of ACP documents from [place] and encouraged to complete and return      COPD exacerbation ([place])    Flared for several weeks now. Will start on Prednisone taper, Cefdinir, Mucinex and probiotics.      Relevant Medications   albuterol (PROVENTIL HFA;VENTOLIN HFA) 108 (90 Base) MCG/ACT inhaler   fluticasone (FLONASE) 50 MCG/ACT nasal spray   montelukast (SINGULAIR) 10 MG tablet   predniSONE (DELTASONE) 10 MG tablet   Anemia   Relevant Orders   CBC   Left knee pain    Flared recently worse with weather changes.      Right hip pain    Worse over past 6 months. Had a fall last spring when opening/closing the garage door, worse with weather.       Relevant Orders   Ambulatory referral to Sports Medicine   Shoulder pain, right    R.>L for years, no recent injury or decreased ROM      Relevant Orders   Ambulatory referral to Sports Medicine   Nocturia    Check PSA, no  dysuria      Relevant Orders   PSA   Hyperglycemia     minimize simple carbs. Increase exercise as tolerated.      Relevant Orders   Hemoglobin A1c   Skin lesion    Has been seeing dermatology. Has some new lesions. Has been treated with Imiquimod for recurrent precancerous lesions      H/O tobacco use, presenting hazards to health - Primary    38 years of 1.5 PPD quit in 2002. After discussion is referred for low dose CT scan surveillance      Relevant Orders   Ambulatory Referral for Lung Cancer Scre    Other Visit Diagnoses    Essential hypertension       Relevant Medications   amLODipine (NORVASC) 5 MG tablet   cefdinir (OMNICEF) 300 MG capsule   Acute bronchitis, unspecified organism       Relevant Medications   cefdinir (OMNICEF) 300 MG capsule      I  have changed [name]'s amLODipine, montelukast, cefdinir, and predniSONE. I am also having him maintain his aspirin-acetaminophen-caffeine, loratadine, Menthol (ROBITUSSIN COUGH DROPS MT), Guaifenesin, Glucosamine-Chondroitin, calcium elemental as carbonate, multivitamin with minerals, Capsaicin-Menthol (SALONPAS GEL EX), (Menthol, Topical Analgesic, (BLUE-EMU MAXIMUM STRENGTH EX)), Neomy-Bacit-Polymyx-Pramoxine (TRIPLE ANTIBIOTIC PAIN RELIEF EX), Fluticasone-Umeclidin-Vilant, albuterol, fluticasone, and meloxicam. We will stop administering methylPREDNISolone acetate.  Meds ordered this encounter  Medications  . albuterol (PROVENTIL HFA;VENTOLIN HFA) 108 (90 Base) MCG/ACT inhaler    Sig: Inhale 2 puffs into the lungs every 6 (six) hours as needed for wheezing or shortness of breath.    Dispense:  3 Inhaler    Refill:  3  . amLODipine (NORVASC) 5 MG tablet    Sig: Take 1 tablet (5 mg total) by mouth daily.    Dispense:  90 tablet    Refill:  2  . fluticasone (FLONASE) 50 MCG/ACT nasal spray    Sig: USE 2 SPRAYS IN EACH NOSTRIL ONE TIME DAILY    Dispense:  48 g    Refill:  1  . meloxicam (MOBIC) 15 MG  tablet    Sig: TAKE 1 TABLET DAILY WITH FOOD AS NEEDED FOR PAIN    Dispense:  90 tablet    Refill:  1  . montelukast (SINGULAIR) 10 MG tablet    Sig: Take 1 tablet (10 mg total) by mouth daily.    Dispense:  90 tablet    Refill:  3  . cefdinir (OMNICEF) 300 MG capsule    Sig: Take 1 capsule (300 mg total) by mouth 2 (two) times daily for 10 days.    Dispense:  20 capsule    Refill:  0  . predniSONE (DELTASONE) 10 MG tablet    Sig: Take 4 tablets by mouth daily x 3 days, Then 3 tablets x 3 days, Then 2 tablets x 3 days, then 1 tablet x 3 days then 1/2 tab po qd x 3 days    Dispense:  22 tablet    Refill:  0    CMA served as scribe during this visit. History, Physical and Plan performed by medical provider. Documentation and orders reviewed and attested to.  Penni Homans, MD

## 2017-09-15 NOTE — Assessment & Plan Note (Signed)
38 years of 1.5 PPD quit in 2002. After discussion is referred for low dose CT scan surveillance

## 2017-09-15 NOTE — Assessment & Plan Note (Signed)
Flared recently worse with weather changes.

## 2017-09-15 NOTE — Patient Instructions (Signed)
Shingrix is the new shingles shot, 2 shots over 2-6 month window, at [place] 65 Years and Older, Male Preventive care refers to lifestyle choices and visits with your health care provider that can promote health and wellness. What does preventive care include?  A yearly physical exam. This is also called an annual well check.  Dental exams once or twice a year.  Routine eye exams. Ask your health care provider how often you should have your eyes checked.  Personal lifestyle choices, including: ? Daily care of your teeth and gums. ? Regular physical activity. ? Eating a healthy diet. ? Avoiding tobacco and drug use. ? Limiting alcohol use. ? Practicing safe sex. ? Taking low doses of aspirin every day. ? Taking vitamin and mineral supplements as recommended by your health care provider. What happens during an annual well check? The services and screenings done by your health care provider during your annual well check will depend on your age, overall health, lifestyle risk factors, and family history of disease. Counseling Your health care provider may ask you questions about your:  Alcohol use.  Tobacco use.  Drug use.  Emotional well-being.  Home and relationship well-being.  Sexual activity.  Eating habits.  History of falls.  Memory and ability to understand (cognition).  Work and work Statistician.  Screening You may have the following tests or measurements:  Height, weight, and BMI.  Blood pressure.  Lipid and cholesterol levels. These may be checked every 5 years, or more frequently if you are over 87 years old.  Skin check.  Lung cancer screening. You may have this screening every year starting at age 42 if you have a 30-pack-year history of smoking and currently smoke or have quit within the past 15 years.  Fecal occult blood test (FOBT) of the stool. You may have this test every year starting at age 59.  Flexible sigmoidoscopy or  colonoscopy. You may have a sigmoidoscopy every 5 years or a colonoscopy every 10 years starting at age 70.  Prostate cancer screening. Recommendations will vary depending on your family history and other risks.  Hepatitis C blood test.  Hepatitis B blood test.  Sexually transmitted disease (STD) testing.  Diabetes screening. This is done by checking your blood sugar (glucose) after you have not eaten for a while (fasting). You may have this done every 1-3 years.  Abdominal aortic aneurysm (AAA) screening. You may need this if you are a current or former smoker.  Osteoporosis. You may be screened starting at age 56 if you are at high risk.  Talk with your health care provider about your test results, treatment options, and if necessary, the need for more tests. Vaccines Your health care provider may recommend certain vaccines, such as:  Influenza vaccine. This is recommended every year.  Tetanus, diphtheria, and acellular pertussis (Tdap, Td) vaccine. You may need a Td booster every 10 years.  Varicella vaccine. You may need this if you have not been vaccinated.  Zoster vaccine. You may need this after age 24.  Measles, mumps, and rubella (MMR) vaccine. You may need at least one dose of MMR if you were born in 1957 or later. You may also need a second dose.  Pneumococcal 13-valent conjugate (PCV13) vaccine. One dose is recommended after age 42.  Pneumococcal polysaccharide (PPSV23) vaccine. One dose is recommended after age 56.  Meningococcal vaccine. You may need this if you have certain conditions.  Hepatitis A vaccine. You may need this if  you have certain conditions or if you travel or work in places where you may be exposed to hepatitis A.  Hepatitis B vaccine. You may need this if you have certain conditions or if you travel or work in places where you may be exposed to hepatitis B.  Haemophilus influenzae type b (Hib) vaccine. You may need this if you have certain risk  factors.  Talk to your health care provider about which screenings and vaccines you need and how often you need them. This information is not intended to replace advice given to you by your health care provider. Make sure you discuss any questions you have with your health care provider. Document Released: 09/15/2015 Document Revised: 05/08/2016 Document Reviewed: 06/20/2015 Elsevier Interactive Patient Education  [employer].

## 2017-09-15 NOTE — Assessment & Plan Note (Signed)
Worse over past 6 months. Had a fall last spring when opening/closing the garage door, worse with weather.

## 2017-09-15 NOTE — Assessment & Plan Note (Signed)
Check PSA, no dysuria

## 2017-09-15 NOTE — Assessment & Plan Note (Signed)
minimize simple carbs. Increase exercise as tolerated.  

## 2017-09-15 NOTE — Assessment & Plan Note (Addendum)
Father and paternal uncle with in 67s and another paternal uncle in 31s with prostate cancer. Will continue to check PSA annually per patient request.

## 2017-09-15 NOTE — Assessment & Plan Note (Signed)
Patient encouraged to maintain heart healthy diet, regular exercise, adequate sleep. Consider daily probiotics. Take medications as prescribed. Given and reviewed copy of ACP documents from [place] Secretary of State and encouraged to complete and return 

## 2017-09-15 NOTE — Assessment & Plan Note (Signed)
Flared for several weeks now. Will start on Prednisone taper, Cefdinir, Mucinex and probiotics.

## 2017-09-18 ENCOUNTER — Ambulatory Visit: Payer: Medicare HMO | Admitting: Family Medicine

## 2017-09-18 ENCOUNTER — Encounter: Payer: Self-pay | Admitting: Family Medicine

## 2017-09-18 DIAGNOSIS — M25511 Pain in right shoulder: Secondary | ICD-10-CM

## 2017-09-18 DIAGNOSIS — M25551 Pain in right hip: Secondary | ICD-10-CM

## 2017-09-18 DIAGNOSIS — G8929 Other chronic pain: Secondary | ICD-10-CM

## 2017-09-18 MED ORDER — METHYLPREDNISOLONE ACETATE 40 MG/ML IJ SUSP
40.0000 mg | Freq: Once | INTRAMUSCULAR | Status: AC
  Administered 2017-09-18: 40 mg via INTRA_ARTICULAR

## 2017-09-18 NOTE — Patient Instructions (Signed)
You have rotator cuff impingement and scapular dysfunction of your shoulder. Try to avoid painful activities (overhead activities, lifting with extended arm) as much as possible. Finish your prednisone then restart the meloxicam for this. Can take tylenol in addition to this. Subacromial injection may be beneficial to help with pain and to decrease inflammation though rehab is much more important in your case for long term relief. Consider physical therapy with transition to home exercise program. Do home exercise program with theraband and scapular stabilization exercises daily 3 sets of 10 once a day. If not improving at follow-up we will consider imaging, injection, physical therapy, and/or nitro patches. Follow up with me in 6 weeks (but can make appointment sooner for your knee and we'll contact your PCP to make sure being referred here for that is ok - I'm sure she'll be fine with this though).  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 Avoid painful activities as much as possible. Ice over area of pain 3-4 times a day for 15 minutes at a time Hip side raise exercise 3 sets of 10 once a day - add weights if this becomes too easy. Stretches - pick 2-3 and hold for 20-30 seconds x 3 - do once or twice a day. You were given an injection for this today. If not improving, can consider physical therapy. Follow up with me in 6 weeks.

## 2017-09-20 ENCOUNTER — Encounter: Payer: Self-pay | Admitting: Family Medicine

## 2017-09-20 NOTE — Assessment & Plan Note (Signed)
2/2 combination of snapping of tensor fascia lata, glut medius weakness, and trochanteric bursitis.  Injection given today.  Icing, shown home exercises and stretches.  Consider physical therapy.  F/u in 6 weeks.  After informed written consent timeout was performed, patient was lying on left side on exam table.  Area overlying right trochanteric bursa prepped with alcohol swab then injected with 6:2 bupivicaine: depomedrol.  Patient tolerated procedure well without immediate complications.

## 2017-09-20 NOTE — Progress Notes (Signed)
PCP and consultation requested by: Ruben Lukes, MD  Subjective:   HPI: Patient is a 72 y.o. male here for right hip and shoulder pain.  Patient reports he's had several months of lateral and posterior right hip pain. No acute injury or trauma. Pain is sharp and currently 4/10. Difficulty lying on right side. Will get a pop in this area at times. Has done some exercises for back. Taking prednisone which has helped and takes meloxicam. No radiation. No numbness or tingling. No bowel/bladder dysfunction. Also with off and on right shoulder pain for several years. Pain is anterior, 3/10 level. He is left handed. Rarely wakes him up. Bothers him with reaching.  Past Medical History:  Diagnosis Date  . Allergic rhinitis   . Atypical chest pain 12/01/2014  . Back pain 01/12/2013  . COPD (chronic obstructive pulmonary disease) (HCC)    FeV1 64%-2007  . Emphysema   . GERD (gastroesophageal reflux disease)   . History of lung abscess    bronchiectasis with RMLandRLL ersection -1996- Dr Arlyce Dice  . Hordeolum externum (stye) 04/23/2015   Right eye  . Hypertension   . Impaired vision    glasses  . Osteoarthritis    hands and knees  . Sinusitis, acute 04/23/2015    Current Outpatient Medications on File Prior to Visit  Medication Sig Dispense Refill  . albuterol (PROVENTIL HFA;VENTOLIN HFA) 108 (90 Base) MCG/ACT inhaler Inhale 2 puffs into the lungs every 6 (six) hours as needed for wheezing or shortness of breath. 3 Inhaler 3  . amLODipine (NORVASC) 5 MG tablet Take 1 tablet (5 mg total) by mouth daily. 90 tablet 2  . aspirin-acetaminophen-caffeine (EXCEDRIN MIGRAINE) 902-409-73 MG per tablet Take 2 tablets by mouth every 6 (six) hours as needed (arthritis pain).     . calcium elemental as carbonate (TUMS ULTRA 1000) 400 MG chewable tablet Chew 1,000 mg by mouth 2 (two) times daily.    . Capsaicin-Menthol (SALONPAS GEL EX) Apply 1 application topically daily as needed (arthritis  pain).    . cefdinir (OMNICEF) 300 MG capsule Take 1 capsule (300 mg total) by mouth 2 (two) times daily for 10 days. 20 capsule 0  . fluticasone (FLONASE) 50 MCG/ACT nasal spray USE 2 SPRAYS IN EACH NOSTRIL ONE TIME DAILY 48 g 1  . Fluticasone-Umeclidin-Vilant (TRELEGY ELLIPTA) 100-62.5-25 MCG/INH AEPB Inhale 1 puff into the lungs daily. 180 each 3  . Glucosamine-Chondroitin 750-600 MG TABS Take 1 tablet by mouth 2 (two) times daily.    . Guaifenesin (MUCINEX MAXIMUM STRENGTH) 1200 MG TB12 Take 600 mg by mouth 2 (two) times daily.    Marland Kitchen loratadine (CLARITIN) 10 MG tablet Take 10 mg by mouth daily.    . meloxicam (MOBIC) 15 MG tablet TAKE 1 TABLET DAILY WITH FOOD AS NEEDED FOR PAIN 90 tablet 1  . Menthol (ROBITUSSIN COUGH DROPS MT) Use as directed 1 lozenge in the mouth or throat 4 (four) times daily as needed (cough).     . Menthol, Topical Analgesic, (BLUE-EMU MAXIMUM STRENGTH EX) Apply 1 application topically daily as needed (arthritis pain).    . montelukast (SINGULAIR) 10 MG tablet Take 1 tablet (10 mg total) by mouth daily. 90 tablet 3  . Multiple Vitamin (MULTIVITAMIN WITH MINERALS) TABS tablet Take 1 tablet by mouth on Monday, Wednesday, Friday with supper     . Neomy-Bacit-Polymyx-Pramoxine (TRIPLE ANTIBIOTIC PAIN RELIEF EX) Apply 1 application topically daily as needed (wound care).    . predniSONE (DELTASONE) 10 MG tablet  Take 4 tablets by mouth daily x 3 days, Then 3 tablets x 3 days, Then 2 tablets x 3 days, then 1 tablet x 3 days then 1/2 tab po qd x 3 days 22 tablet 0   No current facility-administered medications on file prior to visit.     Past Surgical History:  Procedure Laterality Date  . HERNIA REPAIR  10/2009  . left knee    . lower back surgery  09/2008   06/2009  . LUNG SURGERY     RML andRUL removed due to bleeding and bronchiectasis and lung abcess  . NECK SURGERY  05/2008  . right foot surgery      No Known Allergies  Social History   Socioeconomic History  .  Marital status: Married    Spouse name: Not on file  . Number of children: Not on file  . Years of education: Not on file  . Highest education level: Not on file  Social Needs  . Financial resource strain: Not on file  . Food insecurity - worry: Not on file  . Food insecurity - inability: Not on file  . Transportation needs - medical: Not on file  . Transportation needs - non-medical: Not on file  Occupational History  . Occupation: retired Music therapist  Tobacco Use  . Smoking status: Former Smoker    Packs/day: 1.50    Years: 38.00    Pack years: 57.00    Types: Cigarettes    Last attempt to quit: 09/03/2000    Years since quitting: 17.0  . Smokeless tobacco: Never Used  Substance and Sexual Activity  . Alcohol use: Yes    Alcohol/week: 1.2 - 1.8 oz    Types: 2 - 3 Cans of beer per week  . Drug use: No  . Sexual activity: No  Other Topics Concern  . Not on file  Social History Narrative   Retired Music therapist   Patient states former smoker. 1 1/2 ppd x 38 yrs  Quit in Jan 2002   Married - 2 weeks (4th marriage)   divorced,  remarried 84-2004 (lost wife to lung ca),  remarried (divorced),    1 son  - 22 [name])   Alcohol use-yes (2-3 beers per week)            Family History  Problem Relation Age of Onset  . Prostate cancer Father        father died prostate ca  . Coronary artery disease Unknown        1st degree relative<60  . Stroke Unknown        1st degree relative<50  . Heart failure Mother        age 45  . Colon cancer Paternal Grandmother   . Obstructive Sleep Apnea Brother   . Obesity Brother     Ht 5\' 7"  (1.702 m)   Wt 150 lb (68 kg)   BMI 23.49 kg/m   Review of Systems: See HPI above.     Objective:  Physical Exam:  Gen: NAD, comfortable in exam room  Back/right hip: No gross deformity, scoliosis. TTP trochanteric bursa, hip external rotators.  No midline or bony TTP. FROM. Strength LEs 5/5 all muscle groups except 4/5  with hip abduction.   2+ MSRs in patellar and left achilles tendons.  1+ right achilles. Negative SLRs. Sensation intact to light touch bilaterally. Negative logroll Negative fabers and piriformis stretches.  Right shoulder: Scapular winging.  No other deformity, swelling,  bruising. No TTP. FROM with painful arc. Discomfort with Wynonia Musty. Negative Yergasons. Strength 5/5 with empty can and resisted internal/external rotation. Negative apprehension. NV intact distally.   Assessment & Plan:  1. Right hip pain - 2/2 combination of snapping of tensor fascia lata, glut medius weakness, and trochanteric bursitis.  Injection given today.  Icing, shown home exercises and stretches.  Consider physical therapy.  F/u in 6 weeks.  After informed written consent timeout was performed, patient was lying on left side on exam table.  Area overlying right trochanteric bursa prepped with alcohol swab then injected with 6:2 bupivicaine: depomedrol.  Patient tolerated procedure well without immediate complications.  2. Right shoulder pain - 2/2 scapular dysfunction and rotator cuff impingement.  Finish prednisone then restart meloxicam.  Shown home exercises to do daily.  Consider physical therapy, injection, imaging, nitro patches if not improving.  F/u in 6 weeks.

## 2017-09-20 NOTE — Assessment & Plan Note (Signed)
2/2 scapular dysfunction and rotator cuff impingement.  Finish prednisone then restart meloxicam.  Shown home exercises to do daily.  Consider physical therapy, injection, imaging, nitro patches if not improving.  F/u in 6 weeks.

## 2017-09-22 ENCOUNTER — Encounter: Payer: Self-pay | Admitting: Family Medicine

## 2017-09-22 ENCOUNTER — Telehealth: Payer: Self-pay | Admitting: Pulmonary Disease

## 2017-09-22 ENCOUNTER — Ambulatory Visit: Payer: Medicare HMO | Admitting: Family Medicine

## 2017-09-22 DIAGNOSIS — M25562 Pain in left knee: Secondary | ICD-10-CM | POA: Diagnosis not present

## 2017-09-22 DIAGNOSIS — G8929 Other chronic pain: Secondary | ICD-10-CM | POA: Diagnosis not present

## 2017-09-22 DIAGNOSIS — M25511 Pain in right shoulder: Secondary | ICD-10-CM

## 2017-09-22 MED ORDER — METHYLPREDNISOLONE ACETATE 40 MG/ML IJ SUSP
40.0000 mg | Freq: Once | INTRAMUSCULAR | Status: AC
  Administered 2017-09-22: 40 mg via INTRA_ARTICULAR

## 2017-09-22 NOTE — Patient Instructions (Signed)
You were given a subacromial injection into your shoulder today.  Your knee pain is due to arthritis. These are the different medications you can take for this: Tylenol 500mg  1-2 tabs three times a day for pain. Capsaicin, aspercreme, or biofreeze topically up to four times a day may also help with pain. Some supplements that may help for arthritis: Boswellia extract, curcumin, pycnogenol Restart meloxicam when you finish the prednisone. Cortisone injections are an option - you were given this today. If cortisone injections do not help, there are different types of shots that may help but they take longer to take effect. It's important that you continue to stay active. Straight leg raises, knee extensions 3 sets of 10 once a day (add ankle weight if these become too easy). Consider physical therapy to strengthen muscles around the joint that hurts to take pressure off of the joint itself. Shoe inserts with good arch support may be helpful. Heat or ice 15 minutes at a time 3-4 times a day as needed to help with pain. Water aerobics and cycling with low resistance are the best two types of exercise for arthritis though any exercise is ok as long as it doesn't worsen the pain. Follow up with me in 5-6 weeks for all issues - 30 minute appointment.

## 2017-09-22 NOTE — Telephone Encounter (Signed)
PA was faxed on 1/18.   Per [employer], tier exception was denied. Spoke with Erlene Quan at [facility]. Started the appeals process. Case #[id number]  Phone #: [phone], opt 3  Fax #: [phone]  Will check back in 72hrs.   Will fax a copy of his last OV, and a letter from last year to help approve the process.

## 2017-09-23 ENCOUNTER — Encounter: Payer: Self-pay | Admitting: Family Medicine

## 2017-09-23 NOTE — Assessment & Plan Note (Signed)
2/2 arthritis.  Rest of exam reassuring.  Had partial meniscectomy back in 2002, common for this to present about 10 years following meniscus injury/surgery in addition to age related degenerative changes.  Discussed tylenol, topical medications, supplements.  Meloxicam when he finishes his prednisone.  Injection given today as well.  Heat/ice.  F/u in 5-6 weeks.  After informed written consent timeout was performed, patient was seated on exam table. Left knee was prepped with alcohol swab and utilizing anteromedial approach, patient's left knee was injected intraarticularly with 3:1 bupivicaine: depomedrol. Patient tolerated the procedure well without immediate complications.

## 2017-09-23 NOTE — Progress Notes (Signed)
PCP: Mosie Lukes, MD  Subjective:   HPI: Patient is a 72 y.o. male here for left knee pain.  Patient reports he's had several months of medial left knee pain. Some occasional swelling of the knee as well. Worse with weather changes, standing a long time. No skin changes, numbness. Pain level currently is 1/10 and a soreness medially. Leg feels weak at times. Also interested in injection for right shoulder.  Past Medical History:  Diagnosis Date  . Allergic rhinitis   . Atypical chest pain 12/01/2014  . Back pain 01/12/2013  . COPD (chronic obstructive pulmonary disease) (HCC)    FeV1 64%-2007  . Emphysema   . GERD (gastroesophageal reflux disease)   . History of lung abscess    bronchiectasis with RMLandRLL ersection -1996- Dr Arlyce Dice  . Hordeolum externum (stye) 04/23/2015   Right eye  . Hypertension   . Impaired vision    glasses  . Osteoarthritis    hands and knees  . Sinusitis, acute 04/23/2015    Current Outpatient Medications on File Prior to Visit  Medication Sig Dispense Refill  . albuterol (PROVENTIL HFA;VENTOLIN HFA) 108 (90 Base) MCG/ACT inhaler Inhale 2 puffs into the lungs every 6 (six) hours as needed for wheezing or shortness of breath. 3 Inhaler 3  . amLODipine (NORVASC) 5 MG tablet Take 1 tablet (5 mg total) by mouth daily. 90 tablet 2  . aspirin-acetaminophen-caffeine (EXCEDRIN MIGRAINE) 878-676-72 MG per tablet Take 2 tablets by mouth every 6 (six) hours as needed (arthritis pain).     . calcium elemental as carbonate (TUMS ULTRA 1000) 400 MG chewable tablet Chew 1,000 mg by mouth 2 (two) times daily.    . Capsaicin-Menthol (SALONPAS GEL EX) Apply 1 application topically daily as needed (arthritis pain).    . cefdinir (OMNICEF) 300 MG capsule Take 1 capsule (300 mg total) by mouth 2 (two) times daily for 10 days. 20 capsule 0  . fluticasone (FLONASE) 50 MCG/ACT nasal spray USE 2 SPRAYS IN EACH NOSTRIL ONE TIME DAILY 48 g 1  . Fluticasone-Umeclidin-Vilant  (TRELEGY ELLIPTA) 100-62.5-25 MCG/INH AEPB Inhale 1 puff into the lungs daily. 180 each 3  . Glucosamine-Chondroitin 750-600 MG TABS Take 1 tablet by mouth 2 (two) times daily.    . Guaifenesin (MUCINEX MAXIMUM STRENGTH) 1200 MG TB12 Take 600 mg by mouth 2 (two) times daily.    Marland Kitchen loratadine (CLARITIN) 10 MG tablet Take 10 mg by mouth daily.    . meloxicam (MOBIC) 15 MG tablet TAKE 1 TABLET DAILY WITH FOOD AS NEEDED FOR PAIN 90 tablet 1  . Menthol (ROBITUSSIN COUGH DROPS MT) Use as directed 1 lozenge in the mouth or throat 4 (four) times daily as needed (cough).     . Menthol, Topical Analgesic, (BLUE-EMU MAXIMUM STRENGTH EX) Apply 1 application topically daily as needed (arthritis pain).    . montelukast (SINGULAIR) 10 MG tablet Take 1 tablet (10 mg total) by mouth daily. 90 tablet 3  . Multiple Vitamin (MULTIVITAMIN WITH MINERALS) TABS tablet Take 1 tablet by mouth on Monday, Wednesday, Friday with supper     . Neomy-Bacit-Polymyx-Pramoxine (TRIPLE ANTIBIOTIC PAIN RELIEF EX) Apply 1 application topically daily as needed (wound care).    . predniSONE (DELTASONE) 10 MG tablet Take 4 tablets by mouth daily x 3 days, Then 3 tablets x 3 days, Then 2 tablets x 3 days, then 1 tablet x 3 days then 1/2 tab po qd x 3 days 22 tablet 0   No current facility-administered  medications on file prior to visit.     Past Surgical History:  Procedure Laterality Date  . HERNIA REPAIR  10/2009  . left knee    . lower back surgery  09/2008   06/2009  . LUNG SURGERY     RML andRUL removed due to bleeding and bronchiectasis and lung abcess  . NECK SURGERY  05/2008  . right foot surgery      No Known Allergies  Social History   Socioeconomic History  . Marital status: Married    Spouse name: Not on file  . Number of children: Not on file  . Years of education: Not on file  . Highest education level: Not on file  Social Needs  . Financial resource strain: Not on file  . Food insecurity - worry: Not on file   . Food insecurity - inability: Not on file  . Transportation needs - medical: Not on file  . Transportation needs - non-medical: Not on file  Occupational History  . Occupation: retired Music therapist  Tobacco Use  . Smoking status: Former Smoker    Packs/day: 1.50    Years: 38.00    Pack years: 57.00    Types: Cigarettes    Last attempt to quit: 09/03/2000    Years since quitting: 17.0  . Smokeless tobacco: Never Used  Substance and Sexual Activity  . Alcohol use: Yes    Alcohol/week: 1.2 - 1.8 oz    Types: 2 - 3 Cans of beer per week  . Drug use: No  . Sexual activity: No  Other Topics Concern  . Not on file  Social History Narrative   Retired Music therapist   Patient states former smoker. 1 1/2 ppd x 38 yrs  Quit in Jan 2002   Married - 2 weeks (4th marriage)   divorced,  remarried 84-2004 (lost wife to lung ca),  remarried (divorced),    1 son  - 40 [name])   Alcohol use-yes (2-3 beers per week)            Family History  Problem Relation Age of Onset  . Prostate cancer Father        father died prostate ca  . Coronary artery disease Unknown        1st degree relative<60  . Stroke Unknown        1st degree relative<50  . Heart failure Mother        age 50  . Colon cancer Paternal Grandmother   . Obstructive Sleep Apnea Brother   . Obesity Brother     BP (!) 161/87   Pulse 83   Ht 5\' 7"  (1.702 m)   Wt 150 lb (68 kg)   BMI 23.49 kg/m   Review of Systems: See HPI above.     Objective:  Physical Exam:  Gen: NAD, comfortable in exam room  Left knee: No gross deformity, ecchymoses, effusion. Mild TTP medial joint line.  No other tenderness. FROM with 5/5 strength. Negative ant/post drawers. Negative valgus/varus testing. Negative lachmanns. Negative mcmurrays, apleys, patellar apprehension. NV intact distally.  Right knee: No deformity. FROM with 5/5 strength. No tenderness to palpation. NVI distally.   Assessment & Plan:  1.  Left knee pain - 2/2 arthritis.  Rest of exam reassuring.  Had partial meniscectomy back in 2002, common for this to present about 10 years following meniscus injury/surgery in addition to age related degenerative changes.  Discussed tylenol, topical medications, supplements.  Meloxicam when  he finishes his prednisone.  Injection given today as well.  Heat/ice.  F/u in 5-6 weeks.  After informed written consent timeout was performed, patient was seated on exam table. Left knee was prepped with alcohol swab and utilizing anteromedial approach, patient's left knee was injected intraarticularly with 3:1 bupivicaine: depomedrol. Patient tolerated the procedure well without immediate complications.  2. Right shoulder pain - discussed last visit.  Subacromial injection given today.  After informed written consent timeout was performed, patient was seated on exam table. Right shoulder was prepped with alcohol swab and utilizing posterior approach, patient's right subacromial space was injected with 3:1 bupivicaine: depomedrol. Patient tolerated the procedure well without immediate complications.

## 2017-09-23 NOTE — Telephone Encounter (Signed)
Received fax from [facility] stating that the tier exception appeal was denied. It can be appealed again through [employer].   Spoke with patient to make him aware. Advised him that I would try to appeal it again. He verbalized understanding.

## 2017-09-23 NOTE — Assessment & Plan Note (Signed)
discussed last visit.  Subacromial injection given today.  After informed written consent timeout was performed, patient was seated on exam table. Right shoulder was prepped with alcohol swab and utilizing posterior approach, patient's right subacromial space was injected with 3:1 bupivicaine: depomedrol. Patient tolerated the procedure well without immediate complications.

## 2017-09-24 NOTE — Telephone Encounter (Signed)
Still awaiting results of second appeal. Patient is aware of status.

## 2017-09-29 NOTE — Telephone Encounter (Signed)
Attempted to call pt letting him know we were going to put him on symbicort and spiriva due to the trelegy still being denied by his insurance, but unable to reach pt.  Left message for pt to return our call x1.    Will send Rx to pt's preferred pharmacy after we discuss this information with pt.

## 2017-09-29 NOTE — Telephone Encounter (Signed)
Pt's Trelegy PA has been denied, an appeal was started but this also was denied.   Dr. Elsworth Soho please advise if you would like to change to another medication or try to appeal the second denial. Thanks.

## 2017-09-29 NOTE — Telephone Encounter (Signed)
Spoke with pt and stated to him that we could start him on symbicort 160 and spiriva since the trelegy has been denied again.  Pt stated to me he did not want to go back on the symbicort or spiriva due to them not working in the past that he will just get together the money to pay for the trelegy.  Pt stated the trelegy has really worked well for him so he will do whatever he has to to continue on that inhaler.  Nothing further needed at this current time.

## 2017-09-29 NOTE — Telephone Encounter (Signed)
Symbicort 162 puffs twice daily Spiriva once daily

## 2017-10-01 ENCOUNTER — Ambulatory Visit: Payer: Medicare HMO | Admitting: Adult Health

## 2017-10-01 ENCOUNTER — Encounter: Payer: Self-pay | Admitting: Adult Health

## 2017-10-01 DIAGNOSIS — J449 Chronic obstructive pulmonary disease, unspecified: Secondary | ICD-10-CM | POA: Diagnosis not present

## 2017-10-01 DIAGNOSIS — J301 Allergic rhinitis due to pollen: Secondary | ICD-10-CM

## 2017-10-01 NOTE — Patient Instructions (Signed)
Continue on TRELEGY daily .  Mucinex DM Twice daily  As needed  Cough/congestion  Continue on Singulair daily.  follow up Dr. Elsworth Soho  In 6 months and As needed

## 2017-10-01 NOTE — Progress Notes (Signed)
@Patient  ID: [name], male    DOB: 1945/10/03, 72 y.o.   MRN: [id number]  Chief Complaint  Patient presents with  . Follow-up    COPD     Referring provider: Mosie Lukes, MD  HPI: 72yo male former smoker with COPD  (Spirometry FEV1 53%, ratio 54-2014) Lung abscess in 1996 s/p RML resection   10/01/2017 Follow up : COPD Pt returns for 6 month follow up for moderate COPD . Says he has been doing okay , had COPD flare with bronchitis earlier this month. Seen by PCP started on antibiotic and prednisone . Says he is feeling better but still has cough with thick mucus and sinus drainage. Mucus is white. Appetite is good.  Vaccines are utd.   [place] on regular basis all year round. Very active out doors.   No Known Allergies  Immunization History  Administered Date(s) Administered  . Influenza Split 06/02/2012  . Influenza Whole 05/15/2010, 05/02/2011, 05/18/2013  . Influenza, High Dose Seasonal PF 04/20/2017  . Influenza,inj,Quad PF,6+ Mos 05/14/2014, 05/25/2015  . Influenza-Unspecified 05/15/2016  . Pneumococcal Conjugate-13 05/20/2013, 05/15/2016  . Pneumococcal Polysaccharide-23 07/03/2001, 05/02/2011  . Td 05/15/2004  . Tdap 02/17/2014  . Zoster 07/16/2011    Past Medical History:  Diagnosis Date  . Allergic rhinitis   . Atypical chest pain 12/01/2014  . Back pain 01/12/2013  . COPD (chronic obstructive pulmonary disease) (HCC)    FeV1 64%-2007  . Emphysema   . GERD (gastroesophageal reflux disease)   . History of lung abscess    bronchiectasis with RMLandRLL ersection -1996- Dr Arlyce Dice  . Hordeolum externum (stye) 04/23/2015   Right eye  . Hypertension   . Impaired vision    glasses  . Osteoarthritis    hands and knees  . Sinusitis, acute 04/23/2015    Tobacco History: Social History   Tobacco Use  Smoking Status Former Smoker  . Packs/day: 1.50  . Years: 38.00  . Pack years: 57.00  . Types: Cigarettes  . Last attempt to quit: 09/03/2000  .  Years since quitting: 17.0  Smokeless Tobacco Never Used   Counseling given: Not Answered   Outpatient Encounter Medications as of 10/01/2017  Medication Sig  . albuterol (PROVENTIL HFA;VENTOLIN HFA) 108 (90 Base) MCG/ACT inhaler Inhale 2 puffs into the lungs every 6 (six) hours as needed for wheezing or shortness of breath.  Marland Kitchen amLODipine (NORVASC) 5 MG tablet Take 1 tablet (5 mg total) by mouth daily.  Marland Kitchen aspirin-acetaminophen-caffeine (EXCEDRIN MIGRAINE) 250-250-65 MG per tablet Take 2 tablets by mouth every 6 (six) hours as needed (arthritis pain).   . calcium elemental as carbonate (TUMS ULTRA 1000) 400 MG chewable tablet Chew 1,000 mg by mouth 2 (two) times daily.  . Capsaicin-Menthol (SALONPAS GEL EX) Apply 1 application topically daily as needed (arthritis pain).  . fluticasone (FLONASE) 50 MCG/ACT nasal spray USE 2 SPRAYS IN EACH NOSTRIL ONE TIME DAILY  . Fluticasone-Umeclidin-Vilant (TRELEGY ELLIPTA) 100-62.5-25 MCG/INH AEPB Inhale 1 puff into the lungs daily.  . Glucosamine-Chondroitin 750-600 MG TABS Take 1 tablet by mouth 2 (two) times daily.  . Guaifenesin (MUCINEX MAXIMUM STRENGTH) 1200 MG TB12 Take 600 mg by mouth 2 (two) times daily.  Marland Kitchen loratadine (CLARITIN) 10 MG tablet Take 10 mg by mouth daily.  . meloxicam (MOBIC) 15 MG tablet TAKE 1 TABLET DAILY WITH FOOD AS NEEDED FOR PAIN  . Menthol (ROBITUSSIN COUGH DROPS MT) Use as directed 1 lozenge in the mouth or throat 4 (four) times  daily as needed (cough).   . Menthol, Topical Analgesic, (BLUE-EMU MAXIMUM STRENGTH EX) Apply 1 application topically daily as needed (arthritis pain).  . montelukast (SINGULAIR) 10 MG tablet Take 1 tablet (10 mg total) by mouth daily.  . Multiple Vitamin (MULTIVITAMIN WITH MINERALS) TABS tablet Take 1 tablet by mouth on Monday, Wednesday, Friday with supper   . Neomy-Bacit-Polymyx-Pramoxine (TRIPLE ANTIBIOTIC PAIN RELIEF EX) Apply 1 application topically daily as needed (wound care).  . [DISCONTINUED]  predniSONE (DELTASONE) 10 MG tablet Take 4 tablets by mouth daily x 3 days, Then 3 tablets x 3 days, Then 2 tablets x 3 days, then 1 tablet x 3 days then 1/2 tab po qd x 3 days (Patient not taking: Reported on 10/01/2017)   No facility-administered encounter medications on file as of 10/01/2017.      Review of Systems  Constitutional:   No  weight loss, night sweats,  Fevers, chills, fatigue, or  lassitude.  HEENT:   No headaches,  Difficulty swallowing,  Tooth/dental problems, or  Sore throat,                No sneezing, itching, ear ache,  +nasal congestion, post nasal drip,   CV:  No chest pain,  Orthopnea, PND, swelling in lower extremities, anasarca, dizziness, palpitations, syncope.   GI  No heartburn, indigestion, abdominal pain, nausea, vomiting, diarrhea, change in bowel habits, loss of appetite, bloody stools.   Resp:    No chest wall deformity  Skin: no rash or lesions.  GU: no dysuria, change in color of urine, no urgency or frequency.  No flank pain, no hematuria   MS:  No joint pain or swelling.  No decreased range of motion.  No back pain.    Physical Exam  BP 130/72 (BP Location: Left Arm, Cuff Size: Normal)   Pulse (!) 55   Ht 5\' 7"  (1.702 m)   Wt 149 lb 9.6 oz (67.9 kg)   SpO2 95%   BMI 23.43 kg/m   GEN: A/Ox3; pleasant , NAD, elderly thin    HEENT:  [place]/AT,  EACs-clear, TMs-wnl, NOSE-clear, THROAT-clear, no lesions, no postnasal drip or exudate noted.   NECK:  Supple w/ fair ROM; no JVD; normal carotid impulses w/o bruits; no thyromegaly or nodules palpated; no lymphadenopathy.    RESP  Clear  P & A; w/o, wheezes/ rales/ or rhonchi. no accessory muscle use, no dullness to percussion  CARD:  RRR, no m/r/g, no peripheral edema, pulses intact, no cyanosis or clubbing.  GI:   Soft & nt; nml bowel sounds; no organomegaly or masses detected.   Musco: Warm bil, no deformities or joint swelling noted.   Neuro: alert, no focal deficits noted.    Skin:  Warm, no lesions or rashes    Lab Results:  CBC    Component Value Date/Time   WBC 7.5 09/15/2017 1148   RBC 4.52 09/15/2017 1148   HGB 14.0 09/15/2017 1148   HCT 41.8 09/15/2017 1148   PLT 340.0 09/15/2017 1148   MCV 92.4 09/15/2017 1148   MCH 31.0 09/30/2016 0535   MCHC 33.4 09/15/2017 1148   RDW 13.2 09/15/2017 1148   LYMPHSABS 1.0 10/11/2016 1209   MONOABS 0.3 10/11/2016 1209   EOSABS 0.0 10/11/2016 1209   BASOSABS 0.0 10/11/2016 1209    BMET    Component Value Date/Time   NA 138 09/15/2017 1148   K 4.6 09/15/2017 1148   CL 103 09/15/2017 1148   CO2 29 09/15/2017 1148  GLUCOSE 79 09/15/2017 1148   BUN 24 (H) 09/15/2017 1148   CREATININE 0.75 09/15/2017 1148   CREATININE 0.75 07/22/2014 1031   CALCIUM 9.4 09/15/2017 1148   GFRNONAA >60 09/30/2016 0535   GFRNONAA >89 07/22/2014 1031   GFRAA >60 09/30/2016 0535   GFRAA >89 07/22/2014 1031    BNP No results found for: BNP  ProBNP    Component Value Date/Time   PROBNP 18.0 07/20/2013 1403    Imaging: No results found.   Assessment & Plan:   COPD (chronic obstructive pulmonary disease) (HCC) Recent flare now resolving  xopenex neb today  mucinex As needed   Cont on Trelegy     Allergic rhinitis Cont on Singulair daily.  Use saline nasal rinses As needed        Rexene Edison, NP 10/01/2017

## 2017-10-01 NOTE — Assessment & Plan Note (Signed)
Recent flare now resolving  xopenex neb today  mucinex As needed   Cont on Trelegy

## 2017-10-01 NOTE — Assessment & Plan Note (Addendum)
Cont on Singulair daily.  Use saline nasal rinses As needed

## 2017-10-03 DIAGNOSIS — L821 Other seborrheic keratosis: Secondary | ICD-10-CM | POA: Diagnosis not present

## 2017-10-03 DIAGNOSIS — Z872 Personal history of diseases of the skin and subcutaneous tissue: Secondary | ICD-10-CM | POA: Diagnosis not present

## 2017-10-03 DIAGNOSIS — L814 Other melanin hyperpigmentation: Secondary | ICD-10-CM | POA: Diagnosis not present

## 2017-10-08 ENCOUNTER — Telehealth: Payer: Self-pay | Admitting: Acute Care

## 2017-10-08 NOTE — Telephone Encounter (Signed)
Patient states he quit smoking in 2002 and has been told before that he does not qualify.  He states if something has changed and he does qualify, please call him to schedule.  If not, he does not need a call back.

## 2017-10-10 NOTE — Telephone Encounter (Signed)
Due to pt quit smoking in 2002 , he does not qualify for lung cancer screening.  Referral will be cancelled.

## 2017-10-17 ENCOUNTER — Telehealth: Payer: Self-pay | Admitting: Adult Health

## 2017-10-17 MED ORDER — AZITHROMYCIN 250 MG PO TABS: ORAL_TABLET | ORAL | 0 refills | Status: AC

## 2017-10-17 NOTE — Telephone Encounter (Signed)
Spoke with patient. He was seen on 10/01/17 by TP. He stated that during that visit, he had a slight cough and sinus infection. He was advised to call back in 2 weeks if he wasn't feeling better.   Patient stated that he has been coughing up clear, thick phlegm. Facial pain around his nose. Denies any body aches or fever. He wishes to have something called in for him.   Pharmacy is [facility] in [place] on [address].   TP, please advise. Thanks!

## 2017-10-17 NOTE — Telephone Encounter (Signed)
Zpack take as directed #1  mucinex DM Twice daily  As needed   Saline nasal rinses As needed   Please contact office for sooner follow up if symptoms do not improve or worsen or seek emergency care

## 2017-10-17 NOTE — Telephone Encounter (Signed)
Pt is aware of below message and voiced his understanding.  Rx for zpak has been sent to preferred pharmacy. Nothing further is needed.

## 2017-10-23 ENCOUNTER — Telehealth: Payer: Self-pay | Admitting: Family Medicine

## 2017-10-23 NOTE — Telephone Encounter (Signed)
Please find out which dermatologist he wants to see and then place a referral for skin lesion

## 2017-10-23 NOTE — Telephone Encounter (Signed)
Please advise 

## 2017-10-23 NOTE — Telephone Encounter (Signed)
Copied from [place]. Topic: Quick Communication - See Telephone Encounter >> Oct 23, 2017  9:06 AM Cleaster Corin, NT wrote: CRM for notification. See Telephone encounter for:   10/23/17. Pt. Calling to let Dr. Charlett Blake know that he has been denied to go see the dermatologist due to not having a formal referral.pt. Would like for someone to give him a call about what to do next. Pt. Can be reached at [phone]

## 2017-10-27 ENCOUNTER — Ambulatory Visit: Payer: Medicare HMO | Admitting: Family Medicine

## 2017-11-03 ENCOUNTER — Ambulatory Visit: Payer: Medicare HMO | Admitting: Family Medicine

## 2017-11-05 ENCOUNTER — Ambulatory Visit: Payer: Medicare HMO | Admitting: Family Medicine

## 2017-11-05 ENCOUNTER — Encounter: Payer: Self-pay | Admitting: Family Medicine

## 2017-11-05 ENCOUNTER — Other Ambulatory Visit: Payer: Self-pay | Admitting: Pulmonary Disease

## 2017-11-05 DIAGNOSIS — M25511 Pain in right shoulder: Secondary | ICD-10-CM

## 2017-11-05 DIAGNOSIS — M25562 Pain in left knee: Secondary | ICD-10-CM | POA: Diagnosis not present

## 2017-11-05 DIAGNOSIS — G8929 Other chronic pain: Secondary | ICD-10-CM | POA: Diagnosis not present

## 2017-11-05 DIAGNOSIS — M25551 Pain in right hip: Secondary | ICD-10-CM | POA: Diagnosis not present

## 2017-11-05 MED ORDER — NITROGLYCERIN 0.2 MG/HR TD PT24: MEDICATED_PATCH | TRANSDERMAL | 1 refills | Status: DC

## 2017-11-05 MED FILL — NITROGLYCERIN 0.2 MG/HR PAT: 0.2 | 28 days supply | Qty: 7 | Fill #0

## 2017-11-05 NOTE — Patient Instructions (Signed)
Try the nitro patches for your shoulder - 1/4th patch to affected shoulder, change daily. Continue home exercises - try the yellow theraband instead for your shoulder and hip exercises. We will put you through the orthovisc portal to see what the cost would be, if it's covered. Consider physical therapy for these issues as we discussed. Follow up with me in 6 weeks for reevaluation - if you want to go ahead with orthovisc for your knee after we assess this, let me know.

## 2017-11-05 NOTE — Telephone Encounter (Signed)
Called pt no answer left voice mail to callback

## 2017-11-06 ENCOUNTER — Other Ambulatory Visit: Payer: Self-pay | Admitting: *Deleted

## 2017-11-06 DIAGNOSIS — L570 Actinic keratosis: Secondary | ICD-10-CM

## 2017-11-08 ENCOUNTER — Encounter: Payer: Self-pay | Admitting: Family Medicine

## 2017-11-08 NOTE — Assessment & Plan Note (Signed)
2/2 rotator cuff impingement.  S/p subacromial injection with only mild benefit.  Continue home exercises - try nitro patches also.  Consider physical therapy.  F/u in 6 weeks.

## 2017-11-08 NOTE — Assessment & Plan Note (Signed)
2/2 IT band syndrome, trochanteric bursitis, hip external rotator weakness/strain.  Discussed how to modify his home exercise program to minimize pain.  Consider physical therapy.  F/u in 6 weeks.

## 2017-11-08 NOTE — Assessment & Plan Note (Signed)
2/2 arthritis.  S/p partial meniscectomy in 2002.  Tylenol, topical medications, supplements, meloxicam.  Cortisone injection only gave mild relief.  Will check on orthovisc for him as well.  Encouraged home exercises.  Consider physical therapy.

## 2017-11-08 NOTE — Progress Notes (Signed)
PCP: Mosie Lukes, MD  Subjective:   HPI: Patient is a 72 y.o. male here for left knee pain, right hip, right shoulder pain.  1/21: Patient reports he's had several months of medial left knee pain. Some occasional swelling of the knee as well. Worse with weather changes, standing a long time. No skin changes, numbness. Pain level currently is 1/10 and a soreness medially. Leg feels weak at times. Also interested in injection for right shoulder.  3/6: Patient reports injections for his right hip, shoulder, left knee each helped for a couple weeks. He has been doing home exercises some but the hip exercises seem to worsen his back. Right hip pain is 5/10 and sharp posterior. Worse with sitting, standing, walking. Right shoulder pain is 2/10 and sharp, difficulty sleeping. Some radiation down into all fingers. Worse with motions of shoulder such as reaching, overhead motions. Left knee worse with walking, anterior pain. No swelling. No skin changes. Taking mobic daily.  Past Medical History:  Diagnosis Date  . Allergic rhinitis   . Atypical chest pain 12/01/2014  . Back pain 01/12/2013  . COPD (chronic obstructive pulmonary disease) (HCC)    FeV1 64%-2007  . Emphysema   . GERD (gastroesophageal reflux disease)   . History of lung abscess    bronchiectasis with RMLandRLL ersection -1996- Dr Arlyce Dice  . Hordeolum externum (stye) 04/23/2015   Right eye  . Hypertension   . Impaired vision    glasses  . Osteoarthritis    hands and knees  . Sinusitis, acute 04/23/2015    Current Outpatient Medications on File Prior to Visit  Medication Sig Dispense Refill  . albuterol (PROVENTIL HFA;VENTOLIN HFA) 108 (90 Base) MCG/ACT inhaler Inhale 2 puffs into the lungs every 6 (six) hours as needed for wheezing or shortness of breath. 3 Inhaler 3  . amLODipine (NORVASC) 5 MG tablet Take 1 tablet (5 mg total) by mouth daily. 90 tablet 2  . aspirin-acetaminophen-caffeine (EXCEDRIN MIGRAINE)  671-245-80 MG per tablet Take 2 tablets by mouth every 6 (six) hours as needed (arthritis pain).     . calcium elemental as carbonate (TUMS ULTRA 1000) 400 MG chewable tablet Chew 1,000 mg by mouth 2 (two) times daily.    . Capsaicin-Menthol (SALONPAS GEL EX) Apply 1 application topically daily as needed (arthritis pain).    . fluticasone (FLONASE) 50 MCG/ACT nasal spray USE 2 SPRAYS IN EACH NOSTRIL ONE TIME DAILY 48 g 1  . Glucosamine-Chondroitin 750-600 MG TABS Take 1 tablet by mouth 2 (two) times daily.    . Guaifenesin (MUCINEX MAXIMUM STRENGTH) 1200 MG TB12 Take 600 mg by mouth 2 (two) times daily.    Marland Kitchen loratadine (CLARITIN) 10 MG tablet Take 10 mg by mouth daily.    . meloxicam (MOBIC) 15 MG tablet TAKE 1 TABLET DAILY WITH FOOD AS NEEDED FOR PAIN 90 tablet 1  . Menthol (ROBITUSSIN COUGH DROPS MT) Use as directed 1 lozenge in the mouth or throat 4 (four) times daily as needed (cough).     . Menthol, Topical Analgesic, (BLUE-EMU MAXIMUM STRENGTH EX) Apply 1 application topically daily as needed (arthritis pain).    . montelukast (SINGULAIR) 10 MG tablet Take 1 tablet (10 mg total) by mouth daily. 90 tablet 3  . Multiple Vitamin (MULTIVITAMIN WITH MINERALS) TABS tablet Take 1 tablet by mouth on Monday, Wednesday, Friday with supper     . Neomy-Bacit-Polymyx-Pramoxine (TRIPLE ANTIBIOTIC PAIN RELIEF EX) Apply 1 application topically daily as needed (wound care).  No current facility-administered medications on file prior to visit.     Past Surgical History:  Procedure Laterality Date  . HERNIA REPAIR  10/2009  . left knee    . lower back surgery  09/2008   06/2009  . LUNG SURGERY     RML andRUL removed due to bleeding and bronchiectasis and lung abcess  . NECK SURGERY  05/2008  . right foot surgery      No Known Allergies  Social History   Socioeconomic History  . Marital status: Married    Spouse name: Not on file  . Number of children: Not on file  . Years of education: Not on  file  . Highest education level: Not on file  Social Needs  . Financial resource strain: Not on file  . Food insecurity - worry: Not on file  . Food insecurity - inability: Not on file  . Transportation needs - medical: Not on file  . Transportation needs - non-medical: Not on file  Occupational History  . Occupation: retired Music therapist  Tobacco Use  . Smoking status: Former Smoker    Packs/day: 1.50    Years: 38.00    Pack years: 57.00    Types: Cigarettes    Last attempt to quit: 09/03/2000    Years since quitting: 17.1  . Smokeless tobacco: Never Used  Substance and Sexual Activity  . Alcohol use: Yes    Alcohol/week: 1.2 - 1.8 oz    Types: 2 - 3 Cans of beer per week  . Drug use: No  . Sexual activity: No  Other Topics Concern  . Not on file  Social History Narrative   Retired Music therapist   Patient states former smoker. 1 1/2 ppd x 38 yrs  Quit in Jan 2002   Married - 2 weeks (4th marriage)   divorced,  remarried 84-2004 (lost wife to lung ca),  remarried (divorced),    1 son  - 76 [name])   Alcohol use-yes (2-3 beers per week)            Family History  Problem Relation Age of Onset  . Prostate cancer Father        father died prostate ca  . Coronary artery disease Unknown        1st degree relative<60  . Stroke Unknown        1st degree relative<50  . Heart failure Mother        age 57  . Colon cancer Paternal Grandmother   . Obstructive Sleep Apnea Brother   . Obesity Brother     BP (!) 142/83   Pulse 74   Ht 5\' 7"  (1.702 m)   Wt 150 lb (68 kg)   BMI 23.49 kg/m   Review of Systems: See HPI above.     Objective:  Physical Exam:  Gen: NAD, comfortable in exam room.  Right hip: No deformity.   TTP trochanteric bursa and posterior to this within hip external rotators.  No midline, bony tenderness. FROM with 5/5 strength except 4/5 hip abduction.   Negative SLR. Negative logroll.  Right shoulder: Scapular winging.  No  swelling, ecchymoses.  No gross deformity. No TTP. FROM with painful arc. Mild positive Hawkins, Neers. Negative Yergasons. Strength 5/5 with empty can and resisted internal/external rotation. Negative apprehension.  Mild pain empty can. NV intact distally.  Left knee: No gross deformity, ecchymoses, effusion. Mild TTP medial joint line.  No other tenderness. FROM with 5/5  strength. Negative ant/post drawers. Negative valgus/varus testing. Negative lachmanns. Negative mcmurrays, apleys, patellar apprehension. NV intact distally.   Assessment & Plan:  1. Left knee pain - 2/2 arthritis.  S/p partial meniscectomy in 2002.  Tylenol, topical medications, supplements, meloxicam.  Cortisone injection only gave mild relief.  Will check on orthovisc for him as well.  Encouraged home exercises.  Consider physical therapy.  2. Right shoulder pain - 2/2 rotator cuff impingement.  S/p subacromial injection with only mild benefit.  Continue home exercises - try nitro patches also.  Consider physical therapy.  F/u in 6 weeks.  3. Right hip pain - 2/2 IT band syndrome, trochanteric bursitis, hip external rotator weakness/strain.  Discussed how to modify his home exercise program to minimize pain.  Consider physical therapy.  F/u in 6 weeks.

## 2017-11-13 ENCOUNTER — Telehealth: Payer: Self-pay | Admitting: *Deleted

## 2017-11-13 NOTE — Telephone Encounter (Signed)
Received copy of [place] via mail; forwarded to provider, then will have scanned into pt's chart /SLS 03/14

## 2017-12-16 ENCOUNTER — Encounter: Payer: Self-pay | Admitting: Family Medicine

## 2017-12-16 ENCOUNTER — Ambulatory Visit: Payer: Medicare HMO | Admitting: Family Medicine

## 2017-12-16 DIAGNOSIS — M25511 Pain in right shoulder: Secondary | ICD-10-CM | POA: Diagnosis not present

## 2017-12-16 DIAGNOSIS — M1712 Unilateral primary osteoarthritis, left knee: Secondary | ICD-10-CM | POA: Diagnosis not present

## 2017-12-16 DIAGNOSIS — G8929 Other chronic pain: Secondary | ICD-10-CM

## 2017-12-16 NOTE — Patient Instructions (Signed)
Follow up with me in 1 week for the second orthovisc injection. Continue with the laser and exercises for your shoulder. Consider physical therapy, ultrasound/MRI of your shoulder if this doesn't continue to improve.

## 2017-12-18 ENCOUNTER — Encounter: Payer: Self-pay | Admitting: Family Medicine

## 2017-12-18 DIAGNOSIS — M1712 Unilateral primary osteoarthritis, left knee: Secondary | ICD-10-CM | POA: Insufficient documentation

## 2017-12-18 NOTE — Assessment & Plan Note (Signed)
S/p partial meniscectomy in 2002.  Start orthovisc series today.  Only mild relief with cortisone.  Tylenol, topical medications, supplements, meloxicam if needed.  F/u in 1 week for second injection.  After informed written consent timeout was performed, patient was seated on exam table. Left knee was prepped with alcohol swab and utilizing anteromedial approach, patient's left knee was injected intraarticularly with 47mL bupivicaine followed by orthovisc. Patient tolerated the procedure well without immediate complications.

## 2017-12-18 NOTE — Assessment & Plan Note (Signed)
2/2 rotator cuff impingement.  Continue with laser treatment, home exercises.  Headache with nitro patches so stopped using.

## 2017-12-18 NOTE — Progress Notes (Signed)
PCP: Mosie Lukes, MD  Subjective:   HPI: Patient is a 72 y.o. male here for left knee pain, right hip, right shoulder pain.  1/21: Patient reports he's had several months of medial left knee pain. Some occasional swelling of the knee as well. Worse with weather changes, standing a long time. No skin changes, numbness. Pain level currently is 1/10 and a soreness medially. Leg feels weak at times. Also interested in injection for right shoulder.  3/6: Patient reports injections for his right hip, shoulder, left knee each helped for a couple weeks. He has been doing home exercises some but the hip exercises seem to worsen his back. Right hip pain is 5/10 and sharp posterior. Worse with sitting, standing, walking. Right shoulder pain is 2/10 and sharp, difficulty sleeping. Some radiation down into all fingers. Worse with motions of shoulder such as reaching, overhead motions. Left knee worse with walking, anterior pain. No swelling. No skin changes. Taking mobic daily.  4/16: Patient reports his right hip is better at 2/10 level, doing home exercises. Right shoulder improved with laser treatment, down to 4/10 level. Couldn't tolerate nitro patches due to headaches. Initially couldn't do home exercises because they hurt worse - improved with laser and able to do now. Left knee worse since yesterday at 8/10 and sharp medial and anterior. Could hardly bear weight because of the pain. No obvious injury but thinks he may have twisted this. No skin changes, numbness.  Past Medical History:  Diagnosis Date  . Allergic rhinitis   . Atypical chest pain 12/01/2014  . Back pain 01/12/2013  . COPD (chronic obstructive pulmonary disease) (HCC)    FeV1 64%-2007  . Emphysema   . GERD (gastroesophageal reflux disease)   . History of lung abscess    bronchiectasis with RMLandRLL ersection -1996- Dr Arlyce Dice  . Hordeolum externum (stye) 04/23/2015   Right eye  . Hypertension   .  Impaired vision    glasses  . Osteoarthritis    hands and knees  . Sinusitis, acute 04/23/2015    Current Outpatient Medications on File Prior to Visit  Medication Sig Dispense Refill  . albuterol (PROVENTIL HFA;VENTOLIN HFA) 108 (90 Base) MCG/ACT inhaler Inhale 2 puffs into the lungs every 6 (six) hours as needed for wheezing or shortness of breath. 3 Inhaler 3  . amLODipine (NORVASC) 5 MG tablet Take 1 tablet (5 mg total) by mouth daily. 90 tablet 2  . aspirin-acetaminophen-caffeine (EXCEDRIN MIGRAINE) 756-433-29 MG per tablet Take 2 tablets by mouth every 6 (six) hours as needed (arthritis pain).     . calcium elemental as carbonate (TUMS ULTRA 1000) 400 MG chewable tablet Chew 1,000 mg by mouth 2 (two) times daily.    . Capsaicin-Menthol (SALONPAS GEL EX) Apply 1 application topically daily as needed (arthritis pain).    . fluticasone (FLONASE) 50 MCG/ACT nasal spray USE 2 SPRAYS IN EACH NOSTRIL ONE TIME DAILY 48 g 1  . Glucosamine-Chondroitin 750-600 MG TABS Take 1 tablet by mouth 2 (two) times daily.    . Guaifenesin (MUCINEX MAXIMUM STRENGTH) 1200 MG TB12 Take 600 mg by mouth 2 (two) times daily.    . imiquimod (ALDARA) 5 % cream   2  . loratadine (CLARITIN) 10 MG tablet Take 10 mg by mouth daily.    . meloxicam (MOBIC) 15 MG tablet TAKE 1 TABLET DAILY WITH FOOD AS NEEDED FOR PAIN 90 tablet 1  . Menthol (ROBITUSSIN COUGH DROPS MT) Use as directed 1 lozenge in the  mouth or throat 4 (four) times daily as needed (cough).     . Menthol, Topical Analgesic, (BLUE-EMU MAXIMUM STRENGTH EX) Apply 1 application topically daily as needed (arthritis pain).    . montelukast (SINGULAIR) 10 MG tablet Take 1 tablet (10 mg total) by mouth daily. 90 tablet 3  . Multiple Vitamin (MULTIVITAMIN WITH MINERALS) TABS tablet Take 1 tablet by mouth on Monday, Wednesday, Friday with supper     . Neomy-Bacit-Polymyx-Pramoxine (TRIPLE ANTIBIOTIC PAIN RELIEF EX) Apply 1 application topically daily as needed (wound  care).    . nitroGLYCERIN (NITRODUR - DOSED IN MG/24 HR) 0.2 mg/hr patch Apply 1/4th patch to affected shoulder, change daily 30 patch 1  . TRELEGY ELLIPTA 100-62.5-25 MCG/INH AEPB INHALE 1 PUFF INTO THE LUNGS DAILY. 180 each 3   No current facility-administered medications on file prior to visit.     Past Surgical History:  Procedure Laterality Date  . HERNIA REPAIR  10/2009  . left knee    . lower back surgery  09/2008   06/2009  . LUNG SURGERY     RML andRUL removed due to bleeding and bronchiectasis and lung abcess  . NECK SURGERY  05/2008  . right foot surgery      No Known Allergies  Social History   Socioeconomic History  . Marital status: Married    Spouse name: Not on file  . Number of children: Not on file  . Years of education: Not on file  . Highest education level: Not on file  Occupational History  . Occupation: retired Music therapist  Social Needs  . Financial resource strain: Not on file  . Food insecurity:    Worry: Not on file    Inability: Not on file  . Transportation needs:    Medical: Not on file    Non-medical: Not on file  Tobacco Use  . Smoking status: Former Smoker    Packs/day: 1.50    Years: 38.00    Pack years: 57.00    Types: Cigarettes    Last attempt to quit: 09/03/2000    Years since quitting: 17.3  . Smokeless tobacco: Never Used  Substance and Sexual Activity  . Alcohol use: Yes    Alcohol/week: 1.2 - 1.8 oz    Types: 2 - 3 Cans of beer per week  . Drug use: No  . Sexual activity: Never  Lifestyle  . Physical activity:    Days per week: Not on file    Minutes per session: Not on file  . Stress: Not on file  Relationships  . Social connections:    Talks on phone: Not on file    Gets together: Not on file    Attends religious service: Not on file    Active member of club or organization: Not on file    Attends meetings of clubs or organizations: Not on file    Relationship status: Not on file  . Intimate partner  violence:    Fear of current or ex partner: Not on file    Emotionally abused: Not on file    Physically abused: Not on file    Forced sexual activity: Not on file  Other Topics Concern  . Not on file  Social History Narrative   Retired Music therapist   Patient states former smoker. 1 1/2 ppd x 38 yrs  Quit in Jan 2002   Married - 2 weeks (4th marriage)   divorced,  remarried 84-2004 (lost wife to lung  ca),  remarried (divorced),    1 son  - 64 [name])   Alcohol use-yes (2-3 beers per week)            Family History  Problem Relation Age of Onset  . Prostate cancer Father        father died prostate ca  . Coronary artery disease Unknown        1st degree relative<60  . Stroke Unknown        1st degree relative<50  . Heart failure Mother        age 63  . Colon cancer Paternal Grandmother   . Obstructive Sleep Apnea Brother   . Obesity Brother     BP (!) 145/80   Pulse 81   Ht 5\' 7"  (1.702 m)   Wt 150 lb (68 kg)   BMI 23.49 kg/m   Review of Systems: See HPI above.     Objective:  Physical Exam:  Gen: NAD, comfortable in exam room  Right shoulder: Scapular winging.  No swelling, ecchymoses.  No gross deformity. No TTP. FROM with painful arc. Positive Hawkins, Neers. Negative Yergasons. Strength 5/5 with empty can and resisted internal/external rotation. NV intact distally.  Left knee: No gross deformity, ecchymoses, swelling. Mild TTP medial joint line. FROM with 5/5 strength. Negative ant/post drawers. Negative valgus/varus testing. Negative lachmanns. Negative mcmurrays, apleys, patellar apprehension. NV intact distally.   Assessment & Plan:  1. Left knee pain - 2/2 arthritis.  S/p partial meniscectomy in 2002.  Start orthovisc series today.  Only mild relief with cortisone.  Tylenol, topical medications, supplements, meloxicam if needed.  F/u in 1 week for second injection.  After informed written consent timeout was performed, patient was  seated on exam table. Left knee was prepped with alcohol swab and utilizing anteromedial approach, patient's left knee was injected intraarticularly with 35mL bupivicaine followed by orthovisc. Patient tolerated the procedure well without immediate complications.  2. Right shoulder pain - 2/2 rotator cuff impingement.  Continue with laser treatment, home exercises.  Headache with nitro patches so stopped using.

## 2017-12-23 ENCOUNTER — Ambulatory Visit: Payer: Medicare HMO | Admitting: Family Medicine

## 2017-12-23 ENCOUNTER — Encounter: Payer: Self-pay | Admitting: Family Medicine

## 2017-12-23 DIAGNOSIS — M1712 Unilateral primary osteoarthritis, left knee: Secondary | ICD-10-CM

## 2017-12-23 NOTE — Progress Notes (Signed)
PCP: Mosie Lukes, MD  Subjective:   HPI: Patient is a 72 y.o. male here for left knee pain.  1/21: Patient reports he's had several months of medial left knee pain. Some occasional swelling of the knee as well. Worse with weather changes, standing a long time. No skin changes, numbness. Pain level currently is 1/10 and a soreness medially. Leg feels weak at times. Also interested in injection for right shoulder.  3/6: Patient reports injections for his right hip, shoulder, left knee each helped for a couple weeks. He has been doing home exercises some but the hip exercises seem to worsen his back. Right hip pain is 5/10 and sharp posterior. Worse with sitting, standing, walking. Right shoulder pain is 2/10 and sharp, difficulty sleeping. Some radiation down into all fingers. Worse with motions of shoulder such as reaching, overhead motions. Left knee worse with walking, anterior pain. No swelling. No skin changes. Taking mobic daily.  4/16: Patient reports his right hip is better at 2/10 level, doing home exercises. Right shoulder improved with laser treatment, down to 4/10 level. Couldn't tolerate nitro patches due to headaches. Initially couldn't do home exercises because they hurt worse - improved with laser and able to do now. Left knee worse since yesterday at 8/10 and sharp medial and anterior. Could hardly bear weight because of the pain. No obvious injury but thinks he may have twisted this. No skin changes, numbness.  4/23: Patient returns for second orthovisc injection. He states pain is down to 4/10 in knee, improved. No skin changes.  Past Medical History:  Diagnosis Date  . Allergic rhinitis   . Atypical chest pain 12/01/2014  . Back pain 01/12/2013  . COPD (chronic obstructive pulmonary disease) (HCC)    FeV1 64%-2007  . Emphysema   . GERD (gastroesophageal reflux disease)   . History of lung abscess    bronchiectasis with RMLandRLL ersection  -1996- Dr Arlyce Dice  . Hordeolum externum (stye) 04/23/2015   Right eye  . Hypertension   . Impaired vision    glasses  . Osteoarthritis    hands and knees  . Sinusitis, acute 04/23/2015    Current Outpatient Medications on File Prior to Visit  Medication Sig Dispense Refill  . albuterol (PROVENTIL HFA;VENTOLIN HFA) 108 (90 Base) MCG/ACT inhaler Inhale 2 puffs into the lungs every 6 (six) hours as needed for wheezing or shortness of breath. 3 Inhaler 3  . amLODipine (NORVASC) 5 MG tablet Take 1 tablet (5 mg total) by mouth daily. 90 tablet 2  . aspirin-acetaminophen-caffeine (EXCEDRIN MIGRAINE) 983-382-50 MG per tablet Take 2 tablets by mouth every 6 (six) hours as needed (arthritis pain).     . calcium elemental as carbonate (TUMS ULTRA 1000) 400 MG chewable tablet Chew 1,000 mg by mouth 2 (two) times daily.    . Capsaicin-Menthol (SALONPAS GEL EX) Apply 1 application topically daily as needed (arthritis pain).    . fluticasone (FLONASE) 50 MCG/ACT nasal spray USE 2 SPRAYS IN EACH NOSTRIL ONE TIME DAILY 48 g 1  . Glucosamine-Chondroitin 750-600 MG TABS Take 1 tablet by mouth 2 (two) times daily.    . Guaifenesin (MUCINEX MAXIMUM STRENGTH) 1200 MG TB12 Take 600 mg by mouth 2 (two) times daily.    . imiquimod (ALDARA) 5 % cream   2  . loratadine (CLARITIN) 10 MG tablet Take 10 mg by mouth daily.    . meloxicam (MOBIC) 15 MG tablet TAKE 1 TABLET DAILY WITH FOOD AS NEEDED FOR PAIN 90  tablet 1  . Menthol (ROBITUSSIN COUGH DROPS MT) Use as directed 1 lozenge in the mouth or throat 4 (four) times daily as needed (cough).     . Menthol, Topical Analgesic, (BLUE-EMU MAXIMUM STRENGTH EX) Apply 1 application topically daily as needed (arthritis pain).    . montelukast (SINGULAIR) 10 MG tablet Take 1 tablet (10 mg total) by mouth daily. 90 tablet 3  . Multiple Vitamin (MULTIVITAMIN WITH MINERALS) TABS tablet Take 1 tablet by mouth on Monday, Wednesday, Friday with supper     .  Neomy-Bacit-Polymyx-Pramoxine (TRIPLE ANTIBIOTIC PAIN RELIEF EX) Apply 1 application topically daily as needed (wound care).    . nitroGLYCERIN (NITRODUR - DOSED IN MG/24 HR) 0.2 mg/hr patch Apply 1/4th patch to affected shoulder, change daily 30 patch 1  . TRELEGY ELLIPTA 100-62.5-25 MCG/INH AEPB INHALE 1 PUFF INTO THE LUNGS DAILY. 180 each 3   No current facility-administered medications on file prior to visit.     Past Surgical History:  Procedure Laterality Date  . HERNIA REPAIR  10/2009  . left knee    . lower back surgery  09/2008   06/2009  . LUNG SURGERY     RML andRUL removed due to bleeding and bronchiectasis and lung abcess  . NECK SURGERY  05/2008  . right foot surgery      No Known Allergies  Social History   Socioeconomic History  . Marital status: Married    Spouse name: Not on file  . Number of children: Not on file  . Years of education: Not on file  . Highest education level: Not on file  Occupational History  . Occupation: retired Music therapist  Social Needs  . Financial resource strain: Not on file  . Food insecurity:    Worry: Not on file    Inability: Not on file  . Transportation needs:    Medical: Not on file    Non-medical: Not on file  Tobacco Use  . Smoking status: Former Smoker    Packs/day: 1.50    Years: 38.00    Pack years: 57.00    Types: Cigarettes    Last attempt to quit: 09/03/2000    Years since quitting: 17.3  . Smokeless tobacco: Never Used  Substance and Sexual Activity  . Alcohol use: Yes    Alcohol/week: 1.2 - 1.8 oz    Types: 2 - 3 Cans of beer per week  . Drug use: No  . Sexual activity: Never  Lifestyle  . Physical activity:    Days per week: Not on file    Minutes per session: Not on file  . Stress: Not on file  Relationships  . Social connections:    Talks on phone: Not on file    Gets together: Not on file    Attends religious service: Not on file    Active member of club or organization: Not on file     Attends meetings of clubs or organizations: Not on file    Relationship status: Not on file  . Intimate partner violence:    Fear of current or ex partner: Not on file    Emotionally abused: Not on file    Physically abused: Not on file    Forced sexual activity: Not on file  Other Topics Concern  . Not on file  Social History Narrative   Retired Music therapist   Patient states former smoker. 1 1/2 ppd x 38 yrs  Quit in Jan 2002  Married - 2 weeks (4th marriage)   divorced,  remarried 84-2004 (lost wife to lung ca),  remarried (divorced),    1 son  - 19 [name])   Alcohol use-yes (2-3 beers per week)            Family History  Problem Relation Age of Onset  . Prostate cancer Father        father died prostate ca  . Coronary artery disease Unknown        1st degree relative<60  . Stroke Unknown        1st degree relative<50  . Heart failure Mother        age 29  . Colon cancer Paternal Grandmother   . Obstructive Sleep Apnea Brother   . Obesity Brother     BP 129/82   Pulse 85   Ht 5\' 7"  (1.702 m)   Wt 150 lb (68 kg)   BMI 23.49 kg/m   Review of Systems: See HPI above.     Objective:  Physical Exam:  Exam not repeated today. Gen: NAD, comfortable in exam room  Right shoulder: Scapular winging.  No swelling, ecchymoses.  No gross deformity. No TTP. FROM with painful arc. Positive Hawkins, Neers. Negative Yergasons. Strength 5/5 with empty can and resisted internal/external rotation. NV intact distally.  Left knee: No gross deformity, ecchymoses, swelling. Mild TTP medial joint line. FROM with 5/5 strength. Negative ant/post drawers. Negative valgus/varus testing. Negative lachmanns. Negative mcmurrays, apleys, patellar apprehension. NV intact distally.   Assessment & Plan:  1. Left knee pain - 2/2 arthritis.  S/p partial meniscectomy in 2002.  Second orthovisc given today - f/u in 1 week for third injection.  After informed written consent  timeout was performed, patient was seated on exam table. Left knee was prepped with alcohol swab and utilizing anteromedial approach, patient's left knee was injected intraarticularly with 42mL bupivicaine followed by orthovisc. Patient tolerated the procedure well without immediate complications.

## 2017-12-23 NOTE — Assessment & Plan Note (Signed)
S/p partial meniscectomy in 2002.  Second orthovisc given today - f/u in 1 week for third injection.  After informed written consent timeout was performed, patient was seated on exam table. Left knee was prepped with alcohol swab and utilizing anteromedial approach, patient's left knee was injected intraarticularly with 66mL bupivicaine followed by orthovisc. Patient tolerated the procedure well without immediate complications.

## 2017-12-30 ENCOUNTER — Encounter: Payer: Self-pay | Admitting: Family Medicine

## 2017-12-30 ENCOUNTER — Ambulatory Visit: Payer: Medicare HMO | Admitting: Family Medicine

## 2017-12-30 DIAGNOSIS — M1712 Unilateral primary osteoarthritis, left knee: Secondary | ICD-10-CM

## 2017-12-30 NOTE — Assessment & Plan Note (Signed)
S/p partial meniscectomy in 2002.  Third orthovisc given today - he would like to hold fourth injection for now.  F/u prn.  After informed written consent timeout was performed, patient was seated on exam table. Left knee was prepped with alcohol swab and utilizing anteromedial approach, patient's left knee was injected intraarticularly with 68mL bupivicaine followed by orthovisc. Patient tolerated the procedure well without immediate complications.

## 2017-12-30 NOTE — Progress Notes (Signed)
PCP: Mosie Lukes, MD  Subjective:   HPI: Patient is a 72 y.o. male here for left knee pain.  1/21: Patient reports he's had several months of medial left knee pain. Some occasional swelling of the knee as well. Worse with weather changes, standing a long time. No skin changes, numbness. Pain level currently is 1/10 and a soreness medially. Leg feels weak at times. Also interested in injection for right shoulder.  3/6: Patient reports injections for his right hip, shoulder, left knee each helped for a couple weeks. He has been doing home exercises some but the hip exercises seem to worsen his back. Right hip pain is 5/10 and sharp posterior. Worse with sitting, standing, walking. Right shoulder pain is 2/10 and sharp, difficulty sleeping. Some radiation down into all fingers. Worse with motions of shoulder such as reaching, overhead motions. Left knee worse with walking, anterior pain. No swelling. No skin changes. Taking mobic daily.  4/16: Patient reports his right hip is better at 2/10 level, doing home exercises. Right shoulder improved with laser treatment, down to 4/10 level. Couldn't tolerate nitro patches due to headaches. Initially couldn't do home exercises because they hurt worse - improved with laser and able to do now. Left knee worse since yesterday at 8/10 and sharp medial and anterior. Could hardly bear weight because of the pain. No obvious injury but thinks he may have twisted this. No skin changes, numbness.  4/23: Patient returns for second orthovisc injection. He states pain is down to 4/10 in knee, improved. No skin changes.  4/30: Patient reports he's doing better. Pain level down to 3/10 level. Would like to hold fourth injection. No skin changes.  Past Medical History:  Diagnosis Date  . Allergic rhinitis   . Atypical chest pain 12/01/2014  . Back pain 01/12/2013  . COPD (chronic obstructive pulmonary disease) (HCC)    FeV1 64%-2007   . Emphysema   . GERD (gastroesophageal reflux disease)   . History of lung abscess    bronchiectasis with RMLandRLL ersection -1996- Dr Arlyce Dice  . Hordeolum externum (stye) 04/23/2015   Right eye  . Hypertension   . Impaired vision    glasses  . Osteoarthritis    hands and knees  . Sinusitis, acute 04/23/2015    Current Outpatient Medications on File Prior to Visit  Medication Sig Dispense Refill  . albuterol (PROVENTIL HFA;VENTOLIN HFA) 108 (90 Base) MCG/ACT inhaler Inhale 2 puffs into the lungs every 6 (six) hours as needed for wheezing or shortness of breath. 3 Inhaler 3  . amLODipine (NORVASC) 5 MG tablet Take 1 tablet (5 mg total) by mouth daily. 90 tablet 2  . aspirin-acetaminophen-caffeine (EXCEDRIN MIGRAINE) 761-607-37 MG per tablet Take 2 tablets by mouth every 6 (six) hours as needed (arthritis pain).     . calcium elemental as carbonate (TUMS ULTRA 1000) 400 MG chewable tablet Chew 1,000 mg by mouth 2 (two) times daily.    . Capsaicin-Menthol (SALONPAS GEL EX) Apply 1 application topically daily as needed (arthritis pain).    . fluticasone (FLONASE) 50 MCG/ACT nasal spray USE 2 SPRAYS IN EACH NOSTRIL ONE TIME DAILY 48 g 1  . Glucosamine-Chondroitin 750-600 MG TABS Take 1 tablet by mouth 2 (two) times daily.    . Guaifenesin (MUCINEX MAXIMUM STRENGTH) 1200 MG TB12 Take 600 mg by mouth 2 (two) times daily.    . imiquimod (ALDARA) 5 % cream   2  . loratadine (CLARITIN) 10 MG tablet Take 10 mg by  mouth daily.    . meloxicam (MOBIC) 15 MG tablet TAKE 1 TABLET DAILY WITH FOOD AS NEEDED FOR PAIN 90 tablet 1  . Menthol (ROBITUSSIN COUGH DROPS MT) Use as directed 1 lozenge in the mouth or throat 4 (four) times daily as needed (cough).     . Menthol, Topical Analgesic, (BLUE-EMU MAXIMUM STRENGTH EX) Apply 1 application topically daily as needed (arthritis pain).    . montelukast (SINGULAIR) 10 MG tablet Take 1 tablet (10 mg total) by mouth daily. 90 tablet 3  . Multiple Vitamin  (MULTIVITAMIN WITH MINERALS) TABS tablet Take 1 tablet by mouth on Monday, Wednesday, Friday with supper     . Neomy-Bacit-Polymyx-Pramoxine (TRIPLE ANTIBIOTIC PAIN RELIEF EX) Apply 1 application topically daily as needed (wound care).    . nitroGLYCERIN (NITRODUR - DOSED IN MG/24 HR) 0.2 mg/hr patch Apply 1/4th patch to affected shoulder, change daily 30 patch 1  . TRELEGY ELLIPTA 100-62.5-25 MCG/INH AEPB INHALE 1 PUFF INTO THE LUNGS DAILY. 180 each 3   No current facility-administered medications on file prior to visit.     Past Surgical History:  Procedure Laterality Date  . HERNIA REPAIR  10/2009  . left knee    . lower back surgery  09/2008   06/2009  . LUNG SURGERY     RML andRUL removed due to bleeding and bronchiectasis and lung abcess  . NECK SURGERY  05/2008  . right foot surgery      No Known Allergies  Social History   Socioeconomic History  . Marital status: Married    Spouse name: Not on file  . Number of children: Not on file  . Years of education: Not on file  . Highest education level: Not on file  Occupational History  . Occupation: retired Music therapist  Social Needs  . Financial resource strain: Not on file  . Food insecurity:    Worry: Not on file    Inability: Not on file  . Transportation needs:    Medical: Not on file    Non-medical: Not on file  Tobacco Use  . Smoking status: Former Smoker    Packs/day: 1.50    Years: 38.00    Pack years: 57.00    Types: Cigarettes    Last attempt to quit: 09/03/2000    Years since quitting: 17.3  . Smokeless tobacco: Never Used  Substance and Sexual Activity  . Alcohol use: Yes    Alcohol/week: 1.2 - 1.8 oz    Types: 2 - 3 Cans of beer per week  . Drug use: No  . Sexual activity: Never  Lifestyle  . Physical activity:    Days per week: Not on file    Minutes per session: Not on file  . Stress: Not on file  Relationships  . Social connections:    Talks on phone: Not on file    Gets together:  Not on file    Attends religious service: Not on file    Active member of club or organization: Not on file    Attends meetings of clubs or organizations: Not on file    Relationship status: Not on file  . Intimate partner violence:    Fear of current or ex partner: Not on file    Emotionally abused: Not on file    Physically abused: Not on file    Forced sexual activity: Not on file  Other Topics Concern  . Not on file  Social History Narrative   Retired  water plant operator   Patient states former smoker. 1 1/2 ppd x 38 yrs  Quit in Jan 2002   Married - 2 weeks (4th marriage)   divorced,  remarried 84-2004 (lost wife to lung ca),  remarried (divorced),    1 son  - 80 [name])   Alcohol use-yes (2-3 beers per week)            Family History  Problem Relation Age of Onset  . Prostate cancer Father        father died prostate ca  . Coronary artery disease Unknown        1st degree relative<60  . Stroke Unknown        1st degree relative<50  . Heart failure Mother        age 20  . Colon cancer Paternal Grandmother   . Obstructive Sleep Apnea Brother   . Obesity Brother     BP 128/77   Pulse 86   Ht 5\' 7"  (1.702 m)   Wt 150 lb (68 kg)   BMI 23.49 kg/m   Review of Systems: See HPI above.     Objective:  Physical Exam:  Exam not repeated today. Gen: NAD, comfortable in exam room  Right shoulder: Scapular winging.  No swelling, ecchymoses.  No gross deformity. No TTP. FROM with painful arc. Positive Hawkins, Neers. Negative Yergasons. Strength 5/5 with empty can and resisted internal/external rotation. NV intact distally.  Left knee: No gross deformity, ecchymoses, swelling. Mild TTP medial joint line. FROM with 5/5 strength. Negative ant/post drawers. Negative valgus/varus testing. Negative lachmanns. Negative mcmurrays, apleys, patellar apprehension. NV intact distally.   Assessment & Plan:  1. Left knee pain - 2/2 arthritis.  S/p partial  meniscectomy in 2002.  Third orthovisc given today - he would like to hold fourth injection for now.  F/u prn.  After informed written consent timeout was performed, patient was seated on exam table. Left knee was prepped with alcohol swab and utilizing anteromedial approach, patient's left knee was injected intraarticularly with 71mL bupivicaine followed by orthovisc. Patient tolerated the procedure well without immediate complications.

## 2018-05-04 NOTE — Progress Notes (Signed)
@Patient  ID: [name], male    DOB: 1946/04/22, 72 y.o.   MRN: [id number]  Chief Complaint  Patient presents with  . Acute Visit    sob, for 2 weeks+     Referring provider: Mosie Lukes, MD  HPI:  72 year old male former smoker followed in our office for COPD GOLD II C (2014 - spirometry, FEV1 53 percent, ratio 54)  PMH: GERD, AR, COPD, Lung Abscess in 1996 s/p RML resection Smoker/ Smoking History: Former Smoker. Quit in 2002. 57 pack years.  Maintenance:  Trelegy Ellipta  Pt of: Dr. Elsworth Soho  Recent [place] [facility] Encounters:   10/01/17 - OV - TP  52mo follow up for COPD. Pt reports doing well. Pt had a COPD flare earlier this month. UTD with vaccines. Very active outdoors.  Plan: continue trelegy, call in 2 weeks if symptoms are improving, xopenex in office, cont singulair    05/05/2018  - Visit   72 year old patient presents today for acute visit for COPD, potential bronchitis.  Patient reports that for the last 2 to 3 weeks had worsening shortness of breath, cough with yellow productive mucus.  Patient reports adherence to trilogy Ellipta.  Patient reports she is been doing nasal rinses daily, Flonase daily, Claritin daily, and Zyrtec daily.  Patient reports that prior to this episode patient was feeling the best he had felt in years.  Patient reports that he still walks his dog about a mile daily and still kayaks often.  Patient is very active.  Patient declines any other exacerbations since last office visit here.  Patient reports his last time he was treated with antibiotics and steroids was in January/2019.  Patient is requesting samples for Trelegy Ellipta.  Patient reports he is now in the donut hole.  Patient would also like a flu vaccine today.   MMRC - Breathlessness Score 2 - on level ground, I walk slower than people of the same age because of breathlessness, or have to stop for breathe when walking to my own pace    Tests:    01/03/2015-echocardiogram-LV ejection fraction 55 to 62%, grade 1 diastolic dysfunction 1308 - spirometry-72 year old male former patient FEV1 53 percent, ratio 54 (per previous office notes)      Chart Review:     Specialty Problems      Pulmonary Problems   Allergic rhinitis    Qualifier: Diagnosis of  By: Joya Gaskins MD, Burnett Harry       COPD (chronic obstructive pulmonary disease) ([place])    Gold stage C. COPD Spirometry 11/12/2012: FEV1 53% predicted  FEV1/ FVC ratio 54% predicted       COPD exacerbation (HCC)      No Known Allergies  Immunization History  Administered Date(s) Administered  . Influenza Split 06/02/2012  . Influenza Whole 05/15/2010, 05/02/2011, 05/18/2013  . Influenza, High Dose Seasonal PF 04/20/2017  . Influenza,inj,Quad PF,6+ Mos 05/14/2014, 05/25/2015  . Influenza-Unspecified 05/15/2016  . Pneumococcal Conjugate-13 05/20/2013, 05/15/2016  . Pneumococcal Polysaccharide-23 07/03/2001, 05/02/2011  . Td 05/15/2004  . Tdap 02/17/2014  . Zoster 07/16/2011   Flu vaccine to be administered today  Past Medical History:  Diagnosis Date  . Allergic rhinitis   . Atypical chest pain 12/01/2014  . Back pain 01/12/2013  . COPD (chronic obstructive pulmonary disease) (HCC)    FeV1 64%-2007  . Emphysema   . GERD (gastroesophageal reflux disease)   . History of lung abscess    bronchiectasis with RMLandRLL ersection -1996- Dr Arlyce Dice  .  Hordeolum externum (stye) 04/23/2015   Right eye  . Hypertension   . Impaired vision    glasses  . Osteoarthritis    hands and knees  . Sinusitis, acute 04/23/2015    Tobacco History: Social History   Tobacco Use  Smoking Status Former Smoker  . Packs/day: 1.50  . Years: 38.00  . Pack years: 57.00  . Types: Cigarettes  . Last attempt to quit: 09/03/2000  . Years since quitting: 17.6  Smokeless Tobacco Never Used   Counseling given: Not Answered Continue not smoking  Outpatient Encounter Medications as of  05/05/2018  Medication Sig  . albuterol (PROVENTIL HFA;VENTOLIN HFA) 108 (90 Base) MCG/ACT inhaler Inhale 2 puffs into the lungs every 6 (six) hours as needed for wheezing or shortness of breath.  Marland Kitchen amLODipine (NORVASC) 5 MG tablet Take 1 tablet (5 mg total) by mouth daily.  Marland Kitchen aspirin-acetaminophen-caffeine (EXCEDRIN MIGRAINE) 250-250-65 MG per tablet Take 2 tablets by mouth every 6 (six) hours as needed (arthritis pain).   . calcium elemental as carbonate (TUMS ULTRA 1000) 400 MG chewable tablet Chew 1,000 mg by mouth 2 (two) times daily.  . Capsaicin-Menthol (SALONPAS GEL EX) Apply 1 application topically daily as needed (arthritis pain).  . fluticasone (FLONASE) 50 MCG/ACT nasal spray USE 2 SPRAYS IN EACH NOSTRIL ONE TIME DAILY  . Glucosamine-Chondroitin 750-600 MG TABS Take 1 tablet by mouth 2 (two) times daily.  . Guaifenesin (MUCINEX MAXIMUM STRENGTH) 1200 MG TB12 Take 600 mg by mouth 2 (two) times daily.  . imiquimod (ALDARA) 5 % cream   . loratadine (CLARITIN) 10 MG tablet Take 10 mg by mouth daily.  . meloxicam (MOBIC) 15 MG tablet TAKE 1 TABLET DAILY WITH FOOD AS NEEDED FOR PAIN  . Menthol (ROBITUSSIN COUGH DROPS MT) Use as directed 1 lozenge in the mouth or throat 4 (four) times daily as needed (cough).   . Menthol, Topical Analgesic, (BLUE-EMU MAXIMUM STRENGTH EX) Apply 1 application topically daily as needed (arthritis pain).  . montelukast (SINGULAIR) 10 MG tablet Take 1 tablet (10 mg total) by mouth daily.  . Multiple Vitamin (MULTIVITAMIN WITH MINERALS) TABS tablet Take 1 tablet by mouth on Monday, Wednesday, Friday with supper   . Neomy-Bacit-Polymyx-Pramoxine (TRIPLE ANTIBIOTIC PAIN RELIEF EX) Apply 1 application topically daily as needed (wound care).  . nitroGLYCERIN (NITRODUR - DOSED IN MG/24 HR) 0.2 mg/hr patch Apply 1/4th patch to affected shoulder, change daily  . TRELEGY ELLIPTA 100-62.5-25 MCG/INH AEPB INHALE 1 PUFF INTO THE LUNGS DAILY.  Marland Kitchen amoxicillin-clavulanate  (AUGMENTIN) 875-125 MG tablet Take 1 tablet by mouth 2 (two) times daily.  . Fluticasone-Umeclidin-Vilant (TRELEGY ELLIPTA) 100-62.5-25 MCG/INH AEPB Inhale 1 puff into the lungs daily.  . Fluticasone-Umeclidin-Vilant (TRELEGY ELLIPTA) 100-62.5-25 MCG/INH AEPB Inhale 1 puff into the lungs once for 1 dose.  . predniSONE (DELTASONE) 20 MG tablet Take two tablets (40mg  total) daily for the next five days. Take with food in the morning.   No facility-administered encounter medications on file as of 05/05/2018.      Review of Systems  Review of Systems  Constitutional: Positive for fatigue (chronic ). Negative for activity change, chills, fever and unexpected weight change.  HENT: Positive for congestion and postnasal drip.   Respiratory: Positive for cough (yellow mucous ), shortness of breath and wheezing.   Cardiovascular: Negative for chest pain and palpitations.  Gastrointestinal: Negative for constipation, diarrhea, nausea and vomiting.  Genitourinary: Negative for hematuria and urgency.  Musculoskeletal: Negative for arthralgias.  Skin: Negative for color  change.  Neurological: Negative for dizziness and headaches.  Psychiatric/Behavioral: Negative for dysphoric mood. The patient is not nervous/anxious.   All other systems reviewed and are negative.    Physical Exam  BP (!) 156/80 (BP Location: Right Arm, Cuff Size: Normal)   Pulse 83   Ht 5\' 7"  (1.702 m)   Wt 149 lb 9.6 oz (67.9 kg)   SpO2 96%   BMI 23.43 kg/m   Rechecked blood pressure: 136/78   Wt Readings from Last 5 Encounters:  05/05/18 149 lb 9.6 oz (67.9 kg)  12/30/17 150 lb (68 kg)  12/23/17 150 lb (68 kg)  12/16/17 150 lb (68 kg)  11/05/17 150 lb (68 kg)     Physical Exam  Constitutional: He is oriented to person, place, and time and well-developed, well-nourished, and in no distress. No distress.  HENT:  Head: Normocephalic and atraumatic.  Right Ear: Hearing, tympanic membrane, external ear and ear canal  normal.  Left Ear: Hearing, tympanic membrane, external ear and ear canal normal.  Nose: Nose normal.  Mouth/Throat: Uvula is midline and oropharynx is clear and moist. No oropharyngeal exudate.  Eyes: Pupils are equal, round, and reactive to light.  Neck: Normal range of motion. Neck supple. No JVD present.  Cardiovascular: Normal rate, regular rhythm and normal heart sounds.  Pulmonary/Chest: Effort normal. No accessory muscle usage. No respiratory distress. He has no decreased breath sounds. He has wheezes. He has rhonchi.  Abdominal: Soft. Bowel sounds are normal. There is no tenderness.  Musculoskeletal: Normal range of motion. He exhibits no edema.  Lymphadenopathy:    He has no cervical adenopathy.  Neurological: He is alert and oriented to person, place, and time. Gait normal.  Skin: Skin is warm and dry. He is not diaphoretic. No erythema.  Psychiatric: Mood, memory, affect and judgment normal.  Nursing note and vitals reviewed.     Lab Results:  CBC    Component Value Date/Time   WBC 7.5 09/15/2017 1148   RBC 4.52 09/15/2017 1148   HGB 14.0 09/15/2017 1148   HCT 41.8 09/15/2017 1148   PLT 340.0 09/15/2017 1148   MCV 92.4 09/15/2017 1148   MCH 31.0 09/30/2016 0535   MCHC 33.4 09/15/2017 1148   RDW 13.2 09/15/2017 1148   LYMPHSABS 1.0 10/11/2016 1209   MONOABS 0.3 10/11/2016 1209   EOSABS 0.0 10/11/2016 1209   BASOSABS 0.0 10/11/2016 1209    BMET    Component Value Date/Time   NA 138 09/15/2017 1148   K 4.6 09/15/2017 1148   CL 103 09/15/2017 1148   CO2 29 09/15/2017 1148   GLUCOSE 79 09/15/2017 1148   BUN 24 (H) 09/15/2017 1148   CREATININE 0.75 09/15/2017 1148   CREATININE 0.75 07/22/2014 1031   CALCIUM 9.4 09/15/2017 1148   GFRNONAA >60 09/30/2016 0535   GFRNONAA >89 07/22/2014 1031   GFRAA >60 09/30/2016 0535   GFRAA >89 07/22/2014 1031    BNP No results found for: BNP  ProBNP    Component Value Date/Time   PROBNP 18.0 07/20/2013 1403     Imaging: No results found.    Assessment & Plan:   Pleasant 72 year old patient seen office visit today with COPD exacerbation.  Will get chest x-ray, Depo-Medrol injection in office, prednisone prescription, Augmentin.  Patient follow-up in 2 months with pulmonary function testing.  Patient would like flu vaccine today.  High-dose flu vaccine to be administered today.   COPD exacerbation ([place]) Chest Xray today    Depo Medrol  80 Injection today   High dose flu vaccine today   Pulmonary Function test at next visit   2 month follow up visit   Prednisone 40mg  tablet  >>> 40 mg daily five days  >>> take this daily in the morning  >>>take with food  >>>start this on 05/06/18  Augmentin >>> Take 1 875-125 mg tablet every 12 hours for the next 7 days >>> Take with food   Trelegy Ellipta  >>> 1 puff daily in the morning >>>rinse mouth out after use  >>> This inhaler contains 3 medications that help manage her respiratory status, contact our office if you cannot afford this medication or unable to remain on this medication >>>sample provided today  >>>coupoin provided today   Continue Claritin daily Continue Zyrtec daily Continue Flonase daily   COPD (chronic obstructive pulmonary disease) ([place]) Continue trelegy Ellipta >>>Samples and coupon provided today  Flu vaccine provided today  Pulmonary function test ordered to be completed by next office visit in 2 months  Allergic rhinitis  Continue Claritin daily Continue Zyrtec daily Continue Flonase daily   Preventative health care Flu vaccine today     Lauraine Rinne, NP 05/05/2018

## 2018-05-05 ENCOUNTER — Encounter: Payer: Self-pay | Admitting: Pulmonary Disease

## 2018-05-05 ENCOUNTER — Ambulatory Visit: Payer: Medicare HMO | Admitting: Pulmonary Disease

## 2018-05-05 ENCOUNTER — Ambulatory Visit (INDEPENDENT_AMBULATORY_CARE_PROVIDER_SITE_OTHER)
Admission: RE | Admit: 2018-05-05 | Discharge: 2018-05-05 | Disposition: A | Discharge status: Home / Self Care | Payer: Medicare HMO | Source: Ambulatory Visit | Attending: Pulmonary Disease | Admitting: Pulmonary Disease

## 2018-05-05 VITALS — BP 136/78 | HR 83 | Ht 67.0 in | Wt 149.6 lb

## 2018-05-05 DIAGNOSIS — J449 Chronic obstructive pulmonary disease, unspecified: Secondary | ICD-10-CM

## 2018-05-05 DIAGNOSIS — Z0000 Encounter for general adult medical examination without abnormal findings: Secondary | ICD-10-CM

## 2018-05-05 DIAGNOSIS — Z23 Encounter for immunization: Secondary | ICD-10-CM

## 2018-05-05 DIAGNOSIS — J441 Chronic obstructive pulmonary disease with (acute) exacerbation: Secondary | ICD-10-CM

## 2018-05-05 DIAGNOSIS — J301 Allergic rhinitis due to pollen: Secondary | ICD-10-CM

## 2018-05-05 MED ORDER — AMOXICILLIN-POT CLAVULANATE 875-125 MG PO TABS
1.0000 | ORAL_TABLET | Freq: Two times a day (BID) | ORAL | 0 refills | Status: DC
Start: 2018-05-05 — End: 2018-06-09

## 2018-05-05 MED ORDER — FLUTICASONE-UMECLIDIN-VILANT 100-62.5-25 MCG/INH IN AEPB: 1.0000 | INHALATION_SPRAY | Freq: Once | RESPIRATORY_TRACT | 0 refills | Status: AC

## 2018-05-05 MED ORDER — PREDNISONE 20 MG PO TABS: ORAL_TABLET | ORAL | 0 refills | Status: DC

## 2018-05-05 MED ORDER — FLUTICASONE-UMECLIDIN-VILANT 100-62.5-25 MCG/INH IN AEPB: 1.0000 | INHALATION_SPRAY | Freq: Every day | RESPIRATORY_TRACT | 0 refills | Status: DC

## 2018-05-05 MED ORDER — METHYLPREDNISOLONE ACETATE 80 MG/ML IJ SUSP
80.0000 mg | Freq: Once | INTRAMUSCULAR | Status: AC
  Administered 2018-05-05: 80 mg via INTRAMUSCULAR

## 2018-05-05 NOTE — Assessment & Plan Note (Signed)
Flu vaccine today 

## 2018-05-05 NOTE — Progress Notes (Signed)
Your chest x-ray results of come back.  Showing no acute changes.  No plan of care changes at this time.  Keep follow-up appointment.    Follow-up with our office if symptoms worsen or you do not feel like you are improving under her current regimen.  It was a pleasure taking care of you,  Lesieli Bresee, FNP 

## 2018-05-05 NOTE — Patient Instructions (Addendum)
Chest Xray today    Depo Medrol 80 Injection today   High dose flu vaccine today   Pulmonary Function test at next visit   2 month follow up visit   Prednisone 40mg  tablet  >>> 40 mg daily five days  >>> take this daily in the morning  >>>take with food  >>>start this on 05/06/18  Augmentin >>> Take 1 875-125 mg tablet every 12 hours for the next 7 days >>> Take with food   Trelegy Ellipta  >>> 1 puff daily in the morning >>>rinse mouth out after use  >>> This inhaler contains 3 medications that help manage her respiratory status, contact our office if you cannot afford this medication or unable to remain on this medication >>>sample provided today  >>>coupoin provided today   Continue Claritin daily Continue Zyrtec daily Continue Flonase daily   Stay away from those rattle snakes!     It is flu season:   >>>Remember to be washing your hands regularly, using hand sanitizer, be careful to use around herself with has contact with people who are sick will increase her chances of getting sick yourself. >>> Best ways to protect herself from the flu: Receive the yearly flu vaccine, practice good hand hygiene washing with soap and also using hand sanitizer when available, eat a nutritious meals, get adequate rest, hydrate appropriately   Please contact the office if your symptoms worsen or you have concerns that you are not improving.   Thank you for choosing [facility] for your healthcare, and for allowing [place] to partner with you on your healthcare journey. I am thankful to be able to provide care to you today.   Wyn Quaker FNP-C      Chronic Obstructive Pulmonary Disease Chronic obstructive pulmonary disease (COPD) is a long-term (chronic) lung problem. When you have COPD, it is hard for air to get in and out of your lungs. The way your lungs work will never return to normal. Usually the condition gets worse over time. There are things you can do to keep  yourself as healthy as possible. Your doctor may treat your condition with:  Medicines.  Quitting smoking, if you smoke.  Rehabilitation. This may involve a team of specialists.  Oxygen.  Exercise and changes to your diet.  Lung surgery.  Comfort measures (palliative care).  Follow these instructions at home: Medicines  Take over-the-counter and prescription medicines only as told by your doctor.  Talk to your doctor before taking any cough or allergy medicines. You may need to avoid medicines that cause your lungs to be dry. Lifestyle  If you smoke, stop. Smoking makes the problem worse. If you need help quitting, ask your doctor.  Avoid being around things that make your breathing worse. This may include smoke, chemicals, and fumes.  Stay active, but remember to also rest.  Learn and use tips on how to relax.  Make sure you get enough sleep. Most adults need at least 7 hours a night.  Eat healthy foods. Eat smaller meals more often. Rest before meals. Controlled breathing  Learn and use tips on how to control your breathing as told by your doctor. Try: ? Breathing in (inhaling) through your nose for 1 second. Then, pucker your lips and breath out (exhale) through your lips for 2 seconds. ? Putting one hand on your belly (abdomen). Breathe in slowly through your nose for 1 second. Your hand on your belly should move out. Pucker your lips and breathe  out slowly through your lips. Your hand on your belly should move in as you breathe out. Controlled coughing  Learn and use controlled coughing to clear mucus from your lungs. The steps are: 1. Lean your head a little forward. 2. Breathe in deeply. 3. Try to hold your breath for 3 seconds. 4. Keep your mouth slightly open while coughing 2 times. 5. Spit any mucus out into a tissue. 6. Rest and do the steps again 1 or 2 times as needed. General instructions  Make sure you get all the shots (vaccines) that your doctor  recommends. Ask your doctor about a flu shot and a pneumonia shot.  Use oxygen therapy and therapy to help improve your lungs (pulmonary rehabilitation) if told by your doctor. If you need home oxygen therapy, ask your doctor if you should buy a tool to measure your oxygen level (oximeter).  Make a COPD action plan with your doctor. This helps you know what to do if you feel worse than usual.  Manage any other conditions you have as told by your doctor.  Avoid going outside when it is very hot, cold, or humid.  Avoid people who have a sickness you can catch (contagious).  Keep all follow-up visits as told by your doctor. This is important. Contact a doctor if:  You cough up more mucus than usual.  There is a change in the color or thickness of the mucus.  It is harder to breathe than usual.  Your breathing is faster than usual.  You have trouble sleeping.  You need to use your medicines more often than usual.  You have trouble doing your normal activities such as getting dressed or walking around the house. Get help right away if:  You have shortness of breath while resting.  You have shortness of breath that stops you from: ? Being able to talk. ? Doing normal activities.  Your chest hurts for longer than 5 minutes.  Your skin color is more blue than usual.  Your pulse oximeter shows that you have low oxygen for longer than 5 minutes.  You have a fever.  You feel too tired to breathe normally. Summary  Chronic obstructive pulmonary disease (COPD) is a long-term lung problem.  The way your lungs work will never return to normal. Usually the condition gets worse over time. There are things you can do to keep yourself as healthy as possible.  Take over-the-counter and prescription medicines only as told by your doctor.  If you smoke, stop. Smoking makes the problem worse. This information is not intended to replace advice given to you by your health care provider.  Make sure you discuss any questions you have with your health care provider. Document Released: 02/05/2008 Document Revised: 01/25/2016 Document Reviewed: 04/15/2013 Elsevier Interactive Patient Education  2017 [place].    Influenza Virus Vaccine injection What is this medicine? INFLUENZA VIRUS VACCINE (in floo EN zuh VAHY ruhs vak SEEN) helps to reduce the risk of getting influenza also known as the flu. The vaccine only helps protect you against some strains of the flu. This medicine may be used for other purposes; ask your health care provider or pharmacist if you have questions. COMMON BRAND NAME(S): Afluria, Agriflu, Alfuria, FLUAD, Fluarix, Fluarix Quadrivalent, Flublok, Flublok Quadrivalent, FLUCELVAX, Flulaval, Fluvirin, Fluzone, Fluzone High-Dose, Fluzone Intradermal What should I tell my health care provider before I take this medicine? They need to know if you have any of these conditions: -bleeding disorder like hemophilia -fever  or infection -Guillain-Barre syndrome or other neurological problems -immune system problems -infection with the human immunodeficiency virus (HIV) or AIDS -low blood platelet counts -multiple sclerosis -an unusual or allergic reaction to influenza virus vaccine, latex, other medicines, foods, dyes, or preservatives. Different brands of vaccines contain different allergens. Some may contain latex or eggs. Talk to your doctor about your allergies to make sure that you get the right vaccine. -pregnant or trying to get pregnant -breast-feeding How should I use this medicine? This vaccine is for injection into a muscle or under the skin. It is given by a health care professional. A copy of Vaccine Information Statements will be given before each vaccination. Read this sheet carefully each time. The sheet may change frequently. Talk to your healthcare provider to see which vaccines are right for you. Some vaccines should not be used in all age  groups. Overdosage: If you think you have taken too much of this medicine contact a poison control center or emergency room at once. NOTE: This medicine is only for you. Do not share this medicine with others. What if I miss a dose? This does not apply. What may interact with this medicine? -chemotherapy or radiation therapy -medicines that lower your immune system like etanercept, anakinra, infliximab, and adalimumab -medicines that treat or prevent blood clots like warfarin -phenytoin -steroid medicines like prednisone or cortisone -theophylline -vaccines This list may not describe all possible interactions. Give your health care provider a list of all the medicines, herbs, non-prescription drugs, or dietary supplements you use. Also tell them if you smoke, drink alcohol, or use illegal drugs. Some items may interact with your medicine. What should I watch for while using this medicine? Report any side effects that do not go away within 3 days to your doctor or health care professional. Call your health care provider if any unusual symptoms occur within 6 weeks of receiving this vaccine. You may still catch the flu, but the illness is not usually as bad. You cannot get the flu from the vaccine. The vaccine will not protect against colds or other illnesses that may cause fever. The vaccine is needed every year. What side effects may I notice from receiving this medicine? Side effects that you should report to your doctor or health care professional as soon as possible: -allergic reactions like skin rash, itching or hives, swelling of the face, lips, or tongue Side effects that usually do not require medical attention (report to your doctor or health care professional if they continue or are bothersome): -fever -headache -muscle aches and pains -pain, tenderness, redness, or swelling at the injection site -tiredness This list may not describe all possible side effects. Call your doctor for  medical advice about side effects. You may report side effects to FDA at 1-800-FDA-1088. Where should I keep my medicine? The vaccine will be given by a health care professional in a clinic, pharmacy, doctor's office, or other health care setting. You will not be given vaccine doses to store at home. NOTE: This sheet is a summary. It may not cover all possible information. If you have questions about this medicine, talk to your doctor, pharmacist, or health care provider.  2018 Elsevier/Gold Standard (2015-03-10 10:07:28)

## 2018-05-05 NOTE — Assessment & Plan Note (Signed)
Chest Xray today    Depo Medrol 80 Injection today   High dose flu vaccine today   Pulmonary Function test at next visit   2 month follow up visit   Prednisone 40mg  tablet  >>> 40 mg daily five days  >>> take this daily in the morning  >>>take with food  >>>start this on 05/06/18  Augmentin >>> Take 1 875-125 mg tablet every 12 hours for the next 7 days >>> Take with food   Trelegy Ellipta  >>> 1 puff daily in the morning >>>rinse mouth out after use  >>> This inhaler contains 3 medications that help manage her respiratory status, contact our office if you cannot afford this medication or unable to remain on this medication >>>sample provided today  >>>coupoin provided today   Continue Claritin daily Continue Zyrtec daily Continue Flonase daily

## 2018-05-05 NOTE — Assessment & Plan Note (Signed)
Continue trelegy Ellipta >>>Samples and coupon provided today  Flu vaccine provided today  Pulmonary function test ordered to be completed by next office visit in 2 months

## 2018-05-05 NOTE — Addendum Note (Signed)
Addended by: Mathis Bud on: 05/05/2018 10:08 AM   Modules accepted: Orders

## 2018-05-05 NOTE — Assessment & Plan Note (Signed)
  Continue Claritin daily Continue Zyrtec daily Continue Flonase daily

## 2018-05-05 NOTE — Progress Notes (Signed)
Was able to talk to patient regarding patient's results.  They verbalized an understanding of what was discussed. No further questions at this time. 

## 2018-05-07 ENCOUNTER — Telehealth: Payer: Self-pay | Admitting: Pulmonary Disease

## 2018-05-07 NOTE — Telephone Encounter (Signed)
Spoke with [name], he states he may not qualify for patient assistance and he wanted to thank [place] for trying. He states the coupon really helped him and I advised him to call us if there is anything else we can do for him. [name] understood and nothing further is needed. Martie Lee, do you have any other suggestions?

## 2018-05-07 NOTE — Telephone Encounter (Signed)
We have coordinated with the Trelegy Ellipta rep Sharee Pimple that if patient needs additional samples/supplies we can reach out to her.  Patient to keep follow-up with our office.  Thanks for working with this patient.  Wyn Quaker FNP

## 2018-05-07 NOTE — Telephone Encounter (Signed)
I did advise pt to let us know if he needs Trelegy to let us know so he does not run out. Pt understood and will close this encounter.

## 2018-05-19 NOTE — Progress Notes (Signed)
@Patient  ID: [name], male    DOB: 04/25/1946, 72 y.o.   MRN: [id number]  Chief Complaint  Patient presents with  . Acute Visit    sinus pain, congestion    Referring provider: Mosie Lukes, MD  HPI:  72 year old male former smoker followed in our office for COPD GOLD II C (2014 - spirometry, FEV1 53 percent, ratio 54)  PMH: GERD, AR, COPD, Lung Abscess in 1996 s/p RML resection Smoker/ Smoking History: Former Smoker. Quit in 2002. 57 pack years.  Maintenance:  Trelegy Ellipta  Pt of: Dr. Elsworth Soho   Recent [facility] Encounters:   05/05/2018  - Visit - BM 72 year old patient presenting today for acute visit and potential bronchitis.  Patient reports that for the last 2 to 3 weeks he has had worsening shortness of breath and yellow productive mucus.  Patient reports adherence to trelegy Ellipta.  Patient has been doing nasal saline rinses daily, Flonase daily, Claritin daily, and Zyrtec daily.  Prior to this episode patient reports it was the feeling the best he had in years.  Patient reports no recent exacerbations or antibiotic use.  Patient is requesting samples of Trelegy Ellipta today.  Patient would also like flu vaccine.  mMRC today is 2. Plan: Prednisone, Augmentin, chest x-ray, follow-up in 2 months, continue trelegy, samples provided today, PFT ordered   05/20/2018  - Visit   72 year old patient presenting today for acute visit.  Patient reports that he still having ongoing sinus pain and tenderness.  Patient also reporting increased congestion.  Patient producing yellow to brown mucus when coughing.  Patient also having thick drainage from nose.  Patient unable to describe nasal drainage with color.  Patient is concerned that this may affect his breathing overall so he wanted to present office today.   Patient does report that he was outside for extended periods of time over the past couple weeks and he reports that pollen has exacerbated his symptoms.  Patient  also reports that he was recently cleaning his pool and was around a lot of chlorine other chemicals.  Patient admits that he knows this was not "good for my breathing or COPD".  MMRC - Breathlessness Score 2 - on level ground, I walk slower than people of the same age because of breathlessness, or have to stop for breathe when walking to my own pace  Although patient reports that the symptoms have affected him.  He still is very active outside.  Patient reports that he walks his dogs 1/2 miles a day.  Patient is also very active kayaker.  Patient is interested in discussing a portable oxygen concentrator.     Tests:  01/03/2015-echocardiogram-LV ejection fraction 55 to 35%, grade 1 diastolic dysfunction 3299 - spirometry- FEV1 53 percent, ratio 54 (per previous office notes)  11/23/04-CT maxillofacial-chronic septal thickening of ethmoid septations consistent with chronic sinusitis  10/17/2004-CT sinus limited-bilateral maxillary sinusitis  Chart Review:     Specialty Problems      Pulmonary Problems   Allergic rhinitis    Qualifier: Diagnosis of  By: Joya Gaskins MD, Burnett Harry       COPD GOLD II B     Gold stage C. COPD Spirometry 11/12/2012: FEV1 53% predicted  FEV1/ FVC ratio 54% predicted  PFT >>>       Sinusitis, acute, maxillary   COPD exacerbation (HCC)      No Known Allergies  Immunization History  Administered Date(s) Administered  . Influenza Split 06/02/2012  .  Influenza Whole 05/15/2010, 05/02/2011, 05/18/2013  . Influenza, High Dose Seasonal PF 04/20/2017, 05/05/2018  . Influenza,inj,Quad PF,6+ Mos 05/14/2014, 05/25/2015  . Influenza-Unspecified 05/15/2016  . Pneumococcal Conjugate-13 05/20/2013, 05/15/2016  . Pneumococcal Polysaccharide-23 07/03/2001, 05/02/2011, 05/20/2018  . Td 05/15/2004  . Tdap 02/17/2014  . Zoster 07/16/2011    Pneumovax 23 today   Past Medical History:  Diagnosis Date  . Allergic rhinitis   . Atypical chest pain 12/01/2014  .  Back pain 01/12/2013  . COPD (chronic obstructive pulmonary disease) (HCC)    FeV1 64%-2007  . Emphysema   . GERD (gastroesophageal reflux disease)   . History of lung abscess    bronchiectasis with RMLandRLL ersection -1996- Dr Arlyce Dice  . Hordeolum externum (stye) 04/23/2015   Right eye  . Hypertension   . Impaired vision    glasses  . Osteoarthritis    hands and knees  . Sinusitis, acute 04/23/2015    Tobacco History: Social History   Tobacco Use  Smoking Status Former Smoker  . Packs/day: 1.50  . Years: 38.00  . Pack years: 57.00  . Types: Cigarettes  . Last attempt to quit: 09/03/2000  . Years since quitting: 17.7  Smokeless Tobacco Never Used   Counseling given: Yes  Continue to not smoke  Outpatient Encounter Medications as of 05/20/2018  Medication Sig  . amLODipine (NORVASC) 5 MG tablet Take 1 tablet (5 mg total) by mouth daily.  Marland Kitchen aspirin-acetaminophen-caffeine (EXCEDRIN MIGRAINE) 250-250-65 MG per tablet Take 2 tablets by mouth every 6 (six) hours as needed (arthritis pain).   . calcium elemental as carbonate (TUMS ULTRA 1000) 400 MG chewable tablet Chew 1,000 mg by mouth 2 (two) times daily.  . Capsaicin-Menthol (SALONPAS GEL EX) Apply 1 application topically daily as needed (arthritis pain).  . fluticasone (FLONASE) 50 MCG/ACT nasal spray USE 2 SPRAYS IN EACH NOSTRIL ONE TIME DAILY  . Glucosamine-Chondroitin 750-600 MG TABS Take 1 tablet by mouth 2 (two) times daily.  . Guaifenesin (MUCINEX MAXIMUM STRENGTH) 1200 MG TB12 Take 600 mg by mouth 2 (two) times daily.  . imiquimod (ALDARA) 5 % cream   . loratadine (CLARITIN) 10 MG tablet Take 10 mg by mouth daily.  . meloxicam (MOBIC) 15 MG tablet TAKE 1 TABLET DAILY WITH FOOD AS NEEDED FOR PAIN  . Menthol (ROBITUSSIN COUGH DROPS MT) Use as directed 1 lozenge in the mouth or throat 4 (four) times daily as needed (cough).   . Menthol, Topical Analgesic, (BLUE-EMU MAXIMUM STRENGTH EX) Apply 1 application topically daily as  needed (arthritis pain).  . montelukast (SINGULAIR) 10 MG tablet Take 1 tablet (10 mg total) by mouth daily.  . Multiple Vitamin (MULTIVITAMIN WITH MINERALS) TABS tablet Take 1 tablet by mouth on Monday, Wednesday, Friday with supper   . Neomy-Bacit-Polymyx-Pramoxine (TRIPLE ANTIBIOTIC PAIN RELIEF EX) Apply 1 application topically daily as needed (wound care).  . nitroGLYCERIN (NITRODUR - DOSED IN MG/24 HR) 0.2 mg/hr patch Apply 1/4th patch to affected shoulder, change daily  . TRELEGY ELLIPTA 100-62.5-25 MCG/INH AEPB INHALE 1 PUFF INTO THE LUNGS DAILY.  . [DISCONTINUED] Fluticasone-Umeclidin-Vilant (TRELEGY ELLIPTA) 100-62.5-25 MCG/INH AEPB Inhale 1 puff into the lungs daily.  Marland Kitchen albuterol (PROVENTIL HFA;VENTOLIN HFA) 108 (90 Base) MCG/ACT inhaler Inhale 2 puffs into the lungs every 6 (six) hours as needed for wheezing or shortness of breath. (Patient not taking: Reported on 05/20/2018)  . amoxicillin-clavulanate (AUGMENTIN) 875-125 MG tablet Take 1 tablet by mouth 2 (two) times daily. (Patient not taking: Reported on 05/20/2018)  . Fluticasone-Umeclidin-Vilant (  TRELEGY ELLIPTA) 100-62.5-25 MCG/INH AEPB Inhale 1 puff into the lungs daily.  Marland Kitchen levofloxacin (LEVAQUIN) 500 MG tablet Take 1 tablet (500 mg total) by mouth daily.  . predniSONE (DELTASONE) 10 MG tablet Take 1 tablet (10 mg total) by mouth daily with breakfast.  . [DISCONTINUED] predniSONE (DELTASONE) 20 MG tablet Take two tablets (40mg  total) daily for the next five days. Take with food in the morning. (Patient not taking: Reported on 05/20/2018)   No facility-administered encounter medications on file as of 05/20/2018.      Review of Systems  Review of Systems  Constitutional: Positive for fatigue. Negative for activity change, chills, fever and unexpected weight change.  HENT: Positive for congestion (clear / thick nasal drainage), postnasal drip, sinus pressure and sinus pain (Left maxillary area ). Negative for rhinorrhea, sneezing and  sore throat.   Eyes: Negative.   Respiratory: Positive for cough (yellow brown mucous, increased ). Negative for shortness of breath and wheezing.   Cardiovascular: Negative for chest pain and palpitations.  Gastrointestinal: Negative for constipation, diarrhea, nausea and vomiting.  Endocrine: Negative.   Musculoskeletal: Negative.   Skin: Negative.   Neurological: Negative for dizziness and headaches.  Psychiatric/Behavioral: Negative.  Negative for dysphoric mood. The patient is not nervous/anxious.   All other systems reviewed and are negative.    Physical Exam  BP 128/66 (BP Location: Left Arm, Cuff Size: Normal)   Pulse 77   Temp 97.8 F (36.6 C) (Oral)   Ht 5\' 7"  (1.702 m)   Wt 146 lb 6.4 oz (66.4 kg)   SpO2 97%   BMI 22.93 kg/m   Wt Readings from Last 5 Encounters:  05/20/18 146 lb 6.4 oz (66.4 kg)  05/05/18 149 lb 9.6 oz (67.9 kg)  12/30/17 150 lb (68 kg)  12/23/17 150 lb (68 kg)  12/16/17 150 lb (68 kg)     Physical Exam  Constitutional: He is oriented to person, place, and time and well-developed, well-nourished, and in no distress. No distress.  HENT:  Head: Normocephalic and atraumatic.  Right Ear: Hearing, external ear and ear canal normal.  Left Ear: Hearing, external ear and ear canal normal.  Nose: Mucosal edema and rhinorrhea present. Right sinus exhibits no maxillary sinus tenderness and no frontal sinus tenderness. Left sinus exhibits maxillary sinus tenderness. Left sinus exhibits no frontal sinus tenderness.  Mouth/Throat: Uvula is midline and oropharynx is clear and moist. No oropharyngeal exudate.  TMs with effusion without infection bilaterally, +postnasal drip  Eyes: Pupils are equal, round, and reactive to light.  Neck: Normal range of motion. Neck supple. No JVD present.  Cardiovascular: Normal rate, regular rhythm and normal heart sounds.  Pulmonary/Chest: Effort normal and breath sounds normal. No accessory muscle usage. No respiratory  distress. He has no decreased breath sounds. He has no wheezes. He has no rhonchi.  Musculoskeletal: Normal range of motion. He exhibits no edema.  Lymphadenopathy:    He has no cervical adenopathy.  Neurological: He is alert and oriented to person, place, and time. Gait normal.  Skin: Skin is warm and dry. He is not diaphoretic. No erythema.  Psychiatric: Mood, memory, affect and judgment normal.  Nursing note and vitals reviewed.     Lab Results:  CBC    Component Value Date/Time   WBC 7.5 09/15/2017 1148   RBC 4.52 09/15/2017 1148   HGB 14.0 09/15/2017 1148   HCT 41.8 09/15/2017 1148   PLT 340.0 09/15/2017 1148   MCV 92.4 09/15/2017 1148  MCH 31.0 09/30/2016 0535   MCHC 33.4 09/15/2017 1148   RDW 13.2 09/15/2017 1148   LYMPHSABS 1.0 10/11/2016 1209   MONOABS 0.3 10/11/2016 1209   EOSABS 0.0 10/11/2016 1209   BASOSABS 0.0 10/11/2016 1209    BMET    Component Value Date/Time   NA 138 09/15/2017 1148   K 4.6 09/15/2017 1148   CL 103 09/15/2017 1148   CO2 29 09/15/2017 1148   GLUCOSE 79 09/15/2017 1148   BUN 24 (H) 09/15/2017 1148   CREATININE 0.75 09/15/2017 1148   CREATININE 0.75 07/22/2014 1031   CALCIUM 9.4 09/15/2017 1148   GFRNONAA >60 09/30/2016 0535   GFRNONAA >89 07/22/2014 1031   GFRAA >60 09/30/2016 0535   GFRAA >89 07/22/2014 1031    BNP No results found for: BNP  ProBNP    Component Value Date/Time   PROBNP 18.0 07/20/2013 1403    Imaging: Dg Chest 2 View  Result Date: 05/05/2018 CLINICAL DATA:  Status post previous right middle and upper lobectomy. History of emphysema, CABG, former smoker. No current complaints. EXAM: CHEST - 2 VIEW COMPARISON:  PA and lateral chest x-ray of October 07, 2016 FINDINGS: There is stable volume loss on the right. No acute abnormalities in are noted in the right or left lung. The left lung is well-expanded. The heart and pulmonary vascularity are normal. There are surgical clips which appear stable in the right  suprahilar region. The observed bony thorax exhibits no acute abnormality. IMPRESSION: Stable postsurgical changes in the right mid and upper lung. COPD. No evidence of recurrent abscess or other acute cardiopulmonary abnormality. Electronically Signed   By: David  Martinique M.D.   On: 05/05/2018 10:33      Assessment & Plan:   Pleasant 72 year old patient seen today for acute visit.  Patient with sinusitis today we will treat with Levaquin as patient has completed a course of Augmentin and symptoms have persisted.  Will give prednisone taper as well.  We will have patient continue with trilogy.  Pneumovax 23 given today.  Patient to continue with follow-up in November/2019 with PFT.  Explained to patient that we can walk him at a follow-up visit or he can contact us and schedule 6-minute walk for POC.  Patient's oxygen saturations were 97% on arrival to office today.  Sinusitis, acute, maxillary Prednisone 10mg  tablet  >>>4 tabs for 3 days, then 3 tabs for 3 days, 2 tabs for 3 days, then 1 tab for 3 days, then stop >>>take with food  >>>take in the morning   Levaquin 500mg  tablet  >>> Take 1 tablet (500 mg total) daily for the next 5 days >>>Take with food  >>>Start yogurt or probiotic as this will be her second round of antibiotics in the month   You can always contact your primary care or your local pharmacy regarding getting the Shingrix vaccine (the new shingles vaccine)  Contact our office if you are not improving we can consider getting a CT sinuses at that time  Continue Flonase 1 spray each nostril daily Continue Claritin daily Continue Zyrtec at night Continue nasal saline rinses as needed Continue to avoid known triggers such as pollen or chlorine with the pool   COPD GOLD II B  Trelegy Ellipta  >>> 1 puff daily in the morning >>>rinse mouth out after use  >>> This inhaler contains 3 medications that help manage her respiratory status, contact our office if you cannot  afford this medication or unable to remain on this  medication  Continue Flonase 1 spray each nostril daily Continue Claritin daily Continue Zyrtec at night Continue nasal saline rinses as needed  Continue to avoid known triggers such as pollen or chlorine with the pool  Keep follow-up with our office in November/2019  Continue with getting her pulmonary function test completed in November/2019  Could consider Daliresp if FEV1 drops below 50 on upcoming pulmonary function test   Allergic rhinitis Continue Flonase 1 spray each nostril daily Continue Claritin daily Continue Zyrtec at night Continue nasal saline rinses as needed  Continue to avoid known triggers such as pollen or chlorine with the pool   H/O tobacco use, presenting hazards to health Unable to complete lung cancer screening as patient is outside of the 15-year window. Patient reports he quit smoking in 2002 Patient to continue not smoking  Preventative health care Pneumovax 23 today  You can always contact your primary care or your local pharmacy regarding getting the Shingrix vaccine (the new shingles vaccine)  Keep follow-up with our office in [place], NP 05/20/2018

## 2018-05-20 ENCOUNTER — Telehealth: Payer: Self-pay | Admitting: Pulmonary Disease

## 2018-05-20 ENCOUNTER — Encounter: Payer: Self-pay | Admitting: Pulmonary Disease

## 2018-05-20 ENCOUNTER — Ambulatory Visit: Payer: Medicare HMO | Admitting: Pulmonary Disease

## 2018-05-20 VITALS — BP 128/66 | HR 77 | Temp 97.8°F | Ht 67.0 in | Wt 146.4 lb

## 2018-05-20 DIAGNOSIS — J301 Allergic rhinitis due to pollen: Secondary | ICD-10-CM

## 2018-05-20 DIAGNOSIS — Z0000 Encounter for general adult medical examination without abnormal findings: Secondary | ICD-10-CM

## 2018-05-20 DIAGNOSIS — J418 Mixed simple and mucopurulent chronic bronchitis: Secondary | ICD-10-CM

## 2018-05-20 DIAGNOSIS — Z23 Encounter for immunization: Secondary | ICD-10-CM

## 2018-05-20 DIAGNOSIS — J0101 Acute recurrent maxillary sinusitis: Secondary | ICD-10-CM

## 2018-05-20 DIAGNOSIS — Z87891 Personal history of nicotine dependence: Secondary | ICD-10-CM

## 2018-05-20 MED ORDER — PREDNISONE 10 MG PO TABS: ORAL_TABLET | ORAL | 0 refills | Status: DC

## 2018-05-20 MED ORDER — LEVOFLOXACIN 500 MG PO TABS: 500.0000 mg | ORAL_TABLET | Freq: Every day | ORAL | 0 refills | Status: DC

## 2018-05-20 MED ORDER — PREDNISONE 10 MG PO TABS: 10.0000 mg | ORAL_TABLET | Freq: Every day | ORAL | 0 refills | Status: DC

## 2018-05-20 MED ORDER — FLUTICASONE-UMECLIDIN-VILANT 100-62.5-25 MCG/INH IN AEPB: 1.0000 | INHALATION_SPRAY | Freq: Every day | RESPIRATORY_TRACT | 0 refills | Status: DC

## 2018-05-20 NOTE — Assessment & Plan Note (Signed)
Continue Flonase 1 spray each nostril daily Continue Claritin daily Continue Zyrtec at night Continue nasal saline rinses as needed  Continue to avoid known triggers such as pollen or chlorine with the pool

## 2018-05-20 NOTE — Patient Instructions (Addendum)
Prednisone 10mg  tablet  >>>4 tabs for 3 days, then 3 tabs for 3 days, 2 tabs for 3 days, then 1 tab for 3 days, then stop >>>take with food  >>>take in the morning   Levaquin 500mg  tablet  >>> Take 1 tablet (500 mg total) daily for the next 5 days >>>Take with food  >>>Start yogurt or probiotic as this will be her second round of antibiotics in the month   Pneumovax 23 today  You can always contact your primary care or your local pharmacy regarding getting the Shingrix vaccine (the new shingles vaccine)  Contact our office if you are not improving we can consider getting a CT sinuses at that time   Continue Flonase 1 spray each nostril daily Continue Claritin daily Continue Zyrtec at night Continue nasal saline rinses as needed  Continue to avoid known triggers such as pollen or chlorine with the pool    Keep follow-up with our office in November/2019  Continue with getting her pulmonary function test completed in November/2019  Unfortunately we cannot walk you for a POC today, we can do this at your next office visit when you are at a stable interval or you can contact us sooner and we can schedule a 6-minute walk  It is flu season:   >>>Remember to be washing your hands regularly, using hand sanitizer, be careful to use around herself with has contact with people who are sick will increase her chances of getting sick yourself. >>> Best ways to protect herself from the flu: Receive the yearly flu vaccine, practice good hand hygiene washing with soap and also using hand sanitizer when available, eat a nutritious meals, get adequate rest, hydrate appropriately   Please contact the office if your symptoms worsen or you have concerns that you are not improving.   Thank you for choosing [facility] for your healthcare, and for allowing [place] to partner with you on your healthcare journey. I am thankful to be able to provide care to you today.   Wyn Quaker  FNP-C         Sinusitis, Adult Sinusitis is soreness and inflammation of your sinuses. Sinuses are hollow spaces in the bones around your face. They are located:  Around your eyes.  In the middle of your forehead.  Behind your nose.  In your cheekbones.  Your sinuses and nasal passages are lined with a stringy fluid (mucus). Mucus normally drains out of your sinuses. When your nasal tissues get inflamed or swollen, the mucus can get trapped or blocked so air cannot flow through your sinuses. This lets bacteria, viruses, and funguses grow, and that leads to infection. Follow these instructions at home: Medicines  Take, use, or apply over-the-counter and prescription medicines only as told by your doctor. These may include nasal sprays.  If you were prescribed an antibiotic medicine, take it as told by your doctor. Do not stop taking the antibiotic even if you start to feel better. Hydrate and Humidify  Drink enough water to keep your pee (urine) clear or pale yellow.  Use a cool mist humidifier to keep the humidity level in your home above 50%.  Breathe in steam for 10-15 minutes, 3-4 times a day or as told by your doctor. You can do this in the bathroom while a hot shower is running.  Try not to spend time in cool or dry air. Rest  Rest as much as possible.  Sleep with your head raised (elevated).  Make  sure to get enough sleep each night. General instructions  Put a warm, moist washcloth on your face 3-4 times a day or as told by your doctor. This will help with discomfort.  Wash your hands often with soap and water. If there is no soap and water, use hand sanitizer.  Do not smoke. Avoid being around people who are smoking (secondhand smoke).  Keep all follow-up visits as told by your doctor. This is important. Contact a doctor if:  You have a fever.  Your symptoms get worse.  Your symptoms do not get better within 10 days. Get help right away if:  You  have a very bad headache.  You cannot stop throwing up (vomiting).  You have pain or swelling around your face or eyes.  You have trouble seeing.  You feel confused.  Your neck is stiff.  You have trouble breathing. This information is not intended to replace advice given to you by your health care provider. Make sure you discuss any questions you have with your health care provider. Document Released: 02/05/2008 Document Revised: 04/14/2016 Document Reviewed: 06/14/2015 Elsevier Interactive Patient Education  2018 [employer].   Pneumococcal Vaccine, Polyvalent solution for injection What is this medicine? PNEUMOCOCCAL VACCINE, POLYVALENT (NEU mo KOK al vak SEEN, pol ee VEY luhnt) is a vaccine to prevent pneumococcus bacteria infection. These bacteria are a major cause of ear infections, Strep throat infections, and serious pneumonia, meningitis, or blood infections worldwide. These vaccines help the body to produce antibodies (protective substances) that help your body defend against these bacteria. This vaccine is recommended for people 42 years of age and older with health problems. It is also recommended for all adults over 12 years old. This vaccine will not treat an infection. This medicine may be used for other purposes; ask your health care provider or pharmacist if you have questions. COMMON BRAND NAME(S): Pneumovax 23 What should I tell my health care provider before I take this medicine? They need to know if you have any of these conditions: -bleeding problems -bone marrow or organ transplant -cancer, Hodgkin's disease -fever -infection -immune system problems -low platelet count in the blood -seizures -an unusual or allergic reaction to pneumococcal vaccine, diphtheria toxoid, other vaccines, latex, other medicines, foods, dyes, or preservatives -pregnant or trying to get pregnant -breast-feeding How should I use this medicine? This vaccine is for injection into a  muscle or under the skin. It is given by a health care professional. A copy of Vaccine Information Statements will be given before each vaccination. Read this sheet carefully each time. The sheet may change frequently. Talk to your pediatrician regarding the use of this medicine in children. While this drug may be prescribed for children as young as 59 years of age for selected conditions, precautions do apply. Overdosage: If you think you have taken too much of this medicine contact a poison control center or emergency room at once. NOTE: This medicine is only for you. Do not share this medicine with others. What if I miss a dose? It is important not to miss your dose. Call your doctor or health care professional if you are unable to keep an appointment. What may interact with this medicine? -medicines for cancer chemotherapy -medicines that suppress your immune function -medicines that treat or prevent blood clots like warfarin, enoxaparin, and dalteparin -steroid medicines like prednisone or cortisone This list may not describe all possible interactions. Give your health care provider a list of all the medicines, herbs,  non-prescription drugs, or dietary supplements you use. Also tell them if you smoke, drink alcohol, or use illegal drugs. Some items may interact with your medicine. What should I watch for while using this medicine? Mild fever and pain should go away in 3 days or less. Report any unusual symptoms to your doctor or health care professional. What side effects may I notice from receiving this medicine? Side effects that you should report to your doctor or health care professional as soon as possible: -allergic reactions like skin rash, itching or hives, swelling of the face, lips, or tongue -breathing problems -confused -fever over 102 degrees F -pain, tingling, numbness in the hands or feet -seizures -unusual bleeding or bruising -unusual muscle weakness Side effects that  usually do not require medical attention (report to your doctor or health care professional if they continue or are bothersome): -aches and pains -diarrhea -fever of 102 degrees F or less -headache -irritable -loss of appetite -pain, tender at site where injected -trouble sleeping This list may not describe all possible side effects. Call your doctor for medical advice about side effects. You may report side effects to FDA at 1-800-FDA-1088. Where should I keep my medicine? This does not apply. This vaccine is given in a clinic, pharmacy, doctor's office, or other health care setting and will not be stored at home. NOTE: This sheet is a summary. It may not cover all possible information. If you have questions about this medicine, talk to your doctor, pharmacist, or health care provider.  2018 Elsevier/Gold Standard (2008-03-25 14:32:37)

## 2018-05-20 NOTE — Assessment & Plan Note (Signed)
Unable to complete lung cancer screening as patient is outside of the 15-year window. Patient reports he quit smoking in 2002 Patient to continue not smoking

## 2018-05-20 NOTE — Telephone Encounter (Signed)
Spoke with pharmacist, clarification given and new prescription sent in. Nothing further needed.

## 2018-05-20 NOTE — Assessment & Plan Note (Signed)
Pneumovax 23 today  You can always contact your primary care or your local pharmacy regarding getting the Shingrix vaccine (the new shingles vaccine)  Keep follow-up with our office in November/2019

## 2018-05-20 NOTE — Assessment & Plan Note (Signed)
Prednisone 10mg  tablet  >>>4 tabs for 3 days, then 3 tabs for 3 days, 2 tabs for 3 days, then 1 tab for 3 days, then stop >>>take with food  >>>take in the morning   Levaquin 500mg  tablet  >>> Take 1 tablet (500 mg total) daily for the next 5 days >>>Take with food  >>>Start yogurt or probiotic as this will be her second round of antibiotics in the month   You can always contact your primary care or your local pharmacy regarding getting the Shingrix vaccine (the new shingles vaccine)  Contact our office if you are not improving we can consider getting a CT sinuses at that time  Continue Flonase 1 spray each nostril daily Continue Claritin daily Continue Zyrtec at night Continue nasal saline rinses as needed Continue to avoid known triggers such as pollen or chlorine with the pool

## 2018-05-20 NOTE — Assessment & Plan Note (Signed)
Trelegy Ellipta  >>> 1 puff daily in the morning >>>rinse mouth out after use  >>> This inhaler contains 3 medications that help manage her respiratory status, contact our office if you cannot afford this medication or unable to remain on this medication  Continue Flonase 1 spray each nostril daily Continue Claritin daily Continue Zyrtec at night Continue nasal saline rinses as needed  Continue to avoid known triggers such as pollen or chlorine with the pool  Keep follow-up with our office in November/2019  Continue with getting her pulmonary function test completed in November/2019  Could consider Daliresp if FEV1 drops below 50 on upcoming pulmonary function test

## 2018-05-20 NOTE — Telephone Encounter (Signed)
See other phone note on patient. Will close encounter. Patient is aware. Nothing further needed.

## 2018-06-08 NOTE — Progress Notes (Signed)
@Patient  ID: [name], male    DOB: 1946-01-01, 72 y.o.   MRN: [id number]  Chief Complaint  Patient presents with  . Acute Visit    Referring provider: Mosie Lukes, MD  HPI:  72 year old male former smoker followed in our office for COPD GOLD II C (2014 - spirometry, FEV1 53 percent, ratio 54)  PMH: GERD, AR, COPD, Lung Abscess in 1996 s/p RML resection Smoker/ Smoking History: Former Smoker. Quit in 2002. 57 pack years.  Maintenance:  Trelegy Ellipta  Pt of: Dr. Elsworth Soho   Recent [facility] Encounters:   05/20/2018  - Visit - BM 72 year old presenting for acute visit.  Patient is having ongoing sinus pain and tenderness as well as increased congestion.  Patient producing yellow to brown mucus when coughing.  Patient reports is been outside for extended periods of time over the past couple weeks and reports the pollen is exacerbate his symptoms.  Patient also reports he is recently was cleaning his pool and around a lot of chlorine he knows that this is "not good for his COPD".  mMRC 2.  Although patient reports his symptoms have affected him he still is very active outside still walks his dog about 0.5 miles a day.  Patient is also inactive kayaker. Plan: Levaquin, prednisone taper, continue trelegy, complete PFT with Nov/2019  06/09/2018  - Visit   72 year old male patient presenting today for acute visit of left maxillofacial sinus tenderness, productive cough with brown mucus.  Patient denies fever or chills.  Patient also having increased postnasal drip.  Patient reports he is been outside more and this is a known trigger for him.  Patient is still been able to kayak regularly as well as walk his dog 0.5 miles twice daily.  Patient has recent antibiotic use of Augmentin as well as Levaquin.  Patient reports that his symptoms did improve significantly on Levaquin but then have returned.  Patient is a former smoker.  Patient is adherent to Trelegy Ellipta.  Patient is  adherent to Claritin, Zyrtec, Singulair, Flonase daily.  Patient persists to have increased postnasal drip.  Of note patient does have chronic sinusitis history back in 2006 on CT patient also had sinus surgery done at that time.    Chart Review:   Tests:   01/03/2015-echocardiogram-LV ejection fraction 55 to 28%, grade 1 diastolic dysfunction 3151 - spirometry- FEV1 53 percent, ratio 54 (per previous office notes)  11/23/04-CT maxillofacial-chronic septal thickening of ethmoid septations consistent with chronic sinusitis  10/17/2004-CT sinus limited-bilateral maxillary sinusitis    Specialty Problems      Pulmonary Problems   Allergic rhinitis    Qualifier: Diagnosis of  By: Joya Gaskins MD, Burnett Harry   Seems to flare from summer to fall and winter to spring      COPD GOLD II B     Gold stage C. COPD Spirometry 11/12/2012: FEV1 53% predicted  FEV1/ FVC ratio 54% predicted  PFT >>>       Sinusitis, acute, maxillary   COPD exacerbation (HCC)      No Known Allergies  Immunization History  Administered Date(s) Administered  . Influenza Split 06/02/2012  . Influenza Whole 05/15/2010, 05/02/2011, 05/18/2013  . Influenza, High Dose Seasonal PF 04/20/2017, 05/05/2018  . Influenza,inj,Quad PF,6+ Mos 05/14/2014, 05/25/2015  . Influenza-Unspecified 05/15/2016  . Pneumococcal Conjugate-13 05/20/2013, 05/15/2016  . Pneumococcal Polysaccharide-23 07/03/2001, 05/02/2011, 05/20/2018  . Td 05/15/2004  . Tdap 02/17/2014  . Zoster 07/16/2011    Past  Medical History:  Diagnosis Date  . Allergic rhinitis   . Atypical chest pain 12/01/2014  . Back pain 01/12/2013  . COPD (chronic obstructive pulmonary disease) (HCC)    FeV1 64%-2007  . Emphysema   . GERD (gastroesophageal reflux disease)   . History of lung abscess    bronchiectasis with RMLandRLL ersection -1996- Dr Arlyce Dice  . Hordeolum externum (stye) 04/23/2015   Right eye  . Hypertension   . Impaired vision    glasses  .  Osteoarthritis    hands and knees  . Sinusitis, acute 04/23/2015    Tobacco History: Social History   Tobacco Use  Smoking Status Former Smoker  . Packs/day: 1.50  . Years: 38.00  . Pack years: 57.00  . Types: Cigarettes  . Last attempt to quit: 09/03/2000  . Years since quitting: 17.7  Smokeless Tobacco Never Used   Counseling given: Not Answered   Outpatient Encounter Medications as of 06/09/2018  Medication Sig  . albuterol (PROVENTIL HFA;VENTOLIN HFA) 108 (90 Base) MCG/ACT inhaler Inhale 2 puffs into the lungs every 6 (six) hours as needed for wheezing or shortness of breath.  Marland Kitchen amLODipine (NORVASC) 5 MG tablet Take 1 tablet (5 mg total) by mouth daily.  Marland Kitchen aspirin-acetaminophen-caffeine (EXCEDRIN MIGRAINE) 250-250-65 MG per tablet Take 2 tablets by mouth every 6 (six) hours as needed (arthritis pain).   . calcium elemental as carbonate (TUMS ULTRA 1000) 400 MG chewable tablet Chew 1,000 mg by mouth 2 (two) times daily.  . Capsaicin-Menthol (SALONPAS GEL EX) Apply 1 application topically daily as needed (arthritis pain).  . fluticasone (FLONASE) 50 MCG/ACT nasal spray USE 2 SPRAYS IN EACH NOSTRIL ONE TIME DAILY  . Fluticasone-Umeclidin-Vilant (TRELEGY ELLIPTA) 100-62.5-25 MCG/INH AEPB Inhale 1 puff into the lungs daily.  . Glucosamine-Chondroitin 750-600 MG TABS Take 1 tablet by mouth 2 (two) times daily.  . Guaifenesin (MUCINEX MAXIMUM STRENGTH) 1200 MG TB12 Take 600 mg by mouth 2 (two) times daily.  . imiquimod (ALDARA) 5 % cream   . loratadine (CLARITIN) 10 MG tablet Take 10 mg by mouth daily.  . meloxicam (MOBIC) 15 MG tablet TAKE 1 TABLET DAILY WITH FOOD AS NEEDED FOR PAIN  . Menthol (ROBITUSSIN COUGH DROPS MT) Use as directed 1 lozenge in the mouth or throat 4 (four) times daily as needed (cough).   . Menthol, Topical Analgesic, (BLUE-EMU MAXIMUM STRENGTH EX) Apply 1 application topically daily as needed (arthritis pain).  . montelukast (SINGULAIR) 10 MG tablet Take 1 tablet  (10 mg total) by mouth daily.  . Multiple Vitamin (MULTIVITAMIN WITH MINERALS) TABS tablet Take 1 tablet by mouth on Monday, Wednesday, Friday with supper   . Neomy-Bacit-Polymyx-Pramoxine (TRIPLE ANTIBIOTIC PAIN RELIEF EX) Apply 1 application topically daily as needed (wound care).  . nitroGLYCERIN (NITRODUR - DOSED IN MG/24 HR) 0.2 mg/hr patch Apply 1/4th patch to affected shoulder, change daily  . TRELEGY ELLIPTA 100-62.5-25 MCG/INH AEPB INHALE 1 PUFF INTO THE LUNGS DAILY.  Marland Kitchen amoxicillin-clavulanate (AUGMENTIN) 875-125 MG tablet Take 1 tablet by mouth 2 (two) times daily.  Marland Kitchen levofloxacin (LEVAQUIN) 500 MG tablet Take 1 tablet (500 mg total) by mouth daily. (Patient not taking: Reported on 06/09/2018)  . predniSONE (DELTASONE) 10 MG tablet Take 1 tablet (10 mg total) by mouth daily with breakfast. (Patient not taking: Reported on 06/09/2018)  . predniSONE (DELTASONE) 10 MG tablet 4 tabs for 3 days, then 3 tabs for 3 days, 2 tabs for 3 days, then 1 tab for 3 days, then stop (Patient  not taking: Reported on 06/09/2018)  . [DISCONTINUED] amoxicillin-clavulanate (AUGMENTIN) 875-125 MG tablet Take 1 tablet by mouth 2 (two) times daily. (Patient not taking: Reported on 06/09/2018)   No facility-administered encounter medications on file as of 06/09/2018.     Review of Systems  Review of Systems  Constitutional: Positive for fatigue. Negative for activity change, chills, fever and unexpected weight change.  HENT: Positive for congestion (clear nasal drainage ), postnasal drip and sinus pressure (clear/ yellow sinus drainage ). Negative for rhinorrhea, sinus pain, sneezing and sore throat.   Eyes: Negative.   Respiratory: Positive for cough (cough with brown productive mucous) and chest tightness. Negative for shortness of breath and wheezing.   Cardiovascular: Negative for chest pain and palpitations.  Gastrointestinal: Negative for constipation, diarrhea, nausea and vomiting.  Endocrine: Negative.     Musculoskeletal: Negative.   Skin: Negative.   Allergic/Immunologic: Positive for environmental allergies.  Neurological: Negative for dizziness and headaches.  Psychiatric/Behavioral: Negative.  Negative for dysphoric mood. The patient is not nervous/anxious.   All other systems reviewed and are negative.    Physical Exam  BP 130/70 (BP Location: Left Arm, Cuff Size: Normal)   Pulse 81   Temp (!) 97.5 F (36.4 C) (Oral)   Ht 5\' 7"  (1.702 m)   Wt 150 lb 9.6 oz (68.3 kg)   SpO2 96%   BMI 23.59 kg/m   Wt Readings from Last 5 Encounters:  06/09/18 150 lb 9.6 oz (68.3 kg)  05/20/18 146 lb 6.4 oz (66.4 kg)  05/05/18 149 lb 9.6 oz (67.9 kg)  12/30/17 150 lb (68 kg)  12/23/17 150 lb (68 kg)     Physical Exam  Constitutional: He is oriented to person, place, and time and well-developed, well-nourished, and in no distress. Vital signs are normal. No distress.  HENT:  Head: Normocephalic and atraumatic.  Right Ear: Hearing, tympanic membrane, external ear and ear canal normal.  Left Ear: Hearing, tympanic membrane, external ear and ear canal normal.  Nose: Mucosal edema present. Right sinus exhibits no maxillary sinus tenderness and no frontal sinus tenderness. Left sinus exhibits maxillary sinus tenderness. Left sinus exhibits no frontal sinus tenderness.  Mouth/Throat: Uvula is midline and oropharynx is clear and moist. No oropharyngeal exudate.  +post nasal drip   Eyes: Pupils are equal, round, and reactive to light.  Neck: Normal range of motion. Neck supple. No JVD present.  Cardiovascular: Normal rate, regular rhythm and normal heart sounds.  Pulmonary/Chest: Effort normal and breath sounds normal. No accessory muscle usage. No respiratory distress. He has no decreased breath sounds. He has no wheezes. He has no rhonchi.  Abdominal: Soft. Bowel sounds are normal. There is no tenderness.  Musculoskeletal: Normal range of motion. He exhibits no edema.  Lymphadenopathy:    He  has no cervical adenopathy.  Neurological: He is alert and oriented to person, place, and time. Gait normal.  Skin: Skin is warm and dry. He is not diaphoretic. No erythema.  Psychiatric: Mood, memory, affect and judgment normal.  Nursing note and vitals reviewed.     Lab Results:  CBC    Component Value Date/Time   WBC 7.5 09/15/2017 1148   RBC 4.52 09/15/2017 1148   HGB 14.0 09/15/2017 1148   HCT 41.8 09/15/2017 1148   PLT 340.0 09/15/2017 1148   MCV 92.4 09/15/2017 1148   MCH 31.0 09/30/2016 0535   MCHC 33.4 09/15/2017 1148   RDW 13.2 09/15/2017 1148   LYMPHSABS 1.0 10/11/2016 1209  MONOABS 0.3 10/11/2016 1209   EOSABS 0.0 10/11/2016 1209   BASOSABS 0.0 10/11/2016 1209    BMET    Component Value Date/Time   NA 138 09/15/2017 1148   K 4.6 09/15/2017 1148   CL 103 09/15/2017 1148   CO2 29 09/15/2017 1148   GLUCOSE 79 09/15/2017 1148   BUN 24 (H) 09/15/2017 1148   CREATININE 0.75 09/15/2017 1148   CREATININE 0.75 07/22/2014 1031   CALCIUM 9.4 09/15/2017 1148   GFRNONAA >60 09/30/2016 0535   GFRNONAA >89 07/22/2014 1031   GFRAA >60 09/30/2016 0535   GFRAA >89 07/22/2014 1031    BNP No results found for: BNP  ProBNP    Component Value Date/Time   PROBNP 18.0 07/20/2013 1403      Assessment & Plan:   Pleasant 72 year old male patient presenting today for acute visit.  Patient with persistent sinusitis.  Will get a sputum culture today.  Lung exam actually sounds quite good with no wheezing and clear to auscultation.  I have suspicion the patient may have bronchiectasis due to his chronic sinusitis as well as persistent cough and difficulty to mobilize secretions.  We will get a CT of his sinuses as well as a high-res CT to evaluate for bronchiectasis.  Encourage patient to keep November follow-up as well as to complete upcoming pulmonary function test.  Will have patient start Augmentin in the meantime which may need to be changed based off of sputum sample  results.  We will also have patient start Mucinex regularly.  Patient to keep aggressive AR regimen that he is currently on.  Patient to continue to work to avoid known triggers such as pollen and dust.  COPD GOLD II B  We will order a CT of your sinuses  We will also order a high-res CT for evaluation of bronchiectasis  I would also recommend that you start Mucinex daily  >>> Samples provided today  Continue Trelegy Ellipta  >>> 1 puff daily in the morning >>>rinse mouth out after use  >>> This inhaler contains 3 medications that help manage her respiratory status, contact our office if you cannot afford this medication or unable to remain on this medication   Keep follow-up in November Complete PFT with November follow-up  Sinusitis, acute, maxillary We will order a CT of your sinuses  We will also order a high-res CT for evaluation of bronchiectasis  I would also recommend that you start Mucinex daily  >>> Samples provided today  Augmentin >>> Take 1 875-125 mg tablet every 12 hours for the next 7 days >>> Take with food  We will test her sputum to see if there is any specific bacterial infections with a sensitivity for antibiotics this may require [place] to change her Augmentin antibiotic if necessary  Keep follow-up in November  Allergic rhinitis Continue daily Zyrtec Continue daily Claritin Continue Flonase Continue Singulair  Can add nasal saline rinses    Lauraine Rinne, NP 06/09/2018

## 2018-06-09 ENCOUNTER — Other Ambulatory Visit: Payer: Medicare HMO

## 2018-06-09 ENCOUNTER — Ambulatory Visit (HOSPITAL_BASED_OUTPATIENT_CLINIC_OR_DEPARTMENT_OTHER)
Admission: RE | Admit: 2018-06-09 | Discharge: 2018-06-09 | Disposition: A | Discharge status: Home / Self Care | Payer: Medicare HMO | Source: Ambulatory Visit | Attending: Pulmonary Disease | Admitting: Pulmonary Disease

## 2018-06-09 ENCOUNTER — Encounter: Payer: Self-pay | Admitting: Pulmonary Disease

## 2018-06-09 ENCOUNTER — Telehealth: Payer: Self-pay | Admitting: Pulmonary Disease

## 2018-06-09 ENCOUNTER — Ambulatory Visit: Payer: Medicare HMO | Admitting: Pulmonary Disease

## 2018-06-09 VITALS — BP 130/70 | HR 81 | Temp 97.5°F | Ht 67.0 in | Wt 150.6 lb

## 2018-06-09 DIAGNOSIS — J0101 Acute recurrent maxillary sinusitis: Secondary | ICD-10-CM

## 2018-06-09 DIAGNOSIS — R059 Cough, unspecified: Secondary | ICD-10-CM

## 2018-06-09 DIAGNOSIS — J301 Allergic rhinitis due to pollen: Secondary | ICD-10-CM | POA: Diagnosis not present

## 2018-06-09 DIAGNOSIS — Z9889 Other specified postprocedural states: Secondary | ICD-10-CM | POA: Insufficient documentation

## 2018-06-09 DIAGNOSIS — R918 Other nonspecific abnormal finding of lung field: Secondary | ICD-10-CM | POA: Insufficient documentation

## 2018-06-09 DIAGNOSIS — J441 Chronic obstructive pulmonary disease with (acute) exacerbation: Secondary | ICD-10-CM | POA: Diagnosis not present

## 2018-06-09 DIAGNOSIS — R05 Cough: Secondary | ICD-10-CM | POA: Insufficient documentation

## 2018-06-09 DIAGNOSIS — I2510 Atherosclerotic heart disease of native coronary artery without angina pectoris: Secondary | ICD-10-CM | POA: Diagnosis not present

## 2018-06-09 DIAGNOSIS — J329 Chronic sinusitis, unspecified: Secondary | ICD-10-CM | POA: Diagnosis not present

## 2018-06-09 DIAGNOSIS — I700 Atherosclerosis of aorta: Secondary | ICD-10-CM | POA: Diagnosis not present

## 2018-06-09 DIAGNOSIS — J418 Mixed simple and mucopurulent chronic bronchitis: Secondary | ICD-10-CM

## 2018-06-09 MED ORDER — AMOXICILLIN-POT CLAVULANATE 875-125 MG PO TABS: 1.0000 | ORAL_TABLET | Freq: Two times a day (BID) | ORAL | 0 refills | Status: DC

## 2018-06-09 NOTE — Assessment & Plan Note (Signed)
Continue daily Zyrtec Continue daily Claritin Continue Flonase Continue Singulair  Can add nasal saline rinses

## 2018-06-09 NOTE — Patient Instructions (Signed)
We will order a CT of your sinuses  We will also order a high-res CT for evaluation of bronchiectasis  I would also recommend that you start Mucinex daily  >>> Samples provided today  Augmentin >>> Take 1 875-125 mg tablet every 12 hours for the next 7 days >>> Take with food  We will test her sputum to see if there is any specific bacterial infections with a sensitivity for antibiotics this may require [place] to change her Augmentin antibiotic if necessary  Keep follow-up in November Complete PFT with November follow-up  It is flu season:   >>>Remember to be washing your hands regularly, using hand sanitizer, be careful to use around herself with has contact with people who are sick will increase her chances of getting sick yourself. >>> Best ways to protect herself from the flu: Receive the yearly flu vaccine, practice good hand hygiene washing with soap and also using hand sanitizer when available, eat a nutritious meals, get adequate rest, hydrate appropriately   Please contact the office if your symptoms worsen or you have concerns that you are not improving.   Thank you for choosing [facility] for your healthcare, and for allowing [place] to partner with you on your healthcare journey. I am thankful to be able to provide care to you today.   Wyn Quaker FNP-C

## 2018-06-09 NOTE — Telephone Encounter (Signed)
Called and spoke with pt letting him know that I was still seeing him scheduled for the CT chest as well as the CT sinuses today.  Pt expressed understanding. Nothing further needed.

## 2018-06-09 NOTE — Assessment & Plan Note (Signed)
We will order a CT of your sinuses  We will also order a high-res CT for evaluation of bronchiectasis  I would also recommend that you start Mucinex daily  >>> Samples provided today  Augmentin >>> Take 1 875-125 mg tablet every 12 hours for the next 7 days >>> Take with food  We will test her sputum to see if there is any specific bacterial infections with a sensitivity for antibiotics this may require [place] to change her Augmentin antibiotic if necessary  Keep follow-up in November

## 2018-06-09 NOTE — Assessment & Plan Note (Signed)
We will order a CT of your sinuses  We will also order a high-res CT for evaluation of bronchiectasis  I would also recommend that you start Mucinex daily  >>> Samples provided today  Continue Trelegy Ellipta  >>> 1 puff daily in the morning >>>rinse mouth out after use  >>> This inhaler contains 3 medications that help manage her respiratory status, contact our office if you cannot afford this medication or unable to remain on this medication   Keep follow-up in November Complete PFT with November follow-up

## 2018-06-10 NOTE — Progress Notes (Signed)
Sinus CT results showing no acute sinusitis or chronic sinusitis.  High-res chest CT not showing any bronchiectasis this is what we talked about that I was concerned she may have.  There are a few scattered pulmonary nodules that are quite small.  Largest being 7 mm in left lower lobe.  We will follow this with the CT in 3 months.  We can discuss further at our next office visit.  No further changes to plan of care at this time.  Wyn Quaker, FNP

## 2018-06-29 NOTE — Progress Notes (Signed)
Normal growth of oropharyngeal flora.  No further changes at this time.  Keep follow-up with our office.  Wyn Quaker FNP

## 2018-07-05 NOTE — Progress Notes (Signed)
@Patient  ID: [name], male    DOB: Jun 19, 1946, 72 y.o.   MRN: [id number]  Chief Complaint  Patient presents with  . Follow-up    pft results     Referring provider: Mosie Lukes, MD  HPI:  72 year old male former smoker followed in our office for COPD GOLD II C (2014 - spirometry, FEV1 53 percent, ratio 54)  PMH: GERD, AR, COPD, Lung Abscess in 1996 s/p RML resection Smoker/ Smoking History: Former Smoker. Quit in 2002. 57 pack years.  Maintenance:  Trelegy Ellipta  Pt of: Dr. Elsworth Soho   Recent [facility] Encounters:   05/20/2018  - Visit - BM 72 year old presenting for acute visit.  Patient is having ongoing sinus pain and tenderness as well as increased congestion.  Patient producing yellow to brown mucus when coughing.  Patient reports is been outside for extended periods of time over the past couple weeks and reports the pollen is exacerbate his symptoms.  Patient also reports he is recently was cleaning his pool and around a lot of chlorine he knows that this is "not good for his COPD".  mMRC 2.  Although patient reports his symptoms have affected him he still is very active outside still walks his dog about 0.5 miles a day.  Patient is also inactive kayaker. Plan: Levaquin, prednisone taper, continue trelegy, complete PFT with Nov/2019  06/09/2018  - Visit   72 year old male patient presenting today for acute visit of left maxillofacial sinus tenderness, productive cough with brown mucus.  Patient denies fever or chills.  Patient also having increased postnasal drip.  Patient reports he is been outside more and this is a known trigger for him.  Patient is still been able to kayak regularly as well as walk his dog 0.5 miles twice daily.  Patient has recent antibiotic use of Augmentin as well as Levaquin.  Patient reports that his symptoms did improve significantly on Levaquin but then have returned.  Patient is a former smoker.  Patient is adherent to Trelegy Ellipta.   Patient is adherent to Claritin, Zyrtec, Singulair, Flonase daily.  Patient persists to have increased postnasal drip.  Of note patient does have chronic sinusitis history back in 2006 on CT patient also had sinus surgery done at that time.      07/06/2018  - Visit   72 year old male patient presenting today for follow-up visit after completing pulmonary function test.  07/06/2018-pulmonary function test-FVC 2.74 (71% predicted), postbronchodilator ratio 64, FEV1 61, DLCO 53, patient used trelegy before testing.  Patient reports he still active.  Patient continues to kayak and fish often throughout the week.  Patient does report that he has a baseline of fatigue which is different than how he felt 10 years ago.  Patient does report that his cough is improved, sputum color has decreased also has changed to more of a clear white.  MMRC - Breathlessness Score 2 - on level ground, I walk slower than people of the same age because of breathlessness, or have to stop for breathe when walking to my own pace   Tests:  06/09/2018-CT maxillofacial- sphenoid, maxillary, ethmoid and frontal sinuses are essentially clear, postsurgical change can be seen in the nasal cavity 06/09/2018-CT chest high-res- no findings of interstitial lung disease, mild air trapping, small pulmonary nodules in the left lung largest wishes 7 mm >>>noncontrast chest CT in 3 to 6 months is recommended  07/06/2018-pulmonary function test-FVC 2.74 (71% predicted), postbronchodilator ratio 64, FEV1 61, DLCO  53, patient used trelegy before testing.  01/03/2015-echocardiogram-LV ejection fraction 55 to 26%, grade 1 diastolic dysfunction 3785 - spirometry- FEV1 53 percent, ratio 54 (per previous office notes)  11/23/04-CT maxillofacial-chronic septal thickening of ethmoid septations consistent with chronic sinusitis  10/17/2004-CT sinus limited-bilateral maxillary sinusitis   FENO:  No results found for: NITRICOXIDE  PFT: PFT  Results Latest Ref Rng & Units 07/06/2018  FVC-Pre L 2.74  FVC-Predicted Pre % 71  FVC-Post L 2.79  FVC-Predicted Post % 72  Pre FEV1/FVC % % 63  Post FEV1/FCV % % 64  FEV1-Pre L 1.72  FEV1-Predicted Pre % 61  DLCO UNC% % 53  DLCO COR %Predicted % 77  TLC L 4.86  TLC % Predicted % 76  RV % Predicted % 85    Imaging: Ct Chest High Resolution  Result Date: 06/09/2018 CLINICAL DATA:  72 year old male with history of cough and congestion. Evaluate for interstitial lung disease. EXAM: CT CHEST WITHOUT CONTRAST TECHNIQUE: Multidetector CT imaging of the chest was performed following the standard protocol without intravenous contrast. High resolution imaging of the lungs, as well as inspiratory and expiratory imaging, was performed. COMPARISON:  No priors. FINDINGS: Cardiovascular: Heart size is normal. There is no significant pericardial fluid, thickening or pericardial calcification. There is aortic atherosclerosis, as well as atherosclerosis of the great vessels of the mediastinum and the coronary arteries, including calcified atherosclerotic plaque in the left main and left anterior descending coronary arteries. Mediastinum/Nodes: No pathologically enlarged mediastinal or hilar lymph nodes. Please note that accurate exclusion of hilar adenopathy is limited on noncontrast CT scans. Esophagus is unremarkable in appearance. No axillary lymphadenopathy. Lungs/Pleura: Postoperative changes in the right hemithorax, likely from prior right lower and middle lobectomy. Compensatory hyperexpansion of the remaining right upper lobe. Mild pleural scarring in the periphery of the upper right hemithorax. Additional scarring in the base of the right lung. High-resolution images demonstrate no significant regions of ground-glass attenuation, subpleural reticulation, parenchymal banding, traction bronchiectasis or frank honeycombing. Inspiratory and expiratory imaging demonstrates some mild air trapping, indicative of  small airways disease. 10 x 4 mm (mean diameter of 7 mm) nodule in the medial aspect of the left lower lobe (axial image 126 of series 5). 4 mm left lower lobe nodule (axial image 136 of series 5). No acute consolidative airspace disease. No pleural effusions. Upper Abdomen: Aortic atherosclerosis. Musculoskeletal: Orthopedic fixation hardware of ACDF in the lower cervical spine, incompletely imaged. 6 mm of anterolisthesis of C7 on T1. Orthopedic fixation hardware also noted in the lumbar spine, incompletely imaged. There are no aggressive appearing lytic or blastic lesions noted in the visualized portions of the skeleton. IMPRESSION: 1. No findings to suggest interstitial lung disease. 2. Mild air trapping indicative of mild small airways disease. 3. Small pulmonary nodules in the left lung, largest of which has a mean diameter of 7 mm in the medial aspect of the left lower lobe. Non-contrast chest CT at 3-6 months is recommended. If the nodules are stable at time of repeat CT, then future CT at 18-24 months (from today's scan) is considered optional for low-risk patients, but is recommended for high-risk patients. This recommendation follows the consensus statement: Guidelines for Management of Incidental Pulmonary Nodules Detected on CT Images: From the Fleischner Society 2017; Radiology 2017; 284:228-243. 4. Aortic atherosclerosis, in addition to left main and left anterior descending coronary artery disease. Assessment for potential risk factor modification, dietary therapy or pharmacologic therapy may be warranted, if clinically indicated. 5. Additional incidental  findings, as above. Aortic Atherosclerosis (ICD10-I70.0). Electronically Signed   By: Vinnie Langton M.D.   On: 06/09/2018 19:50   [place] [facility]  Result Date: 06/10/2018 CLINICAL DATA:  Chronic sinus infections and blockage. EXAM: CT PARANASAL SINUS LIMITED WITHOUT CONTRAST TECHNIQUE: Non-contiguous multidetector CT images of the  paranasal sinuses were obtained in a single plane without contrast. COMPARISON:  None. FINDINGS: The sphenoid, maxillary, ethmoid, and frontal sinuses are essentially clear. Postsurgical change can be seen in the nasal cavity, status post BILATERAL maxillary antrectomies, and suspected BILATERAL ethmoidectomies. No nasal cavity masses, nor significant nasal septal deviation. Patient appears to be edentulous, there may be a subcentimeter incisor canal cyst LEFT midline in the maxilla. IMPRESSION: No significant paranasal sinus disease.  See discussion above. Electronically Signed   By: Staci Righter M.D.   On: 06/10/2018 08:39    Chart Review:    Specialty Problems      Pulmonary Problems   Allergic rhinitis    Qualifier: Diagnosis of  By: Joya Gaskins MD, Burnett Harry   Seems to flare from summer to fall and winter to spring      COPD GOLD II D    Gold stage C. COPD Spirometry 11/12/2012: FEV1 53% predicted  FEV1/ FVC ratio 54% predicted  07/06/2018-pulmonary function test-FVC 2.74 (71% predicted), postbronchodilator ratio 64, FEV1 61, DLCO 53, patient used trelegy before testing.        Sinusitis, acute, maxillary   COPD exacerbation ([place])      No Known Allergies  Immunization History  Administered Date(s) Administered  . Influenza Split 06/02/2012  . Influenza Whole 05/15/2010, 05/02/2011, 05/18/2013  . Influenza, High Dose Seasonal PF 04/20/2017, 05/05/2018  . Influenza,inj,Quad PF,6+ Mos 05/14/2014, 05/25/2015  . Influenza-Unspecified 05/15/2016  . Pneumococcal Conjugate-13 05/20/2013, 05/15/2016  . Pneumococcal Polysaccharide-23 07/03/2001, 05/02/2011, 05/20/2018  . Td 05/15/2004  . Tdap 02/17/2014  . Zoster 07/16/2011    Past Medical History:  Diagnosis Date  . Allergic rhinitis   . Atypical chest pain 12/01/2014  . Back pain 01/12/2013  . COPD (chronic obstructive pulmonary disease) (HCC)    FeV1 64%-2007  . Emphysema   . GERD (gastroesophageal reflux disease)   .  History of lung abscess    bronchiectasis with RMLandRLL ersection -1996- Dr Arlyce Dice  . Hordeolum externum (stye) 04/23/2015   Right eye  . Hypertension   . Impaired vision    glasses  . Osteoarthritis    hands and knees  . Sinusitis, acute 04/23/2015    Tobacco History: Social History   Tobacco Use  Smoking Status Former Smoker  . Packs/day: 1.50  . Years: 38.00  . Pack years: 57.00  . Types: Cigarettes  . Last attempt to quit: 09/03/2000  . Years since quitting: 17.8  Smokeless Tobacco Never Used   Counseling given: Yes   Outpatient Encounter Medications as of 07/06/2018  Medication Sig  . albuterol (PROVENTIL HFA;VENTOLIN HFA) 108 (90 Base) MCG/ACT inhaler Inhale 2 puffs into the lungs every 6 (six) hours as needed for wheezing or shortness of breath.  Marland Kitchen amLODipine (NORVASC) 5 MG tablet Take 1 tablet (5 mg total) by mouth daily.  Marland Kitchen aspirin-acetaminophen-caffeine (EXCEDRIN MIGRAINE) 250-250-65 MG per tablet Take 2 tablets by mouth every 6 (six) hours as needed (arthritis pain).   . calcium elemental as carbonate (TUMS ULTRA 1000) 400 MG chewable tablet Chew 1,000 mg by mouth 2 (two) times daily.  . Capsaicin-Menthol (SALONPAS GEL EX) Apply 1 application topically daily as needed (arthritis pain).  Marland Kitchen  fluticasone (FLONASE) 50 MCG/ACT nasal spray USE 2 SPRAYS IN EACH NOSTRIL ONE TIME DAILY  . Glucosamine-Chondroitin 750-600 MG TABS Take 1 tablet by mouth 2 (two) times daily.  . Guaifenesin (MUCINEX MAXIMUM STRENGTH) 1200 MG TB12 Take 600 mg by mouth 2 (two) times daily.  Marland Kitchen levofloxacin (LEVAQUIN) 500 MG tablet Take 1 tablet (500 mg total) by mouth daily.  Marland Kitchen loratadine (CLARITIN) 10 MG tablet Take 10 mg by mouth daily.  . meloxicam (MOBIC) 15 MG tablet TAKE 1 TABLET DAILY WITH FOOD AS NEEDED FOR PAIN  . Menthol (ROBITUSSIN COUGH DROPS MT) Use as directed 1 lozenge in the mouth or throat 4 (four) times daily as needed (cough).   . Menthol, Topical Analgesic, (BLUE-EMU MAXIMUM  STRENGTH EX) Apply 1 application topically daily as needed (arthritis pain).  . montelukast (SINGULAIR) 10 MG tablet Take 1 tablet (10 mg total) by mouth daily.  . Multiple Vitamin (MULTIVITAMIN WITH MINERALS) TABS tablet Take 1 tablet by mouth on Monday, Wednesday, Friday with supper   . Neomy-Bacit-Polymyx-Pramoxine (TRIPLE ANTIBIOTIC PAIN RELIEF EX) Apply 1 application topically daily as needed (wound care).  . TRELEGY ELLIPTA 100-62.5-25 MCG/INH AEPB INHALE 1 PUFF INTO THE LUNGS DAILY.  . [DISCONTINUED] Fluticasone-Umeclidin-Vilant (TRELEGY ELLIPTA) 100-62.5-25 MCG/INH AEPB Inhale 1 puff into the lungs daily.  . [DISCONTINUED] predniSONE (DELTASONE) 10 MG tablet 4 tabs for 3 days, then 3 tabs for 3 days, 2 tabs for 3 days, then 1 tab for 3 days, then stop  . nitroGLYCERIN (NITRODUR - DOSED IN MG/24 HR) 0.2 mg/hr patch Apply 1/4th patch to affected shoulder, change daily (Patient not taking: Reported on 07/06/2018)  . [DISCONTINUED] amoxicillin-clavulanate (AUGMENTIN) 875-125 MG tablet Take 1 tablet by mouth 2 (two) times daily.  . [DISCONTINUED] imiquimod (ALDARA) 5 % cream   . [DISCONTINUED] predniSONE (DELTASONE) 10 MG tablet Take 1 tablet (10 mg total) by mouth daily with breakfast. (Patient not taking: Reported on 07/06/2018)   No facility-administered encounter medications on file as of 07/06/2018.      Review of Systems  Review of Systems  Constitutional: Positive for fatigue. Negative for activity change, chills, fever and unexpected weight change.  HENT: Positive for congestion. Negative for postnasal drip, rhinorrhea, sinus pressure, sinus pain, sneezing and sore throat.   Eyes: Negative.   Respiratory: Positive for cough (productive - clear / white ). Negative for shortness of breath and wheezing.   Cardiovascular: Negative for chest pain and palpitations.  Gastrointestinal: Negative for constipation, diarrhea, nausea and vomiting.  Endocrine: Negative.   Musculoskeletal:  Negative.   Skin: Negative.   Neurological: Negative for dizziness and headaches.  Psychiatric/Behavioral: Negative.  Negative for dysphoric mood. The patient is not nervous/anxious.   All other systems reviewed and are negative.    Physical Exam  BP (!) 148/68 (BP Location: Left Arm, Cuff Size: Normal)   Pulse 91   Ht 5' 6.75" (1.695 m)   Wt 151 lb (68.5 kg)   SpO2 96%   BMI 23.83 kg/m   Wt Readings from Last 5 Encounters:  07/06/18 151 lb (68.5 kg)  06/09/18 150 lb 9.6 oz (68.3 kg)  05/20/18 146 lb 6.4 oz (66.4 kg)  05/05/18 149 lb 9.6 oz (67.9 kg)  12/30/17 150 lb (68 kg)    Physical Exam  Constitutional: He is oriented to person, place, and time and well-developed, well-nourished, and in no distress. No distress.  HENT:  Head: Normocephalic and atraumatic.  Right Ear: Hearing, tympanic membrane, external ear and ear canal normal.  Left Ear: Hearing, tympanic membrane, external ear and ear canal normal.  Nose: Right sinus exhibits no maxillary sinus tenderness and no frontal sinus tenderness. Left sinus exhibits frontal sinus tenderness. Left sinus exhibits no maxillary sinus tenderness.  Mouth/Throat: Uvula is midline and oropharynx is clear and moist. No oropharyngeal exudate.  Eyes: Pupils are equal, round, and reactive to light.  Neck: Normal range of motion. Neck supple. No JVD present.  Cardiovascular: Normal rate, regular rhythm and normal heart sounds.  Pulmonary/Chest: Effort normal. No accessory muscle usage. No respiratory distress. He has decreased breath sounds in the right lower field. He has no wheezes. He has no rhonchi.  Musculoskeletal: Normal range of motion. He exhibits no edema.  Lymphadenopathy:    He has no cervical adenopathy.  Neurological: He is alert and oriented to person, place, and time. Gait normal.  Skin: Skin is warm and dry. He is not diaphoretic. No erythema.  Psychiatric: Mood, memory, affect and judgment normal.  Nursing note and  vitals reviewed.    Lab Results:  CBC    Component Value Date/Time   WBC 7.5 09/15/2017 1148   RBC 4.52 09/15/2017 1148   HGB 14.0 09/15/2017 1148   HCT 41.8 09/15/2017 1148   PLT 340.0 09/15/2017 1148   MCV 92.4 09/15/2017 1148   MCH 31.0 09/30/2016 0535   MCHC 33.4 09/15/2017 1148   RDW 13.2 09/15/2017 1148   LYMPHSABS 1.0 10/11/2016 1209   MONOABS 0.3 10/11/2016 1209   EOSABS 0.0 10/11/2016 1209   BASOSABS 0.0 10/11/2016 1209    BMET    Component Value Date/Time   NA 138 09/15/2017 1148   K 4.6 09/15/2017 1148   CL 103 09/15/2017 1148   CO2 29 09/15/2017 1148   GLUCOSE 79 09/15/2017 1148   BUN 24 (H) 09/15/2017 1148   CREATININE 0.75 09/15/2017 1148   CREATININE 0.75 07/22/2014 1031   CALCIUM 9.4 09/15/2017 1148   GFRNONAA >60 09/30/2016 0535   GFRNONAA >89 07/22/2014 1031   GFRAA >60 09/30/2016 0535   GFRAA >89 07/22/2014 1031    BNP No results found for: BNP  ProBNP    Component Value Date/Time   PROBNP 18.0 07/20/2013 1403      Assessment & Plan:   72 year old male patient completing follow-up today.  Reviewed pulmonary function test results with patient.  We will also repeat CT in January/2020 to follow the nodule found on high-res CT.  Patient to continue Trelegy Ellipta.  Patient to follow-up in 3 months.  Offered patient referral to pulmonary rehab he declined.  Patient reports that he will try to increase his activity on his own.  Allergic rhinitis Continue Zyrtec, Claritin, Flonase, Singulair  Abnormal findings on diagnostic imaging of lung Follow-up CT without contrast in January/2020  COPD GOLD II D Continue Trelegy Ellipta  >>> 1 puff daily in the morning >>>rinse mouth out after use  >>> This inhaler contains 3 medications that help manage her respiratory status, contact our office if you cannot afford this medication or unable to remain on this medication  Repeat CT in Jan/2020  Follow-up in 3 months     Lauraine Rinne,  NP 07/06/2018

## 2018-07-06 ENCOUNTER — Encounter: Payer: Self-pay | Admitting: Pulmonary Disease

## 2018-07-06 ENCOUNTER — Ambulatory Visit: Payer: Medicare HMO | Admitting: Pulmonary Disease

## 2018-07-06 ENCOUNTER — Ambulatory Visit (INDEPENDENT_AMBULATORY_CARE_PROVIDER_SITE_OTHER): Payer: Medicare HMO | Admitting: Pulmonary Disease

## 2018-07-06 VITALS — BP 148/68 | HR 91 | Ht 66.75 in | Wt 151.0 lb

## 2018-07-06 DIAGNOSIS — R911 Solitary pulmonary nodule: Secondary | ICD-10-CM | POA: Insufficient documentation

## 2018-07-06 DIAGNOSIS — J449 Chronic obstructive pulmonary disease, unspecified: Secondary | ICD-10-CM

## 2018-07-06 DIAGNOSIS — J418 Mixed simple and mucopurulent chronic bronchitis: Secondary | ICD-10-CM

## 2018-07-06 DIAGNOSIS — J441 Chronic obstructive pulmonary disease with (acute) exacerbation: Secondary | ICD-10-CM | POA: Diagnosis not present

## 2018-07-06 DIAGNOSIS — K219 Gastro-esophageal reflux disease without esophagitis: Secondary | ICD-10-CM

## 2018-07-06 DIAGNOSIS — R918 Other nonspecific abnormal finding of lung field: Secondary | ICD-10-CM | POA: Diagnosis not present

## 2018-07-06 DIAGNOSIS — J301 Allergic rhinitis due to pollen: Secondary | ICD-10-CM | POA: Diagnosis not present

## 2018-07-06 LAB — PULMONARY FUNCTION TEST
DL/VA % pred: 77 %
DL/VA: 3.41 ml/min/mmHg/L
DLCO unc % pred: 53 %
DLCO unc: 15.04 ml/min/mmHg
FEF 25-75 Post: 1.11 L/sec
FEF 25-75 Pre: 0.95 L/sec
FEF2575-%Change-Post: 17 %
FEF2575-%Pred-Post: 52 %
FEF2575-%Pred-Pre: 44 %
FEV1-%Change-Post: 4 %
FEV1-%Pred-Post: 63 %
FEV1-%Pred-Pre: 61 %
FEV1-Post: 1.79 L
FEV1-Pre: 1.72 L
FEV1FVC-%Change-Post: 2 %
FEV1FVC-%Pred-Pre: 85 %
FEV6-%Change-Post: 2 %
FEV6-%Pred-Post: 77 %
FEV6-%Pred-Pre: 75 %
FEV6-Post: 2.79 L
FEV6-Pre: 2.72 L
FEV6FVC-%Change-Post: 0 %
FEV6FVC-%Pred-Post: 106 %
FEV6FVC-%Pred-Pre: 105 %
FVC-%Change-Post: 1 %
FVC-%Pred-Post: 72 %
FVC-%Pred-Pre: 71 %
FVC-Post: 2.79 L
FVC-Pre: 2.74 L
Post FEV1/FVC ratio: 64 %
Post FEV6/FVC ratio: 100 %
Pre FEV1/FVC ratio: 63 %
Pre FEV6/FVC Ratio: 99 %
RV % pred: 85 %
RV: 1.96 L
TLC % pred: 76 %
TLC: 4.86 L

## 2018-07-06 NOTE — Progress Notes (Signed)
PFT done today. 

## 2018-07-06 NOTE — Progress Notes (Signed)
Discussed results with patient in office.  Nothing further is needed at this time.  Brian Mack FNP  

## 2018-07-06 NOTE — Assessment & Plan Note (Signed)
Continue Zyrtec, Claritin, Flonase, Singulair

## 2018-07-06 NOTE — Patient Instructions (Signed)
Continue Trelegy Ellipta  >>> 1 puff daily in the morning >>>rinse mouth out after use  >>> This inhaler contains 3 medications that help manage her respiratory status, contact our office if you cannot afford this medication or unable to remain on this medication  Repeat CT in Jan/2020  Follow-up in 3 months  November/2019 we will be moving! We will no longer be at our [place] location.  Be on the look out for a post card/mailer to let you know we have officially moved.  Our new address and phone number will be:  [address] [place], [place] [address] Telephone number: [phone]  It is flu season:   >>>Remember to be washing your hands regularly, using hand sanitizer, be careful to use around herself with has contact with people who are sick will increase her chances of getting sick yourself. >>> Best ways to protect herself from the flu: Receive the yearly flu vaccine, practice good hand hygiene washing with soap and also using hand sanitizer when available, eat a nutritious meals, get adequate rest, hydrate appropriately   Please contact the office if your symptoms worsen or you have concerns that you are not improving.   Thank you for choosing [facility] for your healthcare, and for allowing [place] to partner with you on your healthcare journey. I am thankful to be able to provide care to you today.   Wyn Quaker FNP-C

## 2018-07-06 NOTE — Assessment & Plan Note (Signed)
Continue Trelegy Ellipta  >>> 1 puff daily in the morning >>>rinse mouth out after use  >>> This inhaler contains 3 medications that help manage her respiratory status, contact our office if you cannot afford this medication or unable to remain on this medication  Repeat CT in Jan/2020  Follow-up in 3 months

## 2018-07-06 NOTE — Assessment & Plan Note (Signed)
Follow-up CT without contrast in January/2020

## 2018-07-18 ENCOUNTER — Other Ambulatory Visit: Payer: Self-pay | Admitting: Family Medicine

## 2018-07-21 DIAGNOSIS — H43823 Vitreomacular adhesion, bilateral: Secondary | ICD-10-CM | POA: Diagnosis not present

## 2018-07-27 LAB — RESPIRATORY CULTURE OR RESPIRATORY AND SPUTUM CULTURE
MICRO NUMBER:: 91208586
RESULT:: NORMAL
SPECIMEN QUALITY:: ADEQUATE

## 2018-07-27 LAB — MYCOBACTERIA,CULT W/FLUOROCHROME SMEAR
MICRO NUMBER:: 91208585
SMEAR:: NONE SEEN
SPECIMEN QUALITY:: ADEQUATE

## 2018-08-14 ENCOUNTER — Telehealth: Payer: Self-pay | Admitting: Pulmonary Disease

## 2018-08-14 NOTE — Telephone Encounter (Signed)
Called and spoke with patient, he stated he had a missed call from our office. [facility] did you call to schedule his CT

## 2018-08-14 NOTE — Telephone Encounter (Signed)
[name] has spoke to this patient at 12;30 08/14/18

## 2018-08-27 DIAGNOSIS — H43823 Vitreomacular adhesion, bilateral: Secondary | ICD-10-CM | POA: Diagnosis not present

## 2018-09-07 ENCOUNTER — Ambulatory Visit: Payer: Medicare HMO | Admitting: Primary Care

## 2018-09-07 ENCOUNTER — Encounter: Payer: Self-pay | Admitting: Primary Care

## 2018-09-07 VITALS — BP 140/68 | HR 63 | Temp 98.3°F | Ht 67.0 in | Wt 152.6 lb

## 2018-09-07 DIAGNOSIS — J441 Chronic obstructive pulmonary disease with (acute) exacerbation: Secondary | ICD-10-CM | POA: Diagnosis not present

## 2018-09-07 DIAGNOSIS — J329 Chronic sinusitis, unspecified: Secondary | ICD-10-CM

## 2018-09-07 MED ORDER — AMOXICILLIN-POT CLAVULANATE 875-125 MG PO TABS: 1.0000 | ORAL_TABLET | Freq: Two times a day (BID) | ORAL | 0 refills | Status: DC

## 2018-09-07 MED ORDER — PREDNISONE 10 MG PO TABS: ORAL_TABLET | ORAL | 0 refills | Status: DC

## 2018-09-07 NOTE — Progress Notes (Signed)
@Patient  ID: [name], male    DOB: 04-20-1946, 73 y.o.   MRN: [id number]  Chief Complaint  Patient presents with  . Acute Visit    brown nasal mucus, cough with brown mucus, wheezing, SOB with exertion x5 days    Referring provider: Mosie Lukes, MD  HPI: 73 year old male, former smoker quit in 2002 (57-pack-year history).  Past medical history significant for COPD Gold II, lung abscess s/p RML & RLL resection, sinusitis, allergic rhinitis, GERD, hypertension, right bundle branch block.  Spirometry in 2014 with  FEV1 53 %, ratio 54. Patient of Dr. Elsworth Soho, last seen by pulmonary NP on 07/06/2018.  Maintained on Trelegy Ellipta.    09/07/2018 Patient presents today with acute complaints of nasal congestion and cough with brown mucus x 5 days. Continues Trelegy, has not used rescue inhaler. Takes Claritin and Singulair daily. Using sinus rinse in morning. Last abx was back in November. States that he typically needs to come back for additional prednisone course. Denies hemoptysis. Afebrile.    No Known Allergies  Immunization History  Administered Date(s) Administered  . Influenza Split 06/02/2012  . Influenza Whole 05/15/2010, 05/02/2011, 05/18/2013  . Influenza, High Dose Seasonal PF 04/20/2017, 05/05/2018  . Influenza,inj,Quad PF,6+ Mos 05/14/2014, 05/25/2015  . Influenza-Unspecified 05/15/2016  . Pneumococcal Conjugate-13 05/20/2013, 05/15/2016  . Pneumococcal Polysaccharide-23 07/03/2001, 05/02/2011, 05/20/2018  . Td 05/15/2004  . Tdap 02/17/2014  . Zoster 07/16/2011    Past Medical History:  Diagnosis Date  . Allergic rhinitis   . Atypical chest pain 12/01/2014  . Back pain 01/12/2013  . COPD (chronic obstructive pulmonary disease) (HCC)    FeV1 64%-2007  . Emphysema   . GERD (gastroesophageal reflux disease)   . History of lung abscess    bronchiectasis with RMLandRLL ersection -1996- Dr Arlyce Dice  . Hordeolum externum (stye) 04/23/2015   Right eye  . Hypertension     . Impaired vision    glasses  . Osteoarthritis    hands and knees  . Sinusitis, acute 04/23/2015    Tobacco History: Social History   Tobacco Use  Smoking Status Former Smoker  . Packs/day: 1.50  . Years: 38.00  . Pack years: 57.00  . Types: Cigarettes  . Last attempt to quit: 09/03/2000  . Years since quitting: 18.0  Smokeless Tobacco Never Used   Counseling given: Not Answered   Outpatient Medications Prior to Visit  Medication Sig Dispense Refill  . albuterol (PROVENTIL HFA;VENTOLIN HFA) 108 (90 Base) MCG/ACT inhaler Inhale 2 puffs into the lungs every 6 (six) hours as needed for wheezing or shortness of breath. 3 Inhaler 3  . amLODipine (NORVASC) 5 MG tablet Take 1 tablet (5 mg total) by mouth daily. 90 tablet 2  . aspirin-acetaminophen-caffeine (EXCEDRIN MIGRAINE) 563-875-64 MG per tablet Take 2 tablets by mouth every 6 (six) hours as needed (arthritis pain).     . calcium elemental as carbonate (TUMS ULTRA 1000) 400 MG chewable tablet Chew 1,000 mg by mouth 2 (two) times daily.    . Capsaicin-Menthol (SALONPAS GEL EX) Apply 1 application topically daily as needed (arthritis pain).    . fluticasone (FLONASE) 50 MCG/ACT nasal spray USE 2 SPRAYS IN EACH NOSTRIL ONE TIME DAILY 48 g 0  . Glucosamine-Chondroitin 750-600 MG TABS Take 1 tablet by mouth 2 (two) times daily.    . Guaifenesin (MUCINEX MAXIMUM STRENGTH) 1200 MG TB12 Take 600 mg by mouth 2 (two) times daily.    Marland Kitchen loratadine (CLARITIN) 10 MG tablet Take  10 mg by mouth daily.    . meloxicam (MOBIC) 15 MG tablet TAKE 1 TABLET DAILY WITH FOOD AS NEEDED FOR PAIN 90 tablet 0  . Menthol (ROBITUSSIN COUGH DROPS MT) Use as directed 1 lozenge in the mouth or throat 4 (four) times daily as needed (cough).     . Menthol, Topical Analgesic, (BLUE-EMU MAXIMUM STRENGTH EX) Apply 1 application topically daily as needed (arthritis pain).    . montelukast (SINGULAIR) 10 MG tablet Take 1 tablet (10 mg total) by mouth daily. 90 tablet 3  .  Multiple Vitamin (MULTIVITAMIN WITH MINERALS) TABS tablet Take 1 tablet by mouth on Monday, Wednesday, Friday with supper     . Neomy-Bacit-Polymyx-Pramoxine (TRIPLE ANTIBIOTIC PAIN RELIEF EX) Apply 1 application topically daily as needed (wound care).    . nitroGLYCERIN (NITRODUR - DOSED IN MG/24 HR) 0.2 mg/hr patch Apply 1/4th patch to affected shoulder, change daily 30 patch 1  . TRELEGY ELLIPTA 100-62.5-25 MCG/INH AEPB INHALE 1 PUFF INTO THE LUNGS DAILY. 180 each 3  . levofloxacin (LEVAQUIN) 500 MG tablet Take 1 tablet (500 mg total) by mouth daily. 5 tablet 0   No facility-administered medications prior to visit.     Review of Systems  Review of Systems  Constitutional: Negative.   HENT: Positive for congestion, sinus pressure, sinus pain and sore throat.   Respiratory: Positive for cough, shortness of breath and wheezing.   Cardiovascular: Negative.     Physical Exam  BP 140/68 (BP Location: Left Arm, Cuff Size: Normal)   Pulse 63   Temp 98.3 F (36.8 C)   Ht 5\' 7"  (1.702 m)   Wt 152 lb 9.6 oz (69.2 kg)   SpO2 95%   BMI 23.90 kg/m  Physical Exam Constitutional:      Appearance: Normal appearance. He is not ill-appearing.  HENT:     Head: Normocephalic and atraumatic.     Right Ear: Tympanic membrane normal.     Left Ear: Tympanic membrane normal.     Mouth/Throat:     Mouth: Mucous membranes are moist.     Pharynx: Oropharynx is clear.  Eyes:     Extraocular Movements: Extraocular movements intact.     Pupils: Pupils are equal, round, and reactive to light.  Neck:     Musculoskeletal: Normal range of motion and neck supple.  Cardiovascular:     Rate and Rhythm: Normal rate and regular rhythm.  Pulmonary:     Effort: No respiratory distress.     Comments: Scattered rhonchi and exp wheeze left lobe, diminished right lobe Musculoskeletal: Normal range of motion.  Skin:    General: Skin is warm and dry.  Neurological:     General: No focal deficit present.      Mental Status: He is alert and oriented to person, place, and time. Mental status is at baseline.  Psychiatric:        Mood and Affect: Mood normal.        Behavior: Behavior normal.        Thought Content: Thought content normal.        Judgment: Judgment normal.      Lab Results:  CBC    Component Value Date/Time   WBC 7.5 09/15/2017 1148   RBC 4.52 09/15/2017 1148   HGB 14.0 09/15/2017 1148   HCT 41.8 09/15/2017 1148   PLT 340.0 09/15/2017 1148   MCV 92.4 09/15/2017 1148   MCH 31.0 09/30/2016 0535   MCHC 33.4 09/15/2017 1148  RDW 13.2 09/15/2017 1148   LYMPHSABS 1.0 10/11/2016 1209   MONOABS 0.3 10/11/2016 1209   EOSABS 0.0 10/11/2016 1209   BASOSABS 0.0 10/11/2016 1209    BMET    Component Value Date/Time   NA 138 09/15/2017 1148   K 4.6 09/15/2017 1148   CL 103 09/15/2017 1148   CO2 29 09/15/2017 1148   GLUCOSE 79 09/15/2017 1148   BUN 24 (H) 09/15/2017 1148   CREATININE 0.75 09/15/2017 1148   CREATININE 0.75 07/22/2014 1031   CALCIUM 9.4 09/15/2017 1148   GFRNONAA >60 09/30/2016 0535   GFRNONAA >89 07/22/2014 1031   GFRAA >60 09/30/2016 0535   GFRAA >89 07/22/2014 1031    BNP No results found for: BNP  ProBNP    Component Value Date/Time   PROBNP 18.0 07/20/2013 1403    Imaging: No results found.   Assessment & Plan:   COPD exacerbation ([place]) - COPD exacerbation secondary to acute sinusitis  - Prednisone taper (4 tabs x 3 days; 3 tabs x 3 days; 2 tabs x 3 days; 1 tab x 3 days) - Continue Trelegy 1 puff daily and prn Albuterol hfa - Return if symptoms due not improve    Sinusitis, acute, maxillary - RX Augmentin 1 tab BID x 7 days - Recommend Mucinex twice daily and sinus rinses prn  - Continue Claritin and Singulair qd    Martyn Ehrich, NP 09/07/2018

## 2018-09-07 NOTE — Assessment & Plan Note (Signed)
-   RX Augmentin 1 tab BID x 7 days - Recommend Mucinex twice daily and sinus rinses prn  - Continue Claritin and Singulair qd

## 2018-09-07 NOTE — Progress Notes (Signed)
Cardiology Office Note    Date:  09/08/2018   ID:  [name], DOB 1946/07/23, MRN [id number]  PCP:  Mosie Lukes, MD  Cardiologist:  Dr. Johnsie Cancel   CC: post hospital follow up  History of Present Illness:  [name] is a 73 y.o. male with a history of COPD, lung abscess in 1996 s/p RML resection, HTN and PVCs who presents to clinic for f/u   He has a history of atypical chest pain in the setting of a URI dating back to 2014. 2D ECHO in 2014 and 2015 showed normal LV function and no valvular abnormalities. PVCs are felt to be related to lung disease.   I saw him in clinic on 07/09/16 and he was doing quite well.   He was admitted 1/28-1/29/18 for COPD exacerbation and CAP. Treated with IV antibiotics, steroids, nebulized broncho-dilators. 57 pack-year history of smoking  Seen by pulmonary 09/07/17 for acute sinusitis placed on prednisone Taper and trelegy as well as Augmentin   No history of documented CAD Retired from [employer]   Past Medical History:  Diagnosis Date  . Allergic rhinitis   . Atypical chest pain 12/01/2014  . Back pain 01/12/2013  . COPD (chronic obstructive pulmonary disease) (HCC)    FeV1 64%-2007  . Emphysema   . GERD (gastroesophageal reflux disease)   . History of lung abscess    bronchiectasis with RMLandRLL ersection -1996- Dr Arlyce Dice  . Hordeolum externum (stye) 04/23/2015   Right eye  . Hypertension   . Impaired vision    glasses  . Osteoarthritis    hands and knees  . Sinusitis, acute 04/23/2015    Past Surgical History:  Procedure Laterality Date  . HERNIA REPAIR  10/2009  . left knee    . lower back surgery  09/2008   06/2009  . LUNG SURGERY     RML andRUL removed due to bleeding and bronchiectasis and lung abcess  . NECK SURGERY  05/2008  . right foot surgery      Current Medications: Outpatient Medications Prior to Visit  Medication Sig Dispense Refill  . albuterol (PROVENTIL HFA;VENTOLIN HFA) 108 (90 Base) MCG/ACT  inhaler Inhale 2 puffs into the lungs every 6 (six) hours as needed for wheezing or shortness of breath. 3 Inhaler 3  . amLODipine (NORVASC) 5 MG tablet Take 1 tablet (5 mg total) by mouth daily. 90 tablet 2  . amoxicillin-clavulanate (AUGMENTIN) 875-125 MG tablet Take 1 tablet by mouth 2 (two) times daily. 14 tablet 0  . aspirin-acetaminophen-caffeine (EXCEDRIN MIGRAINE) 786-767-20 MG per tablet Take 2 tablets by mouth every 6 (six) hours as needed (arthritis pain).     . calcium elemental as carbonate (TUMS ULTRA 1000) 400 MG chewable tablet Chew 1,000 mg by mouth 2 (two) times daily.    . Capsaicin-Menthol (SALONPAS GEL EX) Apply 1 application topically daily as needed (arthritis pain).    . fluticasone (FLONASE) 50 MCG/ACT nasal spray USE 2 SPRAYS IN EACH NOSTRIL ONE TIME DAILY 48 g 0  . Glucosamine-Chondroitin 750-600 MG TABS Take 1 tablet by mouth 2 (two) times daily.    . Guaifenesin (MUCINEX MAXIMUM STRENGTH) 1200 MG TB12 Take 600 mg by mouth 2 (two) times daily.    Marland Kitchen loratadine (CLARITIN) 10 MG tablet Take 10 mg by mouth daily.    . meloxicam (MOBIC) 15 MG tablet TAKE 1 TABLET DAILY WITH FOOD AS NEEDED FOR PAIN 90 tablet 0  . Menthol (ROBITUSSIN COUGH DROPS  MT) Use as directed 1 lozenge in the mouth or throat 4 (four) times daily as needed (cough).     . Menthol, Topical Analgesic, (BLUE-EMU MAXIMUM STRENGTH EX) Apply 1 application topically daily as needed (arthritis pain).    . montelukast (SINGULAIR) 10 MG tablet Take 1 tablet (10 mg total) by mouth daily. 90 tablet 3  . Multiple Vitamin (MULTIVITAMIN WITH MINERALS) TABS tablet Take 1 tablet by mouth on Monday, Wednesday, Friday with supper     . Neomy-Bacit-Polymyx-Pramoxine (TRIPLE ANTIBIOTIC PAIN RELIEF EX) Apply 1 application topically daily as needed (wound care).    . nitroGLYCERIN (NITRODUR - DOSED IN MG/24 HR) 0.2 mg/hr patch Apply 1/4th patch to affected shoulder, change daily 30 patch 1  . predniSONE (DELTASONE) 10 MG tablet  Take 4 tabs po daily x 3 days; then 3 tabs daily x3 days; then 2 tabs daily x3 days; then 1 tab daily x 3 days; then stop 30 tablet 0  . TRELEGY ELLIPTA 100-62.5-25 MCG/INH AEPB INHALE 1 PUFF INTO THE LUNGS DAILY. 180 each 3   No facility-administered medications prior to visit.      Allergies:   Patient has no known allergies.   Social History   Socioeconomic History  . Marital status: Married    Spouse name: Not on file  . Number of children: Not on file  . Years of education: Not on file  . Highest education level: Not on file  Occupational History  . Occupation: retired Music therapist  Social Needs  . Financial resource strain: Not on file  . Food insecurity:    Worry: Not on file    Inability: Not on file  . Transportation needs:    Medical: Not on file    Non-medical: Not on file  Tobacco Use  . Smoking status: Former Smoker    Packs/day: 1.50    Years: 38.00    Pack years: 57.00    Types: Cigarettes    Last attempt to quit: 09/03/2000    Years since quitting: 18.0  . Smokeless tobacco: Never Used  Substance and Sexual Activity  . Alcohol use: Yes    Alcohol/week: 2.0 - 3.0 standard drinks    Types: 2 - 3 Cans of beer per week  . Drug use: No  . Sexual activity: Never  Lifestyle  . Physical activity:    Days per week: Not on file    Minutes per session: Not on file  . Stress: Not on file  Relationships  . Social connections:    Talks on phone: Not on file    Gets together: Not on file    Attends religious service: Not on file    Active member of club or organization: Not on file    Attends meetings of clubs or organizations: Not on file    Relationship status: Not on file  Other Topics Concern  . Not on file  Social History Narrative   Retired Music therapist   Patient states former smoker. 1 1/2 ppd x 38 yrs  Quit in Jan 2002   Married - 2 weeks (4th marriage)   divorced,  remarried 84-2004 (lost wife to lung ca),  remarried (divorced),     1 son  - 45 [name])   Alcohol use-yes (2-3 beers per week)             Family History:  The patient's family history includes Colon cancer in his paternal grandmother; Coronary artery disease in an other family  member; Heart failure in his mother; Obesity in his brother; Obstructive Sleep Apnea in his brother; Prostate cancer in his father; Stroke in an other family member.      ROS:   Please see the history of present illness.    ROS All other systems reviewed and are negative.   PHYSICAL EXAM:   VS:  BP (!) 174/84   Pulse 93   Ht 5\' 7"  (1.702 m)   Wt 158 lb 4.8 oz (71.8 kg)   SpO2 97%   BMI 24.79 kg/m    Affect appropriate Chronically ill male  HEENT: mild sinus tenderness  Neck supple with no adenopathy JVP normal no bruits no thyromegaly Lungs rhonchi exp wheezing and good diaphragmatic motion Heart:  S1/S2 no murmur, no rub, gallop or click PMI normal Abdomen: benighn, BS positve, no tenderness, no AAA no bruit.  No HSM or HJR Distal pulses intact with no bruits No edema Neuro non-focal Skin warm and dry No muscular weakness    Wt Readings from Last 3 Encounters:  09/08/18 158 lb 4.8 oz (71.8 kg)  09/07/18 152 lb 9.6 oz (69.2 kg)  07/06/18 151 lb (68.5 kg)      Studies/Labs Reviewed:   EKG:   09/08/17 SR rate 88 PVC ICRBBB 09/08/18 SR rate 93 PVC ICRBBB no changes   Recent Labs: 09/15/2017: ALT 17; BUN 24; Creatinine, Ser 0.75; Hemoglobin 14.0; Platelets 340.0; Potassium 4.6; Sodium 138; TSH 0.72   Lipid Panel    Component Value Date/Time   CHOL 181 09/10/2016 1428   TRIG 144.0 09/10/2016 1428   HDL 61.30 09/10/2016 1428   CHOLHDL 3 09/10/2016 1428   VLDL 28.8 09/10/2016 1428   LDLCALC 91 09/10/2016 1428   LDLDIRECT 85 07/22/2014 1031    Additional studies/ records that were reviewed today include:  2D ECHO: 01/03/2015 LV EF: 55% - 60% Study Conclusions - Left ventricle: The cavity size was normal. Wall thickness was normal. Systolic  function was normal. The estimated ejection fraction was in the range of 55% to 60%. Wall motion was normal; there were no regional wall motion abnormalities. Doppler parameters are consistent with abnormal left ventricular relaxation (grade 1 diastolic dysfunction). The E/e&' ratio is between 8-15, suggseting indeterminate LV Filling pressure. - Left atrium: The atrium was normal in size. - Right atrium: The atrium was at the upper limits of normal in size. - Inferior vena cava: The vessel was normal in size. The respirophasic diameter changes were in the normal range (>= 50%), consistent with normal central venous pressure. Impressions: - Compared to a prior echo in 2014, there do not appear to be any significant changes   ASSESSMENT & PLAN:   HTN: Well controlled.  Continue current medications and low sodium Dash type diet.    PVCs: stable. Continue BB EF normal no documented CAD  COPD: stable lung cancer screening CT negative 06/09/18   Sinusitis:  Continue Augmentin f/u [facility]    Jenkins Rouge

## 2018-09-07 NOTE — Assessment & Plan Note (Signed)
-   COPD exacerbation secondary to acute sinusitis  - Prednisone taper (4 tabs x 3 days; 3 tabs x 3 days; 2 tabs x 3 days; 1 tab x 3 days) - Continue Trelegy 1 puff daily and prn Albuterol hfa - Return if symptoms due not improve

## 2018-09-07 NOTE — Patient Instructions (Addendum)
RX: Augmentin 1 tab twice daily x 7 days  Prednisone taper (4 tabs x 3 days; 3 tabs x 3 days; 2 tabs x 3 days; 1 tab x 3 days)  Recommendations: Trelegy 1 puff daily  Continue mucinex and saline sinus rinses  Follow-up: Return if symptoms due not improve in 7-10 days

## 2018-09-08 ENCOUNTER — Ambulatory Visit: Payer: Medicare HMO | Admitting: Cardiovascular Disease

## 2018-09-08 ENCOUNTER — Encounter: Payer: Self-pay | Admitting: Cardiovascular Disease

## 2018-09-08 VITALS — BP 174/84 | HR 93 | Ht 67.0 in | Wt 158.3 lb

## 2018-09-08 DIAGNOSIS — I10 Essential (primary) hypertension: Secondary | ICD-10-CM

## 2018-09-08 DIAGNOSIS — I493 Ventricular premature depolarization: Secondary | ICD-10-CM | POA: Diagnosis not present

## 2018-09-08 DIAGNOSIS — J449 Chronic obstructive pulmonary disease, unspecified: Secondary | ICD-10-CM

## 2018-09-08 NOTE — Patient Instructions (Addendum)

## 2018-09-28 ENCOUNTER — Ambulatory Visit (HOSPITAL_BASED_OUTPATIENT_CLINIC_OR_DEPARTMENT_OTHER)
Admission: RE | Admit: 2018-09-28 | Discharge: 2018-09-28 | Disposition: A | Discharge status: Home / Self Care | Payer: Medicare HMO | Source: Ambulatory Visit | Attending: Pulmonary Disease | Admitting: Pulmonary Disease

## 2018-09-28 ENCOUNTER — Ambulatory Visit (INDEPENDENT_AMBULATORY_CARE_PROVIDER_SITE_OTHER): Payer: Medicare HMO | Admitting: Family Medicine

## 2018-09-28 VITALS — BP 142/82 | HR 82 | Temp 97.5°F | Resp 18 | Ht 67.0 in | Wt 152.6 lb

## 2018-09-28 DIAGNOSIS — Z0000 Encounter for general adult medical examination without abnormal findings: Secondary | ICD-10-CM

## 2018-09-28 DIAGNOSIS — R918 Other nonspecific abnormal finding of lung field: Secondary | ICD-10-CM

## 2018-09-28 DIAGNOSIS — J418 Mixed simple and mucopurulent chronic bronchitis: Secondary | ICD-10-CM

## 2018-09-28 DIAGNOSIS — J301 Allergic rhinitis due to pollen: Secondary | ICD-10-CM | POA: Diagnosis not present

## 2018-09-28 DIAGNOSIS — R351 Nocturia: Secondary | ICD-10-CM | POA: Diagnosis not present

## 2018-09-28 DIAGNOSIS — Z136 Encounter for screening for cardiovascular disorders: Secondary | ICD-10-CM

## 2018-09-28 DIAGNOSIS — Z79899 Other long term (current) drug therapy: Secondary | ICD-10-CM

## 2018-09-28 DIAGNOSIS — J329 Chronic sinusitis, unspecified: Secondary | ICD-10-CM

## 2018-09-28 DIAGNOSIS — R739 Hyperglycemia, unspecified: Secondary | ICD-10-CM

## 2018-09-28 DIAGNOSIS — R911 Solitary pulmonary nodule: Secondary | ICD-10-CM | POA: Diagnosis not present

## 2018-09-28 DIAGNOSIS — L989 Disorder of the skin and subcutaneous tissue, unspecified: Secondary | ICD-10-CM

## 2018-09-28 DIAGNOSIS — I10 Essential (primary) hypertension: Secondary | ICD-10-CM | POA: Diagnosis not present

## 2018-09-28 LAB — COMPREHENSIVE METABOLIC PANEL
ALT: 16 U/L (ref 0–53)
AST: 21 U/L (ref 0–37)
Albumin: 4 g/dL (ref 3.5–5.2)
Alkaline Phosphatase: 75 U/L (ref 39–117)
BUN: 27 mg/dL — ABNORMAL HIGH (ref 6–23)
CO2: 29 mEq/L (ref 19–32)
Calcium: 9.5 mg/dL (ref 8.4–10.5)
Chloride: 105 mEq/L (ref 96–112)
Creatinine, Ser: 0.87 mg/dL (ref 0.40–1.50)
GFR: 86.28 mL/min (ref >60.00)
Glucose, Bld: 88 mg/dL (ref 70–99)
Potassium: 4.6 mEq/L (ref 3.5–5.1)
Sodium: 141 mEq/L (ref 135–145)
Total Bilirubin: 0.6 mg/dL (ref 0.2–1.2)
Total Protein: 6.7 g/dL (ref 6.0–8.3)

## 2018-09-28 LAB — CBC
HCT: 42.8 % (ref 39.0–52.0)
Hemoglobin: 14.3 g/dL (ref 13.0–17.0)
MCHC: 33.5 g/dL (ref 30.0–36.0)
MCV: 93.5 fl (ref 78.0–100.0)
Platelets: 244 10*3/uL (ref 150.0–400.0)
RBC: 4.57 Mil/uL (ref 4.22–5.81)
RDW: 14.1 % (ref 11.5–15.5)
WBC: 6.1 10*3/uL (ref 4.0–10.5)

## 2018-09-28 LAB — TSH: TSH: 0.88 u[IU]/mL (ref 0.35–4.50)

## 2018-09-28 LAB — PSA: PSA: 2.06 ng/mL (ref 0.10–4.00)

## 2018-09-28 LAB — HEMOGLOBIN A1C: Hgb A1c MFr Bld: 5.6 % (ref 4.6–6.5)

## 2018-09-28 MED ORDER — MELOXICAM 15 MG PO TABS: 15.0000 mg | ORAL_TABLET | Freq: Every day | ORAL | 0 refills | Status: DC | PRN

## 2018-09-28 MED ORDER — CEFDINIR 300 MG PO CAPS: 300.0000 mg | ORAL_CAPSULE | Freq: Two times a day (BID) | ORAL | 0 refills | Status: AC

## 2018-09-28 MED ORDER — MONTELUKAST SODIUM 10 MG PO TABS: 10.0000 mg | ORAL_TABLET | Freq: Every day | ORAL | 3 refills | Status: DC

## 2018-09-28 MED ORDER — FLUTICASONE PROPIONATE 50 MCG/ACT NA SUSP: 2.0000 | Freq: Every day | NASAL | 3 refills | Status: DC | PRN

## 2018-09-28 MED ORDER — METHYLPREDNISOLONE 4 MG PO TABS: ORAL_TABLET | ORAL | 0 refills | Status: DC

## 2018-09-28 MED ORDER — IMIQUIMOD 5 % EX CREA: TOPICAL_CREAM | CUTANEOUS | 2 refills | Status: DC

## 2018-09-28 MED ORDER — AMLODIPINE BESYLATE 5 MG PO TABS: 5.0000 mg | ORAL_TABLET | Freq: Every day | ORAL | 2 refills | Status: DC

## 2018-09-28 NOTE — Progress Notes (Signed)
Subjective:    Patient ID: [name], male    DOB: 25-Oct-1945, 73 y.o.   MRN: [id number]  No chief complaint on file.   HPI Patient is in today for annual preventative exam and follow-up on chronic medical concerns including recurrent sinusitis, hypertension, anemia, allergic state and COPD.  He has struggled with a sinus infection a month since September and has needed antibiotics and steroids frequently despite taking Zyrtec in morning, Claritin at night, Singulair daily, Flonase and nasal rinses.  He has been feeling better until this week when he began to feel stuffy once again with cough and congestion.  He continues to follow with pulmonology who manages his COPD has a CAT scan later today for some nodules.  No recent febrile illness or recent hospitalization.  Is managing activities of daily living well.  Stays active and tries to maintain a heart healthy diet. Denies CP/palp/HA/fevers/GI or GU c/o. Taking meds as prescribed  Past Medical History:  Diagnosis Date  . Allergic rhinitis   . Atypical chest pain 12/01/2014  . Back pain 01/12/2013  . COPD (chronic obstructive pulmonary disease) (HCC)    FeV1 64%-2007  . Emphysema   . GERD (gastroesophageal reflux disease)   . History of lung abscess    bronchiectasis with RMLandRLL ersection -1996- Dr Arlyce Dice  . Hordeolum externum (stye) 04/23/2015   Right eye  . Hypertension   . Impaired vision    glasses  . Osteoarthritis    hands and knees  . Sinusitis, acute 04/23/2015    Past Surgical History:  Procedure Laterality Date  . HERNIA REPAIR  10/2009  . left knee    . lower back surgery  09/2008   06/2009  . LUNG SURGERY     RML andRUL removed due to bleeding and bronchiectasis and lung abcess  . NECK SURGERY  05/2008  . right foot surgery      Family History  Problem Relation Age of Onset  . Prostate cancer Father        father died prostate ca  . Coronary artery disease Other        1st degree relative<60  . Stroke  Other        1st degree relative<50  . Heart failure Mother        age 8  . Colon cancer Paternal Grandmother   . Obstructive Sleep Apnea Brother   . Obesity Brother     Social History   Socioeconomic History  . Marital status: Married    Spouse name: Not on file  . Number of children: Not on file  . Years of education: Not on file  . Highest education level: Not on file  Occupational History  . Occupation: retired Music therapist  Social Needs  . Financial resource strain: Not on file  . Food insecurity:    Worry: Not on file    Inability: Not on file  . Transportation needs:    Medical: Not on file    Non-medical: Not on file  Tobacco Use  . Smoking status: Former Smoker    Packs/day: 1.50    Years: 38.00    Pack years: 57.00    Types: Cigarettes    Last attempt to quit: 09/03/2000    Years since quitting: 18.0  . Smokeless tobacco: Never Used  Substance and Sexual Activity  . Alcohol use: Yes    Alcohol/week: 2.0 - 3.0 standard drinks    Types: 2 - 3 Cans of  beer per week  . Drug use: No  . Sexual activity: Never  Lifestyle  . Physical activity:    Days per week: Not on file    Minutes per session: Not on file  . Stress: Not on file  Relationships  . Social connections:    Talks on phone: Not on file    Gets together: Not on file    Attends religious service: Not on file    Active member of club or organization: Not on file    Attends meetings of clubs or organizations: Not on file    Relationship status: Not on file  . Intimate partner violence:    Fear of current or ex partner: Not on file    Emotionally abused: Not on file    Physically abused: Not on file    Forced sexual activity: Not on file  Other Topics Concern  . Not on file  Social History Narrative   Retired Music therapist   Patient states former smoker. 1 1/2 ppd x 38 yrs  Quit in Jan 2002   Married - 2 weeks (4th marriage)   divorced,  remarried 84-2004 (lost wife to lung  ca),  remarried (divorced),    1 son  - 1 [name])   Alcohol use-yes (2-3 beers per week)            Outpatient Medications Prior to Visit  Medication Sig Dispense Refill  . albuterol (PROVENTIL HFA;VENTOLIN HFA) 108 (90 Base) MCG/ACT inhaler Inhale 2 puffs into the lungs every 6 (six) hours as needed for wheezing or shortness of breath. 3 Inhaler 3  . aspirin-acetaminophen-caffeine (EXCEDRIN MIGRAINE) 643-329-51 MG per tablet Take 2 tablets by mouth every 6 (six) hours as needed (arthritis pain).     . calcium elemental as carbonate (TUMS ULTRA 1000) 400 MG chewable tablet Chew 1,000 mg by mouth 2 (two) times daily.    . Capsaicin-Menthol (SALONPAS GEL EX) Apply 1 application topically daily as needed (arthritis pain).    . Glucosamine-Chondroitin 750-600 MG TABS Take 1 tablet by mouth 2 (two) times daily.    . Guaifenesin (MUCINEX MAXIMUM STRENGTH) 1200 MG TB12 Take 600 mg by mouth 2 (two) times daily.    Marland Kitchen loratadine (CLARITIN) 10 MG tablet Take 10 mg by mouth daily.    . Menthol (ROBITUSSIN COUGH DROPS MT) Use as directed 1 lozenge in the mouth or throat 4 (four) times daily as needed (cough).     . Menthol, Topical Analgesic, (BLUE-EMU MAXIMUM STRENGTH EX) Apply 1 application topically daily as needed (arthritis pain).    . Multiple Vitamin (MULTIVITAMIN WITH MINERALS) TABS tablet Take 1 tablet by mouth on Monday, Wednesday, Friday with supper     . Neomy-Bacit-Polymyx-Pramoxine (TRIPLE ANTIBIOTIC PAIN RELIEF EX) Apply 1 application topically daily as needed (wound care).    . TRELEGY ELLIPTA 100-62.5-25 MCG/INH AEPB INHALE 1 PUFF INTO THE LUNGS DAILY. 180 each 3  . amLODipine (NORVASC) 5 MG tablet Take 1 tablet (5 mg total) by mouth daily. 90 tablet 2  . amoxicillin-clavulanate (AUGMENTIN) 875-125 MG tablet Take 1 tablet by mouth 2 (two) times daily. 14 tablet 0  . fluticasone (FLONASE) 50 MCG/ACT nasal spray USE 2 SPRAYS IN EACH NOSTRIL ONE TIME DAILY 48 g 0  . meloxicam (MOBIC) 15  MG tablet TAKE 1 TABLET DAILY WITH FOOD AS NEEDED FOR PAIN 90 tablet 0  . montelukast (SINGULAIR) 10 MG tablet Take 1 tablet (10 mg total) by mouth daily. 90 tablet 3  .  nitroGLYCERIN (NITRODUR - DOSED IN MG/24 HR) 0.2 mg/hr patch Apply 1/4th patch to affected shoulder, change daily 30 patch 1  . predniSONE (DELTASONE) 10 MG tablet Take 4 tabs po daily x 3 days; then 3 tabs daily x3 days; then 2 tabs daily x3 days; then 1 tab daily x 3 days; then stop 30 tablet 0   No facility-administered medications prior to visit.     No Known Allergies  Review of Systems  Constitutional: Negative for chills, fever and malaise/fatigue.  HENT: Positive for congestion and sinus pain. Negative for hearing loss.   Eyes: Negative for discharge.  Respiratory: Positive for cough, sputum production and shortness of breath.   Cardiovascular: Positive for palpitations. Negative for chest pain and leg swelling.  Gastrointestinal: Negative for abdominal pain, blood in stool, constipation, diarrhea, heartburn, nausea and vomiting.  Genitourinary: Negative for dysuria, frequency, hematuria and urgency.  Musculoskeletal: Negative for back pain, falls and myalgias.  Skin: Negative for rash.  Neurological: Negative for dizziness, sensory change, loss of consciousness, weakness and headaches.  Endo/Heme/Allergies: Negative for environmental allergies. Does not bruise/bleed easily.  Psychiatric/Behavioral: Negative for depression and suicidal ideas. The patient is not nervous/anxious and does not have insomnia.        Objective:    Physical Exam Vitals signs and nursing note reviewed.  Constitutional:      General: He is not in acute distress.    Appearance: He is well-developed.  HENT:     Head: Normocephalic and atraumatic.     Right Ear: Tympanic membrane and ear canal normal.     Left Ear: Tympanic membrane and ear canal normal.     Nose: Nose normal.  Eyes:     General:        Right eye: No discharge.         Left eye: No discharge.  Neck:     Musculoskeletal: Normal range of motion and neck supple.  Cardiovascular:     Rate and Rhythm: Normal rate and regular rhythm.     Heart sounds: No murmur.  Pulmonary:     Effort: Pulmonary effort is normal.     Breath sounds: Rhonchi present.  Abdominal:     General: Bowel sounds are normal.     Palpations: Abdomen is soft.     Tenderness: There is no abdominal tenderness.  Skin:    General: Skin is warm and dry.  Neurological:     Mental Status: He is alert and oriented to person, place, and time.     BP (!) 142/82 (BP Location: Left Arm, Patient Position: Sitting, Cuff Size: Normal)   Pulse 82   Temp (!) 97.5 F (36.4 C) (Oral)   Resp 18   Ht 5\' 7"  (1.702 m)   Wt 152 lb 9.6 oz (69.2 kg)   SpO2 96%   BMI 23.90 kg/m  Wt Readings from Last 3 Encounters:  09/28/18 152 lb 9.6 oz (69.2 kg)  09/08/18 158 lb 4.8 oz (71.8 kg)  09/07/18 152 lb 9.6 oz (69.2 kg)     Lab Results  Component Value Date   WBC 7.5 09/15/2017   HGB 14.0 09/15/2017   HCT 41.8 09/15/2017   PLT 340.0 09/15/2017   GLUCOSE 79 09/15/2017   CHOL 181 09/10/2016   TRIG 144.0 09/10/2016   HDL 61.30 09/10/2016   LDLDIRECT 85 07/22/2014   LDLCALC 91 09/10/2016   ALT 17 09/15/2017   AST 20 09/15/2017   NA 138 09/15/2017   K  4.6 09/15/2017   CL 103 09/15/2017   CREATININE 0.75 09/15/2017   BUN 24 (H) 09/15/2017   CO2 29 09/15/2017   TSH 0.72 09/15/2017   PSA 2.68 09/15/2017   INR 1.18 09/28/2016   HGBA1C 5.9 09/15/2017    Lab Results  Component Value Date   TSH 0.72 09/15/2017   Lab Results  Component Value Date   WBC 7.5 09/15/2017   HGB 14.0 09/15/2017   HCT 41.8 09/15/2017   MCV 92.4 09/15/2017   PLT 340.0 09/15/2017   Lab Results  Component Value Date   NA 138 09/15/2017   K 4.6 09/15/2017   CO2 29 09/15/2017   GLUCOSE 79 09/15/2017   BUN 24 (H) 09/15/2017   CREATININE 0.75 09/15/2017   BILITOT 0.5 09/15/2017   ALKPHOS 78 09/15/2017    AST 20 09/15/2017   ALT 17 09/15/2017   PROT 7.2 09/15/2017   ALBUMIN 3.9 09/15/2017   CALCIUM 9.4 09/15/2017   ANIONGAP 6 09/30/2016   GFR 109.16 09/15/2017   Lab Results  Component Value Date   CHOL 181 09/10/2016   Lab Results  Component Value Date   HDL 61.30 09/10/2016   Lab Results  Component Value Date   LDLCALC 91 09/10/2016   Lab Results  Component Value Date   TRIG 144.0 09/10/2016   Lab Results  Component Value Date   CHOLHDL 3 09/10/2016   Lab Results  Component Value Date   HGBA1C 5.9 09/15/2017       Assessment & Plan:   Problem List Items Addressed This Visit    Benign essential HTN    Well controlled, no changes to meds. Encouraged heart healthy diet such as the DASH diet and exercise as tolerated.       Relevant Medications   amLODipine (NORVASC) 5 MG tablet   Other Relevant Orders   CBC   Comprehensive metabolic panel   TSH   Allergic rhinitis   Relevant Orders   Ambulatory referral to ENT   COPD GOLD II D    Following with pulmonology. Breathing only acts up when ill      Relevant Medications   montelukast (SINGULAIR) 10 MG tablet   fluticasone (FLONASE) 50 MCG/ACT nasal spray   methylPREDNISolone (MEDROL) 4 MG tablet   Other Relevant Orders   CBC   Annual physical exam   Relevant Orders   TSH   Screening for ischemic heart disease    Continues to follow with cardiology and is doing well just occasional palpitations with no associated symptoms      Preventative health care    Patient encouraged to maintain heart healthy diet, regular exercise, adequate sleep. Consider daily probiotics. Take medications as prescribed. Had shingrix at pharmacy. Will request records. He brought in ACP documents today      Nocturia - Primary    Check PSA level today      Relevant Orders   PSA   Hyperglycemia    hgba1c acceptable, minimize simple carbs. Increase exercise as tolerated. Continue current meds      Relevant Orders    Hemoglobin A1c   Skin lesion    H/O of precancerous changes now with new lesions on scalp and legs reestablish with derm and refill on Aldara      Relevant Orders   Ambulatory referral to Dermatology   Abnormal findings on diagnostic imaging of lung    Has repeat CT scan ordered by pulmonology today for surveillance      Recurrent  sinusitis    Has been having monthly infections since September. Will give Cefdinir and medrol to hold onto if symptoms worsen since they have been escalating again this week and is referred to ENT/allergy for further evaluation as he already uses nasl flushes, antihistamines, Singulair and flonase daily      Relevant Medications   fluticasone (FLONASE) 50 MCG/ACT nasal spray   cefdinir (OMNICEF) 300 MG capsule   methylPREDNISolone (MEDROL) 4 MG tablet   Other Relevant Orders   Ambulatory referral to ENT   High risk medication use    Uses Denture paste and cream to excess and is worried about zinc content will check levels      Relevant Orders   Zinc      I have discontinued Fender S. Weist "Al"'s nitroGLYCERIN, predniSONE, and amoxicillin-clavulanate. I have also changed his fluticasone, meloxicam, and imiquimod. Additionally, I am having him start on cefdinir and methylPREDNISolone. Lastly, I am having him maintain his aspirin-acetaminophen-caffeine, loratadine, Menthol (ROBITUSSIN COUGH DROPS MT), Guaifenesin, Glucosamine-Chondroitin, calcium elemental as carbonate, multivitamin with minerals, Capsaicin-Menthol (SALONPAS GEL EX), (Menthol, Topical Analgesic, (BLUE-EMU MAXIMUM STRENGTH EX)), Neomy-Bacit-Polymyx-Pramoxine (TRIPLE ANTIBIOTIC PAIN RELIEF EX), albuterol, TRELEGY ELLIPTA, amLODipine, and montelukast.  Meds ordered this encounter  Medications  . amLODipine (NORVASC) 5 MG tablet    Sig: Take 1 tablet (5 mg total) by mouth daily.    Dispense:  90 tablet    Refill:  2  . montelukast (SINGULAIR) 10 MG tablet    Sig: Take 1 tablet (10 mg  total) by mouth daily.    Dispense:  90 tablet    Refill:  3  . fluticasone (FLONASE) 50 MCG/ACT nasal spray    Sig: Place 2 sprays into both nostrils daily as needed for allergies or rhinitis.    Dispense:  48 g    Refill:  3  . meloxicam (MOBIC) 15 MG tablet    Sig: Take 1 tablet (15 mg total) by mouth daily as needed for pain.    Dispense:  90 tablet    Refill:  0  . imiquimod (ALDARA) 5 % cream    Sig: Apply topically 3 (three) times a week.    Dispense:  12 each    Refill:  2  . cefdinir (OMNICEF) 300 MG capsule    Sig: Take 1 capsule (300 mg total) by mouth 2 (two) times daily for 10 days.    Dispense:  20 capsule    Refill:  0  . methylPREDNISolone (MEDROL) 4 MG tablet    Sig: 5 tab po qd X 1d then 4 tab po qd X 1d then 3 tab po qd X 1d then 2 tab po qd then 1 tab po qd    Dispense:  15 tablet    Refill:  0     Penni Homans, MD

## 2018-09-28 NOTE — Assessment & Plan Note (Signed)
hgba1c acceptable, minimize simple carbs. Increase exercise as tolerated. Continue current meds 

## 2018-09-28 NOTE — Assessment & Plan Note (Signed)
Has been having monthly infections since September. Will give Cefdinir and medrol to hold onto if symptoms worsen since they have been escalating again this week and is referred to ENT/allergy for further evaluation as he already uses nasl flushes, antihistamines, Singulair and flonase daily

## 2018-09-28 NOTE — Assessment & Plan Note (Signed)
Well controlled, no changes to meds. Encouraged heart healthy diet such as the DASH diet and exercise as tolerated.  °

## 2018-09-28 NOTE — Assessment & Plan Note (Deleted)
Patient encouraged to maintain heart healthy diet, regular exercise, adequate sleep. Consider daily probiotics. Take medications as prescribed. Brought in his completed ACP forms

## 2018-09-28 NOTE — Assessment & Plan Note (Signed)
Continues to follow with cardiology and is doing well just occasional palpitations with no associated symptoms

## 2018-09-28 NOTE — Assessment & Plan Note (Signed)
Uses Denture paste and cream to excess and is worried about zinc content will check levels

## 2018-09-28 NOTE — Assessment & Plan Note (Signed)
Has repeat CT scan ordered by pulmonology today for surveillance

## 2018-09-28 NOTE — Assessment & Plan Note (Signed)
Following with pulmonology. Breathing only acts up when ill

## 2018-09-28 NOTE — Assessment & Plan Note (Signed)
Patient encouraged to maintain heart healthy diet, regular exercise, adequate sleep. Consider daily probiotics. Take medications as prescribed. Had shingrix at pharmacy. Will request records. He brought in ACP documents today

## 2018-09-28 NOTE — Assessment & Plan Note (Signed)
Check PSA level today

## 2018-09-28 NOTE — Assessment & Plan Note (Signed)
H/O of precancerous changes now with new lesions on scalp and legs reestablish with derm and refill on Aldara

## 2018-09-28 NOTE — Patient Instructions (Signed)
Preventive Care 2 Years and Older, Male Preventive care refers to lifestyle choices and visits with your health care provider that can promote health and wellness. What does preventive care include?   A yearly physical exam. This is also called an annual well check.  Dental exams once or twice a year.  Routine eye exams. Ask your health care provider how often you should have your eyes checked.  Personal lifestyle choices, including: ? Daily care of your teeth and gums. ? Regular physical activity. ? Eating a healthy diet. ? Avoiding tobacco and drug use. ? Limiting alcohol use. ? Practicing safe sex. ? Taking low doses of aspirin every day. ? Taking vitamin and mineral supplements as recommended by your health care provider. What happens during an annual well check? The services and screenings done by your health care provider during your annual well check will depend on your age, overall health, lifestyle risk factors, and family history of disease. Counseling Your health care provider may ask you questions about your:  Alcohol use.  Tobacco use.  Drug use.  Emotional well-being.  Home and relationship well-being.  Sexual activity.  Eating habits.  History of falls.  Memory and ability to understand (cognition).  Work and work Statistician. Screening You may have the following tests or measurements:  Height, weight, and BMI.  Blood pressure.  Lipid and cholesterol levels. These may be checked every 5 years, or more frequently if you are over 9 years old.  Skin check.  Lung cancer screening. You may have this screening every year starting at age 57 if you have a 30-pack-year history of smoking and currently smoke or have quit within the past 15 years.  Colorectal cancer screening. All adults should have this screening starting at age 90 and continuing until age 69. You will have tests every 1-10 years, depending on your results and the type of screening  test. People at increased risk should start screening at an earlier age. Screening tests may include: ? Guaiac-based fecal occult blood testing. ? Fecal immunochemical test (FIT). ? Stool DNA test. ? Virtual colonoscopy. ? Sigmoidoscopy. During this test, a flexible tube with a tiny camera (sigmoidoscope) is used to examine your rectum and lower colon. The sigmoidoscope is inserted through your anus into your rectum and lower colon. ? Colonoscopy. During this test, a long, thin, flexible tube with a tiny camera (colonoscope) is used to examine your entire colon and rectum.  Prostate cancer screening. Recommendations will vary depending on your family history and other risks.  Hepatitis C blood test.  Hepatitis B blood test.  Sexually transmitted disease (STD) testing.  Diabetes screening. This is done by checking your blood sugar (glucose) after you have not eaten for a while (fasting). You may have this done every 1-3 years.  Abdominal aortic aneurysm (AAA) screening. You may need this if you are a current or former smoker.  Osteoporosis. You may be screened starting at age 30 if you are at high risk. Talk with your health care provider about your test results, treatment options, and if necessary, the need for more tests. Vaccines Your health care provider may recommend certain vaccines, such as:  Influenza vaccine. This is recommended every year.  Tetanus, diphtheria, and acellular pertussis (Tdap, Td) vaccine. You may need a Td booster every 10 years.  Varicella vaccine. You may need this if you have not been vaccinated.  Zoster vaccine. You may need this after age 42.  Measles, mumps, and rubella (MMR) vaccine.  You may need at least one dose of MMR if you were born in 1957 or later. You may also need a second dose.  Pneumococcal 13-valent conjugate (PCV13) vaccine. One dose is recommended after age 65.  Pneumococcal polysaccharide (PPSV23) vaccine. One dose is recommended  after age 65.  Meningococcal vaccine. You may need this if you have certain conditions.  Hepatitis A vaccine. You may need this if you have certain conditions or if you travel or work in places where you may be exposed to hepatitis A.  Hepatitis B vaccine. You may need this if you have certain conditions or if you travel or work in places where you may be exposed to hepatitis B.  Haemophilus influenzae type b (Hib) vaccine. You may need this if you have certain risk factors. Talk to your health care provider about which screenings and vaccines you need and how often you need them. This information is not intended to replace advice given to you by your health care provider. Make sure you discuss any questions you have with your health care provider. Document Released: 09/15/2015 Document Revised: 10/09/2017 Document Reviewed: 06/20/2015 Elsevier Interactive Patient Education  2019 [employer] Inc[employer]  

## 2018-09-29 ENCOUNTER — Other Ambulatory Visit: Payer: Self-pay

## 2018-09-29 ENCOUNTER — Telehealth: Payer: Self-pay | Admitting: Nurse Practitioner

## 2018-09-29 DIAGNOSIS — R918 Other nonspecific abnormal finding of lung field: Secondary | ICD-10-CM

## 2018-09-29 NOTE — Progress Notes (Signed)
If patient symptomatically is doing well then yes he can cancel his office visit with Tonya.  Since is been a while since patient was seen by his primary pulmonologist which is Dr. Elsworth Soho I would recommend the patient be scheduled to see Dr. Elsworth Soho in the next 6 to 8 weeks for general follow-up office visit.  [name]

## 2018-09-29 NOTE — Progress Notes (Signed)
Lauren please contact the patient.  Let them know that CT shows that his previous left lower lobe nodule is decreased to 5 mm.  This is good news.  There are no new or enlarging pulmonary nodules.  We will consider a CT in 18 months.  Patient has been seen by me multiple times.  For continuity of care can we offer to the patient to follow-up with his pulmonologist or with me as I have seen him multiple times.    Wyn Quaker, FNP

## 2018-09-29 NOTE — Telephone Encounter (Signed)
This call has been handled via result notes nothing further is needed at this time.

## 2018-09-30 LAB — ZINC: Zinc: 80 ug/dL (ref 60–130)

## 2018-10-06 ENCOUNTER — Ambulatory Visit: Payer: Medicare HMO | Admitting: Pulmonary Disease

## 2018-10-06 ENCOUNTER — Ambulatory Visit: Payer: Medicare HMO | Admitting: Nurse Practitioner

## 2018-10-22 DIAGNOSIS — J329 Chronic sinusitis, unspecified: Secondary | ICD-10-CM | POA: Diagnosis not present

## 2018-11-26 ENCOUNTER — Telehealth: Payer: Self-pay | Admitting: Family Medicine

## 2018-11-26 NOTE — Telephone Encounter (Signed)
Please advise I do nor see a medication that he is speaking of

## 2018-11-26 NOTE — Telephone Encounter (Signed)
Pt came into the office this morning, states that he had an appt with his dermatologist this morning, and when ask the screening questions, pt answered yes to "coughing" was told that he could not be seen today due to the COVID 19, pt states he as bronchitis for years. I explained the procedures to pt and pt understood, however pt states that the leisions on his body has come back. Pt states Dr. Charlett Blake had written him a rx a while back and he wants to know if he can aleast that the rx called in again for him until he is able to get in to see the dermatologist. Please advise.

## 2018-11-26 NOTE — Telephone Encounter (Signed)
I Think he must mean Aldara (Imiquimod) cream. Sig apply 3-5 x week to affected area qhs x 6 weeks disp QS with 1 rf. If he does not think that is it he will have to check what it was. Aske where is lesion is.

## 2018-11-27 MED ORDER — IMIQUIMOD 5 % EX CREA: TOPICAL_CREAM | CUTANEOUS | 2 refills | Status: DC

## 2018-11-27 NOTE — Telephone Encounter (Signed)
Spoke with patient sent medication in.  

## 2018-12-02 IMAGING — DX DG CHEST 2V
2 series · 2 of 2 positions shown · non-contrast
Comparison: 04/24/2015.

CLINICAL DATA: Increased wheezing and cough.

EXAM:
CHEST  2 VIEW

[chest pa]
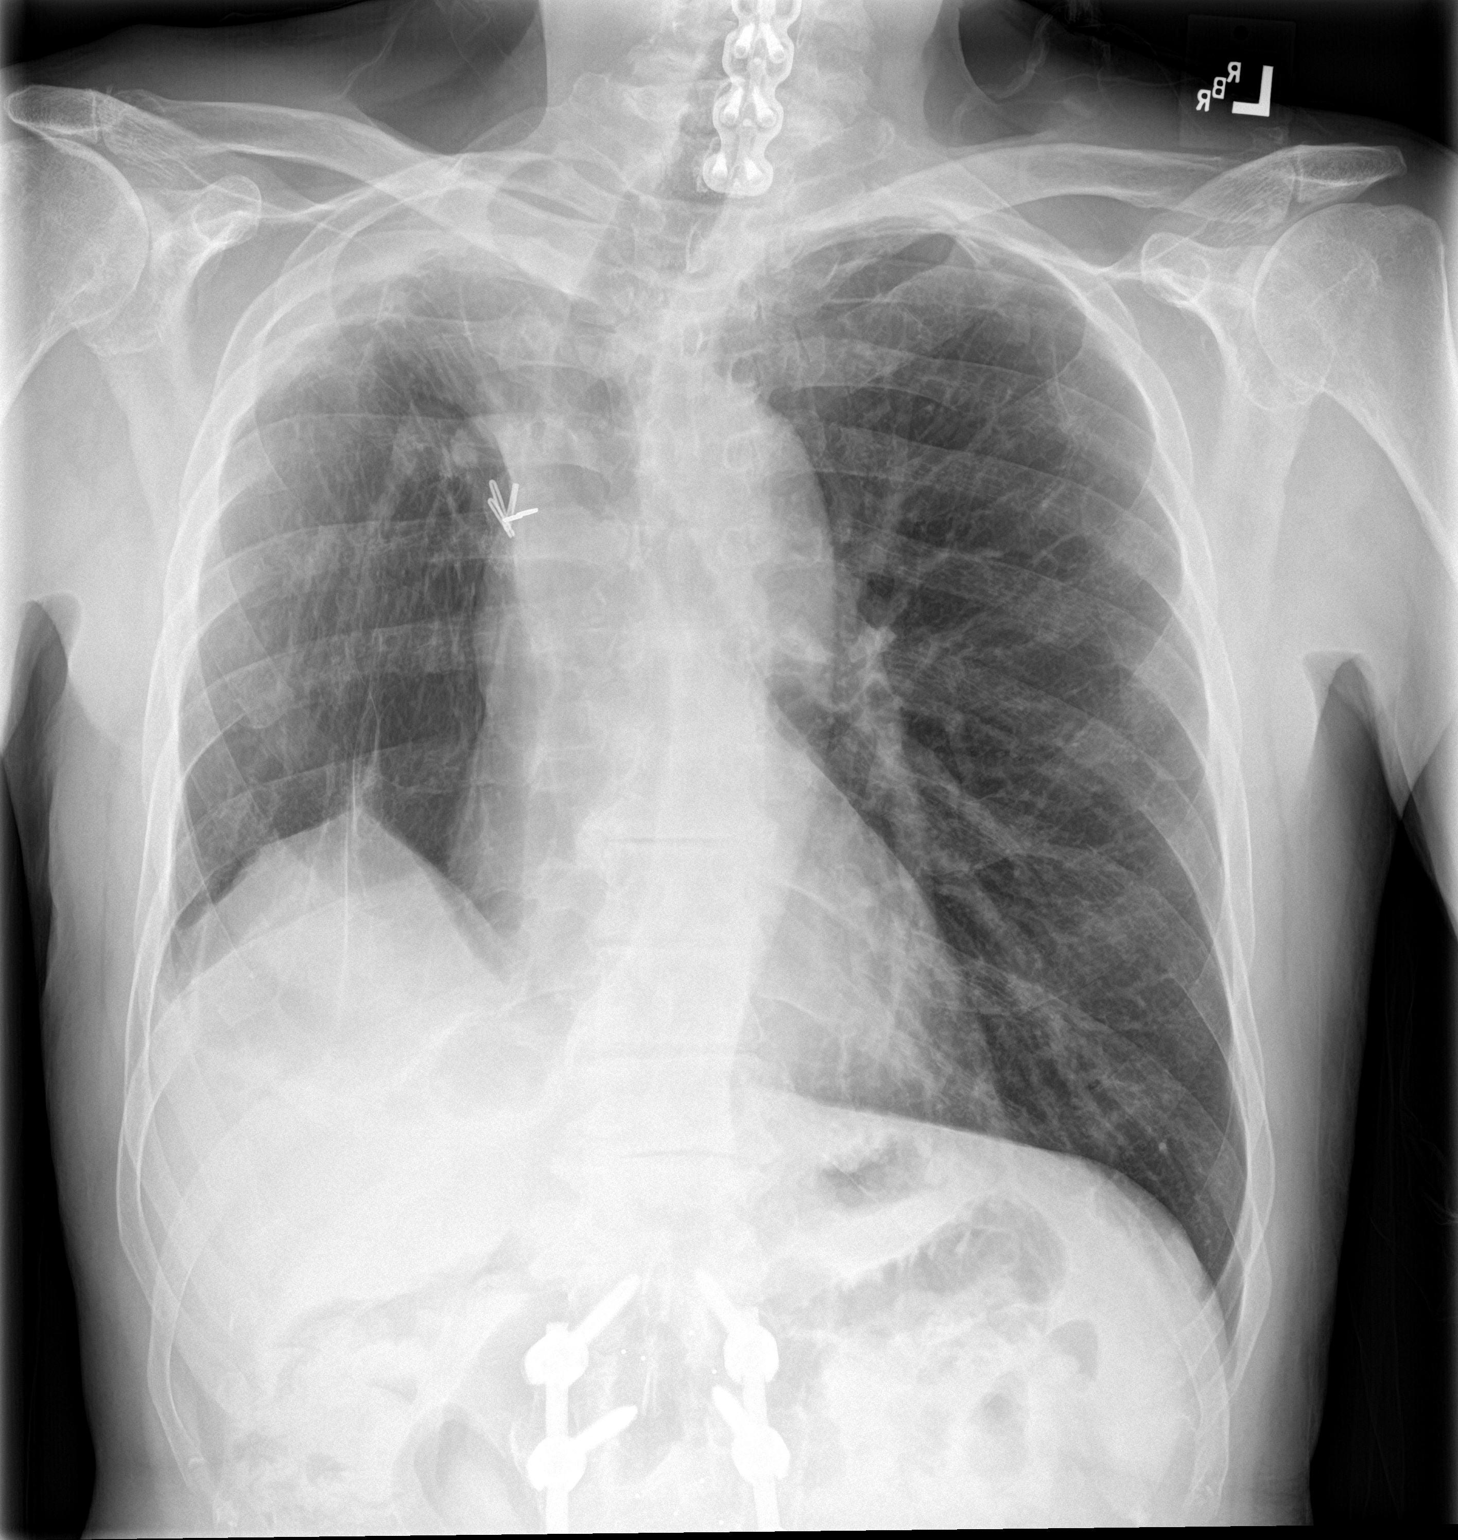

[chest lat]
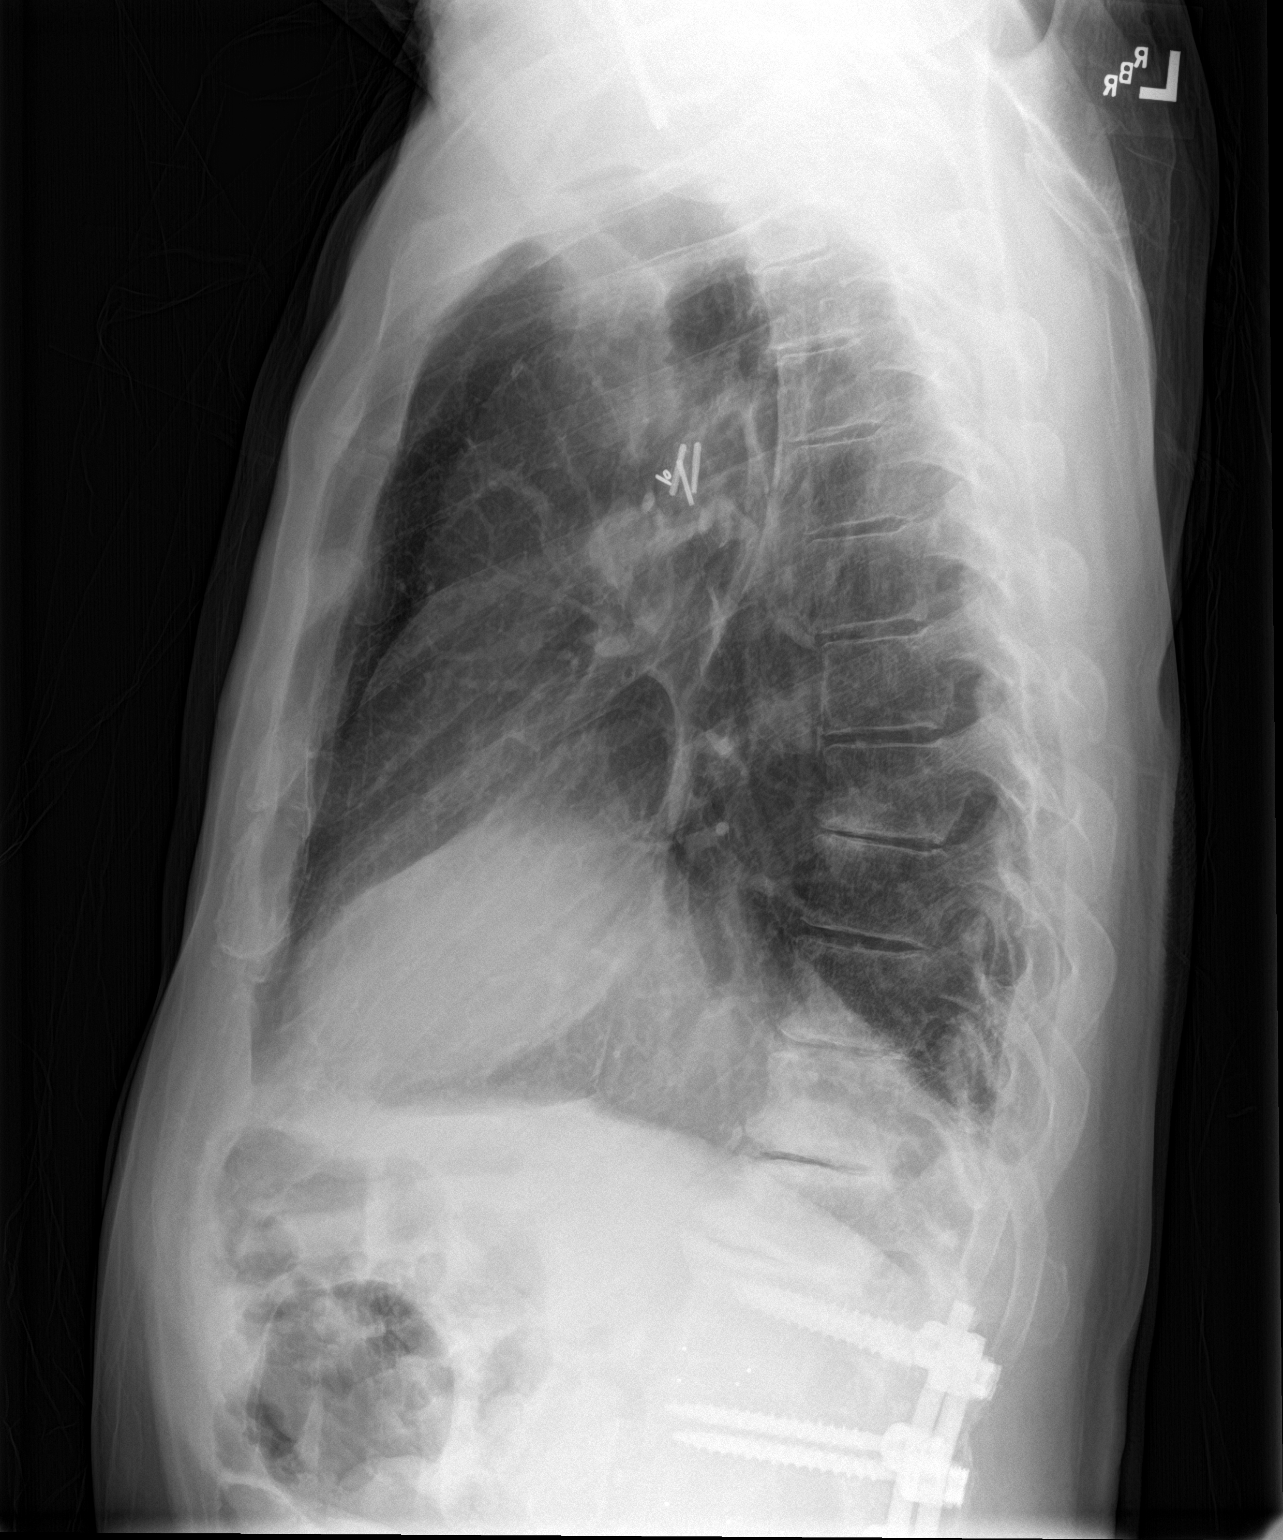

[2 of 2 positions shown; findings below may reference images not displayed]

FINDINGS: Postsurgical and post treatment changes in the right hemithorax with
associated volume loss, stable. Associated rightward shift of the
trachea and mediastinum. Lungs are otherwise clear. No pleural
fluid. Postoperative changes in the cervical and lumbar spine.
IMPRESSION: Posttreatment and postsurgical changes in the right hemithorax. No
acute findings.

## 2018-12-04 ENCOUNTER — Encounter: Payer: Self-pay | Admitting: Family Medicine

## 2018-12-13 ENCOUNTER — Encounter: Payer: Self-pay | Admitting: Family Medicine

## 2018-12-15 ENCOUNTER — Other Ambulatory Visit: Payer: Self-pay

## 2018-12-15 ENCOUNTER — Ambulatory Visit (INDEPENDENT_AMBULATORY_CARE_PROVIDER_SITE_OTHER): Payer: Medicare HMO | Admitting: Family Medicine

## 2018-12-15 DIAGNOSIS — I10 Essential (primary) hypertension: Secondary | ICD-10-CM | POA: Diagnosis not present

## 2018-12-15 DIAGNOSIS — J441 Chronic obstructive pulmonary disease with (acute) exacerbation: Secondary | ICD-10-CM

## 2018-12-15 MED ORDER — CEFDINIR 300 MG PO CAPS: 300.0000 mg | ORAL_CAPSULE | Freq: Two times a day (BID) | ORAL | 0 refills | Status: AC

## 2018-12-15 MED ORDER — METHYLPREDNISOLONE 4 MG PO TABS: ORAL_TABLET | ORAL | 0 refills | Status: DC

## 2018-12-15 MED ORDER — ALBUTEROL SULFATE HFA 108 (90 BASE) MCG/ACT IN AERS: 2.0000 | INHALATION_SPRAY | Freq: Four times a day (QID) | RESPIRATORY_TRACT | 3 refills | Status: DC | PRN

## 2018-12-15 NOTE — Assessment & Plan Note (Signed)
No concerns voiced by patient, no changes today

## 2018-12-15 NOTE — Assessment & Plan Note (Signed)
With head congestion and sinus pressure as well. Is given a refill on his Albuterol to use prn, started on Cefdinir bid and Medrol dosepak. Encouraged increased rest and hydration, add probiotics, zinc such as Coldeze or Xicam. Treat fevers as needed, continue Mucinex. Call us with any concerns.

## 2018-12-15 NOTE — Progress Notes (Signed)
Virtual Visit via Telephone Note  I connected with [name] on 12/15/18 at  2:00 PM EDT by telephone and verified that I am speaking with the correct person using two identifiers.   I discussed the limitations, risks, security and privacy concerns of performing an evaluation and management service by telephone and the availability of in person appointments. I also discussed with the patient that there may be a patient responsible charge related to this service. The patient expressed understanding and agreed to proceed. Patient was unable to get set up for video visit so we are doing a telephone   History of Present Illness: Patient is interviewed via telephone and reports cough and wheezing some tightness in the chest. He also notes head congestion and sinus pressure. He denies any fevers or chills. No chest pain or palpitations. No headaches or GI issues.     Observations/Objective: Unable to obtain due to telephone encounter   Assessment and Plan:  COPD exacerbation ([place]) With head congestion and sinus pressure as well. Is given a refill on his Albuterol to use prn, started on Cefdinir bid and Medrol dosepak. Encouraged increased rest and hydration, add probiotics, zinc such as Coldeze or Xicam. Treat fevers as needed, continue Mucinex. Call us with any concerns.   Benign essential HTN No concerns voiced by patient, no changes today   Follow up instructions: f/u 3 months or prn.   I discussed the assessment and treatment plan with the patient. The patient was provided an opportunity to ask questions and all were answered. The patient agreed with the plan and demonstrated an understanding of the instructions.   The patient was advised to call back or seek an in-person evaluation if the symptoms worsen or if the condition fails to improve as anticipated.  I provided 11 minutes of non-face-to-face time during this encounter.   Penni Homans, MD

## 2019-01-13 ENCOUNTER — Other Ambulatory Visit: Payer: Self-pay | Admitting: Pulmonary Disease

## 2019-02-17 ENCOUNTER — Encounter: Payer: Self-pay | Admitting: Family Medicine

## 2019-03-08 DIAGNOSIS — Z872 Personal history of diseases of the skin and subcutaneous tissue: Secondary | ICD-10-CM | POA: Diagnosis not present

## 2019-03-08 DIAGNOSIS — L570 Actinic keratosis: Secondary | ICD-10-CM | POA: Diagnosis not present

## 2019-03-08 DIAGNOSIS — L810 Postinflammatory hyperpigmentation: Secondary | ICD-10-CM | POA: Diagnosis not present

## 2019-03-08 DIAGNOSIS — L089 Local infection of the skin and subcutaneous tissue, unspecified: Secondary | ICD-10-CM | POA: Diagnosis not present

## 2019-03-08 DIAGNOSIS — D692 Other nonthrombocytopenic purpura: Secondary | ICD-10-CM | POA: Diagnosis not present

## 2019-03-08 DIAGNOSIS — L821 Other seborrheic keratosis: Secondary | ICD-10-CM | POA: Diagnosis not present

## 2019-03-09 ENCOUNTER — Telehealth: Payer: Self-pay | Admitting: Family Medicine

## 2019-03-09 ENCOUNTER — Encounter: Payer: Self-pay | Admitting: Family Medicine

## 2019-03-09 NOTE — Telephone Encounter (Signed)
03/12/19 Telephone appt at 8:20am

## 2019-03-09 NOTE — Telephone Encounter (Signed)
Yes let's get him in for a virtual visit this week.

## 2019-03-09 NOTE — Telephone Encounter (Signed)
Please schedule a virtual visit  I will override if need be Please advise

## 2019-03-12 ENCOUNTER — Other Ambulatory Visit: Payer: Self-pay

## 2019-03-12 ENCOUNTER — Ambulatory Visit (INDEPENDENT_AMBULATORY_CARE_PROVIDER_SITE_OTHER): Payer: Medicare HMO | Admitting: Family Medicine

## 2019-03-12 DIAGNOSIS — E785 Hyperlipidemia, unspecified: Secondary | ICD-10-CM

## 2019-03-12 DIAGNOSIS — L989 Disorder of the skin and subcutaneous tissue, unspecified: Secondary | ICD-10-CM

## 2019-03-12 DIAGNOSIS — R739 Hyperglycemia, unspecified: Secondary | ICD-10-CM

## 2019-03-12 DIAGNOSIS — I10 Essential (primary) hypertension: Secondary | ICD-10-CM

## 2019-03-12 DIAGNOSIS — J418 Mixed simple and mucopurulent chronic bronchitis: Secondary | ICD-10-CM | POA: Diagnosis not present

## 2019-03-12 DIAGNOSIS — W19XXXA Unspecified fall, initial encounter: Secondary | ICD-10-CM

## 2019-03-12 MED ORDER — CEFDINIR 300 MG PO CAPS: 300.0000 mg | ORAL_CAPSULE | Freq: Two times a day (BID) | ORAL | 0 refills | Status: AC

## 2019-03-12 NOTE — Progress Notes (Signed)
Virtual Visit via phone Note  I connected with [name] on 03/12/19 at  8:20 AM EDT by a phone enabled telemedicine application and verified that I am speaking with the correct person using two identifiers.  Location: Patient: home Provider: home   I discussed the limitations of evaluation and management by telemedicine and the availability of in person appointments. The patient expressed understanding and agreed to proceed. Princess Eulas Post CMA was not able to get him set up on a video visit so we completed the visit via the phone.     Subjective:    Patient ID: GLENDA KUNST, male    DOB: [birth year], 73 y.o.   MRN: [id number]  No chief complaint on file.   HPI Patient is in today for follow up on chronic medical concerns including hypertension, hyperglycemia, COPD and hyperlipidemia. No recent febrile illness but he is noting some increased head and chest congestion this week. Some cough which is productive of clear phlegm, some wheezing and malaise are also noted. Denies CP/palp/fevers/GI or GU c/o. Taking meds as prescribed. He is maintaining social distancing well.  Past Medical History:  Diagnosis Date   Allergic rhinitis    Atypical chest pain 12/01/2014   Back pain 01/12/2013   COPD (chronic obstructive pulmonary disease) (HCC)    FeV1 64%-2007   Emphysema    GERD (gastroesophageal reflux disease)    History of lung abscess    bronchiectasis with RMLandRLL ersection -1996- Dr Arlyce Dice   Hordeolum externum (stye) 04/23/2015   Right eye   Hypertension    Impaired vision    glasses   Osteoarthritis    hands and knees   Sinusitis, acute 04/23/2015    Past Surgical History:  Procedure Laterality Date   HERNIA REPAIR  10/2009   left knee     lower back surgery  09/2008   06/2009   LUNG SURGERY     RML andRUL removed due to bleeding and bronchiectasis and lung abcess   NECK SURGERY  05/2008   right foot surgery      Family History  Problem  Relation Age of Onset   Prostate cancer Father        father died prostate ca   Coronary artery disease Other        1st degree relative<60   Stroke Other        1st degree relative<50   Heart failure Mother        age 56   Colon cancer Paternal Grandmother    Obstructive Sleep Apnea Brother    Obesity Brother     Social History   Socioeconomic History   Marital status: Married    Spouse name: Not on file   Number of children: Not on file   Years of education: Not on file   Highest education level: Not on file  Occupational History   Occupation: retired Music therapist  Social Needs   Financial resource strain: Not on file   Food insecurity    Worry: Not on file    Inability: Not on file   Transportation needs    Medical: Not on file    Non-medical: Not on file  Tobacco Use   Smoking status: Former Smoker    Packs/day: 1.50    Years: 38.00    Pack years: 57.00    Types: Cigarettes    Quit date: 09/03/2000    Years since quitting: 18.5   Smokeless tobacco: Never Used  Substance and  Sexual Activity   Alcohol use: Yes    Alcohol/week: 2.0 - 3.0 standard drinks    Types: 2 - 3 Cans of beer per week   Drug use: No   Sexual activity: Never  Lifestyle   Physical activity    Days per week: Not on file    Minutes per session: Not on file   Stress: Not on file  Relationships   Social connections    Talks on phone: Not on file    Gets together: Not on file    Attends religious service: Not on file    Active member of club or organization: Not on file    Attends meetings of clubs or organizations: Not on file    Relationship status: Not on file   Intimate partner violence    Fear of current or ex partner: Not on file    Emotionally abused: Not on file    Physically abused: Not on file    Forced sexual activity: Not on file  Other Topics Concern   Not on file  Social History Narrative   Retired Music therapist   Patient states  former smoker. 1 1/2 ppd x 38 yrs  Quit in Jan 2002   Married - 2 weeks (4th marriage)   divorced,  remarried 84-2004 (lost wife to lung ca),  remarried (divorced),    1 son  - 78 [name])   Alcohol use-yes (2-3 beers per week)            Outpatient Medications Prior to Visit  Medication Sig Dispense Refill   albuterol (PROVENTIL HFA;VENTOLIN HFA) 108 (90 Base) MCG/ACT inhaler Inhale 2 puffs into the lungs every 6 (six) hours as needed for wheezing or shortness of breath. 3 Inhaler 3   amLODipine (NORVASC) 5 MG tablet Take 1 tablet (5 mg total) by mouth daily. 90 tablet 2   aspirin-acetaminophen-caffeine (EXCEDRIN MIGRAINE) 188-416-60 MG per tablet Take 2 tablets by mouth every 6 (six) hours as needed (arthritis pain).      calcium elemental as carbonate (TUMS ULTRA 1000) 400 MG chewable tablet Chew 1,000 mg by mouth 2 (two) times daily.     Capsaicin-Menthol (SALONPAS GEL EX) Apply 1 application topically daily as needed (arthritis pain).     fluticasone (FLONASE) 50 MCG/ACT nasal spray Place 2 sprays into both nostrils daily as needed for allergies or rhinitis. 48 g 3   Glucosamine-Chondroitin 750-600 MG TABS Take 1 tablet by mouth 2 (two) times daily.     Guaifenesin (MUCINEX MAXIMUM STRENGTH) 1200 MG TB12 Take 600 mg by mouth 2 (two) times daily.     imiquimod (ALDARA) 5 % cream Apply topically 3 (three) times a week. 12 each 2   loratadine (CLARITIN) 10 MG tablet Take 10 mg by mouth daily.     meloxicam (MOBIC) 15 MG tablet Take 1 tablet (15 mg total) by mouth daily as needed for pain. 90 tablet 0   Menthol (ROBITUSSIN COUGH DROPS MT) Use as directed 1 lozenge in the mouth or throat 4 (four) times daily as needed (cough).      Menthol, Topical Analgesic, (BLUE-EMU MAXIMUM STRENGTH EX) Apply 1 application topically daily as needed (arthritis pain).     methylPREDNISolone (MEDROL) 4 MG tablet 5 tab po qd X 1d then 4 tab po qd X 1d then 3 tab po qd X 1d then 2 tab po qd then  1 tab po qd 15 tablet 0   montelukast (SINGULAIR) 10 MG tablet Take  1 tablet (10 mg total) by mouth daily. 90 tablet 3   Multiple Vitamin (MULTIVITAMIN WITH MINERALS) TABS tablet Take 1 tablet by mouth on Monday, Wednesday, Friday with supper      Neomy-Bacit-Polymyx-Pramoxine (TRIPLE ANTIBIOTIC PAIN RELIEF EX) Apply 1 application topically daily as needed (wound care).     TRELEGY ELLIPTA 100-62.5-25 MCG/INH AEPB INHALE 1 PUFF INTO THE LUNGS DAILY. 180 each 0   No facility-administered medications prior to visit.     No Known Allergies  Review of Systems  Constitutional: Negative for fever and malaise/fatigue.  HENT: Positive for congestion.   Eyes: Negative for blurred vision.  Respiratory: Positive for cough, sputum production and wheezing. Negative for shortness of breath.   Cardiovascular: Negative for chest pain, palpitations and leg swelling.  Gastrointestinal: Negative for abdominal pain, blood in stool and nausea.  Genitourinary: Negative for dysuria and frequency.  Musculoskeletal: Negative for falls.  Skin: Negative for rash.  Neurological: Positive for headaches. Negative for dizziness and loss of consciousness.  Endo/Heme/Allergies: Negative for environmental allergies.  Psychiatric/Behavioral: Negative for depression. The patient is not nervous/anxious.        Objective:    Physical Exam unable to obtain via phone  There were no vitals taken for this visit. Wt Readings from Last 3 Encounters:  09/28/18 152 lb 9.6 oz (69.2 kg)  09/08/18 158 lb 4.8 oz (71.8 kg)  09/07/18 152 lb 9.6 oz (69.2 kg)    Diabetic Foot Exam - Simple   No data filed     Lab Results  Component Value Date   WBC 6.1 09/28/2018   HGB 14.3 09/28/2018   HCT 42.8 09/28/2018   PLT 244.0 09/28/2018   GLUCOSE 88 09/28/2018   CHOL 181 09/10/2016   TRIG 144.0 09/10/2016   HDL 61.30 09/10/2016   LDLDIRECT 85 07/22/2014   LDLCALC 91 09/10/2016   ALT 16 09/28/2018   AST 21 09/28/2018    NA 141 09/28/2018   K 4.6 09/28/2018   CL 105 09/28/2018   CREATININE 0.87 09/28/2018   BUN 27 (H) 09/28/2018   CO2 29 09/28/2018   TSH 0.88 09/28/2018   PSA 2.06 09/28/2018   INR 1.18 09/28/2016   HGBA1C 5.6 09/28/2018    Lab Results  Component Value Date   TSH 0.88 09/28/2018   Lab Results  Component Value Date   WBC 6.1 09/28/2018   HGB 14.3 09/28/2018   HCT 42.8 09/28/2018   MCV 93.5 09/28/2018   PLT 244.0 09/28/2018   Lab Results  Component Value Date   NA 141 09/28/2018   K 4.6 09/28/2018   CO2 29 09/28/2018   GLUCOSE 88 09/28/2018   BUN 27 (H) 09/28/2018   CREATININE 0.87 09/28/2018   BILITOT 0.6 09/28/2018   ALKPHOS 75 09/28/2018   AST 21 09/28/2018   ALT 16 09/28/2018   PROT 6.7 09/28/2018   ALBUMIN 4.0 09/28/2018   CALCIUM 9.5 09/28/2018   ANIONGAP 6 09/30/2016   GFR 86.28 09/28/2018   Lab Results  Component Value Date   CHOL 181 09/10/2016   Lab Results  Component Value Date   HDL 61.30 09/10/2016   Lab Results  Component Value Date   LDLCALC 91 09/10/2016   Lab Results  Component Value Date   TRIG 144.0 09/10/2016   Lab Results  Component Value Date   CHOLHDL 3 09/10/2016   Lab Results  Component Value Date   HGBA1C 5.6 09/28/2018       Assessment & Plan:  Problem List Items Addressed This Visit    Benign essential HTN    Encouraged to check vitals weekly and report any concerns. no changes to meds. Encouraged heart healthy diet such as the DASH diet and exercise as tolerated.       Relevant Orders   CBC   Comprehensive metabolic panel   TSH   COPD GOLD II D    No recent exacerbation that is severe but over the past week he has noted increased congestion, productive cough and some wheezing off and on and he is maintaining social distancing well. Encouraged to continue. Will treat with abx and mucinex and reevaluate as needed.       Hyperglycemia    minimize simple carbs. Increase exercise as tolerated. Continue current  meds      Relevant Orders   Hemoglobin A1c   Skin lesion - Primary    Was seen by dermatology on Monday and he is worried that he is gonna have trouble with his insurance covering the visit. He had a referral placed in January but the March visit got canceled due to [place]. He was offered reassurance that the referral should still be good but did place a new referral to reassure patient.       Relevant Orders   Ambulatory referral to Dermatology    Other Visit Diagnoses    Hyperlipidemia, unspecified hyperlipidemia type       Relevant Orders   Lipid panel   Fall, initial encounter       Relevant Orders   DG Shoulder Right   DG Knee Complete 4 Views Right      I am having Jung S. Heyde "Al" start on cefdinir. I am also having him maintain his aspirin-acetaminophen-caffeine, loratadine, Menthol (ROBITUSSIN COUGH DROPS MT), Guaifenesin, Glucosamine-Chondroitin, calcium elemental as carbonate, multivitamin with minerals, Capsaicin-Menthol (SALONPAS GEL EX), (Menthol, Topical Analgesic, (BLUE-EMU MAXIMUM STRENGTH EX)), Neomy-Bacit-Polymyx-Pramoxine (TRIPLE ANTIBIOTIC PAIN RELIEF EX), amLODipine, montelukast, fluticasone, meloxicam, imiquimod, albuterol, methylPREDNISolone, and Trelegy Ellipta.  Meds ordered this encounter  Medications   cefdinir (OMNICEF) 300 MG capsule    Sig: Take 1 capsule (300 mg total) by mouth 2 (two) times daily for 10 days.    Dispense:  20 capsule    Refill:  0     I discussed the assessment and treatment plan with the patient. The patient was provided an opportunity to ask questions and all were answered. The patient agreed with the plan and demonstrated an understanding of the instructions.   The patient was advised to call back or seek an in-person evaluation if the symptoms worsen or if the condition fails to improve as anticipated.  I provided 25 minutes of non-face-to-face time during this encounter.   Penni Homans, MD

## 2019-03-12 NOTE — Assessment & Plan Note (Signed)
Was seen by dermatology on Monday and he is worried that he is gonna have trouble with his insurance covering the visit. He had a referral placed in January but the March visit got canceled due to [place]. He was offered reassurance that the referral should still be good but did place a new referral to reassure patient.

## 2019-03-12 NOTE — Assessment & Plan Note (Signed)
Encouraged to check vitals weekly and report any concerns.  no changes to meds. Encouraged heart healthy diet such as the DASH diet and exercise as tolerated.  

## 2019-03-12 NOTE — Assessment & Plan Note (Signed)
minimize simple carbs. Increase exercise as tolerated. Continue current meds  

## 2019-03-12 NOTE — Assessment & Plan Note (Addendum)
No recent exacerbation that is severe but over the past week he has noted increased congestion, productive cough and some wheezing off and on and he is maintaining social distancing well. Encouraged to continue. Will treat with abx and mucinex and reevaluate as needed.

## 2019-03-15 ENCOUNTER — Ambulatory Visit: Payer: Medicare HMO | Admitting: Family Medicine

## 2019-03-15 ENCOUNTER — Telehealth: Payer: Self-pay | Admitting: *Deleted

## 2019-03-15 NOTE — Telephone Encounter (Signed)
Copied from [place] #[id number]. Topic: Referral - Status >> Mar 15, 2019  3:59 PM Oneta Rack wrote: [facility] wanted to make PCP aware referral will be "shredded"  due to patient expressing he has been seen at another dermatology office

## 2019-03-17 NOTE — Telephone Encounter (Signed)
Pt called and stated that he received a second call from [facility] to schedule but Pt has already been seen at [facility]/ please advise

## 2019-04-14 ENCOUNTER — Other Ambulatory Visit: Payer: Self-pay

## 2019-04-14 ENCOUNTER — Ambulatory Visit (INDEPENDENT_AMBULATORY_CARE_PROVIDER_SITE_OTHER): Payer: Medicare HMO | Admitting: Family

## 2019-04-14 ENCOUNTER — Encounter: Payer: Self-pay | Admitting: Family Medicine

## 2019-04-14 VITALS — Temp 97.9°F

## 2019-04-14 DIAGNOSIS — J0100 Acute maxillary sinusitis, unspecified: Secondary | ICD-10-CM | POA: Diagnosis not present

## 2019-04-14 DIAGNOSIS — J40 Bronchitis, not specified as acute or chronic: Secondary | ICD-10-CM

## 2019-04-14 MED ORDER — DOXYCYCLINE HYCLATE 100 MG PO TABS: 100.0000 mg | ORAL_TABLET | Freq: Two times a day (BID) | ORAL | 0 refills | Status: DC

## 2019-04-14 NOTE — Progress Notes (Signed)
Virtual Visit via Video Note  I connected with [name] on 04/14/19 at 11:00 AM EDT by a video enabled telemedicine application and verified that I am speaking with the correct person using two identifiers.  Location: Patient: home Provider: office   I discussed the limitations of evaluation and management by telemedicine and the availability of in person appointments. The patient expressed understanding and agreed to proceed.  History of Present Illness:  Patient is a 73 yr old male who presents today with two concerns:    He reports + sinus congestion- for months.  Notes that he has had increased pain/pressure in the left cheek the last few days. He denies associated fever.  Using mucinex without significant improvement. He is also concerned about worsening chest congestion. Cough is slightly worse than normal.  Cough is productive.  Phlegm is mildly yellow. Denies sob.  + nasal drainage.  Of note, pt has hx of COPD and bronchiectasis/lung abscess with RML and RLL rersection back in 1996.   He denies any known covid-19 exposure. Reports that he has mostly been staying at home and has masked when he does leave the home.    Past Medical History:  Diagnosis Date  . Allergic rhinitis   . Atypical chest pain 12/01/2014  . Back pain 01/12/2013  . COPD (chronic obstructive pulmonary disease) (HCC)    FeV1 64%-2007  . Emphysema   . GERD (gastroesophageal reflux disease)   . History of lung abscess    bronchiectasis with RMLandRLL ersection -1996- Dr Arlyce Dice  . Hordeolum externum (stye) 04/23/2015   Right eye  . Hypertension   . Impaired vision    glasses  . Osteoarthritis    hands and knees  . Sinusitis, acute 04/23/2015     Social History   Socioeconomic History  . Marital status: Married    Spouse name: Not on file  . Number of children: Not on file  . Years of education: Not on file  . Highest education level: Not on file  Occupational History  . Occupation: retired  Music therapist  Social Needs  . Financial resource strain: Not on file  . Food insecurity    Worry: Not on file    Inability: Not on file  . Transportation needs    Medical: Not on file    Non-medical: Not on file  Tobacco Use  . Smoking status: Former Smoker    Packs/day: 1.50    Years: 38.00    Pack years: 57.00    Types: Cigarettes    Quit date: 09/03/2000    Years since quitting: 18.6  . Smokeless tobacco: Never Used  Substance and Sexual Activity  . Alcohol use: Yes    Alcohol/week: 2.0 - 3.0 standard drinks    Types: 2 - 3 Cans of beer per week  . Drug use: No  . Sexual activity: Never  Lifestyle  . Physical activity    Days per week: Not on file    Minutes per session: Not on file  . Stress: Not on file  Relationships  . Social Herbalist on phone: Not on file    Gets together: Not on file    Attends religious service: Not on file    Active member of club or organization: Not on file    Attends meetings of clubs or organizations: Not on file    Relationship status: Not on file  . Intimate partner violence    Fear of current  or ex partner: Not on file    Emotionally abused: Not on file    Physically abused: Not on file    Forced sexual activity: Not on file  Other Topics Concern  . Not on file  Social History Narrative   Retired Music therapist   Patient states former smoker. 1 1/2 ppd x 38 yrs  Quit in Jan 2002   Married - 2 weeks (4th marriage)   divorced,  remarried 84-2004 (lost wife to lung ca),  remarried (divorced),    1 son  - 65 [name])   Alcohol use-yes (2-3 beers per week)            Past Surgical History:  Procedure Laterality Date  . HERNIA REPAIR  10/2009  . left knee    . lower back surgery  09/2008   06/2009  . LUNG SURGERY     RML andRUL removed due to bleeding and bronchiectasis and lung abcess  . NECK SURGERY  05/2008  . right foot surgery      Family History  Problem Relation Age of Onset  . Prostate  cancer Father        father died prostate ca  . Coronary artery disease Other        1st degree relative<60  . Stroke Other        1st degree relative<50  . Heart failure Mother        age 65  . Colon cancer Paternal Grandmother   . Obstructive Sleep Apnea Brother   . Obesity Brother     No Known Allergies  Current Outpatient Medications on File Prior to Visit  Medication Sig Dispense Refill  . albuterol (PROVENTIL HFA;VENTOLIN HFA) 108 (90 Base) MCG/ACT inhaler Inhale 2 puffs into the lungs every 6 (six) hours as needed for wheezing or shortness of breath. 3 Inhaler 3  . amLODipine (NORVASC) 5 MG tablet Take 1 tablet (5 mg total) by mouth daily. 90 tablet 2  . aspirin-acetaminophen-caffeine (EXCEDRIN MIGRAINE) 338-250-53 MG per tablet Take 2 tablets by mouth every 6 (six) hours as needed (arthritis pain).     . calcium elemental as carbonate (TUMS ULTRA 1000) 400 MG chewable tablet Chew 1,000 mg by mouth 2 (two) times daily.    . Capsaicin-Menthol (SALONPAS GEL EX) Apply 1 application topically daily as needed (arthritis pain).    . fluticasone (FLONASE) 50 MCG/ACT nasal spray Place 2 sprays into both nostrils daily as needed for allergies or rhinitis. 48 g 3  . Glucosamine-Chondroitin 750-600 MG TABS Take 1 tablet by mouth 2 (two) times daily.    . Guaifenesin (MUCINEX MAXIMUM STRENGTH) 1200 MG TB12 Take 600 mg by mouth 2 (two) times daily.    . imiquimod (ALDARA) 5 % cream Apply topically 3 (three) times a week. 12 each 2  . loratadine (CLARITIN) 10 MG tablet Take 10 mg by mouth daily.    . meloxicam (MOBIC) 15 MG tablet Take 1 tablet (15 mg total) by mouth daily as needed for pain. 90 tablet 0  . Menthol (ROBITUSSIN COUGH DROPS MT) Use as directed 1 lozenge in the mouth or throat 4 (four) times daily as needed (cough).     . Menthol, Topical Analgesic, (BLUE-EMU MAXIMUM STRENGTH EX) Apply 1 application topically daily as needed (arthritis pain).    . montelukast (SINGULAIR) 10 MG  tablet Take 1 tablet (10 mg total) by mouth daily. 90 tablet 3  . Multiple Vitamin (MULTIVITAMIN WITH MINERALS) TABS tablet Take 1  tablet by mouth on Monday, Wednesday, Friday with supper     . Neomy-Bacit-Polymyx-Pramoxine (TRIPLE ANTIBIOTIC PAIN RELIEF EX) Apply 1 application topically daily as needed (wound care).    . TRELEGY ELLIPTA 100-62.5-25 MCG/INH AEPB INHALE 1 PUFF INTO THE LUNGS DAILY. 180 each 0   No current facility-administered medications on file prior to visit.     Temp 97.9 F (36.6 C) (Oral)     Observations/Objective:   Gen: Awake, alert, no acute distress Resp: Breathing is even and non-labored Psych: calm/pleasant demeanor Neuro: Alert and Oriented x 3, + facial symmetry, speech is clear.   Assessment and Plan:  Sinusitis- will rx with doxycycline. Continue mucinex.  Bronchitis- will rx with doxycycline.  Pt is advised to call if new/worsening symptoms or if symptoms fail to improve in the next 2-3 days. Pt verbalizes understanding.   Follow Up Instructions:    I discussed the assessment and treatment plan with the patient. The patient was provided an opportunity to ask questions and all were answered. The patient agreed with the plan and demonstrated an understanding of the instructions.   The patient was advised to call back or seek an in-person evaluation if the symptoms worsen or if the condition fails to improve as anticipated.  Nance Pear, NP

## 2019-04-17 ENCOUNTER — Other Ambulatory Visit: Payer: Self-pay | Admitting: Pulmonary Disease

## 2019-04-20 ENCOUNTER — Telehealth: Payer: Self-pay | Admitting: Primary Care

## 2019-04-20 MED ORDER — TRELEGY ELLIPTA 100-62.5-25 MCG/INH IN AEPB: 1.0000 | INHALATION_SPRAY | Freq: Every day | RESPIRATORY_TRACT | 3 refills | Status: DC

## 2019-04-20 NOTE — Telephone Encounter (Signed)
Call returned to patient, he needs a refill of his Trelegy sent to [facility]. He states he called the pharmacy and they told him our office refused the refill because he has not been seen within a year. Patient was last seen 09/07/2018. Refill sent in. Voiced understanding. Nothing further needed at this time.

## 2019-05-21 ENCOUNTER — Encounter: Payer: Self-pay | Admitting: Family Medicine

## 2019-05-24 ENCOUNTER — Encounter: Payer: Self-pay | Admitting: *Deleted

## 2019-05-24 ENCOUNTER — Other Ambulatory Visit: Payer: Self-pay | Admitting: Family Medicine

## 2019-05-24 DIAGNOSIS — M25512 Pain in left shoulder: Secondary | ICD-10-CM

## 2019-05-24 DIAGNOSIS — G8929 Other chronic pain: Secondary | ICD-10-CM

## 2019-05-24 NOTE — Telephone Encounter (Signed)
Flu shot updated. 

## 2019-06-02 ENCOUNTER — Ambulatory Visit (HOSPITAL_BASED_OUTPATIENT_CLINIC_OR_DEPARTMENT_OTHER)
Admission: RE | Admit: 2019-06-02 | Discharge: 2019-06-02 | Disposition: A | Discharge status: Home / Self Care | Payer: Medicare HMO | Source: Ambulatory Visit | Attending: Family Medicine | Admitting: Family Medicine

## 2019-06-02 ENCOUNTER — Other Ambulatory Visit (INDEPENDENT_AMBULATORY_CARE_PROVIDER_SITE_OTHER): Payer: Medicare HMO

## 2019-06-02 ENCOUNTER — Other Ambulatory Visit: Payer: Medicare HMO

## 2019-06-02 ENCOUNTER — Other Ambulatory Visit (HOSPITAL_BASED_OUTPATIENT_CLINIC_OR_DEPARTMENT_OTHER): Payer: Medicare HMO

## 2019-06-02 ENCOUNTER — Other Ambulatory Visit: Payer: Self-pay

## 2019-06-02 ENCOUNTER — Ambulatory Visit: Payer: Medicare HMO

## 2019-06-02 DIAGNOSIS — S4992XA Unspecified injury of left shoulder and upper arm, initial encounter: Secondary | ICD-10-CM | POA: Diagnosis not present

## 2019-06-02 DIAGNOSIS — M25511 Pain in right shoulder: Secondary | ICD-10-CM | POA: Insufficient documentation

## 2019-06-02 DIAGNOSIS — R739 Hyperglycemia, unspecified: Secondary | ICD-10-CM

## 2019-06-02 DIAGNOSIS — G8929 Other chronic pain: Secondary | ICD-10-CM | POA: Diagnosis not present

## 2019-06-02 DIAGNOSIS — E785 Hyperlipidemia, unspecified: Secondary | ICD-10-CM

## 2019-06-02 DIAGNOSIS — M25512 Pain in left shoulder: Secondary | ICD-10-CM | POA: Insufficient documentation

## 2019-06-02 DIAGNOSIS — M19011 Primary osteoarthritis, right shoulder: Secondary | ICD-10-CM | POA: Insufficient documentation

## 2019-06-02 DIAGNOSIS — M25461 Effusion, right knee: Secondary | ICD-10-CM | POA: Insufficient documentation

## 2019-06-02 DIAGNOSIS — M25561 Pain in right knee: Secondary | ICD-10-CM | POA: Insufficient documentation

## 2019-06-02 DIAGNOSIS — W19XXXA Unspecified fall, initial encounter: Secondary | ICD-10-CM | POA: Diagnosis not present

## 2019-06-02 DIAGNOSIS — M25562 Pain in left knee: Secondary | ICD-10-CM | POA: Diagnosis not present

## 2019-06-02 DIAGNOSIS — M1712 Unilateral primary osteoarthritis, left knee: Secondary | ICD-10-CM | POA: Diagnosis not present

## 2019-06-02 DIAGNOSIS — I10 Essential (primary) hypertension: Secondary | ICD-10-CM | POA: Diagnosis not present

## 2019-06-02 DIAGNOSIS — S4991XA Unspecified injury of right shoulder and upper arm, initial encounter: Secondary | ICD-10-CM | POA: Diagnosis not present

## 2019-06-02 LAB — COMPREHENSIVE METABOLIC PANEL
ALT: 16 U/L (ref 0–53)
AST: 23 U/L (ref 0–37)
Albumin: 4 g/dL (ref 3.5–5.2)
Alkaline Phosphatase: 88 U/L (ref 39–117)
BUN: 19 mg/dL (ref 6–23)
CO2: 26 mEq/L (ref 19–32)
Calcium: 9.4 mg/dL (ref 8.4–10.5)
Chloride: 104 mEq/L (ref 96–112)
Creatinine, Ser: 0.81 mg/dL (ref 0.40–1.50)
GFR: 93.52 mL/min (ref >60.00)
Glucose, Bld: 99 mg/dL (ref 70–99)
Potassium: 4.2 mEq/L (ref 3.5–5.1)
Sodium: 140 mEq/L (ref 135–145)
Total Bilirubin: 0.7 mg/dL (ref 0.2–1.2)
Total Protein: 6.9 g/dL (ref 6.0–8.3)

## 2019-06-02 LAB — CBC
HCT: 42.7 % (ref 39.0–52.0)
Hemoglobin: 14.1 g/dL (ref 13.0–17.0)
MCHC: 33.1 g/dL (ref 30.0–36.0)
MCV: 93.3 fl (ref 78.0–100.0)
Platelets: 271 10*3/uL (ref 150.0–400.0)
RBC: 4.58 Mil/uL (ref 4.22–5.81)
RDW: 12.8 % (ref 11.5–15.5)
WBC: 6.3 10*3/uL (ref 4.0–10.5)

## 2019-06-02 LAB — LIPID PANEL
Cholesterol: 146 mg/dL (ref 0–200)
HDL: 46.1 mg/dL (ref >39.00)
LDL Cholesterol: 66 mg/dL (ref 0–99)
NonHDL: 99.57
Total CHOL/HDL Ratio: 3
Triglycerides: 168 mg/dL — ABNORMAL HIGH (ref 0.0–149.0)
VLDL: 33.6 mg/dL (ref 0.0–40.0)

## 2019-06-02 LAB — HEMOGLOBIN A1C: Hgb A1c MFr Bld: 5.9 % (ref 4.6–6.5)

## 2019-06-02 LAB — TSH: TSH: 1.06 u[IU]/mL (ref 0.35–4.50)

## 2019-06-10 ENCOUNTER — Ambulatory Visit (INDEPENDENT_AMBULATORY_CARE_PROVIDER_SITE_OTHER): Payer: Medicare HMO | Admitting: Family Medicine

## 2019-06-10 ENCOUNTER — Other Ambulatory Visit: Payer: Self-pay

## 2019-06-10 DIAGNOSIS — J301 Allergic rhinitis due to pollen: Secondary | ICD-10-CM

## 2019-06-10 DIAGNOSIS — R739 Hyperglycemia, unspecified: Secondary | ICD-10-CM | POA: Diagnosis not present

## 2019-06-10 DIAGNOSIS — J418 Mixed simple and mucopurulent chronic bronchitis: Secondary | ICD-10-CM | POA: Diagnosis not present

## 2019-06-10 DIAGNOSIS — I10 Essential (primary) hypertension: Secondary | ICD-10-CM | POA: Diagnosis not present

## 2019-06-10 DIAGNOSIS — J0101 Acute recurrent maxillary sinusitis: Secondary | ICD-10-CM | POA: Diagnosis not present

## 2019-06-10 MED ORDER — DOXYCYCLINE HYCLATE 100 MG PO TABS: 100.0000 mg | ORAL_TABLET | Freq: Two times a day (BID) | ORAL | 0 refills | Status: DC

## 2019-06-10 NOTE — Patient Instructions (Signed)
Sinusitis, Adult Sinusitis is inflammation of your sinuses. Sinuses are hollow spaces in the bones around your face. Your sinuses are located:  Around your eyes.  In the middle of your forehead.  Behind your nose.  In your cheekbones. Mucus normally drains out of your sinuses. When your nasal tissues become inflamed or swollen, mucus can become trapped or blocked. This allows bacteria, viruses, and fungi to grow, which leads to infection. Most infections of the sinuses are caused by a virus. Sinusitis can develop quickly. It can last for up to 4 weeks (acute) or for more than 12 weeks (chronic). Sinusitis often develops after a cold. What are the causes? This condition is caused by anything that creates swelling in the sinuses or stops mucus from draining. This includes:  Allergies.  Asthma.  Infection from bacteria or viruses.  Deformities or blockages in your nose or sinuses.  Abnormal growths in the nose (nasal polyps).  Pollutants, such as chemicals or irritants in the air.  Infection from fungi (rare). What increases the risk? You are more likely to develop this condition if you:  Have a weak body defense system (immune system).  Do a lot of swimming or diving.  Overuse nasal sprays.  Smoke. What are the signs or symptoms? The main symptoms of this condition are pain and a feeling of pressure around the affected sinuses. Other symptoms include:  Stuffy nose or congestion.  Thick drainage from your nose.  Swelling and warmth over the affected sinuses.  Headache.  Upper toothache.  A cough that may get worse at night.  Extra mucus that collects in the throat or the back of the nose (postnasal drip).  Decreased sense of smell and taste.  Fatigue.  A fever.  Sore throat.  Bad breath. How is this diagnosed? This condition is diagnosed based on:  Your symptoms.  Your medical history.  A physical exam.  Tests to find out if your condition is  acute or chronic. This may include: ? Checking your nose for nasal polyps. ? Viewing your sinuses using a device that has a light (endoscope). ? Testing for allergies or bacteria. ? Imaging tests, such as an MRI or CT scan. In rare cases, a bone biopsy may be done to rule out more serious types of fungal sinus disease. How is this treated? Treatment for sinusitis depends on the cause and whether your condition is chronic or acute.  If caused by a virus, your symptoms should go away on their own within 10 days. You may be given medicines to relieve symptoms. They include: ? Medicines that shrink swollen nasal passages (topical intranasal decongestants). ? Medicines that treat allergies (antihistamines). ? A spray that eases inflammation of the nostrils (topical intranasal corticosteroids). ? Rinses that help get rid of thick mucus in your nose (nasal saline washes).  If caused by bacteria, your health care provider may recommend waiting to see if your symptoms improve. Most bacterial infections will get better without antibiotic medicine. You may be given antibiotics if you have: ? A severe infection. ? A weak immune system.  If caused by narrow nasal passages or nasal polyps, you may need to have surgery. Follow these instructions at home: Medicines  Take, use, or apply over-the-counter and prescription medicines only as told by your health care provider. These may include nasal sprays.  If you were prescribed an antibiotic medicine, take it as told by your health care provider. Do not stop taking the antibiotic even if you start   to feel better. Hydrate and humidify   Drink enough fluid to keep your urine pale yellow. Staying hydrated will help to thin your mucus.  Use a cool mist humidifier to keep the humidity level in your home above 50%.  Inhale steam for 10-15 minutes, 3-4 times a day, or as told by your health care provider. You can do this in the bathroom while a hot shower is  running.  Limit your exposure to cool or dry air. Rest  Rest as much as possible.  Sleep with your head raised (elevated).  Make sure you get enough sleep each night. General instructions   Apply a warm, moist washcloth to your face 3-4 times a day or as told by your health care provider. This will help with discomfort.  Wash your hands often with soap and water to reduce your exposure to germs. If soap and water are not available, use hand sanitizer.  Do not smoke. Avoid being around people who are smoking (secondhand smoke).  Keep all follow-up visits as told by your health care provider. This is important. Contact a health care provider if:  You have a fever.  Your symptoms get worse.  Your symptoms do not improve within 10 days. Get help right away if:  You have a severe headache.  You have persistent vomiting.  You have severe pain or swelling around your face or eyes.  You have vision problems.  You develop confusion.  Your neck is stiff.  You have trouble breathing. Summary  Sinusitis is soreness and inflammation of your sinuses. Sinuses are hollow spaces in the bones around your face.  This condition is caused by nasal tissues that become inflamed or swollen. The swelling traps or blocks the flow of mucus. This allows bacteria, viruses, and fungi to grow, which leads to infection.  If you were prescribed an antibiotic medicine, take it as told by your health care provider. Do not stop taking the antibiotic even if you start to feel better.  Keep all follow-up visits as told by your health care provider. This is important. This information is not intended to replace advice given to you by your health care provider. Make sure you discuss any questions you have with your health care provider. Document Released: 08/19/2005 Document Revised: 01/19/2018 Document Reviewed: 01/19/2018 Elsevier Patient Education  2020 [employer]  

## 2019-06-13 NOTE — Assessment & Plan Note (Signed)
Antihistamines, nasal saline prn

## 2019-06-13 NOTE — Assessment & Plan Note (Signed)
Had done very well for months but has had symptoms return. Placed on a course of Doxycycline and Mucinex bid.

## 2019-06-13 NOTE — Progress Notes (Signed)
Virtual Visit via Telephone Note  10/8/20at  9:20 AM EDT by telephone and verified that I am speaking with the correct person using two identifiers.  Location: Patient: home Provider: office   I discussed the limitations, risks, security and privacy concerns of performing an evaluation and management service by telephone and the availability of in person appointments. I also discussed with the patient that there may be a patient responsible charge related to this service. The patient expressed understanding and agreed to proceed. Attempted Video visit first but were unable to complete    Subjective:    Patient ID: [name], male    DOB: 25-Nov-1945, 73 y.o.   MRN: [id number]  No chief complaint on file.   HPI Patient is in today for follow up on chronic medical concerns including COPD, recurrent sinusitis, hyperglycemia and more. No recent febrile illness or hospitalizations. His breathing and sinuses have been better since last visit until the last couple of days when sinus congestion, PND and malaise are noted. Denies CP/palp/SOB/HA/fevers/GI or GU c/o. Taking meds as prescribed  Past Medical History:  Diagnosis Date  . Allergic rhinitis   . Atypical chest pain 12/01/2014  . Back pain 01/12/2013  . COPD (chronic obstructive pulmonary disease) (HCC)    FeV1 64%-2007  . Emphysema   . GERD (gastroesophageal reflux disease)   . History of lung abscess    bronchiectasis with RMLandRLL ersection -1996- Dr Arlyce Dice  . Hordeolum externum (stye) 04/23/2015   Right eye  . Hypertension   . Impaired vision    glasses  . Osteoarthritis    hands and knees  . Sinusitis, acute 04/23/2015    Past Surgical History:  Procedure Laterality Date  . HERNIA REPAIR  10/2009  . left knee    . lower back surgery  09/2008   06/2009  . LUNG SURGERY     RML andRUL removed due to bleeding and bronchiectasis and lung abcess  . NECK SURGERY  05/2008  . right foot surgery      Family History   Problem Relation Age of Onset  . Prostate cancer Father        father died prostate ca  . Coronary artery disease Other        1st degree relative<60  . Stroke Other        1st degree relative<50  . Heart failure Mother        age 28  . Colon cancer Paternal Grandmother   . Obstructive Sleep Apnea Brother   . Obesity Brother     Social History   Socioeconomic History  . Marital status: Married    Spouse name: Not on file  . Number of children: Not on file  . Years of education: Not on file  . Highest education level: Not on file  Occupational History  . Occupation: retired Music therapist  Social Needs  . Financial resource strain: Not on file  . Food insecurity    Worry: Not on file    Inability: Not on file  . Transportation needs    Medical: Not on file    Non-medical: Not on file  Tobacco Use  . Smoking status: Former Smoker    Packs/day: 1.50    Years: 38.00    Pack years: 57.00    Types: Cigarettes    Quit date: 09/03/2000    Years since quitting: 18.7  . Smokeless tobacco: Never Used  Substance and Sexual Activity  . Alcohol use: Yes  Alcohol/week: 2.0 - 3.0 standard drinks    Types: 2 - 3 Cans of beer per week  . Drug use: No  . Sexual activity: Never  Lifestyle  . Physical activity    Days per week: Not on file    Minutes per session: Not on file  . Stress: Not on file  Relationships  . Social Herbalist on phone: Not on file    Gets together: Not on file    Attends religious service: Not on file    Active member of club or organization: Not on file    Attends meetings of clubs or organizations: Not on file    Relationship status: Not on file  . Intimate partner violence    Fear of current or ex partner: Not on file    Emotionally abused: Not on file    Physically abused: Not on file    Forced sexual activity: Not on file  Other Topics Concern  . Not on file  Social History Narrative   Retired Music therapist   Patient  states former smoker. 1 1/2 ppd x 38 yrs  Quit in Jan 2002   Married - 2 weeks (4th marriage)   divorced,  remarried 84-2004 (lost wife to lung ca),  remarried (divorced),    1 son  - 20 [name])   Alcohol use-yes (2-3 beers per week)            Outpatient Medications Prior to Visit  Medication Sig Dispense Refill  . albuterol (PROVENTIL HFA;VENTOLIN HFA) 108 (90 Base) MCG/ACT inhaler Inhale 2 puffs into the lungs every 6 (six) hours as needed for wheezing or shortness of breath. 3 Inhaler 3  . amLODipine (NORVASC) 5 MG tablet Take 1 tablet (5 mg total) by mouth daily. 90 tablet 2  . aspirin-acetaminophen-caffeine (EXCEDRIN MIGRAINE) [id number] MG per tablet Take 2 tablets by mouth every 6 (six) hours as needed (arthritis pain).     . calcium elemental as carbonate (TUMS ULTRA 1000) 400 MG chewable tablet Chew 1,000 mg by mouth 2 (two) times daily.    . Capsaicin-Menthol (SALONPAS GEL EX) Apply 1 application topically daily as needed (arthritis pain).    . fluticasone (FLONASE) 50 MCG/ACT nasal spray Place 2 sprays into both nostrils daily as needed for allergies or rhinitis. 48 g 3  . Fluticasone-Umeclidin-Vilant (TRELEGY ELLIPTA) 100-62.5-25 MCG/INH AEPB Inhale 1 puff into the lungs daily. 60 each 3  . Glucosamine-Chondroitin 750-600 MG TABS Take 1 tablet by mouth 2 (two) times daily.    . Guaifenesin (MUCINEX MAXIMUM STRENGTH) 1200 MG TB12 Take 600 mg by mouth 2 (two) times daily.    . imiquimod (ALDARA) 5 % cream Apply topically 3 (three) times a week. 12 each 2  . loratadine (CLARITIN) 10 MG tablet Take 10 mg by mouth daily.    . meloxicam (MOBIC) 15 MG tablet Take 1 tablet (15 mg total) by mouth daily as needed for pain. 90 tablet 0  . Menthol (ROBITUSSIN COUGH DROPS MT) Use as directed 1 lozenge in the mouth or throat 4 (four) times daily as needed (cough).     . Menthol, Topical Analgesic, (BLUE-EMU MAXIMUM STRENGTH EX) Apply 1 application topically daily as needed (arthritis pain).     . montelukast (SINGULAIR) 10 MG tablet Take 1 tablet (10 mg total) by mouth daily. 90 tablet 3  . Multiple Vitamin (MULTIVITAMIN WITH MINERALS) TABS tablet Take 1 tablet by mouth on Monday, Wednesday, Friday with supper     .  Neomy-Bacit-Polymyx-Pramoxine (TRIPLE ANTIBIOTIC PAIN RELIEF EX) Apply 1 application topically daily as needed (wound care).    Marland Kitchen doxycycline (VIBRA-TABS) 100 MG tablet Take 1 tablet (100 mg total) by mouth 2 (two) times daily. 20 tablet 0   No facility-administered medications prior to visit.     No Known Allergies  Review of Systems  Constitutional: Negative for fever and malaise/fatigue.  HENT: Positive for congestion.   Eyes: Negative for blurred vision.  Respiratory: Positive for sputum production and shortness of breath.   Cardiovascular: Negative for chest pain, palpitations and leg swelling.  Gastrointestinal: Negative for abdominal pain, blood in stool and nausea.  Genitourinary: Negative for dysuria and frequency.  Musculoskeletal: Negative for falls.  Skin: Negative for rash.  Neurological: Negative for dizziness, loss of consciousness and headaches.  Endo/Heme/Allergies: Negative for environmental allergies.  Psychiatric/Behavioral: Negative for depression. The patient is not nervous/anxious.        Objective:    Physical Exam Unable to obtain via phone  SpO2 98%  Wt Readings from Last 3 Encounters:  09/28/18 152 lb 9.6 oz (69.2 kg)  09/08/18 158 lb 4.8 oz (71.8 kg)  09/07/18 152 lb 9.6 oz (69.2 kg)    Diabetic Foot Exam - Simple   No data filed     Lab Results  Component Value Date   WBC 6.3 06/02/2019   HGB 14.1 06/02/2019   HCT 42.7 06/02/2019   PLT 271.0 06/02/2019   GLUCOSE 99 06/02/2019   CHOL 146 06/02/2019   TRIG 168.0 (H) 06/02/2019   HDL 46.10 06/02/2019   LDLDIRECT 85 07/22/2014   LDLCALC 66 06/02/2019   ALT 16 06/02/2019   AST 23 06/02/2019   NA 140 06/02/2019   K 4.2 06/02/2019   CL 104 06/02/2019   CREATININE  0.81 06/02/2019   BUN 19 06/02/2019   CO2 26 06/02/2019   TSH 1.06 06/02/2019   PSA 2.06 09/28/2018   INR 1.18 09/28/2016   HGBA1C 5.9 06/02/2019    Lab Results  Component Value Date   TSH 1.06 06/02/2019   Lab Results  Component Value Date   WBC 6.3 06/02/2019   HGB 14.1 06/02/2019   HCT 42.7 06/02/2019   MCV 93.3 06/02/2019   PLT 271.0 06/02/2019   Lab Results  Component Value Date   NA 140 06/02/2019   K 4.2 06/02/2019   CO2 26 06/02/2019   GLUCOSE 99 06/02/2019   BUN 19 06/02/2019   CREATININE 0.81 06/02/2019   BILITOT 0.7 06/02/2019   ALKPHOS 88 06/02/2019   AST 23 06/02/2019   ALT 16 06/02/2019   PROT 6.9 06/02/2019   ALBUMIN 4.0 06/02/2019   CALCIUM 9.4 06/02/2019   ANIONGAP 6 09/30/2016   GFR 93.52 06/02/2019   Lab Results  Component Value Date   CHOL 146 06/02/2019   Lab Results  Component Value Date   HDL 46.10 06/02/2019   Lab Results  Component Value Date   LDLCALC 66 06/02/2019   Lab Results  Component Value Date   TRIG 168.0 (H) 06/02/2019   Lab Results  Component Value Date   CHOLHDL 3 06/02/2019   Lab Results  Component Value Date   HGBA1C 5.9 06/02/2019       Assessment & Plan:   Problem List Items Addressed This Visit    Benign essential HTN    Monitor vitals weekly and report concerns, no changes to meds. Encouraged heart healthy diet such as the DASH diet and exercise as tolerated.  Allergic rhinitis    Antihistamines, nasal saline prn      COPD GOLD II D    He has begun to use her Albuterol prior to activity and that has been helpful      Sinusitis, acute, maxillary    Had done very well for months but has had symptoms return. Placed on a course of Doxycycline and Mucinex bid.       Relevant Medications   doxycycline (VIBRA-TABS) 100 MG tablet   Hyperglycemia    hgba1c acceptable, minimize simple carbs. Increase exercise as tolerated.          I am having [name] "Al" maintain his  aspirin-acetaminophen-caffeine, loratadine, Menthol (ROBITUSSIN COUGH DROPS MT), Guaifenesin, Glucosamine-Chondroitin, calcium elemental as carbonate, multivitamin with minerals, Capsaicin-Menthol (SALONPAS GEL EX), (Menthol, Topical Analgesic, (BLUE-EMU MAXIMUM STRENGTH EX)), Neomy-Bacit-Polymyx-Pramoxine (TRIPLE ANTIBIOTIC PAIN RELIEF EX), amLODipine, montelukast, fluticasone, meloxicam, imiquimod, albuterol, Trelegy Ellipta, and doxycycline.  Meds ordered this encounter  Medications  . doxycycline (VIBRA-TABS) 100 MG tablet    Sig: Take 1 tablet (100 mg total) by mouth 2 (two) times daily.    Dispense:  28 tablet    Refill:  0     I discussed the assessment and treatment plan with the patient. The patient was provided an opportunity to ask questions and all were answered. The patient agreed with the plan and demonstrated an understanding of the instructions.   The patient was advised to call back or seek an in-person evaluation if the symptoms worsen or if the condition fails to improve as anticipated.  I provided 25 minutes of non-face-to-face time during this encounter.   Penni Homans, MD

## 2019-06-13 NOTE — Assessment & Plan Note (Signed)
hgba1c acceptable, minimize simple carbs. Increase exercise as tolerated.  

## 2019-06-13 NOTE — Assessment & Plan Note (Signed)
He has begun to use her Albuterol prior to activity and that has been helpful

## 2019-06-13 NOTE — Assessment & Plan Note (Signed)
Monitor vitals weekly and report concerns, no changes to meds. Encouraged heart healthy diet such as the DASH diet and exercise as tolerated.

## 2019-06-29 ENCOUNTER — Other Ambulatory Visit: Payer: Self-pay | Admitting: Family Medicine

## 2019-06-29 ENCOUNTER — Encounter: Payer: Self-pay | Admitting: Family Medicine

## 2019-06-29 MED ORDER — METHYLPREDNISOLONE 4 MG PO TABS: ORAL_TABLET | ORAL | 0 refills | Status: DC

## 2019-06-29 MED ORDER — AMOXICILLIN 500 MG PO CAPS: 500.0000 mg | ORAL_CAPSULE | Freq: Three times a day (TID) | ORAL | 0 refills | Status: DC

## 2019-06-29 NOTE — Telephone Encounter (Signed)
Please advise 

## 2019-07-10 ENCOUNTER — Other Ambulatory Visit: Payer: Self-pay | Admitting: Family Medicine

## 2019-07-10 ENCOUNTER — Other Ambulatory Visit: Payer: Self-pay | Admitting: Pulmonary Disease

## 2019-08-16 ENCOUNTER — Other Ambulatory Visit: Payer: Self-pay | Admitting: Family Medicine

## 2019-09-10 ENCOUNTER — Other Ambulatory Visit: Payer: Self-pay | Admitting: Pulmonary Disease

## 2019-09-14 ENCOUNTER — Other Ambulatory Visit: Payer: Self-pay | Admitting: Emergency Medicine

## 2019-09-14 MED ORDER — TRELEGY ELLIPTA 100-62.5-25 MCG/INH IN AEPB: 1.0000 | INHALATION_SPRAY | Freq: Every day | RESPIRATORY_TRACT | 1 refills | Status: DC

## 2019-09-19 NOTE — Progress Notes (Signed)
Cardiology Office Note    Date:  09/28/2019   ID:  [name], DOB 1946-04-03, MRN [id number]  PCP:  Mosie Lukes, MD  Cardiologist:  Dr. Johnsie Cancel   CC: Dyspnea Lendell Caprice  History of Present Illness:  [name] is a 74 y.o. male with a history of COPD, lung abscess in 1996 s/p RML/RLL resection, HTN and PVCs who presents to clinic for f/u   He has a history of atypical chest pain in the setting of a URI dating back to 2014. 2D ECHO in 2014 and 2015 showed normal LV function and no valvular abnormalities. PVCs are felt to be related to lung disease.   I saw him in clinic on 07/09/16 and he was doing quite well.   He was admitted 1/28-1/29/18 for COPD exacerbation and CAP. Treated with IV antibiotics, steroids, nebulized broncho-dilators. 57 pack-year history of smoking  Seen by pulmonary 09/07/17 for acute sinusitis placed on prednisone Taper and trelegy as well as Augmentin   No history of documented CAD Retired from [employer] earlier this week with wife No cardiac issues Breathing is good  Home BP's fine   Past Medical History:  Diagnosis Date  . Allergic rhinitis   . Atypical chest pain 12/01/2014  . Back pain 01/12/2013  . COPD (chronic obstructive pulmonary disease) (HCC)    FeV1 64%-2007  . Emphysema   . GERD (gastroesophageal reflux disease)   . History of lung abscess    bronchiectasis with RMLandRLL ersection -1996- Dr Arlyce Dice  . Hordeolum externum (stye) 04/23/2015   Right eye  . Hypertension   . Impaired vision    glasses  . Osteoarthritis    hands and knees  . Sinusitis, acute 04/23/2015    Past Surgical History:  Procedure Laterality Date  . HERNIA REPAIR  10/2009  . left knee    . lower back surgery  09/2008   06/2009  . LUNG SURGERY     RML andRUL removed due to bleeding and bronchiectasis and lung abcess  . NECK SURGERY  05/2008  . right foot surgery      Current Medications: Outpatient Medications Prior to Visit    Medication Sig Dispense Refill  . albuterol (PROVENTIL HFA;VENTOLIN HFA) 108 (90 Base) MCG/ACT inhaler Inhale 2 puffs into the lungs every 6 (six) hours as needed for wheezing or shortness of breath. 3 Inhaler 3  . amLODipine (NORVASC) 5 MG tablet TAKE 1 TABLET EVERY DAY 90 tablet 1  . aspirin-acetaminophen-caffeine (EXCEDRIN MIGRAINE) [id number] MG per tablet Take 2 tablets by mouth every 6 (six) hours as needed (arthritis pain).     . calcium elemental as carbonate (TUMS ULTRA 1000) 400 MG chewable tablet Chew 1,000 mg by mouth 2 (two) times daily.    . Capsaicin-Menthol (SALONPAS GEL EX) Apply 1 application topically daily as needed (arthritis pain).    . fluticasone (FLONASE) 50 MCG/ACT nasal spray Place 2 sprays into both nostrils daily as needed for allergies or rhinitis. 48 g 3  . Fluticasone-Umeclidin-Vilant (TRELEGY ELLIPTA) 100-62.5-25 MCG/INH AEPB Inhale 1 puff into the lungs daily. 180 each 1  . Glucosamine-Chondroitin 750-600 MG TABS Take 1 tablet by mouth 2 (two) times daily.    . Guaifenesin (MUCINEX MAXIMUM STRENGTH) 1200 MG TB12 Take 600 mg by mouth 2 (two) times daily.    . imiquimod (ALDARA) 5 % cream Apply topically 3 (three) times a week. 12 each 2  . loratadine (CLARITIN) 10 MG  tablet Take 10 mg by mouth daily.    . meloxicam (MOBIC) 15 MG tablet TAKE 1 TABLET (15 MG TOTAL) BY MOUTH DAILY AS NEEDED FOR PAIN. 90 tablet 0  . Menthol (ROBITUSSIN COUGH DROPS MT) Use as directed 1 lozenge in the mouth or throat 4 (four) times daily as needed (cough).     . Menthol, Topical Analgesic, (BLUE-EMU MAXIMUM STRENGTH EX) Apply 1 application topically daily as needed (arthritis pain).    . montelukast (SINGULAIR) 10 MG tablet Take 1 tablet (10 mg total) by mouth daily. 90 tablet 3  . Multiple Vitamin (MULTIVITAMIN WITH MINERALS) TABS tablet Take 1 tablet by mouth on Monday, Wednesday, Friday with supper     . Neomy-Bacit-Polymyx-Pramoxine (TRIPLE ANTIBIOTIC PAIN RELIEF EX) Apply 1  application topically daily as needed (wound care).    Marland Kitchen amoxicillin (AMOXIL) 500 MG capsule Take 1 capsule (500 mg total) by mouth 3 (three) times daily. 21 capsule 0  . doxycycline (VIBRA-TABS) 100 MG tablet Take 1 tablet (100 mg total) by mouth 2 (two) times daily. 28 tablet 0  . methylPREDNISolone (MEDROL) 4 MG tablet 5 tab po qd X 1d then 4 tab po qd X 1d then 3 tab po qd X 1d then 2 tab po qd then 1 tab po qd 15 tablet 0   No facility-administered medications prior to visit.     Allergies:   Patient has no known allergies.   Social History   Socioeconomic History  . Marital status: Married    Spouse name: Not on file  . Number of children: Not on file  . Years of education: Not on file  . Highest education level: Not on file  Occupational History  . Occupation: retired Music therapist  Tobacco Use  . Smoking status: Former Smoker    Packs/day: 1.50    Years: 38.00    Pack years: 57.00    Types: Cigarettes    Quit date: 09/03/2000    Years since quitting: 19.0  . Smokeless tobacco: Never Used  Substance and Sexual Activity  . Alcohol use: Yes    Alcohol/week: 2.0 - 3.0 standard drinks    Types: 2 - 3 Cans of beer per week  . Drug use: No  . Sexual activity: Never  Other Topics Concern  . Not on file  Social History Narrative   Retired Music therapist   Patient states former smoker. 1 1/2 ppd x 38 yrs  Quit in Jan 2002   Married - 2 weeks (4th marriage)   divorced,  remarried 84-2004 (lost wife to lung ca),  remarried (divorced),    1 son  - 25 [name])   Alcohol use-yes (2-3 beers per week)           Social Determinants of Health   Financial Resource Strain:   . Difficulty of Paying Living Expenses: Not on file  Food Insecurity:   . Worried About Charity fundraiser in the Last Year: Not on file  . Ran Out of Food in the Last Year: Not on file  Transportation Needs:   . Lack of Transportation (Medical): Not on file  . Lack of Transportation  (Non-Medical): Not on file  Physical Activity:   . Days of Exercise per Week: Not on file  . Minutes of Exercise per Session: Not on file  Stress:   . Feeling of Stress : Not on file  Social Connections:   . Frequency of Communication with Friends and Family: Not  on file  . Frequency of Social Gatherings with Friends and Family: Not on file  . Attends Religious Services: Not on file  . Active Member of Clubs or Organizations: Not on file  . Attends Archivist Meetings: Not on file  . Marital Status: Not on file     Family History:  The patient's family history includes Colon cancer in his paternal grandmother; Coronary artery disease in an other family member; Heart failure in his mother; Obesity in his brother; Obstructive Sleep Apnea in his brother; Prostate cancer in his father; Stroke in an other family member.      ROS:   Please see the history of present illness.    ROS All other systems reviewed and are negative.   PHYSICAL EXAM:   VS:  BP (!) 150/78   Pulse 83   Ht 5\' 7"  (1.702 m)   Wt 148 lb 6.4 oz (67.3 kg)   SpO2 94%   BMI 23.24 kg/m    Affect appropriate Chronically ill male  HEENT: mild sinus tenderness  Neck supple with no adenopathy JVP normal no bruits no thyromegaly Lungs rhonchi exp wheezing and good diaphragmatic motion Post thoracotomy on right  Heart:  S1/S2 no murmur, no rub, gallop or click PMI normal Abdomen: benighn, BS positve, no tenderness, no AAA no bruit.  No HSM or HJR Distal pulses intact with no bruits No edema Neuro non-focal Skin warm and dry No muscular weakness    Wt Readings from Last 3 Encounters:  09/28/19 148 lb 6.4 oz (67.3 kg)  09/28/18 152 lb 9.6 oz (69.2 kg)  09/08/18 158 lb 4.8 oz (71.8 kg)      EKG:   09/08/17 SR rate 88 PVC ICRBBB 09/08/18 SR rate 93 PVC ICRBBB no changes 09/28/19 SR rate 86 PAC   Recent Labs: 06/02/2019: ALT 16; BUN 19; Creatinine, Ser 0.81; Hemoglobin 14.1; Platelets 271.0; Potassium 4.2;  Sodium 140; TSH 1.06   Lipid Panel    Component Value Date/Time   CHOL 146 06/02/2019 0821   TRIG 168.0 (H) 06/02/2019 0821   HDL 46.10 06/02/2019 0821   CHOLHDL 3 06/02/2019 0821   VLDL 33.6 06/02/2019 0821   LDLCALC 66 06/02/2019 0821   LDLDIRECT 85 07/22/2014 1031    Additional studies/ records that were reviewed today include:  2D ECHO: 01/03/2015 LV EF: 55% - 60% Study Conclusions - Left ventricle: The cavity size was normal. Wall thickness was normal. Systolic function was normal. The estimated ejection fraction was in the range of 55% to 60%. Wall motion was normal; there were no regional wall motion abnormalities. Doppler parameters are consistent with abnormal left ventricular relaxation (grade 1 diastolic dysfunction). The E/e&' ratio is between 8-15, suggseting indeterminate LV Filling pressure. - Left atrium: The atrium was normal in size. - Right atrium: The atrium was at the upper limits of normal in size. - Inferior vena cava: The vessel was normal in size. The respirophasic diameter changes were in the normal range (>= 50%), consistent with normal central venous pressure. Impressions: - Compared to a prior echo in 2014, there do not appear to be any significant changes   ASSESSMENT & PLAN:   HTN: Well controlled.  Continue current medications and low sodium Dash type diet.    PVCs: stable. Continue BB EF normal by TTE 01/03/15  no documented CAD will update Echo  COPD: stable lung cancer screening CT negative 09/28/18 decreased size nodules and chronic changes from right middle And lower lobectomy  Update echo for dyspnea and PVCls  F/u in a year if EF normal an no pulmonary HTN   Jenkins Rouge

## 2019-09-23 ENCOUNTER — Encounter: Payer: Self-pay | Admitting: Family Medicine

## 2019-09-27 ENCOUNTER — Other Ambulatory Visit: Payer: Self-pay | Admitting: Family Medicine

## 2019-09-28 ENCOUNTER — Other Ambulatory Visit: Payer: Self-pay

## 2019-09-28 ENCOUNTER — Ambulatory Visit: Payer: Medicare HMO | Admitting: Cardiovascular Disease

## 2019-09-28 ENCOUNTER — Encounter: Payer: Self-pay | Admitting: Cardiovascular Disease

## 2019-09-28 ENCOUNTER — Telehealth: Payer: Self-pay | Admitting: *Deleted

## 2019-09-28 VITALS — BP 150/78 | HR 83 | Ht 67.0 in | Wt 148.4 lb

## 2019-09-28 DIAGNOSIS — R0600 Dyspnea, unspecified: Secondary | ICD-10-CM

## 2019-09-28 DIAGNOSIS — I493 Ventricular premature depolarization: Secondary | ICD-10-CM

## 2019-09-28 MED ORDER — ALBUTEROL SULFATE HFA 108 (90 BASE) MCG/ACT IN AERS: 2.0000 | INHALATION_SPRAY | Freq: Four times a day (QID) | RESPIRATORY_TRACT | 1 refills | Status: DC | PRN

## 2019-09-28 NOTE — Patient Instructions (Addendum)
Medication Instructions:   *If you need a refill on your cardiac medications before your next appointment, please call your pharmacy*  Lab Work:  If you have labs (blood work) drawn today and your tests are completely normal, you will receive your results only by: . MyChart Message (if you have MyChart) OR . A paper copy in the mail If you have any lab test that is abnormal or we need to change your treatment, we will call you to review the results.  Testing/Procedures: Your physician has requested that you have an echocardiogram. Echocardiography is a painless test that uses sound waves to create images of your heart. It provides your doctor with information about the size and shape of your heart and how well your heart's chambers and valves are working. This procedure takes approximately one hour. There are no restrictions for this procedure.  Follow-Up: At [facility], you and your health needs are our priority.  As part of our continuing mission to provide you with exceptional heart care, we have created designated Provider Care Teams.  These Care Teams include your primary Cardiologist (physician) and Advanced Practice Providers (APPs -  Physician Assistants and Nurse Practitioners) who all work together to provide you with the care you need, when you need it.  Your next appointment:   12 month(s)  The format for your next appointment:   In Person  Provider:   You may see Peter Nishan, MD or one of the following Advanced Practice Providers on your designated Care Team:    Lori Gerhardt, NP  Laura Ingold, NP  Jill McDaniel, NP   

## 2019-09-28 NOTE — Telephone Encounter (Signed)
Received fax from [facility] for albuterol refill. Refills sent.

## 2019-09-29 ENCOUNTER — Encounter: Payer: Self-pay | Admitting: Pulmonary Disease

## 2019-09-29 ENCOUNTER — Ambulatory Visit (HOSPITAL_COMMUNITY): Payer: Medicare HMO | Attending: Cardiovascular Disease

## 2019-09-29 ENCOUNTER — Ambulatory Visit: Payer: Medicare HMO | Admitting: Pulmonary Disease

## 2019-09-29 DIAGNOSIS — R911 Solitary pulmonary nodule: Secondary | ICD-10-CM | POA: Diagnosis not present

## 2019-09-29 DIAGNOSIS — J432 Centrilobular emphysema: Secondary | ICD-10-CM

## 2019-09-29 DIAGNOSIS — R0600 Dyspnea, unspecified: Secondary | ICD-10-CM

## 2019-09-29 NOTE — Assessment & Plan Note (Signed)
Continue on Trelegy Albuterol as needed only.  2 flareups per year-we discussed plan for episodes of bronchitis

## 2019-09-29 NOTE — Assessment & Plan Note (Signed)
Schedule CT chest without contrast in October 2021 to follow-up on lung nodule -would be a 2-year follow-up and if negative then no further imaging required

## 2019-09-29 NOTE — Patient Instructions (Signed)
Continue on Trelegy.  Schedule CT chest without contrast in October 2021 to follow-up on lung nodule  Call us as needed

## 2019-09-29 NOTE — Progress Notes (Signed)
   Subjective:    Patient ID: [name], male    DOB: [birth year], 74 y.o.   MRN: [id number]  HPI  74 yo smoker with COPD , gold C and left lower lobe lung nodule Lung abscess in 1996 s/p RML resection  quit smoking in 2002  Chief Complaint  Patient presents with  . Follow-up    COPD - not as much congestion or bronchitis as before.   He has done well over the past year, 2 flareups requiring short courses of prednisone and antibiotics given by PCP. Trelegy has really worked wonders for him and his breathing is improved He rarely needs albuterol except he takes this regularly before his Trelegy.  This opens up his lungs.  He got his first Covid shot on 1/25  Reviewed CT chest    Significant tests/ events reviewed  2014 -Spirometry FEV1 53%, ratio 54  CT chest 09/2018 slight decrease in left lower lobe pulmonary nodule from 7 mm to 5 mm compared to 06/2018  Review of Systems neg for any significant sore throat, dysphagia, itching, sneezing, nasal congestion or excess/ purulent secretions, fever, chills, sweats, unintended wt loss, pleuritic or exertional cp, hempoptysis, orthopnea pnd or change in chronic leg swelling. Also denies presyncope, palpitations, heartburn, abdominal pain, nausea, vomiting, diarrhea or change in bowel or urinary habits, dysuria,hematuria, rash, arthralgias, visual complaints, headache, numbness weakness or ataxia.     Objective:   Physical Exam  Gen. Pleasant, very thin man,, in no distress ENT - no thrush, no pallor/icterus,no post nasal drip Neck: No JVD, no thyromegaly, no carotid bruits Lungs: no use of accessory muscles, no dullness to percussion, clear without rales or rhonchi  Cardiovascular: Rhythm regular, heart sounds  normal, no murmurs or gallops, no peripheral edema Musculoskeletal: No deformities, no cyanosis or clubbing         Assessment & Plan:

## 2019-09-30 ENCOUNTER — Ambulatory Visit: Payer: Medicare HMO | Admitting: Family Medicine

## 2019-10-01 ENCOUNTER — Telehealth: Payer: Self-pay | Admitting: Pulmonary Disease

## 2019-10-01 NOTE — Telephone Encounter (Signed)
I spoke with pt, he wanted to schedule his CT scan that was due in October. I advised pt that we call our patients 1 month before the requested date to schedule. Pt understood and nothing further is needed.

## 2019-10-26 ENCOUNTER — Telehealth: Payer: Self-pay | Admitting: Family Medicine

## 2019-10-26 ENCOUNTER — Other Ambulatory Visit: Payer: Self-pay | Admitting: Family Medicine

## 2019-10-26 MED ORDER — IMIQUIMOD 5 % EX CREA: TOPICAL_CREAM | CUTANEOUS | 2 refills | Status: DC

## 2019-10-26 MED ORDER — MUPIROCIN 2 % EX OINT: 1.0000 "application " | TOPICAL_OINTMENT | Freq: Two times a day (BID) | CUTANEOUS | 1 refills | Status: DC

## 2019-10-26 NOTE — Telephone Encounter (Signed)
Advise on these 2 refills/do not see the mupirocin 2 % on his list Last OV--06/10/2019

## 2019-10-26 NOTE — Telephone Encounter (Signed)
I sent the medications to [facility]

## 2019-10-26 NOTE — Telephone Encounter (Signed)
Caller Name: [name], pt Phone: [phone]  Medication: Pt does not go back to dermatologist until 03/2020 - his cpe with Dr. Charlett Blake had to be reschedule as she is out of office the original date - cpe now 12/23/2019  1. imiquimod (ALDARA) 5 % cream 2. Mupirocin Ointment 2%  Has the patient contacted their pharmacy? Yes - no refills  Preferred Pharmacy (with phone number or street name):  [place], [place] - [address] Phone:  [phone]  Fax:  [phone]

## 2019-10-27 NOTE — Telephone Encounter (Signed)
Called the patient informed refills requested done.

## 2019-11-05 ENCOUNTER — Other Ambulatory Visit: Payer: Self-pay | Admitting: Family Medicine

## 2019-11-08 ENCOUNTER — Encounter: Payer: Medicare HMO | Admitting: Family Medicine

## 2019-12-22 ENCOUNTER — Other Ambulatory Visit: Payer: Self-pay

## 2019-12-23 ENCOUNTER — Ambulatory Visit (HOSPITAL_BASED_OUTPATIENT_CLINIC_OR_DEPARTMENT_OTHER)
Admission: RE | Admit: 2019-12-23 | Discharge: 2019-12-23 | Disposition: A | Discharge status: Home / Self Care | Payer: Medicare HMO | Source: Ambulatory Visit | Attending: Family Medicine | Admitting: Family Medicine

## 2019-12-23 ENCOUNTER — Ambulatory Visit (INDEPENDENT_AMBULATORY_CARE_PROVIDER_SITE_OTHER): Payer: Medicare HMO | Admitting: Family Medicine

## 2019-12-23 VITALS — BP 154/77 | HR 94 | Temp 98.8°F | Resp 12 | Ht 67.0 in | Wt 149.6 lb

## 2019-12-23 DIAGNOSIS — I10 Essential (primary) hypertension: Secondary | ICD-10-CM | POA: Diagnosis not present

## 2019-12-23 DIAGNOSIS — Z0000 Encounter for general adult medical examination without abnormal findings: Secondary | ICD-10-CM | POA: Diagnosis not present

## 2019-12-23 DIAGNOSIS — J432 Centrilobular emphysema: Secondary | ICD-10-CM

## 2019-12-23 DIAGNOSIS — M1990 Unspecified osteoarthritis, unspecified site: Secondary | ICD-10-CM | POA: Diagnosis not present

## 2019-12-23 DIAGNOSIS — R351 Nocturia: Secondary | ICD-10-CM

## 2019-12-23 DIAGNOSIS — R739 Hyperglycemia, unspecified: Secondary | ICD-10-CM | POA: Diagnosis not present

## 2019-12-23 DIAGNOSIS — R2230 Localized swelling, mass and lump, unspecified upper limb: Secondary | ICD-10-CM | POA: Diagnosis not present

## 2019-12-23 DIAGNOSIS — M2550 Pain in unspecified joint: Secondary | ICD-10-CM

## 2019-12-23 DIAGNOSIS — E1169 Type 2 diabetes mellitus with other specified complication: Secondary | ICD-10-CM

## 2019-12-23 DIAGNOSIS — E785 Hyperlipidemia, unspecified: Secondary | ICD-10-CM | POA: Diagnosis not present

## 2019-12-23 DIAGNOSIS — M7989 Other specified soft tissue disorders: Secondary | ICD-10-CM | POA: Diagnosis not present

## 2019-12-23 MED ORDER — HYDROCHLOROTHIAZIDE 12.5 MG PO CAPS: 12.5000 mg | ORAL_CAPSULE | Freq: Every day | ORAL | 2 refills | Status: DC

## 2019-12-23 MED ORDER — MUPIROCIN 2 % EX OINT: 1.0000 "application " | TOPICAL_OINTMENT | Freq: Two times a day (BID) | CUTANEOUS | 1 refills | Status: DC

## 2019-12-23 MED ORDER — IMIQUIMOD 5 % EX CREA: TOPICAL_CREAM | CUTANEOUS | 1 refills | Status: DC

## 2019-12-23 MED FILL — HYDROCHLOROTHIAZIDE 12.5 MG: 12.5 | 30 days supply | Qty: 30 | Fill #0

## 2019-12-23 NOTE — Patient Instructions (Signed)
Omron Blood Pressure cuff, upper arm, want BP 100-140/60-90 Pulse oximeter, want oxygen in 90s  Weekly vitals  Take Multivitamin with minerals, selenium Vitamin D 1000-2000 IU daily Probiotic with lactobacillus and bifidophilus Asprin EC 81 mg daily Fish or krill oil daily Melatonin 2-5 mg at bedtime  [email or web address]  Tylenol/Arthritis ES 500  Mg tabs, 1-2 tabs up to 3 x day  Curcumen/Turmeric caps and black cherry extract for joint pains   Preventive Care 65 Years and Older, Male Preventive care refers to lifestyle choices and visits with your health care provider that can promote health and wellness. This includes:  A yearly physical exam. This is also called an annual well check.  Regular dental and eye exams.  Immunizations.  Screening for certain conditions.  Healthy lifestyle choices, such as diet and exercise. What can I expect for my preventive care visit? Physical exam Your health care provider will check:  Height and weight. These may be used to calculate body mass index (BMI), which is a measurement that tells if you are at a healthy weight.  Heart rate and blood pressure.  Your skin for abnormal spots. Counseling Your health care provider may ask you questions about:  Alcohol, tobacco, and drug use.  Emotional well-being.  Home and relationship well-being.  Sexual activity.  Eating habits.  History of falls.  Memory and ability to understand (cognition).  Work and work Statistician. What immunizations do I need?  Influenza (flu) vaccine  This is recommended every year. Tetanus, diphtheria, and pertussis (Tdap) vaccine  You may need a Td booster every 10 years. Varicella (chickenpox) vaccine  You may need this vaccine if you have not already been vaccinated. Zoster (shingles) vaccine  You may need this after age 34. Pneumococcal conjugate (PCV13) vaccine  One dose is recommended after age  26. Pneumococcal polysaccharide (PPSV23) vaccine  One dose is recommended after age 15. Measles, mumps, and rubella (MMR) vaccine  You may need at least one dose of MMR if you were born in 1957 or later. You may also need a second dose. Meningococcal conjugate (MenACWY) vaccine  You may need this if you have certain conditions. Hepatitis A vaccine  You may need this if you have certain conditions or if you travel or work in places where you may be exposed to hepatitis A. Hepatitis B vaccine  You may need this if you have certain conditions or if you travel or work in places where you may be exposed to hepatitis B. Haemophilus influenzae type b (Hib) vaccine  You may need this if you have certain conditions. You may receive vaccines as individual doses or as more than one vaccine together in one shot (combination vaccines). Talk with your health care provider about the risks and benefits of combination vaccines. What tests do I need? Blood tests  Lipid and cholesterol levels. These may be checked every 5 years, or more frequently depending on your overall health.  Hepatitis C test.  Hepatitis B test. Screening  Lung cancer screening. You may have this screening every year starting at age 40 if you have a 30-pack-year history of smoking and currently smoke or have quit within the past 15 years.  Colorectal cancer screening. All adults should have this screening starting at age 62 and continuing until age 45. Your health care provider may recommend screening at age 40 if you are at increased risk. You will have tests every 1-10 years, depending on your results and the type of screening test.  Prostate cancer screening. Recommendations will vary depending on your family history and other risks.  Diabetes screening. This is done by checking your blood sugar (glucose) after you have not eaten for a while (fasting). You may have this done every 1-3 years.  Abdominal aortic aneurysm  (AAA) screening. You may need this if you are a current or former smoker.  Sexually transmitted disease (STD) testing. Follow these instructions at home: Eating and drinking  Eat a diet that includes fresh fruits and vegetables, whole grains, lean protein, and low-fat dairy products. Limit your intake of foods with high amounts of sugar, saturated fats, and salt.  Take vitamin and mineral supplements as recommended by your health care provider.  Do not drink alcohol if your health care provider tells you not to drink.  If you drink alcohol: ? Limit how much you have to 0-2 drinks a day. ? Be aware of how much alcohol is in your drink. In the [place] one drink equals one 12 oz bottle of beer (355 mL), one 5 oz glass of wine (148 mL), or one 1 oz glass of hard liquor (44 mL). Lifestyle  Take daily care of your teeth and gums.  Stay active. Exercise for at least 30 minutes on 5 or more days each week.  Do not use any products that contain nicotine or tobacco, such as cigarettes, e-cigarettes, and chewing tobacco. If you need help quitting, ask your health care provider.  If you are sexually active, practice safe sex. Use a condom or other form of protection to prevent STIs (sexually transmitted infections).  Talk with your health care provider about taking a low-dose aspirin or statin. What's next?  Visit your health care provider once a year for a well check visit.  Ask your health care provider how often you should have your eyes and teeth checked.  Stay up to date on all vaccines. This information is not intended to replace advice given to you by your health care provider. Make sure you discuss any questions you have with your health care provider. Document Revised: 08/13/2018 Document Reviewed: 08/13/2018 Elsevier Patient Education  2020 [employer].

## 2019-12-23 NOTE — Assessment & Plan Note (Signed)
hgba1c acceptable, minimize simple carbs. Increase exercise as tolerated.  

## 2019-12-24 ENCOUNTER — Other Ambulatory Visit: Payer: Self-pay | Admitting: Family Medicine

## 2019-12-24 DIAGNOSIS — M7989 Other specified soft tissue disorders: Secondary | ICD-10-CM

## 2019-12-24 LAB — CBC
HCT: 42.1 % (ref 39.0–52.0)
Hemoglobin: 14.2 g/dL (ref 13.0–17.0)
MCHC: 33.8 g/dL (ref 30.0–36.0)
MCV: 95 fl (ref 78.0–100.0)
Platelets: 259 10*3/uL (ref 150.0–400.0)
RBC: 4.43 Mil/uL (ref 4.22–5.81)
RDW: 13.5 % (ref 11.5–15.5)
WBC: 4.4 10*3/uL (ref 4.0–10.5)

## 2019-12-24 LAB — COMPREHENSIVE METABOLIC PANEL
ALT: 16 U/L (ref 0–53)
AST: 24 U/L (ref 0–37)
Albumin: 4 g/dL (ref 3.5–5.2)
Alkaline Phosphatase: 87 U/L (ref 39–117)
BUN: 20 mg/dL (ref 6–23)
CO2: 27 mEq/L (ref 19–32)
Calcium: 9 mg/dL (ref 8.4–10.5)
Chloride: 105 mEq/L (ref 96–112)
Creatinine, Ser: 0.75 mg/dL (ref 0.40–1.50)
GFR: 102.05 mL/min (ref >60.00)
Glucose, Bld: 150 mg/dL — ABNORMAL HIGH (ref 70–99)
Potassium: 4 mEq/L (ref 3.5–5.1)
Sodium: 139 mEq/L (ref 135–145)
Total Bilirubin: 0.5 mg/dL (ref 0.2–1.2)
Total Protein: 6.6 g/dL (ref 6.0–8.3)

## 2019-12-24 LAB — SEDIMENTATION RATE: Sed Rate: 20 mm/hr (ref 0–20)

## 2019-12-24 LAB — LIPID PANEL
Cholesterol: 157 mg/dL (ref 0–200)
HDL: 44.4 mg/dL (ref >39.00)
LDL Cholesterol: 80 mg/dL (ref 0–99)
NonHDL: 112.42
Total CHOL/HDL Ratio: 4
Triglycerides: 163 mg/dL — ABNORMAL HIGH (ref 0.0–149.0)
VLDL: 32.6 mg/dL (ref 0.0–40.0)

## 2019-12-24 LAB — ANA: Anti Nuclear Antibody (ANA): NEGATIVE

## 2019-12-24 LAB — HEMOGLOBIN A1C: Hgb A1c MFr Bld: 5.5 % (ref 4.6–6.5)

## 2019-12-24 LAB — PSA: PSA: 2.55 ng/mL (ref 0.10–4.00)

## 2019-12-24 LAB — TSH: TSH: 0.8 u[IU]/mL (ref 0.35–4.50)

## 2019-12-24 LAB — RHEUMATOID FACTOR: Rheumatoid fact SerPl-aCnc: <14 IU/mL (ref <14)

## 2019-12-26 NOTE — Assessment & Plan Note (Signed)
Patient encouraged to maintain heart healthy diet, regular exercise, adequate sleep. Consider daily probiotics. Take medications as prescribed. Labs ordered and reviewed 

## 2019-12-26 NOTE — Assessment & Plan Note (Signed)
Well controlled, no changes to meds. Encouraged heart healthy diet such as the DASH diet and exercise as tolerated.  °

## 2019-12-26 NOTE — Progress Notes (Signed)
Subjective:    Patient ID: [name], male    DOB: [birth year], 74 y.o.   MRN: [id number]  Chief Complaint  Patient presents with  . Annual Exam    HPI Patient is in today for annual preventative exam and follow upon chronic medical concerns. No recent febrile illness or hospitalizations. He has been trying to eat well during the pandemic, does note that his exercise has been limited by joint pain. No recent trauma. Denies CP/palp/SOB/HA/congestion/fevers/GI or GU c/o. Taking meds as prescribed  Past Medical History:  Diagnosis Date  . Allergic rhinitis   . Atypical chest pain 12/01/2014  . Back pain 01/12/2013  . COPD (chronic obstructive pulmonary disease) (HCC)    FeV1 64%-2007  . Emphysema   . GERD (gastroesophageal reflux disease)   . History of lung abscess    bronchiectasis with RMLandRLL ersection -1996- Dr Arlyce Dice  . Hordeolum externum (stye) 04/23/2015   Right eye  . Hypertension   . Impaired vision    glasses  . Osteoarthritis    hands and knees  . Sinusitis, acute 04/23/2015    Past Surgical History:  Procedure Laterality Date  . HERNIA REPAIR  10/2009  . left knee    . lower back surgery  09/2008   06/2009  . LUNG SURGERY     RML andRUL removed due to bleeding and bronchiectasis and lung abcess  . NECK SURGERY  05/2008  . right foot surgery      Family History  Problem Relation Age of Onset  . Prostate cancer Father        father died prostate ca  . Coronary artery disease Other        1st degree relative<60  . Stroke Other        1st degree relative<50  . Heart failure Mother        age 66  . Colon cancer Paternal Grandmother   . Obstructive Sleep Apnea Brother   . Obesity Brother     Social History   Socioeconomic History  . Marital status: Married    Spouse name: Not on file  . Number of children: Not on file  . Years of education: Not on file  . Highest education level: Not on file  Occupational History  . Occupation: retired Administrator  Tobacco Use  . Smoking status: Former Smoker    Packs/day: 1.50    Years: 38.00    Pack years: 57.00    Types: Cigarettes    Quit date: 09/03/2000    Years since quitting: 19.3  . Smokeless tobacco: Never Used  Substance and Sexual Activity  . Alcohol use: Yes    Alcohol/week: 2.0 - 3.0 standard drinks    Types: 2 - 3 Cans of beer per week  . Drug use: No  . Sexual activity: Never  Other Topics Concern  . Not on file  Social History Narrative   Retired Music therapist   Patient states former smoker. 1 1/2 ppd x 38 yrs  Quit in Jan 2002   Married - 2 weeks (4th marriage)   divorced,  remarried 84-2004 (lost wife to lung ca),  remarried (divorced),    1 son  - 20 [name])   Alcohol use-yes (2-3 beers per week)           Social Determinants of Health   Financial Resource Strain:   . Difficulty of Paying Living Expenses:   Food Insecurity:   . Worried  About Running Out of Food in the Last Year:   . [place] in the Last Year:   Transportation Needs:   . Lack of Transportation (Medical):   Marland Kitchen Lack of Transportation (Non-Medical):   Physical Activity:   . Days of Exercise per Week:   . Minutes of Exercise per Session:   Stress:   . Feeling of Stress :   Social Connections:   . Frequency of Communication with Friends and Family:   . Frequency of Social Gatherings with Friends and Family:   . Attends Religious Services:   . Active Member of Clubs or Organizations:   . Attends Archivist Meetings:   Marland Kitchen Marital Status:   Intimate Partner Violence:   . Fear of Current or Ex-Partner:   . Emotionally Abused:   Marland Kitchen Physically Abused:   . Sexually Abused:     Outpatient Medications Prior to Visit  Medication Sig Dispense Refill  . albuterol (VENTOLIN HFA) 108 (90 Base) MCG/ACT inhaler Inhale 2 puffs into the lungs every 6 (six) hours as needed for wheezing or shortness of breath. 48 g 1  . amLODipine (NORVASC) 5 MG tablet TAKE 1 TABLET  EVERY DAY 90 tablet 1  . aspirin-acetaminophen-caffeine (EXCEDRIN MIGRAINE) [id number] MG per tablet Take 2 tablets by mouth every 6 (six) hours as needed (arthritis pain).     . calcium elemental as carbonate (TUMS ULTRA 1000) 400 MG chewable tablet Chew 1,000 mg by mouth 2 (two) times daily.    . Capsaicin-Menthol (SALONPAS GEL EX) Apply 1 application topically daily as needed (arthritis pain).    . fluticasone (FLONASE) 50 MCG/ACT nasal spray Place 2 sprays into both nostrils daily as needed for allergies or rhinitis. 48 g 3  . Fluticasone-Umeclidin-Vilant (TRELEGY ELLIPTA) 100-62.5-25 MCG/INH AEPB Inhale 1 puff into the lungs daily. 180 each 1  . Glucosamine-Chondroitin 750-600 MG TABS Take 1 tablet by mouth 2 (two) times daily.    . Guaifenesin (MUCINEX MAXIMUM STRENGTH) 1200 MG TB12 Take 600 mg by mouth 2 (two) times daily.    Marland Kitchen loratadine (CLARITIN) 10 MG tablet Take 10 mg by mouth daily.    . meloxicam (MOBIC) 15 MG tablet TAKE 1 TABLET (15 MG TOTAL) BY MOUTH DAILY AS NEEDED FOR PAIN. 90 tablet 0  . Menthol (ROBITUSSIN COUGH DROPS MT) Use as directed 1 lozenge in the mouth or throat 4 (four) times daily as needed (cough).     . Menthol, Topical Analgesic, (BLUE-EMU MAXIMUM STRENGTH EX) Apply 1 application topically daily as needed (arthritis pain).    . montelukast (SINGULAIR) 10 MG tablet TAKE 1 TABLET (10 MG TOTAL) BY MOUTH DAILY. 90 tablet 3  . Multiple Vitamin (MULTIVITAMIN WITH MINERALS) TABS tablet Take 1 tablet by mouth on Monday, Wednesday, Friday with supper     . imiquimod (ALDARA) 5 % cream Apply topically 3 (three) times a week. 12 each 2  . mupirocin ointment (BACTROBAN) 2 % Apply 1 application topically 2 (two) times daily. 22 g 1  . Neomy-Bacit-Polymyx-Pramoxine (TRIPLE ANTIBIOTIC PAIN RELIEF EX) Apply 1 application topically daily as needed (wound care).     No facility-administered medications prior to visit.    No Known Allergies  Review of Systems  Constitutional:  Negative for fever and malaise/fatigue.  HENT: Negative for congestion.   Eyes: Negative for blurred vision.  Respiratory: Negative for shortness of breath.   Cardiovascular: Negative for chest pain, palpitations and leg swelling.  Gastrointestinal: Negative for abdominal pain, blood in  stool and nausea.  Genitourinary: Negative for dysuria and frequency.  Musculoskeletal: Negative for falls.  Skin: Negative for rash.  Neurological: Negative for dizziness, loss of consciousness and headaches.  Endo/Heme/Allergies: Negative for environmental allergies.  Psychiatric/Behavioral: Negative for depression. The patient is not nervous/anxious.        Objective:    Physical Exam Vitals and nursing note reviewed.  Constitutional:      General: He is not in acute distress.    Appearance: He is well-developed.  HENT:     Head: Normocephalic and atraumatic.     Nose: Nose normal.  Eyes:     General:        Right eye: No discharge.        Left eye: No discharge.  Cardiovascular:     Rate and Rhythm: Normal rate and regular rhythm.     Pulses: Normal pulses.     Heart sounds: No murmur. No friction rub. No gallop.   Pulmonary:     Effort: Pulmonary effort is normal.     Breath sounds: Normal breath sounds.  Abdominal:     General: Bowel sounds are normal.     Palpations: Abdomen is soft.     Tenderness: There is no abdominal tenderness.  Musculoskeletal:     Cervical back: Normal range of motion and neck supple.  Skin:    General: Skin is warm and dry.  Neurological:     Mental Status: He is alert and oriented to person, place, and time.     BP (!) 154/77 (BP Location: Right Arm, Cuff Size: Normal)   Pulse 94   Temp 98.8 F (37.1 C) (Temporal)   Resp 12   Ht 5\' 7"  (1.702 m)   Wt 149 lb 9.6 oz (67.9 kg)   SpO2 98%   BMI 23.43 kg/m  Wt Readings from Last 3 Encounters:  12/23/19 149 lb 9.6 oz (67.9 kg)  09/29/19 147 lb (66.7 kg)  09/28/19 148 lb 6.4 oz (67.3 kg)     Diabetic Foot Exam - Simple   No data filed     Lab Results  Component Value Date   WBC 4.4 12/23/2019   HGB 14.2 12/23/2019   HCT 42.1 12/23/2019   PLT 259.0 12/23/2019   GLUCOSE 150 (H) 12/23/2019   CHOL 157 12/23/2019   TRIG 163.0 (H) 12/23/2019   HDL 44.40 12/23/2019   LDLDIRECT 85 07/22/2014   LDLCALC 80 12/23/2019   ALT 16 12/23/2019   AST 24 12/23/2019   NA 139 12/23/2019   K 4.0 12/23/2019   CL 105 12/23/2019   CREATININE 0.75 12/23/2019   BUN 20 12/23/2019   CO2 27 12/23/2019   TSH 0.80 12/23/2019   PSA 2.55 12/23/2019   INR 1.18 09/28/2016   HGBA1C 5.5 12/23/2019    Lab Results  Component Value Date   TSH 0.80 12/23/2019   Lab Results  Component Value Date   WBC 4.4 12/23/2019   HGB 14.2 12/23/2019   HCT 42.1 12/23/2019   MCV 95.0 12/23/2019   PLT 259.0 12/23/2019   Lab Results  Component Value Date   NA 139 12/23/2019   K 4.0 12/23/2019   CO2 27 12/23/2019   GLUCOSE 150 (H) 12/23/2019   BUN 20 12/23/2019   CREATININE 0.75 12/23/2019   BILITOT 0.5 12/23/2019   ALKPHOS 87 12/23/2019   AST 24 12/23/2019   ALT 16 12/23/2019   PROT 6.6 12/23/2019   ALBUMIN 4.0 12/23/2019   CALCIUM 9.0 12/23/2019  ANIONGAP 6 09/30/2016   GFR 102.05 12/23/2019   Lab Results  Component Value Date   CHOL 157 12/23/2019   Lab Results  Component Value Date   HDL 44.40 12/23/2019   Lab Results  Component Value Date   LDLCALC 80 12/23/2019   Lab Results  Component Value Date   TRIG 163.0 (H) 12/23/2019   Lab Results  Component Value Date   CHOLHDL 4 12/23/2019   Lab Results  Component Value Date   HGBA1C 5.5 12/23/2019       Assessment & Plan:   Problem List Items Addressed This Visit    Benign essential HTN - Primary    Well controlled, no changes to meds. Encouraged heart healthy diet such as the DASH diet and exercise as tolerated.       Relevant Medications   hydrochlorothiazide (MICROZIDE) 12.5 MG capsule   Other Relevant Orders    CBC (Completed)   Comprehensive metabolic panel (Completed)   TSH (Completed)   COPD GOLD II D   Arthritis    Has worsening diffuse pain, ANA and RF negative. Stay as active as able       Preventative health care    Patient encouraged to maintain heart healthy diet, regular exercise, adequate sleep. Consider daily probiotics. Take medications as prescribed. Labs ordered and reviewed      Nocturia   Relevant Orders   PSA (Completed)   Hyperglycemia    hgba1c acceptable, minimize simple carbs. Increase exercise as tolerated.       Relevant Orders   Hemoglobin A1c (Completed)   Hyperlipidemia associated with type 2 diabetes mellitus (HCC)    Encouraged heart healthy diet, increase exercise, avoid trans fats, consider a krill oil cap daily      Relevant Orders   Lipid panel (Completed)    Other Visit Diagnoses    Arthralgia, unspecified joint       Relevant Orders   Rheumatoid Factor (Completed)   Antinuclear Antib (ANA) (Completed)   Sedimentation rate (Completed)   Mass of shoulder region       Relevant Orders   DG Shoulder Left (Completed)      I have discontinued Lorie S. Veras "Al"'s Neomy-Bacit-Polymyx-Pramoxine (TRIPLE ANTIBIOTIC PAIN RELIEF EX). I am also having him start on hydrochlorothiazide. Additionally, I am having him maintain his aspirin-acetaminophen-caffeine, loratadine, Menthol (ROBITUSSIN COUGH DROPS MT), Guaifenesin, Glucosamine-Chondroitin, calcium elemental as carbonate, multivitamin with minerals, Capsaicin-Menthol (SALONPAS GEL EX), (Menthol, Topical Analgesic, (BLUE-EMU MAXIMUM STRENGTH EX)), fluticasone, meloxicam, Trelegy Ellipta, amLODipine, albuterol, montelukast, imiquimod, and mupirocin ointment.  Meds ordered this encounter  Medications  . imiquimod (ALDARA) 5 % cream    Sig: Apply topically 3 (three) times a week.    Dispense:  12 each    Refill:  1  . mupirocin ointment (BACTROBAN) 2 %    Sig: Apply 1 application topically 2 (two)  times daily.    Dispense:  22 g    Refill:  1  . hydrochlorothiazide (MICROZIDE) 12.5 MG capsule    Sig: Take 1 capsule (12.5 mg total) by mouth daily.    Dispense:  30 capsule    Refill:  2     Penni Homans, MD

## 2019-12-26 NOTE — Assessment & Plan Note (Signed)
Encouraged heart healthy diet, increase exercise, avoid trans fats, consider a krill oil cap daily 

## 2019-12-26 NOTE — Assessment & Plan Note (Signed)
Has worsening diffuse pain, ANA and RF negative. Stay as active as able

## 2019-12-31 ENCOUNTER — Ambulatory Visit (HOSPITAL_BASED_OUTPATIENT_CLINIC_OR_DEPARTMENT_OTHER)
Admission: RE | Admit: 2019-12-31 | Discharge: 2019-12-31 | Disposition: A | Discharge status: Home / Self Care | Payer: Medicare HMO | Source: Ambulatory Visit | Attending: Family Medicine | Admitting: Family Medicine

## 2019-12-31 ENCOUNTER — Other Ambulatory Visit: Payer: Self-pay

## 2019-12-31 DIAGNOSIS — M7989 Other specified soft tissue disorders: Secondary | ICD-10-CM | POA: Diagnosis not present

## 2019-12-31 DIAGNOSIS — R2232 Localized swelling, mass and lump, left upper limb: Secondary | ICD-10-CM | POA: Diagnosis not present

## 2020-01-14 ENCOUNTER — Other Ambulatory Visit: Payer: Self-pay | Admitting: Family Medicine

## 2020-01-16 ENCOUNTER — Encounter: Payer: Self-pay | Admitting: Family Medicine

## 2020-02-24 ENCOUNTER — Other Ambulatory Visit: Payer: Self-pay | Admitting: *Deleted

## 2020-02-24 MED ORDER — MELOXICAM 15 MG PO TABS: 15.0000 mg | ORAL_TABLET | Freq: Every day | ORAL | 0 refills | Status: DC | PRN

## 2020-02-25 NOTE — Progress Notes (Signed)
I connected with Al today by telephone and verified that I am speaking with the correct person using two identifiers. Location patient: home Location provider: work Persons participating in the virtual visit: patient, Marine scientist.    I discussed the limitations, risks, security and privacy concerns of performing an evaluation and management service by telephone and the availability of in person appointments. I also discussed with the patient that there may be a patient responsible charge related to this service. The patient expressed understanding and verbally consented to this telephonic visit.    Interactive audio and video telecommunications were attempted between this provider and patient, however failed, due to patient having technical difficulties OR patient did not have access to video capability.  We continued and completed visit with audio only.  Some vital signs may be absent or patient reported.    Subjective:   [name] is a 74 y.o. male who presents for Medicare Annual/Subsequent preventive examination.  Review of Systems     Cardiac Risk Factors include: advanced age (>68men, >41 women);diabetes mellitus;dyslipidemia;hypertension;male gender     Objective:    Today's Vitals   02/28/20 0933  BP: 124/76  Pulse: 73  Temp: 97.9 F (36.6 C)  SpO2: 98%  Weight: 149 lb (67.6 kg)   Body mass index is 23.34 kg/m.  Advanced Directives 02/28/2020 09/30/2016 09/28/2016 09/10/2016 05/25/2015  Does Patient Have a Medical Advance Directive? Yes - Yes Yes Yes  Type of Paramedic of [place];Living will Healthcare Power of [place];Living will  Does patient want to make changes to medical advance directive? No - Patient declined - - - -  Copy of [place] in Chart? Yes - validated most recent copy scanned in chart (See row information) - - No - copy requested -    Current  Medications (verified) Outpatient Encounter Medications as of 02/28/2020  Medication Sig  . albuterol (VENTOLIN HFA) 108 (90 Base) MCG/ACT inhaler Inhale 2 puffs into the lungs every 6 (six) hours as needed for wheezing or shortness of breath.  Marland Kitchen amLODipine (NORVASC) 5 MG tablet TAKE 1 TABLET EVERY DAY  . aspirin-acetaminophen-caffeine (EXCEDRIN MIGRAINE) 250-250-65 MG per tablet Take 2 tablets by mouth every 6 (six) hours as needed (arthritis pain).   . calcium elemental as carbonate (TUMS ULTRA 1000) 400 MG chewable tablet Chew 1,000 mg by mouth 2 (two) times daily.  . Capsaicin-Menthol (SALONPAS GEL EX) Apply 1 application topically daily as needed (arthritis pain).  . fluticasone (FLONASE) 50 MCG/ACT nasal spray USE 2 SPRAYS IN EACH NOSTRIL EVERY DAY AS NEEDED FOR ALLERGIES OR RHINITIS  . Fluticasone-Umeclidin-Vilant (TRELEGY ELLIPTA) 100-62.5-25 MCG/INH AEPB Inhale 1 puff into the lungs daily.  . Glucosamine-Chondroitin 750-600 MG TABS Take 1 tablet by mouth 2 (two) times daily.  . Guaifenesin (MUCINEX MAXIMUM STRENGTH) 1200 MG TB12 Take 600 mg by mouth 2 (two) times daily.  . hydrochlorothiazide (MICROZIDE) 12.5 MG capsule Take 1 capsule (12.5 mg total) by mouth daily.  . imiquimod (ALDARA) 5 % cream Apply topically 3 (three) times a week.  . loratadine (CLARITIN) 10 MG tablet Take 10 mg by mouth daily.  . meloxicam (MOBIC) 15 MG tablet Take 1 tablet (15 mg total) by mouth daily as needed for pain.  . Menthol (ROBITUSSIN COUGH DROPS MT) Use as directed 1 lozenge in the mouth or throat 4 (four) times daily as needed (cough).   . Menthol, Topical Analgesic, (BLUE-EMU MAXIMUM STRENGTH  EX) Apply 1 application topically daily as needed (arthritis pain).  . montelukast (SINGULAIR) 10 MG tablet TAKE 1 TABLET (10 MG TOTAL) BY MOUTH DAILY.  . Multiple Vitamin (MULTIVITAMIN WITH MINERALS) TABS tablet Take 1 tablet by mouth on Monday, Wednesday, Friday with supper   . mupirocin ointment (BACTROBAN) 2 %  Apply 1 application topically 2 (two) times daily.   No facility-administered encounter medications on file as of 02/28/2020.    Allergies (verified) Patient has no known allergies.   History: Past Medical History:  Diagnosis Date  . Allergic rhinitis   . Atypical chest pain 12/01/2014  . Back pain 01/12/2013  . COPD (chronic obstructive pulmonary disease) (HCC)    FeV1 64%-2007  . Emphysema   . GERD (gastroesophageal reflux disease)   . History of lung abscess    bronchiectasis with RMLandRLL ersection -1996- Dr Arlyce Dice  . Hordeolum externum (stye) 04/23/2015   Right eye  . Hypertension   . Impaired vision    glasses  . Osteoarthritis    hands and knees  . Sinusitis, acute 04/23/2015   Past Surgical History:  Procedure Laterality Date  . HERNIA REPAIR  10/2009  . left knee    . lower back surgery  09/2008   06/2009  . LUNG SURGERY     RML andRUL removed due to bleeding and bronchiectasis and lung abcess  . NECK SURGERY  05/2008  . right foot surgery     Family History  Problem Relation Age of Onset  . Prostate cancer Father        father died prostate ca  . Coronary artery disease Other        1st degree relative<60  . Stroke Other        1st degree relative<50  . Heart failure Mother        age 59  . Colon cancer Paternal Grandmother   . Obstructive Sleep Apnea Brother   . Obesity Brother    Social History   Socioeconomic History  . Marital status: Married    Spouse name: Not on file  . Number of children: Not on file  . Years of education: Not on file  . Highest education level: Not on file  Occupational History  . Occupation: retired Music therapist  Tobacco Use  . Smoking status: Former Smoker    Packs/day: 1.50    Years: 38.00    Pack years: 57.00    Types: Cigarettes    Quit date: 09/03/2000    Years since quitting: 19.4  . Smokeless tobacco: Never Used  Substance and Sexual Activity  . Alcohol use: Yes    Alcohol/week: 2.0 - 3.0 standard  drinks    Types: 2 - 3 Cans of beer per week  . Drug use: No  . Sexual activity: Never  Other Topics Concern  . Not on file  Social History Narrative   Retired Music therapist   Patient states former smoker. 1 1/2 ppd x 38 yrs  Quit in Jan 2002   Married - 2 weeks (4th marriage)   divorced,  remarried 84-2004 (lost wife to lung ca),  remarried (divorced),    1 son  - 51 [name])   Alcohol use-yes (2-3 beers per week)           Social Determinants of Health   Financial Resource Strain:   . Difficulty of Paying Living Expenses:   Food Insecurity:   . Worried About Charity fundraiser in the  Last Year:   . [place] in the Last Year:   Transportation Needs:   . Film/video editor (Medical):   Marland Kitchen Lack of Transportation (Non-Medical):   Physical Activity:   . Days of Exercise per Week:   . Minutes of Exercise per Session:   Stress:   . Feeling of Stress :   Social Connections:   . Frequency of Communication with Friends and Family:   . Frequency of Social Gatherings with Friends and Family:   . Attends Religious Services:   . Active Member of Clubs or Organizations:   . Attends Archivist Meetings:   Marland Kitchen Marital Status:     Tobacco Counseling Counseling given: Not Answered   Clinical Intake: Pain : No/denies pain    Activities of Daily Living In your present state of health, do you have any difficulty performing the following activities: 02/28/2020  Hearing? N  Vision? N  Difficulty concentrating or making decisions? N  Walking or climbing stairs? N  Dressing or bathing? N  Doing errands, shopping? N  Preparing Food and eating ? N  Using the Toilet? N  In the past six months, have you accidently leaked urine? N  Do you have problems with loss of bowel control? N  Managing your Medications? N  Managing your Finances? N  Housekeeping or managing your Housekeeping? N  Some recent data might be hidden    Patient Care Team: Mosie Lukes, MD as PCP - General (Family Medicine) Josue Hector, MD as PCP - Cardiology (Cardiology) Elsie Stain, MD as Attending Physician (Pulmonary Disease)  Indicate any recent Medical Services you may have received from other than [facility] providers in the past year (date may be approximate).     Assessment:   This is a routine wellness examination for Jacarri.   Dietary issues and exercise activities discussed: Current Exercise Habits: Home exercise routine, Type of exercise: walking, Time (Minutes): 30, Frequency (Times/Week): 7, Weekly Exercise (Minutes/Week): 210, Intensity: Mild, Exercise limited by: None identified   Diet (meal preparation, eat out, water intake, caffeinated beverages, dairy products, fruits and vegetables): well balanced   Goals    . maintain healthy lifestyle.      Depression Screen PHQ 2/9 Scores 12/27/2019 09/15/2017 09/10/2016 11/07/2014 07/22/2014  PHQ - 2 Score 0 0 0 0 0  PHQ- 9 Score 0 - - - -    Fall Risk Fall Risk  02/28/2020 09/15/2017 09/10/2016 11/07/2014 07/22/2014  Falls in the past year? 0 No Yes Yes No  Comment - - when getting out of kayak - -  Number falls in past yr: 0 - 1 1 -  Injury with Fall? 0 - No No -  Follow up Education provided;Falls prevention discussed - Falls prevention discussed;Education provided - -   Lives with wife in 1.5 story home.  Any stairs in or around the home? Yes  If so, are there any without handrails? No  Home free of loose throw rugs in walkways, pet beds, electrical cords, etc? Yes  Adequate lighting in your home to reduce risk of falls? Yes   ASSISTIVE DEVICES UTILIZED TO PREVENT FALLS:  Life alert? No  Use of a cane, walker or w/c? No  Grab bars in the bathroom? Yes  Shower chair or bench in shower? Yes  Elevated toilet seat or a handicapped toilet? No    Cognitive Function: Ad8 score reviewed for issues:  Issues making decisions:no  Less interest in hobbies /  activities:no  Repeats questions,  stories (family complaining):no  Trouble using ordinary gadgets (microwave, computer, phone):no  Forgets the month or year: no  Mismanaging finances: no  Remembering appts:no  Daily problems with thinking and/or memory:no Ad8 score is=0     MMSE - Mini Mental State Exam 09/10/2016  Orientation to time 5  Orientation to Place 5  Registration 3  Attention/ Calculation 5  Recall 3  Language- name 2 objects 2  Language- repeat 1  Language- follow 3 step command 3  Language- read & follow direction 1  Write a sentence 1  Copy design 1  Total score 30        Immunizations Immunization History  Administered Date(s) Administered  . Influenza Split 06/02/2012  . Influenza Whole 05/15/2010, 05/02/2011, 05/18/2013  . Influenza, High Dose Seasonal PF 04/20/2017, 05/05/2018, 04/30/2019  . Influenza,inj,Quad PF,6+ Mos 05/14/2014, 05/25/2015  . Influenza-Unspecified 05/15/2016  . PFIZER SARS-COV-2 Vaccination 09/27/2019, 10/18/2019  . Pneumococcal Conjugate-13 05/20/2013, 05/15/2016  . Pneumococcal Polysaccharide-23 07/03/2001, 05/02/2011, 05/20/2018  . Td 05/15/2004  . Tdap 02/17/2014  . Zoster 07/16/2011    TDAP status: Up to date Flu Vaccine status: Up to date Pneumococcal vaccine status: Up to date Covid-19 vaccine status: Completed vaccines  Qualifies for Shingles Vaccine?   Zostavax completed Yes     Screening Tests Health Maintenance  Topic Date Due  . FOOT EXAM  Never done  . OPHTHALMOLOGY EXAM  Never done  . URINE MICROALBUMIN  Never done  . INFLUENZA VACCINE  04/02/2020  . HEMOGLOBIN A1C  06/23/2020  . COLONOSCOPY  11/19/2020  . TETANUS/TDAP  02/18/2024  . COVID-19 Vaccine  Completed  . Hepatitis C Screening  Completed  . PNA vac Low Risk Adult  Completed    Health Maintenance  Health Maintenance Due  Topic Date Due  . FOOT EXAM  Never done  . OPHTHALMOLOGY EXAM  Never done  . URINE MICROALBUMIN  Never done    Colorectal cancer screening:  Completed 11/26/10. Repeat every 10 years   Lung Cancer Screening: (Low Dose CT Chest recommended if Age 40-80 years, 30 pack-year currently smoking OR have quit w/in 15years.) does not qualify.    Additional Screening:  Hepatitis C Screening: Completed 07/26/15  Vision Screening: Recommended annual ophthalmology exams for early detection of glaucoma and other disorders of the eye. Is the patient up to date with their annual eye exam?  Yes  per pt   Dental Screening: Recommended annual dental exams for proper oral hygiene  Community Resource Referral / Chronic Care Management: CRR required this visit?  No   CCM required this visit?  No      Plan:     I have personally reviewed and noted the following in the patient's chart:   . Medical and social history . Use of alcohol, tobacco or illicit drugs  . Current medications and supplements . Functional ability and status . Nutritional status . Physical activity . Advanced directives . List of other physicians . Hospitalizations, surgeries, and ER visits in previous 12 months . Vitals . Screenings to include cognitive, depression, and falls . Referrals and appointments  In addition, I have reviewed and discussed with patient certain preventive protocols, quality metrics, and best practice recommendations. A written personalized care plan for preventive services as well as general preventive health recommendations were provided to patient.   Due to this being a telephonic visit, the after visit summary with patients personalized plan was offered to patient via mail or my-chart.  Patient would like to access on my-chart.  Naaman Plummer [place], [place]   02/28/2020

## 2020-02-28 ENCOUNTER — Other Ambulatory Visit: Payer: Self-pay

## 2020-02-28 ENCOUNTER — Encounter: Payer: Self-pay | Admitting: *Deleted

## 2020-02-28 ENCOUNTER — Ambulatory Visit (INDEPENDENT_AMBULATORY_CARE_PROVIDER_SITE_OTHER): Payer: Medicare HMO | Admitting: *Deleted

## 2020-02-28 VITALS — BP 124/76 | HR 73 | Temp 97.9°F | Wt 149.0 lb

## 2020-02-28 DIAGNOSIS — Z0000 Encounter for general adult medical examination without abnormal findings: Secondary | ICD-10-CM | POA: Diagnosis not present

## 2020-02-28 NOTE — Patient Instructions (Signed)
[name] , Thank you for taking time to come for your Medicare Wellness Visit. I appreciate your ongoing commitment to your health goals. Please review the following plan we discussed and let me know if I can assist you in the future.   Screening recommendations/referrals: Colorectal cancer screening: Completed 11/26/10. Repeat every 10 years  Recommended yearly ophthalmology/optometry visit for glaucoma screening and checkup Recommended yearly dental visit for hygiene and checkup  Vaccinations: TDAP status: Up to date Flu Vaccine status: Up to date Pneumococcal vaccine status: Up to date Covid-19 vaccine status: Completed vaccines  Advanced directives: on file   Next appointment: Follow up in one year for your annual wellness visit    Preventive Care 73 Years and Older, Male Preventive care refers to lifestyle choices and visits with your health care provider that can promote health and wellness. This includes:  A yearly physical exam. This is also called an annual well check.  Regular dental and eye exams.  Immunizations.  Screening for certain conditions.  Healthy lifestyle choices, such as diet and exercise. What can I expect for my preventive care visit? Physical exam Your health care provider will check:  Height and weight. These may be used to calculate body mass index (BMI), which is a measurement that tells if you are at a healthy weight.  Heart rate and blood pressure.  Your skin for abnormal spots. Counseling Your health care provider may ask you questions about:  Alcohol, tobacco, and drug use.  Emotional well-being.  Home and relationship well-being.  Sexual activity.  Eating habits.  History of falls.  Memory and ability to understand (cognition).  Work and work Statistician. What immunizations do I need?  Influenza (flu) vaccine  This is recommended every year. Tetanus, diphtheria, and pertussis (Tdap) vaccine  You may need a Td booster  every 10 years. Varicella (chickenpox) vaccine  You may need this vaccine if you have not already been vaccinated. Zoster (shingles) vaccine  You may need this after age 2. Pneumococcal conjugate (PCV13) vaccine  One dose is recommended after age 73. Pneumococcal polysaccharide (PPSV23) vaccine  One dose is recommended after age 73. Measles, mumps, and rubella (MMR) vaccine  You may need at least one dose of MMR if you were born in 1957 or later. You may also need a second dose. Meningococcal conjugate (MenACWY) vaccine  You may need this if you have certain conditions. Hepatitis A vaccine  You may need this if you have certain conditions or if you travel or work in places where you may be exposed to hepatitis A. Hepatitis B vaccine  You may need this if you have certain conditions or if you travel or work in places where you may be exposed to hepatitis B. Haemophilus influenzae type b (Hib) vaccine  You may need this if you have certain conditions. You may receive vaccines as individual doses or as more than one vaccine together in one shot (combination vaccines). Talk with your health care provider about the risks and benefits of combination vaccines. What tests do I need? Blood tests  Lipid and cholesterol levels. These may be checked every 5 years, or more frequently depending on your overall health.  Hepatitis C test.  Hepatitis B test. Screening  Lung cancer screening. You may have this screening every year starting at age 73 if you have a 30-pack-year history of smoking and currently smoke or have quit within the past 15 years.  Colorectal cancer screening. All adults should have this screening  starting at age 73 and continuing until age 24. Your health care provider may recommend screening at age 63 if you are at increased risk. You will have tests every 1-10 years, depending on your results and the type of screening test.  Prostate cancer screening.  Recommendations will vary depending on your family history and other risks.  Diabetes screening. This is done by checking your blood sugar (glucose) after you have not eaten for a while (fasting). You may have this done every 1-3 years.  Abdominal aortic aneurysm (AAA) screening. You may need this if you are a current or former smoker.  Sexually transmitted disease (STD) testing. Follow these instructions at home: Eating and drinking  Eat a diet that includes fresh fruits and vegetables, whole grains, lean protein, and low-fat dairy products. Limit your intake of foods with high amounts of sugar, saturated fats, and salt.  Take vitamin and mineral supplements as recommended by your health care provider.  Do not drink alcohol if your health care provider tells you not to drink.  If you drink alcohol: ? Limit how much you have to 0-2 drinks a day. ? Be aware of how much alcohol is in your drink. In the [place] one drink equals one 12 oz bottle of beer (355 mL), one 5 oz glass of wine (148 mL), or one 1 oz glass of hard liquor (44 mL). Lifestyle  Take daily care of your teeth and gums.  Stay active. Exercise for at least 30 minutes on 5 or more days each week.  Do not use any products that contain nicotine or tobacco, such as cigarettes, e-cigarettes, and chewing tobacco. If you need help quitting, ask your health care provider.  If you are sexually active, practice safe sex. Use a condom or other form of protection to prevent STIs (sexually transmitted infections).  Talk with your health care provider about taking a low-dose aspirin or statin. What's next?  Visit your health care provider once a year for a well check visit.  Ask your health care provider how often you should have your eyes and teeth checked.  Stay up to date on all vaccines. This information is not intended to replace advice given to you by your health care provider. Make sure you discuss any questions you have with  your health care provider. Document Revised: 08/13/2018 Document Reviewed: 08/13/2018 Elsevier Patient Education  2020 [employer].

## 2020-03-03 ENCOUNTER — Other Ambulatory Visit: Payer: Self-pay

## 2020-03-03 MED ORDER — MELOXICAM 15 MG PO TABS: 15.0000 mg | ORAL_TABLET | Freq: Every day | ORAL | 1 refills | Status: DC | PRN

## 2020-03-03 NOTE — Telephone Encounter (Signed)
Received refill request via fax for meolxicam 15 mg tablet. Filled on 02/24/2020 #15. Please advise if refill is appropriate.

## 2020-03-07 ENCOUNTER — Encounter: Payer: Self-pay | Admitting: Family Medicine

## 2020-03-08 ENCOUNTER — Other Ambulatory Visit: Payer: Self-pay | Admitting: *Deleted

## 2020-03-08 DIAGNOSIS — I10 Essential (primary) hypertension: Secondary | ICD-10-CM

## 2020-03-08 MED ORDER — LOSARTAN POTASSIUM 25 MG PO TABS: 25.0000 mg | ORAL_TABLET | Freq: Every day | ORAL | 0 refills | Status: DC

## 2020-03-09 DIAGNOSIS — L821 Other seborrheic keratosis: Secondary | ICD-10-CM | POA: Diagnosis not present

## 2020-03-09 DIAGNOSIS — L578 Other skin changes due to chronic exposure to nonionizing radiation: Secondary | ICD-10-CM | POA: Diagnosis not present

## 2020-03-09 DIAGNOSIS — L570 Actinic keratosis: Secondary | ICD-10-CM | POA: Diagnosis not present

## 2020-03-09 DIAGNOSIS — W908XXS Exposure to other nonionizing radiation, sequela: Secondary | ICD-10-CM | POA: Diagnosis not present

## 2020-03-09 DIAGNOSIS — L249 Irritant contact dermatitis, unspecified cause: Secondary | ICD-10-CM | POA: Diagnosis not present

## 2020-03-09 DIAGNOSIS — D229 Melanocytic nevi, unspecified: Secondary | ICD-10-CM | POA: Diagnosis not present

## 2020-03-09 DIAGNOSIS — D692 Other nonthrombocytopenic purpura: Secondary | ICD-10-CM | POA: Diagnosis not present

## 2020-03-11 ENCOUNTER — Encounter: Payer: Self-pay | Admitting: Family Medicine

## 2020-03-12 ENCOUNTER — Encounter: Payer: Self-pay | Admitting: Family Medicine

## 2020-03-13 ENCOUNTER — Encounter: Payer: Self-pay | Admitting: Family Medicine

## 2020-03-20 ENCOUNTER — Encounter: Payer: Self-pay | Admitting: Family Medicine

## 2020-03-21 ENCOUNTER — Encounter: Payer: Self-pay | Admitting: Family Medicine

## 2020-03-22 ENCOUNTER — Other Ambulatory Visit: Payer: Self-pay | Admitting: Family Medicine

## 2020-03-22 ENCOUNTER — Other Ambulatory Visit: Payer: Self-pay | Admitting: Pulmonary Disease

## 2020-03-29 ENCOUNTER — Other Ambulatory Visit: Payer: Self-pay

## 2020-03-29 ENCOUNTER — Ambulatory Visit (INDEPENDENT_AMBULATORY_CARE_PROVIDER_SITE_OTHER): Payer: Medicare HMO | Admitting: Family Medicine

## 2020-03-29 ENCOUNTER — Other Ambulatory Visit (INDEPENDENT_AMBULATORY_CARE_PROVIDER_SITE_OTHER): Payer: Medicare HMO

## 2020-03-29 DIAGNOSIS — I10 Essential (primary) hypertension: Secondary | ICD-10-CM | POA: Diagnosis not present

## 2020-03-29 LAB — COMPREHENSIVE METABOLIC PANEL
ALT: 15 U/L (ref 0–53)
AST: 22 U/L (ref 0–37)
Albumin: 3.7 g/dL (ref 3.5–5.2)
Alkaline Phosphatase: 65 U/L (ref 39–117)
BUN: 21 mg/dL (ref 6–23)
CO2: 27 mEq/L (ref 19–32)
Calcium: 9.2 mg/dL (ref 8.4–10.5)
Chloride: 105 mEq/L (ref 96–112)
Creatinine, Ser: 0.76 mg/dL (ref 0.40–1.50)
GFR: 100.43 mL/min (ref >60.00)
Glucose, Bld: 81 mg/dL (ref 70–99)
Potassium: 3.9 mEq/L (ref 3.5–5.1)
Sodium: 138 mEq/L (ref 135–145)
Total Bilirubin: 0.9 mg/dL (ref 0.2–1.2)
Total Protein: 6.8 g/dL (ref 6.0–8.3)

## 2020-03-29 NOTE — Progress Notes (Addendum)
Pt here for Blood pressure check per Dr. Charlett Blake  Pt currently takes: amlodipine 5 mg   Pt reports compliance with medication.  BP today @ =148/80 HR = 67  Please advise and I will call patient. Nursing note reviewed. Agree with documention and plan.

## 2020-05-02 ENCOUNTER — Encounter: Payer: Self-pay | Admitting: Family Medicine

## 2020-05-03 ENCOUNTER — Telehealth (INDEPENDENT_AMBULATORY_CARE_PROVIDER_SITE_OTHER): Payer: Medicare HMO | Admitting: Family Medicine

## 2020-05-03 ENCOUNTER — Encounter: Payer: Self-pay | Admitting: Family Medicine

## 2020-05-03 ENCOUNTER — Other Ambulatory Visit: Payer: Self-pay

## 2020-05-03 VITALS — HR 73 | Temp 97.8°F | Ht 67.0 in | Wt 148.0 lb

## 2020-05-03 DIAGNOSIS — J440 Chronic obstructive pulmonary disease with acute lower respiratory infection: Secondary | ICD-10-CM | POA: Diagnosis not present

## 2020-05-03 DIAGNOSIS — J209 Acute bronchitis, unspecified: Secondary | ICD-10-CM | POA: Diagnosis not present

## 2020-05-03 MED ORDER — PREDNISONE 10 MG PO TABS: ORAL_TABLET | ORAL | 0 refills | Status: DC

## 2020-05-03 MED ORDER — AZITHROMYCIN 250 MG PO TABS: ORAL_TABLET | ORAL | 0 refills | Status: DC

## 2020-05-03 NOTE — Progress Notes (Signed)
Virtual Visit via Video Note  I connected with [name] on 05/03/20 at  8:40 AM EDT by a video enabled telemedicine application and verified that I am speaking with the correct person using two identifiers.  Location/persons involved Patient: home alone  Provider: home    I discussed the limitations of evaluation and management by telemedicine and the availability of in person appointments. The patient expressed understanding and agreed to proceed.  History of Present Illness: Pt is home c/o cough / bronchitis x 2-3 weeks ,   No fever.  + wheezing and productive cough.  Hx of lung abscess with lobectomy in 1996   no sob  Observations/Objective: Vitals:   05/03/20 0840  Pulse: 73  Temp: 97.8 F (36.6 C)  SpO2: 98%  pt is in nad  Assessment and Plan: 1. Acute bronchitis with COPD ([place]) z pak and pred taper Pt has albuterol inh at home  He has had covid vaccine If he does not improve----  He will get covid test  F/u prn  - azithromycin (ZITHROMAX Z-PAK) 250 MG tablet; As directed  Dispense: 6 each; Refill: 0 - predniSONE (DELTASONE) 10 MG tablet; TAKE 3 TABLETS PO QD FOR 3 DAYS THEN TAKE 2 TABLETS PO QD FOR 3 DAYS THEN TAKE 1 TABLET PO QD FOR 3 DAYS THEN TAKE 1/2 TAB PO QD FOR 3 DAYS  Dispense: 20 tablet; Refill: 0  Follow Up Instructions:    I discussed the assessment and treatment plan with the patient. The patient was provided an opportunity to ask questions and all were answered. The patient agreed with the plan and demonstrated an understanding of the instructions.   The patient was advised to call back or seek an in-person evaluation if the symptoms worsen or if the condition fails to improve as anticipated.  I provided 25 minutes of non-face-to-face time during this encounter.   Ann Held, DO

## 2020-05-08 ENCOUNTER — Other Ambulatory Visit: Payer: Self-pay | Admitting: Family Medicine

## 2020-05-10 ENCOUNTER — Telehealth: Payer: Self-pay

## 2020-05-10 NOTE — Telephone Encounter (Signed)
Caller states he received and email from the pharmacy letting him know he had a prescription to pick up for Losartan. He said his Deloris Ping shows he had an office visit on the 6th to get the medication refilled but he was not seen on the 6th and in fact he has a bad allergy to the medication. Caller wants to know what is going on and to get his mychart fixed.  Telephone: [phone]

## 2020-05-10 NOTE — Telephone Encounter (Signed)
Had discontinued Losartan, due to allergies. Patient was notified.

## 2020-05-15 ENCOUNTER — Telehealth: Payer: Self-pay | Admitting: Pulmonary Disease

## 2020-05-15 NOTE — Telephone Encounter (Signed)
Spoke with patient regarding prior message. Patient stated he did cough up a very light tint of blood . Per patient made a appt with Sela Hilding on 9/20 at 10:30am . Patient wanted to be seen before he has CT scan in October. Patient was advised if was  He was coughing up more blood to contact our office . Patient's voice was understanding. Northing else further needed.

## 2020-05-22 ENCOUNTER — Telehealth: Payer: Self-pay | Admitting: Acute Care

## 2020-05-22 ENCOUNTER — Ambulatory Visit: Payer: Medicare HMO | Admitting: Acute Care

## 2020-05-22 ENCOUNTER — Other Ambulatory Visit: Payer: Self-pay

## 2020-05-22 ENCOUNTER — Ambulatory Visit (HOSPITAL_BASED_OUTPATIENT_CLINIC_OR_DEPARTMENT_OTHER)
Admission: RE | Admit: 2020-05-22 | Discharge: 2020-05-22 | Disposition: A | Discharge status: Home / Self Care | Payer: Medicare HMO | Source: Ambulatory Visit | Attending: Acute Care | Admitting: Acute Care

## 2020-05-22 ENCOUNTER — Encounter: Payer: Self-pay | Admitting: Acute Care

## 2020-05-22 ENCOUNTER — Other Ambulatory Visit (INDEPENDENT_AMBULATORY_CARE_PROVIDER_SITE_OTHER): Payer: Medicare HMO

## 2020-05-22 VITALS — BP 122/80 | HR 91 | Temp 98.0°F | Ht 67.0 in | Wt 148.2 lb

## 2020-05-22 DIAGNOSIS — R079 Chest pain, unspecified: Secondary | ICD-10-CM | POA: Insufficient documentation

## 2020-05-22 DIAGNOSIS — J449 Chronic obstructive pulmonary disease, unspecified: Secondary | ICD-10-CM

## 2020-05-22 DIAGNOSIS — R059 Cough, unspecified: Secondary | ICD-10-CM

## 2020-05-22 DIAGNOSIS — R042 Hemoptysis: Secondary | ICD-10-CM

## 2020-05-22 DIAGNOSIS — R05 Cough: Secondary | ICD-10-CM | POA: Diagnosis not present

## 2020-05-22 LAB — BASIC METABOLIC PANEL
BUN: 22 mg/dL (ref 6–23)
CO2: 25 mEq/L (ref 19–32)
Calcium: 9.4 mg/dL (ref 8.4–10.5)
Chloride: 104 mEq/L (ref 96–112)
Creatinine, Ser: 0.76 mg/dL (ref 0.40–1.50)
GFR: 100.39 mL/min (ref >60.00)
Glucose, Bld: 85 mg/dL (ref 70–99)
Potassium: 4.3 mEq/L (ref 3.5–5.1)
Sodium: 137 mEq/L (ref 135–145)

## 2020-05-22 MED ORDER — IOHEXOL 350 MG/ML SOLN
100.0000 mL | Freq: Once | INTRAVENOUS | Status: AC | PRN
  Administered 2020-05-22: 100 mL via INTRAVENOUS

## 2020-05-22 NOTE — Progress Notes (Addendum)
History of Present Illness [name] is a 74 y.o. male former with COPD ( Gold C) and LLL Lung nodule abscess 1996 with RML resection. Quit smoking 2002 with a 57 pack year smoking history. [name] is followed by Dr. Elsworth Soho.  74 yo smoker with COPD , gold C and left lower lobe lung nodule Lung abscess in 1996 s/p RML resection    quit smoking in 2002   05/22/2020  Pt. Presents for follow up. Pt states [name] has had a cough with  bronchitis flare 05/03/2020. This was treated by his PCP with a z pack and pred taper. [name] states states his cough got a bit better, but never resolved.[name] has been having blood tinged secretions for the last  3-4 weeks. [name] has not had any weight loss. [name] states the last time [name] saw blood in his sputum, [name] had half his right lung removed.  [name] states [name] is using Mucinex twice daily, and [name] does have some chest congestion. [name] is compliant with his Trelegy daily. [name] is using his rescue inhaler once daily before [name] uses his Trelegy. [name] does not have to use it for breakthrough shortness of breath. No fever, or chest pain. [name] is tachycardic, and coughing. [name] has baseline dyspnea because of his COPD.   Test Results: 9/20/2021CTA Chest Lungs/Pleura: Postoperative changes on the right from prior partial resection. Probable scarring peripherally in the remaining right lung superiorly, similar to prior study. There is now an air-filled cystic area within the peripheral right upper lung measuring up to 2.3 cm. It is unclear if this represents a small peripheral bleb or pneumatocele. No effusion. Left lung clear.  Upper Abdomen: Imaging into the upper abdomen demonstrates no acute findings.  Musculoskeletal: Chest wall soft tissues are unremarkable. No acute bony abnormality.  Review of the MIP images confirms the above findings.  IMPRESSION: No evidence of pulmonary embolus.  Postoperative changes in the right lung with areas of scarring peripherally. Air-filled cystic  area noted peripherally in the right upper lung measuring 2.3 cm. This could reflect small to pneumatocele  or peripheral bleb.  No acute cardiopulmonary disease.  2014 -Spirometry FEV1 53%, ratio 54  CT chest 09/2018 slight decrease in left lower lobe pulmonary nodule from 7 mm to 5 mm compared to 06/2018  CBC Latest Ref Rng & Units 12/23/2019 06/02/2019 09/28/2018  WBC 4.0 - 10.5 K/uL 4.4 6.3 6.1  Hemoglobin 13.0 - 17.0 g/dL 14.2 14.1 14.3  Hematocrit 39 - 52 % 42.1 42.7 42.8  Platelets 150 - 400 K/uL 259.0 271.0 244.0    BMP Latest Ref Rng & Units 03/29/2020 12/23/2019 06/02/2019  Glucose 70 - 99 mg/dL 81 150(H) 99  BUN 6 - 23 mg/dL 21 20 19   Creatinine 0.40 - 1.50 mg/dL 0.76 0.75 0.81  Sodium 135 - 145 mEq/L 138 139 140  Potassium 3.5 - 5.1 mEq/L 3.9 4.0 4.2  Chloride 96 - 112 mEq/L 105 105 104  CO2 19 - 32 mEq/L 27 27 26   Calcium 8.4 - 10.5 mg/dL 9.2 9.0 9.4    BNP No results found for: BNP  ProBNP    Component Value Date/Time   PROBNP 18.0 07/20/2013 1403    PFT    Component Value Date/Time   FEV1PRE 1.72 07/06/2018 0829   FEV1POST 1.79 07/06/2018 0829   FVCPRE 2.74 07/06/2018 0829   FVCPOST 2.79 07/06/2018 0829   TLC 4.86 07/06/2018 0829   DLCOUNC 15.04 07/06/2018 0829   PREFEV1FVCRT 63 07/06/2018  5621   PSTFEV1FVCRT 64 07/06/2018 0829    No results found.   Past medical hx Past Medical History:  Diagnosis Date  . Allergic rhinitis   . Atypical chest pain 12/01/2014  . Back pain 01/12/2013  . COPD (chronic obstructive pulmonary disease) (HCC)    FeV1 64%-2007  . Emphysema   . GERD (gastroesophageal reflux disease)   . History of lung abscess    bronchiectasis with RMLandRLL ersection -1996- Dr Arlyce Dice  . Hordeolum externum (stye) 04/23/2015   Right eye  . Hypertension   . Impaired vision    glasses  . Osteoarthritis    hands and knees  . Sinusitis, acute 04/23/2015     Social History   Tobacco Use  . Smoking status: Former Smoker     Packs/day: 1.50    Years: 38.00    Pack years: 57.00    Types: Cigarettes    Quit date: 09/03/2000    Years since quitting: 19.7  . Smokeless tobacco: Never Used  Substance Use Topics  . Alcohol use: Yes    Alcohol/week: 2.0 - 3.0 standard drinks    Types: 2 - 3 Cans of beer per week  . Drug use: No    [name] reports that [name] quit smoking about 19 years ago. His smoking use included cigarettes. [name] has a 57.00 pack-year smoking history. [name] has never used smokeless tobacco. [name] reports current alcohol use of about 2.0 - 3.0 standard drinks of alcohol per week. [name] reports that [name] does not use drugs.  Tobacco Cessation: 57 pack year smoking history, Quit 2002  Past surgical hx, Family hx, Social hx all reviewed.  Current Outpatient Medications on File Prior to Visit  Medication Sig  . albuterol (VENTOLIN HFA) 108 (90 Base) MCG/ACT inhaler INHALE 2 PUFFS INTO THE LUNGS EVERY 6 (SIX) HOURS AS NEEDED FOR WHEEZING OR SHORTNESS OF BREATH.  Marland Kitchen amLODipine (NORVASC) 5 MG tablet TAKE 1 TABLET EVERY DAY  . aspirin-acetaminophen-caffeine (EXCEDRIN MIGRAINE) 250-250-65 MG per tablet Take 2 tablets by mouth every 6 (six) hours as needed (arthritis pain).   . calcium elemental as carbonate (TUMS ULTRA 1000) 400 MG chewable tablet Chew 1,000 mg by mouth 2 (two) times daily.  . Capsaicin-Menthol (SALONPAS GEL EX) Apply 1 application topically daily as needed (arthritis pain).  . fluticasone (FLONASE) 50 MCG/ACT nasal spray USE 2 SPRAYS IN EACH NOSTRIL EVERY DAY AS NEEDED FOR ALLERGIES OR RHINITIS  . Glucosamine-Chondroitin 750-600 MG TABS Take 1 tablet by mouth 2 (two) times daily.  . Guaifenesin (MUCINEX MAXIMUM STRENGTH) 1200 MG TB12 Take 600 mg by mouth 2 (two) times daily.  . imiquimod (ALDARA) 5 % cream Apply topically 3 (three) times a week.  . loratadine (CLARITIN) 10 MG tablet Take 10 mg by mouth daily.  . meloxicam (MOBIC) 15 MG tablet Take 1 tablet (15 mg total) by mouth daily as needed for pain.    . Menthol (ROBITUSSIN COUGH DROPS MT) Use as directed 1 lozenge in the mouth or throat 4 (four) times daily as needed (cough).   . Menthol, Topical Analgesic, (BLUE-EMU MAXIMUM STRENGTH EX) Apply 1 application topically daily as needed (arthritis pain).  . montelukast (SINGULAIR) 10 MG tablet TAKE 1 TABLET (10 MG TOTAL) BY MOUTH DAILY.  . Multiple Vitamin (MULTIVITAMIN WITH MINERALS) TABS tablet Take 1 tablet by mouth on Monday, Wednesday, Friday with supper   . mupirocin ointment (BACTROBAN) 2 % Apply 1 application topically 2 (two) times daily.  Viviana Simpler ELLIPTA 100-62.5-25 MCG/INH  AEPB INHALE 1 PUFF INTO THE LUNGS DAILY (NEED MD APPOINTMENT)   No current facility-administered medications on file prior to visit.     Allergies  Allergen Reactions  . Losartan Other (See Comments)    weakness    Review Of Systems:  Constitutional:   No  weight loss, night sweats,  Fevers, chills, fatigue, or  lassitude.  HEENT:   No headaches,  Difficulty swallowing,  Tooth/dental problems, or  Sore throat,                No sneezing, itching, ear ache, nasal congestion, post nasal drip,   CV:  No chest pain,  Orthopnea, PND, swelling in lower extremities, anasarca, dizziness, palpitations, syncope.   GI  No heartburn, indigestion, abdominal pain, nausea, vomiting, diarrhea, change in bowel habits, loss of appetite, bloody stools.   Resp: + shortness of breath with exertion none at rest.  + excess mucus, + productive cough,  No non-productive cough,  + coughing up of blood.  + change in color of mucus.  + baseline wheezing.  No chest wall deformity  Skin: no rash or lesions.  GU: no dysuria, change in color of urine, no urgency or frequency.  No flank pain, no hematuria   MS:  No joint pain or swelling.  No decreased range of motion.  No back pain.  Psych:  No change in mood or affect. No depression or anxiety.  No memory loss.   Vital Signs BP 122/80 (BP Location: Left Arm, Cuff Size:  Normal)   Pulse 91   Temp 98 F (36.7 C) (Oral)   Ht 5\' 7"  (1.702 m)   Wt 148 lb 3.2 oz (67.2 kg)   SpO2 96%   BMI 23.21 kg/m    Physical Exam:  General- No distress,  A&Ox3, pleasant ENT: No sinus tenderness, TM clear, pale nasal mucosa, no oral exudate,no post nasal drip, no LAN Cardiac: S1, S2, regular rate and rhythm, no murmur Chest: No wheeze/ rales/ dullness; no accessory muscle use, no nasal flaring, no sternal retractions Abd.: Soft Non-tender, ND, BS + Ext: No clubbing cyanosis, edema Neuro:  normal strength, MAE x 4, A&O x 3 Skin: No rashes, NO lesions, warm and dry, ontact Psych: normal mood and behavior   Assessment/Plan  Hemoptysis x 3-4 week duration in previous smoker  Previous RML resection Scant, but bright red blood Plan We will order a CTA  Chest to rule out PE. We will try to get this scheduled today. We will call you with the results.  Follow up with Dr. Elsworth Soho or Judson Roch in January 2022. Please call if you need to see [place] sooner. Continue Trelegy once daily, rinse mouth after use. Continue using your rescue inhaler as needed for breakthrough shortness of breath. Note your daily symptoms > remember "red flags" for COPD:  Increase in cough, increase in sputum production, increase in shortness of breath or activity intolerance. If you notice these symptoms, please call to be seen.   Please contact office for sooner follow up if symptoms do not improve or worsen or seek emergency care   Addendum Based on results of CTA, No PE + for  A new air-filled cystic area within the peripheral right upper lung measuring up to 2.3 cm. It is unclear if this represents a small peripheral bleb or pneumatocele. No effusion. Left lung clear. >> Images reviewed by myself and Dr. Ander Slade.   We will repeat CT Chest in 6 weeks, with office follow up  I have called the patient with the results and I have asked him to call sooner if [name] has any additional hemoptysis. [name] verbalized  understanding and had no further questions at completion of the call.   Magdalen Spatz, NP 05/22/2020  10:44 AM

## 2020-05-22 NOTE — Addendum Note (Signed)
Addended by: Eric Form F on: 05/22/2020 05:40 PM   Modules accepted: Orders

## 2020-05-22 NOTE — Patient Instructions (Addendum)
It is good to see you today. We will order a CT Chest. We will try to get this scheduled today. We will call you with the results.  Follow up with Dr. Elsworth Soho or Judson Roch in January 2022. Please call if you need to see [place] sooner. Continue Trelegy once daily, rinse mouth after use. Continue using your rescue inhaler as needed for breakthrough shortness of breath. Note your daily symptoms > remember "red flags" for COPD:  Increase in cough, increase in sputum production, increase in shortness of breath or activity intolerance. If you notice these symptoms, please call to be seen.   Please contact office for sooner follow up if symptoms do not improve or worsen or seek emergency care

## 2020-05-22 NOTE — Telephone Encounter (Signed)
Please schedule for a 6 week follow up after CT chest is done in 6 weeks with Dr. Elsworth Soho, or Judson Roch NP . Thanks so much

## 2020-05-22 NOTE — Telephone Encounter (Addendum)
Spoke with patient regarding prior message per orders [name] advised patient to contact our office for a 6 week f/u after CT.Patient stated he will call after he has a appt set up for CT. Patient's voice was understanding. Nothing else further needed.

## 2020-05-25 NOTE — Progress Notes (Signed)
These results were called to the patient . He is scheduled for a follow up CT Chest without contrast in 6 weeks. Amy, please make sure the order is in for 6 weeks and that he gets scheduled. Thanks so much

## 2020-06-26 ENCOUNTER — Ambulatory Visit (INDEPENDENT_AMBULATORY_CARE_PROVIDER_SITE_OTHER): Payer: Medicare HMO | Admitting: Family Medicine

## 2020-06-26 ENCOUNTER — Ambulatory Visit (HOSPITAL_BASED_OUTPATIENT_CLINIC_OR_DEPARTMENT_OTHER)
Admission: RE | Admit: 2020-06-26 | Discharge: 2020-06-26 | Disposition: A | Discharge status: Home / Self Care | Payer: Medicare HMO | Source: Ambulatory Visit | Attending: Acute Care | Admitting: Acute Care

## 2020-06-26 ENCOUNTER — Encounter: Payer: Self-pay | Admitting: Family Medicine

## 2020-06-26 ENCOUNTER — Other Ambulatory Visit (HOSPITAL_BASED_OUTPATIENT_CLINIC_OR_DEPARTMENT_OTHER): Payer: Self-pay | Admitting: Internal Medicine

## 2020-06-26 ENCOUNTER — Other Ambulatory Visit: Payer: Self-pay

## 2020-06-26 VITALS — BP 132/78 | HR 93 | Temp 98.0°F | Resp 16 | Wt 149.8 lb

## 2020-06-26 DIAGNOSIS — J432 Centrilobular emphysema: Secondary | ICD-10-CM | POA: Diagnosis not present

## 2020-06-26 DIAGNOSIS — R0789 Other chest pain: Secondary | ICD-10-CM

## 2020-06-26 DIAGNOSIS — R739 Hyperglycemia, unspecified: Secondary | ICD-10-CM | POA: Diagnosis not present

## 2020-06-26 DIAGNOSIS — R0982 Postnasal drip: Secondary | ICD-10-CM

## 2020-06-26 DIAGNOSIS — E785 Hyperlipidemia, unspecified: Secondary | ICD-10-CM

## 2020-06-26 DIAGNOSIS — R0981 Nasal congestion: Secondary | ICD-10-CM | POA: Insufficient documentation

## 2020-06-26 DIAGNOSIS — M4319 Spondylolisthesis, multiple sites in spine: Secondary | ICD-10-CM | POA: Diagnosis not present

## 2020-06-26 DIAGNOSIS — I10 Essential (primary) hypertension: Secondary | ICD-10-CM | POA: Diagnosis not present

## 2020-06-26 DIAGNOSIS — R042 Hemoptysis: Secondary | ICD-10-CM | POA: Diagnosis not present

## 2020-06-26 DIAGNOSIS — M1990 Unspecified osteoarthritis, unspecified site: Secondary | ICD-10-CM

## 2020-06-26 DIAGNOSIS — I2510 Atherosclerotic heart disease of native coronary artery without angina pectoris: Secondary | ICD-10-CM | POA: Diagnosis not present

## 2020-06-26 MED FILL — SHINGRIX 50 MCG SUS: 50 | 1 days supply | Qty: 1 | Fill #0

## 2020-06-26 NOTE — Assessment & Plan Note (Signed)
Well controlled, no changes to meds. Encouraged heart healthy diet such as the DASH diet and exercise as tolerated. His bp at home is largely in 130s to 140s notes a rare 150s and he feels well he will continue to monitor and let us know if it trends up

## 2020-06-26 NOTE — Assessment & Plan Note (Signed)
With PND and intermittent scant hemoptysis, work up of chest including repeat CT scan today does not identify source of blood so will proceed with ENT evaluation, referral placed

## 2020-06-26 NOTE — Assessment & Plan Note (Addendum)
Is scant but persistent. CT of chest did not definitely identify a source so will set up for evaluation by ENT to rule out any other lesions. He does acknowledge some scant blood is noted and he does have an intermittent sore throat on the left hand side

## 2020-06-26 NOTE — Patient Instructions (Addendum)
Remember Tylenol/Acetaminophen max of 3000 mg in 24 hours Divided into 3 doses or less if taking less    Shingrix is the new shingles shot 2 shots over 2-6 months at the pharmacy

## 2020-06-26 NOTE — Assessment & Plan Note (Signed)
hgba1c acceptable, minimize simple carbs. Increase exercise as tolerated. Continue current meds 

## 2020-06-26 NOTE — Assessment & Plan Note (Signed)
No c/o today

## 2020-06-26 NOTE — Progress Notes (Signed)
Subjective:    Patient ID: [name], male    DOB: 11/28/1945, 74 y.o.   MRN: [id number]  Chief Complaint  Patient presents with  . Follow-up  . Hypertension  . COPD    HPI Patient is in today for follow up on chronic medical concerns. No recent febrile illness or hospitalizations. He is noting an improvement in his hemoptysis but not complete resolution. He just had a repeat CT scan today to monitor his pulmonary disease. He does endorse some persistent nasal congestion, PND and occasional sore throat. No fevers or chills. Denies CP/palp/SOB/HA/congestion/fevers/GI or GU c/o. Taking meds as prescribed. He continues to struggle with daily arthritic pain in many joints. No redness, warmth or swelling  Past Medical History:  Diagnosis Date  . Allergic rhinitis   . Atypical chest pain 12/01/2014  . Back pain 01/12/2013  . COPD (chronic obstructive pulmonary disease) (HCC)    FeV1 64%-2007  . Emphysema   . GERD (gastroesophageal reflux disease)   . History of lung abscess    bronchiectasis with RMLandRLL ersection -1996- Dr Arlyce Dice  . Hordeolum externum (stye) 04/23/2015   Right eye  . Hypertension   . Impaired vision    glasses  . Osteoarthritis    hands and knees  . Sinusitis, acute 04/23/2015    Past Surgical History:  Procedure Laterality Date  . HERNIA REPAIR  10/2009  . left knee    . lower back surgery  09/2008   06/2009  . LUNG SURGERY     RML andRUL removed due to bleeding and bronchiectasis and lung abcess  . NECK SURGERY  05/2008  . right foot surgery      Family History  Problem Relation Age of Onset  . Prostate cancer Father        father died prostate ca  . Coronary artery disease Other        1st degree relative<60  . Stroke Other        1st degree relative<50  . Heart failure Mother        age 64  . Colon cancer Paternal Grandmother   . Obstructive Sleep Apnea Brother   . Obesity Brother     Social History   Socioeconomic History  . Marital  status: Married    Spouse name: Not on file  . Number of children: Not on file  . Years of education: Not on file  . Highest education level: Not on file  Occupational History  . Occupation: retired Music therapist  Tobacco Use  . Smoking status: Former Smoker    Packs/day: 1.50    Years: 38.00    Pack years: 57.00    Types: Cigarettes    Quit date: 09/03/2000    Years since quitting: 19.8  . Smokeless tobacco: Never Used  Substance and Sexual Activity  . Alcohol use: Yes    Alcohol/week: 2.0 - 3.0 standard drinks    Types: 2 - 3 Cans of beer per week  . Drug use: No  . Sexual activity: Never  Other Topics Concern  . Not on file  Social History Narrative   Retired Music therapist   Patient states former smoker. 1 1/2 ppd x 38 yrs  Quit in Jan 2002   Married - 2 weeks (4th marriage)   divorced,  remarried 84-2004 (lost wife to lung ca),  remarried (divorced),    1 son  - 70 [name])   Alcohol use-yes (2-3 beers per week)  Social Determinants of Health   Financial Resource Strain: Low Risk   . Difficulty of Paying Living Expenses: Not hard at all  Food Insecurity: No Food Insecurity  . Worried About Charity fundraiser in the Last Year: Never true  . Ran Out of Food in the Last Year: Never true  Transportation Needs: No Transportation Needs  . Lack of Transportation (Medical): No  . Lack of Transportation (Non-Medical): No  Physical Activity:   . Days of Exercise per Week: Not on file  . Minutes of Exercise per Session: Not on file  Stress:   . Feeling of Stress : Not on file  Social Connections:   . Frequency of Communication with Friends and Family: Not on file  . Frequency of Social Gatherings with Friends and Family: Not on file  . Attends Religious Services: Not on file  . Active Member of Clubs or Organizations: Not on file  . Attends Archivist Meetings: Not on file  . Marital Status: Not on file  Intimate Partner Violence:   .  Fear of Current or Ex-Partner: Not on file  . Emotionally Abused: Not on file  . Physically Abused: Not on file  . Sexually Abused: Not on file    Outpatient Medications Prior to Visit  Medication Sig Dispense Refill  . albuterol (VENTOLIN HFA) 108 (90 Base) MCG/ACT inhaler INHALE 2 PUFFS INTO THE LUNGS EVERY 6 (SIX) HOURS AS NEEDED FOR WHEEZING OR SHORTNESS OF BREATH. 36 g 2  . calcium elemental as carbonate (TUMS ULTRA 1000) 400 MG chewable tablet Chew 1,000 mg by mouth 2 (two) times daily.    . Capsaicin-Menthol (SALONPAS GEL EX) Apply 1 application topically daily as needed (arthritis pain).    . fluticasone (FLONASE) 50 MCG/ACT nasal spray USE 2 SPRAYS IN EACH NOSTRIL EVERY DAY AS NEEDED FOR ALLERGIES OR RHINITIS 48 g 3  . Glucosamine-Chondroitin 750-600 MG TABS Take 1 tablet by mouth 2 (two) times daily.    . Guaifenesin (MUCINEX MAXIMUM STRENGTH) 1200 MG TB12 Take 600 mg by mouth 2 (two) times daily.    . imiquimod (ALDARA) 5 % cream Apply topically 3 (three) times a week. 12 each 1  . loratadine (CLARITIN) 10 MG tablet Take 10 mg by mouth daily.    . meloxicam (MOBIC) 15 MG tablet Take 1 tablet (15 mg total) by mouth daily as needed for pain. 90 tablet 1  . Menthol (ROBITUSSIN COUGH DROPS MT) Use as directed 1 lozenge in the mouth or throat 4 (four) times daily as needed (cough).     . Menthol, Topical Analgesic, (BLUE-EMU MAXIMUM STRENGTH EX) Apply 1 application topically daily as needed (arthritis pain).    . montelukast (SINGULAIR) 10 MG tablet TAKE 1 TABLET (10 MG TOTAL) BY MOUTH DAILY. 90 tablet 3  . Multiple Vitamin (MULTIVITAMIN WITH MINERALS) TABS tablet Take 1 tablet by mouth on Monday, Wednesday, Friday with supper     . TRELEGY ELLIPTA 100-62.5-25 MCG/INH AEPB INHALE 1 PUFF INTO THE LUNGS DAILY (NEED MD APPOINTMENT) 180 each 1  . acetaminophen (TYLENOL) 325 MG tablet Take 650 mg by mouth every 6 (six) hours as needed.    . mupirocin ointment (BACTROBAN) 2 % Apply 1  application topically 2 (two) times daily. 22 g 1  . amLODipine (NORVASC) 5 MG tablet TAKE 1 TABLET EVERY DAY 90 tablet 1  . aspirin-acetaminophen-caffeine (EXCEDRIN MIGRAINE) 878-676-72 MG per tablet Take 2 tablets by mouth every 6 (six) hours as needed (arthritis pain).  No facility-administered medications prior to visit.    Allergies  Allergen Reactions  . Losartan Other (See Comments)    weakness    Review of Systems  Constitutional: Negative for fever and malaise/fatigue.  HENT: Positive for congestion and sore throat.   Eyes: Negative for blurred vision.  Respiratory: Positive for hemoptysis and sputum production. Negative for cough, shortness of breath and wheezing.   Cardiovascular: Negative for chest pain, palpitations and leg swelling.  Gastrointestinal: Negative for abdominal pain, blood in stool and nausea.  Genitourinary: Negative for dysuria and frequency.  Musculoskeletal: Positive for back pain and joint pain. Negative for falls.  Skin: Negative for rash.  Neurological: Negative for dizziness, loss of consciousness and headaches.  Endo/Heme/Allergies: Negative for environmental allergies.  Psychiatric/Behavioral: Negative for depression. The patient is not nervous/anxious.        Objective:    Physical Exam Vitals and nursing note reviewed.  Constitutional:      General: He is not in acute distress.    Appearance: He is well-developed.  HENT:     Head: Normocephalic and atraumatic.     Nose: Nose normal.  Eyes:     General:        Right eye: No discharge.        Left eye: No discharge.  Cardiovascular:     Rate and Rhythm: Normal rate and regular rhythm.     Heart sounds: No murmur heard.   Pulmonary:     Effort: Pulmonary effort is normal.     Breath sounds: Normal breath sounds.  Abdominal:     General: Bowel sounds are normal.     Palpations: Abdomen is soft.     Tenderness: There is no abdominal tenderness.  Musculoskeletal:     Cervical  back: Normal range of motion and neck supple.  Skin:    General: Skin is warm and dry.  Neurological:     Mental Status: He is alert and oriented to person, place, and time.     BP 132/78   Pulse 93   Temp 98 F (36.7 C) (Oral)   Resp 16   Wt 149 lb 12.8 oz (67.9 kg)   SpO2 96%   BMI 23.46 kg/m  Wt Readings from Last 3 Encounters:  06/26/20 149 lb 12.8 oz (67.9 kg)  05/22/20 148 lb 3.2 oz (67.2 kg)  05/03/20 148 lb (67.1 kg)    Diabetic Foot Exam - Simple   No data filed     Lab Results  Component Value Date   WBC 6.2 06/26/2020   HGB 14.3 06/26/2020   HCT 42.2 06/26/2020   PLT 233 06/26/2020   GLUCOSE 98 06/26/2020   CHOL 174 06/26/2020   TRIG 342 (H) 06/26/2020   HDL 42 06/26/2020   LDLDIRECT 85 07/22/2014   LDLCALC 86 06/26/2020   ALT 17 06/26/2020   AST 24 06/26/2020   NA 139 06/26/2020   K 4.4 06/26/2020   CL 104 06/26/2020   CREATININE 0.84 06/26/2020   BUN 23 06/26/2020   CO2 26 06/26/2020   TSH 0.80 12/23/2019   PSA 2.55 12/23/2019   INR 1.18 09/28/2016   HGBA1C 5.5 12/23/2019    Lab Results  Component Value Date   TSH 0.80 12/23/2019   Lab Results  Component Value Date   WBC 6.2 06/26/2020   HGB 14.3 06/26/2020   HCT 42.2 06/26/2020   MCV 93.4 06/26/2020   PLT 233 06/26/2020   Lab Results  Component Value Date  NA 139 06/26/2020   K 4.4 06/26/2020   CO2 26 06/26/2020   GLUCOSE 98 06/26/2020   BUN 23 06/26/2020   CREATININE 0.84 06/26/2020   BILITOT 0.5 06/26/2020   ALKPHOS 65 03/29/2020   AST 24 06/26/2020   ALT 17 06/26/2020   PROT 6.7 06/26/2020   ALBUMIN 3.7 03/29/2020   CALCIUM 9.3 06/26/2020   ANIONGAP 6 09/30/2016   GFR 100.39 05/22/2020   Lab Results  Component Value Date   CHOL 174 06/26/2020   Lab Results  Component Value Date   HDL 42 06/26/2020   Lab Results  Component Value Date   LDLCALC 86 06/26/2020   Lab Results  Component Value Date   TRIG 342 (H) 06/26/2020   Lab Results  Component Value  Date   CHOLHDL 4.1 06/26/2020   Lab Results  Component Value Date   HGBA1C 5.5 12/23/2019       Assessment & Plan:   Problem List Items Addressed This Visit    Benign essential HTN    Well controlled, no changes to meds. Encouraged heart healthy diet such as the DASH diet and exercise as tolerated. His bp at home is largely in 130s to 140s notes a rare 150s and he feels well he will continue to monitor and let us know if it trends up      Relevant Orders   CBC (Completed)   Comprehensive metabolic panel (Completed)   TSH   Arthritis    He gets some relief from Meloxicam and Acetaminophen but pain does affect his daily function. Stay as active as able and consider heat, ice and topical treatments like lidocaine etc.      Atypical chest pain    No c/o today      Hyperglycemia    hgba1c acceptable, minimize simple carbs. Increase exercise as tolerated. Continue current meds      Relevant Orders   Hemoglobin A1c   Hyperlipidemia    Encouraged heart healthy diet, increase exercise, avoid trans fats, consider a krill oil cap daily      Relevant Orders   Lipid panel (Completed)   Hemoptysis    Is scant but persistent. CT of chest did not definitely identify a source so will set up for evaluation by ENT to rule out any other lesions. He does acknowledge some scant blood is noted and he does have an intermittent sore throat on the left hand side      Relevant Orders   Ambulatory referral to ENT   Sinus congestion    With PND and intermittent scant hemoptysis, work up of chest including repeat CT scan today does not identify source of blood so will proceed with ENT evaluation, referral placed      Relevant Orders   Ambulatory referral to ENT    Other Visit Diagnoses    PND (post-nasal drip)    -  Primary   Relevant Orders   Ambulatory referral to ENT      I have discontinued Jadarian S. Shore "Al"'s aspirin-acetaminophen-caffeine, mupirocin ointment, and  acetaminophen. I am also having him maintain his loratadine, Menthol (ROBITUSSIN COUGH DROPS MT), Guaifenesin, Glucosamine-Chondroitin, calcium elemental as carbonate, multivitamin with minerals, Capsaicin-Menthol (SALONPAS GEL EX), (Menthol, Topical Analgesic, (BLUE-EMU MAXIMUM STRENGTH EX)), amLODipine, montelukast, imiquimod, fluticasone, meloxicam, Trelegy Ellipta, and albuterol.  No orders of the defined types were placed in this encounter.    Penni Homans, MD

## 2020-06-26 NOTE — Assessment & Plan Note (Signed)
He gets some relief from Meloxicam and Acetaminophen but pain does affect his daily function. Stay as active as able and consider heat, ice and topical treatments like lidocaine etc.

## 2020-06-26 NOTE — Assessment & Plan Note (Signed)
Encouraged heart healthy diet, increase exercise, avoid trans fats, consider a krill oil cap daily 

## 2020-06-27 ENCOUNTER — Other Ambulatory Visit: Payer: Self-pay

## 2020-06-27 DIAGNOSIS — E785 Hyperlipidemia, unspecified: Secondary | ICD-10-CM

## 2020-06-27 DIAGNOSIS — I10 Essential (primary) hypertension: Secondary | ICD-10-CM

## 2020-06-27 LAB — COMPREHENSIVE METABOLIC PANEL
AG Ratio: 1.5 (calc) (ref 1.0–2.5)
ALT: 17 U/L (ref 9–46)
AST: 24 U/L (ref 10–35)
Albumin: 4 g/dL (ref 3.6–5.1)
Alkaline phosphatase (APISO): 71 U/L (ref 35–144)
BUN: 23 mg/dL (ref 7–25)
CO2: 26 mmol/L (ref 20–32)
Calcium: 9.3 mg/dL (ref 8.6–10.3)
Chloride: 104 mmol/L (ref 98–110)
Creat: 0.84 mg/dL (ref 0.70–1.18)
Globulin: 2.7 g/dL (calc) (ref 1.9–3.7)
Glucose, Bld: 98 mg/dL (ref 65–99)
Potassium: 4.4 mmol/L (ref 3.5–5.3)
Sodium: 139 mmol/L (ref 135–146)
Total Bilirubin: 0.5 mg/dL (ref 0.2–1.2)
Total Protein: 6.7 g/dL (ref 6.1–8.1)

## 2020-06-27 LAB — CBC
HCT: 42.2 % (ref 38.5–50.0)
Hemoglobin: 14.3 g/dL (ref 13.2–17.1)
MCH: 31.6 pg (ref 27.0–33.0)
MCHC: 33.9 g/dL (ref 32.0–36.0)
MCV: 93.4 fL (ref 80.0–100.0)
MPV: 11 fL (ref 7.5–12.5)
Platelets: 233 10*3/uL (ref 140–400)
RBC: 4.52 10*6/uL (ref 4.20–5.80)
RDW: 12.4 % (ref 11.0–15.0)
WBC: 6.2 10*3/uL (ref 3.8–10.8)

## 2020-06-27 LAB — LIPID PANEL
Cholesterol: 174 mg/dL (ref <200)
HDL: 42 mg/dL (ref >=40)
LDL Cholesterol (Calc): 86 mg/dL (calc)
Non-HDL Cholesterol (Calc): 132 mg/dL (calc) — ABNORMAL HIGH (ref <130)
Total CHOL/HDL Ratio: 4.1 (calc) (ref <5.0)
Triglycerides: 342 mg/dL — ABNORMAL HIGH (ref <150)

## 2020-06-27 LAB — HEMOGLOBIN A1C
Hgb A1c MFr Bld: 5.4 % of total Hgb (ref <5.7)
Mean Plasma Glucose: 108 (calc)
eAG (mmol/L): 6 (calc)

## 2020-06-27 LAB — TSH: TSH: 0.5 mIU/L (ref 0.40–4.50)

## 2020-06-27 NOTE — Progress Notes (Signed)
Attempted to call pt. LVM to call back.  

## 2020-06-27 NOTE — Progress Notes (Signed)
Pt is aware of lab results. Scheduled for 09/18/20 for labs. Order is in

## 2020-06-29 NOTE — Progress Notes (Signed)
Please refer this patient to [facility] for monitoring of that bronchopleural fistula.I have called him with the results and he is aware. Thanks so much

## 2020-06-30 ENCOUNTER — Other Ambulatory Visit: Payer: Self-pay | Admitting: *Deleted

## 2020-06-30 DIAGNOSIS — R918 Other nonspecific abnormal finding of lung field: Secondary | ICD-10-CM

## 2020-06-30 DIAGNOSIS — L988 Other specified disorders of the skin and subcutaneous tissue: Secondary | ICD-10-CM

## 2020-07-01 ENCOUNTER — Other Ambulatory Visit: Payer: Self-pay | Admitting: Family Medicine

## 2020-07-05 ENCOUNTER — Other Ambulatory Visit: Payer: Self-pay | Admitting: *Deleted

## 2020-07-05 ENCOUNTER — Other Ambulatory Visit: Payer: Self-pay

## 2020-07-05 ENCOUNTER — Encounter: Payer: Self-pay | Admitting: Thoracic Surgery (Cardiothoracic Vascular Surgery)

## 2020-07-05 ENCOUNTER — Institutional Professional Consult (permissible substitution): Payer: Medicare HMO | Admitting: Thoracic Surgery (Cardiothoracic Vascular Surgery)

## 2020-07-05 VITALS — BP 186/95 | HR 50 | Resp 18 | Ht 67.0 in | Wt 150.0 lb

## 2020-07-05 DIAGNOSIS — J860 Pyothorax with fistula: Secondary | ICD-10-CM | POA: Diagnosis not present

## 2020-07-05 DIAGNOSIS — R042 Hemoptysis: Secondary | ICD-10-CM

## 2020-07-05 NOTE — Progress Notes (Signed)
PCP is Mosie Lukes, MD Referring Provider is Magdalen Spatz, NP  Chief Complaint  Patient presents with  . Consult    Surgical Consult for Bronchopleural fistula CT chest 06/26/20    HPI: [name] is sent for consultation regarding hemoptysis.  [name] is a 74 year old man with a history of lung abscess, right upper and middle lobectomy in 1996, tobacco abuse (quit 2002), severe allergies, osteoarthritis, back pain, and reflux.  Back in July he noted a small amount of blood after coughing early 1 morning.  He said persistent hemoptysis ever since then.  It is sporadic.  It will happen 2-3 times during the week and then not happen for a week or 2.  He says is only a very small now and almost always is in the mornings.  He had a CT of the chest in September, which showed a cystic area superiorly with a thick wall.  It was unclear if this represented bleb, pneumatocele, or pleural air collection.  A repeat scan was done on 06/26/2020.  It showed slight interval enlargement of the cavitary lesion and favored to represent loculated pleural air/bronchopleural fistula.  He denies any fevers, chills, or night sweats.  He does have problems with chronic sinus congestion and drainage.  He complains of some decreased energy.  He has shortness of breath with exertion but that has not changed recently.  Primary complaint is pain related to multiple orthopedic and spine procedures and arthritis.  He does complain of some pain on the left side of his sternum aggravated by frequent coughing. [name]: At the time of surgery this patient's most appropriate activity status/level should be described as: []     0    Normal activity, no symptoms [x]     1    Restricted in physical strenuous activity but ambulatory, able to do out light work []     2    Ambulatory and capable of self care, unable to do work activities, up and about >50 % of waking hours                              []     3    Only limited  self care, in bed greater than 50% of waking hours []     4    Completely disabled, no self care, confined to bed or chair []     5    Moribund   Past Medical History:  Diagnosis Date  . Allergic rhinitis   . Atypical chest pain 12/01/2014  . Back pain 01/12/2013  . COPD (chronic obstructive pulmonary disease) (HCC)    FeV1 64%-2007  . Emphysema   . GERD (gastroesophageal reflux disease)   . History of lung abscess    bronchiectasis with RMLandRLL ersection -1996- Dr Arlyce Dice  . Hordeolum externum (stye) 04/23/2015   Right eye  . Hypertension   . Impaired vision    glasses  . Osteoarthritis    hands and knees  . Sinusitis, acute 04/23/2015    Past Surgical History:  Procedure Laterality Date  . HERNIA REPAIR  10/2009  . left knee    . lower back surgery  09/2008   06/2009  . LUNG SURGERY     RML andRUL removed due to bleeding and bronchiectasis and lung abcess  . NECK SURGERY  05/2008  . right foot surgery      Family History  Problem Relation Age of Onset  .  Prostate cancer Father        father died prostate ca  . Coronary artery disease Other        1st degree relative<60  . Stroke Other        1st degree relative<50  . Heart failure Mother        age 82  . Colon cancer Paternal Grandmother   . Obstructive Sleep Apnea Brother   . Obesity Brother     Social History Social History   Tobacco Use  . Smoking status: Former Smoker    Packs/day: 1.50    Years: 38.00    Pack years: 57.00    Types: Cigarettes    Quit date: 09/03/2000    Years since quitting: 19.8  . Smokeless tobacco: Never Used  Substance Use Topics  . Alcohol use: Yes    Alcohol/week: 2.0 - 3.0 standard drinks    Types: 2 - 3 Cans of beer per week  . Drug use: No    Current Outpatient Medications  Medication Sig Dispense Refill  . albuterol (VENTOLIN HFA) 108 (90 Base) MCG/ACT inhaler INHALE 2 PUFFS INTO THE LUNGS EVERY 6 (SIX) HOURS AS NEEDED FOR WHEEZING OR SHORTNESS OF BREATH. 36 g 2  .  amLODipine (NORVASC) 5 MG tablet TAKE 1 TABLET EVERY DAY 90 tablet 1  . calcium elemental as carbonate (TUMS ULTRA 1000) 400 MG chewable tablet Chew 1,000 mg by mouth 2 (two) times daily.    . Capsaicin-Menthol (SALONPAS GEL EX) Apply 1 application topically daily as needed (arthritis pain).    . fluticasone (FLONASE) 50 MCG/ACT nasal spray USE 2 SPRAYS IN EACH NOSTRIL EVERY DAY AS NEEDED FOR ALLERGIES OR RHINITIS 48 g 3  . Glucosamine-Chondroitin 750-600 MG TABS Take 1 tablet by mouth 2 (two) times daily.    . Guaifenesin (MUCINEX MAXIMUM STRENGTH) 1200 MG TB12 Take 600 mg by mouth 2 (two) times daily.    Marland Kitchen loratadine (CLARITIN) 10 MG tablet Take 10 mg by mouth daily.    . meloxicam (MOBIC) 15 MG tablet Take 1 tablet (15 mg total) by mouth daily as needed for pain. 90 tablet 1  . Menthol (ROBITUSSIN COUGH DROPS MT) Use as directed 1 lozenge in the mouth or throat 4 (four) times daily as needed (cough).     . Menthol, Topical Analgesic, (BLUE-EMU MAXIMUM STRENGTH EX) Apply 1 application topically daily as needed (arthritis pain).    . montelukast (SINGULAIR) 10 MG tablet TAKE 1 TABLET (10 MG TOTAL) BY MOUTH DAILY. 90 tablet 3  . Multiple Vitamin (MULTIVITAMIN WITH MINERALS) TABS tablet Take 1 tablet by mouth on Monday, Wednesday, Friday with supper     . TRELEGY ELLIPTA 100-62.5-25 MCG/INH AEPB INHALE 1 PUFF INTO THE LUNGS DAILY (NEED MD APPOINTMENT) 180 each 1  . imiquimod (ALDARA) 5 % cream Apply topically 3 (three) times a week. (Patient not taking: Reported on 07/05/2020) 12 each 1   No current facility-administered medications for this visit.    Allergies  Allergen Reactions  . Losartan Other (See Comments)    weakness    Review of Systems  Constitutional: Positive for fatigue.  HENT: Positive for dental problem (Dentures). Negative for trouble swallowing and voice change.   Respiratory: Positive for cough, shortness of breath and wheezing.   Cardiovascular:       Arrhythmia   Genitourinary: Negative for difficulty urinating and dysuria.  Musculoskeletal: Positive for arthralgias, back pain, gait problem, joint swelling and neck pain.  Neurological: Negative for syncope  and weakness.  Hematological: Bruises/bleeds easily.    BP (!) 186/95 (BP Location: Left Arm, Patient Position: Sitting)   Pulse (!) 50   Resp 18   Ht 5\' 7"  (1.702 m)   Wt 150 lb (68 kg)   SpO2 97% Comment: RA with mask on  BMI 23.49 kg/m  Physical Exam Vitals reviewed.  Constitutional:      General: He is not in acute distress.    Appearance: Normal appearance.  HENT:     Head: Normocephalic and atraumatic.  Eyes:     General: No scleral icterus. Cardiovascular:     Rate and Rhythm: Normal rate. Rhythm irregular.     Heart sounds: No murmur heard.  No friction rub. No gallop.   Pulmonary:     Effort: Pulmonary effort is normal. No respiratory distress.     Breath sounds: No wheezing or rales.     Comments: Absent breath sounds right base, diminished bilaterally Abdominal:     General: There is no distension.     Palpations: Abdomen is soft.     Tenderness: There is no abdominal tenderness.  Musculoskeletal:     Cervical back: Neck supple.  Lymphadenopathy:     Cervical: No cervical adenopathy.  Skin:    General: Skin is warm and dry.  Neurological:     General: No focal deficit present.     Mental Status: He is alert.     Cranial Nerves: No cranial nerve deficit.     Motor: No weakness.    Diagnostic Tests: CT CHEST WITHOUT CONTRAST  TECHNIQUE: Multidetector CT imaging of the chest was performed following the standard protocol without IV contrast.  COMPARISON:  05/22/2020  FINDINGS: Cardiovascular: Aortic atherosclerosis. Tortuous thoracic aorta. Normal heart size, without pericardial effusion. Left main coronary artery calcification.  Mediastinum/Nodes: Mild mediastinal shift to the right. No mediastinal or definite hilar adenopathy, given limitations  of unenhanced CT.  Lungs/Pleura: No pleural fluid. Right-sided pleural thickening superiorly, similar.  Right upper and right middle lobectomy.  Mild to moderate centrilobular emphysema.  Cavitary lesion in the superolateral right hemithorax again identified. Maximally 2.8 cm on 37/3 today versus 2.3 cm on the prior exam. On coronal images, the airway can be seen extending towards this structure (including images 79 through 92 of series 5), favoring loculated pleural air in the setting of bronchopleural fistula. Mild wall thickening/irregularity inferiorly on coronal image 90/5 is chronic.  Upper Abdomen: Normal imaged portions of the liver, spleen, stomach, pancreas, gallbladder, right adrenal gland, kidneys. Mild left adrenal nodularity and thickening. Abdominal aortic atherosclerosis.  Musculoskeletal: Cervical and upper lumbar spine fixation. Degenerative changes of both shoulders. Extensive thoracic spondylosis. Similar anterolisthesis of C7 on T1.  IMPRESSION: 1. Status post right upper and right middle lobectomy. 2. Slight enlargement of a cavitary lesion within the superior right hemithorax. Favored to represent loculated pleural air in the setting of bronchopleural fistula. 3. No specific explanation for hemoptysis. 4. Aortic atherosclerosis (ICD10-I70.0), coronary artery atherosclerosis and emphysema (ICD10-J43.9).   Electronically Signed   By: [name] M.D.   On: 06/26/2020 09:09 I personally reviewed the chest x-ray images.  I am not sure that the lesion in the superior part of the chest has enlarged so much as it is more cavitary and some of the fluid that was there previously is no longer present.  Impression: [name] is a 74 year old man with a history of lung abscess, right upper and middle lobectomy in 1996, tobacco abuse (quit 2002), severe  allergies, osteoarthritis, back pain, and reflux.  He presents with a 54-month history of  hemoptysis.  This has been sporadic and always very small amounts.  However, I do think it warrants further investigation.  It is not clear what the cavitary lesion in the upper part of the lung represents.  This could be pleural-based process contained by surrounding scar tissue.  Also could be a cavitary lesion in the lung that has partially drained.  Also in the differential but less likely is malignancy.  I recommended to him that we proceed with bronchoscopy to see if we can identify a source of hemoptysis.  We can potentially laser if a source is identified.  I informed him of the general nature of the procedure.  We would plan to do it in the operating room under general anesthesia.  I informed him of the indications, risks, benefits, and alternatives.  He understands we may not find any source for the bleeding.  He understands the risk include associated with general anesthesia.  In general the risks include, but not limited to death, MI, DVT, PE, stroke, bleeding, pneumothorax, and failure to make a diagnosis.  He accepts the risks and wishes to proceed.  He is scheduled to see ENT in consultation.  I recommended that he keep that appointment, as it is possible that the source is more upper airway.  Plan: Bronchoscopy, possible laser bronchoscopy on Wednesday, 07/12/2020.  I spent 30 minutes in review of images, records, and in consultation with Mr. Destin today. [name], MD [facility] [phone]

## 2020-07-05 NOTE — H&P (View-Only) (Signed)
PCP is Mosie Lukes, MD Referring Provider is Magdalen Spatz, NP  Chief Complaint  Patient presents with  . Consult    Surgical Consult for Bronchopleural fistula CT chest 06/26/20    HPI: Mr. Repetto is sent for consultation regarding hemoptysis.  [name] is a 74 year old man with a history of lung abscess, right upper and middle lobectomy in 1996, tobacco abuse (quit 2002), severe allergies, osteoarthritis, back pain, and reflux.  Back in July he noted a small amount of blood after coughing early 1 morning.  He said persistent hemoptysis ever since then.  It is sporadic.  It will happen 2-3 times during the week and then not happen for a week or 2.  He says is only a very small now and almost always is in the mornings.  He had a CT of the chest in September, which showed a cystic area superiorly with a thick wall.  It was unclear if this represented bleb, pneumatocele, or pleural air collection.  A repeat scan was done on 06/26/2020.  It showed slight interval enlargement of the cavitary lesion and favored to represent loculated pleural air/bronchopleural fistula.  He denies any fevers, chills, or night sweats.  He does have problems with chronic sinus congestion and drainage.  He complains of some decreased energy.  He has shortness of breath with exertion but that has not changed recently.  Primary complaint is pain related to multiple orthopedic and spine procedures and arthritis.  He does complain of some pain on the left side of his sternum aggravated by frequent coughing. Zubrod Score: At the time of surgery this patient's most appropriate activity status/level should be described as: []     0    Normal activity, no symptoms [x]     1    Restricted in physical strenuous activity but ambulatory, able to do out light work []     2    Ambulatory and capable of self care, unable to do work activities, up and about >50 % of waking hours                              []     3    Only limited  self care, in bed greater than 50% of waking hours []     4    Completely disabled, no self care, confined to bed or chair []     5    Moribund   Past Medical History:  Diagnosis Date  . Allergic rhinitis   . Atypical chest pain 12/01/2014  . Back pain 01/12/2013  . COPD (chronic obstructive pulmonary disease) (HCC)    FeV1 64%-2007  . Emphysema   . GERD (gastroesophageal reflux disease)   . History of lung abscess    bronchiectasis with RMLandRLL ersection -1996- Dr Arlyce Dice  . Hordeolum externum (stye) 04/23/2015   Right eye  . Hypertension   . Impaired vision    glasses  . Osteoarthritis    hands and knees  . Sinusitis, acute 04/23/2015    Past Surgical History:  Procedure Laterality Date  . HERNIA REPAIR  10/2009  . left knee    . lower back surgery  09/2008   06/2009  . LUNG SURGERY     RML andRUL removed due to bleeding and bronchiectasis and lung abcess  . NECK SURGERY  05/2008  . right foot surgery      Family History  Problem Relation Age of Onset  .  Prostate cancer Father        father died prostate ca  . Coronary artery disease Other        1st degree relative<60  . Stroke Other        1st degree relative<50  . Heart failure Mother        age 78  . Colon cancer Paternal Grandmother   . Obstructive Sleep Apnea Brother   . Obesity Brother     Social History Social History   Tobacco Use  . Smoking status: Former Smoker    Packs/day: 1.50    Years: 38.00    Pack years: 57.00    Types: Cigarettes    Quit date: 09/03/2000    Years since quitting: 19.8  . Smokeless tobacco: Never Used  Substance Use Topics  . Alcohol use: Yes    Alcohol/week: 2.0 - 3.0 standard drinks    Types: 2 - 3 Cans of beer per week  . Drug use: No    Current Outpatient Medications  Medication Sig Dispense Refill  . albuterol (VENTOLIN HFA) 108 (90 Base) MCG/ACT inhaler INHALE 2 PUFFS INTO THE LUNGS EVERY 6 (SIX) HOURS AS NEEDED FOR WHEEZING OR SHORTNESS OF BREATH. 36 g 2  .  amLODipine (NORVASC) 5 MG tablet TAKE 1 TABLET EVERY DAY 90 tablet 1  . calcium elemental as carbonate (TUMS ULTRA 1000) 400 MG chewable tablet Chew 1,000 mg by mouth 2 (two) times daily.    . Capsaicin-Menthol (SALONPAS GEL EX) Apply 1 application topically daily as needed (arthritis pain).    . fluticasone (FLONASE) 50 MCG/ACT nasal spray USE 2 SPRAYS IN EACH NOSTRIL EVERY DAY AS NEEDED FOR ALLERGIES OR RHINITIS 48 g 3  . Glucosamine-Chondroitin 750-600 MG TABS Take 1 tablet by mouth 2 (two) times daily.    . Guaifenesin (MUCINEX MAXIMUM STRENGTH) 1200 MG TB12 Take 600 mg by mouth 2 (two) times daily.    Marland Kitchen loratadine (CLARITIN) 10 MG tablet Take 10 mg by mouth daily.    . meloxicam (MOBIC) 15 MG tablet Take 1 tablet (15 mg total) by mouth daily as needed for pain. 90 tablet 1  . Menthol (ROBITUSSIN COUGH DROPS MT) Use as directed 1 lozenge in the mouth or throat 4 (four) times daily as needed (cough).     . Menthol, Topical Analgesic, (BLUE-EMU MAXIMUM STRENGTH EX) Apply 1 application topically daily as needed (arthritis pain).    . montelukast (SINGULAIR) 10 MG tablet TAKE 1 TABLET (10 MG TOTAL) BY MOUTH DAILY. 90 tablet 3  . Multiple Vitamin (MULTIVITAMIN WITH MINERALS) TABS tablet Take 1 tablet by mouth on Monday, Wednesday, Friday with supper     . TRELEGY ELLIPTA 100-62.5-25 MCG/INH AEPB INHALE 1 PUFF INTO THE LUNGS DAILY (NEED MD APPOINTMENT) 180 each 1  . imiquimod (ALDARA) 5 % cream Apply topically 3 (three) times a week. (Patient not taking: Reported on 07/05/2020) 12 each 1   No current facility-administered medications for this visit.    Allergies  Allergen Reactions  . Losartan Other (See Comments)    weakness    Review of Systems  Constitutional: Positive for fatigue.  HENT: Positive for dental problem (Dentures). Negative for trouble swallowing and voice change.   Respiratory: Positive for cough, shortness of breath and wheezing.   Cardiovascular:       Arrhythmia   Genitourinary: Negative for difficulty urinating and dysuria.  Musculoskeletal: Positive for arthralgias, back pain, gait problem, joint swelling and neck pain.  Neurological: Negative for syncope  and weakness.  Hematological: Bruises/bleeds easily.    BP (!) 186/95 (BP Location: Left Arm, Patient Position: Sitting)   Pulse (!) 50   Resp 18   Ht 5\' 7"  (1.702 m)   Wt 150 lb (68 kg)   SpO2 97% Comment: RA with mask on  BMI 23.49 kg/m  Physical Exam Vitals reviewed.  Constitutional:      General: He is not in acute distress.    Appearance: Normal appearance.  HENT:     Head: Normocephalic and atraumatic.  Eyes:     General: No scleral icterus. Cardiovascular:     Rate and Rhythm: Normal rate. Rhythm irregular.     Heart sounds: No murmur heard.  No friction rub. No gallop.   Pulmonary:     Effort: Pulmonary effort is normal. No respiratory distress.     Breath sounds: No wheezing or rales.     Comments: Absent breath sounds right base, diminished bilaterally Abdominal:     General: There is no distension.     Palpations: Abdomen is soft.     Tenderness: There is no abdominal tenderness.  Musculoskeletal:     Cervical back: Neck supple.  Lymphadenopathy:     Cervical: No cervical adenopathy.  Skin:    General: Skin is warm and dry.  Neurological:     General: No focal deficit present.     Mental Status: He is alert.     Cranial Nerves: No cranial nerve deficit.     Motor: No weakness.    Diagnostic Tests: CT CHEST WITHOUT CONTRAST  TECHNIQUE: Multidetector CT imaging of the chest was performed following the standard protocol without IV contrast.  COMPARISON:  05/22/2020  FINDINGS: Cardiovascular: Aortic atherosclerosis. Tortuous thoracic aorta. Normal heart size, without pericardial effusion. Left main coronary artery calcification.  Mediastinum/Nodes: Mild mediastinal shift to the right. No mediastinal or definite hilar adenopathy, given limitations  of unenhanced CT.  Lungs/Pleura: No pleural fluid. Right-sided pleural thickening superiorly, similar.  Right upper and right middle lobectomy.  Mild to moderate centrilobular emphysema.  Cavitary lesion in the superolateral right hemithorax again identified. Maximally 2.8 cm on 37/3 today versus 2.3 cm on the prior exam. On coronal images, the airway can be seen extending towards this structure (including images 79 through 92 of series 5), favoring loculated pleural air in the setting of bronchopleural fistula. Mild wall thickening/irregularity inferiorly on coronal image 90/5 is chronic.  Upper Abdomen: Normal imaged portions of the liver, spleen, stomach, pancreas, gallbladder, right adrenal gland, kidneys. Mild left adrenal nodularity and thickening. Abdominal aortic atherosclerosis.  Musculoskeletal: Cervical and upper lumbar spine fixation. Degenerative changes of both shoulders. Extensive thoracic spondylosis. Similar anterolisthesis of C7 on T1.  IMPRESSION: 1. Status post right upper and right middle lobectomy. 2. Slight enlargement of a cavitary lesion within the superior right hemithorax. Favored to represent loculated pleural air in the setting of bronchopleural fistula. 3. No specific explanation for hemoptysis. 4. Aortic atherosclerosis (ICD10-I70.0), coronary artery atherosclerosis and emphysema (ICD10-J43.9).   Electronically Signed   By: Abigail Miyamoto M.D.   On: 06/26/2020 09:09 I personally reviewed the chest x-ray images.  I am not sure that the lesion in the superior part of the chest has enlarged so much as it is more cavitary and some of the fluid that was there previously is no longer present.  Impression: [name] is a 74 year old man with a history of lung abscess, right upper and middle lobectomy in 1996, tobacco abuse (quit 2002), severe  allergies, osteoarthritis, back pain, and reflux.  He presents with a 82-month history of  hemoptysis.  This has been sporadic and always very small amounts.  However, I do think it warrants further investigation.  It is not clear what the cavitary lesion in the upper part of the lung represents.  This could be pleural-based process contained by surrounding scar tissue.  Also could be a cavitary lesion in the lung that has partially drained.  Also in the differential but less likely is malignancy.  I recommended to him that we proceed with bronchoscopy to see if we can identify a source of hemoptysis.  We can potentially laser if a source is identified.  I informed him of the general nature of the procedure.  We would plan to do it in the operating room under general anesthesia.  I informed him of the indications, risks, benefits, and alternatives.  He understands we may not find any source for the bleeding.  He understands the risk include associated with general anesthesia.  In general the risks include, but not limited to death, MI, DVT, PE, stroke, bleeding, pneumothorax, and failure to make a diagnosis.  He accepts the risks and wishes to proceed.  He is scheduled to see ENT in consultation.  I recommended that he keep that appointment, as it is possible that the source is more upper airway.  Plan: Bronchoscopy, possible laser bronchoscopy on Wednesday, 07/12/2020.  I spent 30 minutes in review of images, records, and in consultation with Mr. Coolman today. [name], MD [facility] [phone]

## 2020-07-10 ENCOUNTER — Encounter (HOSPITAL_COMMUNITY): Payer: Self-pay

## 2020-07-10 ENCOUNTER — Other Ambulatory Visit: Payer: Self-pay

## 2020-07-10 ENCOUNTER — Encounter (HOSPITAL_COMMUNITY)
Admission: RE | Admit: 2020-07-10 | Discharge: 2020-07-10 | Disposition: A | Discharge status: Home / Self Care | Payer: Medicare HMO | Source: Ambulatory Visit | Attending: Thoracic Surgery (Cardiothoracic Vascular Surgery) | Admitting: Thoracic Surgery (Cardiothoracic Vascular Surgery)

## 2020-07-10 ENCOUNTER — Ambulatory Visit (HOSPITAL_COMMUNITY)
Admission: RE | Admit: 2020-07-10 | Discharge: 2020-07-10 | Disposition: A | Discharge status: Home / Self Care | Payer: Medicare HMO | Source: Ambulatory Visit | Attending: Thoracic Surgery (Cardiothoracic Vascular Surgery) | Admitting: Thoracic Surgery (Cardiothoracic Vascular Surgery)

## 2020-07-10 ENCOUNTER — Other Ambulatory Visit (HOSPITAL_COMMUNITY)
Admission: RE | Admit: 2020-07-10 | Discharge: 2020-07-10 | Disposition: A | Discharge status: Home / Self Care | Payer: Medicare HMO | Source: Ambulatory Visit | Attending: Thoracic Surgery (Cardiothoracic Vascular Surgery) | Admitting: Thoracic Surgery (Cardiothoracic Vascular Surgery)

## 2020-07-10 DIAGNOSIS — Z01818 Encounter for other preprocedural examination: Secondary | ICD-10-CM | POA: Insufficient documentation

## 2020-07-10 DIAGNOSIS — R042 Hemoptysis: Secondary | ICD-10-CM | POA: Insufficient documentation

## 2020-07-10 DIAGNOSIS — Z20822 Contact with and (suspected) exposure to covid-19: Secondary | ICD-10-CM | POA: Diagnosis not present

## 2020-07-10 HISTORY — DX: Dyspnea, unspecified: R06.00

## 2020-07-10 LAB — COMPREHENSIVE METABOLIC PANEL
ALT: 21 U/L (ref 0–44)
AST: 35 U/L (ref 15–41)
Albumin: 3.7 g/dL (ref 3.5–5.0)
Alkaline Phosphatase: 65 U/L (ref 38–126)
Anion gap: 9 (ref 5–15)
BUN: 18 mg/dL (ref 8–23)
CO2: 25 mmol/L (ref 22–32)
Calcium: 9.3 mg/dL (ref 8.9–10.3)
Chloride: 105 mmol/L (ref 98–111)
Creatinine, Ser: 0.86 mg/dL (ref 0.61–1.24)
GFR, Estimated: >60 mL/min (ref >60)
Glucose, Bld: 139 mg/dL — ABNORMAL HIGH (ref 70–99)
Potassium: 4.2 mmol/L (ref 3.5–5.1)
Sodium: 139 mmol/L (ref 135–145)
Total Bilirubin: 0.6 mg/dL (ref 0.3–1.2)
Total Protein: 6.8 g/dL (ref 6.5–8.1)

## 2020-07-10 LAB — CBC
HCT: 43.8 % (ref 39.0–52.0)
Hemoglobin: 14.3 g/dL (ref 13.0–17.0)
MCH: 31.4 pg (ref 26.0–34.0)
MCHC: 32.6 g/dL (ref 30.0–36.0)
MCV: 96.3 fL (ref 80.0–100.0)
Platelets: 225 10*3/uL (ref 150–400)
RBC: 4.55 MIL/uL (ref 4.22–5.81)
RDW: 12.9 % (ref 11.5–15.5)
WBC: 6.8 10*3/uL (ref 4.0–10.5)
nRBC: 0 % (ref 0.0–0.2)

## 2020-07-10 LAB — PROTIME-INR
INR: 1.1 (ref 0.8–1.2)
Prothrombin Time: 13.6 seconds (ref 11.4–15.2)

## 2020-07-10 LAB — SURGICAL PCR SCREEN
MRSA, PCR: NEGATIVE
Staphylococcus aureus: NEGATIVE

## 2020-07-10 LAB — SARS CORONAVIRUS 2 (TAT 6-24 HRS): SARS Coronavirus 2: NEGATIVE

## 2020-07-10 LAB — APTT: aPTT: 29 seconds (ref 24–36)

## 2020-07-10 NOTE — Progress Notes (Signed)
PCP:  Penni Homans, MD Cardiologist:  Jenkins Rouge, MD  EKG:  09/28/19 CXR:  07/10/20 ECHO:  09/29/19 Stress Test: Denies Cardiac Cath:  Denies  Fasting Blood Sugar-  N/A Checks Blood Sugar_N/A__ times a day  Sleep Apnea/CPAP:  No  ASA/Blood Thinners:  NO  Covid test 07/10/20  Patient denies shortness of breath, fever, cough, and chest pain at PAT appointment.  Patient verbalized understanding of instructions provided today at the PAT appointment.  Patient asked to review instructions at home and day of surgery.

## 2020-07-10 NOTE — Progress Notes (Signed)
[place], [address] [place] [address] Phone: [phone] Fax: [phone]  [place], [address] [place] [address] Phone: [phone] Fax: [phone]      Your procedure is scheduled on 07/12/20.  Report to [facility] Main Entrance "A" at 10:30 A.M., and check in at the Admitting office.  Call this number if you have problems the morning of surgery:  [phone]  Call [phone] if you have any questions prior to your surgery date Monday-Friday 8am-4pm    Remember:  Do not eat or drink after midnight the night before your surgery    Take these medicines the morning of surgery with A SIP OF WATER:  amLODipine (NORVASC) fluticasone (FLONASE)  loratadine (CLARITIN)  montelukast (SINGULAIR)  TRELEGY ELLIPTA  If Needed: acetaminophen (TYLENOL)  albuterol (VENTOLIN HFA)  As of today, STOP taking any Aspirin (unless otherwise instructed by your surgeon), Mobic,  Aleve, Naproxen, Ibuprofen, Motrin, Advil, Goody's, BC's, all herbal medications, fish oil, and all vitamins.                      Do not wear jewelry            Do not wear lotions, powders, colognes, or deodorant.            Do not shave 48 hours prior to surgery.  Men may shave face and neck.            Do not bring valuables to the hospital.            [facility] is not responsible for any belongings or valuables.  Do NOT Smoke (Tobacco/Vaping) or drink Alcohol 24 hours prior to your procedure If you use a CPAP at night, you may bring all equipment for your overnight stay.   Contacts, glasses, dentures or bridgework may not be worn into surgery.      For patients admitted to the hospital, discharge time will be determined by your treatment team.   Patients discharged the day of surgery will not be allowed to drive home, and someone needs to stay with them for 24 hours.    Special  instructions:   [place]- Preparing For Surgery  Before surgery, you can play an important role. Because skin is not sterile, your skin needs to be as free of germs as possible. You can reduce the number of germs on your skin by washing with CHG (chlorahexidine gluconate) Soap before surgery.  CHG is an antiseptic cleaner which kills germs and bonds with the skin to continue killing germs even after washing.    Oral Hygiene is also important to reduce your risk of infection.  Remember - BRUSH YOUR TEETH THE MORNING OF SURGERY WITH YOUR REGULAR TOOTHPASTE  Please do not use if you have an allergy to CHG or antibacterial soaps. If your skin becomes reddened/irritated stop using the CHG.  Do not shave (including legs and underarms) for at least 48 hours prior to first CHG shower. It is OK to shave your face.  Please follow these instructions carefully.   1. Shower the NIGHT BEFORE SURGERY and the MORNING OF SURGERY with CHG Soap.   2. If you chose to wash your hair, wash your hair first as usual with your normal shampoo.  3. After you shampoo, rinse your hair and body thoroughly to remove the shampoo.  4. Use  CHG as you would any other liquid soap. You can apply CHG directly to the skin and wash gently with a scrungie or a clean washcloth.   5. Apply the CHG Soap to your body ONLY FROM THE NECK DOWN.  Do not use on open wounds or open sores. Avoid contact with your eyes, ears, mouth and genitals (private parts). Wash Face and genitals (private parts)  with your normal soap.   6. Wash thoroughly, paying special attention to the area where your surgery will be performed.  7. Thoroughly rinse your body with warm water from the neck down.  8. DO NOT shower/wash with your normal soap after using and rinsing off the CHG Soap.  9. Pat yourself dry with a CLEAN TOWEL.  10. Wear CLEAN PAJAMAS to bed the night before surgery  11. Place CLEAN SHEETS on your bed the night of your first shower and  DO NOT SLEEP WITH PETS.   Day of Surgery: Wear Clean/Comfortable clothing the morning of surgery Do not apply any deodorants/lotions.   Remember to brush your teeth WITH YOUR REGULAR TOOTHPASTE.   Please read over the following fact sheets that you were given.

## 2020-07-11 NOTE — Anesthesia Preprocedure Evaluation (Addendum)
Anesthesia Evaluation  Patient identified by MRN, date of birth, ID band Patient awake    Reviewed: Allergy & Precautions, NPO status , Patient's Chart, lab work & pertinent test results  Airway Mallampati: II  TM Distance: >3 FB Neck ROM: Full    Dental  (+) Edentulous Upper, Edentulous Lower   Pulmonary shortness of breath, COPD,  COPD inhaler, former smoker,    Pulmonary exam normal breath sounds clear to auscultation       Cardiovascular hypertension, Pt. on medications Normal cardiovascular exam+ dysrhythmias (RBBB)  Rhythm:Regular Rate:Normal     Neuro/Psych negative neurological ROS     GI/Hepatic Neg liver ROS, GERD  Medicated,  Endo/Other  negative endocrine ROS  Renal/GU negative Renal ROS     Musculoskeletal  (+) Arthritis ,   Abdominal   Peds  Hematology negative hematology ROS (+)   Anesthesia Other Findings Day of surgery medications reviewed with the patient.  Reproductive/Obstetrics                            Anesthesia Physical Anesthesia Plan  ASA: III  Anesthesia Plan: General   Post-op Pain Management:    Induction: Intravenous  PONV Risk Score and Plan: 2 and Dexamethasone, Ondansetron and TIVA  Airway Management Planned: Oral ETT  Additional Equipment:   Intra-op Plan:   Post-operative Plan: Extubation in OR  Informed Consent: I have reviewed the patients History and Physical, chart, labs and discussed the procedure including the risks, benefits and alternatives for the proposed anesthesia with the patient or authorized representative who has indicated his/her understanding and acceptance.       Plan Discussed with: CRNA  Anesthesia Plan Comments: (PAT note by Karoline Caldwell, PA-C: History of COPD, lung abscess in 1996 s/p RML/RUL resection, atypical chest pain, HTN and PVCs. Follows yearly with cardiologist Dr. Johnsie Cancel HTN and PVCs. Las seen 09/28/19,  stable at that time. Echo was updated which showed EF 50-55%, frequent, ectopy, normal valves.  Follows with pulmonology for COPD Gold II. Quit smoking 2002. Maintained on Trelegy and PRN albuterol. Recently reported scant hemoptysis and has been referred to ENT as well as Dr. Roxan Hockey for bronchoscopy.   Preop labs reviewed, unremarkable.  EKG 09/28/2019: Sinus rhythm rate 83.  PVCs.  Incomplete right bundle branch block.  TTE 09/29/19 (care everywhere): 1. Left ventricular ejection fraction, by visual estimation, is 50 to  55%. The left ventricle has normal function. There is no left ventricular  hypertrophy.  2. The left ventricle has no regional wall motion abnormalities.  3. Abnormal septal motion Frequent ectopy during exam.  4. Global right ventricle has mildly reduced systolic function.The right  ventricular size is mildly enlarged. No increase in right ventricular wall  thickness.  5. Left atrial size was normal.  6. Right atrial size was mildly dilated.  7. Mild mitral annular calcification.  8. The mitral valve is normal in structure. Trivial mitral valve  regurgitation. No evidence of mitral stenosis.  9. The tricuspid valve is normal in structure.  10. The tricuspid valve is normal in structure. Tricuspid valve  regurgitation is not demonstrated.  11. The aortic valve is tricuspid. Aortic valve regurgitation is not  visualized. No evidence of aortic valve sclerosis or stenosis.  12. The pulmonic valve was normal in structure. Pulmonic valve  regurgitation is not visualized.  13. The inferior vena cava is normal in size with greater than 50%  respiratory variability, suggesting right atrial  pressure of 3 mmHg.   )       Anesthesia Quick Evaluation

## 2020-07-11 NOTE — Progress Notes (Signed)
Anesthesia Chart Review:  History of COPD, lung abscess in 1996 s/p RML/RUL resection, atypical chest pain, HTN and PVCs. Follows yearly with cardiologist Dr. Johnsie Cancel HTN and PVCs. Las seen 09/28/19, stable at that time. Echo was updated which showed EF 50-55%, frequent, ectopy, normal valves.  Follows with pulmonology for COPD Gold II. Quit smoking 2002. Maintained on Trelegy and PRN albuterol. Recently reported scant hemoptysis and has been referred to ENT as well as Dr. Roxan Hockey for bronchoscopy.   Preop labs reviewed, unremarkable.  EKG 09/28/2019: Sinus rhythm rate 83.  PVCs.  Incomplete right bundle branch block.  TTE 09/29/19 (care everywhere): 1. Left ventricular ejection fraction, by visual estimation, is 50 to  55%. The left ventricle has normal function. There is no left ventricular  hypertrophy.  2. The left ventricle has no regional wall motion abnormalities.  3. Abnormal septal motion Frequent ectopy during exam.  4. Global right ventricle has mildly reduced systolic function.The right  ventricular size is mildly enlarged. No increase in right ventricular wall  thickness.  5. Left atrial size was normal.  6. Right atrial size was mildly dilated.  7. Mild mitral annular calcification.  8. The mitral valve is normal in structure. Trivial mitral valve  regurgitation. No evidence of mitral stenosis.  9. The tricuspid valve is normal in structure.  10. The tricuspid valve is normal in structure. Tricuspid valve  regurgitation is not demonstrated.  11. The aortic valve is tricuspid. Aortic valve regurgitation is not  visualized. No evidence of aortic valve sclerosis or stenosis.  12. The pulmonic valve was normal in structure. Pulmonic valve  regurgitation is not visualized.  13. The inferior vena cava is normal in size with greater than 50%  respiratory variability, suggesting right atrial pressure of 3 mmHg.    Wynonia Musty [facility] Short Stay  Center/Anesthesiology Phone [phone] 07/11/2020 9:25 AM

## 2020-07-12 ENCOUNTER — Encounter (HOSPITAL_COMMUNITY): Payer: Self-pay | Admitting: Thoracic Surgery (Cardiothoracic Vascular Surgery)

## 2020-07-12 ENCOUNTER — Ambulatory Visit (HOSPITAL_COMMUNITY): Payer: Medicare HMO | Admitting: Physician Assistant

## 2020-07-12 ENCOUNTER — Other Ambulatory Visit: Payer: Self-pay

## 2020-07-12 ENCOUNTER — Encounter (HOSPITAL_COMMUNITY)
Admission: RE | Disposition: A | Discharge status: Home / Self Care | Payer: Self-pay | Source: Home / Self Care | Attending: Thoracic Surgery (Cardiothoracic Vascular Surgery)

## 2020-07-12 ENCOUNTER — Ambulatory Visit (HOSPITAL_COMMUNITY): Payer: Medicare HMO

## 2020-07-12 ENCOUNTER — Ambulatory Visit (HOSPITAL_COMMUNITY)
Admission: RE | Admit: 2020-07-12 | Discharge: 2020-07-12 | Disposition: A | Discharge status: Home / Self Care | Payer: Medicare HMO | Attending: Thoracic Surgery (Cardiothoracic Vascular Surgery) | Admitting: Thoracic Surgery (Cardiothoracic Vascular Surgery)

## 2020-07-12 ENCOUNTER — Ambulatory Visit (HOSPITAL_COMMUNITY): Payer: Medicare HMO | Admitting: Certified Registered Nurse Anesthetist

## 2020-07-12 DIAGNOSIS — Z902 Acquired absence of lung [part of]: Secondary | ICD-10-CM | POA: Insufficient documentation

## 2020-07-12 DIAGNOSIS — R911 Solitary pulmonary nodule: Secondary | ICD-10-CM | POA: Diagnosis not present

## 2020-07-12 DIAGNOSIS — J441 Chronic obstructive pulmonary disease with (acute) exacerbation: Secondary | ICD-10-CM | POA: Diagnosis not present

## 2020-07-12 DIAGNOSIS — R042 Hemoptysis: Secondary | ICD-10-CM

## 2020-07-12 DIAGNOSIS — Z419 Encounter for procedure for purposes other than remedying health state, unspecified: Secondary | ICD-10-CM

## 2020-07-12 DIAGNOSIS — Z87891 Personal history of nicotine dependence: Secondary | ICD-10-CM | POA: Insufficient documentation

## 2020-07-12 DIAGNOSIS — E785 Hyperlipidemia, unspecified: Secondary | ICD-10-CM | POA: Diagnosis not present

## 2020-07-12 HISTORY — PX: VIDEO BRONCHOSCOPY WITH ENDOBRONCHIAL NAVIGATION: SHX6175

## 2020-07-12 HISTORY — PX: VIDEO BRONCHOSCOPY: SHX5072

## 2020-07-12 SURGERY — BRONCHOSCOPY, VIDEO-ASSISTED
Anesthesia: General | Site: Chest

## 2020-07-12 MED ORDER — 0.9 % SODIUM CHLORIDE (POUR BTL) OPTIME
TOPICAL | Status: DC | PRN
  Administered 2020-07-12: 250 mL

## 2020-07-12 MED ORDER — CHLORHEXIDINE GLUCONATE 0.12 % MT SOLN
15.0000 mL | Freq: Once | OROMUCOSAL | Status: AC
  Administered 2020-07-12: 15 mL via OROMUCOSAL
  Filled 2020-07-12: qty 15

## 2020-07-12 MED ORDER — LIDOCAINE 2% (20 MG/ML) 5 ML SYRINGE
INTRAMUSCULAR | Status: AC
  Filled 2020-07-12: qty 10

## 2020-07-12 MED ORDER — SUGAMMADEX SODIUM 200 MG/2ML IV SOLN
INTRAVENOUS | Status: DC | PRN
  Administered 2020-07-12: 200 mg via INTRAVENOUS

## 2020-07-12 MED ORDER — FENTANYL CITRATE (PF) 100 MCG/2ML IJ SOLN: 25.0000 ug | INTRAMUSCULAR | Status: DC | PRN

## 2020-07-12 MED ORDER — MIDAZOLAM HCL 2 MG/2ML IJ SOLN
INTRAMUSCULAR | Status: AC
  Filled 2020-07-12: qty 2

## 2020-07-12 MED ORDER — LIDOCAINE 2% (20 MG/ML) 5 ML SYRINGE
INTRAMUSCULAR | Status: DC | PRN
  Administered 2020-07-12: 60 mg via INTRAVENOUS

## 2020-07-12 MED ORDER — SODIUM CHLORIDE (PF) 0.9 % IJ SOLN
INTRAMUSCULAR | Status: AC
  Filled 2020-07-12: qty 20

## 2020-07-12 MED ORDER — PROPOFOL 10 MG/ML IV BOLUS
INTRAVENOUS | Status: AC
  Filled 2020-07-12: qty 40

## 2020-07-12 MED ORDER — PROPOFOL 10 MG/ML IV BOLUS
INTRAVENOUS | Status: DC | PRN
  Administered 2020-07-12: 140 mg via INTRAVENOUS

## 2020-07-12 MED ORDER — DEXAMETHASONE SODIUM PHOSPHATE 10 MG/ML IJ SOLN
INTRAMUSCULAR | Status: DC | PRN
  Administered 2020-07-12: 10 mg via INTRAVENOUS

## 2020-07-12 MED ORDER — FENTANYL CITRATE (PF) 250 MCG/5ML IJ SOLN
INTRAMUSCULAR | Status: AC
  Filled 2020-07-12: qty 5

## 2020-07-12 MED ORDER — EPINEPHRINE PF 1 MG/ML IJ SOLN
INTRAMUSCULAR | Status: AC
  Filled 2020-07-12: qty 1

## 2020-07-12 MED ORDER — ONDANSETRON HCL 4 MG/2ML IJ SOLN
INTRAMUSCULAR | Status: DC | PRN
  Administered 2020-07-12: 4 mg via INTRAVENOUS

## 2020-07-12 MED ORDER — LIDOCAINE HCL 4 % MT SOLN
OROMUCOSAL | Status: DC | PRN
  Administered 2020-07-12: 2 mL via TOPICAL

## 2020-07-12 MED ORDER — ROCURONIUM BROMIDE 10 MG/ML (PF) SYRINGE
PREFILLED_SYRINGE | INTRAVENOUS | Status: DC | PRN
  Administered 2020-07-12: 20 mg via INTRAVENOUS
  Administered 2020-07-12: 60 mg via INTRAVENOUS

## 2020-07-12 MED ORDER — ROCURONIUM BROMIDE 10 MG/ML (PF) SYRINGE
PREFILLED_SYRINGE | INTRAVENOUS | Status: AC
  Filled 2020-07-12: qty 10

## 2020-07-12 MED ORDER — PHENYLEPHRINE HCL-NACL 10-0.9 MG/250ML-% IV SOLN
INTRAVENOUS | Status: DC | PRN
  Administered 2020-07-12: 25 ug/min via INTRAVENOUS

## 2020-07-12 MED ORDER — SODIUM CHLORIDE 0.9 % IR SOLN
Status: DC | PRN
  Administered 2020-07-12: 1 mL

## 2020-07-12 MED ORDER — ORAL CARE MOUTH RINSE: 15.0000 mL | Freq: Once | OROMUCOSAL | Status: AC

## 2020-07-12 MED ORDER — PHENYLEPHRINE 40 MCG/ML (10ML) SYRINGE FOR IV PUSH (FOR BLOOD PRESSURE SUPPORT)
PREFILLED_SYRINGE | INTRAVENOUS | Status: AC
  Filled 2020-07-12: qty 10

## 2020-07-12 MED ORDER — ONDANSETRON HCL 4 MG/2ML IJ SOLN: 4.0000 mg | Freq: Once | INTRAMUSCULAR | Status: DC | PRN

## 2020-07-12 MED ORDER — FENTANYL CITRATE (PF) 250 MCG/5ML IJ SOLN
INTRAMUSCULAR | Status: DC | PRN
  Administered 2020-07-12: 50 ug via INTRAVENOUS
  Administered 2020-07-12: 100 ug via INTRAVENOUS

## 2020-07-12 MED ORDER — LACTATED RINGERS IV SOLN: INTRAVENOUS | Status: DC

## 2020-07-12 SURGICAL SUPPLY — 73 items
ADAPTER BRONCHOSCOPE OLYMP 190 (ADAPTER) ×4 IMPLANT
ADAPTER BRONCHOSCOPE OLYMPUS (ADAPTER) ×4 IMPLANT
ADAPTER CATH SYR TO TUBING 38M (ADAPTER) ×4 IMPLANT
ADAPTER VALVE BIOPSY EBUS (MISCELLANEOUS) IMPLANT
ADPR BSCP OLMPS EDG (ADAPTER) ×2
ADPR BSCP STRL LF REUSE (ADAPTER) ×2
ADPR CATH LL SYR 3/32 TPR (ADAPTER) ×2
ADPTR VALVE BIOPSY EBUS (MISCELLANEOUS)
BLADE CLIPPER SURG (BLADE) ×4 IMPLANT
BNDG EYE OVAL (GAUZE/BANDAGES/DRESSINGS) ×8 IMPLANT
BNDG GAUZE ELAST 4 BULKY (GAUZE/BANDAGES/DRESSINGS) IMPLANT
BRUSH BIOPSY BRONCH 10 SDTNB (MISCELLANEOUS) IMPLANT
BRUSH BIOPSY BRONCH 10MM SDTNB (MISCELLANEOUS)
BRUSH CYTOL CELLEBRITY 1.5X140 (MISCELLANEOUS) IMPLANT
BRUSH SUPERTRAX BIOPSY (INSTRUMENTS) IMPLANT
BRUSH SUPERTRAX NDL-TIP CYTO (INSTRUMENTS) ×4 IMPLANT
CANISTER SUCT 3000ML PPV (MISCELLANEOUS) ×4 IMPLANT
CNTNR URN SCR LID CUP LEK RST (MISCELLANEOUS) ×10 IMPLANT
CONT SPEC 4OZ STRL OR WHT (MISCELLANEOUS) ×20
COVER BACK TABLE 60X90IN (DRAPES) ×4 IMPLANT
DRAPE INCISE IOBAN 66X45 STRL (DRAPES) IMPLANT
FILTER STRAW FLUID ASPIR (MISCELLANEOUS) ×4 IMPLANT
FORCEPS BIOP RJ4 1.8 (CUTTING FORCEPS) IMPLANT
FORCEPS BIOP SUPERTRX PREMAR (INSTRUMENTS) IMPLANT
FORCEPS RADIAL JAW LRG 4 PULM (INSTRUMENTS) IMPLANT
GAS CARTRIDGE  LASER (MISCELLANEOUS) ×4 IMPLANT
GAUZE SPONGE 4X4 12PLY STRL (GAUZE/BANDAGES/DRESSINGS) ×8 IMPLANT
GAUZE VASELINE FOILPK 1/2 X 72 (GAUZE/BANDAGES/DRESSINGS) IMPLANT
GLOVE BIO SURGEON STRL SZ 6.5 (GLOVE) ×3 IMPLANT
GLOVE BIO SURGEONS STRL SZ 6.5 (GLOVE) ×1
GLOVE SURG SIGNA 7.5 PF LTX (GLOVE) ×4 IMPLANT
GLOVE SURG SYN 7.5  E (GLOVE) ×4
GLOVE SURG SYN 7.5 E (GLOVE) ×2 IMPLANT
GOWN STRL REUS W/ TWL LRG LVL3 (GOWN DISPOSABLE) ×2 IMPLANT
GOWN STRL REUS W/ TWL XL LVL3 (GOWN DISPOSABLE) ×2 IMPLANT
GOWN STRL REUS W/TWL LRG LVL3 (GOWN DISPOSABLE) ×4
GOWN STRL REUS W/TWL XL LVL3 (GOWN DISPOSABLE) ×4
GUARD TEETH (MISCELLANEOUS) IMPLANT
KIT BASIN OR (CUSTOM PROCEDURE TRAY) ×4 IMPLANT
KIT CLEAN ENDO COMPLIANCE (KITS) ×4 IMPLANT
KIT ILLUMISITE 180 PROCEDURE (KITS) ×4 IMPLANT
KIT ILLUMISITE 90 PROCEDURE (KITS) IMPLANT
KIT TURNOVER KIT B (KITS) ×4 IMPLANT
LASER FIBER FLEXIBLE (MISCELLANEOUS) IMPLANT
MARKER SKIN DUAL TIP RULER LAB (MISCELLANEOUS) ×4 IMPLANT
NEEDLE BLUNT 16X1.5 OR ONLY (NEEDLE) ×4 IMPLANT
NEEDLE SUPERTRX PREMARK BIOPSY (NEEDLE) IMPLANT
NS IRRIG 1000ML POUR BTL (IV SOLUTION) ×8 IMPLANT
OIL SILICONE PENTAX (PARTS (SERVICE/REPAIRS)) ×4 IMPLANT
PAD ARMBOARD 7.5X6 YLW CONV (MISCELLANEOUS) ×8 IMPLANT
PATCHES PATIENT (LABEL) ×12 IMPLANT
RADIAL JAW LRG 4 PULMONARY (INSTRUMENTS)
SNARE SHORT THROW 13M SML OVAL (MISCELLANEOUS) IMPLANT
SOL ANTI FOG 6CC (MISCELLANEOUS) ×2 IMPLANT
SOLUTION ANTI FOG 6CC (MISCELLANEOUS) ×2
SYR 20ML ECCENTRIC (SYRINGE) ×12 IMPLANT
SYR 20ML LL LF (SYRINGE) ×4 IMPLANT
SYR 30ML LL (SYRINGE) ×4 IMPLANT
SYR 3ML LL SCALE MARK (SYRINGE) ×4 IMPLANT
SYR 5ML LL (SYRINGE) ×4 IMPLANT
SYR 5ML LUER SLIP (SYRINGE) ×4 IMPLANT
SYR BULB IRRIG 60ML STRL (SYRINGE) ×4 IMPLANT
SYR TOOMEY 50ML (SYRINGE) IMPLANT
TOWEL GREEN STERILE (TOWEL DISPOSABLE) ×4 IMPLANT
TOWEL GREEN STERILE FF (TOWEL DISPOSABLE) ×4 IMPLANT
TRAP SPECIMEN MUCUS 40CC (MISCELLANEOUS) ×4 IMPLANT
TUBE CONNECTING 20'X1/4 (TUBING) ×2
TUBE CONNECTING 20X1/4 (TUBING) ×6 IMPLANT
UNDERPAD 30X36 HEAVY ABSORB (UNDERPADS AND DIAPERS) ×4 IMPLANT
VALVE BIOPSY  SINGLE USE (MISCELLANEOUS) ×8
VALVE BIOPSY SINGLE USE (MISCELLANEOUS) ×4 IMPLANT
VALVE SUCTION BRONCHIO DISP (MISCELLANEOUS) ×8 IMPLANT
WATER STERILE IRR 1000ML POUR (IV SOLUTION) ×4 IMPLANT

## 2020-07-12 NOTE — Brief Op Note (Signed)
07/12/2020  2:42 PM  PATIENT:  [name]  74 y.o. male  PRE-OPERATIVE DIAGNOSIS:  HEMOPTYSIS PRIOR LOBECTOMY  POST-OPERATIVE DIAGNOSIS:  HEMOPTYSIS PRIOR LOBECTOMY  PROCEDURE:  Procedure(s): VIDEO BRONCHOSCOPY (N/A) VIDEO BRONCHOSCOPY WITH ENDOBRONCHIAL NAVIGATION (N/A) Bronchoalveolar lavage  SURGEON:  Surgeon(s) and Role:    * [name], MD - Primary  PHYSICIAN ASSISTANT:   ASSISTANTS: none   ANESTHESIA:   general  EBL:  minimal  BLOOD ADMINISTERED:none  DRAINS: none   LOCAL MEDICATIONS USED:  NONE  SPECIMEN:  Source of Specimen:  BAL-   DISPOSITION OF SPECIMEN:  Path and micro  COUNTS:  NO endoscopic  TOURNIQUET:  * No tourniquets in log *  DICTATION: .Other Dictation: Dictation Number -  PLAN OF CARE: Discharge to home after PACU  PATIENT DISPOSITION:  PACU - hemodynamically stable.   Delay start of Pharmacological VTE agent (>24hrs) due to surgical blood loss or risk of bleeding: not applicable

## 2020-07-12 NOTE — Transfer of Care (Signed)
Immediate Anesthesia Transfer of Care Note  Patient: [name]  Procedure(s) Performed: VIDEO BRONCHOSCOPY (N/A Chest) VIDEO BRONCHOSCOPY WITH ENDOBRONCHIAL NAVIGATION (N/A Chest)  Patient Location: PACU  Anesthesia Type:General  Level of Consciousness: drowsy and patient cooperative  Airway & Oxygen Therapy: Patient Spontanous Breathing and Patient connected to face mask oxygen  Post-op Assessment: Report given to RN, Post -op Vital signs reviewed and stable and Patient moving all extremities X 4  Post vital signs: Reviewed and stable  Last Vitals:  Vitals Value Taken Time  BP 144/83 07/12/20 1430  Temp    Pulse 86 07/12/20 1432  Resp 13 07/12/20 1432  SpO2 100 % 07/12/20 1432  Vitals shown include unvalidated device data.  Last Pain:  Vitals:   07/12/20 1045  TempSrc:   PainSc: 0-No pain         Complications: No complications documented.

## 2020-07-12 NOTE — Discharge Instructions (Signed)
Do not drive for 24 hours.  You may resume normal activities tomorrow  You may cough up small amounts of blood over the next few days  You may use acetaminophen (Tylenol) if needed for discomfort. You may use an over the counter cough medication if needed.  Call [phone] if you develop chest pain, shortness of breath, or cough up more than 2 tablespoons of blood.  My office will contact you with follow up information

## 2020-07-12 NOTE — Interval H&P Note (Signed)
History and Physical Interval Note:  07/12/2020 12:27 PM  [name]  has presented today for surgery, with the diagnosis of [place].  The various methods of treatment have been discussed with the patient and family. After consideration of risks, benefits and other options for treatment, the patient has consented to  Procedure(s): VIDEO BRONCHOSCOPY (N/A) LASER BRONCHOSCOPY (N/A) POSSIBLE VIDEO BRONCHOSCOPY WITH ENDOBRONCHIAL NAVIGATION (N/A) as a surgical intervention.  The patient's history has been reviewed, patient examined, no change in status, stable for surgery.  I have reviewed the patient's chart and labs.  Questions were answered to the patient's satisfaction.     [name]

## 2020-07-12 NOTE — Anesthesia Procedure Notes (Signed)
Procedure Name: Intubation Date/Time: 07/12/2020 1:09 PM Performed by: Colin Benton, CRNA Pre-anesthesia Checklist: Patient identified, Emergency Drugs available, Suction available and Patient being monitored Patient Re-evaluated:Patient Re-evaluated prior to induction Oxygen Delivery Method: Circle system utilized Preoxygenation: Pre-oxygenation with 100% oxygen Induction Type: IV induction Ventilation: Mask ventilation without difficulty and Oral airway inserted - appropriate to patient size Laryngoscope Size: Glidescope and 4 Grade View: Grade I Tube type: Oral Laser Tube: Laser Tube and Cuffed inflated with minimal occlusive pressure - saline Tube size: 8.0 mm Number of attempts: 1 Airway Equipment and Method: Oral airway,  Rigid stylet and Video-laryngoscopy Placement Confirmation: ETT inserted through vocal cords under direct vision,  positive ETCO2 and breath sounds checked- equal and bilateral Secured at: 23 cm Tube secured with: Tape Dental Injury: Teeth and Oropharynx as per pre-operative assessment

## 2020-07-13 ENCOUNTER — Encounter (HOSPITAL_COMMUNITY): Payer: Self-pay | Admitting: Thoracic Surgery (Cardiothoracic Vascular Surgery)

## 2020-07-13 LAB — ACID FAST SMEAR (AFB, MYCOBACTERIA): Acid Fast Smear: NEGATIVE

## 2020-07-13 LAB — CYTOLOGY - NON PAP

## 2020-07-13 NOTE — Anesthesia Postprocedure Evaluation (Signed)
Anesthesia Post Note  Patient: LUZ BURCHER  Procedure(s) Performed: VIDEO BRONCHOSCOPY (N/A Chest) VIDEO BRONCHOSCOPY WITH ENDOBRONCHIAL NAVIGATION (N/A Chest)     Patient location during evaluation: PACU Anesthesia Type: General Level of consciousness: awake and alert Pain management: pain level controlled Vital Signs Assessment: post-procedure vital signs reviewed and stable Respiratory status: spontaneous breathing, nonlabored ventilation, respiratory function stable and patient connected to nasal cannula oxygen Cardiovascular status: blood pressure returned to baseline and stable Postop Assessment: no apparent nausea or vomiting Anesthetic complications: no   No complications documented.  Last Vitals:  Vitals:   07/12/20 1515 07/12/20 1520  BP: (!) 120/59 129/80  Pulse: 89 85  Resp: (!) 27 15  Temp: 36.7 C 36.9 C  SpO2: 90% 96%    Last Pain:  Vitals:   07/12/20 1515  TempSrc:   PainSc: 0-No pain                 Catalina Gravel

## 2020-07-13 NOTE — Op Note (Signed)
NAMECAVON, [name] MEDICAL RECORD [id number] ACCOUNT [id number] DATE OF BIRTH:1945-11-06 FACILITY: [facility] LOCATION: [facility] PHYSICIAN:Channon Brougher Chaya Jan, MD  OPERATIVE REPORT  DATE OF PROCEDURE:  07/12/2020  PREOPERATIVE DIAGNOSIS:  Hemoptysis in setting of prior bilobectomy.  POSTOPERATIVE DIAGNOSIS:  Hemoptysis in setting of prior bilobectomy.  PROCEDURE:  Video bronchoscopy with attempted endobronchial electromagnetic navigational bronchoscopy, bronchoalveolar lavage.  SURGEON:  Modesto Charon, MD  ASSISTANT:  None.  ANESTHESIA:  General.  FINDINGS:  Markedly distorted tracheal and right-sided airway anatomy, no endobronchial lesions.  Bronchial stump was well healed.  No bleeding source identified.  Unable to successfully navigate to a site appropriate for biopsying the peripheral right  upper lobe lesion.  CLINICAL NOTE:  [name] is a 74 year old man with a history of right upper and middle lobectomy in 1996 and remote tobacco abuse, he presented with about a 26-monthhistory of sporadic small volume hemoptysis.  He had a CT of the chest in September,  which showed a cystic area superiorly.  It was unclear if this was pleural based or in the lung itself.  On repeat CT in late October, there was a slight interval enlargement of the cavitary lesion.  Given his persistent hemoptysis and previous lung  surgery, he was advised to undergo bronchoscopy with attempt for navigational bronchoscopy to try to biopsy the peripheral lesion if possible.  The indications, risks, benefits, and alternatives were discussed in detail with the patient.  He understood  and accepted the risks and agreed to proceed.  DESCRIPTION OF PROCEDURE:  Mr. NGehretwas brought to the operating room on 07/12/2020.  He had induction of general anesthesia and was intubated with an 8 mm laser bronchoscopy tube.  A timeout was performed.  Flexible fiberoptic bronchoscopy was  performed via the endotracheal  tube.  There was deformity of the trachea, but no lesions were seen in the trachea distal to the endotracheal tube.  The left main stem, left upper and lower lobe bronchi were normal to the level of the subsegmental bronchi  with no endobronchial lesions seen.  On the right side, there was marked deformity.  The upper lobe and middle lobe staple lines were intact.  The lower lobe airways were markedly deformed, but no endobronchial lesions were seen to the level of the  subsegmental bronchi.  No bleeding site was identified in the proximal tracheobronchial tree.  The locatable guide for navigation was placed and registration was performed.  Due to the abnormal right-sided airways, 2 attempts were made for registration.   It was unclear if the registration was in fact accurate.  The appropriate segmental bronchus then was cannulated.  It took multiple attempts to get the catheter to go in the appropriate direction.  The closest the catheter could be navigated to the  lesion was 2.5 cm and even with that there was poor alignment.  Given the peripheral nature of the lesion it was not felt safe to attempt to biopsies.  Fluoroscopy was used with advancement of the catheter to see if it was felt that the lesion could  be biopsied safely, but even with that, it was not felt safe to biopsy.  The total fluoroscopy time was 1.2 minutes.  Bronchoalveolar lavage was performed twice, each time 100 mL of saline was instilled and suction was applied as the catheter sheath of  the locatable guide was removed, resulting in a return of approximately 15-20 mL on both occasions.  One specimen was sent for cultures.  The remainder was  sent for cytology.  The bronchial washings also were sent for cytology.  The bronchoscope was  removed.  The patient then was extubated in the operating room and taken to the [facility] in good condition.  HN/NUANCE  D:07/12/2020 T:07/13/2020 JOB:[id number]

## 2020-07-16 ENCOUNTER — Encounter (HOSPITAL_COMMUNITY): Payer: Self-pay | Admitting: Emergency Medicine

## 2020-07-16 ENCOUNTER — Emergency Department (HOSPITAL_COMMUNITY): Payer: Medicare HMO

## 2020-07-16 ENCOUNTER — Other Ambulatory Visit: Payer: Self-pay

## 2020-07-16 ENCOUNTER — Emergency Department (HOSPITAL_COMMUNITY)
Admission: EM | Admit: 2020-07-16 | Discharge: 2020-07-16 | Disposition: A | Discharge status: Home / Self Care | Payer: Medicare HMO | Attending: Emergency Medicine | Admitting: Emergency Medicine

## 2020-07-16 DIAGNOSIS — R079 Chest pain, unspecified: Secondary | ICD-10-CM | POA: Diagnosis not present

## 2020-07-16 DIAGNOSIS — Z7951 Long term (current) use of inhaled steroids: Secondary | ICD-10-CM | POA: Insufficient documentation

## 2020-07-16 DIAGNOSIS — R509 Fever, unspecified: Secondary | ICD-10-CM | POA: Diagnosis not present

## 2020-07-16 DIAGNOSIS — Z79899 Other long term (current) drug therapy: Secondary | ICD-10-CM | POA: Insufficient documentation

## 2020-07-16 DIAGNOSIS — J111 Influenza due to unidentified influenza virus with other respiratory manifestations: Secondary | ICD-10-CM | POA: Diagnosis not present

## 2020-07-16 DIAGNOSIS — I10 Essential (primary) hypertension: Secondary | ICD-10-CM | POA: Diagnosis not present

## 2020-07-16 DIAGNOSIS — J441 Chronic obstructive pulmonary disease with (acute) exacerbation: Secondary | ICD-10-CM | POA: Insufficient documentation

## 2020-07-16 DIAGNOSIS — Z20822 Contact with and (suspected) exposure to covid-19: Secondary | ICD-10-CM | POA: Diagnosis not present

## 2020-07-16 DIAGNOSIS — R0602 Shortness of breath: Secondary | ICD-10-CM | POA: Diagnosis not present

## 2020-07-16 DIAGNOSIS — M791 Myalgia, unspecified site: Secondary | ICD-10-CM | POA: Insufficient documentation

## 2020-07-16 DIAGNOSIS — Z87891 Personal history of nicotine dependence: Secondary | ICD-10-CM | POA: Insufficient documentation

## 2020-07-16 DIAGNOSIS — J90 Pleural effusion, not elsewhere classified: Secondary | ICD-10-CM | POA: Diagnosis not present

## 2020-07-16 LAB — URINALYSIS, ROUTINE W REFLEX MICROSCOPIC
Glucose, UA: NEGATIVE mg/dL
Hgb urine dipstick: NEGATIVE
Ketones, ur: NEGATIVE mg/dL
Leukocytes,Ua: NEGATIVE
Nitrite: NEGATIVE
Protein, ur: NEGATIVE mg/dL
Specific Gravity, Urine: 1.029 (ref 1.005–1.030)
pH: 5 (ref 5.0–8.0)

## 2020-07-16 LAB — COMPREHENSIVE METABOLIC PANEL
ALT: 22 U/L (ref 0–44)
AST: 28 U/L (ref 15–41)
Albumin: 3.4 g/dL — ABNORMAL LOW (ref 3.5–5.0)
Alkaline Phosphatase: 67 U/L (ref 38–126)
Anion gap: 12 (ref 5–15)
BUN: 20 mg/dL (ref 8–23)
CO2: 23 mmol/L (ref 22–32)
Calcium: 9.3 mg/dL (ref 8.9–10.3)
Chloride: 101 mmol/L (ref 98–111)
Creatinine, Ser: 0.92 mg/dL (ref 0.61–1.24)
GFR, Estimated: >60 mL/min (ref >60)
Glucose, Bld: 171 mg/dL — ABNORMAL HIGH (ref 70–99)
Potassium: 3.7 mmol/L (ref 3.5–5.1)
Sodium: 136 mmol/L (ref 135–145)
Total Bilirubin: 1.6 mg/dL — ABNORMAL HIGH (ref 0.3–1.2)
Total Protein: 6.8 g/dL (ref 6.5–8.1)

## 2020-07-16 LAB — CBC WITH DIFFERENTIAL/PLATELET
Abs Immature Granulocytes: 0.07 10*3/uL (ref 0.00–0.07)
Basophils Absolute: 0 10*3/uL (ref 0.0–0.1)
Basophils Relative: 0 %
Eosinophils Absolute: 0.1 10*3/uL (ref 0.0–0.5)
Eosinophils Relative: 1 %
HCT: 42 % (ref 39.0–52.0)
Hemoglobin: 13.6 g/dL (ref 13.0–17.0)
Immature Granulocytes: 1 %
Lymphocytes Relative: 19 %
Lymphs Abs: 1.7 10*3/uL (ref 0.7–4.0)
MCH: 31.1 pg (ref 26.0–34.0)
MCHC: 32.4 g/dL (ref 30.0–36.0)
MCV: 95.9 fL (ref 80.0–100.0)
Monocytes Absolute: 0.8 10*3/uL (ref 0.1–1.0)
Monocytes Relative: 9 %
Neutro Abs: 6.6 10*3/uL (ref 1.7–7.7)
Neutrophils Relative %: 70 %
Platelets: 225 10*3/uL (ref 150–400)
RBC: 4.38 MIL/uL (ref 4.22–5.81)
RDW: 13 % (ref 11.5–15.5)
WBC: 9.4 10*3/uL (ref 4.0–10.5)
nRBC: 0 % (ref 0.0–0.2)

## 2020-07-16 LAB — RESPIRATORY PANEL BY RT PCR (FLU A&B, COVID)
Influenza A by PCR: NEGATIVE
Influenza B by PCR: NEGATIVE
SARS Coronavirus 2 by RT PCR: NEGATIVE

## 2020-07-16 LAB — LACTIC ACID, PLASMA: Lactic Acid, Venous: 2 mmol/L (ref 0.5–1.9)

## 2020-07-16 MED ORDER — AZITHROMYCIN 250 MG PO TABS: 250.0000 mg | ORAL_TABLET | Freq: Every day | ORAL | 0 refills | Status: DC

## 2020-07-16 MED ORDER — SODIUM CHLORIDE 0.9 % IV BOLUS
1000.0000 mL | Freq: Once | INTRAVENOUS | Status: AC
  Administered 2020-07-16: 1000 mL via INTRAVENOUS

## 2020-07-16 NOTE — ED Provider Notes (Signed)
[facility] EMERGENCY DEPARTMENT Provider Note   CSN: [id number] Arrival date & time: 07/16/20  0102     History Chief Complaint  Patient presents with  . Fever  . Generalized Body Aches    [name] is a 74 y.o. male.  The history is provided by the patient.  Fever Max temp prior to arrival:  100.7 Temp source:  Oral Onset quality:  Gradual Progression:  Resolved Chronicity:  New Relieved by:  Acetaminophen Worsened by:  Nothing Associated symptoms: chills and myalgias   Associated symptoms: no chest pain, no congestion, no cough, no diarrhea, no dysuria, no ear pain, no rash, no sore throat and no vomiting   Risk factors: no immunosuppression, no recent sickness and no sick contacts        Past Medical History:  Diagnosis Date  . Allergic rhinitis   . Atypical chest pain 12/01/2014  . Back pain 01/12/2013  . COPD (chronic obstructive pulmonary disease) (HCC)    FeV1 64%-2007  . Dyspnea   . Emphysema   . GERD (gastroesophageal reflux disease)   . History of lung abscess    bronchiectasis with RMLandRLL ersection -1996- Dr Arlyce Dice  . Hordeolum externum (stye) 04/23/2015   Right eye  . Hypertension   . Impaired vision    glasses  . Osteoarthritis    hands and knees  . Sinusitis, acute 04/23/2015    Patient Active Problem List   Diagnosis Date Noted  . Hemoptysis 06/26/2020  . Sinus congestion 06/26/2020  . Hyperlipidemia 12/23/2019  . Recurrent sinusitis 09/28/2018  . High risk medication use 09/28/2018  . Solitary pulmonary nodule on lung CT 07/06/2018  . Arthritis of left knee 12/18/2017  . Anemia 09/15/2017  . Left knee pain 09/15/2017  . Right hip pain 09/15/2017  . Shoulder pain, right 09/15/2017  . Nocturia 09/15/2017  . Hyperglycemia 09/15/2017  . Skin lesion 09/15/2017  . H/O tobacco use, presenting hazards to health 09/15/2017  . Hx of migraines 09/28/2016  . COPD exacerbation ([place]) 04/24/2015  . Sinusitis, acute,  maxillary 04/23/2015  . Atypical chest pain 12/01/2014  . Screening for ischemic heart disease 07/25/2014  . Prostate cancer screening 07/25/2014  . Preventative health care 07/25/2014  . Asymptomatic PVCs 07/25/2014  . Arthritis 10/18/2013  . Incomplete RBBB 07/19/2013  . Back pain 01/12/2013  . Annual physical exam 07/21/2011  . Carotid bruit 07/21/2011  . Benign essential HTN 05/23/2010  . Allergic rhinitis 07/07/2007  . COPD GOLD II D 07/07/2007  . GERD 07/07/2007    Past Surgical History:  Procedure Laterality Date  . HERNIA REPAIR  10/2009  . left knee    . lower back surgery  09/2008   06/2009  . LUNG SURGERY     RML andRUL removed due to bleeding and bronchiectasis and lung abcess  . NECK SURGERY  05/2008  . right foot surgery    . VIDEO BRONCHOSCOPY N/A 07/12/2020   Procedure: VIDEO BRONCHOSCOPY;  Surgeon: [name], MD;  Location: [facility];  Service: Thoracic;  Laterality: N/A;  . VIDEO BRONCHOSCOPY WITH ENDOBRONCHIAL NAVIGATION N/A 07/12/2020   Procedure: VIDEO BRONCHOSCOPY WITH ENDOBRONCHIAL NAVIGATION;  Surgeon: [name], MD;  Location: [facility] OR;  Service: Thoracic;  Laterality: N/A;       Family History  Problem Relation Age of Onset  . Prostate cancer Father        father died prostate ca  . Coronary artery disease Other  1st degree relative<60  . Stroke Other        1st degree relative<50  . Heart failure Mother        age 56  . Colon cancer Paternal Grandmother   . Obstructive Sleep Apnea Brother   . Obesity Brother     Social History   Tobacco Use  . Smoking status: Former Smoker    Packs/day: 1.50    Years: 38.00    Pack years: 57.00    Types: Cigarettes    Quit date: 09/03/2000    Years since quitting: 19.8  . Smokeless tobacco: Never Used  Substance Use Topics  . Alcohol use: Yes    Alcohol/week: 2.0 - 3.0 standard drinks    Types: 2 - 3 Cans of beer per week  . Drug use: No    Home Medications Prior to  Admission medications   Medication Sig Start Date End Date Taking? Authorizing Provider  acetaminophen (TYLENOL) 650 MG CR tablet Take 1,300 mg by mouth every 8 (eight) hours as needed for pain.    [provider]  albuterol (VENTOLIN HFA) 108 (90 Base) MCG/ACT inhaler INHALE 2 PUFFS INTO THE LUNGS EVERY 6 (SIX) HOURS AS NEEDED FOR WHEEZING OR SHORTNESS OF BREATH. 03/22/20   Mosie Lukes, MD  amLODipine (NORVASC) 5 MG tablet TAKE 1 TABLET EVERY DAY Patient taking differently: Take 5 mg by mouth daily.  07/03/20   Mosie Lukes, MD  azithromycin (ZITHROMAX) 250 MG tablet Take 1 tablet (250 mg total) by mouth daily. Take first 2 tablets together, then 1 every day until finished. 07/16/20   Jannell Franta, DO  calcium elemental as carbonate (TUMS ULTRA 1000) 400 MG chewable tablet Chew 1,000 mg by mouth 2 (two) times daily.     [provider]  Capsaicin-Menthol (SALONPAS GEL EX) Apply 1 application topically daily as needed (arthritis pain).    [provider]  diclofenac Sodium (VOLTAREN) 1 % GEL Apply 2 g topically 3 (three) times daily as needed (pain).    [provider]  fluticasone (FLONASE) 50 MCG/ACT nasal spray USE 2 SPRAYS IN EACH NOSTRIL EVERY DAY AS NEEDED FOR ALLERGIES OR RHINITIS Patient taking differently: Place 2 sprays into both nostrils daily.  01/17/20   Mosie Lukes, MD  Glucosamine-Chondroitin 750-600 MG TABS Take 1 tablet by mouth 2 (two) times daily.    [provider]  Guaifenesin (MUCINEX MAXIMUM STRENGTH) 1200 MG TB12 Take 600 mg by mouth 2 (two) times daily.    [provider]  imiquimod (ALDARA) 5 % cream Apply topically 3 (three) times a week. Patient not taking: Reported on 07/05/2020 12/24/19   Mosie Lukes, MD  loratadine (CLARITIN) 10 MG tablet Take 10 mg by mouth daily.    [provider]  meloxicam (MOBIC) 15 MG tablet Take 1 tablet (15 mg total) by mouth daily as needed for pain. Patient taking  differently: Take 15 mg by mouth daily.  03/03/20   Mosie Lukes, MD  Menthol (ROBITUSSIN COUGH DROPS MT) Use as directed 1 lozenge in the mouth or throat 4 (four) times daily as needed (cough).     [provider]  Menthol, Topical Analgesic, (BLUE-EMU MAXIMUM STRENGTH EX) Apply 1 application topically daily as needed (arthritis pain). Patient not taking: Reported on 07/06/2020    [provider]  montelukast (SINGULAIR) 10 MG tablet TAKE 1 TABLET (10 MG TOTAL) BY MOUTH DAILY. 11/08/19   Mosie Lukes, MD  Multiple Vitamin (  MULTIVITAMIN WITH MINERALS) TABS tablet Take 1 tablet by mouth daily.     [provider]  TRELEGY ELLIPTA 100-62.5-25 MCG/INH AEPB INHALE 1 PUFF INTO THE LUNGS DAILY (NEED MD APPOINTMENT) Patient taking differently: Inhale 1 puff into the lungs daily.  03/22/20   Rigoberto Noel, MD    Allergies    Losartan  Review of Systems   Review of Systems  Constitutional: Positive for chills and fever.  HENT: Negative for congestion, ear pain and sore throat.   Eyes: Negative for pain and visual disturbance.  Respiratory: Negative for cough and shortness of breath.   Cardiovascular: Negative for chest pain and palpitations.  Gastrointestinal: Negative for abdominal pain, diarrhea and vomiting.  Genitourinary: Negative for dysuria and hematuria.  Musculoskeletal: Positive for myalgias. Negative for arthralgias and back pain.  Skin: Negative for color change and rash.  Neurological: Negative for seizures and syncope.  All other systems reviewed and are negative.   Physical Exam Updated Vital Signs  ED Triage Vitals  Enc Vitals Group     BP 07/16/20 0901 (!) 170/85     Pulse Rate 07/16/20 0901 (!) 103     Resp 07/16/20 0901 16     Temp 07/16/20 0901 99 F (37.2 C)     Temp Source 07/16/20 0901 Oral     SpO2 07/16/20 0901 98 %     Weight 07/16/20 0901 150 lb (68 kg)     Height 07/16/20 0901 5\' 7"  (1.702 m)     Head Circumference --      Peak  Flow --      Pain Score 07/16/20 0907 2     Pain Loc --      Pain Edu? --      Excl. in [place]? --     Physical Exam Vitals and nursing note reviewed.  Constitutional:      General: He is not in acute distress.    Appearance: He is well-developed. He is not ill-appearing.  HENT:     Head: Normocephalic and atraumatic.     Nose: Nose normal.     Mouth/Throat:     Mouth: Mucous membranes are moist.  Eyes:     Extraocular Movements: Extraocular movements intact.     Conjunctiva/sclera: Conjunctivae normal.     Pupils: Pupils are equal, round, and reactive to light.  Cardiovascular:     Rate and Rhythm: Normal rate and regular rhythm.     Pulses: Normal pulses.     Heart sounds: Normal heart sounds. No murmur heard.   Pulmonary:     Effort: Pulmonary effort is normal. No respiratory distress.     Breath sounds: Normal breath sounds.  Abdominal:     Palpations: Abdomen is soft.     Tenderness: There is no abdominal tenderness.  Musculoskeletal:     Cervical back: Normal range of motion and neck supple.  Skin:    General: Skin is warm and dry.     Capillary Refill: Capillary refill takes less than 2 seconds.  Neurological:     General: No focal deficit present.     Mental Status: He is alert.  Psychiatric:        Mood and Affect: Mood normal.     ED Results / Procedures / Treatments   Labs (all labs ordered are listed, but only abnormal results are displayed) Labs Reviewed  LACTIC ACID, PLASMA - Abnormal; Notable for the following components:      Result Value  Lactic Acid, Venous 2.0 (*)    All other components within normal limits  COMPREHENSIVE METABOLIC PANEL - Abnormal; Notable for the following components:   Glucose, Bld 171 (*)    Albumin 3.4 (*)    Total Bilirubin 1.6 (*)    All other components within normal limits  URINALYSIS, ROUTINE W REFLEX MICROSCOPIC - Abnormal; Notable for the following components:   Bilirubin Urine MODERATE (*)    All other  components within normal limits  RESPIRATORY PANEL BY RT PCR (FLU A&B, COVID)  URINE CULTURE  CBC WITH DIFFERENTIAL/PLATELET    EKG None  Radiology DG Chest Portable 1 View  Result Date: 07/16/2020 CLINICAL DATA:  Fever with shortness of breath and chest pain. EXAM: PORTABLE CHEST 1 VIEW COMPARISON:  07/10/2020 FINDINGS: Volume loss in the right hemithorax is stable, consistent with prior right middle and upper lobectomy. Right apical pleural scarring and associated parenchymal scarring at the right base are stable. No evidence for pulmonary edema or new airspace consolidation. No pleural effusion. The cardiopericardial silhouette is within normal limits for size. The visualized bony structures of the thorax show no acute abnormality. IMPRESSION: Stable exam. Postsurgical changes in the right hemithorax without acute cardiopulmonary findings. Electronically Signed   By: Misty Stanley M.D.   On: 07/16/2020 09:53    Procedures Procedures (including critical care time)  Medications Ordered in ED Medications  sodium chloride 0.9 % bolus 1,000 mL (1,000 mLs Intravenous New Bag/Given 07/16/20 1126)    ED Course  I have reviewed the triage vital signs and the nursing notes.  Pertinent labs & imaging results that were available during my care of the patient were reviewed by me and considered in my medical decision making (see chart for details).    MDM Rules/Calculators/A&P                          [name] is a 74 year old male with history of COPD who presents to the ED with fever, body aches.  Normal vitals.  No fever.  Denies any cough or sputum production.  No dysuria.  No abdominal pain.  Feels like he has the flu.  Covid test and influenza testing are negative.  No significant anemia, electrolyte abnormality, kidney injury.  Lactic acid is 2.0.  Chest x-ray shows no infectious process.  Overall sounds like a viral process.  He is not immunosuppressed and no concern for occult  bacteremia.  Will give fluid bolus and check a urine and anticipate discharge to home.  Urinalysis unremarkable for infection.  Overall feeling better after IV fluids.  He states when he is had these in the past his primary care doctor has given him a Z-Pak and that has helped.  Shared decision was made to give patient Z-Pak.  He understands return precautions and was discharged from the ED in good condition.  This chart was dictated using voice recognition software.  Despite best efforts to proofread,  errors can occur which can change the documentation meaning.   Final Clinical Impression(s) / ED Diagnoses Final diagnoses:  Influenza-like illness    Rx / DC Orders ED Discharge Orders         Ordered    azithromycin (ZITHROMAX) 250 MG tablet  Daily        07/16/20 1302           Lennice Sites, DO 07/16/20 1303

## 2020-07-16 NOTE — ED Triage Notes (Addendum)
Reports fever and body aches since yesterday.  Took 2 Tylenol at 6:30am.  Pt had negative COVID test prior to bronchoscopy last week.

## 2020-07-16 NOTE — ED Notes (Signed)
Lactic Acid 2.0.  Dr. Gilford Raid and Charge RN notified.

## 2020-07-17 ENCOUNTER — Telehealth: Payer: Self-pay

## 2020-07-17 LAB — URINE CULTURE: Culture: NO GROWTH

## 2020-07-17 LAB — AEROBIC/ANAEROBIC CULTURE W GRAM STAIN (SURGICAL/DEEP WOUND)

## 2020-07-17 NOTE — Telephone Encounter (Signed)
Patient evaluated/treated in ER.    [facility] Night - Client TELEPHONE ADVICE RECORD [facility] Patient Name: [name] Gender: Male DOB: [birth year] Age: 74 Y 6 M 40 D Return Phone Number: [phone] (Primary), [phone] (Secondary) Address: City/State/Zip: [place] [address] Client [facility] Night - Client Client Site [facility] - Night Physician Penni Homans - MD Contact Type Call Who Is Calling Patient / Member / Family / Caregiver Call Type Triage / Clinical Relationship To Patient Self Return Phone Number [phone] (Primary) Chief Complaint Fever (non urgent symptom) (> THREE MONTHS) Reason for Call Symptomatic / Request for [place] states he has a 100.9 temp and has joint pain and body pain. He has a cough. Translation No Nurse Assessment Nurse: Micki Riley, RN, Domenick Gong Date/Time (Eastern Time): 07/16/2020 6:56:26 AM Confirm and document reason for call. If symptomatic, describe symptoms. ---Caller states he has been having a fever, weakness, body/ joint aches, & a cough; first noted 1-2days ago. Denies any other symptoms. States had a negative COVID test last Monday & had a bronchoscopy (d/t spitting up small amounts of blood for the past few months) last Wednesday with no specific findings. Does the patient have any new or worsening symptoms? ---Yes Will a triage be completed? ---Yes Related visit to physician within the last 2 weeks? ---N/A Does the PT have any chronic conditions? (i.e. diabetes, asthma, this includes High risk factors for pregnancy, etc.) ---Yes List chronic conditions. ---COPD, lung removal 25 years ago Is this a behavioral health or substance abuse call? ---No Guidelines Guideline Title Affirmed Question Affirmed Notes Nurse Date/Time (Eastern Time) COVID-19 - Diagnosed or Suspected [1] Fever > 100.0 F (37.8 C) AND [2]  bedridden (e.g., nursing home patient, CVA, chronic illness, recovering from surgery) Cazares, RN, Sanjuanita Janie 07/16/2020 6:58:09 AM Disp. Time Eilene Ghazi Time) Disposition Final User PLEASE NOTE: All timestamps contained within this report are represented as [place] Standard Time. CONFIDENTIALTY NOTICE: This fax transmission is intended only for the addressee. It contains information that is legally privileged, confidential or otherwise protected from use or disclosure. If you are not the intended recipient, you are strictly prohibited from reviewing, disclosing, copying using or disseminating any of this information or taking any action in reliance on or regarding this information. If you have received this fax in error, please notify us immediately by telephone so that we can arrange for its return to [place]. Phone: [phone], Toll-Free: [phone], Fax: [phone] Page: 2 of 2 Call Id: [id number] 07/16/2020 7:09:26 AM See HCP within 4 Hours (or PCP triage) Yes Micki Riley, RN, Domenick Gong Caller Disagree/Comply Comply Caller Understands Yes PreDisposition InappropriateToAsk Care Advice Given Per Guideline SEE HCP (OR PCP TRIAGE) WITHIN 4 HOURS: CALL BACK IF: * You become worse CARE ADVICE given per COVID-19 - DIAGNOSED OR SUSPECTED (Adult) guideline. HOW TO PROTECT OTHERS - WHEN YOU ARE SICK WITH COVID-19: GENERAL CARE ADVICE FOR COVID-19 SYMPTOMS: Referrals GO TO FACILITY UNDECIDED

## 2020-07-17 NOTE — Telephone Encounter (Signed)
So seems like he should be seen. He can go to ER if he is escalating quickly or we could see him at 3:40 and move the VV at 3:40 to 4 or if he wants a VV (which is not as good but may be all he can muster) put him in at 4 as a VV

## 2020-07-18 NOTE — Telephone Encounter (Signed)
[place] attempted to contact 07/17/20 unable to reach pt   07/18/20 contacted  today is at home doing better than yesterday. Will see Surgeon on 07/19/20 and f/u with our office after

## 2020-07-19 ENCOUNTER — Other Ambulatory Visit: Payer: Self-pay | Admitting: *Deleted

## 2020-07-19 ENCOUNTER — Ambulatory Visit: Payer: Medicare HMO | Admitting: Thoracic Surgery (Cardiothoracic Vascular Surgery)

## 2020-07-19 ENCOUNTER — Other Ambulatory Visit: Payer: Self-pay

## 2020-07-19 ENCOUNTER — Encounter: Payer: Self-pay | Admitting: Thoracic Surgery (Cardiothoracic Vascular Surgery)

## 2020-07-19 VITALS — BP 171/82 | HR 70 | Resp 18 | Ht 67.0 in | Wt 147.0 lb

## 2020-07-19 DIAGNOSIS — J984 Other disorders of lung: Secondary | ICD-10-CM

## 2020-07-19 DIAGNOSIS — Z01818 Encounter for other preprocedural examination: Secondary | ICD-10-CM

## 2020-07-19 DIAGNOSIS — J860 Pyothorax with fistula: Secondary | ICD-10-CM

## 2020-07-19 MED ORDER — AMOXICILLIN-POT CLAVULANATE 875-125 MG PO TABS
1.0000 | ORAL_TABLET | Freq: Two times a day (BID) | ORAL | Status: DC
Start: 2020-07-19 — End: 2020-07-20

## 2020-07-19 NOTE — Progress Notes (Signed)
[address]       [place] [address]             [phone]     HPI: [name] returns for scheduled follow-up  [name] is a 74 year old man with a prior history of a right upper and middle lobectomy to me in 1996 for cancer.  He also has a history of tobacco abuse before quitting in 2002.  He developed hemoptysis back in July.  At one point he was treated with a Z-Pak.  Despite that, he has continued to have small amounts of hemoptysis up to the present day.  A CT of the chest in September showed a cystic area in the superior aspect of the right lower lobe.  A repeat CT about 6 weeks later showed slight interval enlargement of the area.  There also was a question whether was loculated in the pleural space or in the lung itself.  I did a navigational bronchoscopy on 07/12/2020.  Due to the airway distortion I was never able to get a good direct line on the cavitary lesion to try to do biopsies, but I did do a bronchoalveolar lavage.  No malignant cells were noted, but cultures grew out staph aureus and Acinetobacter.    He did well with the procedure and the following day.  The day after that he went fishing.  On Saturday he developed fevers and generalized muscle aches.  He thought he had the flu.  He went to the emergency room and apparently tested negative for Covid and the flu.  He was given a Z-Pak and discharged.  He has felt a little better since starting antibiotics. Past Medical History:  Diagnosis Date  . Allergic rhinitis   . Atypical chest pain 12/01/2014  . Back pain 01/12/2013  . COPD (chronic obstructive pulmonary disease) (HCC)    FeV1 64%-2007  . Dyspnea   . Emphysema   . GERD (gastroesophageal reflux disease)   . History of lung abscess    bronchiectasis with RMLandRLL ersection -1996- Dr Arlyce Dice  . Hordeolum externum (stye) 04/23/2015   Right eye  . Hypertension   . Impaired vision    glasses  . Osteoarthritis    hands and knees  .  Sinusitis, acute 04/23/2015    Current Outpatient Medications  Medication Sig Dispense Refill  . acetaminophen (TYLENOL) 650 MG CR tablet Take 1,300 mg by mouth every 8 (eight) hours as needed for pain.    Marland Kitchen albuterol (VENTOLIN HFA) 108 (90 Base) MCG/ACT inhaler INHALE 2 PUFFS INTO THE LUNGS EVERY 6 (SIX) HOURS AS NEEDED FOR WHEEZING OR SHORTNESS OF BREATH. 36 g 2  . amLODipine (NORVASC) 5 MG tablet TAKE 1 TABLET EVERY DAY (Patient taking differently: Take 5 mg by mouth daily. ) 90 tablet 1  . azithromycin (ZITHROMAX) 250 MG tablet Take 1 tablet (250 mg total) by mouth daily. Take first 2 tablets together, then 1 every day until finished. 6 tablet 0  . calcium elemental as carbonate (TUMS ULTRA 1000) 400 MG chewable tablet Chew 1,000 mg by mouth 2 (two) times daily.     . Capsaicin-Menthol (SALONPAS GEL EX) Apply 1 application topically daily as needed (arthritis pain).    Marland Kitchen diclofenac Sodium (VOLTAREN) 1 % GEL Apply 2 g topically 3 (three) times daily as needed (pain).    . fluticasone (FLONASE) 50 MCG/ACT nasal spray USE 2 SPRAYS IN EACH NOSTRIL EVERY DAY AS NEEDED FOR ALLERGIES OR RHINITIS (  Patient taking differently: Place 2 sprays into both nostrils daily. ) 48 g 3  . Glucosamine-Chondroitin 750-600 MG TABS Take 1 tablet by mouth 2 (two) times daily.    . Guaifenesin (MUCINEX MAXIMUM STRENGTH) 1200 MG TB12 Take 600 mg by mouth 2 (two) times daily.    . imiquimod (ALDARA) 5 % cream Apply topically 3 (three) times a week. 12 each 1  . loratadine (CLARITIN) 10 MG tablet Take 10 mg by mouth daily.    . meloxicam (MOBIC) 15 MG tablet Take 1 tablet (15 mg total) by mouth daily as needed for pain. (Patient taking differently: Take 15 mg by mouth daily. ) 90 tablet 1  . Menthol (ROBITUSSIN COUGH DROPS MT) Use as directed 1 lozenge in the mouth or throat 4 (four) times daily as needed (cough).     . Menthol, Topical Analgesic, (BLUE-EMU MAXIMUM STRENGTH EX) Apply 1 application topically daily as needed  (arthritis pain).     . montelukast (SINGULAIR) 10 MG tablet TAKE 1 TABLET (10 MG TOTAL) BY MOUTH DAILY. 90 tablet 3  . Multiple Vitamin (MULTIVITAMIN WITH MINERALS) TABS tablet Take 1 tablet by mouth daily.     . TRELEGY ELLIPTA 100-62.5-25 MCG/INH AEPB INHALE 1 PUFF INTO THE LUNGS DAILY (NEED MD APPOINTMENT) (Patient taking differently: Inhale 1 puff into the lungs daily. ) 180 each 1   Current Facility-Administered Medications  Medication Dose Route Frequency Provider Last Rate Last Admin  . amoxicillin-clavulanate (AUGMENTIN) 875-125 MG per tablet 1 tablet  1 tablet Oral Q12H [name], MD        Physical Exam BP (!) 171/82 (BP Location: Left Arm, Patient Position: Sitting)   Pulse 70   Resp 18   Ht _0  (1.702 m)   Wt 147 lb (66.7 kg)   SpO2 94% Comment: RA with mask on  BMI 23.79 kg/m  74 year old man in no acute distress  Diagnostic Tests: Susceptibility   Staphylococcus aureus Acinetobacter species    MIC MIC    AMPICILLIN/SULBACTAM   <=2 SENSITIVE  Sensitive    CEFTAZIDIME   4 SENSITIVE  Sensitive    CIPROFLOXACIN >=8 RESISTANT  Resistant <=0.25 SENS... Sensitive    CLINDAMYCIN RESISTANT  Resistant      ERYTHROMYCIN >=8 RESISTANT  Resistant      GENTAMICIN <=0.5 SENSI... Sensitive <=1 SENSITIVE  Sensitive    IMIPENEM   0.5 SENSITIVE  Sensitive    Inducible Clindamycin POSITIVE  Resistant      OXACILLIN <=0.25 SENS... Sensitive      PIP/TAZO   >=128 RESIS... Resistant    RIFAMPIN <=0.5 SENSI... Sensitive      TETRACYCLINE <=1 SENSITIVE  Sensitive      TRIMETH/SULFA <=10 SENSIT... Sensitive <=20 SENSIT... Sensitive    VANCOMYCIN 1 SENSITIVE  Sensitive        Impression: [name] is a 74 year old man with a prior right upper and middle bilobectomy who presented with hemoptysis.  He said hemoptysis going back to July.  He was treated with a Z-Pak at one point but his hemoptysis persisted.  He then was noted to have a cavitary lesion on his CT of the  chest.  Interval follow-up that cavitary lesion had increased in size slightly.  Bronchoalveolar lavage was negative for malignant cells.  I explained to him that this does not rule out the possibility of malignancy.  Unfortunately we were unable to get a good alignment with the lesion to do biopsies.  His cultures did grow out  staph aureus and Acinetobacter.  It may be that he has just never been completely treated for an underlying infection.  The staph was oxacillin sensitive and the Acinetobacter is sensitive to Augmentin.  I gave him a prescription for Augmentin 875/125 1 p.o. twice daily for 14 days.  Plan: Augmentin for 14 days Return in 1 month with repeat CT chest  [name], MD [facility] [phone]

## 2020-07-20 ENCOUNTER — Other Ambulatory Visit: Payer: Self-pay

## 2020-07-20 ENCOUNTER — Encounter: Payer: Self-pay | Admitting: Family Medicine

## 2020-07-20 MED ORDER — AMOXICILLIN-POT CLAVULANATE 875-125 MG PO TABS: 1.0000 | ORAL_TABLET | Freq: Two times a day (BID) | ORAL | 0 refills | Status: AC

## 2020-08-02 DIAGNOSIS — R042 Hemoptysis: Secondary | ICD-10-CM | POA: Diagnosis not present

## 2020-08-02 DIAGNOSIS — R0982 Postnasal drip: Secondary | ICD-10-CM | POA: Diagnosis not present

## 2020-08-10 LAB — FUNGUS CULTURE RESULT

## 2020-08-10 LAB — FUNGUS CULTURE WITH STAIN

## 2020-08-10 LAB — FUNGAL ORGANISM REFLEX

## 2020-08-25 LAB — ACID FAST CULTURE WITH REFLEXED SENSITIVITIES (MYCOBACTERIA): Acid Fast Culture: NEGATIVE

## 2020-08-29 ENCOUNTER — Ambulatory Visit: Payer: Medicare HMO | Admitting: Thoracic Surgery (Cardiothoracic Vascular Surgery)

## 2020-08-29 ENCOUNTER — Other Ambulatory Visit: Payer: Self-pay

## 2020-08-29 ENCOUNTER — Ambulatory Visit
Admission: RE | Admit: 2020-08-29 | Discharge: 2020-08-29 | Disposition: A | Discharge status: Home / Self Care | Payer: Medicare HMO | Source: Ambulatory Visit | Attending: Thoracic Surgery (Cardiothoracic Vascular Surgery) | Admitting: Thoracic Surgery (Cardiothoracic Vascular Surgery)

## 2020-08-29 VITALS — BP 160/57 | HR 54 | Resp 20 | Ht 67.0 in | Wt 148.0 lb

## 2020-08-29 DIAGNOSIS — J984 Other disorders of lung: Secondary | ICD-10-CM | POA: Diagnosis not present

## 2020-08-29 DIAGNOSIS — I700 Atherosclerosis of aorta: Secondary | ICD-10-CM | POA: Diagnosis not present

## 2020-08-29 DIAGNOSIS — J439 Emphysema, unspecified: Secondary | ICD-10-CM | POA: Diagnosis not present

## 2020-08-29 DIAGNOSIS — J860 Pyothorax with fistula: Secondary | ICD-10-CM | POA: Diagnosis not present

## 2020-08-29 DIAGNOSIS — R918 Other nonspecific abnormal finding of lung field: Secondary | ICD-10-CM | POA: Diagnosis not present

## 2020-08-29 DIAGNOSIS — Z01818 Encounter for other preprocedural examination: Secondary | ICD-10-CM

## 2020-08-29 MED ORDER — AMOXICILLIN-POT CLAVULANATE 875-125 MG PO TABS: 1.0000 | ORAL_TABLET | Freq: Two times a day (BID) | ORAL | 0 refills | Status: AC

## 2020-08-29 NOTE — Progress Notes (Signed)
[address]       [place] [address]             [phone]    HPI: Mr. [name] returns for scheduled follow-up visit  Banner "Al" Orvis is a 74 year old man with a past medical history significant for right upper and middle lobectomy for bronchiectasis and abscess in 1996.  He also has a history of tobacco abuse (quit in 2002), COPD, hypertension, reflux, and arthritis. He was treated with a Z-Pak.  He continued to have small amounts of hemoptysis.  His CT of the chest in September showed a cystic area in the superior segment of the right upper lobe versus a pleural space.  A repeat showed a slight enlargement of the area.  I did bronchoscopy on 07/12/2020.  The airways were very distorted but there was no bleeding site identified in the proximal airways.  I was unable to feed the catheter out to the peripheral lesion.  Bronchoalveolar lavage showed no malignant cells but cultures grew out staph aureus and Acinetobacter.   I last saw him on 07/19/2020.  I treated him with a 14-day course of Augmentin.  He now returns for follow-up.  He continues to have small amounts of hemoptysis daily.  Always less than a teaspoon.  He estimates 2 to 3 mL.  No fevers or chills.  He was evaluated by ENT who found no upper airway source.  Past Medical History:  Diagnosis Date  . Allergic rhinitis   . Atypical chest pain 12/01/2014  . Back pain 01/12/2013  . COPD (chronic obstructive pulmonary disease) (HCC)    FeV1 64%-2007  . Dyspnea   . Emphysema   . GERD (gastroesophageal reflux disease)   . History of lung abscess    bronchiectasis with RMLandRLL ersection -1996- Dr Arlyce Dice  . Hordeolum externum (stye) 04/23/2015   Right eye  . Hypertension   . Impaired vision    glasses  . Osteoarthritis    hands and knees  . Sinusitis, acute 04/23/2015    Current Outpatient Medications  Medication Sig Dispense Refill  . acetaminophen (TYLENOL) 650 MG CR tablet Take 1,300 mg by mouth  every 8 (eight) hours as needed for pain.    Marland Kitchen albuterol (VENTOLIN HFA) 108 (90 Base) MCG/ACT inhaler INHALE 2 PUFFS INTO THE LUNGS EVERY 6 (SIX) HOURS AS NEEDED FOR WHEEZING OR SHORTNESS OF BREATH. 36 g 2  . amLODipine (NORVASC) 5 MG tablet TAKE 1 TABLET EVERY DAY (Patient taking differently: Take 5 mg by mouth daily.) 90 tablet 1  . azithromycin (ZITHROMAX) 250 MG tablet Take 1 tablet (250 mg total) by mouth daily. Take first 2 tablets together, then 1 every day until finished. 6 tablet 0  . calcium elemental as carbonate (BARIATRIC TUMS ULTRA) 400 MG chewable tablet Chew 1,000 mg by mouth 2 (two) times daily.     . Capsaicin-Menthol (SALONPAS GEL EX) Apply 1 application topically daily as needed (arthritis pain).    Marland Kitchen diclofenac Sodium (VOLTAREN) 1 % GEL Apply 2 g topically 3 (three) times daily as needed (pain).    . fluticasone (FLONASE) 50 MCG/ACT nasal spray USE 2 SPRAYS IN EACH NOSTRIL EVERY DAY AS NEEDED FOR ALLERGIES OR RHINITIS (Patient taking differently: Place 2 sprays into both nostrils daily.) 48 g 3  . Glucosamine-Chondroitin 750-600 MG TABS Take 1 tablet by mouth 2 (two) times daily.    . Guaifenesin 1200 MG TB12 Take 600 mg by mouth 2 (two)  times daily.    . imiquimod (ALDARA) 5 % cream Apply topically 3 (three) times a week. 12 each 1  . loratadine (CLARITIN) 10 MG tablet Take 10 mg by mouth daily.    . meloxicam (MOBIC) 15 MG tablet Take 1 tablet (15 mg total) by mouth daily as needed for pain. (Patient taking differently: Take 15 mg by mouth daily.) 90 tablet 1  . Menthol (ROBITUSSIN COUGH DROPS MT) Use as directed 1 lozenge in the mouth or throat 4 (four) times daily as needed (cough).     . Menthol, Topical Analgesic, (BLUE-EMU MAXIMUM STRENGTH EX) Apply 1 application topically daily as needed (arthritis pain).     . montelukast (SINGULAIR) 10 MG tablet TAKE 1 TABLET (10 MG TOTAL) BY MOUTH DAILY. 90 tablet 3  . Multiple Vitamin (MULTIVITAMIN WITH MINERALS) TABS tablet Take 1  tablet by mouth daily.     . TRELEGY ELLIPTA 100-62.5-25 MCG/INH AEPB INHALE 1 PUFF INTO THE LUNGS DAILY (NEED MD APPOINTMENT) (Patient taking differently: Inhale 1 puff into the lungs daily.) 180 each 1   No current facility-administered medications for this visit.    Physical Exam BP (!) 160/57 (BP Location: Left Arm, Patient Position: Sitting)   Pulse (!) 54   Resp 20   Ht _0  (1.702 m)   Wt 148 lb (67.1 kg)   SpO2 93% Comment: RA with mask on  BMI 23.42 kg/m  74 year old man in no acute distress Alert and oriented x3 with no focal deficits Lungs diminished at right base but otherwise clear Cardiac regular rate and rhythm  Diagnostic Tests: CT CHEST WITHOUT CONTRAST  TECHNIQUE: Multidetector CT imaging of the chest was performed following the standard protocol without IV contrast.  COMPARISON:  June 26, 2020.  FINDINGS: Cardiovascular: Atherosclerosis of thoracic aorta is noted without aneurysm or dissection. Normal cardiac size. No pericardial effusion.  Mediastinum/Nodes: No enlarged mediastinal or axillary lymph nodes. Thyroid gland, trachea, and esophagus demonstrate no significant findings.  Lungs/Pleura: Status post right upper and middle lobe lobectomy. Left lung is clear. No pneumothorax is noted. Stable appearance of air-filled abnormality seen in right lung apex most consistent with loculated pleural air collection. Right apical scarring and pleural thickening is noted most consistent with postoperative status. Stable scarring and bulla formation is noted in the right lower lobe. New irregular density is noted peripherally in the right upper lobe laterally which measures 12 x 8 mm and most likely represents scarring or focal inflammation, but malignancy cannot be excluded.  Upper Abdomen: No acute abnormality.  Musculoskeletal: No chest wall mass or suspicious bone lesions identified.  IMPRESSION: 1. Status post right upper and middle  lobe lobectomy. Stable appearance of air-filled abnormality seen in right lung apex most consistent with loculated pleural air collection. Right apical scarring and pleural thickening is noted most consistent with postoperative status. 2. New irregular density is noted peripherally in the right upper lobe laterally which measures 12 x 8 mm and most likely represents scarring or focal inflammation, but malignancy cannot be excluded. Follow-up CT scan in 3 months is recommended to ensure stability or resolution. 3. Stable scarring and bulla formation is noted in the right lower lobe. 4. Aortic atherosclerosis.  Aortic Atherosclerosis (ICD10-I70.0).   Electronically Signed   By: Marijo Conception M.D.   On: 08/29/2020 13:01 I personally reviewed the CT images and concur with the findings noted above.  There also are multiple areas consistent with infection or inflammation in the lower lobe that  were pointed out by Dr. Jacqulynn Cadet.  Impression: [name] is a 74 year old man with a past medical history significant for right upper and middle lobectomy for bronchiectasis and abscess in 1996.  He also has a history of tobacco abuse (quit in 2002), COPD, hypertension, reflux, and arthritis.   He started having small volumes of hemoptysis back in July.  He has been treated with a Z-Pak without any effect.  He underwent bronchoscopy.  There were no central lesions to explain his hemoptysis.  He is also had an ENT evaluation which found no upper airway issues.  Cultures grew out Acinetobacter and staph aureus.  He was treated with 2 weeks of Augmentin for that.  However he continues to have hemoptysis.  I discussed the case with Dr. Jacqulynn Cadet of interventional radiology to get his opinion regarding possible bronchial artery embolization. He pointed out that the CT today shows multiple areas of density in the right upper lobe and the right lower lobe.  He feels that this could  represent infection and favored continued antibiotics.   Plan: We will plan for a 1 month course of Augmentin Repeat CT in about 8 weeks.   If hemoptysis persists will refer to IR for possible bronchial artery embolization.  I spent over 20 minutes in review of images, records, and consultation with Mr. Wilczak and Dr. Laurence Ferrari today.  [name], MD [facility] [phone]

## 2020-08-31 ENCOUNTER — Encounter: Payer: Self-pay | Admitting: Family Medicine

## 2020-09-18 ENCOUNTER — Other Ambulatory Visit: Payer: Medicare HMO

## 2020-09-20 ENCOUNTER — Other Ambulatory Visit: Payer: Self-pay | Admitting: Thoracic Surgery (Cardiothoracic Vascular Surgery)

## 2020-09-20 ENCOUNTER — Other Ambulatory Visit (INDEPENDENT_AMBULATORY_CARE_PROVIDER_SITE_OTHER): Payer: Medicare HMO

## 2020-09-20 ENCOUNTER — Other Ambulatory Visit: Payer: Self-pay

## 2020-09-20 DIAGNOSIS — R918 Other nonspecific abnormal finding of lung field: Secondary | ICD-10-CM

## 2020-09-20 DIAGNOSIS — E785 Hyperlipidemia, unspecified: Secondary | ICD-10-CM | POA: Diagnosis not present

## 2020-09-20 DIAGNOSIS — I10 Essential (primary) hypertension: Secondary | ICD-10-CM | POA: Diagnosis not present

## 2020-09-20 LAB — COMPREHENSIVE METABOLIC PANEL
ALT: 13 U/L (ref 0–53)
AST: 21 U/L (ref 0–37)
Albumin: 4.3 g/dL (ref 3.5–5.2)
Alkaline Phosphatase: 79 U/L (ref 39–117)
BUN: 23 mg/dL (ref 6–23)
CO2: 28 mEq/L (ref 19–32)
Calcium: 10 mg/dL (ref 8.4–10.5)
Chloride: 103 mEq/L (ref 96–112)
Creatinine, Ser: 0.88 mg/dL (ref 0.40–1.50)
GFR: 85.09 mL/min (ref >60.00)
Glucose, Bld: 71 mg/dL (ref 70–99)
Potassium: 4.5 mEq/L (ref 3.5–5.1)
Sodium: 137 mEq/L (ref 135–145)
Total Bilirubin: 0.7 mg/dL (ref 0.2–1.2)
Total Protein: 7.1 g/dL (ref 6.0–8.3)

## 2020-09-20 LAB — CBC
HCT: 42.3 % (ref 39.0–52.0)
Hemoglobin: 13.9 g/dL (ref 13.0–17.0)
MCHC: 33 g/dL (ref 30.0–36.0)
MCV: 94.2 fl (ref 78.0–100.0)
Platelets: 244 10*3/uL (ref 150.0–400.0)
RBC: 4.49 Mil/uL (ref 4.22–5.81)
RDW: 13.4 % (ref 11.5–15.5)
WBC: 5.1 10*3/uL (ref 4.0–10.5)

## 2020-09-20 LAB — LIPID PANEL
Cholesterol: 153 mg/dL (ref 0–200)
HDL: 47.1 mg/dL (ref >39.00)
LDL Cholesterol: 70 mg/dL (ref 0–99)
NonHDL: 106.35
Total CHOL/HDL Ratio: 3
Triglycerides: 184 mg/dL — ABNORMAL HIGH (ref 0.0–149.0)
VLDL: 36.8 mg/dL (ref 0.0–40.0)

## 2020-09-20 MED FILL — SHINGRIX 50 MCG SUS: 50 | 1 days supply | Qty: 1 | Fill #1

## 2020-09-20 NOTE — Addendum Note (Signed)
Addended by: Kelle Darting A on: 09/20/2020 10:19 AM   Modules accepted: Orders

## 2020-09-28 ENCOUNTER — Ambulatory Visit: Payer: Medicare HMO | Admitting: Pulmonary Disease

## 2020-10-03 ENCOUNTER — Ambulatory Visit: Payer: Medicare HMO | Admitting: Pulmonary Disease

## 2020-10-03 DIAGNOSIS — Z0100 Encounter for examination of eyes and vision without abnormal findings: Secondary | ICD-10-CM | POA: Diagnosis not present

## 2020-10-03 DIAGNOSIS — H524 Presbyopia: Secondary | ICD-10-CM | POA: Diagnosis not present

## 2020-10-06 ENCOUNTER — Other Ambulatory Visit: Payer: Self-pay | Admitting: Family Medicine

## 2020-10-16 ENCOUNTER — Other Ambulatory Visit: Payer: Self-pay | Admitting: Family Medicine

## 2020-10-16 ENCOUNTER — Encounter: Payer: Self-pay | Admitting: Family Medicine

## 2020-10-16 MED ORDER — TRELEGY ELLIPTA 100-62.5-25 MCG/INH IN AEPB: INHALATION_SPRAY | RESPIRATORY_TRACT | 1 refills | Status: DC

## 2020-10-16 MED ORDER — TRELEGY ELLIPTA 100-62.5-25 MCG/INH IN AEPB: INHALATION_SPRAY | RESPIRATORY_TRACT | 0 refills | Status: DC

## 2020-10-18 ENCOUNTER — Ambulatory Visit
Admission: RE | Admit: 2020-10-18 | Discharge: 2020-10-18 | Disposition: A | Discharge status: Home / Self Care | Payer: Medicare HMO | Source: Ambulatory Visit | Attending: Thoracic Surgery (Cardiothoracic Vascular Surgery) | Admitting: Thoracic Surgery (Cardiothoracic Vascular Surgery)

## 2020-10-18 DIAGNOSIS — R918 Other nonspecific abnormal finding of lung field: Secondary | ICD-10-CM | POA: Diagnosis not present

## 2020-10-24 ENCOUNTER — Ambulatory Visit: Payer: Medicare HMO | Admitting: Thoracic Surgery (Cardiothoracic Vascular Surgery)

## 2020-10-24 ENCOUNTER — Encounter: Payer: Self-pay | Admitting: Thoracic Surgery (Cardiothoracic Vascular Surgery)

## 2020-10-24 ENCOUNTER — Other Ambulatory Visit: Payer: Self-pay

## 2020-10-24 VITALS — BP 149/73 | HR 81 | Temp 98.1°F | Resp 20 | Ht 67.0 in | Wt 150.0 lb

## 2020-10-24 DIAGNOSIS — R042 Hemoptysis: Secondary | ICD-10-CM

## 2020-10-24 MED ORDER — CIPROFLOXACIN HCL 500 MG PO TABS: 500.0000 mg | ORAL_TABLET | Freq: Two times a day (BID) | ORAL | 0 refills | Status: AC

## 2020-10-24 NOTE — Progress Notes (Signed)
[address]       [place] [address]             [phone]     HPI: [name] returns for follow-up of his hemoptysis  Trent Gabler is a 75 year old man with a past history of a right upper and middle lobectomy for bronchiectasis and abscess in 1996, tobacco abuse, COPD, hypertension, reflux, arthritis, and hemoptysis.  He developed hemoptysis in July 2021.  He was treated with a Z-Pak but continued to have small amounts of hemoptysis.  CT of the chest in September showed a cystic area in the superior aspect of the right lower lobe.  After 6 weeks a repeat CT showed a slight enlargement in that area.  I did a navigational bronchoscopy in November 2021.  There was severe airway distortion.  I do not see any central source for the bleeding.  I was not able to get a good direct line to do biopsies.  BAL cultures did grow out staph aureus and Acinetobacter.  A few days after that he developed fevers and generalized myalgias.  He was treated with a Z-Pak.  He continued to have hemoptysis.  ENT ruled out any upper airway source.  I treated him with a 2-week course of Augmentin.  He now returns for follow-up.  He continues to have hemoptysis.  He will see spots about the diameter of the time.  He does not have it every day but at least every 2 to 3 days he will see his some.  He is not having fevers, chills or sweats.  Past Medical History:  Diagnosis Date  . Allergic rhinitis   . Atypical chest pain 12/01/2014  . Back pain 01/12/2013  . COPD (chronic obstructive pulmonary disease) (HCC)    FeV1 64%-2007  . Dyspnea   . Emphysema   . GERD (gastroesophageal reflux disease)   . History of lung abscess    bronchiectasis with RMLandRLL ersection -1996- Dr Arlyce Dice  . Hordeolum externum (stye) 04/23/2015   Right eye  . Hypertension   . Impaired vision    glasses  . Osteoarthritis    hands and knees  . Sinusitis, acute 04/23/2015    Current Outpatient Medications   Medication Sig Dispense Refill  . acetaminophen (TYLENOL) 650 MG CR tablet Take 1,300 mg by mouth every 8 (eight) hours as needed for pain.    Marland Kitchen albuterol (VENTOLIN HFA) 108 (90 Base) MCG/ACT inhaler INHALE 2 PUFFS INTO THE LUNGS EVERY 6 (SIX) HOURS AS NEEDED FOR WHEEZING OR SHORTNESS OF BREATH. 36 g 2  . amLODipine (NORVASC) 5 MG tablet TAKE 1 TABLET EVERY DAY (Patient taking differently: Take 5 mg by mouth daily.) 90 tablet 1  . calcium elemental as carbonate (BARIATRIC TUMS ULTRA) 400 MG chewable tablet Chew 1,000 mg by mouth 2 (two) times daily.     . Capsaicin-Menthol (SALONPAS GEL EX) Apply 1 application topically daily as needed (arthritis pain).    . ciprofloxacin (CIPRO) 500 MG tablet Take 1 tablet (500 mg total) by mouth 2 (two) times daily for 14 days. 28 tablet 0  . diclofenac Sodium (VOLTAREN) 1 % GEL Apply 2 g topically 3 (three) times daily as needed (pain).    . fluticasone (FLONASE) 50 MCG/ACT nasal spray USE 2 SPRAYS IN EACH NOSTRIL EVERY DAY AS NEEDED FOR ALLERGIES OR RHINITIS (Patient taking differently: Place 2 sprays into both nostrils daily.) 48 g 3  . Fluticasone-Umeclidin-Vilant (TRELEGY ELLIPTA) 100-62.5-25 MCG/INH  AEPB INHALE 1 PUFF INTO THE LUNGS DAILY 180 each 1  . Glucosamine-Chondroitin 750-600 MG TABS Take 1 tablet by mouth 2 (two) times daily.    . Guaifenesin 1200 MG TB12 Take 600 mg by mouth 2 (two) times daily.    . imiquimod (ALDARA) 5 % cream Apply topically 3 (three) times a week. 12 each 1  . loratadine (CLARITIN) 10 MG tablet Take 10 mg by mouth daily.    . meloxicam (MOBIC) 15 MG tablet Take 1 tablet (15 mg total) by mouth daily as needed for pain. (Patient taking differently: Take 15 mg by mouth daily.) 90 tablet 1  . Menthol (ROBITUSSIN COUGH DROPS MT) Use as directed 1 lozenge in the mouth or throat 4 (four) times daily as needed (cough).     . Menthol, Topical Analgesic, (BLUE-EMU MAXIMUM STRENGTH EX) Apply 1 application topically daily as needed  (arthritis pain).     . montelukast (SINGULAIR) 10 MG tablet TAKE 1 TABLET EVERY DAY 90 tablet 3  . Multiple Vitamin (MULTIVITAMIN WITH MINERALS) TABS tablet Take 1 tablet by mouth daily.      No current facility-administered medications for this visit.    Physical Exam BP (!) 149/73 (BP Location: Left Arm, Patient Position: Sitting, Cuff Size: Normal)   Pulse 81   Temp 98.1 F (36.7 C) (Skin)   Resp 20   Ht _0  (1.702 m)   Wt 150 lb (68 kg)   SpO2 97% Comment: RA  BMI 23.61 kg/m  75 year old man in no acute distress Alert and oriented x3 with no focal deficits Well-developed and well-nourished Lungs clear  Diagnostic Tests: CT CHEST WITHOUT CONTRAST  TECHNIQUE: Multidetector CT imaging of the chest was performed following the standard protocol without IV contrast.  COMPARISON:  08/29/2020  FINDINGS: Cardiovascular: Heart size is normal. No pericardial effusion. Mild coronary artery calcifications. Aortic atherosclerosis.  Mediastinum/Nodes: Normal appearance of the thyroid gland. Trachea appears patent. Normal appearance of the esophagus. No enlarged axillary, supraclavicular, mediastinal, or hilar adenopathy.  Lungs/Pleura: Loculated collection of gas within the subpleural aspect of the lateral right upper lung appears unchanged from previous exam. Loculated collection of gas overlying the posterior medial right lung base is similar to the previous exam.  The subpleural nodule within the lateral right lung has partially resolved in the interval compatible with a benign postinflammatory or infectious process. Residual small nodular densities are identified including a 4 mm parenchymal nodule, image 57/8 and adjacent 4 mm subpleural nodule, also on image 57 of series 8.  Multiple tiny progressive lung nodules are scattered throughout both lungs and appear peripherally predominant, for example:  -new 4 mm left lower lobe lung nodule, image 88/8  -3 mm  peripheral nodule in the right base, image 100/8.  -3 mm lateral right lower lung nodule, image 86/8.  Upper Abdomen: No acute abnormality.  Musculoskeletal: No chest wall mass or suspicious bone lesions identified. Previous ACDF. Posterior hardware fusion of the visualized portions of the lumbar spine noted. Moderate lower thoracic spine degenerative disc disease.  IMPRESSION: 1. Stable appearance of loculated collection of gas within the subpleural aspect of the lateral right upper lung and posterior medial right lung base. 2. The irregular subpleural nodule within the lateral right lung has partially resolved in the interval likely reflecting with a benign postinflammatory or infectious process. Here, there are 2 residual less than 5 mm nodules. Consider additional 3-6 month follow-up exam to ensure complete resolution 3. Multiple tiny progressive lung nodules are  scattered throughout both lungs and appear peripherally predominant. These are nonspecific and but are favored to represent a postinflammatory or infectious in etiology. Attention on follow-up imaging is advised. 4. Aortic Atherosclerosis (ICD10-I70.0). Coronary artery calcifications noted.  Aortic Atherosclerosis (ICD10-I70.0).   Electronically Signed   By: Kerby Moors M.D.   On: 10/18/2020 12:33 I personally reviewed the CT images.  No change in the cavitary lesion superiorly.  The irregular nodule in the lateral right lung is significantly improved.  Multiple additional small nodules.  Impression: [name] is a 75 year old man with a past history of a right upper and middle lobectomy for bronchiectasis and abscess in 1996, tobacco abuse, COPD, hypertension, reflux, arthritis, and hemoptysis.   He has had hemoptysis going on about 7 months now.  He has had some waxing and waning but is never completely resolved.  His CT today shows improvement of a subpleural nodule in the right lung.  There is  marked airway distortion post previous right upper and middle lobectomy.  There are multiple tiny nodules suggestive of infection.  If this is infection, it is more likely due to the Acinetobacter than staph aureus.  It was most sensitive to Cipro.  We used Augmentin previously because there was hope for it to also cover staph.  I discussed the options of repeat bronchoscopy, another trial of a different antibiotic, and observation with Mr. Dauber.  He is not at all interested in a repeat bronchoscopy or observation.  The Acinetobacter was most sensitive to ciprofloxacin.  I will give him a prescription for that for 2 weeks.  I did warn him about that possibility of tendon rupture.  Plan: Ciprofloxacin 500 mg p.o. twice daily for 2 weeks Return in 6 weeks with PA lateral chest x-ray We will need another CT eventually.  I spent 29 minutes and review of records, images, and consultation with Mr. Nyborg today. [name], MD [facility] [phone]

## 2020-11-07 ENCOUNTER — Telehealth: Payer: Self-pay | Admitting: Acute Care

## 2020-11-07 NOTE — Telephone Encounter (Signed)
Called and spoke with pt and he is aware that the CT that he had done on 02/16 was from Dr. Roxan Hockey.  The CT that was cancelled was the same CT scan and he is aware that he did not need the same CT scan done at this time.  Pt voiced his understanding.

## 2020-11-20 ENCOUNTER — Other Ambulatory Visit: Payer: Self-pay | Admitting: Family Medicine

## 2020-11-27 ENCOUNTER — Other Ambulatory Visit (HOSPITAL_BASED_OUTPATIENT_CLINIC_OR_DEPARTMENT_OTHER): Payer: Medicare HMO

## 2020-12-04 ENCOUNTER — Other Ambulatory Visit: Payer: Self-pay | Admitting: Thoracic Surgery (Cardiothoracic Vascular Surgery)

## 2020-12-04 DIAGNOSIS — R911 Solitary pulmonary nodule: Secondary | ICD-10-CM

## 2020-12-05 ENCOUNTER — Ambulatory Visit: Payer: Medicare HMO | Admitting: Thoracic Surgery (Cardiothoracic Vascular Surgery)

## 2020-12-05 ENCOUNTER — Ambulatory Visit
Admission: RE | Admit: 2020-12-05 | Discharge: 2020-12-05 | Disposition: A | Discharge status: Home / Self Care | Payer: Medicare HMO | Source: Ambulatory Visit | Attending: Thoracic Surgery (Cardiothoracic Vascular Surgery) | Admitting: Thoracic Surgery (Cardiothoracic Vascular Surgery)

## 2020-12-05 ENCOUNTER — Other Ambulatory Visit: Payer: Self-pay

## 2020-12-05 VITALS — BP 180/80 | HR 77 | Resp 20 | Ht 67.0 in | Wt 146.0 lb

## 2020-12-05 DIAGNOSIS — R911 Solitary pulmonary nodule: Secondary | ICD-10-CM

## 2020-12-05 DIAGNOSIS — J984 Other disorders of lung: Secondary | ICD-10-CM | POA: Diagnosis not present

## 2020-12-05 DIAGNOSIS — R042 Hemoptysis: Secondary | ICD-10-CM | POA: Diagnosis not present

## 2020-12-05 DIAGNOSIS — Z9889 Other specified postprocedural states: Secondary | ICD-10-CM | POA: Diagnosis not present

## 2020-12-05 DIAGNOSIS — R918 Other nonspecific abnormal finding of lung field: Secondary | ICD-10-CM | POA: Diagnosis not present

## 2020-12-05 MED ORDER — CIPROFLOXACIN HCL 250 MG PO TABS: 250.0000 mg | ORAL_TABLET | Freq: Two times a day (BID) | ORAL | 0 refills | Status: AC

## 2020-12-05 NOTE — Progress Notes (Signed)
[address]       [place] [address]             [phone]     HPI: [name] returns for follow-up of hemoptysis.  [name] is a 75 year old gentleman with a history of a right upper and middle bilobectomy for bronchiectasis and abscess in 1996, tobacco abuse, COPD, hypertension, reflux, arthritis, and hemoptysis.  He developed my office in July 2021.  Treatment with a Z-Pak had no effect.  A CT of the chest in September showed a cystic area in the superior aspect of the right lower lobe.  6-week follow-up showed slight enlargement of that area.  I did a navigational bronchoscopy in November 2021.  There was no central source of bleeding.  There was severe airway distortion.  Unfortunately was not able to get good biopsies of the cavitary lung area.  BAL cultures grew out staph and Acinetobacter.  Treatment with another Z-Pak had no effect.  He was seen by ENT who ruled out an upper airway source.  He then had a 2-week course of Augmentin.  That also had minimal effect.  I last saw him in February.  He was still having hemoptysis.  His Acinetobacter was most sensitive to ciprofloxacin.  We decided to do a 2-week course of that and now returns for follow-up.  With the ciprofloxacin he noticed a significant decrease in the hemoptysis but not a complete resolution.  He says he is now having hemoptysis about once a week on average.  But does occur he may have been 2 or 3 times in the same day.  He is seeing more specks rather than large streaks of blood.  He did not have any adverse effects associated with taking medication.  Past Medical History:  Diagnosis Date  . Allergic rhinitis   . Atypical chest pain 12/01/2014  . Back pain 01/12/2013  . COPD (chronic obstructive pulmonary disease) (HCC)    FeV1 64%-2007  . Dyspnea   . Emphysema   . GERD (gastroesophageal reflux disease)   . History of lung abscess    bronchiectasis with RMLandRLL ersection -1996- Dr  [name]  . Hordeolum externum (stye) 04/23/2015   Right eye  . Hypertension   . Impaired vision    glasses  . Osteoarthritis    hands and knees  . Sinusitis, acute 04/23/2015    Current Outpatient Medications  Medication Sig Dispense Refill  . acetaminophen (TYLENOL) 650 MG CR tablet Take 1,300 mg by mouth every 8 (eight) hours as needed for pain.    Marland Kitchen albuterol (VENTOLIN HFA) 108 (90 Base) MCG/ACT inhaler INHALE 2 PUFFS INTO THE LUNGS EVERY 6 (SIX) HOURS AS NEEDED FOR WHEEZING OR SHORTNESS OF BREATH. 36 g 2  . amLODipine (NORVASC) 5 MG tablet TAKE 1 TABLET EVERY DAY 90 tablet 1  . calcium elemental as carbonate (BARIATRIC TUMS ULTRA) 400 MG chewable tablet Chew 1,000 mg by mouth 2 (two) times daily.     . Capsaicin-Menthol (SALONPAS GEL EX) Apply 1 application topically daily as needed (arthritis pain).    . ciprofloxacin (CIPRO) 250 MG tablet Take 1 tablet (250 mg total) by mouth 2 (two) times daily for 28 days. 56 tablet 0  . diclofenac Sodium (VOLTAREN) 1 % GEL Apply 2 g topically 3 (three) times daily as needed (pain).    . fluticasone (FLONASE) 50 MCG/ACT nasal spray USE 2 SPRAYS IN EACH NOSTRIL EVERY DAY AS NEEDED FOR ALLERGIES  OR RHINITIS (Patient taking differently: Place 2 sprays into both nostrils daily.) 48 g 3  . Fluticasone-Umeclidin-Vilant (TRELEGY ELLIPTA) 100-62.5-25 MCG/INH AEPB INHALE 1 PUFF INTO THE LUNGS DAILY 180 each 1  . Glucosamine-Chondroitin 750-600 MG TABS Take 1 tablet by mouth 2 (two) times daily.    . Guaifenesin 1200 MG TB12 Take 600 mg by mouth 2 (two) times daily.    . imiquimod (ALDARA) 5 % cream Apply topically 3 (three) times a week. 12 each 1  . loratadine (CLARITIN) 10 MG tablet Take 10 mg by mouth daily.    . meloxicam (MOBIC) 15 MG tablet TAKE 1 TABLET EVERY DAY AS NEEDED FOR PAIN 90 tablet 1  . Menthol (ROBITUSSIN COUGH DROPS MT) Use as directed 1 lozenge in the mouth or throat 4 (four) times daily as needed (cough).     . Menthol, Topical Analgesic,  (BLUE-EMU MAXIMUM STRENGTH EX) Apply 1 application topically daily as needed (arthritis pain).     . montelukast (SINGULAIR) 10 MG tablet TAKE 1 TABLET EVERY DAY 90 tablet 3  . Multiple Vitamin (MULTIVITAMIN WITH MINERALS) TABS tablet Take 1 tablet by mouth daily.     Marland Kitchen Zoster Vaccine Adjuvanted [facility]) injection INJECT AS DIRECTED 1 each 1   No current facility-administered medications for this visit.    Physical Exam BP (!) 180/80   Pulse 77   Resp 20   Ht _0  (1.702 m)   Wt 146 lb (66.2 kg)   SpO2 95% Comment: RA  BMI 22.47 kg/m  75 year old man in no acute distress Alert and oriented x3 with no focal deficits Lungs diminished at right base with mild bronchial breath sounds on the right, no wheezing Cardiac regular rate and rhythm with  Diagnostic Tests: I personally reviewed the chest x-ray.  It is unchanged from his previous film.  Impression: [name] is a 75 year old man with a history of a right upper and middle bilobectomy for bronchiectasis and abscess back in 1996.  He also has a history of tobacco abuse, COPD, hypertension, reflux, arthritis, and hemoptysis.  He developed hemoptysis last summer.  He was treated with some empiric antibiotics.  A CT of the chest showed a new cavitary area peripherally in the right lower lobe superior segment.  Unfortunately,  we were not able to get a good biopsies with bronchoscopy due to severe airway distortion.  Cytologies were negative.  Cultures did grow out staph aureus and Acinetobacter.  He was treated with a Z-Pak and then Augmentin hoping to cover both organisms.  Neither one of those really change the amount of hemoptysis at all.  Back in February I saw him and he was still having a lot of months this.  I prescribed ciprofloxacin 500 mg p.o. twice daily for 2 weeks.  He noticed a rather rapid decrease in the frequency and amount of hemoptysis.  However he continues to see blood in his sputum.  Is now smaller  amounts.  It only occurs about once a week on average but can have multiple episodes on a single day when it does occur.  We discussed options.  I think a reasonable option would be to try a longer course of a lower dose of the ciprofloxacin.  He understands the risks particularly associated with connective tissue disorder such as tendon or ligament ruptures.  He is willing to accept those risks.  Plan: Ciprofloxacin 250 mg p.o. twice daily for 1 month Return in 6 weeks with PA lateral chest  x-ray He will likely need another CT scan in June or July for follow-up.  [name], MD [facility] [phone]

## 2020-12-14 ENCOUNTER — Telehealth: Payer: Self-pay | Admitting: Acute Care

## 2020-12-14 NOTE — Telephone Encounter (Signed)
This is not a lung cancer screening patient.  It was for regular CT chest without contrast.

## 2020-12-14 NOTE — Telephone Encounter (Signed)
FYI - you placed order on 05/22/20 for pt to have chest CT in 6 months - which would be March '22.  I noticed in Feb pt had chest CT from Dr Roxan Hockey on 2/16.  You said at that time to schedule 3 months from CT in Feb.  I called pt today to see where he wanted to go for a CT in May.  He states he does not want to schedule the CT.  He states he is seeing Dr Roxan Hockey now instead of pulmonary.  I told him I would send a message to SG to make aware and I closed out CT order.

## 2020-12-20 ENCOUNTER — Other Ambulatory Visit: Payer: Self-pay | Admitting: Family Medicine

## 2020-12-26 ENCOUNTER — Ambulatory Visit (INDEPENDENT_AMBULATORY_CARE_PROVIDER_SITE_OTHER): Payer: Medicare HMO | Admitting: Family Medicine

## 2020-12-26 ENCOUNTER — Other Ambulatory Visit: Payer: Self-pay

## 2020-12-26 DIAGNOSIS — E785 Hyperlipidemia, unspecified: Secondary | ICD-10-CM | POA: Diagnosis not present

## 2020-12-26 DIAGNOSIS — R739 Hyperglycemia, unspecified: Secondary | ICD-10-CM | POA: Diagnosis not present

## 2020-12-26 DIAGNOSIS — I10 Essential (primary) hypertension: Secondary | ICD-10-CM

## 2020-12-26 DIAGNOSIS — M1990 Unspecified osteoarthritis, unspecified site: Secondary | ICD-10-CM

## 2020-12-26 DIAGNOSIS — R042 Hemoptysis: Secondary | ICD-10-CM

## 2020-12-26 LAB — CBC
HCT: 41.4 % (ref 39.0–52.0)
Hemoglobin: 13.7 g/dL (ref 13.0–17.0)
MCHC: 33.1 g/dL (ref 30.0–36.0)
MCV: 93 fl (ref 78.0–100.0)
Platelets: 247 10*3/uL (ref 150.0–400.0)
RBC: 4.45 Mil/uL (ref 4.22–5.81)
RDW: 13.8 % (ref 11.5–15.5)
WBC: 4.4 10*3/uL (ref 4.0–10.5)

## 2020-12-26 LAB — LIPID PANEL
Cholesterol: 150 mg/dL (ref 0–200)
HDL: 48.2 mg/dL (ref >39.00)
LDL Cholesterol: 67 mg/dL (ref 0–99)
NonHDL: 101.79
Total CHOL/HDL Ratio: 3
Triglycerides: 176 mg/dL — ABNORMAL HIGH (ref 0.0–149.0)
VLDL: 35.2 mg/dL (ref 0.0–40.0)

## 2020-12-26 LAB — COMPREHENSIVE METABOLIC PANEL
ALT: 16 U/L (ref 0–53)
AST: 25 U/L (ref 0–37)
Albumin: 4 g/dL (ref 3.5–5.2)
Alkaline Phosphatase: 79 U/L (ref 39–117)
BUN: 22 mg/dL (ref 6–23)
CO2: 27 mEq/L (ref 19–32)
Calcium: 9.4 mg/dL (ref 8.4–10.5)
Chloride: 104 mEq/L (ref 96–112)
Creatinine, Ser: 0.81 mg/dL (ref 0.40–1.50)
GFR: 87.08 mL/min (ref >60.00)
Glucose, Bld: 80 mg/dL (ref 70–99)
Potassium: 4.6 mEq/L (ref 3.5–5.1)
Sodium: 138 mEq/L (ref 135–145)
Total Bilirubin: 0.9 mg/dL (ref 0.2–1.2)
Total Protein: 7.1 g/dL (ref 6.0–8.3)

## 2020-12-26 LAB — TSH: TSH: 0.57 u[IU]/mL (ref 0.35–4.50)

## 2020-12-26 LAB — HEMOGLOBIN A1C: Hgb A1c MFr Bld: 5.7 % (ref 4.6–6.5)

## 2020-12-26 NOTE — Assessment & Plan Note (Addendum)
Has followed with Dr Roxan Hockey and is doing much better on the Cipro, he took two weeks of 500 mg bid and is now on 250 mg bid x 4 weeks and is tolerating well. Cough is 90 % better and no further hemoptysis is noted. Check cmp today and encouraged to hydrate well. Take an antibiotic for next month

## 2020-12-26 NOTE — Progress Notes (Signed)
Subjective:    Patient ID: [name], male    DOB: 15-Nov-1945, 75 y.o.   MRN: [id number]  Chief Complaint  Patient presents with  . Follow-up  . Hypertension    Pt has no concerns or problems    HPI Patient is in today for follow up on chronic medical concerns. He has found an over the counter mix of tylenol and excedrine manages his arthritis and helps his sleep. He continues to stay very active and he fishes regularly. [place]. No recent febrile illness or hospitalizations. As he finishes a 6 week course of Cipro his Hemoptysis is gone and his cough is 90% better. Denies CP/palp/SOB/HA/congestion/fevers/GI or GU c/o. Taking meds as prescribed  Past Medical History:  Diagnosis Date  . Allergic rhinitis   . Atypical chest pain 12/01/2014  . Back pain 01/12/2013  . COPD (chronic obstructive pulmonary disease) (HCC)    FeV1 64%-2007  . Dyspnea   . Emphysema   . GERD (gastroesophageal reflux disease)   . History of lung abscess    bronchiectasis with RMLandRLL ersection -1996- Dr Arlyce Dice  . Hordeolum externum (stye) 04/23/2015   Right eye  . Hypertension   . Impaired vision    glasses  . Osteoarthritis    hands and knees  . Sinusitis, acute 04/23/2015    Past Surgical History:  Procedure Laterality Date  . HERNIA REPAIR  10/2009  . left knee    . lower back surgery  09/2008   06/2009  . LUNG SURGERY     RML andRUL removed due to bleeding and bronchiectasis and lung abcess  . NECK SURGERY  05/2008  . right foot surgery    . VIDEO BRONCHOSCOPY N/A 07/12/2020   Procedure: VIDEO BRONCHOSCOPY;  Surgeon: [name], MD;  Location: [place];  Service: Thoracic;  Laterality: N/A;  . VIDEO BRONCHOSCOPY WITH ENDOBRONCHIAL NAVIGATION N/A 07/12/2020   Procedure: VIDEO BRONCHOSCOPY WITH ENDOBRONCHIAL NAVIGATION;  Surgeon: [name], MD;  Location: [facility];  Service: Thoracic;  Laterality: N/A;    Family History  Problem Relation Age of Onset  . Prostate  cancer Father        father died prostate ca  . Coronary artery disease Other        1st degree relative<60  . Stroke Other        1st degree relative<50  . Heart failure Mother        age 25  . Colon cancer Paternal Grandmother   . Obstructive Sleep Apnea Brother   . Obesity Brother     Social History   Socioeconomic History  . Marital status: Married    Spouse name: Not on file  . Number of children: Not on file  . Years of education: Not on file  . Highest education level: Not on file  Occupational History  . Occupation: retired Music therapist  Tobacco Use  . Smoking status: Former Smoker    Packs/day: 1.50    Years: 38.00    Pack years: 57.00    Types: Cigarettes    Quit date: 09/03/2000    Years since quitting: 20.3  . Smokeless tobacco: Never Used  Substance and Sexual Activity  . Alcohol use: Yes    Alcohol/week: 2.0 - 3.0 standard drinks    Types: 2 - 3 Cans of beer per week  . Drug use: No  . Sexual activity: Never  Other Topics Concern  . Not on file  Social  History Narrative   Retired Music therapist   Patient states former smoker. 1 1/2 ppd x 38 yrs  Quit in Jan 2002   Married - 2 weeks (4th marriage)   divorced,  remarried 84-2004 (lost wife to lung ca),  remarried (divorced),    1 son  - 63 [name])   Alcohol use-yes (2-3 beers per week)           Social Determinants of Health   Financial Resource Strain: Low Risk   . Difficulty of Paying Living Expenses: Not hard at all  Food Insecurity: No Food Insecurity  . Worried About Charity fundraiser in the Last Year: Never true  . Ran Out of Food in the Last Year: Never true  Transportation Needs: No Transportation Needs  . Lack of Transportation (Medical): No  . Lack of Transportation (Non-Medical): No  Physical Activity: Not on file  Stress: Not on file  Social Connections: Not on file  Intimate Partner Violence: Not on file    Outpatient Medications Prior to Visit  Medication Sig  Dispense Refill  . acetaminophen (TYLENOL) 650 MG CR tablet Take 1,300 mg by mouth every 8 (eight) hours as needed for pain.    Marland Kitchen albuterol (VENTOLIN HFA) 108 (90 Base) MCG/ACT inhaler INHALE 2 PUFFS INTO THE LUNGS EVERY 6 (SIX) HOURS AS NEEDED FOR WHEEZING OR SHORTNESS OF BREATH. 36 g 2  . amLODipine (NORVASC) 5 MG tablet TAKE 1 TABLET EVERY DAY 90 tablet 1  . calcium elemental as carbonate (BARIATRIC TUMS ULTRA) 400 MG chewable tablet Chew 1,000 mg by mouth 2 (two) times daily.     . Capsaicin-Menthol (SALONPAS GEL EX) Apply 1 application topically daily as needed (arthritis pain).    . ciprofloxacin (CIPRO) 250 MG tablet Take 1 tablet (250 mg total) by mouth 2 (two) times daily for 28 days. 56 tablet 0  . diclofenac Sodium (VOLTAREN) 1 % GEL Apply 2 g topically 3 (three) times daily as needed (pain).    . fluticasone (FLONASE) 50 MCG/ACT nasal spray USE 2 SPRAYS IN EACH NOSTRIL EVERY DAY AS NEEDED FOR ALLERGIES OR RHINITIS (Patient taking differently: Place 2 sprays into both nostrils daily.) 48 g 3  . Glucosamine-Chondroitin 750-600 MG TABS Take 1 tablet by mouth 2 (two) times daily.    . Guaifenesin 1200 MG TB12 Take 600 mg by mouth 2 (two) times daily.    . imiquimod (ALDARA) 5 % cream Apply topically 3 (three) times a week. 12 each 1  . loratadine (CLARITIN) 10 MG tablet Take 10 mg by mouth daily.    . meloxicam (MOBIC) 15 MG tablet TAKE 1 TABLET EVERY DAY AS NEEDED FOR PAIN 90 tablet 1  . Menthol (ROBITUSSIN COUGH DROPS MT) Use as directed 1 lozenge in the mouth or throat 4 (four) times daily as needed (cough).     . Menthol, Topical Analgesic, (BLUE-EMU MAXIMUM STRENGTH EX) Apply 1 application topically daily as needed (arthritis pain).     . montelukast (SINGULAIR) 10 MG tablet TAKE 1 TABLET EVERY DAY 90 tablet 3  . Multiple Vitamin (MULTIVITAMIN WITH MINERALS) TABS tablet Take 1 tablet by mouth daily.     . TRELEGY ELLIPTA 100-62.5-25 MCG/INH AEPB INHALE 1 PUFF INTO THE LUNGS DAILY 180  each 1  . Zoster Vaccine Adjuvanted [facility]) injection INJECT AS DIRECTED 1 each 1   No facility-administered medications prior to visit.    Allergies  Allergen Reactions  . Losartan Other (See Comments)  weakness    Review of Systems  Constitutional: Negative for fever and malaise/fatigue.  HENT: Negative for congestion.   Eyes: Negative for blurred vision.  Respiratory: Positive for cough. Negative for hemoptysis and shortness of breath.   Cardiovascular: Negative for chest pain, palpitations and leg swelling.  Gastrointestinal: Negative for abdominal pain, blood in stool and nausea.  Genitourinary: Negative for dysuria and frequency.  Musculoskeletal: Positive for joint pain. Negative for falls.  Skin: Negative for rash.  Neurological: Negative for dizziness, loss of consciousness and headaches.  Endo/Heme/Allergies: Negative for environmental allergies.  Psychiatric/Behavioral: Negative for depression. The patient is not nervous/anxious.        Objective:    Physical Exam Vitals and nursing note reviewed.  Constitutional:      General: He is not in acute distress.    Appearance: He is well-developed.  HENT:     Head: Normocephalic and atraumatic.     Nose: Nose normal.  Eyes:     General:        Right eye: No discharge.        Left eye: No discharge.  Cardiovascular:     Rate and Rhythm: Normal rate and regular rhythm.     Heart sounds: No murmur heard.   Pulmonary:     Effort: Pulmonary effort is normal.     Breath sounds: Normal breath sounds.  Abdominal:     General: Bowel sounds are normal.     Palpations: Abdomen is soft.     Tenderness: There is no abdominal tenderness.  Musculoskeletal:     Cervical back: Normal range of motion and neck supple.  Skin:    General: Skin is warm and dry.  Neurological:     Mental Status: He is alert and oriented to person, place, and time.     There were no vitals taken for this visit. Wt Readings from Last  3 Encounters:  12/05/20 146 lb (66.2 kg)  10/24/20 150 lb (68 kg)  08/29/20 148 lb (67.1 kg)    Diabetic Foot Exam - Simple   Simple Foot Form Visual Inspection No deformities, no ulcerations, no other skin breakdown bilaterally: Yes Sensation Testing Intact to touch and monofilament testing bilaterally: Yes Pulse Check Posterior Tibialis and Dorsalis pulse intact bilaterally: Yes Comments    Lab Results  Component Value Date   WBC 5.1 09/20/2020   HGB 13.9 09/20/2020   HCT 42.3 09/20/2020   PLT 244.0 09/20/2020   GLUCOSE 71 09/20/2020   CHOL 153 09/20/2020   TRIG 184.0 (H) 09/20/2020   HDL 47.10 09/20/2020   LDLDIRECT 85 07/22/2014   LDLCALC 70 09/20/2020   ALT 13 09/20/2020   AST 21 09/20/2020   NA 137 09/20/2020   K 4.5 09/20/2020   CL 103 09/20/2020   CREATININE 0.88 09/20/2020   BUN 23 09/20/2020   CO2 28 09/20/2020   TSH 0.50 06/26/2020   PSA 2.55 12/23/2019   INR 1.1 07/10/2020   HGBA1C 5.4 06/26/2020    Lab Results  Component Value Date   TSH 0.50 06/26/2020   Lab Results  Component Value Date   WBC 5.1 09/20/2020   HGB 13.9 09/20/2020   HCT 42.3 09/20/2020   MCV 94.2 09/20/2020   PLT 244.0 09/20/2020   Lab Results  Component Value Date   NA 137 09/20/2020   K 4.5 09/20/2020   CO2 28 09/20/2020   GLUCOSE 71 09/20/2020   BUN 23 09/20/2020   CREATININE 0.88 09/20/2020   BILITOT  0.7 09/20/2020   ALKPHOS 79 09/20/2020   AST 21 09/20/2020   ALT 13 09/20/2020   PROT 7.1 09/20/2020   ALBUMIN 4.3 09/20/2020   CALCIUM 10.0 09/20/2020   ANIONGAP 12 07/16/2020   GFR 85.09 09/20/2020   Lab Results  Component Value Date   CHOL 153 09/20/2020   Lab Results  Component Value Date   HDL 47.10 09/20/2020   Lab Results  Component Value Date   LDLCALC 70 09/20/2020   Lab Results  Component Value Date   TRIG 184.0 (H) 09/20/2020   Lab Results  Component Value Date   CHOLHDL 3 09/20/2020   Lab Results  Component Value Date   HGBA1C 5.4  06/26/2020       Assessment & Plan:   Problem List Items Addressed This Visit    Benign essential HTN    Well controlled, no changes to meds. Encouraged heart healthy diet such as the DASH diet and exercise as tolerated.       Relevant Orders   TSH   Arthritis    Doing better on Tylenol 1300 mg bid and 1 excedrine migraine daily. Stay active      Hyperglycemia    hgba1c acceptable, minimize simple carbs. Increase exercise as tolerated.       Relevant Orders   Hemoglobin A1c   Comprehensive metabolic panel   Hyperlipidemia    Encouraged heart healthy diet, increase exercise, avoid trans fats, consider a krill oil cap daily      Relevant Orders   Lipid panel   Hemoptysis    Has followed with Dr Roxan Hockey and is doing much better on the Cipro, he took two weeks of 500 mg bid and is now on 250 mg bid x 4 weeks and is tolerating well. Cough is 90 % better and no further hemoptysis is noted. Check cmp today and encouraged to hydrate well. Take an antibiotic for next month      Relevant Orders   CBC      I have discontinued Ledell S. Germano "Al"'s Zoster Vaccine Adjuvanted. I am also having him maintain his loratadine, Menthol (ROBITUSSIN COUGH DROPS MT), Guaifenesin, Glucosamine-Chondroitin, calcium elemental as carbonate, multivitamin with minerals, Capsaicin-Menthol (SALONPAS GEL EX), (Menthol, Topical Analgesic, (BLUE-EMU MAXIMUM STRENGTH EX)), imiquimod, fluticasone, albuterol, acetaminophen, diclofenac Sodium, montelukast, meloxicam, amLODipine, ciprofloxacin, and Trelegy Ellipta.  No orders of the defined types were placed in this encounter.    Penni Homans, MD

## 2020-12-26 NOTE — Assessment & Plan Note (Signed)
Encouraged heart healthy diet, increase exercise, avoid trans fats, consider a krill oil cap daily 

## 2020-12-26 NOTE — Assessment & Plan Note (Signed)
Doing better on Tylenol 1300 mg bid and 1 excedrine migraine daily. Stay active

## 2020-12-26 NOTE — Assessment & Plan Note (Signed)
hgba1c acceptable, minimize simple carbs. Increase exercise as tolerated.  

## 2020-12-26 NOTE — Assessment & Plan Note (Signed)
Well controlled, no changes to meds. Encouraged heart healthy diet such as the DASH diet and exercise as tolerated.  °

## 2020-12-26 NOTE — Patient Instructions (Signed)
Arthritis Arthritis is a term that is commonly used to refer to joint pain or joint disease. There are more than 100 types of arthritis. What are the causes? The most common cause of this condition is wear and tear of a joint. Other causes include:  Gout.  Inflammation of a joint.  An infection of a joint.  Sprains and other injuries near the joint.  A reaction to medicines or drugs, or an allergic reaction. In some cases, the cause may not be known. What are the signs or symptoms? The main symptom of this condition is pain in the joint during movement. Other symptoms include:  Redness, swelling, or stiffness at a joint.  Warmth coming from the joint.  Fever.  Overall feeling of illness. How is this diagnosed? This condition may be diagnosed with a physical exam and tests, including:  Blood tests.  Urine tests.  Imaging tests, such as X-rays, an MRI, or a CT scan. Sometimes, fluid is removed from a joint for testing. How is this treated? This condition may be treated with:  Treatment of the cause, if it is known.  Rest.  Raising (elevating) the joint.  Applying cold or hot packs to the joint.  Medicines to improve symptoms and reduce inflammation.  Injections of a steroid such as cortisone into the joint to help reduce pain and inflammation. Depending on the cause of your arthritis, you may need to make lifestyle changes to reduce stress on your joint. Changes may include:  Exercising more.  Losing weight. Follow these instructions at home: Medicines  Take over-the-counter and prescription medicines only as told by your health care provider.  Do not take aspirin to relieve pain if your health care provider thinks that gout may be causing your pain. Activity  Rest your joint if told by your health care provider. Rest is important when your disease is active and your joint feels painful, swollen, or stiff.  Avoid activities that make the pain worse. It is  important to balance activity with rest.  Exercise your joint regularly with range-of-motion exercises as told by your health care provider. Try doing low-impact exercise, such as: ? Swimming. ? Water aerobics. ? Biking. ? Walking. Managing pain, stiffness, and swelling  If directed, put ice on the joint. ? Put ice in a plastic bag. ? Place a towel between your skin and the bag. ? Leave the ice on for 20 minutes, 2-3 times per day.  If your joint is swollen, raise (elevate) it above the level of your heart if directed by your health care provider.  If your joint feels stiff in the morning, try taking a warm shower.  If directed, apply heat to the affected area as often as told by your health care provider. Use the heat source that your health care provider recommends, such as a moist heat pack or a heating pad. If you have diabetes, do not apply heat without permission from your health care provider. To apply heat: ? Place a towel between your skin and the heat source. ? Leave the heat on for 20-30 minutes. ? Remove the heat if your skin turns bright red. This is especially important if you are unable to feel pain, heat, or cold. You may have a greater risk of getting burned.      General instructions  Do not use any products that contain nicotine or tobacco, such as cigarettes, e-cigarettes, and chewing tobacco. If you need help quitting, ask your health care provider.    Keep all follow-up visits as told by your health care provider. This is important. Contact a health care provider if:  The pain gets worse.  You have a fever. Get help right away if:  You develop severe joint pain, swelling, or redness.  Many joints become painful and swollen.  You develop severe back pain.  You develop severe weakness in your leg.  You cannot control your bladder or bowels. Summary  Arthritis is a term that is commonly used to refer to joint pain or joint disease. There are more than  100 types of arthritis.  The most common cause of this condition is wear and tear of a joint. Other causes include gout, inflammation or infection of the joint, sprains, or allergies.  Symptoms of this condition include redness, swelling, or stiffness of the joint. Other symptoms include warmth, fever, or feeling ill.  This condition is treated with rest, elevation, medicines, and applying cold or hot packs.  Follow your health care provider's instructions about medicines, activity, exercises, and other home care treatments. This information is not intended to replace advice given to you by your health care provider. Make sure you discuss any questions you have with your health care provider. Document Revised: 07/27/2018 Document Reviewed: 07/27/2018 Elsevier Patient Education  2021 Elsevier Inc.  

## 2021-01-09 ENCOUNTER — Other Ambulatory Visit: Payer: Self-pay

## 2021-01-09 ENCOUNTER — Telehealth: Payer: Self-pay | Admitting: Family Medicine

## 2021-01-09 DIAGNOSIS — Z1211 Encounter for screening for malignant neoplasm of colon: Secondary | ICD-10-CM

## 2021-01-09 NOTE — Telephone Encounter (Signed)
Yes please refer him to [facility] for screening colonoscopy

## 2021-01-09 NOTE — Telephone Encounter (Signed)
Referral placed.

## 2021-01-09 NOTE — Telephone Encounter (Signed)
Patient's calling to give opthalmology & GI doc info.  [facility]  2- colonoscopy - patient does not have any records/ cant remember where he got done

## 2021-01-09 NOTE — Telephone Encounter (Signed)
Requesting DM exam for ophthalmologist, but do you want [place] to advise him to call [facility] for colonoscopy or do referral. Last that I see was 2012

## 2021-01-10 NOTE — Telephone Encounter (Signed)
Left detailed message on machine to call back. 

## 2021-01-10 NOTE — Telephone Encounter (Signed)
Pt is aware.  

## 2021-01-11 ENCOUNTER — Other Ambulatory Visit: Payer: Self-pay | Admitting: Thoracic Surgery (Cardiothoracic Vascular Surgery)

## 2021-01-11 DIAGNOSIS — R911 Solitary pulmonary nodule: Secondary | ICD-10-CM

## 2021-01-16 ENCOUNTER — Ambulatory Visit
Admission: RE | Admit: 2021-01-16 | Discharge: 2021-01-16 | Disposition: A | Discharge status: Home / Self Care | Payer: Medicare HMO | Source: Ambulatory Visit | Attending: Thoracic Surgery (Cardiothoracic Vascular Surgery) | Admitting: Thoracic Surgery (Cardiothoracic Vascular Surgery)

## 2021-01-16 ENCOUNTER — Encounter: Payer: Self-pay | Admitting: Gastroenterology

## 2021-01-16 ENCOUNTER — Other Ambulatory Visit: Payer: Self-pay

## 2021-01-16 ENCOUNTER — Ambulatory Visit: Payer: Medicare HMO | Admitting: Thoracic Surgery (Cardiothoracic Vascular Surgery)

## 2021-01-16 VITALS — BP 178/88 | HR 76 | Resp 20 | Ht 67.0 in | Wt 146.0 lb

## 2021-01-16 DIAGNOSIS — R911 Solitary pulmonary nodule: Secondary | ICD-10-CM | POA: Diagnosis not present

## 2021-01-16 DIAGNOSIS — R918 Other nonspecific abnormal finding of lung field: Secondary | ICD-10-CM | POA: Diagnosis not present

## 2021-01-16 DIAGNOSIS — R042 Hemoptysis: Secondary | ICD-10-CM | POA: Diagnosis not present

## 2021-01-16 DIAGNOSIS — J929 Pleural plaque without asbestos: Secondary | ICD-10-CM | POA: Diagnosis not present

## 2021-01-16 DIAGNOSIS — Z9889 Other specified postprocedural states: Secondary | ICD-10-CM | POA: Diagnosis not present

## 2021-01-16 NOTE — Progress Notes (Signed)
[address]       [place] [address]             [phone]     HPI: Mr. Golab returns for a scheduled follow-up visit  L. Gehl is a 75 year old man with a history of right upper and middle bilobectomy for bronchiectasis and abscess in 1996, tobacco abuse, COPD, hypertension, reflux, arthritis, and hemoptysis.  He presented with hemoptysis in July 2021.  Initially was treated with a Z-Pak but that had no effect.  A CT of the chest in September showed a cystic area in the superior aspect of the right lower lobe.  It was unclear if this was parenchymal or pleural-based.  6-week follow-up there was a slight enlargement of that area.  I did a navigational bronchoscopy in November 2021.  There was severe airway distortion due to his previous surgery.  We did not get good biopsies but BAL grew out staph and Acinetobacter.  Treatment of those with a Z-Pak and with Augmentin really had no effect.  We did a 2-week course of ciprofloxacin in February and his hemoptysis improved significantly.  We then gave him a 4-week course of Cipro 250 mg twice daily.  He says that his hemoptysis has resolved completely.  He has not coughed up any blood for about a month.  He has had some significant cough and congestion over the past week after doing some yard work.  Past Medical History:  Diagnosis Date  . Allergic rhinitis   . Atypical chest pain 12/01/2014  . Back pain 01/12/2013  . COPD (chronic obstructive pulmonary disease) (HCC)    FeV1 64%-2007  . Dyspnea   . Emphysema   . GERD (gastroesophageal reflux disease)   . History of lung abscess    bronchiectasis with RMLandRLL ersection -1996- Dr Arlyce Dice  . Hordeolum externum (stye) 04/23/2015   Right eye  . Hypertension   . Impaired vision    glasses  . Osteoarthritis    hands and knees  . Sinusitis, acute 04/23/2015    Current Outpatient Medications  Medication Sig Dispense Refill  . acetaminophen (TYLENOL) 650 MG CR tablet Take  1,300 mg by mouth every 8 (eight) hours as needed for pain.    Marland Kitchen albuterol (VENTOLIN HFA) 108 (90 Base) MCG/ACT inhaler INHALE 2 PUFFS INTO THE LUNGS EVERY 6 (SIX) HOURS AS NEEDED FOR WHEEZING OR SHORTNESS OF BREATH. 36 g 2  . amLODipine (NORVASC) 5 MG tablet TAKE 1 TABLET EVERY DAY 90 tablet 1  . calcium elemental as carbonate (BARIATRIC TUMS ULTRA) 400 MG chewable tablet Chew 1,000 mg by mouth 2 (two) times daily.     . Capsaicin-Menthol (SALONPAS GEL EX) Apply 1 application topically daily as needed (arthritis pain).    Marland Kitchen diclofenac Sodium (VOLTAREN) 1 % GEL Apply 2 g topically 3 (three) times daily as needed (pain).    . fluticasone (FLONASE) 50 MCG/ACT nasal spray USE 2 SPRAYS IN EACH NOSTRIL EVERY DAY AS NEEDED FOR ALLERGIES OR RHINITIS (Patient taking differently: Place 2 sprays into both nostrils daily.) 48 g 3  . Glucosamine-Chondroitin 750-600 MG TABS Take 1 tablet by mouth 2 (two) times daily.    . Guaifenesin 1200 MG TB12 Take 600 mg by mouth 2 (two) times daily.    . imiquimod (ALDARA) 5 % cream Apply topically 3 (three) times a week. 12 each 1  . loratadine (CLARITIN) 10 MG tablet Take 10 mg by mouth daily.    Marland Kitchen  meloxicam (MOBIC) 15 MG tablet TAKE 1 TABLET EVERY DAY AS NEEDED FOR PAIN 90 tablet 1  . Menthol (ROBITUSSIN COUGH DROPS MT) Use as directed 1 lozenge in the mouth or throat 4 (four) times daily as needed (cough).     . Menthol, Topical Analgesic, (BLUE-EMU MAXIMUM STRENGTH EX) Apply 1 application topically daily as needed (arthritis pain).     . montelukast (SINGULAIR) 10 MG tablet TAKE 1 TABLET EVERY DAY 90 tablet 3  . Multiple Vitamin (MULTIVITAMIN WITH MINERALS) TABS tablet Take 1 tablet by mouth daily.     . TRELEGY ELLIPTA 100-62.5-25 MCG/INH AEPB INHALE 1 PUFF INTO THE LUNGS DAILY 180 each 1   No current facility-administered medications for this visit.    Physical Exam BP (!) 178/88   Pulse 76   Resp 20   Ht _0  (1.702 m)   Wt 146 lb (66.2 kg)   SpO2 95%  Comment: RA  BMI 22.23 kg/m  75 year old man in no acute distress Alert and oriented x3 with no focal deficits Lungs diminished at right base slight congestion left upper lobe No cervical she clavicular adenopathy Cardiac regular rate and rhythm  Diagnostic Tests: CHEST - 2 VIEW  COMPARISON:  Radiograph 12/05/2020, chest CT 10/18/2020  FINDINGS: Unchanged cardiomediastinal silhouette. Stable postsurgical changes in the right hilar region with associated right apical and basilar scarring. Unchanged right apical loculated gas and pleural thickening. There is no new focal airspace disease. Partially visualized cervical spine in lumbar spine fusion hardware.  IMPRESSION: No evidence of acute cardiopulmonary disease. Stable postsurgical changes on the right with unchanged appearance of the lungs and pleura.   Electronically Signed   By: Maurine Simmering   On: 01/16/2021 11:30 I personally reviewed the chest x-ray.  I agree there is been no interval change from his previous film.  Impression: Jacaden" Al" Yankee is a 75 year old man with a history of right upper and middle bilobectomy for bronchiectasis and abscess in 1996, tobacco abuse, COPD, hypertension, reflux, arthritis, and hemoptysis.  Hemoptysis-first noted in July 2021.  Was persistent for about 9 months.  Work-up including bronchoscopy revealed Acinetobacter infection.  No response to Z-Pak or Augmentin.  Hemoptysis improved with ciprofloxacin over 2-week time course.  Then was treated with low-dose Cipro for a month and his hemoptysis is now resolved altogether.  Obviously we do not know for certain the source of the hemoptysis.  He had quite a bit at various times.  He does have some significant airway distortion due to his previous surgery.  There also has been cavitary lesion at the right apex that is unclear if its within the superior segment or pleural-based.  I think we should repeat another CT in about 2 months to  check on those findings.   Plan: Return in 2 months with CT chest  I spent 15 minutes in review of records, images, and in consultation with Mr. Dunavan today. [name], MD [facility] [phone]

## 2021-02-12 ENCOUNTER — Other Ambulatory Visit: Payer: Self-pay | Admitting: *Deleted

## 2021-02-12 DIAGNOSIS — J984 Other disorders of lung: Secondary | ICD-10-CM

## 2021-02-23 ENCOUNTER — Telehealth: Payer: Self-pay | Admitting: Family Medicine

## 2021-02-23 NOTE — Telephone Encounter (Signed)
Copied from [place] [phone]. Topic: Medicare AWV >> Feb 23, 2021 11:05 AM Harris-Coley, Hannah Beat wrote: Reason for CRM: Left message for patient to schedule Annual Wellness Visit.  Please schedule with Health Nurse Advisor Augustine Radar. at [facility].

## 2021-02-27 NOTE — Progress Notes (Signed)
Subjective:   [name] is a 75 y.o. male who presents for Medicare Annual/Subsequent preventive examination.  I connected with Shafiq today by telephone and verified that I am speaking with the correct person using two identifiers. Location patient: home Location provider: work Persons participating in the virtual visit: patient, Marine scientist.    I discussed the limitations, risks, security and privacy concerns of performing an evaluation and management service by telephone and the availability of in person appointments. I also discussed with the patient that there may be a patient responsible charge related to this service. The patient expressed understanding and verbally consented to this telephonic visit.    Interactive audio and video telecommunications were attempted between this provider and patient, however failed, due to patient having technical difficulties OR patient did not have access to video capability.  We continued and completed visit with audio only.  Some vital signs may be absent or patient reported.   Time Spent with patient on telephone encounter: 20 minutes   Review of Systems     Cardiac Risk Factors include: advanced age (>75men, >36 women);male gender;dyslipidemia;hypertension     Objective:    Today's Vitals   02/28/21 0820  Weight: 146 lb (66.2 kg)  Height: 5\' 7"  (1.702 m)  PainSc: 3    Body mass index is 22.87 kg/m.  Advanced Directives 02/28/2021 07/10/2020 02/28/2020 09/30/2016 09/28/2016 09/10/2016 05/25/2015  Does Patient Have a Medical Advance Directive? Yes Yes Yes - Yes Yes Yes  Type of Advance Directive [place];Living will Living will [place];Living will Healthcare Power of [place];Living will  Does patient want to make changes to medical advance directive? - - No - Patient declined - - - -  Copy of [place] in Chart? Yes -  validated most recent copy scanned in chart (See row information) - Yes - validated most recent copy scanned in chart (See row information) - - No - copy requested -    Current Medications (verified) Outpatient Encounter Medications as of 02/28/2021  Medication Sig   acetaminophen (TYLENOL) 650 MG CR tablet Take 1,300 mg by mouth every 8 (eight) hours as needed for pain.   albuterol (VENTOLIN HFA) 108 (90 Base) MCG/ACT inhaler INHALE 2 PUFFS INTO THE LUNGS EVERY 6 (SIX) HOURS AS NEEDED FOR WHEEZING OR SHORTNESS OF BREATH.   amLODipine (NORVASC) 5 MG tablet TAKE 1 TABLET EVERY DAY   calcium elemental as carbonate (BARIATRIC TUMS ULTRA) 400 MG chewable tablet Chew 1,000 mg by mouth 2 (two) times daily.    Capsaicin-Menthol (SALONPAS GEL EX) Apply 1 application topically daily as needed (arthritis pain).   diclofenac Sodium (VOLTAREN) 1 % GEL Apply 2 g topically 3 (three) times daily as needed (pain).   fluticasone (FLONASE) 50 MCG/ACT nasal spray USE 2 SPRAYS IN EACH NOSTRIL EVERY DAY AS NEEDED FOR ALLERGIES OR RHINITIS (Patient taking differently: Place 2 sprays into both nostrils daily.)   Glucosamine-Chondroitin 750-600 MG TABS Take 1 tablet by mouth 2 (two) times daily.   Guaifenesin 1200 MG TB12 Take 600 mg by mouth 2 (two) times daily.   imiquimod (ALDARA) 5 % cream Apply topically 3 (three) times a week.   loratadine (CLARITIN) 10 MG tablet Take 10 mg by mouth daily.   meloxicam (MOBIC) 15 MG tablet TAKE 1 TABLET EVERY DAY AS NEEDED FOR PAIN   Menthol (ROBITUSSIN COUGH DROPS MT) Use as directed 1 lozenge in  the mouth or throat 4 (four) times daily as needed (cough).    Menthol, Topical Analgesic, (BLUE-EMU MAXIMUM STRENGTH EX) Apply 1 application topically daily as needed (arthritis pain).    montelukast (SINGULAIR) 10 MG tablet TAKE 1 TABLET EVERY DAY   Multiple Vitamin (MULTIVITAMIN WITH MINERALS) TABS tablet Take 1 tablet by mouth daily.    TRELEGY ELLIPTA 100-62.5-25 MCG/INH AEPB INHALE  1 PUFF INTO THE LUNGS DAILY   No facility-administered encounter medications on file as of 02/28/2021.    Allergies (verified) Losartan   History: Past Medical History:  Diagnosis Date   Allergic rhinitis    Atypical chest pain 12/01/2014   Back pain 01/12/2013   COPD (chronic obstructive pulmonary disease) (HCC)    FeV1 64%-2007   Dyspnea    Emphysema    GERD (gastroesophageal reflux disease)    History of lung abscess    bronchiectasis with RMLandRLL ersection -1996- Dr Arlyce Dice   Hordeolum externum (stye) 04/23/2015   Right eye   Hypertension    Impaired vision    glasses   Osteoarthritis    hands and knees   Sinusitis, acute 04/23/2015   Past Surgical History:  Procedure Laterality Date   HERNIA REPAIR  10/2009   left knee     lower back surgery  09/2008   06/2009   LUNG SURGERY     RML andRUL removed due to bleeding and bronchiectasis and lung abcess   NECK SURGERY  05/2008   right foot surgery     VIDEO BRONCHOSCOPY N/A 07/12/2020   Procedure: VIDEO BRONCHOSCOPY;  Surgeon: [name], MD;  Location: [facility] OR;  Service: Thoracic;  Laterality: N/A;   VIDEO BRONCHOSCOPY WITH ENDOBRONCHIAL NAVIGATION N/A 07/12/2020   Procedure: VIDEO BRONCHOSCOPY WITH ENDOBRONCHIAL NAVIGATION;  Surgeon: [name], MD;  Location: [facility] OR;  Service: Thoracic;  Laterality: N/A;   Family History  Problem Relation Age of Onset   Prostate cancer Father        father died prostate ca   Coronary artery disease Other        1st degree relative<60   Stroke Other        1st degree relative<50   Heart failure Mother        age 31   Colon cancer Paternal Grandmother    Obstructive Sleep Apnea Brother    Obesity Brother    Social History   Socioeconomic History   Marital status: Married    Spouse name: Not on file   Number of children: Not on file   Years of education: Not on file   Highest education level: Not on file  Occupational History   Occupation: retired Administrator  Tobacco Use   Smoking status: Former    Packs/day: 1.50    Years: 38.00    Pack years: 57.00    Types: Cigarettes    Quit date: 09/03/2000    Years since quitting: 20.5   Smokeless tobacco: Never  Substance and Sexual Activity   Alcohol use: Yes    Alcohol/week: 2.0 - 3.0 standard drinks    Types: 2 - 3 Cans of beer per week   Drug use: No   Sexual activity: Never  Other Topics Concern   Not on file  Social History Narrative   Retired Music therapist   Patient states former smoker. 1 1/2 ppd x 38 yrs  Quit in Jan 2002   Married - 2 weeks (4th marriage)   divorced,  remarried 108-2004 (lost wife to lung ca),  remarried (divorced),    1 son  - 8 13)   Alcohol use-yes (2-3 beers per week)           Social Determinants of Health   Financial Resource Strain: Low Risk    Difficulty of Paying Living Expenses: Not very hard  Food Insecurity: No Food Insecurity   Worried About Charity fundraiser in the Last Year: Never true   Arboriculturist in the Last Year: Never true  Transportation Needs: No Transportation Needs   Lack of Transportation (Medical): No   Lack of Transportation (Non-Medical): No  Physical Activity: Sufficiently Active   Days of Exercise per Week: 6 days   Minutes of Exercise per Session: 30 min  Stress: No Stress Concern Present   Feeling of Stress : Not at all  Social Connections: Moderately Integrated   Frequency of Communication with Friends and Family: More than three times a week   Frequency of Social Gatherings with Friends and Family: More than three times a week   Attends Religious Services: More than 4 times per year   Active Member of [employer] or Organizations: No   Attends Music therapist: Never   Marital Status: Married    Tobacco Counseling Counseling given: Not Answered   Clinical Intake:  Pre-visit preparation completed: Yes  Pain : 0-10 Pain Score: 3  Pain Type: Chronic pain Pain Location:  Knee (hip & shoulders) Pain Onset: More than a month ago Pain Frequency: Constant     Nutritional Status: BMI of 19-24  Normal Nutritional Risks: None Diabetes: No  How often do you need to have someone help you when you read instructions, pamphlets, or other written materials from your doctor or pharmacy?: 1 - Never  Diabetic?No  Interpreter Needed?: No  Information entered by :: Caroleen Hamman LPN   Activities of Daily Living In your present state of health, do you have any difficulty performing the following activities: 02/28/2021 07/10/2020  Hearing? N N  Vision? N N  Difficulty concentrating or making decisions? N N  Walking or climbing stairs? N N  Dressing or bathing? N N  Doing errands, shopping? N N  Preparing Food and eating ? N -  Using the Toilet? N -  In the past six months, have you accidently leaked urine? N -  Do you have problems with loss of bowel control? N -  Managing your Medications? N -  Managing your Finances? N -  Housekeeping or managing your Housekeeping? N -  Some recent data might be hidden    Patient Care Team: Mosie Lukes, MD as PCP - General (Family Medicine) Josue Hector, MD as PCP - Cardiology (Cardiology) Elsie Stain, MD as Attending Physician (Pulmonary Disease)  Indicate any recent Medical Services you may have received from other than [facility] providers in the past year (date may be approximate).     Assessment:   This is a routine wellness examination for Journee.  Hearing/Vision screen Hearing Screening - Comments:: No issues Vision Screening - Comments:: [facility]  Dietary issues and exercise activities discussed: Current Exercise Habits: Home exercise routine, Type of exercise: stretching;strength training/weights, Time (Minutes): 30, Frequency (Times/Week): 6, Weekly Exercise (Minutes/Week): 180, Intensity: Mild, Exercise limited by: None identified   Goals Addressed              This Visit's Progress    maintain healthy lifestyle.  On track      Depression Screen PHQ 2/9 Scores 02/28/2021 02/28/2020 12/27/2019 09/15/2017 09/10/2016 11/07/2014 07/22/2014  PHQ - 2 Score 0 0 0 0 0 0 0  PHQ- 9 Score - - 0 - - - -    Fall Risk Fall Risk  02/28/2021 02/28/2020 09/15/2017 09/10/2016 11/07/2014  Falls in the past year? 1 0 No Yes Yes  Comment - - - when getting out of kayak -  Number falls in past yr: 1 0 - 1 1  Injury with Fall? 0 0 - No No  Risk for fall due to : History of fall(s) - - - -  Follow up Falls prevention discussed Education provided;Falls prevention discussed - Falls prevention discussed;Education provided -    FALL RISK PREVENTION PERTAINING TO THE HOME:  Any stairs in or around the home? Yes  If so, are there any without handrails? No  Home free of loose throw rugs in walkways, pet beds, electrical cords, etc? Yes  Adequate lighting in your home to reduce risk of falls? Yes   ASSISTIVE DEVICES UTILIZED TO PREVENT FALLS:  Life alert? No  Use of a cane, walker or w/c? No  Grab bars in the bathroom? Yes  Shower chair or bench in shower? No  Elevated toilet seat or a handicapped toilet? No   TIMED UP AND GO:  Was the test performed? No . Phone visit   Cognitive Function:Normal cognitive status assessed by this Nurse Health Advisor. No abnormalities found.   MMSE - Mini Mental State Exam 09/10/2016  Orientation to time 5  Orientation to Place 5  Registration 3  Attention/ Calculation 5  Recall 3  Language- name 2 objects 2  Language- repeat 1  Language- follow 3 step command 3  Language- read & follow direction 1  Write a sentence 1  Copy design 1  Total score 30        Immunizations Immunization History  Administered Date(s) Administered   Influenza Split 06/02/2012   Influenza Whole 05/15/2010, 05/02/2011, 05/18/2013   Influenza, High Dose Seasonal PF 04/20/2017, 05/05/2018, 04/30/2019   Influenza,inj,Quad PF,6+ Mos 05/14/2014,  05/25/2015   Influenza-Unspecified 05/15/2016   PFIZER(Purple Top)SARS-COV-2 Vaccination 09/27/2019, 10/18/2019, 05/23/2020   Pneumococcal Conjugate-13 05/20/2013, 05/15/2016   Pneumococcal Polysaccharide-23 07/03/2001, 05/02/2011, 05/20/2018   Td 05/15/2004   Tdap 02/17/2014   Zoster Recombinat (Shingrix) 06/26/2020, 09/20/2020   Zoster, Live 07/16/2011    TDAP status: Up to date  Flu Vaccine status: Up to date  Pneumococcal vaccine status: Up to date  Covid-19 vaccine status: Completed vaccines  Qualifies for Shingles Vaccine? No   Zostavax completed Yes   Shingrix Completed?: Yes  Screening Tests Health Maintenance  Topic Date Due   URINE MICROALBUMIN  Never done   COVID-19 Vaccine (4 - Booster for Pfizer series) 08/22/2020   COLONOSCOPY (Pts 45-20yrs Insurance coverage will need to be confirmed)  11/19/2020   INFLUENZA VACCINE  04/02/2021   TETANUS/TDAP  02/18/2024   Hepatitis C Screening  Completed   PNA vac Low Risk Adult  Completed   Zoster Vaccines- Shingrix  Completed   HPV VACCINES  Aged Out    Health Maintenance  Health Maintenance Due  Topic Date Due   URINE MICROALBUMIN  Never done   COVID-19 Vaccine (4 - Booster for [place] series) 08/22/2020   COLONOSCOPY (Pts 45-55yrs Insurance coverage will need to be confirmed)  11/19/2020    Colorectal cancer screening: Due-Patient states he is in contact with GI & will schedule  an appt with them.  Lung Cancer Screening: (Low Dose CT Chest recommended if Age 54-80 years, 30 pack-year currently smoking OR have quit w/in 15years.) does not qualify.     Additional Screening:  Hepatitis C Screening:  Completed 07/26/2015  Vision Screening: Recommended annual ophthalmology exams for early detection of glaucoma and other disorders of the eye. Is the patient up to date with their annual eye exam?  Yes  Who is the provider or what is the name of the office in which the patient attends [facility] exams[facility]   Dental Screening: Recommended annual dental exams for proper oral hygiene  Community Resource Referral / Chronic Care Management: CRR required this visit?  No   CCM required this visit?  No      Plan:     I have personally reviewed and noted the following in the patient's chart:   Medical and social history Use of alcohol, tobacco or illicit drugs  Current medications and supplements including opioid prescriptions. Patient is not currently taking opioid prescriptions. Functional ability and status Nutritional status Physical activity Advanced directives List of other physicians Hospitalizations, surgeries, and ER visits in previous 12 months Vitals Screenings to include cognitive, depression, and falls Referrals and appointments  In addition, I have reviewed and discussed with patient certain preventive protocols, quality metrics, and best practice recommendations. A written personalized care plan for preventive services as well as general preventive health recommendations were provided to patient.   Due to this being a telephonic visit, the after visit summary with patients personalized plan was offered to patient via mail or my-chart. Patient would like to access on my-chart.    Marta Antu, LPN   1/61/0960  Nurse Health Advisor  Nurse Notes: None

## 2021-02-28 ENCOUNTER — Ambulatory Visit (INDEPENDENT_AMBULATORY_CARE_PROVIDER_SITE_OTHER): Payer: Medicare HMO

## 2021-02-28 VITALS — Ht 67.0 in | Wt 146.0 lb

## 2021-02-28 DIAGNOSIS — Z0000 Encounter for general adult medical examination without abnormal findings: Secondary | ICD-10-CM | POA: Diagnosis not present

## 2021-02-28 NOTE — Patient Instructions (Signed)
Mr. [name] , Thank you for taking time to complete your Medicare Wellness Visit. I appreciate your ongoing commitment to your health goals. Please review the following plan we discussed and let me know if I can assist you in the future.   Screening recommendations/referrals: Colonoscopy: Due-Per our conversation, you will call GI to schedule. Recommended yearly ophthalmology/optometry visit for glaucoma screening and checkup Recommended yearly dental visit for hygiene and checkup  Vaccinations: Influenza vaccine: Up to date Pneumococcal vaccine: Up to date Tdap vaccine: Up to date-Due-02/18/2024 Shingles vaccine: Completed vaccines   Covid-19: Up to date  Advanced directives: Copy in chart  Conditions/risks identified: See problem list  Next appointment: Follow up in one year for your annual wellness visit. 03/06/2022 @ 8:20  Preventive Care 74 Years and Older, Male Preventive care refers to lifestyle choices and visits with your health care provider that can promote health and wellness. What does preventive care include? A yearly physical exam. This is also called an annual well check. Dental exams once or twice a year. Routine eye exams. Ask your health care provider how often you should have your eyes checked. Personal lifestyle choices, including: Daily care of your teeth and gums. Regular physical activity. Eating a healthy diet. Avoiding tobacco and drug use. Limiting alcohol use. Practicing safe sex. Taking low doses of aspirin every day. Taking vitamin and mineral supplements as recommended by your health care provider. What happens during an annual well check? The services and screenings done by your health care provider during your annual well check will depend on your age, overall health, lifestyle risk factors, and family history of disease. Counseling  Your health care provider may ask you questions about your: Alcohol use. Tobacco use. Drug use. Emotional  well-being. Home and relationship well-being. Sexual activity. Eating habits. History of falls. Memory and ability to understand (cognition). Work and work Statistician. Screening  You may have the following tests or measurements: Height, weight, and BMI. Blood pressure. Lipid and cholesterol levels. These may be checked every 5 years, or more frequently if you are over 17 years old. Skin check. Lung cancer screening. You may have this screening every year starting at age 74 if you have a 30-pack-year history of smoking and currently smoke or have quit within the past 15 years. Fecal occult blood test (FOBT) of the stool. You may have this test every year starting at age 74. Flexible sigmoidoscopy or colonoscopy. You may have a sigmoidoscopy every 5 years or a colonoscopy every 10 years starting at age 74. Prostate cancer screening. Recommendations will vary depending on your family history and other risks. Hepatitis C blood test. Hepatitis B blood test. Sexually transmitted disease (STD) testing. Diabetes screening. This is done by checking your blood sugar (glucose) after you have not eaten for a while (fasting). You may have this done every 1-3 years. Abdominal aortic aneurysm (AAA) screening. You may need this if you are a current or former smoker. Osteoporosis. You may be screened starting at age 74 if you are at high risk. Talk with your health care provider about your test results, treatment options, and if necessary, the need for more tests. Vaccines  Your health care provider may recommend certain vaccines, such as: Influenza vaccine. This is recommended every year. Tetanus, diphtheria, and acellular pertussis (Tdap, Td) vaccine. You may need a Td booster every 10 years. Zoster vaccine. You may need this after age 74. Pneumococcal 13-valent conjugate (PCV13) vaccine. One dose is recommended after age 5. Pneumococcal  polysaccharide (PPSV23) vaccine. One dose is recommended after  age 40. Talk to your health care provider about which screenings and vaccines you need and how often you need them. This information is not intended to replace advice given to you by your health care provider. Make sure you discuss any questions you have with your health care provider. Document Released: 09/15/2015 Document Revised: 05/08/2016 Document Reviewed: 06/20/2015 Elsevier Interactive Patient Education  2017 [place] Prevention in the Home Falls can cause injuries. They can happen to people of all ages. There are many things you can do to make your home safe and to help prevent falls. What can I do on the outside of my home? Regularly fix the edges of walkways and driveways and fix any cracks. Remove anything that might make you trip as you walk through a door, such as a raised step or threshold. Trim any bushes or trees on the path to your home. Use bright outdoor lighting. Clear any walking paths of anything that might make someone trip, such as rocks or tools. Regularly check to see if handrails are loose or broken. Make sure that both sides of any steps have handrails. Any raised decks and porches should have guardrails on the edges. Have any leaves, snow, or ice cleared regularly. Use sand or salt on walking paths during winter. Clean up any spills in your garage right away. This includes oil or grease spills. What can I do in the bathroom? Use night lights. Install grab bars by the toilet and in the tub and shower. Do not use towel bars as grab bars. Use non-skid mats or decals in the tub or shower. If you need to sit down in the shower, use a plastic, non-slip stool. Keep the floor dry. Clean up any water that spills on the floor as soon as it happens. Remove soap buildup in the tub or shower regularly. Attach bath mats securely with double-sided non-slip rug tape. Do not have throw rugs and other things on the floor that can make you trip. What can I do in the  bedroom? Use night lights. Make sure that you have a light by your bed that is easy to reach. Do not use any sheets or blankets that are too big for your bed. They should not hang down onto the floor. Have a firm chair that has side arms. You can use this for support while you get dressed. Do not have throw rugs and other things on the floor that can make you trip. What can I do in the kitchen? Clean up any spills right away. Avoid walking on wet floors. Keep items that you use a lot in easy-to-reach places. If you need to reach something above you, use a strong step stool that has a grab bar. Keep electrical cords out of the way. Do not use floor polish or wax that makes floors slippery. If you must use wax, use non-skid floor wax. Do not have throw rugs and other things on the floor that can make you trip. What can I do with my stairs? Do not leave any items on the stairs. Make sure that there are handrails on both sides of the stairs and use them. Fix handrails that are broken or loose. Make sure that handrails are as long as the stairways. Check any carpeting to make sure that it is firmly attached to the stairs. Fix any carpet that is loose or worn. Avoid having throw rugs at the top  or bottom of the stairs. If you do have throw rugs, attach them to the floor with carpet tape. Make sure that you have a light switch at the top of the stairs and the bottom of the stairs. If you do not have them, ask someone to add them for you. What else can I do to help prevent falls? Wear shoes that: Do not have high heels. Have rubber bottoms. Are comfortable and fit you well. Are closed at the toe. Do not wear sandals. If you use a stepladder: Make sure that it is fully opened. Do not climb a closed stepladder. Make sure that both sides of the stepladder are locked into place. Ask someone to hold it for you, if possible. Clearly mark and make sure that you can see: Any grab bars or  handrails. First and last steps. Where the edge of each step is. Use tools that help you move around (mobility aids) if they are needed. These include: Canes. Walkers. Scooters. Crutches. Turn on the lights when you go into a dark area. Replace any light bulbs as soon as they burn out. Set up your furniture so you have a clear path. Avoid moving your furniture around. If any of your floors are uneven, fix them. If there are any pets around you, be aware of where they are. Review your medicines with your doctor. Some medicines can make you feel dizzy. This can increase your chance of falling. Ask your doctor what other things that you can do to help prevent falls. This information is not intended to replace advice given to you by your health care provider. Make sure you discuss any questions you have with your health care provider. Document Released: 06/15/2009 Document Revised: 01/25/2016 Document Reviewed: 09/23/2014 Elsevier Interactive Patient Education  2017 [employer].

## 2021-03-08 DIAGNOSIS — D1801 Hemangioma of skin and subcutaneous tissue: Secondary | ICD-10-CM | POA: Diagnosis not present

## 2021-03-08 DIAGNOSIS — D225 Melanocytic nevi of trunk: Secondary | ICD-10-CM | POA: Diagnosis not present

## 2021-03-08 DIAGNOSIS — L218 Other seborrheic dermatitis: Secondary | ICD-10-CM | POA: Diagnosis not present

## 2021-03-08 DIAGNOSIS — L720 Epidermal cyst: Secondary | ICD-10-CM | POA: Diagnosis not present

## 2021-03-08 DIAGNOSIS — X32XXXS Exposure to sunlight, sequela: Secondary | ICD-10-CM | POA: Diagnosis not present

## 2021-03-08 DIAGNOSIS — L821 Other seborrheic keratosis: Secondary | ICD-10-CM | POA: Diagnosis not present

## 2021-03-08 DIAGNOSIS — L814 Other melanin hyperpigmentation: Secondary | ICD-10-CM | POA: Diagnosis not present

## 2021-03-20 ENCOUNTER — Other Ambulatory Visit: Payer: Medicare HMO

## 2021-03-20 ENCOUNTER — Ambulatory Visit: Payer: Medicare HMO | Admitting: Thoracic Surgery (Cardiothoracic Vascular Surgery)

## 2021-03-26 ENCOUNTER — Ambulatory Visit: Payer: Medicare HMO | Admitting: Thoracic Surgery (Cardiothoracic Vascular Surgery)

## 2021-04-08 ENCOUNTER — Other Ambulatory Visit: Payer: Self-pay | Admitting: Family Medicine

## 2021-04-30 ENCOUNTER — Encounter: Payer: Self-pay | Admitting: Gastroenterology

## 2021-05-01 ENCOUNTER — Ambulatory Visit
Admission: RE | Admit: 2021-05-01 | Discharge: 2021-05-01 | Disposition: A | Discharge status: Home / Self Care | Payer: Medicare HMO | Source: Ambulatory Visit | Attending: Thoracic Surgery (Cardiothoracic Vascular Surgery) | Admitting: Thoracic Surgery (Cardiothoracic Vascular Surgery)

## 2021-05-01 ENCOUNTER — Ambulatory Visit: Payer: Medicare HMO | Admitting: Thoracic Surgery (Cardiothoracic Vascular Surgery)

## 2021-05-01 ENCOUNTER — Other Ambulatory Visit: Payer: Self-pay

## 2021-05-01 VITALS — BP 165/85 | HR 80 | Resp 20 | Ht 67.0 in | Wt 145.0 lb

## 2021-05-01 DIAGNOSIS — J984 Other disorders of lung: Secondary | ICD-10-CM

## 2021-05-01 DIAGNOSIS — R042 Hemoptysis: Secondary | ICD-10-CM

## 2021-05-01 DIAGNOSIS — R911 Solitary pulmonary nodule: Secondary | ICD-10-CM | POA: Diagnosis not present

## 2021-05-01 MED ORDER — BENZONATATE 100 MG PO CAPS: 100.0000 mg | ORAL_CAPSULE | Freq: Three times a day (TID) | ORAL | 0 refills | Status: DC | PRN

## 2021-05-01 NOTE — Progress Notes (Signed)
[address]       ,[place] [address]             [phone]      HPI: Mr. [name] returns for scheduled follow-up visit  [name] is a 75 year old man with a past history of right upper and middle bilobectomy for bronchiectasis in 1996, tobacco abuse, COPD, hypertension, reflux, arthritis, and hemoptysis.  He originally presented with hemoptysis in July 2021.  Treatment with a Z-Pak had no effect.  CT of the chest showed a cystic area in the superior aspect of the right chest.  It was unclear if this was pleural-based or in the superior segment of the lower lobe.  On 6-week follow-up there was a slight enlargement.  Navigational bronchoscopy was performed.  There was severe airway distortion.  Bronchoalveolar lavage grew staph and Acinetobacter.  He was treated with Z-Pak and Augmentin but did not improve.  His hemoptysis did improve with a 2-week course of Cipro in February 2022.  He is still had some hemoptysis so we gave him a 4-week course of Cipro.  After that his hemoptysis resolved completely.    In the interim since his last visit in May he has continued to have a small amount of hemoptysis about once a month.  He has not had any in the last couple of weeks.  For the past 10 days he and his wife have had a viral illness that started with respiratory symptoms and then became more GI based.  He does still have a cough.  Past Medical History:  Diagnosis Date   Allergic rhinitis    Atypical chest pain 12/01/2014   Back pain 01/12/2013   COPD (chronic obstructive pulmonary disease) (HCC)    FeV1 64%-2007   Dyspnea    Emphysema    GERD (gastroesophageal reflux disease)    History of lung abscess    bronchiectasis with RMLandRLL ersection -1996- Dr Arlyce Dice   Hordeolum externum (stye) 04/23/2015   Right eye   Hypertension    Impaired vision    glasses   Osteoarthritis    [name] and knees   Sinusitis, acute 04/23/2015    Current Outpatient Medications   Medication Sig Dispense Refill   acetaminophen (TYLENOL) 650 MG CR tablet Take 1,300 mg by mouth every 8 (eight) hours as needed for pain.     albuterol (VENTOLIN HFA) 108 (90 Base) MCG/ACT inhaler INHALE 2 PUFFS INTO THE LUNGS EVERY 6 (SIX) HOURS AS NEEDED FOR WHEEZING OR SHORTNESS OF BREATH. 36 g 2   amLODipine (NORVASC) 5 MG tablet TAKE 1 TABLET EVERY DAY 90 tablet 1   benzonatate (TESSALON PERLES) 100 MG capsule Take 1 capsule (100 mg total) by mouth 3 (three) times daily as needed for cough. 20 capsule 0   calcium elemental as carbonate (BARIATRIC TUMS ULTRA) 400 MG chewable tablet Chew 1,000 mg by mouth 2 (two) times daily.      Capsaicin-Menthol (SALONPAS GEL EX) Apply 1 application topically daily as needed (arthritis pain).     diclofenac Sodium (VOLTAREN) 1 % GEL Apply 2 g topically 3 (three) times daily as needed (pain).     fluticasone (FLONASE) 50 MCG/ACT nasal spray Place 2 sprays into both nostrils daily. 16 g 5   Glucosamine-Chondroitin 750-600 MG TABS Take 1 tablet by mouth 2 (two) times daily.     Guaifenesin 1200 MG TB12 Take 600 mg by mouth 2 (two) times daily.     imiquimod (  ALDARA) 5 % cream Apply topically 3 (three) times a week. 12 each 1   loratadine (CLARITIN) 10 MG tablet Take 10 mg by mouth daily.     meloxicam (MOBIC) 15 MG tablet TAKE 1 TABLET EVERY DAY AS NEEDED FOR PAIN 90 tablet 1   Menthol (ROBITUSSIN COUGH DROPS MT) Use as directed 1 lozenge in the mouth or throat 4 (four) times daily as needed (cough).      Menthol, Topical Analgesic, (BLUE-EMU MAXIMUM STRENGTH EX) Apply 1 application topically daily as needed (arthritis pain).      montelukast (SINGULAIR) 10 MG tablet TAKE 1 TABLET EVERY DAY 90 tablet 3   Multiple Vitamin (MULTIVITAMIN WITH MINERALS) TABS tablet Take 1 tablet by mouth daily.      TRELEGY ELLIPTA 100-62.5-25 MCG/INH AEPB INHALE 1 PUFF INTO THE LUNGS DAILY 180 each 1   No current facility-administered medications for this visit.    Physical  Exam BP (!) 165/85   Pulse 80   Resp 20   Ht _0  (1.702 m)   Wt 145 lb (65.8 kg)   SpO2 93% Comment: RA  BMI 22.35 kg/m  75 year old man in no acute distress Alert and oriented x3 with no focal deficits Hoarse voice Lungs with wheezing bilaterally  Diagnostic Tests: CT CHEST WITHOUT CONTRAST   TECHNIQUE: Multidetector CT imaging of the chest was performed following the standard protocol without IV contrast.   COMPARISON:  10/18/2020   FINDINGS: Cardiovascular: Coronary artery calcification and aortic atherosclerotic calcification.   Mediastinum/Nodes: No axillary or supraclavicular adenopathy. No mediastinal or hilar adenopathy. No pericardial fluid. Esophagus normal.   Lungs/Pleura: There is a rim of pleural thickening in the lateral superior aspect of the RIGHT lung. There is a cavitary component of the pleural thickening measuring 2.3 cm unchanged from prior (axial image 43/5 and coronal image 84/3). Postsurgical change consistent RIGHT upper lobe and RIGHT middle lobectomy. Scarring at the RIGHT lung base. No suspicious nodularity.   LEFT lung is clear without nodularity   Upper Abdomen: Limited view of the liver, kidneys, pancreas are unremarkable. Normal adrenal glands.   Musculoskeletal: Posterior lumbar decompression and fusion. Anterior cervical fusion with anterolisthesis of C7 on T1. No change from prior.   IMPRESSION: 1. Stable rim of pleural thickening in the RIGHT upper lobe with cavitary component. 2. No new or suspicious pulmonary nodularity in the RIGHT lung. 3. Status post RIGHT upper lobe and  RIGHT middle lobectomy.     Electronically Signed   By: Suzy Bouchard M.D.   On: 05/01/2021 10:35 I personally reviewed the CT images.  In comparison to his films from February I think there is been a slight decrease in size overall.  The rim of the lesion is of similar size.  Impression: [name] is a 75 year old man with a past history  of right upper and middle bilobectomy for bronchiectasis in 1996, tobacco abuse, COPD, hypertension, reflux, arthritis, and hemoptysis.  Cavitary lesion right apex-most likely infection related and in the superior segment of the lower lobe.  Cannot completely rule out the possibility of a contained BPF.  In any event has decreased slightly in size although still has some more wall thickness.  We will repeat a CT scan in 6 months.  Hemoptysis-still has occasional specks of blood once or twice a month.  Would not treat with antibiotics unless this were to worsen.  Viral illness-requesting cough medication.  Given prescription for Tessalon 100 mg p.o. 3 times daily as needed for  7 days.  He understands this is not an antibiotic.  Plan: Return in 6 months with CT chest  [name], MD [facility] [phone]

## 2021-06-15 ENCOUNTER — Telehealth: Payer: Self-pay | Admitting: *Deleted

## 2021-06-15 NOTE — Telephone Encounter (Signed)
[name]  Please review anesthesia note- pt having bronch - please advise  [name]  Signed                                      Procedure Name: Intubation Date/Time: 07/12/2020 1:09 PM Performed by: Colin Benton, CRNA Pre-anesthesia Checklist: Patient identified, Emergency Drugs available, Suction available and Patient being monitored Patient Re-evaluated:Patient Re-evaluated prior to induction Oxygen Delivery Method: Circle system utilized Preoxygenation: Pre-oxygenation with 100% oxygen Induction Type: IV induction Ventilation: Mask ventilation without difficulty and Oral airway inserted - appropriate to patient size Laryngoscope Size: Glidescope and 4 Grade View: Grade I Tube type: Oral Laser Tube: Laser Tube and Cuffed inflated with minimal occlusive pressure - saline Tube size: 8.0 mm Number of attempts: 1 Airway Equipment and Method: Oral airway,  Rigid stylet and Video-laryngoscopy Placement Confirmation: ETT inserted through vocal cords under direct vision,  positive ETCO2 and breath sounds checked- equal and bilateral Secured at: 23 cm Tube secured with: Tape Dental Injury: Teeth and Oropharynx as per pre-operative assessment

## 2021-06-15 NOTE — Telephone Encounter (Signed)
Noted- thanks  marie PV

## 2021-07-03 ENCOUNTER — Encounter: Payer: Self-pay | Admitting: Gastroenterology

## 2021-07-03 ENCOUNTER — Ambulatory Visit (AMBULATORY_SURGERY_CENTER): Payer: Medicare HMO | Admitting: *Deleted

## 2021-07-03 ENCOUNTER — Other Ambulatory Visit: Payer: Self-pay

## 2021-07-03 VITALS — Ht 67.0 in | Wt 148.0 lb

## 2021-07-03 DIAGNOSIS — Z1211 Encounter for screening for malignant neoplasm of colon: Secondary | ICD-10-CM

## 2021-07-03 MED ORDER — NA SULFATE-K SULFATE-MG SULF 17.5-3.13-1.6 GM/177ML PO SOLN: 1.0000 | ORAL | 0 refills | Status: DC

## 2021-07-03 NOTE — Progress Notes (Signed)

## 2021-07-12 ENCOUNTER — Other Ambulatory Visit: Payer: Self-pay | Admitting: Family Medicine

## 2021-07-15 ENCOUNTER — Encounter: Payer: Self-pay | Admitting: Certified Registered Nurse Anesthetist

## 2021-07-16 ENCOUNTER — Encounter: Payer: Self-pay | Admitting: Gastroenterology

## 2021-07-16 ENCOUNTER — Other Ambulatory Visit: Payer: Self-pay

## 2021-07-16 ENCOUNTER — Ambulatory Visit (AMBULATORY_SURGERY_CENTER): Payer: Medicare HMO | Admitting: Gastroenterology

## 2021-07-16 VITALS — BP 123/81 | HR 78 | Temp 97.5°F | Resp 14 | Ht 67.0 in | Wt 148.0 lb

## 2021-07-16 DIAGNOSIS — K621 Rectal polyp: Secondary | ICD-10-CM | POA: Diagnosis not present

## 2021-07-16 DIAGNOSIS — Z1211 Encounter for screening for malignant neoplasm of colon: Secondary | ICD-10-CM | POA: Diagnosis not present

## 2021-07-16 DIAGNOSIS — I10 Essential (primary) hypertension: Secondary | ICD-10-CM | POA: Diagnosis not present

## 2021-07-16 DIAGNOSIS — J449 Chronic obstructive pulmonary disease, unspecified: Secondary | ICD-10-CM | POA: Diagnosis not present

## 2021-07-16 DIAGNOSIS — D128 Benign neoplasm of rectum: Secondary | ICD-10-CM | POA: Diagnosis not present

## 2021-07-16 MED ORDER — SODIUM CHLORIDE 0.9 % IV SOLN: 500.0000 mL | Freq: Once | INTRAVENOUS | Status: DC

## 2021-07-16 NOTE — Progress Notes (Signed)
Report given to PACU, vss 

## 2021-07-16 NOTE — Progress Notes (Signed)
Called to room to assist during endoscopic procedure.  Patient ID and intended procedure confirmed with present staff. Received instructions for my participation in the procedure from the performing physician.  

## 2021-07-16 NOTE — Progress Notes (Signed)
Pt's states no medical or surgical changes since previsit or office visit. 

## 2021-07-16 NOTE — Progress Notes (Signed)
History & Physical  Primary Care Physician:  Mosie Lukes, MD Primary Gastroenterologist: Lucio Edward, MD  CHIEF COMPLAINT:  CRC screening  HPI: [name] is a 75 y.o. male for CRC screening, average risk, for colonoscopy.  Prior colonoscopy in 2012.   Past Medical History:  Diagnosis Date   Allergic rhinitis    Atypical chest pain 12/01/2014   Back pain 01/12/2013   COPD (chronic obstructive pulmonary disease) (HCC)    FeV1 64%-2007   Dyspnea    Emphysema    GERD (gastroesophageal reflux disease)    History of lung abscess    bronchiectasis with RMLandRLL ersection -1996- Dr Arlyce Dice   Hordeolum externum (stye) 04/23/2015   Right eye   Hypertension    Impaired vision    glasses   Osteoarthritis    hands and knees   Sinusitis, acute 04/23/2015    Past Surgical History:  Procedure Laterality Date   COLONOSCOPY  11/20/2010   Dr.Cora Brierley   HERNIA REPAIR  10/03/2009   left knee     lower back surgery  09/02/2008   06/2009   LUNG SURGERY     RML andRUL removed due to bleeding and bronchiectasis and lung abcess   NECK SURGERY  05/03/2008   right foot surgery     VIDEO BRONCHOSCOPY N/A 07/12/2020   Procedure: VIDEO BRONCHOSCOPY;  Surgeon: [name], MD;  Location: [place];  Service: Thoracic;  Laterality: N/A;   VIDEO BRONCHOSCOPY WITH ENDOBRONCHIAL NAVIGATION N/A 07/12/2020   Procedure: VIDEO BRONCHOSCOPY WITH ENDOBRONCHIAL NAVIGATION;  Surgeon: [name], MD;  Location: [place];  Service: Thoracic;  Laterality: N/A;    Prior to Admission medications   Medication Sig Start Date End Date Taking? Authorizing Provider  acetaminophen (TYLENOL) 650 MG CR tablet Take 1,300 mg by mouth every 8 (eight) hours as needed for pain.   Yes [provider]  albuterol (VENTOLIN HFA) 108 (90 Base) MCG/ACT inhaler INHALE 2 PUFFS INTO THE LUNGS EVERY 6 (SIX) HOURS AS NEEDED FOR WHEEZING OR SHORTNESS OF BREATH. 03/22/20  Yes Mosie Lukes, MD   amLODipine (NORVASC) 5 MG tablet TAKE 1 TABLET EVERY DAY 04/09/21  Yes Mosie Lukes, MD  calcium elemental as carbonate (BARIATRIC TUMS ULTRA) 400 MG chewable tablet Chew 1,000 mg by mouth 2 (two) times daily.    Yes [provider]  Capsaicin-Menthol (SALONPAS GEL EX) Apply 1 application topically daily as needed (arthritis pain).   Yes [provider]  cetirizine (ZYRTEC) 10 MG tablet Take 10 mg by mouth daily.   Yes [provider]  diclofenac Sodium (VOLTAREN) 1 % GEL Apply 2 g topically 3 (three) times daily as needed (pain).   Yes [provider]  fluticasone (FLONASE) 50 MCG/ACT nasal spray Place 2 sprays into both nostrils daily. 04/09/21  Yes Mosie Lukes, MD  Glucosamine-Chondroitin 750-600 MG TABS Take 1 tablet by mouth 2 (two) times daily.   Yes [provider]  Guaifenesin 1200 MG TB12 Take 600 mg by mouth 2 (two) times daily.   Yes [provider]  imiquimod (ALDARA) 5 % cream Apply topically 3 (three) times a week. 12/24/19  Yes Mosie Lukes, MD  loratadine (CLARITIN) 10 MG tablet Take 10 mg by mouth daily.   Yes [provider]  meloxicam (MOBIC) 15 MG tablet TAKE 1 TABLET EVERY DAY AS NEEDED FOR PAIN 11/21/20  Yes Mosie Lukes, MD  Menthol, Topical Analgesic, (BLUE-EMU MAXIMUM STRENGTH EX) Apply 1  application topically daily as needed (arthritis pain).    Yes [provider]  montelukast (SINGULAIR) 10 MG tablet TAKE 1 TABLET EVERY DAY 10/09/20  Yes Mosie Lukes, MD  Multiple Vitamin (MULTIVITAMIN WITH MINERALS) TABS tablet Take 1 tablet by mouth daily.    Yes [provider]  Donnal Debar 100-62.5-25 MCG/ACT AEPB INHALE 1 PUFF INTO THE LUNGS DAILY 07/12/21  Yes Mosie Lukes, MD  benzonatate (TESSALON PERLES) 100 MG capsule Take 1 capsule (100 mg total) by mouth 3 (three) times daily as needed for cough. Patient not taking: Reported on 07/16/2021 05/01/21   [name], MD   Menthol [facility] COUGH DROPS MT) Use as directed 1 lozenge in the mouth or throat 4 (four) times daily as needed (cough).     [provider]    Current Outpatient Medications  Medication Sig Dispense Refill   acetaminophen (TYLENOL) 650 MG CR tablet Take 1,300 mg by mouth every 8 (eight) hours as needed for pain.     albuterol (VENTOLIN HFA) 108 (90 Base) MCG/ACT inhaler INHALE 2 PUFFS INTO THE LUNGS EVERY 6 (SIX) HOURS AS NEEDED FOR WHEEZING OR SHORTNESS OF BREATH. 36 g 2   amLODipine (NORVASC) 5 MG tablet TAKE 1 TABLET EVERY DAY 90 tablet 1   calcium elemental as carbonate (BARIATRIC TUMS ULTRA) 400 MG chewable tablet Chew 1,000 mg by mouth 2 (two) times daily.      Capsaicin-Menthol (SALONPAS GEL EX) Apply 1 application topically daily as needed (arthritis pain).     cetirizine (ZYRTEC) 10 MG tablet Take 10 mg by mouth daily.     diclofenac Sodium (VOLTAREN) 1 % GEL Apply 2 g topically 3 (three) times daily as needed (pain).     fluticasone (FLONASE) 50 MCG/ACT nasal spray Place 2 sprays into both nostrils daily. 16 g 5   Glucosamine-Chondroitin 750-600 MG TABS Take 1 tablet by mouth 2 (two) times daily.     Guaifenesin 1200 MG TB12 Take 600 mg by mouth 2 (two) times daily.     imiquimod (ALDARA) 5 % cream Apply topically 3 (three) times a week. 12 each 1   loratadine (CLARITIN) 10 MG tablet Take 10 mg by mouth daily.     meloxicam (MOBIC) 15 MG tablet TAKE 1 TABLET EVERY DAY AS NEEDED FOR PAIN 90 tablet 1   Menthol, Topical Analgesic, (BLUE-EMU MAXIMUM STRENGTH EX) Apply 1 application topically daily as needed (arthritis pain).      montelukast (SINGULAIR) 10 MG tablet TAKE 1 TABLET EVERY DAY 90 tablet 3   Multiple Vitamin (MULTIVITAMIN WITH MINERALS) TABS tablet Take 1 tablet by mouth daily.      TRELEGY ELLIPTA 100-62.5-25 MCG/ACT AEPB INHALE 1 PUFF INTO THE LUNGS DAILY 180 each 1   benzonatate (TESSALON PERLES) 100 MG capsule Take 1 capsule (100 mg total) by mouth 3  (three) times daily as needed for cough. (Patient not taking: Reported on 07/16/2021) 20 capsule 0   Menthol (ROBITUSSIN COUGH DROPS MT) Use as directed 1 lozenge in the mouth or throat 4 (four) times daily as needed (cough).      Current Facility-Administered Medications  Medication Dose Route Frequency Provider Last Rate Last Admin   0.9 %  sodium chloride infusion  500 mL Intravenous Once Ladene Artist, MD        Allergies as of 07/16/2021 - Review Complete 07/16/2021  Allergen Reaction Noted   Losartan Other (See Comments) 03/13/2020    Family History  Problem Relation Age  of Onset   Heart failure Mother        age 87   Prostate cancer Father        father died prostate ca   Obstructive Sleep Apnea Brother    Obesity Brother    Colon cancer Paternal Grandmother    Coronary artery disease Other        1st degree relative<60   Stroke Other        1st degree relative<50   Esophageal cancer Neg Hx    Stomach cancer Neg Hx     Social History   Socioeconomic History   Marital status: Married    Spouse name: Not on file   Number of children: Not on file   Years of education: Not on file   Highest education level: Not on file  Occupational History   Occupation: retired Music therapist  Tobacco Use   Smoking status: Former    Packs/day: 1.50    Years: 38.00    Pack years: 57.00    Types: Cigarettes    Quit date: 09/03/2000    Years since quitting: 20.8   Smokeless tobacco: Never  Vaping Use   Vaping Use: Never used  Substance and Sexual Activity   Alcohol use: Yes    Alcohol/week: 2.0 - 3.0 standard drinks    Types: 2 - 3 Cans of beer per week   Drug use: No   Sexual activity: Never  Other Topics Concern   Not on file  Social History Narrative   Retired Music therapist   Patient states former smoker. 1 1/2 ppd x 38 yrs  Quit in Jan 2002   Married - 2 weeks (4th marriage)   divorced,  remarried 84-2004 (lost wife to lung ca),  remarried (divorced),     1 son  - 42 [name])   Alcohol use-yes (2-3 beers per week)           Social Determinants of Health   Financial Resource Strain: Low Risk    Difficulty of Paying Living Expenses: Not very hard  Food Insecurity: No Food Insecurity   Worried About Charity fundraiser in the Last Year: Never true   Ran Out of Food in the Last Year: Never true  Transportation Needs: No Transportation Needs   Lack of Transportation (Medical): No   Lack of Transportation (Non-Medical): No  Physical Activity: Sufficiently Active   Days of Exercise per Week: 6 days   Minutes of Exercise per Session: 30 min  Stress: No Stress Concern Present   Feeling of Stress : Not at all  Social Connections: Moderately Integrated   Frequency of Communication with Friends and Family: More than three times a week   Frequency of Social Gatherings with Friends and Family: More than three times a week   Attends Religious Services: More than 4 times per year   Active Member of [employer] or Organizations: No   Attends Archivist Meetings: Never   Marital Status: Married  Human resources officer Violence: Not At Risk   Fear of Current or Ex-Partner: No   Emotionally Abused: No   Physically Abused: No   Sexually Abused: No    Review of Systems:  All systems reviewed an negative except where noted in HPI.  Gen: Denies any fever, chills, sweats, anorexia, fatigue, weakness, malaise, weight loss, and sleep disorder CV: Denies chest pain, angina, palpitations, syncope, orthopnea, PND, peripheral edema, and claudication. Resp: Denies dyspnea at rest, dyspnea with exercise, cough,  sputum, wheezing, coughing up blood, and pleurisy. GI: Denies vomiting blood, jaundice, and fecal incontinence.   Denies dysphagia or odynophagia. GU : Denies urinary burning, blood in urine, urinary frequency, urinary hesitancy, nocturnal urination, and urinary incontinence. MS: Denies joint pain, limitation of movement, and swelling, stiffness,  low back pain, extremity pain. Denies muscle weakness, cramps, atrophy.  Derm: Denies rash, itching, dry skin, hives, moles, warts, or unhealing ulcers.  Psych: Denies depression, anxiety, memory loss, suicidal ideation, hallucinations, paranoia, and confusion. Heme: Denies bruising, bleeding, and enlarged lymph nodes. Neuro:  Denies any headaches, dizziness, paresthesias. Endo:  Denies any problems with DM, thyroid, adrenal function.   Physical Exam: General:  Alert, well-developed, in NAD Head:  Normocephalic and atraumatic. Eyes:  Sclera clear, no icterus.   Conjunctiva pink. Ears:  Normal auditory acuity. Mouth:  No deformity or lesions.  Neck:  Supple; no masses . Lungs:  Clear throughout to auscultation.   No wheezes, crackles, or rhonchi. No acute distress. Heart:  Regular rate and rhythm; no murmurs. Abdomen:  Soft, nondistended, nontender. No masses, hepatomegaly. No obvious masses.  Normal bowel .    Rectal:  Deferred   Msk:  Symmetrical without gross deformities.. Pulses:  Normal pulses noted. Extremities:  Without edema. Neurologic:  Alert and  oriented x4;  grossly normal neurologically. Skin:  Intact without significant lesions or rashes. Cervical Nodes:  No significant cervical adenopathy. Psych:  Alert and cooperative. Normal mood and affect.   Impression / Plan:   CRC screening, average risk.  Prior colonoscopy in 2012.    Pricilla Riffle. Fuller Plan  07/16/2021, 11:32 AM See Shea Evans, [facility], to contact our on call provider

## 2021-07-16 NOTE — Op Note (Signed)
[name] Procedure Date: 07/16/2021 11:29 AM MRN: [id number] Endoscopist: Ladene Artist , MD Age: 75 Referring MD:  Date of Birth: [birth year] Gender: Male Account #: [id number] Procedure:                Colonoscopy Indications:              Screening for colorectal malignant neoplasm Medicines:                Monitored Anesthesia Care Procedure:                Pre-Anesthesia Assessment:                           - Prior to the procedure, a History and Physical                            was performed, and patient medications and                            allergies were reviewed. The patient's tolerance of                            previous anesthesia was also reviewed. The risks                            and benefits of the procedure and the sedation                            options and risks were discussed with the patient.                            All questions were answered, and informed consent                            was obtained. Prior Anticoagulants: The patient has                            taken no previous anticoagulant or antiplatelet                            agents. ASA Grade Assessment: III - A patient with                            severe systemic disease. After reviewing the risks                            and benefits, the patient was deemed in                            satisfactory condition to undergo the procedure.                           After obtaining informed consent, the colonoscope  was passed under direct vision. Throughout the                            procedure, the patient's blood pressure, pulse, and                            oxygen saturations were monitored continuously. The                            Olympus CF-HQ190L [id number]) Colonoscope was                            introduced through the anus and advanced to the the                            cecum,  identified by appendiceal orifice and                            ileocecal valve. The ileocecal valve, appendiceal                            orifice, and rectum were photographed. The quality                            of the bowel preparation was good. The colonoscopy                            was performed without difficulty. The patient                            tolerated the procedure well. Scope In: 11:37:08 AM Scope Out: 11:52:36 AM Scope Withdrawal Time: 0 hours 12 minutes 29 seconds  Total Procedure Duration: 0 hours 15 minutes 28 seconds  Findings:                 The perianal and digital rectal examinations were                            normal.                           A few small localized angiodysplastic lesions                            without bleeding were found in the ascending colon.                           Two sessile polyps were found in the proximal                            rectum. The polyps were 5 to 6 mm in size. These                            polyps were removed with a cold snare. Resection  and retrieval were complete.                           A few small-mouthed diverticula were found in the                            sigmoid colon.                           Internal hemorrhoids were found during                            retroflexion. The hemorrhoids were small and Grade                            I (internal hemorrhoids that do not prolapse).                           The exam was otherwise without abnormality on                            direct and retroflexion views. Complications:            No immediate complications. Estimated blood loss:                            None. Estimated Blood Loss:     Estimated blood loss: none. Impression:               - Two 5 to 6 mm polyps in the proximal rectum,                            removed with a cold snare. Resected and retrieved.                           - A few small  AVMs in the ascending colon.                           - Mild diverticulosis in the sigmoid colon.                           - Internal hemorrhoids.                           - The examination was otherwise normal on direct                            and retroflexion views. Recommendation:           - Repeat colonoscopy vs no repeat due to age after                            studies are complete for surveillance based on                            pathology results.                           -  Patient has a contact number available for                            emergencies. The signs and symptoms of potential                            delayed complications were discussed with the                            patient. Return to normal activities tomorrow.                            Written discharge instructions were provided to the                            patient.                           - Resume previous diet.                           - Continue present medications.                           - Await pathology results. Ladene Artist, MD 07/16/2021 11:57:27 AM This report has been signed electronically.

## 2021-07-16 NOTE — Patient Instructions (Signed)
Impression/Recommendations:  Polyp, diverticulosis, and hemorrhoid handouts given to patient.  Resume previous diet. Continue present medications. Await pathology results.  Repeat colonoscopy to be determined based on pathology results.  YOU HAD AN ENDOSCOPIC PROCEDURE TODAY AT [facility]:   Refer to the procedure report that was given to you for any specific questions about what was found during the examination.  If the procedure report does not answer your questions, please call your gastroenterologist to clarify.  If you requested that your care partner not be given the details of your procedure findings, then the procedure report has been included in a sealed envelope for you to review at your convenience later.  YOU SHOULD EXPECT: Some feelings of bloating in the abdomen. Passage of more gas than usual.  Walking can help get rid of the air that was put into your GI tract during the procedure and reduce the bloating. If you had a lower endoscopy (such as a colonoscopy or flexible sigmoidoscopy) you may notice spotting of blood in your stool or on the toilet paper. If you underwent a bowel prep for your procedure, you may not have a normal bowel movement for a few days.  Please Note:  You might notice some irritation and congestion in your nose or some drainage.  This is from the oxygen used during your procedure.  There is no need for concern and it should clear up in a day or so.  SYMPTOMS TO REPORT IMMEDIATELY:  Following lower endoscopy (colonoscopy or flexible sigmoidoscopy):  Excessive amounts of blood in the stool  Significant tenderness or worsening of abdominal pains  Swelling of the abdomen that is new, acute  Fever of 100F or higher  For urgent or emergent issues, a gastroenterologist can be reached at any hour by calling [phone]. Do not use MyChart messaging for urgent concerns.    DIET:  We do recommend a small meal at first, but then you may  proceed to your regular diet.  Drink plenty of fluids but you should avoid alcoholic beverages for 24 hours.  ACTIVITY:  You should plan to take it easy for the rest of today and you should NOT DRIVE or use heavy machinery until tomorrow (because of the sedation medicines used during the test).    FOLLOW UP: Our staff will call the number listed on your records 48-72 hours following your procedure to check on you and address any questions or concerns that you may have regarding the information given to you following your procedure. If we do not reach you, we will leave a message.  We will attempt to reach you two times.  During this call, we will ask if you have developed any symptoms of COVID 19. If you develop any symptoms (ie: fever, flu-like symptoms, shortness of breath, cough etc.) before then, please call [phone].  If you test positive for Covid 19 in the 2 weeks post procedure, please call and report this information to [place].    If any biopsies were taken you will be contacted by phone or by letter within the next 1-3 weeks.  Please call us at [phone] if you have not heard about the biopsies in 3 weeks.    SIGNATURES/CONFIDENTIALITY: You and/or your care partner have signed paperwork which will be entered into your electronic medical record.  These signatures attest to the fact that that the information above on your After Visit Summary has been reviewed and is understood.  Full responsibility of the  confidentiality of this discharge information lies with you and/or your care-partner.

## 2021-07-18 ENCOUNTER — Telehealth: Payer: Self-pay

## 2021-07-18 NOTE — Telephone Encounter (Signed)
Called [phone] and left a message we tried to reach pt for a follow up call. maw

## 2021-07-18 NOTE — Telephone Encounter (Signed)
  Follow up Call-  Call back number 07/16/2021  Post procedure Call Back phone  # [phone]  Permission to leave phone message Yes  Some recent data might be hidden     Patient questions:  Do you have a fever, pain , or abdominal swelling? No. Pain Score  0 *  Have you tolerated food without any problems? Yes.    Have you been able to return to your normal activities? Yes.    Do you have any questions about your discharge instructions: Diet   No. Medications  No. Follow up visit  No.  Do you have questions or concerns about your Care? No.  Actions: * If pain score is 4 or above: No action needed, pain <4. Have you developed a fever since your procedure? no  2.   Have you had an respiratory symptoms (SOB or cough) since your procedure? no  3.   Have you tested positive for COVID 19 since your procedure no  4.   Have you had any family members/close contacts diagnosed with the COVID 19 since your procedure?  no   If yes to any of these questions please route to Joylene John, RN and Joella Prince, RN

## 2021-07-31 ENCOUNTER — Encounter: Payer: Self-pay | Admitting: Gastroenterology

## 2021-08-16 ENCOUNTER — Other Ambulatory Visit: Payer: Self-pay | Admitting: Family Medicine

## 2021-08-16 ENCOUNTER — Encounter: Payer: Self-pay | Admitting: Family Medicine

## 2021-08-16 ENCOUNTER — Ambulatory Visit (INDEPENDENT_AMBULATORY_CARE_PROVIDER_SITE_OTHER): Payer: Medicare HMO | Admitting: Family Medicine

## 2021-08-16 VITALS — BP 124/60 | HR 47 | Temp 98.0°F | Resp 16 | Ht 67.0 in | Wt 148.2 lb

## 2021-08-16 DIAGNOSIS — M25511 Pain in right shoulder: Secondary | ICD-10-CM

## 2021-08-16 DIAGNOSIS — Z0000 Encounter for general adult medical examination without abnormal findings: Secondary | ICD-10-CM

## 2021-08-16 DIAGNOSIS — I10 Essential (primary) hypertension: Secondary | ICD-10-CM | POA: Diagnosis not present

## 2021-08-16 DIAGNOSIS — M25561 Pain in right knee: Secondary | ICD-10-CM | POA: Diagnosis not present

## 2021-08-16 DIAGNOSIS — Z125 Encounter for screening for malignant neoplasm of prostate: Secondary | ICD-10-CM

## 2021-08-16 DIAGNOSIS — G8929 Other chronic pain: Secondary | ICD-10-CM

## 2021-08-16 DIAGNOSIS — R739 Hyperglycemia, unspecified: Secondary | ICD-10-CM | POA: Diagnosis not present

## 2021-08-16 DIAGNOSIS — M25551 Pain in right hip: Secondary | ICD-10-CM | POA: Diagnosis not present

## 2021-08-16 DIAGNOSIS — M25552 Pain in left hip: Secondary | ICD-10-CM | POA: Diagnosis not present

## 2021-08-16 DIAGNOSIS — M25512 Pain in left shoulder: Secondary | ICD-10-CM

## 2021-08-16 DIAGNOSIS — M25562 Pain in left knee: Secondary | ICD-10-CM | POA: Diagnosis not present

## 2021-08-16 DIAGNOSIS — E785 Hyperlipidemia, unspecified: Secondary | ICD-10-CM | POA: Diagnosis not present

## 2021-08-16 DIAGNOSIS — R042 Hemoptysis: Secondary | ICD-10-CM

## 2021-08-16 LAB — LIPID PANEL
Cholesterol: 150 mg/dL (ref 0–200)
HDL: 55.8 mg/dL (ref >39.00)
LDL Cholesterol: 67 mg/dL (ref 0–99)
NonHDL: 94.18
Total CHOL/HDL Ratio: 3
Triglycerides: 137 mg/dL (ref 0.0–149.0)
VLDL: 27.4 mg/dL (ref 0.0–40.0)

## 2021-08-16 LAB — COMPREHENSIVE METABOLIC PANEL
ALT: 17 U/L (ref 0–53)
AST: 24 U/L (ref 0–37)
Albumin: 4 g/dL (ref 3.5–5.2)
Alkaline Phosphatase: 73 U/L (ref 39–117)
BUN: 29 mg/dL — ABNORMAL HIGH (ref 6–23)
CO2: 27 mEq/L (ref 19–32)
Calcium: 9.5 mg/dL (ref 8.4–10.5)
Chloride: 104 mEq/L (ref 96–112)
Creatinine, Ser: 0.85 mg/dL (ref 0.40–1.50)
GFR: 85.44 mL/min (ref >60.00)
Glucose, Bld: 79 mg/dL (ref 70–99)
Potassium: 4.5 mEq/L (ref 3.5–5.1)
Sodium: 138 mEq/L (ref 135–145)
Total Bilirubin: 0.7 mg/dL (ref 0.2–1.2)
Total Protein: 7 g/dL (ref 6.0–8.3)

## 2021-08-16 LAB — PSA: PSA: 2.22 ng/mL (ref 0.10–4.00)

## 2021-08-16 LAB — CBC WITH DIFFERENTIAL/PLATELET
Basophils Absolute: 0 10*3/uL (ref 0.0–0.1)
Basophils Relative: 0.8 % (ref 0.0–3.0)
Eosinophils Absolute: 0.1 10*3/uL (ref 0.0–0.7)
Eosinophils Relative: 1.4 % (ref 0.0–5.0)
HCT: 41.8 % (ref 39.0–52.0)
Hemoglobin: 13.7 g/dL (ref 13.0–17.0)
Lymphocytes Relative: 24.5 % (ref 12.0–46.0)
Lymphs Abs: 1.3 10*3/uL (ref 0.7–4.0)
MCHC: 32.7 g/dL (ref 30.0–36.0)
MCV: 95.8 fl (ref 78.0–100.0)
Monocytes Absolute: 0.4 10*3/uL (ref 0.1–1.0)
Monocytes Relative: 7.2 % (ref 3.0–12.0)
Neutro Abs: 3.6 10*3/uL (ref 1.4–7.7)
Neutrophils Relative %: 66.1 % (ref 43.0–77.0)
Platelets: 243 10*3/uL (ref 150.0–400.0)
RBC: 4.36 Mil/uL (ref 4.22–5.81)
RDW: 13.9 % (ref 11.5–15.5)
WBC: 5.5 10*3/uL (ref 4.0–10.5)

## 2021-08-16 LAB — HIGH SENSITIVITY CRP: CRP, High Sensitivity: 9.07 mg/L — ABNORMAL HIGH (ref 0.000–5.000)

## 2021-08-16 LAB — SEDIMENTATION RATE: Sed Rate: 34 mm/hr — ABNORMAL HIGH (ref 0–20)

## 2021-08-16 LAB — HEMOGLOBIN A1C: Hgb A1c MFr Bld: 5.7 % (ref 4.6–6.5)

## 2021-08-16 LAB — TSH: TSH: 0.57 u[IU]/mL (ref 0.35–5.50)

## 2021-08-16 MED ORDER — PREDNISONE 20 MG PO TABS: 20.0000 mg | ORAL_TABLET | Freq: Every day | ORAL | 1 refills | Status: DC

## 2021-08-16 NOTE — Patient Instructions (Signed)
Preventive Care 65 Years and Older, Male °Preventive care refers to lifestyle choices and visits with your health care provider that can promote health and wellness. Preventive care visits are also called wellness exams. °What can I expect for my preventive care visit? °Counseling °During your preventive care visit, your health care provider may ask about your: °Medical history, including: °Past medical problems. °Family medical history. °History of falls. °Current health, including: °Emotional well-being. °Home life and relationship well-being. °Sexual activity. °Memory and ability to understand (cognition). °Lifestyle, including: °Alcohol, nicotine or tobacco, and drug use. °Access to firearms. °Diet, exercise, and sleep habits. °Work and work environment. °Sunscreen use. °Safety issues such as seatbelt and bike helmet use. °Physical exam °Your health care provider will check your: °Height and weight. These may be used to calculate your BMI (body mass index). BMI is a measurement that tells if you are at a healthy weight. °Waist circumference. This measures the distance around your waistline. This measurement also tells if you are at a healthy weight and may help predict your risk of certain diseases, such as type 2 diabetes and high blood pressure. °Heart rate and blood pressure. °Body temperature. °Skin for abnormal spots. °What immunizations do I need? °Vaccines are usually given at various ages, according to a schedule. Your health care provider will recommend vaccines for you based on your age, medical history, and lifestyle or other factors, such as travel or where you work. °What tests do I need? °Screening °Your health care provider may recommend screening tests for certain conditions. This may include: °Lipid and cholesterol levels. °Diabetes screening. This is done by checking your blood sugar (glucose) after you have not eaten for a while (fasting). °Hepatitis C test. °Hepatitis B test. °HIV (human  immunodeficiency virus) test. °STI (sexually transmitted infection) testing, if you are at risk. °Lung cancer screening. °Colorectal cancer screening. °Prostate cancer screening. °Abdominal aortic aneurysm (AAA) screening. You may need this if you are a current or former smoker. °Talk with your health care provider about your test results, treatment options, and if necessary, the need for more tests. °Follow these instructions at home: °Eating and drinking ° °Eat a diet that includes fresh fruits and vegetables, whole grains, lean protein, and low-fat dairy products. Limit your intake of foods with high amounts of sugar, saturated fats, and salt. °Take vitamin and mineral supplements as recommended by your health care provider. °Do not drink alcohol if your health care provider tells you not to drink. °If you drink alcohol: °Limit how much you have to 0-2 drinks a day. °Know how much alcohol is in your drink. In the [place] one drink equals one 12 oz bottle of beer (355 mL), one 5 oz glass of wine (148 mL), or one 1½ oz glass of hard liquor (44 mL). °Lifestyle °Brush your teeth every morning and night with fluoride toothpaste. Floss one time each day. °Exercise for at least 30 minutes 5 or more days each week. °Do not use any products that contain nicotine or tobacco. These products include cigarettes, chewing tobacco, and vaping devices, such as e-cigarettes. If you need help quitting, ask your health care provider. °Do not use drugs. °If you are sexually active, practice safe sex. Use a condom or other form of protection to prevent STIs. °Take aspirin only as told by your health care provider. Make sure that you understand how much to take and what form to take. Work with your health care provider to find out whether it is safe and   beneficial for you to take aspirin daily. °Ask your health care provider if you need to take a cholesterol-lowering medicine (statin). °Find healthy ways to manage stress, such  as: °Meditation, yoga, or listening to music. °Journaling. °Talking to a trusted person. °Spending time with friends and family. °Safety °Always wear your seat belt while driving or riding in a vehicle. °Do not drive: °If you have been drinking alcohol. Do not ride with someone who has been drinking. °When you are tired or distracted. °While texting. °If you have been using any mind-altering substances or drugs. °Wear a helmet and other protective equipment during sports activities. °If you have firearms in your house, make sure you follow all gun safety procedures. °Minimize exposure to UV radiation to reduce your risk of skin cancer. °What's next? °Visit your health care provider once a year for an annual wellness visit. °Ask your health care provider how often you should have your eyes and teeth checked. °Stay up to date on all vaccines. °This information is not intended to replace advice given to you by your health care provider. Make sure you discuss any questions you have with your health care provider. °Document Revised: 02/14/2021 Document Reviewed: 02/14/2021 °Elsevier Patient Education © 2022 [employer] ° °

## 2021-08-16 NOTE — Assessment & Plan Note (Signed)
Has not had any episodes in months since seeing Dr Roxan Hockey and being treated with an antibiotic

## 2021-08-16 NOTE — Progress Notes (Signed)
Patient ID: [name], male    DOB: 02/03/1946  Age: 75 y.o. MRN: [id number]    Subjective:   Chief Complaint  Patient presents with   Annual Exam   Subjective  HPI [name] presents for office visit today for comprehensive physical exam today and follow up on management of chronic concerns. He has no recent ER visits or febrile illnesses to report. Denies CP/palp/SOB/HA/congestion/fevers/GI or GU c/o. Taking meds as prescribed.  Arthritis: He reports that it is worsening in most of his joints. He is still experiencing pain despite using tylenol and topical creams. The pain is causing him limited ROM in shoulders bilaterally. He has FMHx of arthritis in both paternal grandparents. He states that at the moment his right knee is swollen. He states that he had surgery in 2009. He endorses doing yard work for physical activity.   Review of Systems  Constitutional:  Negative for chills, fatigue and fever.  HENT:  Negative for congestion, rhinorrhea, sinus pressure, sinus pain, sore throat and trouble swallowing.   Eyes:  Negative for pain.  Respiratory:  Negative for cough and shortness of breath.   Cardiovascular:  Negative for chest pain, palpitations and leg swelling.  Gastrointestinal:  Negative for abdominal pain, blood in stool, diarrhea, nausea and vomiting.  Genitourinary:  Negative for flank pain, frequency and penile pain.  Musculoskeletal:  Positive for arthralgias and joint swelling. Negative for back pain.  Neurological:  Negative for headaches.   History Past Medical History:  Diagnosis Date   Allergic rhinitis    Atypical chest pain 12/01/2014   Back pain 01/12/2013   COPD (chronic obstructive pulmonary disease) (HCC)    FeV1 64%-2007   Dyspnea    Emphysema    GERD (gastroesophageal reflux disease)    History of lung abscess    bronchiectasis with RMLandRLL ersection -1996- Dr Arlyce Dice   Hordeolum externum (stye) 04/23/2015   Right eye   Hypertension     Impaired vision    glasses   Osteoarthritis    hands and knees   Sinusitis, acute 04/23/2015    He has a past surgical history that includes Lung surgery; Neck surgery (05/03/2008); lower back surgery (09/02/2008); Hernia repair (10/03/2009); left knee; right foot surgery; Video bronchoscopy (N/A, 07/12/2020); Video bronchoscopy with endobronchial navigation (N/A, 07/12/2020); and Colonoscopy (11/20/2010).   His family history includes Colon cancer in his paternal grandmother; Coronary artery disease in an other family member; Heart failure in his mother; Obesity in his brother; Obstructive Sleep Apnea in his brother; Prostate cancer in his father; Stroke in an other family member.He reports that he quit smoking about 20 years ago. His smoking use included cigarettes. He has a 57.00 pack-year smoking history. He has never used smokeless tobacco. He reports current alcohol use of about 2.0 - 3.0 standard drinks per week. He reports that he does not use drugs.  Current Outpatient Medications on File Prior to Visit  Medication Sig Dispense Refill   acetaminophen (TYLENOL) 650 MG CR tablet Take 1,300 mg by mouth every 8 (eight) hours as needed for pain.     albuterol (VENTOLIN HFA) 108 (90 Base) MCG/ACT inhaler INHALE 2 PUFFS INTO THE LUNGS EVERY 6 (SIX) HOURS AS NEEDED FOR WHEEZING OR SHORTNESS OF BREATH. 36 g 2   amLODipine (NORVASC) 5 MG tablet TAKE 1 TABLET EVERY DAY 90 tablet 1   calcium elemental as carbonate (BARIATRIC TUMS ULTRA) 400 MG chewable tablet Chew 1,000 mg by mouth 2 (two) times daily.  Capsaicin-Menthol (SALONPAS GEL EX) Apply 1 application topically daily as needed (arthritis pain).     cetirizine (ZYRTEC) 10 MG tablet Take 10 mg by mouth daily.     diclofenac Sodium (VOLTAREN) 1 % GEL Apply 2 g topically 3 (three) times daily as needed (pain).     fluticasone (FLONASE) 50 MCG/ACT nasal spray Place 2 sprays into both nostrils daily. 16 g 5   Glucosamine-Chondroitin 750-600 MG  TABS Take 1 tablet by mouth 2 (two) times daily.     Guaifenesin 1200 MG TB12 Take 600 mg by mouth 2 (two) times daily.     imiquimod (ALDARA) 5 % cream Apply topically 3 (three) times a week. 12 each 1   loratadine (CLARITIN) 10 MG tablet Take 10 mg by mouth daily.     meloxicam (MOBIC) 15 MG tablet TAKE 1 TABLET EVERY DAY AS NEEDED FOR PAIN 90 tablet 1   Menthol (ROBITUSSIN COUGH DROPS MT) Use as directed 1 lozenge in the mouth or throat 4 (four) times daily as needed (cough).      Menthol, Topical Analgesic, (BLUE-EMU MAXIMUM STRENGTH EX) Apply 1 application topically daily as needed (arthritis pain).      montelukast (SINGULAIR) 10 MG tablet TAKE 1 TABLET EVERY DAY 90 tablet 3   Multiple Vitamin (MULTIVITAMIN WITH MINERALS) TABS tablet Take 1 tablet by mouth daily.      TRELEGY ELLIPTA 100-62.5-25 MCG/ACT AEPB INHALE 1 PUFF INTO THE LUNGS DAILY 180 each 1   No current facility-administered medications on file prior to visit.     Objective:  Objective  Physical Exam Constitutional:      General: He is not in acute distress.    Appearance: Normal appearance. He is not ill-appearing or toxic-appearing.  HENT:     Head: Normocephalic and atraumatic.     Right Ear: Tympanic membrane, ear canal and external ear normal.     Left Ear: Tympanic membrane, ear canal and external ear normal.     Nose: No congestion or rhinorrhea.  Eyes:     Extraocular Movements: Extraocular movements intact.     Pupils: Pupils are equal, round, and reactive to light.  Cardiovascular:     Rate and Rhythm: Normal rate and regular rhythm.     Pulses: Normal pulses.          Posterior tibial pulses are 2+ on the right side and 2+ on the left side.     Heart sounds: Normal heart sounds. No murmur heard. Pulmonary:     Effort: Pulmonary effort is normal. No respiratory distress.     Breath sounds: Normal breath sounds. No wheezing, rhonchi or rales.  Abdominal:     General: Bowel sounds are normal.      Palpations: Abdomen is soft. There is no mass.     Tenderness: There is no abdominal tenderness. There is no guarding.     Hernia: No hernia is present.  Musculoskeletal:        General: Normal range of motion.     Cervical back: Normal range of motion and neck supple.  Skin:    General: Skin is warm and dry.  Neurological:     Mental Status: He is alert and oriented to person, place, and time.     Cranial Nerves: No facial asymmetry.     Motor: Motor function is intact. No weakness.     Deep Tendon Reflexes:     Reflex Scores:      Patellar reflexes are 2+ on  the right side and 2+ on the left side. Psychiatric:        Behavior: Behavior normal.   BP 124/60    Pulse (!) 47    Temp 98 F (36.7 C)    Resp 16    Ht 5\' 7"  (1.702 m)    Wt 148 lb 3.2 oz (67.2 kg)    SpO2 93%    BMI 23.21 kg/m  Wt Readings from Last 3 Encounters:  08/16/21 148 lb 3.2 oz (67.2 kg)  07/16/21 148 lb (67.1 kg)  07/03/21 148 lb (67.1 kg)     Lab Results  Component Value Date   WBC 4.4 12/26/2020   HGB 13.7 12/26/2020   HCT 41.4 12/26/2020   PLT 247.0 12/26/2020   GLUCOSE 80 12/26/2020   CHOL 150 12/26/2020   TRIG 176.0 (H) 12/26/2020   HDL 48.20 12/26/2020   LDLDIRECT 85 07/22/2014   LDLCALC 67 12/26/2020   ALT 16 12/26/2020   AST 25 12/26/2020   NA 138 12/26/2020   K 4.6 12/26/2020   CL 104 12/26/2020   CREATININE 0.81 12/26/2020   BUN 22 12/26/2020   CO2 27 12/26/2020   TSH 0.57 12/26/2020   PSA 2.55 12/23/2019   INR 1.1 07/10/2020   HGBA1C 5.7 12/26/2020    CT CHEST WO CONTRAST  Result Date: 05/01/2021 CLINICAL DATA:  Cavitary lesion.  Lung nodule EXAM: CT CHEST WITHOUT CONTRAST TECHNIQUE: Multidetector CT imaging of the chest was performed following the standard protocol without IV contrast. COMPARISON:  10/18/2020 FINDINGS: Cardiovascular: Coronary artery calcification and aortic atherosclerotic calcification. Mediastinum/Nodes: No axillary or supraclavicular adenopathy. No  mediastinal or hilar adenopathy. No pericardial fluid. Esophagus normal. Lungs/Pleura: There is a rim of pleural thickening in the lateral superior aspect of the RIGHT lung. There is a cavitary component of the pleural thickening measuring 2.3 cm unchanged from prior (axial image 43/5 and coronal image 84/3). Postsurgical change consistent RIGHT upper lobe and RIGHT middle lobectomy. Scarring at the RIGHT lung base. No suspicious nodularity. LEFT lung is clear without nodularity Upper Abdomen: Limited view of the liver, kidneys, pancreas are unremarkable. Normal adrenal glands. Musculoskeletal: Posterior lumbar decompression and fusion. Anterior cervical fusion with anterolisthesis of C7 on T1. No change from prior. IMPRESSION: 1. Stable rim of pleural thickening in the RIGHT upper lobe with cavitary component. 2. No new or suspicious pulmonary nodularity in the RIGHT lung. 3. Status post RIGHT upper lobe and  RIGHT middle lobectomy. Electronically Signed   By: Suzy Bouchard M.D.   On: 05/01/2021 10:35     Assessment & Plan:  Plan    No orders of the defined types were placed in this encounter.   Problem List Items Addressed This Visit     Benign essential HTN    Well controlled, no changes to meds. Encouraged heart healthy diet such as the DASH diet and exercise as tolerated.       Relevant Orders   CBC with Differential/Platelet   Comprehensive metabolic panel   TSH   Lipid panel   Annual physical exam    Patient encouraged to maintain heart healthy diet, regular exercise, adequate sleep. Consider daily probiotics. Take medications as prescribed. Had colonoscopy last month and has aged out unless he becomes symptomatic for repeat. Labs ordered and reviewed      Chronic arthralgias of knees and hips    Check RF and sed and crp but suspect osteoarthritis is the culprit. Encouraged to stay active and eat a low  inflammation diet. Let us know if he is ready for referral      Relevant  Orders   Rheumatoid Factor   Sedimentation rate   CRP High sensitivity   Bilateral shoulder pain    His pain with dysfunction is worse in his shoulders and he is offered a referral to sports med or ortho but declines for now, he is reminded to keep his shoulders moving to prevent frozen shoulder and to let us know if he is ready for referral for now continue Tylenol and topical rubs and stay active      Hyperglycemia - Primary    hgba1c acceptable, minimize simple carbs. Increase exercise as tolerated.       Relevant Orders   CBC with Differential/Platelet   Comprehensive metabolic panel   TSH   Lipid panel   HgB A1c   Hyperlipidemia    Encourage heart healthy diet such as MIND or DASH diet, increase exercise, avoid trans fats, simple carbohydrates and processed foods, consider a krill or fish or flaxseed oil cap daily.       Relevant Orders   CBC with Differential/Platelet   Comprehensive metabolic panel   TSH   Lipid panel   Hemoptysis    Has not had any episodes in months since seeing Dr Roxan Hockey and being treated with an antibiotic      Other Visit Diagnoses     Screening for prostate cancer       Relevant Orders   PSA       Follow-up: Return for f/u visit in 6 months (around 02/14/2022) then CPE in 1 year around (08/16/2022).  I, Suezanne Jacquet, acting as a scribe for Penni Homans, MD, have documented all relevent documentation on behalf of Penni Homans, MD, as directed by Penni Homans, MD while in the presence of Penni Homans, MD. DO:08/16/21.  I, Mosie Lukes, MD personally performed the services described in this documentation. All medical record entries made by the scribe were at my direction and in my presence. I have reviewed the chart and agree that the record reflects my personal performance and is accurate and complete

## 2021-08-16 NOTE — Assessment & Plan Note (Signed)
Check RF and sed and crp but suspect osteoarthritis is the culprit. Encouraged to stay active and eat a low inflammation diet. Let us know if he is ready for referral

## 2021-08-16 NOTE — Assessment & Plan Note (Signed)
Encourage heart healthy diet such as MIND or DASH diet, increase exercise, avoid trans fats, simple carbohydrates and processed foods, consider a krill or fish or flaxseed oil cap daily.  °

## 2021-08-16 NOTE — Assessment & Plan Note (Signed)
Patient encouraged to maintain heart healthy diet, regular exercise, adequate sleep. Consider daily probiotics. Take medications as prescribed. Had colonoscopy last month and has aged out unless he becomes symptomatic for repeat. Labs ordered and reviewed

## 2021-08-16 NOTE — Assessment & Plan Note (Signed)
His pain with dysfunction is worse in his shoulders and he is offered a referral to sports med or ortho but declines for now, he is reminded to keep his shoulders moving to prevent frozen shoulder and to let us know if he is ready for referral for now continue Tylenol and topical rubs and stay active

## 2021-08-16 NOTE — Assessment & Plan Note (Signed)
Well controlled, no changes to meds. Encouraged heart healthy diet such as the DASH diet and exercise as tolerated.  °

## 2021-08-16 NOTE — Assessment & Plan Note (Signed)
hgba1c acceptable, minimize simple carbs. Increase exercise as tolerated.  

## 2021-08-17 ENCOUNTER — Encounter: Payer: Self-pay | Admitting: Family Medicine

## 2021-08-17 LAB — RHEUMATOID FACTOR: Rheumatoid fact SerPl-aCnc: <14 IU/mL (ref <14)

## 2021-09-02 ENCOUNTER — Encounter: Payer: Self-pay | Admitting: Family Medicine

## 2021-09-02 DIAGNOSIS — Z96611 Presence of right artificial shoulder joint: Secondary | ICD-10-CM

## 2021-09-02 HISTORY — DX: Presence of right artificial shoulder joint: Z96.611

## 2021-09-03 ENCOUNTER — Other Ambulatory Visit: Payer: Self-pay | Admitting: Family Medicine

## 2021-09-04 ENCOUNTER — Other Ambulatory Visit: Payer: Self-pay | Admitting: Family Medicine

## 2021-09-04 DIAGNOSIS — M353 Polymyalgia rheumatica: Secondary | ICD-10-CM

## 2021-09-04 MED ORDER — BENZONATATE 100 MG PO CAPS: 100.0000 mg | ORAL_CAPSULE | Freq: Two times a day (BID) | ORAL | 1 refills | Status: DC | PRN

## 2021-09-06 ENCOUNTER — Other Ambulatory Visit: Payer: Self-pay

## 2021-09-06 ENCOUNTER — Ambulatory Visit (HOSPITAL_BASED_OUTPATIENT_CLINIC_OR_DEPARTMENT_OTHER)
Admission: RE | Admit: 2021-09-06 | Discharge: 2021-09-06 | Disposition: A | Discharge status: Home / Self Care | Payer: Medicare HMO | Source: Ambulatory Visit | Attending: Family Medicine | Admitting: Family Medicine

## 2021-09-06 ENCOUNTER — Ambulatory Visit (INDEPENDENT_AMBULATORY_CARE_PROVIDER_SITE_OTHER): Payer: Medicare HMO | Admitting: Family Medicine

## 2021-09-06 ENCOUNTER — Telehealth: Payer: Self-pay | Admitting: Interventional Cardiology

## 2021-09-06 ENCOUNTER — Encounter: Payer: Self-pay | Admitting: Family Medicine

## 2021-09-06 VITALS — BP 142/80 | HR 45 | Temp 98.8°F | Resp 18 | Ht 67.0 in | Wt 157.8 lb

## 2021-09-06 DIAGNOSIS — R053 Chronic cough: Secondary | ICD-10-CM

## 2021-09-06 DIAGNOSIS — I499 Cardiac arrhythmia, unspecified: Secondary | ICD-10-CM

## 2021-09-06 DIAGNOSIS — R300 Dysuria: Secondary | ICD-10-CM | POA: Diagnosis not present

## 2021-09-06 DIAGNOSIS — M5135 Other intervertebral disc degeneration, thoracolumbar region: Secondary | ICD-10-CM | POA: Diagnosis not present

## 2021-09-06 DIAGNOSIS — J441 Chronic obstructive pulmonary disease with (acute) exacerbation: Secondary | ICD-10-CM | POA: Diagnosis not present

## 2021-09-06 DIAGNOSIS — M546 Pain in thoracic spine: Secondary | ICD-10-CM | POA: Diagnosis not present

## 2021-09-06 DIAGNOSIS — Z9889 Other specified postprocedural states: Secondary | ICD-10-CM | POA: Diagnosis not present

## 2021-09-06 DIAGNOSIS — I498 Other specified cardiac arrhythmias: Secondary | ICD-10-CM

## 2021-09-06 DIAGNOSIS — R822 Biliuria: Secondary | ICD-10-CM | POA: Diagnosis not present

## 2021-09-06 DIAGNOSIS — M545 Low back pain, unspecified: Secondary | ICD-10-CM | POA: Diagnosis not present

## 2021-09-06 DIAGNOSIS — M5134 Other intervertebral disc degeneration, thoracic region: Secondary | ICD-10-CM | POA: Diagnosis not present

## 2021-09-06 DIAGNOSIS — Z981 Arthrodesis status: Secondary | ICD-10-CM | POA: Diagnosis not present

## 2021-09-06 MED ORDER — DOXYCYCLINE HYCLATE 100 MG PO CAPS: 100.0000 mg | ORAL_CAPSULE | Freq: Two times a day (BID) | ORAL | 0 refills | Status: DC

## 2021-09-06 MED ORDER — HYDROCODONE-ACETAMINOPHEN 5-325 MG PO TABS: 1.0000 | ORAL_TABLET | Freq: Three times a day (TID) | ORAL | 0 refills | Status: AC | PRN

## 2021-09-06 NOTE — Patient Instructions (Signed)
It was good to see you today-  I will be in touch with your x-rays asap  Start on the doxycycline for your lungs   Please let me know if you are not feeling better soon You do have "bigeminy" which is a benign cardiac arrhythmia.  However, if you start to have palpitations or any other symptoms please let me or Dr Charlett Blake know about it

## 2021-09-06 NOTE — Progress Notes (Signed)
[place] at [facility] [address], [place], [place] [address] [phone]  Date:  09/06/2021   Name:  [name]   DOB:  1946-01-02   MRN:  [id number]  PCP:  Mosie Lukes, MD    Chief Complaint: UC follow up (Pt was seen today at [place]. No scripts prescribed but suggested labs.)   History of Present Illness:  [name] is a 76 y.o. very pleasant male patient who presents with the following:  Pt with history of tobacco, copd, HTN, lung abscess in 1996 s/p right middle and upper lobs resection Pt of Dr Charlett Blake who is here today with concern of folowing up from UC He was seen at [facility] on 1/5- today- he was directed to the ER for concern of RUQ pain but he came here instead  Today pt notes his stomach may feel queasy but not painful No vomiting -exactly his main issue is actually back pain  Pt had a CPE with Dr B in December and notes that his OA had gotten a lot worse at that time He was given pred for general joint pains but he thinks this may have worsened his back pain- he stopped taking it after 7-8 days due to his back getting worse.  His other joints however did improve with prednisone He has been waking up with back pain several times a night since  He is using tylenol and OTC lidocaine patches but continues to have significant pain  He does have degenerative changes in his lower back-  He had a major lumbar and cervical fusion in 2010 He had a second lumbar surgery later the same year due to a fall from a horse   Covid and flu are UTD  Noted irregular pulse- he denies any CP or worse than normal SOB (baseline COPD and lung resection), he has not really noted palpitations   Patient Active Problem List   Diagnosis Date Noted   Hemoptysis 06/26/2020   Hyperlipidemia 12/23/2019   Recurrent sinusitis 09/28/2018   High risk medication use 09/28/2018   Solitary pulmonary nodule on lung  CT 07/06/2018   Arthritis of left knee 12/18/2017   Anemia 09/15/2017   Left knee pain 09/15/2017   Chronic arthralgias of knees and hips 09/15/2017   Bilateral shoulder pain 09/15/2017   Nocturia 09/15/2017   Hyperglycemia 09/15/2017   Skin lesion 09/15/2017   H/O tobacco use, presenting hazards to health 09/15/2017   Hx of migraines 09/28/2016   COPD exacerbation ([place]) 04/24/2015   Atypical chest pain 12/01/2014   Screening for ischemic heart disease 07/25/2014   Prostate cancer screening 07/25/2014   Preventative health care 07/25/2014   Asymptomatic PVCs 07/25/2014   Arthritis 10/18/2013   Incomplete RBBB 07/19/2013   Back pain 01/12/2013   Annual physical exam 07/21/2011   Carotid bruit 07/21/2011   Benign essential HTN 05/23/2010   Allergic rhinitis 07/07/2007   COPD GOLD II D 07/07/2007   GERD 07/07/2007    Past Medical History:  Diagnosis Date   Allergic rhinitis    Atypical chest pain 12/01/2014   Back pain 01/12/2013   COPD (chronic obstructive pulmonary disease) (HCC)    FeV1 64%-2007   Dyspnea    Emphysema    GERD (gastroesophageal reflux disease)    History of lung abscess    bronchiectasis with RMLandRLL ersection -1996- Dr Arlyce Dice   Hordeolum externum (stye) 04/23/2015  Right eye   Hypertension    Impaired vision    glasses   Osteoarthritis    hands and knees   Sinusitis, acute 04/23/2015    Past Surgical History:  Procedure Laterality Date   COLONOSCOPY  11/20/2010   Dr.Stark   HERNIA REPAIR  10/03/2009   left knee     lower back surgery  09/02/2008   06/2009   LUNG SURGERY     RML andRUL removed due to bleeding and bronchiectasis and lung abcess   NECK SURGERY  05/03/2008   right foot surgery     VIDEO BRONCHOSCOPY N/A 07/12/2020   Procedure: VIDEO BRONCHOSCOPY;  Surgeon: [name], MD;  Location: [place];  Service: Thoracic;  Laterality: N/A;   VIDEO BRONCHOSCOPY WITH ENDOBRONCHIAL NAVIGATION N/A 07/12/2020   Procedure: VIDEO  BRONCHOSCOPY WITH ENDOBRONCHIAL NAVIGATION;  Surgeon: [name], MD;  Location: [facility];  Service: Thoracic;  Laterality: N/A;    Social History   Tobacco Use   Smoking status: Former    Packs/day: 1.50    Years: 38.00    Pack years: 57.00    Types: Cigarettes    Quit date: 09/03/2000    Years since quitting: 21.0   Smokeless tobacco: Never  Vaping Use   Vaping Use: Never used  Substance Use Topics   Alcohol use: Yes    Alcohol/week: 2.0 - 3.0 standard drinks    Types: 2 - 3 Cans of beer per week   Drug use: No    Family History  Problem Relation Age of Onset   Heart failure Mother        age 55   Prostate cancer Father        father died prostate ca   Obstructive Sleep Apnea Brother    Obesity Brother    Colon cancer Paternal Grandmother    Coronary artery disease Other        1st degree relative<60   Stroke Other        1st degree relative<50   Esophageal cancer Neg Hx    Stomach cancer Neg Hx     Allergies  Allergen Reactions   Losartan Other (See Comments)    weakness    Medication list has been reviewed and updated.  Current Outpatient Medications on File Prior to Visit  Medication Sig Dispense Refill   acetaminophen (TYLENOL) 650 MG CR tablet Take 1,300 mg by mouth every 8 (eight) hours as needed for pain.     albuterol (VENTOLIN HFA) 108 (90 Base) MCG/ACT inhaler INHALE 2 PUFFS INTO THE LUNGS EVERY 6 HOURS AS NEEDED FOR WHEEZING OR SHORTNESS OF BREATH. 2 each 0   amLODipine (NORVASC) 5 MG tablet TAKE 1 TABLET EVERY DAY 90 tablet 1   benzonatate (TESSALON) 100 MG capsule Take 1 capsule (100 mg total) by mouth 2 (two) times daily as needed for cough. 40 capsule 1   calcium elemental as carbonate (BARIATRIC TUMS ULTRA) 400 MG chewable tablet Chew 1,000 mg by mouth 2 (two) times daily.      Capsaicin-Menthol (SALONPAS GEL EX) Apply 1 application topically daily as needed (arthritis pain).     cetirizine (ZYRTEC) 10 MG tablet Take 10 mg by mouth  daily.     diclofenac Sodium (VOLTAREN) 1 % GEL Apply 2 g topically 3 (three) times daily as needed (pain).     fluticasone (FLONASE) 50 MCG/ACT nasal spray Place 2 sprays into both nostrils daily. 16 g 5   Glucosamine-Chondroitin 750-600 MG TABS  Take 1 tablet by mouth 2 (two) times daily.     Guaifenesin 1200 MG TB12 Take 600 mg by mouth 2 (two) times daily.     imiquimod (ALDARA) 5 % cream Apply topically 3 (three) times a week. 12 each 1   loratadine (CLARITIN) 10 MG tablet Take 10 mg by mouth daily.     meloxicam (MOBIC) 15 MG tablet TAKE 1 TABLET EVERY DAY AS NEEDED FOR PAIN 90 tablet 1   Menthol (ROBITUSSIN COUGH DROPS MT) Use as directed 1 lozenge in the mouth or throat 4 (four) times daily as needed (cough).      Menthol, Topical Analgesic, (BLUE-EMU MAXIMUM STRENGTH EX) Apply 1 application topically daily as needed (arthritis pain).      montelukast (SINGULAIR) 10 MG tablet TAKE 1 TABLET EVERY DAY 90 tablet 3   Multiple Vitamin (MULTIVITAMIN WITH MINERALS) TABS tablet Take 1 tablet by mouth daily.      predniSONE (DELTASONE) 20 MG tablet Take 1 tablet (20 mg total) by mouth daily with breakfast. 30 tablet 1   TRELEGY ELLIPTA 100-62.5-25 MCG/ACT AEPB INHALE 1 PUFF INTO THE LUNGS DAILY 180 each 1   No current facility-administered medications on file prior to visit.    Review of Systems:  As per HPI- otherwise negative.   Physical Examination: Vitals:   09/06/21 1548  BP: (!) 142/80  Pulse: (!) 45  Resp: 18  Temp: 98.8 F (37.1 C)  SpO2: 97%   Vitals:   09/06/21 1548  Weight: 157 lb 12.8 oz (71.6 kg)  Height: 5\' 7"  (1.702 m)   Body mass index is 24.71 kg/m. Ideal Body Weight: Weight in (lb) to have BMI = 25: 159.3  GEN: no acute distress.  Wiry appearing older gentleman, looks well in no distress HEENT: Atraumatic, Normocephalic. Bilateral TM wnl, oropharynx normal.  PEERL,EOMI.   Ears and Nose: No external deformity. CV: Pulse sometimes regular, sometimes  irregular -no M/G/R. No JVD. No thrill. No extra heart sounds. PULM: CTA B, no wheezes, crackles, rhonchi. No retractions. No resp. distress. No accessory muscle use. ABD: S, NT, ND, +BS. No rebound. No HSM. EXTR: No c/c/e PSYCH: Normally interactive. Conversant. There is a large midline lumbar area scar due to multilevel lumbar fusion.  Patient has a lidocaine patch over his mid to lower thoracic spine.  When removed, skin is normal.  Not able to locate any point tenderness over the lumbar spine  EKG: bigeminy with rate approximate 100  Assessment and Plan: Irregularly irregular pulse rhythm - Plan: EKG 12-Lead  Chronic cough - Plan: DG Chest 2 View  Thoracic spine pain - Plan: DG Thoracic Spine 2 View, HYDROcodone-acetaminophen (NORCO/VICODIN) 5-325 MG tablet  COPD exacerbation (HCC) - Plan: doxycycline (VIBRAMYCIN) 100 MG capsule  Bigeminy  Patient seen today with concern of thoracic back pain.  He has a known history of significant degenerative spine disease, status post lumbar and cervical fusions.  We will obtain thoracic spine films today. Patient notes that his pain has been waking him up at night, he is not able to control with over-the-counter medications.  Prescribed hydrocodone to use as needed  He also notes a cough which is relatively chronic, but recently has been more productive.  We will obtain plain film of his chest and treat with a course of doxycycline for possible COPD exacerbation  Patient noted to be in bigeminy today-he has been seen by cardiology, Dr. Johnsie Cancel in 2021 due to frequent PVCs.  He had an echocardiogram which  was reassuring Is able to speak with on-call cardiologist at Dr. Kyla Balzarine office-since patient has no chest pain or other particular symptoms we will simply observe bigeminy for the time being.  Beta-blockade deferred as it may worsen his COPD symptoms  Signed Lamar Blinks, MD  Received x-ray reports as below, message to patient  DG Chest 2  View  Result Date: 09/06/2021 CLINICAL DATA:  Mid back pain.  Chronic cough. EXAM: CHEST - 2 VIEW COMPARISON:  Chest x-ray 07/10/2020.  CT chest 05/01/2021. FINDINGS: Postsurgical changes and volume loss in the right hemithorax. There is stable right apical pleural thickening and linear scarring in the right costophrenic angle and right lung base. Stable nodular area of scarring in the right lung apex. No new focal lung infiltrate. No pneumothorax. Left lung is clear. No acute fractures. There surgical changes in the cervical spine and lumbar spine. IMPRESSION: 1. No acute cardiopulmonary process. 2. Stable postsurgical changes and scarring in the right lung. Electronically Signed   By: Ronney Asters M.D.   On: 09/06/2021 17:23   DG Thoracic Spine 2 View  Result Date: 09/06/2021 CLINICAL DATA:  Pain in mid back. History of cervical and lumbar fusion. EXAM: THORACIC SPINE 2 VIEWS COMPARISON:  Remote thoracic spine MRI 06/21/2009 reviewed. Chest CT 05/01/2021 FINDINGS: Twelve thoracic vertebra. Normal alignment. Disc space narrowing and endplate spurring from Y6-R4 through T12-L1. similar wedging of T12 vertebra with grade 1 retrolisthesis of T12 on L1. Remaining vertebral body heights are normal. There is no evidence of fracture, focal bone lesion or bone destruction. Surgical hardware in the lower cervical and lumbar spine, partially included. Postsurgical change in the right hemithorax, assessed on chest radiograph earlier today. IMPRESSION: 1. Degenerative disc disease from T8-T9 through T12-L1. 2. Chronic wedging of T12 vertebra with grade 1 retrolisthesis of T12 on L1. Electronically Signed   By: Keith Rake M.D.   On: 09/06/2021 17:06

## 2021-09-06 NOTE — Telephone Encounter (Signed)
Dr. Edilia Bo calling to speak with DOD.

## 2021-09-07 NOTE — Telephone Encounter (Signed)
° °  DOD call for bigeminy.  Patient asymptomatic.  Could consider monitor to quantify PVCs.  No acute management needed.  Beta blocker may worsen COPD.  Discussed with Dr. Edilia Bo.   Jettie Booze, MD

## 2021-09-14 ENCOUNTER — Other Ambulatory Visit: Payer: Self-pay | Admitting: Thoracic Surgery (Cardiothoracic Vascular Surgery)

## 2021-09-14 DIAGNOSIS — R911 Solitary pulmonary nodule: Secondary | ICD-10-CM

## 2021-10-15 NOTE — Progress Notes (Signed)
Office Visit Note  Patient: [name]             Date of Birth: December 28, 1945           MRN: [id number]             PCP: Mosie Lukes, MD Referring: Mosie Lukes, MD Visit Date: 10/16/2021  Subjective:   History of Present Illness: [name] is a 76 y.o. male here for possible PMR. He has a history of chronic COPD and past middle and upper lobectomy for cavitary infection, PVCs, and some chronic OA. He developed increased joint pain and stiffness especially in his back, with elevated ESR and CRP. RF checked was negative. He was started on daily prednisone in December for this. He was seen for ventricular bigeminy otherwise reassuring EKG and no anginal symptoms, observed without new interventions. Now tapering prednisone down to 10 mg daily dose. His symptoms improved elsewhere by 70% or better but back pain remains back waking him multiple times per night. Bilateral shoulder pain also remains increased and bothering him especially with overhead range of motion. He continues to have stiffness lasting throughout the day.  Activities of Daily Living:  Patient reports morning stiffness for all day.  Patient Reports nocturnal pain.  Difficulty dressing/grooming: Reports Difficulty climbing stairs: Reports Difficulty getting out of chair: Reports Difficulty using hands for taps, buttons, cutlery, and/or writing: Reports  Review of Systems  Constitutional:  Positive for fatigue.  HENT:  Negative for mouth sores, mouth dryness and nose dryness.   Eyes:  Negative for pain, itching and dryness.  Respiratory:  Positive for shortness of breath and difficulty breathing.   Cardiovascular:  Negative for chest pain and palpitations.  Gastrointestinal:  Positive for constipation. Negative for blood in stool and diarrhea.  Endocrine: Negative for increased urination.  Genitourinary:  Negative for difficulty urinating.  Musculoskeletal:  Positive for joint pain, joint pain, joint  swelling, myalgias, morning stiffness, muscle tenderness and myalgias.  Skin:  Negative for color change, rash and redness.  Allergic/Immunologic: Negative for susceptible to infections.  Neurological:  Positive for weakness. Negative for dizziness, numbness, headaches and memory loss.  Hematological:  Negative for bruising/bleeding tendency.  Psychiatric/Behavioral:  Negative for confusion.    PMFS History:  Patient Active Problem List   Diagnosis Date Noted   Sedimentation rate elevation 10/16/2021   CRP elevated 10/16/2021   Hemoptysis 06/26/2020   Hyperlipidemia 12/23/2019   Recurrent sinusitis 09/28/2018   High risk medication use 09/28/2018   Solitary pulmonary nodule on lung CT 07/06/2018   Arthritis of left knee 12/18/2017   Anemia 09/15/2017   Left knee pain 09/15/2017   Chronic arthralgias of knees and hips 09/15/2017   Bilateral shoulder pain 09/15/2017   Nocturia 09/15/2017   Hyperglycemia 09/15/2017   Skin lesion 09/15/2017   H/O tobacco use, presenting hazards to health 09/15/2017   Hx of migraines 09/28/2016   COPD exacerbation ([place]) 04/24/2015   Atypical chest pain 12/01/2014   Screening for ischemic heart disease 07/25/2014   Prostate cancer screening 07/25/2014   Preventative health care 07/25/2014   Asymptomatic PVCs 07/25/2014   Arthritis 10/18/2013   Incomplete RBBB 07/19/2013   Back pain 01/12/2013   Annual physical exam 07/21/2011   Carotid bruit 07/21/2011   Benign essential HTN 05/23/2010   Allergic rhinitis 07/07/2007   COPD GOLD II D 07/07/2007   GERD 07/07/2007    Past Medical History:  Diagnosis Date   Allergic rhinitis  Atypical chest pain 12/01/2014   Back pain 01/12/2013   COPD (chronic obstructive pulmonary disease) (HCC)    FeV1 64%-2007   Dyspnea    Emphysema    GERD (gastroesophageal reflux disease)    History of lung abscess    bronchiectasis with RMLandRLL ersection -1996- Dr Arlyce Dice   Hordeolum externum (stye) 04/23/2015    Right eye   Hypertension    Impaired vision    glasses   Osteoarthritis    hands and knees   Sinusitis, acute 04/23/2015    Family History  Problem Relation Age of Onset   Heart failure Mother        age 49   Prostate cancer Father        father died prostate ca   Obstructive Sleep Apnea Brother    Obesity Brother    Colon cancer Paternal Grandmother    Healthy Son    Coronary artery disease Other        1st degree relative<60   Stroke Other        1st degree relative<50   Esophageal cancer Neg Hx    Stomach cancer Neg Hx    Past Surgical History:  Procedure Laterality Date   COLONOSCOPY  11/20/2010   Dr.Stark   HERNIA REPAIR  10/03/2009   left knee     lower back surgery  09/02/2008   06/2009   LUNG SURGERY     RML andRUL removed due to bleeding and bronchiectasis and lung abcess   NECK SURGERY  05/03/2008   right foot surgery     VIDEO BRONCHOSCOPY N/A 07/12/2020   Procedure: VIDEO BRONCHOSCOPY;  Surgeon: [name], MD;  Location: [place];  Service: Thoracic;  Laterality: N/A;   VIDEO BRONCHOSCOPY WITH ENDOBRONCHIAL NAVIGATION N/A 07/12/2020   Procedure: VIDEO BRONCHOSCOPY WITH ENDOBRONCHIAL NAVIGATION;  Surgeon: [name], MD;  Location: [place];  Service: Thoracic;  Laterality: N/A;   Social History   Social History Narrative   Retired Music therapist   Patient states former smoker. 1 1/2 ppd x 38 yrs  Quit in Jan 2002   Married - 2 weeks (4th marriage)   divorced,  remarried 84-2004 (lost wife to lung ca),  remarried (divorced),    1 son  - 57 [facility])   Alcohol use-yes (2-3 beers per week)           Immunization History  Administered Date(s) Administered   Influenza Split 06/02/2012   Influenza Whole 05/15/2010, 05/02/2011, 05/18/2013   Influenza, High Dose Seasonal PF 04/20/2017, 05/05/2018, 04/30/2019   Influenza,inj,Quad PF,6+ Mos 05/14/2014, 05/25/2015   Influenza-Unspecified 05/15/2016, 05/23/2021   PFIZER(Purple  Top)SARS-COV-2 Vaccination 09/27/2019, 10/18/2019, 05/23/2020   Pfizer Covid-19 Vaccine Bivalent Booster 4yr & up 05/23/2021   Pneumococcal Conjugate-13 05/20/2013, 05/15/2016   Pneumococcal Polysaccharide-23 07/03/2001, 05/02/2011, 05/20/2018   Td 05/15/2004   Tdap 02/17/2014   Zoster Recombinat (Shingrix) 06/26/2020, 09/20/2020   Zoster, Live 07/16/2011     Objective: Vital Signs: BP (!) 180/104 (BP Location: Right Arm, Patient Position: Sitting, Cuff Size: Normal)    Pulse 98    Ht '5\' 7"'  (1.702 m)    Wt 143 lb (64.9 kg)    BMI 22.40 kg/m    Physical Exam HENT:     Right Ear: External ear normal.     Left Ear: External ear normal.     Mouth/Throat:     Mouth: Mucous membranes are moist.     Pharynx: Oropharynx is clear.  Eyes:  Conjunctiva/sclera: Conjunctivae normal.  Cardiovascular:     Rate and Rhythm: Normal rate and regular rhythm.  Pulmonary:     Effort: Pulmonary effort is normal.     Breath sounds: Normal breath sounds.  Musculoskeletal:     Right lower leg: No edema.     Left lower leg: No edema.  Neurological:     General: No focal deficit present.     Mental Status: He is alert.  Psychiatric:        Mood and Affect: Mood normal.     Musculoskeletal Exam:  Neck full ROM no tenderness Severe crpeitus and reduced ROM right shoulder, left shoulder mildly limited Fixed finger nodules 1st cmc squaring and mcp hyperextension, heberdons nodes of both hands Right sided paraspinal tenderness in lower thoracic spine, no point tenderness over spinous processes Right lateral hip pain with flexion Bilateral knee crepitus, worse on right Ankles full ROM no tenderness or swelling   Investigation: No additional findings.  Imaging: XR Lumbar Spine 2-3 Views  Result Date: 10/22/2021 Xray lumbar spine 2 views Hardware in place in lumbar spine L1-L5 with disc spacers. No obvious shift in position. There are prominent anterior osteophytes and some disc height loss  with mild anterior height loss in vertebral body at T12. No acute bony abnormality appreciated. Impression No displacement or obvious change in hardware position, extensive lower thoracic spine degenerative arthritis with prominent osteophyte formation  XR Shoulder Left  Result Date: 10/22/2021 Xray left shoulder 4 views Mild glenohumeral joint space narrowing and cystic changes on humeral head. Periarticular calcification above the AC joint. No obvious erosions or effusions seen. Impression Mild to moderate glenohumeral and AC joint osteoarthritis, much less advanced compared to right shoulder and without soft tissue calcifications  XR Shoulder Right  Result Date: 10/22/2021 Xray right shoulder 4 views Extensive degenerative appearing changes with glenohumeral joint space narrowing and subchondral sclerosis. Numerous cystic changes around lateral and superior surfaces. Shoulder is held in slightly high position. With external rotation view. Suspect vascular calcifications seen laterally with interior rotation view. Impression Extensive osteoarthritis changes, no obvious effusions, erosions, demineralization or acute bony abnormality   Recent Labs: Lab Results  Component Value Date   WBC 5.5 08/16/2021   HGB 13.7 08/16/2021   PLT 243.0 08/16/2021   NA 138 08/16/2021   K 4.5 08/16/2021   CL 104 08/16/2021   CO2 27 08/16/2021   GLUCOSE 79 08/16/2021   BUN 29 (H) 08/16/2021   CREATININE 0.85 08/16/2021   BILITOT 0.7 08/16/2021   ALKPHOS 73 08/16/2021   AST 24 08/16/2021   ALT 17 08/16/2021   PROT 7.4 10/16/2021   ALBUMIN 4.0 08/16/2021   CALCIUM 9.5 08/16/2021   GFRAA >60 09/30/2016    Speciality Comments: No specialty comments available.  Procedures:  No procedures performed Allergies: Losartan   Assessment / Plan:     Visit Diagnoses: Chronic bilateral low back pain without sciatica - Plan: XR Lumbar Spine 2-3 Views  Currently worst problem, checking xray in clinic today  there is considerable degenerative appearing change especially at T11-T12 area above his surgical hardware, that otherwise appears to be well positioned. This is not a typical site for PMR and most inflammatory arthropathy.  Sedimentation rate elevation CRP elevated - Plan: Sedimentation rate, C-reactive protein, Cyclic citrul peptide antibody, IgG, ANA, Serum protein electrophoresis with reflex  Symptoms are largely improved elsewhere today on prednisone headache also better repeating inflammatory markers also checking CCP, ANA, and SPEP for alternative causes or  underlying malignancy for PMR. Headache problem being improved with no other findings lower suspicion of GCA.  Chronic pain of both shoulders - Plan: XR Shoulder Right, XR Shoulder Left  Bilateral shoulder pain and stiffness responsive to prednisone treatment. Xray of shoulder shows severe OA of the right shoulder not obvious for any bursitis or effusion.  High risk medication use  Continuing use of prednisone if labs look good would recommend tapering medicine to reduce side effect risk. If significant increase in inflammatory markers may need slower taper trial or alternate DMARD.  Solitary pulmonary nodule on lung CT  Orders: Orders Placed This Encounter  Procedures   XR Lumbar Spine 2-3 Views   XR Shoulder Right   XR Shoulder Left   Sedimentation rate   C-reactive protein   Cyclic citrul peptide antibody, IgG   ANA   Serum protein electrophoresis with reflex   IFE Interpretation   No orders of the defined types were placed in this encounter. tes. Greater than 50% of time was spent in counseling and coordination of care.  Follow-Up Instructions: No follow-ups on file.   Collier Salina, MD  Note - This record has been created using [employer].  Chart creation errors have been sought, but may not always  have been located. Such creation errors do not reflect on  the standard of medical care.

## 2021-10-16 ENCOUNTER — Ambulatory Visit: Payer: Self-pay

## 2021-10-16 ENCOUNTER — Encounter: Payer: Self-pay | Admitting: Internal Medicine

## 2021-10-16 ENCOUNTER — Other Ambulatory Visit: Payer: Self-pay

## 2021-10-16 ENCOUNTER — Ambulatory Visit: Payer: Medicare HMO | Admitting: Internal Medicine

## 2021-10-16 VITALS — BP 180/104 | HR 98 | Ht 67.0 in | Wt 143.0 lb

## 2021-10-16 DIAGNOSIS — Z79899 Other long term (current) drug therapy: Secondary | ICD-10-CM

## 2021-10-16 DIAGNOSIS — M25512 Pain in left shoulder: Secondary | ICD-10-CM

## 2021-10-16 DIAGNOSIS — R911 Solitary pulmonary nodule: Secondary | ICD-10-CM

## 2021-10-16 DIAGNOSIS — M25511 Pain in right shoulder: Secondary | ICD-10-CM | POA: Diagnosis not present

## 2021-10-16 DIAGNOSIS — R7982 Elevated C-reactive protein (CRP): Secondary | ICD-10-CM

## 2021-10-16 DIAGNOSIS — R700 Elevated erythrocyte sedimentation rate: Secondary | ICD-10-CM | POA: Diagnosis not present

## 2021-10-16 DIAGNOSIS — M545 Low back pain, unspecified: Secondary | ICD-10-CM | POA: Diagnosis not present

## 2021-10-16 DIAGNOSIS — G8929 Other chronic pain: Secondary | ICD-10-CM

## 2021-10-21 ENCOUNTER — Other Ambulatory Visit: Payer: Self-pay | Admitting: Family Medicine

## 2021-10-22 ENCOUNTER — Telehealth: Payer: Self-pay

## 2021-10-22 LAB — PROTEIN ELECTROPHORESIS, SERUM, WITH REFLEX
Albumin ELP: 3.8 g/dL (ref 3.8–4.8)
Alpha 1: 0.5 g/dL — ABNORMAL HIGH (ref 0.2–0.3)
Alpha 2: 1.2 g/dL — ABNORMAL HIGH (ref 0.5–0.9)
Beta 2: 0.5 g/dL (ref 0.2–0.5)
Beta Globulin: 0.6 g/dL (ref 0.4–0.6)
Gamma Globulin: 0.8 g/dL (ref 0.8–1.7)
Total Protein: 7.4 g/dL (ref 6.1–8.1)

## 2021-10-22 LAB — IFE INTERPRETATION: Immunofix Electr Int: NOT DETECTED

## 2021-10-22 LAB — C-REACTIVE PROTEIN: CRP: 15.8 mg/L — ABNORMAL HIGH (ref <8.0)

## 2021-10-22 LAB — CYCLIC CITRUL PEPTIDE ANTIBODY, IGG: Cyclic Citrullin Peptide Ab: <16 UNITS

## 2021-10-22 LAB — ANA: Anti Nuclear Antibody (ANA): NEGATIVE

## 2021-10-22 LAB — SEDIMENTATION RATE: Sed Rate: 31 mm/h — ABNORMAL HIGH (ref 0–20)

## 2021-10-22 NOTE — Telephone Encounter (Signed)
Patient left a voicemail stating "I saw Dr. Benjamine Mola last Tuesday and have been waiting since then to hear from him and have not.  He said someone would call.  What I need right now is something for the pain stronger than the Tylenol.  I'm having to take about 1300 mg of Tylenol every 4-5 hours and I know that is way too much   Please send a prescription for some kind of pain killer to the [facility] in [place].  I'm also waiting for his call to see what we are going to do next."

## 2021-10-30 ENCOUNTER — Inpatient Hospital Stay (HOSPITAL_COMMUNITY)
Admission: EM | Admit: 2021-10-30 | Discharge: 2021-11-04 | DRG: 377 | Disposition: A | Discharge status: Home / Self Care | Payer: Medicare HMO | Attending: Internal Medicine | Admitting: Internal Medicine

## 2021-10-30 ENCOUNTER — Encounter (HOSPITAL_COMMUNITY): Payer: Self-pay | Admitting: Emergency Medicine

## 2021-10-30 ENCOUNTER — Ambulatory Visit
Admission: RE | Admit: 2021-10-30 | Discharge: 2021-10-30 | Disposition: A | Discharge status: Home / Self Care | Payer: Medicare HMO | Source: Ambulatory Visit | Attending: Thoracic Surgery (Cardiothoracic Vascular Surgery) | Admitting: Thoracic Surgery (Cardiothoracic Vascular Surgery)

## 2021-10-30 ENCOUNTER — Other Ambulatory Visit: Payer: Self-pay

## 2021-10-30 ENCOUNTER — Ambulatory Visit: Payer: Medicare HMO | Admitting: Thoracic Surgery (Cardiothoracic Vascular Surgery)

## 2021-10-30 ENCOUNTER — Encounter: Payer: Self-pay | Admitting: Thoracic Surgery (Cardiothoracic Vascular Surgery)

## 2021-10-30 ENCOUNTER — Emergency Department (HOSPITAL_COMMUNITY): Payer: Medicare HMO

## 2021-10-30 ENCOUNTER — Telehealth: Payer: Self-pay | Admitting: Family Medicine

## 2021-10-30 VITALS — BP 128/76 | HR 86 | Resp 20 | Ht 67.0 in | Wt 143.0 lb

## 2021-10-30 DIAGNOSIS — Z8042 Family history of malignant neoplasm of prostate: Secondary | ICD-10-CM | POA: Diagnosis not present

## 2021-10-30 DIAGNOSIS — D62 Acute posthemorrhagic anemia: Secondary | ICD-10-CM

## 2021-10-30 DIAGNOSIS — Z888 Allergy status to other drugs, medicaments and biological substances status: Secondary | ICD-10-CM | POA: Diagnosis not present

## 2021-10-30 DIAGNOSIS — Z79899 Other long term (current) drug therapy: Secondary | ICD-10-CM | POA: Diagnosis not present

## 2021-10-30 DIAGNOSIS — E876 Hypokalemia: Secondary | ICD-10-CM | POA: Diagnosis not present

## 2021-10-30 DIAGNOSIS — R0602 Shortness of breath: Secondary | ICD-10-CM | POA: Diagnosis not present

## 2021-10-30 DIAGNOSIS — M5136 Other intervertebral disc degeneration, lumbar region: Secondary | ICD-10-CM | POA: Diagnosis present

## 2021-10-30 DIAGNOSIS — K269 Duodenal ulcer, unspecified as acute or chronic, without hemorrhage or perforation: Secondary | ICD-10-CM | POA: Diagnosis not present

## 2021-10-30 DIAGNOSIS — R911 Solitary pulmonary nodule: Secondary | ICD-10-CM

## 2021-10-30 DIAGNOSIS — K219 Gastro-esophageal reflux disease without esophagitis: Secondary | ICD-10-CM | POA: Diagnosis present

## 2021-10-30 DIAGNOSIS — I10 Essential (primary) hypertension: Secondary | ICD-10-CM | POA: Diagnosis present

## 2021-10-30 DIAGNOSIS — K449 Diaphragmatic hernia without obstruction or gangrene: Secondary | ICD-10-CM | POA: Diagnosis not present

## 2021-10-30 DIAGNOSIS — K922 Gastrointestinal hemorrhage, unspecified: Secondary | ICD-10-CM | POA: Diagnosis not present

## 2021-10-30 DIAGNOSIS — M479 Spondylosis, unspecified: Secondary | ICD-10-CM | POA: Diagnosis present

## 2021-10-30 DIAGNOSIS — M1990 Unspecified osteoarthritis, unspecified site: Secondary | ICD-10-CM | POA: Diagnosis not present

## 2021-10-30 DIAGNOSIS — D509 Iron deficiency anemia, unspecified: Secondary | ICD-10-CM | POA: Diagnosis not present

## 2021-10-30 DIAGNOSIS — Z87891 Personal history of nicotine dependence: Secondary | ICD-10-CM | POA: Diagnosis not present

## 2021-10-30 DIAGNOSIS — M549 Dorsalgia, unspecified: Secondary | ICD-10-CM | POA: Diagnosis present

## 2021-10-30 DIAGNOSIS — Z7952 Long term (current) use of systemic steroids: Secondary | ICD-10-CM | POA: Diagnosis not present

## 2021-10-30 DIAGNOSIS — Z800 Family history of malignant neoplasm of digestive organs: Secondary | ICD-10-CM | POA: Diagnosis not present

## 2021-10-30 DIAGNOSIS — J984 Other disorders of lung: Secondary | ICD-10-CM

## 2021-10-30 DIAGNOSIS — R000 Tachycardia, unspecified: Secondary | ICD-10-CM | POA: Diagnosis not present

## 2021-10-30 DIAGNOSIS — Z8249 Family history of ischemic heart disease and other diseases of the circulatory system: Secondary | ICD-10-CM

## 2021-10-30 DIAGNOSIS — E43 Unspecified severe protein-calorie malnutrition: Secondary | ICD-10-CM | POA: Diagnosis not present

## 2021-10-30 DIAGNOSIS — Z20822 Contact with and (suspected) exposure to covid-19: Secondary | ICD-10-CM | POA: Diagnosis present

## 2021-10-30 DIAGNOSIS — K649 Unspecified hemorrhoids: Secondary | ICD-10-CM

## 2021-10-30 DIAGNOSIS — R9431 Abnormal electrocardiogram [ECG] [EKG]: Secondary | ICD-10-CM | POA: Diagnosis not present

## 2021-10-30 DIAGNOSIS — J449 Chronic obstructive pulmonary disease, unspecified: Secondary | ICD-10-CM | POA: Diagnosis not present

## 2021-10-30 DIAGNOSIS — Z7951 Long term (current) use of inhaled steroids: Secondary | ICD-10-CM

## 2021-10-30 DIAGNOSIS — Z791 Long term (current) use of non-steroidal anti-inflammatories (NSAID): Secondary | ICD-10-CM | POA: Diagnosis not present

## 2021-10-30 DIAGNOSIS — T39395A Adverse effect of other nonsteroidal anti-inflammatory drugs [NSAID], initial encounter: Secondary | ICD-10-CM | POA: Diagnosis present

## 2021-10-30 DIAGNOSIS — K921 Melena: Secondary | ICD-10-CM | POA: Diagnosis not present

## 2021-10-30 DIAGNOSIS — Z6821 Body mass index (BMI) 21.0-21.9, adult: Secondary | ICD-10-CM

## 2021-10-30 DIAGNOSIS — G8929 Other chronic pain: Secondary | ICD-10-CM | POA: Diagnosis present

## 2021-10-30 DIAGNOSIS — J439 Emphysema, unspecified: Secondary | ICD-10-CM | POA: Diagnosis not present

## 2021-10-30 DIAGNOSIS — R042 Hemoptysis: Secondary | ICD-10-CM | POA: Diagnosis present

## 2021-10-30 DIAGNOSIS — K264 Chronic or unspecified duodenal ulcer with hemorrhage: Secondary | ICD-10-CM | POA: Diagnosis not present

## 2021-10-30 DIAGNOSIS — K260 Acute duodenal ulcer with hemorrhage: Principal | ICD-10-CM

## 2021-10-30 DIAGNOSIS — I700 Atherosclerosis of aorta: Secondary | ICD-10-CM | POA: Diagnosis not present

## 2021-10-30 DIAGNOSIS — M51369 Other intervertebral disc degeneration, lumbar region without mention of lumbar back pain or lower extremity pain: Secondary | ICD-10-CM

## 2021-10-30 DIAGNOSIS — R008 Other abnormalities of heart beat: Secondary | ICD-10-CM | POA: Diagnosis not present

## 2021-10-30 LAB — CBC
HCT: 17.7 % — ABNORMAL LOW (ref 39.0–52.0)
Hemoglobin: 5.7 g/dL — CL (ref 13.0–17.0)
MCH: 32 pg (ref 26.0–34.0)
MCHC: 32.2 g/dL (ref 30.0–36.0)
MCV: 99.4 fL (ref 80.0–100.0)
Platelets: 266 10*3/uL (ref 150–400)
RBC: 1.78 MIL/uL — ABNORMAL LOW (ref 4.22–5.81)
RDW: 15.5 % (ref 11.5–15.5)
WBC: 10.2 10*3/uL (ref 4.0–10.5)
nRBC: 0.2 % (ref 0.0–0.2)

## 2021-10-30 LAB — I-STAT CHEM 8, ED
BUN: 45 mg/dL — ABNORMAL HIGH (ref 8–23)
Calcium, Ion: 1.25 mmol/L (ref 1.15–1.40)
Chloride: 106 mmol/L (ref 98–111)
Creatinine, Ser: 1.1 mg/dL (ref 0.61–1.24)
Glucose, Bld: 171 mg/dL — ABNORMAL HIGH (ref 70–99)
HCT: 17 % — ABNORMAL LOW (ref 39.0–52.0)
Hemoglobin: 5.8 g/dL — CL (ref 13.0–17.0)
Potassium: 3.8 mmol/L (ref 3.5–5.1)
Sodium: 136 mmol/L (ref 135–145)
TCO2: 21 mmol/L — ABNORMAL LOW (ref 22–32)

## 2021-10-30 LAB — COMPREHENSIVE METABOLIC PANEL
ALT: 15 U/L (ref 0–44)
AST: 19 U/L (ref 15–41)
Albumin: 2.6 g/dL — ABNORMAL LOW (ref 3.5–5.0)
Alkaline Phosphatase: 33 U/L — ABNORMAL LOW (ref 38–126)
Anion gap: 9 (ref 5–15)
BUN: 50 mg/dL — ABNORMAL HIGH (ref 8–23)
CO2: 22 mmol/L (ref 22–32)
Calcium: 9 mg/dL (ref 8.9–10.3)
Chloride: 105 mmol/L (ref 98–111)
Creatinine, Ser: 1.05 mg/dL (ref 0.61–1.24)
GFR, Estimated: >60 mL/min (ref >60)
Glucose, Bld: 144 mg/dL — ABNORMAL HIGH (ref 70–99)
Potassium: 3.9 mmol/L (ref 3.5–5.1)
Sodium: 136 mmol/L (ref 135–145)
Total Bilirubin: 0.6 mg/dL (ref 0.3–1.2)
Total Protein: 5.4 g/dL — ABNORMAL LOW (ref 6.5–8.1)

## 2021-10-30 LAB — IRON AND TIBC
Iron: 20 ug/dL — ABNORMAL LOW (ref 45–182)
Saturation Ratios: 6 % — ABNORMAL LOW (ref 17.9–39.5)
TIBC: 342 ug/dL (ref 250–450)
UIBC: 322 ug/dL

## 2021-10-30 LAB — RESP PANEL BY RT-PCR (FLU A&B, COVID) ARPGX2
Influenza A by PCR: NEGATIVE
Influenza B by PCR: NEGATIVE
SARS Coronavirus 2 by RT PCR: NEGATIVE

## 2021-10-30 LAB — PROTIME-INR
INR: 1.1 (ref 0.8–1.2)
Prothrombin Time: 13.8 seconds (ref 11.4–15.2)

## 2021-10-30 LAB — FOLATE: Folate: 29.9 ng/mL (ref >5.9)

## 2021-10-30 LAB — VITAMIN B12: Vitamin B-12: 378 pg/mL (ref 180–914)

## 2021-10-30 LAB — POC OCCULT BLOOD, ED: Fecal Occult Bld: POSITIVE — AB

## 2021-10-30 LAB — FERRITIN: Ferritin: 32 ng/mL (ref 24–336)

## 2021-10-30 LAB — PREPARE RBC (CROSSMATCH)

## 2021-10-30 MED ORDER — IPRATROPIUM-ALBUTEROL 0.5-2.5 (3) MG/3ML IN SOLN: 3.0000 mL | Freq: Four times a day (QID) | RESPIRATORY_TRACT | Status: DC | PRN

## 2021-10-30 MED ORDER — UMECLIDINIUM BROMIDE 62.5 MCG/ACT IN AEPB
1.0000 | INHALATION_SPRAY | Freq: Every day | RESPIRATORY_TRACT | Status: DC
  Administered 2021-11-01 – 2021-11-04 (×4): 1 via RESPIRATORY_TRACT
  Filled 2021-10-30 (×2): qty 7

## 2021-10-30 MED ORDER — ALBUTEROL SULFATE HFA 108 (90 BASE) MCG/ACT IN AERS: 2.0000 | INHALATION_SPRAY | RESPIRATORY_TRACT | Status: DC | PRN

## 2021-10-30 MED ORDER — ACETAMINOPHEN 325 MG PO TABS
650.0000 mg | ORAL_TABLET | Freq: Four times a day (QID) | ORAL | Status: DC | PRN
  Administered 2021-11-01 – 2021-11-03 (×6): 650 mg via ORAL
  Filled 2021-10-30 (×6): qty 2

## 2021-10-30 MED ORDER — ACETAMINOPHEN 650 MG RE SUPP
650.0000 mg | Freq: Four times a day (QID) | RECTAL | Status: DC | PRN
Start: 2021-10-30 — End: 2021-11-04

## 2021-10-30 MED ORDER — HYDROMORPHONE HCL 1 MG/ML IJ SOLN
1.0000 mg | INTRAMUSCULAR | Status: AC | PRN
  Administered 2021-11-02 – 2021-11-04 (×3): 1 mg via INTRAVENOUS
  Filled 2021-10-30 (×3): qty 1

## 2021-10-30 MED ORDER — LACTATED RINGERS IV SOLN: INTRAVENOUS | Status: DC

## 2021-10-30 MED ORDER — FLUTICASONE FUROATE-VILANTEROL 100-25 MCG/ACT IN AEPB
1.0000 | INHALATION_SPRAY | Freq: Every day | RESPIRATORY_TRACT | Status: DC
  Administered 2021-11-01 – 2021-11-04 (×4): 1 via RESPIRATORY_TRACT
  Filled 2021-10-30 (×2): qty 28

## 2021-10-30 MED ORDER — PANTOPRAZOLE 80MG IVPB - SIMPLE MED
80.0000 mg | Freq: Once | INTRAVENOUS | Status: AC
  Administered 2021-10-30: 80 mg via INTRAVENOUS
  Filled 2021-10-30: qty 80

## 2021-10-30 MED ORDER — METOPROLOL TARTRATE 5 MG/5ML IV SOLN
5.0000 mg | Freq: Four times a day (QID) | INTRAVENOUS | Status: DC | PRN
  Administered 2021-10-31: 5 mg via INTRAVENOUS
  Filled 2021-10-30: qty 5

## 2021-10-30 MED ORDER — PANTOPRAZOLE SODIUM 40 MG IV SOLR
40.0000 mg | Freq: Two times a day (BID) | INTRAVENOUS | Status: DC
  Administered 2021-11-03 – 2021-11-04 (×3): 40 mg via INTRAVENOUS
  Filled 2021-10-30 (×3): qty 10

## 2021-10-30 MED ORDER — ONDANSETRON HCL 4 MG PO TABS: 4.0000 mg | ORAL_TABLET | Freq: Four times a day (QID) | ORAL | Status: DC | PRN

## 2021-10-30 MED ORDER — ONDANSETRON HCL 4 MG/2ML IJ SOLN: 4.0000 mg | Freq: Four times a day (QID) | INTRAMUSCULAR | Status: DC | PRN

## 2021-10-30 MED ORDER — SODIUM CHLORIDE 0.9 % IV BOLUS
500.0000 mL | Freq: Once | INTRAVENOUS | Status: AC
  Administered 2021-10-30: 500 mL via INTRAVENOUS

## 2021-10-30 MED ORDER — PANTOPRAZOLE INFUSION (NEW) - SIMPLE MED
8.0000 mg/h | INTRAVENOUS | Status: AC
  Administered 2021-10-30 – 2021-11-02 (×6): 8 mg/h via INTRAVENOUS
  Filled 2021-10-30 (×4): qty 80
  Filled 2021-10-30: qty 100
  Filled 2021-10-30 (×2): qty 80

## 2021-10-30 MED ORDER — ONDANSETRON HCL 4 MG/2ML IJ SOLN
4.0000 mg | Freq: Once | INTRAMUSCULAR | Status: AC
  Administered 2021-10-30: 4 mg via INTRAVENOUS
  Filled 2021-10-30: qty 2

## 2021-10-30 MED ORDER — BENZONATATE 100 MG PO CAPS: 100.0000 mg | ORAL_CAPSULE | Freq: Three times a day (TID) | ORAL | 2 refills | Status: DC | PRN

## 2021-10-30 MED ORDER — ALBUTEROL SULFATE (2.5 MG/3ML) 0.083% IN NEBU: 2.5000 mg | INHALATION_SOLUTION | RESPIRATORY_TRACT | Status: DC | PRN

## 2021-10-30 NOTE — ED Triage Notes (Signed)
Patient arrives POV c/o constipation on Friday. Starting Saturday having black stool with bright red blood. Reports having to lay down after small ADLs. No blood thinners. Reports hx of hemorrhoids. Reports lower back and right shoulder pain consistent since December- taking tylenol daily.

## 2021-10-30 NOTE — ED Provider Notes (Signed)
Emergency Department Provider Note   I have reviewed the triage vital signs and the nursing notes.   HISTORY  Chief Complaint Rectal Bleeding   HPI [name] is a 76 y.o. male with past medical history reviewed below, not anticoagulated, presents to the emergency department with black stools over the past several days.  He saw his cardiothoracic surgeon today who he follows with regarding his prior history of lobectomy on the right.  They evaluated him and reviewed his CT scan chest.  He mentioned that he is having black stools over the past several days and was referred here for further evaluation.  He is not feeling weakness or fatigue.  Upon being placed in an exam room, patient had some vomiting of bright red blood which had not been a symptom for him previously.  No coffee-ground material.  He is not feeling particularly short of breath or having chest pain. Denies abdominal pain or fever.    Past Medical History:  Diagnosis Date   Allergic rhinitis    Atypical chest pain 12/01/2014   Back pain 01/12/2013   COPD (chronic obstructive pulmonary disease) (HCC)    FeV1 64%-2007   Dyspnea    Emphysema    GERD (gastroesophageal reflux disease)    History of lung abscess    bronchiectasis with RMLandRLL ersection -1996- Dr Arlyce Dice   Hordeolum externum (stye) 04/23/2015   Right eye   Hypertension    Impaired vision    glasses   Osteoarthritis    hands and knees   Sinusitis, acute 04/23/2015    Review of Systems  Constitutional: No fever/chills Eyes: No visual changes. ENT: No sore throat. Cardiovascular: Denies chest pain. Respiratory: Denies shortness of breath. Gastrointestinal: No abdominal pain.  No nausea, no vomiting.  No diarrhea.  No constipation. Positive melena and vomiting blood.  Genitourinary: Negative for dysuria. Musculoskeletal: Negative for back pain. Skin: Negative for rash. Neurological: Negative for headaches, focal weakness or  numbness.   ____________________________________________   PHYSICAL EXAM:  VITAL SIGNS: ED Triage Vitals  Enc Vitals Group     BP 10/30/21 1618 (!) 164/84     Pulse Rate 10/30/21 1618 (!) 117     Resp 10/30/21 1618 16     Temp 10/30/21 1618 98.9 F (37.2 C)     Temp Source 10/30/21 1618 Oral     SpO2 10/30/21 1618 97 %     Weight 10/30/21 1619 143 lb (64.9 kg)     Height 10/30/21 1619 5\' 7"  (1.702 m)   Constitutional: Alert and oriented. Patient with heavy breathing. Able to provide a history but appears unwell.  Eyes: Conjunctivae are normal.  Head: Atraumatic. Nose: No congestion/rhinnorhea. Mouth/Throat: Mucous membranes are moist.  Neck: No stridor.   Cardiovascular: Normal rate, regular rhythm. Good peripheral circulation. Grossly normal heart sounds.   Respiratory: Normal respiratory effort.  No retractions. Lungs CTAB. Gastrointestinal: Soft and nontender. No distention. Rectal exam without clear melena but streaks of BRB.  Musculoskeletal: No lower extremity tenderness nor edema. No gross deformities of extremities. Neurologic:  Normal speech and language. No gross focal neurologic deficits are appreciated.  Skin:  Skin is warm, dry and intact. No rash noted.  ____________________________________________   LABS (all labs ordered are listed, but only abnormal results are displayed)  Labs Reviewed  COMPREHENSIVE METABOLIC PANEL - Abnormal; Notable for the following components:      Result Value   Glucose, Bld 144 (*)    BUN 50 (*)  Total Protein 5.4 (*)    Albumin 2.6 (*)    Alkaline Phosphatase 33 (*)    All other components within normal limits  CBC - Abnormal; Notable for the following components:   RBC 1.78 (*)    Hemoglobin 5.7 (*)    HCT 17.7 (*)    All other components within normal limits  IRON AND TIBC - Abnormal; Notable for the following components:   Iron 20 (*)    Saturation Ratios 6 (*)    All other components within normal limits   RETICULOCYTES - Abnormal; Notable for the following components:   Retic Ct Pct 4.2 (*)    RBC. 2.43 (*)    Immature Retic Fract 39.3 (*)    All other components within normal limits  BASIC METABOLIC PANEL - Abnormal; Notable for the following components:   BUN 35 (*)    Calcium 7.9 (*)    All other components within normal limits  CBC - Abnormal; Notable for the following components:   RBC 2.41 (*)    Hemoglobin 7.8 (*)    HCT 23.2 (*)    All other components within normal limits  COMPREHENSIVE METABOLIC PANEL - Abnormal; Notable for the following components:   Calcium 8.2 (*)    Total Protein 4.6 (*)    Albumin 2.4 (*)    Alkaline Phosphatase 33 (*)    All other components within normal limits  CBC - Abnormal; Notable for the following components:   RBC 2.70 (*)    Hemoglobin 8.6 (*)    HCT 25.4 (*)    nRBC 0.3 (*)    All other components within normal limits  CBC - Abnormal; Notable for the following components:   RBC 2.51 (*)    Hemoglobin 8.1 (*)    HCT 24.1 (*)    All other components within normal limits  POC OCCULT BLOOD, ED - Abnormal; Notable for the following components:   Fecal Occult Bld POSITIVE (*)    All other components within normal limits  I-STAT CHEM 8, ED - Abnormal; Notable for the following components:   BUN 45 (*)    Glucose, Bld 171 (*)    TCO2 21 (*)    Hemoglobin 5.8 (*)    HCT 17.0 (*)    All other components within normal limits  RESP PANEL BY RT-PCR (FLU A&B, COVID) ARPGX2  PROTIME-INR  VITAMIN B12  FOLATE  FERRITIN  MAGNESIUM  HEMOGLOBIN AND HEMATOCRIT, BLOOD  H. PYLORI ANTIBODY, IGG  TYPE AND SCREEN  PREPARE RBC (CROSSMATCH)  PREPARE RBC (CROSSMATCH)   ____________________________________________  EKG   EKG Interpretation  Date/Time:  Tuesday October 30 2021 18:04:54 EST Ventricular Rate:  127 PR Interval:  162 QRS Duration: 96 QT Interval:  320 QTC Calculation: 466 R Axis:   55 Text Interpretation: Sinus tachycardia  Multiple premature complexes, vent & supraven RSR' in V1 or V2, right VCD or RVH Confirmed by Nanda Quinton [phone]) on 10/30/2021 6:09:37 PM        ____________________________________________  RADIOLOGY  CT Chest Wo Contrast  Result Date: 10/30/2021 CLINICAL DATA:  Follow-up lung nodule. History of right upper and right middle lobectomies. EXAM: CT CHEST WITHOUT CONTRAST TECHNIQUE: Multidetector CT imaging of the chest was performed following the standard protocol without IV contrast. RADIATION DOSE REDUCTION: This exam was performed according to the departmental dose-optimization program which includes automated exposure control, adjustment of the mA and/or kV according to patient size and/or use of iterative reconstruction technique. COMPARISON:  05/01/2021 FINDINGS: Cardiovascular: The heart size appears within normal limits. Aortic atherosclerosis with coronary artery calcifications. No pericardial effusion. Mediastinum/Nodes: No enlarged mediastinal or axillary lymph nodes. Thyroid gland, trachea, and esophagus demonstrate no significant findings. Lungs/Pleura: Centrilobular emphysema. Status post right upper and right middle lobectomies. Pleural thickening overlying the apical portion of the right lung is again noted and appears similar to the previous exam, image 38/2. The cavitary component of the pleural thickening has resolved when compared with the previous exam. Similar appearance of pleuroparenchymal scarring within the posterior and lateral right lung base. Stable chronic appearing subpleural collections of gas within the posterior right lung base, image 98/2. The appearance is unchanged compared with 08/29/2020. Upper Abdomen: Aortic atherosclerosis. No acute abnormality within the imaged portions of the upper abdomen. Musculoskeletal: Postop change from ACDF. Previous PLIF noted within the lumbar spine. Degenerative disc disease identified within the lower thoracic spine. No acute or  suspicious osseous findings. IMPRESSION: 1. Status post right upper and right middle lobectomies. 2. Stable appearance of pleural thickening overlying the apical portion of the right lung. The cavitary component within this area has resolved in the interval. 3. Stable appearance of pleuroparenchymal scarring within the right lung base with stable chronic areas of loculated pleural gas overlying the posterior left base. 4. Aortic Atherosclerosis (ICD10-I70.0) and Emphysema (ICD10-J43.9). Coronary artery calcifications. Electronically Signed   By: Kerby Moors M.D.   On: 10/30/2021 14:50    ____________________________________________   PROCEDURES  Procedure(s) performed:   Procedures  CRITICAL CARE Performed by: Margette Fast Total critical care time: 35 minutes Critical care time was exclusive of separately billable procedures and treating other patients. Critical care was necessary to treat or prevent imminent or life-threatening deterioration. Critical care was time spent personally by me on the following activities: development of treatment plan with patient and/or surrogate as well as nursing, discussions with consultants, evaluation of patient's response to treatment, examination of patient, obtaining history from patient or surrogate, ordering and performing treatments and interventions, ordering and review of laboratory studies, ordering and review of radiographic studies, pulse oximetry and re-evaluation of patient's condition.  Nanda Quinton, MD Emergency Medicine  ____________________________________________   INITIAL IMPRESSION / ASSESSMENT AND PLAN / ED COURSE  Pertinent labs & imaging results that were available during my care of the patient were reviewed by me and considered in my medical decision making (see chart for details).   This patient is Presenting for Evaluation of melena and hematemesis, which does require a range of treatment options, and is a complaint that  involves a high risk of morbidity and mortality.  The Differential Diagnoses include upper GI bleeding, AVM, diverticular bleeding, hemoptysis, PE  Critical Interventions- IVF, nausea medication, and PRBC transfusion along with protonix.    Medications  pantoprozole (PROTONIX) 80 mg /NS 100 mL infusion (8 mg/hr Intravenous New Bag/Given 10/31/21 1509)  pantoprazole (PROTONIX) injection 40 mg ( Intravenous MAR Unhold 10/31/21 1358)  fluticasone furoate-vilanterol (BREO ELLIPTA) 100-25 MCG/ACT 1 puff (1 puff Inhalation Given 11/01/21 0834)    And  umeclidinium bromide (INCRUSE ELLIPTA) 62.5 MCG/ACT 1 puff (1 puff Inhalation Given by Other 11/01/21 0835)  ipratropium-albuterol (DUONEB) 0.5-2.5 (3) MG/3ML nebulizer solution 3 mL ( Nebulization MAR Unhold 10/31/21 1358)  lactated ringers infusion ( Intravenous New Bag/Given 10/31/21 1508)  acetaminophen (TYLENOL) tablet 650 mg ( Oral MAR Unhold 10/31/21 1358)    Or  acetaminophen (TYLENOL) suppository 650 mg ( Rectal MAR Unhold 10/31/21 1358)  HYDROmorphone (DILAUDID) injection 1 mg (  Intravenous MAR Unhold 10/31/21 1358)  ondansetron (ZOFRAN) tablet 4 mg ( Oral MAR Unhold 10/31/21 1358)    Or  ondansetron (ZOFRAN) injection 4 mg ( Intravenous MAR Unhold 10/31/21 1358)  metoprolol tartrate (LOPRESSOR) injection 5 mg (5 mg Intravenous Given 10/31/21 1930)  albuterol (PROVENTIL) (2.5 MG/3ML) 0.083% nebulizer solution 2.5 mg ( Nebulization MAR Unhold 10/31/21 1358)  amisulpride (BARHEMSYS) 5 MG/2ML injection (  Not Given 10/31/21 1416)  sodium chloride 0.9 % bolus 500 mL (0 mLs Intravenous Stopped 10/30/21 2341)  pantoprazole (PROTONIX) 80 mg /NS 100 mL IVPB (0 mg Intravenous Stopped 10/30/21 1915)  ondansetron (ZOFRAN) injection 4 mg (4 mg Intravenous Given 10/30/21 1854)  amisulpride (BARHEMSYS) injection 10 mg (10 mg Intravenous Given 10/31/21 1325)  0.9 %  sodium chloride infusion (Manually program via Guardrails IV Fluids) (0 mLs Intravenous Stopped 11/01/21 0824)     Reassessment after intervention: No additional hematemesis.   I decided to review pertinent External Data, and in summary no recent ED visits for similar. Seen in follow up today with cardiothoracic surgery. .   Clinical Laboratory Tests Ordered, included H/H is low. No AKI or electrolyte disturbance. Hemoccult positive.   Radiologic Tests Ordered, included CXR. I independently interpreted the images and agree with radiology interpretation.   Cardiac Monitor Tracing which shows sinus tachycardia   Social Determinants of Health Risk patient is a former smoker.   Consult complete with [facility], Dr. Fuller Plan (6:45 PM).  Discussed the case along with lab findings and exam.  Patient has not been hypotensive.  His tachycardia is improved here with IV fluid bolus.  PRBCs have been started at the bedside along with Protonix.  Dr. Fuller Plan advises continued resuscitation.  If patient worsens from a hemodynamic perspective he should be re-consulted, otherwise, plan for endoscopy in the morning  Medical Decision Making: Summary:  Patient presents to the emergency department with report of black bowel movements over the past several days.  He is not anticoagulated.  He is not taking high doses of NSAIDs.  No abdominal pain.  Had a CT of his chest today for routine follow-up following his lobectomy.  No chest symptoms currently.  Reevaluation with update and discussion with patient. Feeling well. Tachycardia improving. Plan for [facility] admit.   Discussed patient's case with [facility] to request admission. Patient and family (if present) updated with plan. Care transferred to [facility] service.   Disposition: admit  ____________________________________________  FINAL CLINICAL IMPRESSION(S) / ED DIAGNOSES  Final diagnoses:  Gastrointestinal hemorrhage, unspecified gastrointestinal hemorrhage type    Note:  This document was prepared using Dragon voice recognition software and may include  unintentional dictation errors.  Nanda Quinton, MD, [facility] Emergency Medicine    Dabria Wadas, Wonda [facility], MD 11/01/21 [phone]

## 2021-10-30 NOTE — Telephone Encounter (Signed)
Nurse Assessment Nurse: Linward Headland, RN, Ana Date/Time (Eastern Time): 10/30/2021 3:48:06 PM Confirm and document reason for call. If symptomatic, describe symptoms. ---Caller states he was dealing with constipation on Friday, started having black stool and rectal bleeding on Saturday. Does the patient have any new or worsening symptoms? ---Yes Will a triage be completed? ---Yes Related visit to physician within the last 2 weeks? ---N/A Does the PT have any chronic conditions? (i.e. diabetes, asthma, this includes High risk factors for pregnancy, etc.) ---Yes List chronic conditions. ---COPD, Middle upper lobe of right lung removed Is this a behavioral health or substance abuse call? ---No Guidelines Guideline Title Affirmed Question Affirmed Notes Nurse Date/Time (Eastern Time) Breathing Difficulty Bluish (or gray) lips or face now Meyer Cory 10/30/2021 3:49:30 PM Disp. Time Eilene Ghazi Time) Disposition Final User 10/30/2021 3:52:59 PM 911 Outcome Documentation Linward Headland, RN, [place] Reason: patient is already at [facility] PLEASE NOTE: All timestamps contained within this report are represented as [place] Standard Time. CONFIDENTIALTY NOTICE: This fax transmission is intended only for the addressee. It contains information that is legally privileged, confidential or otherwise protected from use or disclosure. If you are not the intended recipient, you are strictly prohibited from reviewing, disclosing, copying using or disseminating any of this information or taking any action in reliance on or regarding this information. If you have received this fax in error, please notify us immediately by telephone so that we can arrange for its return to [place]. Phone: [phone], Toll-Free: [phone], Fax: [phone] Page: 2 of 2 Call Id: [id number] 10/30/2021 3:52:36 PM Call EMS 911 Now Yes Linward Headland, RN, Norma Fredrickson Disagree/Comply Disagree Caller Understands Yes PreDisposition  InappropriateToAsk Care Advice Given Per Guideline CALL EMS 911 NOW: * Triager Discretion: I'll call you back in a few minutes to be sure you were able to reach them. CARE ADVICE given per Breathing Difficulty (Adult) guideline. Comments User: Jodelle Green, RN Date/Time Eilene Ghazi Time): 10/30/2021 3:52:26 PM Patient is currently at [facility] in pulmonologists office and will be going straight to the ER. Referrals [facility] - ED

## 2021-10-30 NOTE — Progress Notes (Signed)
[address]       [place] [address]             [phone]     HPI: Mr. [name] returns for follow-up of a cavitary lesion at the right apex  [name] is a 76 year old man with a history of a right upper and middle bilobectomy for bronchiectasis and hemoptysis in 1996, tobacco abuse, COPD, hypertension, reflux, arthritis, and hemoptysis.  He had recurrent hemoptysis in July 2021.  CT of the chest showed a cystic area in the superior aspect the right chest was unclear if this was in the pleura or in the superior segment of the lower lobe.  Navigational bronchoscopy was nondiagnostic.  He had severe airway distortion.  BAL did grow staph and Acinetobacter.  He was treated with a Z-Pak and Augmentin but did not improve.  We gave him a 2-week course of Cipro in February 2022 and his hemoptysis did improve after that.  It recurred soon after and we treated him with a 4-week course and then his hemoptysis resolved completely.  He was last in the office in August 2022.  He had had a viral illness.  He was given Tessalon for cough.  That was the first time he really had relief of his cough.  He is not having any hemoptysis.  He says that he has been having black stools for the last 3 days.  He complains of fatigue, weakness, lightheadedness, and shortness of breath with exertion.  Past Medical History:  Diagnosis Date   Allergic rhinitis    Atypical chest pain 12/01/2014   Back pain 01/12/2013   COPD (chronic obstructive pulmonary disease) (HCC)    FeV1 64%-2007   Dyspnea    Emphysema    GERD (gastroesophageal reflux disease)    History of lung abscess    bronchiectasis with RMLandRLL ersection -1996- Dr Arlyce Dice   Hordeolum externum (stye) 04/23/2015   Right eye   Hypertension    Impaired vision    glasses   Osteoarthritis    hands and knees   Sinusitis, acute 04/23/2015  '  Current Outpatient Medications  Medication Sig Dispense Refill   acetaminophen (TYLENOL)  650 MG CR tablet Take 1,300 mg by mouth every 8 (eight) hours as needed for pain.     albuterol (VENTOLIN HFA) 108 (90 Base) MCG/ACT inhaler INHALE 2 PUFFS INTO THE LUNGS EVERY 6 HOURS AS NEEDED FOR WHEEZING OR SHORTNESS OF BREATH. 2 each 0   amLODipine (NORVASC) 5 MG tablet TAKE 1 TABLET EVERY DAY 90 tablet 1   calcium elemental as carbonate (BARIATRIC TUMS ULTRA) 400 MG chewable tablet Chew 1,000 mg by mouth 2 (two) times daily.      Capsaicin-Menthol (SALONPAS GEL EX) Apply 1 application topically daily as needed (arthritis pain).     cetirizine (ZYRTEC) 10 MG tablet Take 10 mg by mouth daily.     diclofenac Sodium (VOLTAREN) 1 % GEL Apply 2 g topically 3 (three) times daily as needed (pain).     doxycycline (VIBRAMYCIN) 100 MG capsule Take 1 capsule (100 mg total) by mouth 2 (two) times daily. 20 capsule 0   fluticasone (FLONASE) 50 MCG/ACT nasal spray Place 2 sprays into both nostrils daily. 16 g 5   Glucosamine-Chondroitin 750-600 MG TABS Take 1 tablet by mouth 2 (two) times daily.     Guaifenesin 1200 MG TB12 Take 600 mg by mouth 2 (two) times daily.     imiquimod (ALDARA)  5 % cream Apply topically 3 (three) times a week. 12 each 1   loratadine (CLARITIN) 10 MG tablet Take 10 mg by mouth daily.     meloxicam (MOBIC) 15 MG tablet TAKE 1 TABLET EVERY DAY AS NEEDED FOR PAIN 90 tablet 1   Menthol (ROBITUSSIN COUGH DROPS MT) Use as directed 1 lozenge in the mouth or throat 4 (four) times daily as needed (cough).      Menthol, Topical Analgesic, (BLUE-EMU MAXIMUM STRENGTH EX) Apply 1 application topically daily as needed (arthritis pain).      montelukast (SINGULAIR) 10 MG tablet TAKE 1 TABLET EVERY DAY 90 tablet 3   Multiple Vitamin (MULTIVITAMIN WITH MINERALS) TABS tablet Take 1 tablet by mouth daily.      predniSONE (DELTASONE) 20 MG tablet Take 1 tablet (20 mg total) by mouth daily with breakfast. (Patient taking differently: Take 10 mg by mouth daily with breakfast.) 30 tablet 1   TRELEGY  ELLIPTA 100-62.5-25 MCG/ACT AEPB INHALE 1 PUFF INTO THE LUNGS DAILY 180 each 1   benzonatate (TESSALON) 100 MG capsule Take 1 capsule (100 mg total) by mouth 3 (three) times daily as needed for cough. 40 capsule 2   No current facility-administered medications for this visit.    Physical Exam BP 128/76 (BP Location: Left Arm, Patient Position: Sitting, Cuff Size: Normal)    Pulse 86    Resp 20    Ht _0  (1.702 m)    Wt 143 lb (64.9 kg)    SpO2 95% Comment: RA   BMI 22.69 kg/m  76 year old man in no acute distress Alert and oriented x3 with no focal deficits Lungs with diminished breath sounds at right base, no rales or wheezing Cardiac regular rate and rhythm  Diagnostic Tests: CT CHEST WITHOUT CONTRAST   TECHNIQUE: Multidetector CT imaging of the chest was performed following the standard protocol without IV contrast.   RADIATION DOSE REDUCTION: This exam was performed according to the departmental dose-optimization program which includes automated exposure control, adjustment of the mA and/or kV according to patient size and/or use of iterative reconstruction technique.   COMPARISON:  05/01/2021   FINDINGS: Cardiovascular: The heart size appears within normal limits. Aortic atherosclerosis with coronary artery calcifications. No pericardial effusion.   Mediastinum/Nodes: No enlarged mediastinal or axillary lymph nodes. Thyroid gland, trachea, and esophagus demonstrate no significant findings.   Lungs/Pleura: Centrilobular emphysema.   Status post right upper and right middle lobectomies. Pleural thickening overlying the apical portion of the right lung is again noted and appears similar to the previous exam, image 38/2. The cavitary component of the pleural thickening has resolved when compared with the previous exam.   Similar appearance of pleuroparenchymal scarring within the posterior and lateral right lung base. Stable chronic appearing subpleural collections  of gas within the posterior right lung base, image 98/2. The appearance is unchanged compared with 08/29/2020.   Upper Abdomen: Aortic atherosclerosis. No acute abnormality within the imaged portions of the upper abdomen.   Musculoskeletal: Postop change from ACDF. Previous PLIF noted within the lumbar spine. Degenerative disc disease identified within the lower thoracic spine. No acute or suspicious osseous findings.   IMPRESSION: 1. Status post right upper and right middle lobectomies. 2. Stable appearance of pleural thickening overlying the apical portion of the right lung. The cavitary component within this area has resolved in the interval. 3. Stable appearance of pleuroparenchymal scarring within the right lung base with stable chronic areas of loculated pleural gas overlying the posterior  left base. 4. Aortic Atherosclerosis (ICD10-I70.0) and Emphysema (ICD10-J43.9). Coronary artery calcifications.     Electronically Signed   By: Kerby Moors M.D.   On: 10/30/2021 14:50 I personally reviewed the CT images.  There is been interval resolution of the cavitary lesion in the right apex.  There is some pleural thickening in that area.  Otherwise unchanged.  Impression: [name] is a 76 year old man with a history of a right upper and middle bilobectomy for bronchiectasis and hemoptysis in 1996, tobacco abuse, COPD, hypertension, reflux, arthritis, and hemoptysis.   Hemoptysis-began in July 2021.  Finally resolved after a prolonged course of ciprofloxacin.  None at present.  Does have persistent cough.  Says that Lavella Lemons is helping and requests refill.  Cavitary lesion right apex-still unclear if this was pleural or parenchymal base but I suspect based on his resolution it was parenchymal based.  In any event it has resolved.  With his history of tobacco abuse we will plan to repeat a CT in a year.  Melena-symptoms consistent with anemia.  He will go to the emergency room  when he leaves the office.  Plan: Go to ED for evaluation of melena and symptomatic anemia. Return in 1 year with CT chest  [name], MD [facility] [phone]

## 2021-10-30 NOTE — Assessment & Plan Note (Signed)
Hold Mobic and prednisone with acute GI bleeding.  Provide Dilaudid for pain control overnight.

## 2021-10-30 NOTE — ED Notes (Addendum)
Pt A&O4, GCS 15, resting comfortably, denies pain, sob, n/v. First unit of blood infusing. VSS, tachycardia (100-115bpm) with frequent PVCs noted on monitor. Pt provided warm blanket.

## 2021-10-30 NOTE — ED Notes (Signed)
Hgb 5.7 called from lab Dr Darl Householder notified

## 2021-10-30 NOTE — Assessment & Plan Note (Signed)
[name] is admitted to Progressive care unit.  Started on Protonix infusion.  IVF hydration Transfuse PRBC Hold NSAIDs and Prednsone GI consulted and will see in am to perform EGD. If pt has further hemoptysis tonight or becomes unstable GI will do EGD emergently. Case discussed with Dr Fuller Plan by ER physician

## 2021-10-30 NOTE — ED Notes (Signed)
MD at bedside. 

## 2021-10-30 NOTE — Assessment & Plan Note (Signed)
Continue Trelogy inhaler. Duoneb as needed.

## 2021-10-30 NOTE — H&P (Signed)
History and Physical    Patient: [name] YPP:[id number] DOB: 1946/04/19 DOA: 10/30/2021 DOS: the patient was seen and examined on 10/30/2021 PCP: Mosie Lukes, MD  Patient coming from: Home  Chief Complaint:  Chief Complaint  Patient presents with   Rectal Bleeding    HPI: [name] is a 76 y.o. male with medical history significant of OA, DDD cervical and lumbar spine, COPD, HTN, GERD who presents for evaluation of black tarry stools with shortness of breath with exertion and fatigue over the last few days.  He was at his cardiothoracic surgeon having follow-up appointment for his previous lobectomy.  He underwent a CT scan of his chest.  While he was at the appointment he mentioned having black tarry stools for the last 3 to 4 days with increased fatigue and getting short of breath with exertion.  He was sent to the emergency room for further evaluation.  He has been taking Mobic daily as well as prednisone which he decreased to 10 mg from 20 mg 3 days ago.  When he arrived in the emergency room he had an episode of nausea and vomited bright red blood 1 time.  He was given Zofran and nausea has resolved.  He has been having black tarry stool for the last 4 to 5 days which is Hemoccult positive in the emergency room.  He has no prior history of GI bleeding or ulcers.  He had a colonoscopy a year ago and had 1 benign polyp removed at that time he states.  Nuys any abdominal pain.  He has not had fever, chills, urinary symptoms, chest pain.  Denies any episodes of syncope.  Has a history of smoking but quit over 20 years ago.  In the emergency room he has been tachycardic and was given IV fluids.  Tachycardia improved with IV fluid hydration.  He was found to be significantly anemic with hemoglobin of 5.7.  Packed red blood cell transfusion has been ordered and started in ER.  ER physician discussed with gastroenterology, Dr. Fuller Plan of [facility], who will see patient in the morning and  perform upper endoscopy.  If patient becomes unstable hemodynamically overnight or has recurrent bright red emesis GI will come in tonight and perform EGD emergently.  Review of Systems: As mentioned in the history of present illness. All other systems reviewed and are negative. Past Medical History:  Diagnosis Date   Allergic rhinitis    Atypical chest pain 12/01/2014   Back pain 01/12/2013   COPD (chronic obstructive pulmonary disease) (HCC)    FeV1 64%-2007   Dyspnea    Emphysema    GERD (gastroesophageal reflux disease)    History of lung abscess    bronchiectasis with RMLandRLL ersection -1996- Dr Arlyce Dice   Hordeolum externum (stye) 04/23/2015   Right eye   Hypertension    Impaired vision    glasses   Osteoarthritis    hands and knees   Sinusitis, acute 04/23/2015   Past Surgical History:  Procedure Laterality Date   COLONOSCOPY  11/20/2010   Dr.Stark   HERNIA REPAIR  10/03/2009   left knee     lower back surgery  09/02/2008   06/2009   LUNG SURGERY     RML andRUL removed due to bleeding and bronchiectasis and lung abcess   NECK SURGERY  05/03/2008   right foot surgery     VIDEO BRONCHOSCOPY N/A 07/12/2020   Procedure: VIDEO BRONCHOSCOPY;  Surgeon: [name], MD;  Location: [facility] OR;  Service: Thoracic;  Laterality: N/A;   VIDEO BRONCHOSCOPY WITH ENDOBRONCHIAL NAVIGATION N/A 07/12/2020   Procedure: VIDEO BRONCHOSCOPY WITH ENDOBRONCHIAL NAVIGATION;  Surgeon: [name], MD;  Location: [place];  Service: Thoracic;  Laterality: N/A;   Social History:  reports that he quit smoking about 21 years ago. His smoking use included cigarettes. He has a 57.00 pack-year smoking history. He has been exposed to tobacco smoke. He has never used smokeless tobacco. He reports current alcohol use of about 1.0 standard drink per week. He reports that he does not use drugs.  Allergies  Allergen Reactions   Losartan Other (See Comments)    weakness    Family History   Problem Relation Age of Onset   Heart failure Mother        age 14   Prostate cancer Father        father died prostate ca   Obstructive Sleep Apnea Brother    Obesity Brother    Colon cancer Paternal Grandmother    Healthy Son    Coronary artery disease Other        1st degree relative<60   Stroke Other        1st degree relative<50   Esophageal cancer Neg Hx    Stomach cancer Neg Hx     Prior to Admission medications   Medication Sig Start Date End Date Taking? Authorizing Provider  acetaminophen (TYLENOL) 650 MG CR tablet Take 1,300 mg by mouth every 8 (eight) hours as needed for pain.    [provider]  albuterol (VENTOLIN HFA) 108 (90 Base) MCG/ACT inhaler INHALE 2 PUFFS INTO THE LUNGS EVERY 6 HOURS AS NEEDED FOR WHEEZING OR SHORTNESS OF BREATH. 09/03/21   Mosie Lukes, MD  amLODipine (NORVASC) 5 MG tablet TAKE 1 TABLET EVERY DAY 04/09/21   Mosie Lukes, MD  benzonatate (TESSALON) 100 MG capsule Take 1 capsule (100 mg total) by mouth 3 (three) times daily as needed for cough. 10/30/21   [name], MD  calcium elemental as carbonate (BARIATRIC TUMS ULTRA) 400 MG chewable tablet Chew 1,000 mg by mouth 2 (two) times daily.     [provider]  Capsaicin-Menthol (SALONPAS GEL EX) Apply 1 application topically daily as needed (arthritis pain).    [provider]  cetirizine (ZYRTEC) 10 MG tablet Take 10 mg by mouth daily.    [provider]  diclofenac Sodium (VOLTAREN) 1 % GEL Apply 2 g topically 3 (three) times daily as needed (pain).    [provider]  doxycycline (VIBRAMYCIN) 100 MG capsule Take 1 capsule (100 mg total) by mouth 2 (two) times daily. 09/06/21   Copland, Gay Filler, MD  fluticasone (FLONASE) 50 MCG/ACT nasal spray Place 2 sprays into both nostrils daily. 04/09/21   Mosie Lukes, MD  Glucosamine-Chondroitin 750-600 MG TABS Take 1 tablet by mouth 2 (two) times daily.    [provider]  Guaifenesin  1200 MG TB12 Take 600 mg by mouth 2 (two) times daily.    [provider]  imiquimod (ALDARA) 5 % cream Apply topically 3 (three) times a week. 12/24/19   Mosie Lukes, MD  loratadine (CLARITIN) 10 MG tablet Take 10 mg by mouth daily.    [provider]  meloxicam (MOBIC) 15 MG tablet TAKE 1 TABLET EVERY DAY AS NEEDED FOR PAIN 09/03/21   Mosie Lukes, MD  Menthol [facility] COUGH DROPS MT) Use as directed 1 lozenge in the  mouth or throat 4 (four) times daily as needed (cough).     [provider]  Menthol, Topical Analgesic, (BLUE-EMU MAXIMUM STRENGTH EX) Apply 1 application topically daily as needed (arthritis pain).     [provider]  montelukast (SINGULAIR) 10 MG tablet TAKE 1 TABLET EVERY DAY 10/09/20   Mosie Lukes, MD  Multiple Vitamin (MULTIVITAMIN WITH MINERALS) TABS tablet Take 1 tablet by mouth daily.     [provider]  predniSONE (DELTASONE) 20 MG tablet Take 1 tablet (20 mg total) by mouth daily with breakfast. Patient taking differently: Take 10 mg by mouth daily with breakfast. 08/16/21   Mosie Lukes, MD  TRELEGY ELLIPTA 100-62.5-25 MCG/ACT AEPB INHALE 1 PUFF INTO THE LUNGS DAILY 07/12/21   Mosie Lukes, MD    Physical Exam: Vitals:   10/30/21 1752 10/30/21 1833 10/30/21 1852 10/30/21 1915  BP: 136/65 (!) 141/70 (!) 144/49 (!) 150/78  Pulse: (!) 57 (!) 109 (!) 106 (!) 107  Resp: 18 (!) 23 17 (!) 23  Temp:  98.3 F (36.8 C) 98.2 F (36.8 C)   TempSrc:  Oral Oral   SpO2: 99% 99% 99% 94%  Weight:      Height:       General: WDWN, Alert and oriented x3.  Eyes: EOMI, PERRL, conjunctivae pale.  Sclera nonicteric HENT:  [place]/AT, external ears normal.  Nares patent without epistasis.  Mucous membranes are moist. Posterior pharynx clear of any exudate or lesions. Neck: Soft, normal range of motion, supple, no masses, Trachea midline Respiratory: Diminished breath sounds right more than left.  Diffuse scattered  rales. no  wheezing, no crackles. Normal respiratory effort..  Cardiovascular: Regular rhythm, tachycardia. no murmurs / rubs / gallops. No extremity edema. Abdomen: Soft, no tenderness, nondistended, no rebound or guarding.  No masses palpated. Bowel sounds normoactive Musculoskeletal: FROM. no cyanosis. No joint deformity upper and lower extremities. Normal muscle tone.  Skin: Warm, dry, intact no rashes, lesions, ulcers. No induration Neurologic: CN 2-12 grossly intact.  Normal speech.  Sensation intact,  Strength 5/5 in all extremities.   Psychiatric: Normal judgment and insight.  Normal mood.    Data Reviewed: Lab Work: BC 10,200 hemoglobin 5.7 hematocrit 17.7 platelets 266,000 iron level 20 TIBC 342 ferritin 32 vitamin B12 378 sodium 136 potassium 3.9 chloride 105 bicarb 22 creatinine 1.05 BUN 50 glucose 144 alkaline phosphatase 33 albumin 2.6 AST 19 ALT 15 bilirubin 0.6 INR 1.1   COVID-negative influenza A and B negative.   Stool is Hemoccult positive  Chest x-ray-normal cardiac silhouette.  Loss of volume in the right lung.  Patient is status post lobectomy on the right.  Stable right apical thickening and scarring in the right lung base that is unchanged from previous chest x-rays.  No infiltrate or consolidation.  No pneumothorax.  No pleural effusion.  Bilateral glenohumeral osteoarthritis.  EKG shows sinus tachycardia with occasional PVCs.  No acute ST elevation or depression.  QTc 466  Assessment and Plan: * Acute upper GI bleed- (present on admission) [name] is admitted to Progressive care unit.  Started on Protonix infusion.  IVF hydration Transfuse PRBC Hold NSAIDs and Prednsone GI consulted and will see in am to perform EGD. If pt has further hemoptysis tonight or becomes unstable GI will do EGD emergently. Case discussed with Dr Fuller Plan by ER physician  Anemia due to acute blood loss Two Units PRBC transfused.  Monitor Hgb/Hct level  Benign essential HTN- (present on  admission) Pt  is NPO until GI evaluation in am.  Will provide IV metoprolol is needed overnight for SBP >170  COPD GOLD II D- (present on admission) Continue Trelogy inhaler. Duoneb as needed.  DDD (degenerative disc disease), lumbar Hold Mobic and prednisone with acute GI bleeding.  Provide Dilaudid for pain control overnight.   Osteoarthritis Pain control provided.  Advance Care Planning:   Code Status:  Full Code.  SCDs for DVT prophylaxis. No anticoagulation with GI bleed  Consults: Gastroenterology, [facility], Dr. Fuller Plan  Family Communication: Diagnosis and plan discussed with patient.  Patient verbalized understanding and agrees with plan.  Further recommendations to follow as clinically indicated  Author: Eben Burow, MD 10/30/2021 8:25 PM  For on call review [email or web address].

## 2021-10-30 NOTE — Assessment & Plan Note (Signed)
Pt is NPO until GI evaluation in am.  Will provide IV metoprolol is needed overnight for SBP >170

## 2021-10-30 NOTE — Telephone Encounter (Signed)
Pt states he's having gastrointestinal bleedings and dark stools  Transferred to triage regarding symps

## 2021-10-30 NOTE — Assessment & Plan Note (Signed)
Two Units PRBC transfused.  Monitor Hgb/Hct level

## 2021-10-30 NOTE — Assessment & Plan Note (Signed)
Pain control provided.

## 2021-10-31 ENCOUNTER — Inpatient Hospital Stay (HOSPITAL_COMMUNITY): Payer: Medicare HMO | Admitting: Certified Registered Nurse Anesthetist

## 2021-10-31 ENCOUNTER — Encounter (HOSPITAL_COMMUNITY)
Admission: EM | Disposition: A | Discharge status: Home / Self Care | Payer: Self-pay | Source: Home / Self Care | Attending: Internal Medicine

## 2021-10-31 ENCOUNTER — Encounter (HOSPITAL_COMMUNITY): Payer: Self-pay | Admitting: Family Medicine

## 2021-10-31 ENCOUNTER — Telehealth: Payer: Self-pay | Admitting: Internal Medicine

## 2021-10-31 DIAGNOSIS — K269 Duodenal ulcer, unspecified as acute or chronic, without hemorrhage or perforation: Secondary | ICD-10-CM

## 2021-10-31 DIAGNOSIS — I10 Essential (primary) hypertension: Secondary | ICD-10-CM

## 2021-10-31 DIAGNOSIS — K264 Chronic or unspecified duodenal ulcer with hemorrhage: Secondary | ICD-10-CM

## 2021-10-31 DIAGNOSIS — D62 Acute posthemorrhagic anemia: Secondary | ICD-10-CM

## 2021-10-31 DIAGNOSIS — K449 Diaphragmatic hernia without obstruction or gangrene: Secondary | ICD-10-CM

## 2021-10-31 DIAGNOSIS — J449 Chronic obstructive pulmonary disease, unspecified: Secondary | ICD-10-CM

## 2021-10-31 DIAGNOSIS — K260 Acute duodenal ulcer with hemorrhage: Secondary | ICD-10-CM

## 2021-10-31 HISTORY — PX: HEMOSTASIS CONTROL: SHX6838

## 2021-10-31 HISTORY — PX: ESOPHAGOGASTRODUODENOSCOPY (EGD) WITH PROPOFOL: SHX5813

## 2021-10-31 HISTORY — PX: HOT HEMOSTASIS: SHX5433

## 2021-10-31 HISTORY — PX: SCLEROTHERAPY: SHX6841

## 2021-10-31 LAB — CBC
HCT: 23.2 % — ABNORMAL LOW (ref 39.0–52.0)
HCT: 24.1 % — ABNORMAL LOW (ref 39.0–52.0)
Hemoglobin: 7.8 g/dL — ABNORMAL LOW (ref 13.0–17.0)
Hemoglobin: 8.1 g/dL — ABNORMAL LOW (ref 13.0–17.0)
MCH: 32.4 pg (ref 26.0–34.0)
MCH: 32.3 pg (ref 26.0–34.0)
MCHC: 33.6 g/dL (ref 30.0–36.0)
MCHC: 33.6 g/dL (ref 30.0–36.0)
MCV: 96.3 fL (ref 80.0–100.0)
MCV: 96 fL (ref 80.0–100.0)
Platelets: 211 10*3/uL (ref 150–400)
Platelets: 239 10*3/uL (ref 150–400)
RBC: 2.41 MIL/uL — ABNORMAL LOW (ref 4.22–5.81)
RBC: 2.51 MIL/uL — ABNORMAL LOW (ref 4.22–5.81)
RDW: 15.2 % (ref 11.5–15.5)
RDW: 15.3 % (ref 11.5–15.5)
WBC: 10 10*3/uL (ref 4.0–10.5)
WBC: 7.9 10*3/uL (ref 4.0–10.5)
nRBC: 0 % (ref 0.0–0.2)
nRBC: 0 % (ref 0.0–0.2)

## 2021-10-31 LAB — BASIC METABOLIC PANEL
Anion gap: 8 (ref 5–15)
BUN: 35 mg/dL — ABNORMAL HIGH (ref 8–23)
CO2: 23 mmol/L (ref 22–32)
Calcium: 7.9 mg/dL — ABNORMAL LOW (ref 8.9–10.3)
Chloride: 107 mmol/L (ref 98–111)
Creatinine, Ser: 0.88 mg/dL (ref 0.61–1.24)
GFR, Estimated: >60 mL/min (ref >60)
Glucose, Bld: 88 mg/dL (ref 70–99)
Potassium: 3.9 mmol/L (ref 3.5–5.1)
Sodium: 138 mmol/L (ref 135–145)

## 2021-10-31 LAB — RETICULOCYTES
Immature Retic Fract: 39.3 % — ABNORMAL HIGH (ref 2.3–15.9)
RBC.: 2.43 MIL/uL — ABNORMAL LOW (ref 4.22–5.81)
Retic Count, Absolute: 102.3 10*3/uL (ref 19.0–186.0)
Retic Ct Pct: 4.2 % — ABNORMAL HIGH (ref 0.4–3.1)

## 2021-10-31 LAB — PREPARE RBC (CROSSMATCH)

## 2021-10-31 LAB — MAGNESIUM: Magnesium: 1.8 mg/dL (ref 1.7–2.4)

## 2021-10-31 SURGERY — ESOPHAGOGASTRODUODENOSCOPY (EGD) WITH PROPOFOL
Anesthesia: Monitor Anesthesia Care

## 2021-10-31 MED ORDER — SUCCINYLCHOLINE CHLORIDE 200 MG/10ML IV SOSY
PREFILLED_SYRINGE | INTRAVENOUS | Status: DC | PRN
  Administered 2021-10-31: 100 mg via INTRAVENOUS

## 2021-10-31 MED ORDER — SODIUM CHLORIDE 0.9% IV SOLUTION: Freq: Once | INTRAVENOUS | Status: AC

## 2021-10-31 MED ORDER — AMISULPRIDE (ANTIEMETIC) 5 MG/2ML IV SOLN
10.0000 mg | Freq: Once | INTRAVENOUS | Status: AC
  Administered 2021-10-31: 10 mg via INTRAVENOUS

## 2021-10-31 MED ORDER — DEXAMETHASONE SODIUM PHOSPHATE 10 MG/ML IJ SOLN
INTRAMUSCULAR | Status: DC | PRN
  Administered 2021-10-31: 10 mg via INTRAVENOUS

## 2021-10-31 MED ORDER — EPINEPHRINE 1 MG/10ML IJ SOSY
PREFILLED_SYRINGE | INTRAMUSCULAR | Status: AC
  Filled 2021-10-31: qty 10

## 2021-10-31 MED ORDER — SODIUM CHLORIDE 0.9% IV SOLUTION: Freq: Once | INTRAVENOUS | Status: DC

## 2021-10-31 MED ORDER — LIDOCAINE 2% (20 MG/ML) 5 ML SYRINGE
INTRAMUSCULAR | Status: DC | PRN
Start: 2021-10-31 — End: 2021-10-31
  Administered 2021-10-31: 60 mg via INTRAVENOUS
  Administered 2021-10-31: 40 mg via INTRAVENOUS

## 2021-10-31 MED ORDER — AMISULPRIDE (ANTIEMETIC) 5 MG/2ML IV SOLN
INTRAVENOUS | Status: AC
  Filled 2021-10-31: qty 4

## 2021-10-31 MED ORDER — PROPOFOL 10 MG/ML IV BOLUS
INTRAVENOUS | Status: DC | PRN
  Administered 2021-10-31: 30 mg via INTRAVENOUS
  Administered 2021-10-31: 100 mg via INTRAVENOUS
  Administered 2021-10-31: 30 mg via INTRAVENOUS

## 2021-10-31 MED ORDER — ONDANSETRON HCL 4 MG/2ML IJ SOLN
INTRAMUSCULAR | Status: DC | PRN
  Administered 2021-10-31: 4 mg via INTRAVENOUS

## 2021-10-31 MED ORDER — PHENYLEPHRINE 40 MCG/ML (10ML) SYRINGE FOR IV PUSH (FOR BLOOD PRESSURE SUPPORT)
PREFILLED_SYRINGE | INTRAVENOUS | Status: DC | PRN
  Administered 2021-10-31: 120 ug via INTRAVENOUS

## 2021-10-31 MED ORDER — PROPOFOL 500 MG/50ML IV EMUL
INTRAVENOUS | Status: DC | PRN
  Administered 2021-10-31: 125 ug/kg/min via INTRAVENOUS

## 2021-10-31 MED ORDER — SODIUM CHLORIDE (PF) 0.9 % IJ SOLN
PREFILLED_SYRINGE | INTRAMUSCULAR | Status: DC | PRN
  Administered 2021-10-31: 5.5 mL

## 2021-10-31 SURGICAL SUPPLY — 15 items

## 2021-10-31 NOTE — Anesthesia Preprocedure Evaluation (Addendum)
Anesthesia Evaluation  ?Patient identified by MRN, date of birth, ID band ?Patient awake ? ? ? ?Reviewed: ?Allergy & Precautions, NPO status , Patient's Chart, lab work & pertinent test results ? ?History of Anesthesia Complications ?Negative for: history of anesthetic complications ? ?Airway ?Mallampati: II ? ?TM Distance: >3 FB ?Neck ROM: Full ? ? ? Dental ?no notable dental hx. ? ?  ?Pulmonary ?COPD, former smoker,  ?  ?Pulmonary exam normal ? ? ? ? ? ? ? Cardiovascular ?hypertension, Pt. on medications ?Normal cardiovascular exam ? ? ?  ?Neuro/Psych ?negative neurological ROS ? negative psych ROS  ? GI/Hepatic ?Neg liver ROS, GERD  Medicated and Controlled,GIB ?  ?Endo/Other  ?negative endocrine ROS ? Renal/GU ?negative Renal ROS  ?negative genitourinary ?  ?Musculoskeletal ? ?(+) Arthritis ,  ? Abdominal ?  ?Peds ? Hematology ?negative hematology ROS ?(+) Blood dyscrasia, anemia , Hgb 7.8   ?Anesthesia Other Findings ?Day of surgery medications reviewed with patient. ? Reproductive/Obstetrics ?negative OB ROS ? ?  ? ? ? ? ? ? ? ? ? ? ? ? ? ?  ?  ? ? ? ? ? ? ? ?Anesthesia Physical ?Anesthesia Plan ? ?ASA: 3 ? ?Anesthesia Plan: MAC  ? ?Post-op Pain Management: Minimal or no pain anticipated  ? ?Induction:  ? ?PONV Risk Score and Plan: 1 and Treatment may vary due to age or medical condition and Propofol infusion ? ?Airway Management Planned: Natural Airway and Nasal Cannula ? ?Additional Equipment: None ? ?Intra-op Plan:  ? ?Post-operative Plan:  ? ?Informed Consent: I have reviewed the patients History and Physical, chart, labs and discussed the procedure including the risks, benefits and alternatives for the proposed anesthesia with the patient or authorized representative who has indicated his/her understanding and acceptance.  ? ? ? ? ? ?Plan Discussed with: CRNA ? ?Anesthesia Plan Comments:   ? ? ? ? ? ?Anesthesia Quick Evaluation ? ?

## 2021-10-31 NOTE — Op Note (Addendum)
[facility] ?Patient Name: [name] ?Procedure Date : 10/31/2021 ?MRN: [id number] ?Attending MD: Gatha Mayer , MD ?Date of Birth: 07-31-1946 ?CSN: [id number] ?Age: 76 ?Admit Type: Emergency Department ?Procedure:                Upper GI endoscopy ?Indications:              Hematemesis, Melena, Suspected upper  ?                          gastrointestinal bleeding NSAID and prednisone use ?Providers:                Gatha Mayer, MD, Allayne Gitelman, RN, Charlean Merl  ?                          Purcell Nails, Technician, Tyna Jaksch Technician ?Referring MD:              ?Medicines:                General Anesthesia ?Complications:            No immediate complications. ?Estimated Blood Loss:     Estimated blood loss: 50 mL. ?Procedure:                Pre-Anesthesia Assessment: ?                          - Prior to the procedure, a History and Physical  ?                          was performed, and patient medications and  ?                          allergies were reviewed. The patient's tolerance of  ?                          previous anesthesia was also reviewed. The risks  ?                          and benefits of the procedure and the sedation  ?                          options and risks were discussed with the patient.  ?                          All questions were answered, and informed consent  ?                          was obtained. Prior Anticoagulants: The patient has  ?                          taken no previous anticoagulant or antiplatelet  ?                          agents. ASA Grade Assessment: III - A patient with  ?  severe systemic disease. After reviewing the risks  ?                          and benefits, the patient was deemed in  ?                          satisfactory condition to undergo the procedure. ?                          After obtaining informed consent, the endoscope was  ?                          passed under direct vision. Throughout the  ?                           procedure, the patient's blood pressure, pulse, and  ?                          oxygen saturations were monitored continuously. The  ?                          GIF-H190 ([id number]) Olympus endoscope was introduced  ?                          through the mouth, and advanced to the second part  ?                          of duodenum. The upper GI endoscopy was somewhat  ?                          difficult due to inadequate response to sedation  ?                          and position. Successful completion of the  ?                          procedure was aided by general anesthesia via  ?                          intubation. The patient tolerated the procedure  ?                          fairly well. ?Scope In: ?Scope Out: ?Findings: ?     One non-bleeding cratered duodenal ulcer with a visible vessel was found  ?     in the second portion of the duodenum. The lesion was 20 mm in largest  ?     dimension. Area was successfully injected with 5.5cc of a 1:10,000  ?     solution of epinephrine for hemostasis. Estimated blood loss: none.  ?     Coagulation for hemostasis using bipolar probe was unsuccessful. To stop  ?     active bleeding, hemostatic spray was deployed. Several sprays were  ?     applied. Suspect but cannot confirm cessation of bleeding. ?     One non-bleeding cratered  duodenal ulcer with no stigmata of bleeding  ?     was found in the duodenal bulb. The lesion was 10 mm in largest  ?     dimension. ?     A small hiatal hernia was present. ?     The exam was otherwise without abnormality. ?     The cardia and gastric fundus were normal on retroflexion. ?Impression:               - Non-bleeding duodenal ulcer with a visible  ?                          vessel. Injected. Treatment not successful. Treated  ?                          with bipolar cautery. hemostatic spray applied.  ?                          Bleeding stopped? ?                          - Non-bleeding duodenal ulcer with no  stigmata of  ?                          bleeding. ?                          - Small hiatal hernia. ?                          - The examination was otherwise normal. ?                          - No specimens collected. ?Recommendation:           - NEEDS STEPDOWN CARE ?                          AT HIGH RISK OF RECURRENT BLEEDING AND WOULD NEED  ?                          IR EVAL AND TREATMENT IF THAT OCCURS ?                          GIVING 1 MORE U PRBC ?                          CONTINUE NPO AND PPI INFUSION ?                          BROTHER UPDATED, MESSAGE LEFT FOR WIFE ?                          NO NSAID, PREDNISONE AT THIS TIME - UNLESS NEEDS  ?                          STEROID TAPER ?Procedure Code(s):        --- Professional --- ?  43255, Esophagogastroduodenoscopy, flexible,  ?                          transoral; with control of bleeding, any method ?Diagnosis Code(s):        --- Professional --- ?                          K26.4, Chronic or unspecified duodenal ulcer with  ?                          hemorrhage ?                          K26.9, Duodenal ulcer, unspecified as acute or  ?                          chronic, without hemorrhage or perforation ?                          K44.9, Diaphragmatic hernia without obstruction or  ?                          gangrene ?                          K92.0, Hematemesis ?                          K92.1, Melena (includes Hematochezia) ?CPT copyright 2019 American [employer] Association. All rights reserved. ?The codes documented in this report are preliminary and upon coder review may  ?be revised to meet current compliance requirements. ?Gatha Mayer, MD ?10/31/2021 1:27:32 PM ?This report has been signed electronically. ?Number of Addenda: 0 ?

## 2021-10-31 NOTE — Progress Notes (Signed)
Having some back pain. Tolerating so far. Plan to observe - likely from EPI injection into duodenal ulcer. ?Abd soft and NT ?

## 2021-10-31 NOTE — Progress Notes (Signed)
PROGRESS NOTE  KNIGHT OELKERS RSW:[id number] DOB: 08-Apr-1946 DOA: 10/30/2021 PCP: Mosie Lukes, MD   LOS: 1 day   Brief Narrative / Interim history: 76 year old male with history of osteoarthritis, DDD, COPD, history of right upper limb middle bilobectomy for bronchiectasis and hemoptysis in 1996, tobacco use, who comes into the hospital with black tarry stools, shortness of breath, fatigue.  He also reports vomiting bright red blood.  He was found to have anemia, was given blood transfusions and GI was consulted.  Subjective / 24h Interval events: Doing well, no abdominal pain, no nausea or vomiting  Assesement and Plan: Principal Problem:   Acute upper GI bleed Active Problems:   Benign essential HTN   COPD GOLD II D   Anemia due to acute blood loss   DDD (degenerative disc disease), lumbar   Osteoarthritis   Assessment and Plan: Principal problem Acute upper GI bleed -GI consulted, appreciate input.  Hemoglobin was 5.7 on admission, status post 2 units of packed red blood cells, hemoglobin improved appropriately to 7.8.  Continue Protonix, plan for EGD today.  Active problems Anemia due to acute blood loss -due to #1, status post 2 units of packed red blood cells.  Continue to closely monitor hemoglobin  Benign essential HTN-continue as needed's   COPD GOLD II D-Continue inhalers.  Respiratory status stable.  History of bilobectomy by cardiac surgery noted   DDD (degenerative disc disease), lumbar -Hold Mobic and prednisone with acute GI bleeding.    Osteoarthritis -Pain control provided.   Scheduled Meds:  fluticasone furoate-vilanterol  1 puff Inhalation Daily   And   umeclidinium bromide  1 puff Inhalation Daily   [START ON 11/03/2021] pantoprazole  40 mg Intravenous Q12H   Continuous Infusions:  lactated ringers 100 mL/hr at 10/31/21 0938   pantoprazole 8 mg/hr (10/31/21 0622)   PRN Meds:.acetaminophen **OR** acetaminophen, albuterol, HYDROmorphone (DILAUDID)  injection, ipratropium-albuterol, metoprolol tartrate, ondansetron **OR** ondansetron (ZOFRAN) IV  Diet Orders (From admission, onward)     Start     Ordered   10/30/21 2028  Diet NPO time specified  Diet effective now        10/30/21 2027            DVT prophylaxis: SCDs Start: 10/30/21 2028   Lab Results  Component Value Date   PLT 211 10/31/2021      Code Status: Full Code  Family Communication: no family at bedside  Status is: Inpatient  Remains inpatient appropriate because: Severity of illness  Level of care: Progressive  Consultants:  GI  Procedures:  none  Microbiology  none  Antimicrobials: none    Objective: Vitals:   10/31/21 0600 10/31/21 0730 10/31/21 0800 10/31/21 0830  BP: 136/78 (!) 143/75 (!) 134/93 (!) 167/61  Pulse: (!) 101 (!) 103 (!) 103 (!) 110  Resp: 16 14 13 16   Temp:      TempSrc:      SpO2: 95% 96% 95% 99%  Weight:      Height:       No intake or output data in the 24 hours ending 10/31/21 1027 Wt Readings from Last 3 Encounters:  10/30/21 64.9 kg  10/30/21 64.9 kg  10/16/21 64.9 kg    Examination:  Constitutional: NAD Eyes: no scleral icterus ENMT: Mucous membranes are moist.  Neck: normal, supple Respiratory: clear to auscultation bilaterally, no wheezing, no crackles. Normal respiratory effort.  Cardiovascular: Regular rate and rhythm, no murmurs / rubs / gallops. No LE edema.  Abdomen: non distended, no tenderness. Bowel sounds positive.  Musculoskeletal: no clubbing / cyanosis.  Skin: no rashes Neurologic: non focal    Data Reviewed: I have independently reviewed following labs and imaging studies   CBC Recent Labs  Lab 10/30/21 1642 10/30/21 1812 10/31/21 0259  WBC 10.2  --  10.0  HGB 5.7* 5.8* 7.8*  HCT 17.7* 17.0* 23.2*  PLT 266  --  211  MCV 99.4  --  96.3  MCH 32.0  --  32.4  MCHC 32.2  --  33.6  RDW 15.5  --  15.2    Recent Labs  Lab 10/30/21 1642 10/30/21 1812 10/30/21 1826  10/31/21 0259  NA 136 136  --  138  K 3.9 3.8  --  3.9  CL 105 106  --  107  CO2 22  --   --  23  GLUCOSE 144* 171*  --  88  BUN 50* 45*  --  35*  CREATININE 1.05 1.10  --  0.88  CALCIUM 9.0  --   --  7.9*  AST 19  --   --   --   ALT 15  --   --   --   ALKPHOS 33*  --   --   --   BILITOT 0.6  --   --   --   ALBUMIN 2.6*  --   --   --   INR  --   --  1.1  --     ------------------------------------------------------------------------------------------------------------------ No results for input(s): CHOL, HDL, LDLCALC, TRIG, CHOLHDL, LDLDIRECT in the last 72 hours.  Lab Results  Component Value Date   HGBA1C 5.7 08/16/2021   ------------------------------------------------------------------------------------------------------------------ No results for input(s): TSH, T4TOTAL, T3FREE, THYROIDAB in the last 72 hours.  Invalid input(s): FREET3  Cardiac Enzymes No results for input(s): CKMB, TROPONINI, MYOGLOBIN in the last 168 hours.  Invalid input(s): CK ------------------------------------------------------------------------------------------------------------------ No results found for: BNP  CBG: No results for input(s): GLUCAP in the last 168 hours.  Recent Results (from the past 240 hour(s))  Resp Panel by RT-PCR (Flu A&B, Covid) Nasopharyngeal Swab     Status: None   Collection Time: 10/30/21  6:04 PM   Specimen: Nasopharyngeal Swab; Nasopharyngeal(NP) swabs in vial transport medium  Result Value Ref Range Status   SARS Coronavirus 2 by RT PCR NEGATIVE NEGATIVE Final    Comment: (NOTE) SARS-CoV-2 target nucleic acids are NOT DETECTED.  The SARS-CoV-2 RNA is generally detectable in upper respiratory specimens during the acute phase of infection. The lowest concentration of SARS-CoV-2 viral copies this assay can detect is 138 copies/mL. A negative result does not preclude SARS-Cov-2 infection and should not be used as the sole basis for treatment or other patient  management decisions. A negative result may occur with  improper specimen collection/handling, submission of specimen other than nasopharyngeal swab, presence of viral mutation(s) within the areas targeted by this assay, and inadequate number of viral copies(<138 copies/mL). A negative result must be combined with clinical observations, patient history, and epidemiological information. The expected result is Negative.  Fact Sheet for Patients:  EntrepreneurPulse.com.au  Fact Sheet for Healthcare Providers:  IncredibleEmployment.be  This test is no t yet approved or cleared by the [place] FDA and  has been authorized for detection and/or diagnosis of SARS-CoV-2 by FDA under an Emergency Use Authorization (EUA). This EUA will remain  in effect (meaning this test can be used) for the duration of the COVID-19 declaration under Section 564(b)(1) of the  Act, 21 U.S.C.section 360bbb-3(b)(1), unless the authorization is terminated  or revoked sooner.       Influenza A by PCR NEGATIVE NEGATIVE Final   Influenza B by PCR NEGATIVE NEGATIVE Final    Comment: (NOTE) The Xpert Xpress SARS-CoV-2/FLU/RSV plus assay is intended as an aid in the diagnosis of influenza from Nasopharyngeal swab specimens and should not be used as a sole basis for treatment. Nasal washings and aspirates are unacceptable for Xpert Xpress SARS-CoV-2/FLU/RSV testing.  Fact Sheet for Patients: EntrepreneurPulse.com.au  Fact Sheet for Healthcare Providers: IncredibleEmployment.be  This test is not yet approved or cleared by the [place] FDA and has been authorized for detection and/or diagnosis of SARS-CoV-2 by FDA under an Emergency Use Authorization (EUA). This EUA will remain in effect (meaning this test can be used) for the duration of the COVID-19 declaration under Section 564(b)(1) of the Act, 21 U.S.C. section 360bbb-3(b)(1),  unless the authorization is terminated or revoked.  Performed at [facility], [place] [address], [place], [place] [address]      Radiology Studies: CT Chest Wo Contrast  Result Date: 10/30/2021 CLINICAL DATA:  Follow-up lung nodule. History of right upper and right middle lobectomies. EXAM: CT CHEST WITHOUT CONTRAST TECHNIQUE: Multidetector CT imaging of the chest was performed following the standard protocol without IV contrast. RADIATION DOSE REDUCTION: This exam was performed according to the departmental dose-optimization program which includes automated exposure control, adjustment of the mA and/or kV according to patient size and/or use of iterative reconstruction technique. COMPARISON:  05/01/2021 FINDINGS: Cardiovascular: The heart size appears within normal limits. Aortic atherosclerosis with coronary artery calcifications. No pericardial effusion. Mediastinum/Nodes: No enlarged mediastinal or axillary lymph nodes. Thyroid gland, trachea, and esophagus demonstrate no significant findings. Lungs/Pleura: Centrilobular emphysema. Status post right upper and right middle lobectomies. Pleural thickening overlying the apical portion of the right lung is again noted and appears similar to the previous exam, image 38/2. The cavitary component of the pleural thickening has resolved when compared with the previous exam. Similar appearance of pleuroparenchymal scarring within the posterior and lateral right lung base. Stable chronic appearing subpleural collections of gas within the posterior right lung base, image 98/2. The appearance is unchanged compared with 08/29/2020. Upper Abdomen: Aortic atherosclerosis. No acute abnormality within the imaged portions of the upper abdomen. Musculoskeletal: Postop change from ACDF. Previous PLIF noted within the lumbar spine. Degenerative disc disease identified within the lower thoracic spine. No acute or suspicious osseous findings. IMPRESSION: 1. Status post  right upper and right middle lobectomies. 2. Stable appearance of pleural thickening overlying the apical portion of the right lung. The cavitary component within this area has resolved in the interval. 3. Stable appearance of pleuroparenchymal scarring within the right lung base with stable chronic areas of loculated pleural gas overlying the posterior left base. 4. Aortic Atherosclerosis (ICD10-I70.0) and Emphysema (ICD10-J43.9). Coronary artery calcifications. Electronically Signed   By: Kerby Moors M.D.   On: 10/30/2021 14:50   DG Chest Portable 1 View  Result Date: 10/30/2021 CLINICAL DATA:  Shortness of breath. Hematemesis. History of right lung partial resection. EXAM: PORTABLE CHEST 1 VIEW COMPARISON:  Right lung radiographs 09/06/2021 FINDINGS: Cardiac silhouette and mediastinal contours are unchanged and within normal limits. There is again moderate right lung volume loss. Stable right apical thickening. Scarring within the right lung base unchanged. No new focal airspace opacity. No definite pleural effusion. No pneumothorax. ACDF hardware again overlies the cervical spine. High-riding humeral heads bilaterally suggesting full-thickness superior  rotator cuff tears. Severe right greater than left glenohumeral osteoarthritis. IMPRESSION:: IMPRESSION: 1. No significant change in chronic right lung volume loss and scarring. 2. No acute lung process. Electronically Signed   By: Yvonne Kendall M.D.   On: 10/30/2021 18:39     Marzetta Board, MD, PhD [facility]  Between 7 am - 7 pm I am available, please contact me via Amion (for emergencies) or Securechat (non urgent messages)  Between 7 pm - 7 am I am not available, please contact night coverage MD/APP via Amion

## 2021-10-31 NOTE — Consult Note (Addendum)
[facility] Consult: 8:19 AM 10/31/2021  LOS: 1 day    Referring Provider: Dr Cruzita Lederer  Primary Care Physician:  Mosie Lukes, MD Primary Gastroenterologist:  Dr. Fuller Plan     Reason for Consultation:  tarry stools, dyspnea, Hgb 5.7.     HPI: [name] is a 76 y.o. male.  PMH arthritis, degenerative disc disease.  COPD, emphysema.  Hypertension.  1996 right mid and upper lobe lobectomy to address abscess, bronchiectasis, bleeding.  Bronchoscopy 07/2021 to address mopped assist w cystic, changes in the right chest, study nondiagnostic but aspirate grew staph and Acinetobacter.     11/2010 colonoscopy.  Average risk screening study.  5 mm sessile polyp removed from ascending colon.  Sigmoid diverticulosis No previous EGD.  Patient developed tarry black stools starting Saturday, 5 days ago.  In the background is at least a few months of episodic belly pain, sometimes with nausea.  This especially bothers him at night.  Since he had belly pain in past years associated with lower spine disease, he thought it might be spinal disease radiating to the belly.  Coinciding with the symptoms was decreased p.o. intake and element of anorexia he was taking Tums and Tylenol.  Takes meloxicam regularly for spinal arthritis.  In December he was started on prednisone 20 mg to address spine pain, eventually the dose dropped to 10 mg daily. Since he onset of grossly tarry stools, he has become progressively short of breath, weak, lightheaded.   Follow-up visit with CT surgery yesterday.  Pulmonary wise the plan was follow-up in 1 year but given the above history, Dr. Roxan Hockey had him go to the emergency room for evaluation.  While in the ED he developed acute vomiting and had an episode of frank hemoptysis.  Vital signs with  normal blood pressures.  Heart rate as high as 117 but generally hovering in the low 100s.  Oxygen sats mid 90s.  FOBT +.   Hgb 5.7 ... 2 PRBC ... 7.8.  MCV 99. Iron 20.  Iron sat 6%.  TIBC normal.  Ferritin 32.  Folate, B12 normal. BUN 50 .Marland Kitchen  35.  Creatinine normal. CXR without acute lung process, stable right lung volume loss, scarring.  Incidental humeral head changes suggesting rotator cuff tears along with bilateral glenohumeral osteoarthritis.  Protonix drip initiated.  Family history positive for colon cancer in paternal grandmother at age 46.    Past Medical History:  Diagnosis Date   Allergic rhinitis    Atypical chest pain 12/01/2014   Back pain 01/12/2013   COPD (chronic obstructive pulmonary disease) (HCC)    FeV1 64%-2007   Dyspnea    Emphysema    GERD (gastroesophageal reflux disease)    History of lung abscess    bronchiectasis with RMLandRLL ersection -1996- Dr Arlyce Dice   Hordeolum externum (stye) 04/23/2015   Right eye   Hypertension    Impaired vision    glasses   Osteoarthritis    hands and knees   Sinusitis, acute 04/23/2015    Past Surgical History:  Procedure Laterality  Date   COLONOSCOPY  11/20/2010   Dr.Stark   HERNIA REPAIR  10/03/2009   left knee     lower back surgery  09/02/2008   06/2009   LUNG SURGERY     RML andRUL removed due to bleeding and bronchiectasis and lung abcess   NECK SURGERY  05/03/2008   right foot surgery     VIDEO BRONCHOSCOPY N/A 07/12/2020   Procedure: VIDEO BRONCHOSCOPY;  Surgeon: [name], MD;  Location: [facility];  Service: Thoracic;  Laterality: N/A;   VIDEO BRONCHOSCOPY WITH ENDOBRONCHIAL NAVIGATION N/A 07/12/2020   Procedure: VIDEO BRONCHOSCOPY WITH ENDOBRONCHIAL NAVIGATION;  Surgeon: [name], MD;  Location: [facility];  Service: Thoracic;  Laterality: N/A;    Prior to Admission medications   Medication Sig Start Date End Date Taking? Authorizing Provider  acetaminophen (TYLENOL) 650 MG CR  tablet Take 1,300 mg by mouth every 8 (eight) hours as needed for pain.   Yes [provider]  albuterol (VENTOLIN HFA) 108 (90 Base) MCG/ACT inhaler INHALE 2 PUFFS INTO THE LUNGS EVERY 6 HOURS AS NEEDED FOR WHEEZING OR SHORTNESS OF BREATH. Patient taking differently: Inhale 2 puffs into the lungs every 6 (six) hours as needed for wheezing or shortness of breath. 09/03/21  Yes Mosie Lukes, MD  amLODipine (NORVASC) 5 MG tablet TAKE 1 TABLET EVERY DAY Patient taking differently: Take 5 mg by mouth daily. TAKE 1 TABLET EVERY DAY 04/09/21  Yes Mosie Lukes, MD  benzonatate (TESSALON) 100 MG capsule Take 1 capsule (100 mg total) by mouth 3 (three) times daily as needed for cough. 10/30/21  Yes [name], MD  calcium elemental as carbonate (BARIATRIC TUMS ULTRA) 400 MG chewable tablet Chew 1,000 mg by mouth 2 (two) times daily.    Yes [provider]  Capsaicin-Menthol (SALONPAS GEL EX) Apply 1 application topically daily as needed (arthritis pain).   Yes [provider]  cetirizine (ZYRTEC) 10 MG tablet Take 10 mg by mouth daily.   Yes [provider]  fluticasone (FLONASE) 50 MCG/ACT nasal spray Place 2 sprays into both nostrils daily. 04/09/21  Yes Mosie Lukes, MD  Glucosamine-Chondroitin 750-600 MG TABS Take 1 tablet by mouth 2 (two) times daily.   Yes [provider]  Guaifenesin 1200 MG TB12 Take 600 mg by mouth 2 (two) times daily.   Yes [provider]  loratadine (CLARITIN) 10 MG tablet Take 10 mg by mouth daily.   Yes [provider]  meloxicam (MOBIC) 15 MG tablet TAKE 1 TABLET EVERY DAY AS NEEDED FOR PAIN Patient taking differently: Take 15 mg by mouth daily. 09/03/21  Yes Mosie Lukes, MD  Menthol (ROBITUSSIN COUGH DROPS MT) Use as directed 1 lozenge in the mouth or throat 4 (four) times daily as needed (cough).    Yes [provider]  Menthol, Topical Analgesic, (BLUE-EMU MAXIMUM STRENGTH EX) Apply 1  application topically daily as needed (arthritis pain).    Yes [provider]  montelukast (SINGULAIR) 10 MG tablet TAKE 1 TABLET EVERY DAY Patient taking differently: Take 10 mg by mouth at bedtime. 10/09/20  Yes Mosie Lukes, MD  Multiple Vitamin (MULTIVITAMIN WITH MINERALS) TABS tablet Take 1 tablet by mouth daily.    Yes [provider]  predniSONE (DELTASONE) 20 MG tablet Take 1 tablet (20 mg total) by mouth daily with breakfast. Patient taking differently: Take 10 mg by mouth daily with breakfast. 08/16/21  Yes Mosie Lukes, MD  [facility]  ELLIPTA 100-62.5-25 MCG/ACT AEPB INHALE 1 PUFF INTO THE LUNGS DAILY Patient taking differently: Inhale 1 puff into the lungs daily. 07/12/21  Yes Mosie Lukes, MD  diclofenac Sodium (VOLTAREN) 1 % GEL Apply 2 g topically 3 (three) times daily as needed (pain).    [provider]  doxycycline (VIBRAMYCIN) 100 MG capsule Take 1 capsule (100 mg total) by mouth 2 (two) times daily. Patient not taking: Reported on 10/30/2021 09/06/21   Copland, Gay Filler, MD  imiquimod (ALDARA) 5 % cream Apply topically 3 (three) times a week. Patient not taking: Reported on 10/30/2021 12/24/19   Mosie Lukes, MD    Scheduled Meds:  fluticasone furoate-vilanterol  1 puff Inhalation Daily   And   umeclidinium bromide  1 puff Inhalation Daily   [START ON 11/03/2021] pantoprazole  40 mg Intravenous Q12H   Infusions:  lactated ringers 100 mL/hr at 10/31/21 7416   pantoprazole 8 mg/hr (10/31/21 0622)   PRN Meds: acetaminophen **OR** acetaminophen, albuterol, HYDROmorphone (DILAUDID) injection, ipratropium-albuterol, metoprolol tartrate, ondansetron **OR** ondansetron (ZOFRAN) IV   Allergies as of 10/30/2021 - Review Complete 10/30/2021  Allergen Reaction Noted   Losartan Other (See Comments) 03/13/2020    Family History  Problem Relation Age of Onset   Heart failure Mother        age 77   Prostate cancer Father        father died  prostate ca   Obstructive Sleep Apnea Brother    Obesity Brother    Colon cancer Paternal Grandmother    Healthy Son    Coronary artery disease Other        1st degree relative<60   Stroke Other        1st degree relative<50   Esophageal cancer Neg Hx    Stomach cancer Neg Hx     Social History   Socioeconomic History   Marital status: Married    Spouse name: Not on file   Number of children: Not on file   Years of education: Not on file   Highest education level: Not on file  Occupational History   Occupation: retired Music therapist  Tobacco Use   Smoking status: Former    Packs/day: 1.50    Years: 38.00    Pack years: 57.00    Types: Cigarettes    Quit date: 09/03/2000    Years since quitting: 21.1    Passive exposure: Past   Smokeless tobacco: Never  Vaping Use   Vaping Use: Never used  Substance and Sexual Activity   Alcohol use: Yes    Alcohol/week: 1.0 standard drink    Types: 1 Cans of beer per week    Comment: 3 or 4 nights weekly   Drug use: No   Sexual activity: Never  Other Topics Concern   Not on file  Social History Narrative   Retired Music therapist   Patient states former smoker. 1 1/2 ppd x 38 yrs  Quit in Jan 2002   Married - 2 weeks (4th marriage)   divorced,  remarried 84-2004 (lost wife to lung ca),  remarried (divorced),    1 son  - 83 [name])   Alcohol use-yes (2-3 beers per week)           Social Determinants of Health   Financial Resource Strain: Low Risk    Difficulty of Paying Living Expenses: Not very hard  Food Insecurity: No Food Insecurity   Worried About Charity fundraiser in the  Last Year: Never true   Ran Out of Food in the Last Year: Never true  Transportation Needs: No Transportation Needs   Lack of Transportation (Medical): No   Lack of Transportation (Non-Medical): No  Physical Activity: Sufficiently Active   Days of Exercise per Week: 6 days   Minutes of Exercise per Session: 30 min  Stress: No  Stress Concern Present   Feeling of Stress : Not at all  Social Connections: Moderately Integrated   Frequency of Communication with Friends and Family: More than three times a week   Frequency of Social Gatherings with Friends and Family: More than three times a week   Attends Religious Services: More than 4 times per year   Active Member of [employer] or Organizations: No   Attends Archivist Meetings: Never   Marital Status: Married  Human resources officer Violence: Not At Risk   Fear of Current or Ex-Partner: No   Emotionally Abused: No   Physically Abused: No   Sexually Abused: No    REVIEW OF SYSTEMS: Constitutional: Weakness, fatigue as per HPI.  Generally has good energy. ENT:  No nose bleeds Pulm: Has not seen any hemoptysis in several weeks. CV:  No palpitations, no LE edema.  No angina. GU:  No hematuria, no frequency GI: No dysphagia. Heme: No unusual bleeding or bruising Transfusions: None Neuro: No syncope, no seizures. Derm:  No itching, no rash or sores.  Endocrine:  No sweats or chills.  No polyuria or dysuria Immunization: Viewed.  He is current on multiple vaccines. Travel:  None beyond local counties in last few months.    PHYSICAL EXAM: Vital signs in last 24 hours: Vitals:   10/31/21 0600 10/31/21 0730  BP: 136/78 (!) 143/75  Pulse: (!) 101 (!) 103  Resp: 16 14  Temp:    SpO2: 95% 96%   Wt Readings from Last 3 Encounters:  10/30/21 64.9 kg  10/30/21 64.9 kg  10/16/21 64.9 kg    General: Pleasant, well-appearing, comfortable.  Provides excellent history. Head: No facial asymmetry or swelling.  No signs of head trauma. Eyes: Conjunctiva pink.  No scleral icterus. Ears: Not hard of hearing. Nose: No congestion or discharge Mouth: Oropharynx moist, pink, clear.  Tongue midline.  Dentures in place. Neck: No JVD, no masses, no thyromegaly Lungs: Clear bilaterally.  No labored breathing or cough. Heart: RRR.  No MRG.  S1, S2 present Abdomen:  Soft.  Not tender or distended.  No HSM, masses, bruits, hernias.  Bowel sounds active..   Rectal: Deferred.  Performed by Dr. Laverta Baltimore in the ED shows no clear melena but saw streaks of bright red blood.  FOBT positive Musc/Skeltl: No joint redness, swelling or gross deformity Extremities: No CCE Neurologic: Alert.  Oriented x3.  No limb weakness or tremors. Skin: No rash, no sores, no suspicious lesions.  No purpura or bruising. Nodes: No cervical adenopathy Psych: Calm, pleasant, cooperative  Intake/Output from previous day: No intake/output data recorded. Intake/Output this shift: No intake/output data recorded.  LAB RESULTS: Recent Labs    10/30/21 1642 10/30/21 1812 10/31/21 0259  WBC 10.2  --  10.0  HGB 5.7* 5.8* 7.8*  HCT 17.7* 17.0* 23.2*  PLT 266  --  211   BMET Lab Results  Component Value Date   NA 138 10/31/2021   NA 136 10/30/2021   NA 136 10/30/2021   K 3.9 10/31/2021   K 3.8 10/30/2021   K 3.9 10/30/2021   CL 107 10/31/2021  CL 106 10/30/2021   CL 105 10/30/2021   CO2 23 10/31/2021   CO2 22 10/30/2021   CO2 27 08/16/2021   GLUCOSE 88 10/31/2021   GLUCOSE 171 (H) 10/30/2021   GLUCOSE 144 (H) 10/30/2021   BUN 35 (H) 10/31/2021   BUN 45 (H) 10/30/2021   BUN 50 (H) 10/30/2021   CREATININE 0.88 10/31/2021   CREATININE 1.10 10/30/2021   CREATININE 1.05 10/30/2021   CALCIUM 7.9 (L) 10/31/2021   CALCIUM 9.0 10/30/2021   CALCIUM 9.5 08/16/2021   LFT Recent Labs    10/30/21 1642  PROT 5.4*  ALBUMIN 2.6*  AST 19  ALT 15  ALKPHOS 33*  BILITOT 0.6   PT/INR Lab Results  Component Value Date   INR 1.1 10/30/2021   INR 1.1 07/10/2020   INR 1.18 09/28/2016   Hepatitis Panel No results for input(s): HEPBSAG, HCVAB, HEPAIGM, HEPBIGM in the last 72 hours. C-Diff No components found for: CDIFF Lipase  No results found for: LIPASE  Drugs of Abuse  No results found for: LABOPIA, COCAINSCRNUR, LABBENZ, AMPHETMU, THCU, LABBARB   RADIOLOGY  STUDIES: CT Chest Wo Contrast  Result Date: 10/30/2021 CLINICAL DATA:  Follow-up lung nodule. History of right upper and right middle lobectomies. EXAM: CT CHEST WITHOUT CONTRAST TECHNIQUE: Multidetector CT imaging of the chest was performed following the standard protocol without IV contrast. RADIATION DOSE REDUCTION: This exam was performed according to the departmental dose-optimization program which includes automated exposure control, adjustment of the mA and/or kV according to patient size and/or use of iterative reconstruction technique. COMPARISON:  05/01/2021 FINDINGS: Cardiovascular: The heart size appears within normal limits. Aortic atherosclerosis with coronary artery calcifications. No pericardial effusion. Mediastinum/Nodes: No enlarged mediastinal or axillary lymph nodes. Thyroid gland, trachea, and esophagus demonstrate no significant findings. Lungs/Pleura: Centrilobular emphysema. Status post right upper and right middle lobectomies. Pleural thickening overlying the apical portion of the right lung is again noted and appears similar to the previous exam, image 38/2. The cavitary component of the pleural thickening has resolved when compared with the previous exam. Similar appearance of pleuroparenchymal scarring within the posterior and lateral right lung base. Stable chronic appearing subpleural collections of gas within the posterior right lung base, image 98/2. The appearance is unchanged compared with 08/29/2020. Upper Abdomen: Aortic atherosclerosis. No acute abnormality within the imaged portions of the upper abdomen. Musculoskeletal: Postop change from ACDF. Previous PLIF noted within the lumbar spine. Degenerative disc disease identified within the lower thoracic spine. No acute or suspicious osseous findings. IMPRESSION: 1. Status post right upper and right middle lobectomies. 2. Stable appearance of pleural thickening overlying the apical portion of the right lung. The cavitary  component within this area has resolved in the interval. 3. Stable appearance of pleuroparenchymal scarring within the right lung base with stable chronic areas of loculated pleural gas overlying the posterior left base. 4. Aortic Atherosclerosis (ICD10-I70.0) and Emphysema (ICD10-J43.9). Coronary artery calcifications. Electronically Signed   By: Kerby Moors M.D.   On: 10/30/2021 14:50   DG Chest Portable 1 View  Result Date: 10/30/2021 CLINICAL DATA:  Shortness of breath. Hematemesis. History of right lung partial resection. EXAM: PORTABLE CHEST 1 VIEW COMPARISON:  Right lung radiographs 09/06/2021 FINDINGS: Cardiac silhouette and mediastinal contours are unchanged and within normal limits. There is again moderate right lung volume loss. Stable right apical thickening. Scarring within the right lung base unchanged. No new focal airspace opacity. No definite pleural effusion. No pneumothorax. ACDF hardware again overlies the cervical spine. High-riding  humeral heads bilaterally suggesting full-thickness superior rotator cuff tears. Severe right greater than left glenohumeral osteoarthritis. IMPRESSION:: IMPRESSION: 1. No significant change in chronic right lung volume loss and scarring. 2. No acute lung process. Electronically Signed   By: Yvonne Kendall M.D.   On: 10/30/2021 18:39      IMPRESSION:     GIB w black stools, anemia.  Hgb improved after 2 PRBCs     Degenerative spine disease with chronic back pain and previous lumbar surgery.  Current management with Mobic and prednisone 10 mg/daily    PLAN:       EGD today.  Patient agreeable.  Continue Protonix drip for now.   Azucena Freed  10/31/2021, 8:19 AM Phone [phone]       [place] Attending   I have also seen the patient. Convincing story for NSAID-induced UGI Bleed. EGD now. The risks and benefits as well as alternatives of endoscopic procedure(s) have been discussed and reviewed. All questions answered. The patient  agrees to proceed.  Gatha Mayer, MD, [facility] 10/31/2021 12:10 PM

## 2021-10-31 NOTE — Anesthesia Postprocedure Evaluation (Signed)
Anesthesia Post Note ? ?Patient: ISRAEL WERTS ? ?Procedure(s) Performed: ESOPHAGOGASTRODUODENOSCOPY (EGD) WITH PROPOFOL ?SCLEROTHERAPY ?HOT HEMOSTASIS (ARGON PLASMA COAGULATION/BICAP) ?HEMOSTASIS CONTROL ? ?  ? ?Patient location during evaluation: PACU ?Anesthesia Type: General ?Level of consciousness: awake and alert ?Pain management: pain level controlled ?Vital Signs Assessment: post-procedure vital signs reviewed and stable ?Respiratory status: spontaneous breathing, nonlabored ventilation and respiratory function stable ?Cardiovascular status: blood pressure returned to baseline ?Postop Assessment: no apparent nausea or vomiting ?Anesthetic complications: no ? ? ?No notable events documented. ? ?Last Vitals:  ?Vitals:  ? 10/31/21 1415 10/31/21 1430  ?BP: (!) 151/137 (!) 163/73  ?Pulse: 87 (!) 113  ?Resp: (!) 24 (!) 21  ?Temp: 36.7 ?C   ?SpO2: 98% 97%  ?  ?Last Pain:  ?Vitals:  ? 10/31/21 1342  ?TempSrc:   ?PainSc: 3   ? ? ?  ?  ?  ?  ?  ?  ? ?Marthenia Rolling ? ? ? ? ?

## 2021-10-31 NOTE — Anesthesia Procedure Notes (Addendum)
Procedure Name: Intubation ?Date/Time: 10/31/2021 12:40 PM ?Performed by: Alain Marion, CRNA ?Pre-anesthesia Checklist: Patient identified, Emergency Drugs available, Suction available and Patient being monitored ?Patient Re-evaluated:Patient Re-evaluated prior to induction ?Oxygen Delivery Method: Circle System Utilized ?Preoxygenation: Pre-oxygenation with 100% oxygen ?Induction Type: IV induction, Rapid sequence and Cricoid Pressure applied ?Laryngoscope Size: Sabra Heck and 2 ?Grade View: Grade I ?Tube type: Oral ?Tube size: 7.5 mm ?Number of attempts: 1 ?Placement Confirmation: ETT inserted through vocal cords under direct vision, positive ETCO2 and breath sounds checked- equal and bilateral ?Secured at: 22 cm ?Tube secured with: Tape ?Dental Injury: Teeth and Oropharynx as per pre-operative assessment  ?Comments: Pt having a significant GI bleed. Dr. Carlean Purl wants pt converted to [place] performed.  ? ? ? ? ?

## 2021-10-31 NOTE — Anesthesia Procedure Notes (Signed)
Procedure Name: Veteran ?Date/Time: 10/31/2021 12:09 PM ?Performed by: Reece Agar, CRNA ?Pre-anesthesia Checklist: Patient identified, Suction available, Emergency Drugs available and Patient being monitored ?Patient Re-evaluated:Patient Re-evaluated prior to induction ?Oxygen Delivery Method: Nasal cannula ?Preoxygenation: Pre-oxygenation with 100% oxygen ?Induction Type: IV induction ? ? ? ? ?

## 2021-10-31 NOTE — Transfer of Care (Addendum)
Immediate Anesthesia Transfer of Care Note ? ?Patient: [name] ? ?Procedure(s) Performed: ESOPHAGOGASTRODUODENOSCOPY (EGD) WITH PROPOFOL ? ?Patient Location: PACU ? ?Anesthesia Type:General ? ?Level of Consciousness: awake and alert  ? ?Airway & Oxygen Therapy: Patient Spontanous Breathing and Patient connected to nasal cannula oxygen ? ?Post-op Assessment: Report given to RN and Post -op Vital signs reviewed and stable ? ?Post vital signs: Reviewed and stable ? ?Last Vitals:  ?Vitals Value Taken Time  ?BP 136/75 10/31/21 13:13  ?Temp    ?Pulse 97 10/31/21 13:13  ?Resp 19 10/31/21 13:13  ?SpO2 99 10/31/21 13:13  ? ? ?Last Pain:  ?Vitals:  ? 10/31/21 1115  ?TempSrc:   ?PainSc: 0-No pain  ?   ? ?  ? ?Complications: No notable events documented. ?

## 2021-10-31 NOTE — ED Notes (Signed)
RN noted irregular HR, two runs of Vtach less than 5 beats, and at one point monitor registered 182HR. RN sent secure chat to Dr Cruzita Lederer regarding same. ?

## 2021-10-31 NOTE — Telephone Encounter (Signed)
Patient went to ED

## 2021-10-31 NOTE — Telephone Encounter (Signed)
Patient called the office stating he received a letter from [facility] stating the procedure that Dr. Benjamine Mola recommended was approved but the letter did not say what procedure. Patient states he does not know if that was for his back or shoulder. Patient states he is currently in the hospital and just had surgery for stomach ulcers. Patient states he does not know when he will be discharged or how long the recovery will be. Patient wishes to hold on the procedure Dr. Benjamine Mola recommended and canceled his upcoming appointment but will reschedule when he is doing better. ?

## 2021-10-31 NOTE — Progress Notes (Signed)
?   10/31/21 1850  ?Assess: MEWS Score  ?Temp 99.3 ?F (37.4 ?C)  ?BP (!) 178/75  ?Pulse Rate (!) 111  ?ECG Heart Rate (!) 112  ?Resp (!) 22  ?Level of Consciousness Alert  ?SpO2 97 %  ?O2 Device Room Air  ?Assess: MEWS Score  ?MEWS Temp 0  ?MEWS Systolic 0  ?MEWS Pulse 2  ?MEWS RR 1  ?MEWS LOC 0  ?MEWS Score 3  ?MEWS Score Color Yellow  ?Assess: if the MEWS score is Yellow or Red  ?Were vital signs taken at a resting state? Yes  ?Focused Assessment No change from prior assessment  ?Early Detection of Sepsis Score *See Row Information* Low  ?MEWS guidelines implemented *See Row Information* Yes  ?Treat  ?MEWS Interventions Administered prn meds/treatments  ?Pain Scale 0-10  ?Pain Score 0  ?Take Vital Signs  ?Increase Vital Sign Frequency  Yellow: Q 2hr X 2 then Q 4hr X 2, if remains yellow, continue Q 4hrs  ?Escalate  ?MEWS: Escalate Yellow: discuss with charge nurse/RN and consider discussing with provider and RRT  ?Notify: Charge Nurse/RN  ?Name of Charge Nurse/RN Notified Britta Mccreedy, RN  ?Date Charge Nurse/RN Notified 10/31/21  ?Time Charge Nurse/RN Notified 1926  ?Document  ?Patient Outcome Stabilized after interventions  ?Progress note created (see row info) Yes  ? ? ?

## 2021-11-01 LAB — BPAM RBC
Blood Product Expiration Date: 202303272359
Blood Product Expiration Date: 202304042359
Blood Product Expiration Date: 202304042359
ISSUE DATE / TIME: 202302281827
ISSUE DATE / TIME: 202302282118
ISSUE DATE / TIME: 202303012149
Unit Type and Rh: 5100
Unit Type and Rh: 5100
Unit Type and Rh: 5100

## 2021-11-01 LAB — TYPE AND SCREEN
ABO/RH(D): O POS
Antibody Screen: NEGATIVE
Unit division: 0
Unit division: 0
Unit division: 0

## 2021-11-01 LAB — COMPREHENSIVE METABOLIC PANEL
ALT: 16 U/L (ref 0–44)
AST: 23 U/L (ref 15–41)
Albumin: 2.4 g/dL — ABNORMAL LOW (ref 3.5–5.0)
Alkaline Phosphatase: 33 U/L — ABNORMAL LOW (ref 38–126)
Anion gap: 10 (ref 5–15)
BUN: 15 mg/dL (ref 8–23)
CO2: 22 mmol/L (ref 22–32)
Calcium: 8.2 mg/dL — ABNORMAL LOW (ref 8.9–10.3)
Chloride: 107 mmol/L (ref 98–111)
Creatinine, Ser: 0.94 mg/dL (ref 0.61–1.24)
GFR, Estimated: >60 mL/min (ref >60)
Glucose, Bld: 91 mg/dL (ref 70–99)
Potassium: 3.5 mmol/L (ref 3.5–5.1)
Sodium: 139 mmol/L (ref 135–145)
Total Bilirubin: 1.2 mg/dL (ref 0.3–1.2)
Total Protein: 4.6 g/dL — ABNORMAL LOW (ref 6.5–8.1)

## 2021-11-01 LAB — HEMOGLOBIN AND HEMATOCRIT, BLOOD
HCT: 26.3 % — ABNORMAL LOW (ref 39.0–52.0)
Hemoglobin: 8.8 g/dL — ABNORMAL LOW (ref 13.0–17.0)

## 2021-11-01 LAB — CBC
HCT: 25.4 % — ABNORMAL LOW (ref 39.0–52.0)
Hemoglobin: 8.6 g/dL — ABNORMAL LOW (ref 13.0–17.0)
MCH: 31.9 pg (ref 26.0–34.0)
MCHC: 33.9 g/dL (ref 30.0–36.0)
MCV: 94.1 fL (ref 80.0–100.0)
Platelets: 216 10*3/uL (ref 150–400)
RBC: 2.7 MIL/uL — ABNORMAL LOW (ref 4.22–5.81)
RDW: 15.4 % (ref 11.5–15.5)
WBC: 7.2 10*3/uL (ref 4.0–10.5)
nRBC: 0.3 % — ABNORMAL HIGH (ref 0.0–0.2)

## 2021-11-01 MED ORDER — ADULT MULTIVITAMIN W/MINERALS CH
1.0000 | ORAL_TABLET | Freq: Every day | ORAL | Status: DC
  Administered 2021-11-01 – 2021-11-04 (×4): 1 via ORAL
  Filled 2021-11-01 (×4): qty 1

## 2021-11-01 MED ORDER — LIDOCAINE 5 % EX PTCH
2.0000 | MEDICATED_PATCH | CUTANEOUS | Status: DC
  Administered 2021-11-01 – 2021-11-03 (×3): 2 via TRANSDERMAL
  Filled 2021-11-01 (×4): qty 2

## 2021-11-01 MED ORDER — BOOST / RESOURCE BREEZE PO LIQD CUSTOM
1.0000 | Freq: Three times a day (TID) | ORAL | Status: DC
  Administered 2021-11-01 – 2021-11-04 (×9): 1 via ORAL
  Filled 2021-11-01: qty 1

## 2021-11-01 NOTE — Progress Notes (Addendum)
Pt used the BR because he felt the urge to poop, No BM but when he wiped he wiped a quarter size amount of bright red blood, not clotting in appearance, more of a smudge pattern. Pt has not had a BM today. Present/active bowel sounds. Hx of straining and he does state that his rectal area is sore after wiping. Will notify his RN. ?

## 2021-11-01 NOTE — TOC Progression Note (Signed)
Transition of Care (TOC) - Progression Note  ? ? ?Patient Details  ?Name: [name] ?MRN: [id number] ?Date of Birth: 18-Jul-1946 ? ?Transition of Care (TOC) CM/SW Contact  ?Zenon Mayo, RN ?Phone Number: ?11/01/2021, 3:59 PM ? ?Clinical Narrative:    ?From Home, GIB, will not have EGD today, will monitor stool per Staff RN in progression conts on protonix drip and LR at 100 was transfused one unit yesterday. TOC will continue to follow for dc needs.  ? ? ?  ?  ? ?Expected Discharge Plan and Services ?  ?  ?  ?  ?  ?                ?  ?  ?  ?  ?  ?  ?  ?  ?  ?  ? ? ?Social Determinants of Health (SDOH) Interventions ?Financial Strain Interventions: Other (Comment) (Asked Pt if he wanted me to ask about resourced regarding medications, he declined at this time 11/01/2021) ? ?Readmission Risk Interventions ?No flowsheet data found. ? ?

## 2021-11-01 NOTE — Progress Notes (Addendum)
? ? ? ?Progress Note ?Hospital Day: 3 ? ?Chief Complaint:    GI bleed ?   ? ?ASSESSMENT AND PLAN  ? ?Brief History: 76 yo male with COPD, HTN, diverticulosis. Admitted with GI bleed and anemia.  ? ?# GI bleed / acute anemia but with possibly iron deficiency / Duodenal ulcer with visible vessel s/p epinephrine injection 10/31/21.  ?--Hgb improved from 5.7 to 8.6 today with 3 u PRBC.  ?--TIBC 342, 6 % iron sat, ferritin 32 ?--On Mobic at home which can cause ulcers.  H.pylori status ? ?--No BMs / melena / vomiting today. Will try clear liquids ?--Continue PPI infusion through today then BID ? ? ?Attending: ? ?I have also seen the patient. He is improved and stable. We will check H pylori serology. I think earliest dc would be Sat AMif all ok. ? ?Gatha Mayer, MD, Marval Regal ?[facility] ?11/01/2021 11:44 AM ? ?SUBJECTIVE  ? ? ?  No further bleeding, feels okay.   ? ? ?OBJECTIVE  ? ?  ? ?Scheduled inpatient medications:  ? fluticasone furoate-vilanterol  1 puff Inhalation Daily  ? And  ? umeclidinium bromide  1 puff Inhalation Daily  ? [START ON 11/03/2021] pantoprazole  40 mg Intravenous Q12H  ? ?Continuous inpatient infusions:  ? lactated ringers 100 mL/hr at 10/31/21 1508  ? pantoprazole 8 mg/hr (10/31/21 1509)  ? ?PRN inpatient medications: acetaminophen **OR** acetaminophen, albuterol, HYDROmorphone (DILAUDID) injection, ipratropium-albuterol, metoprolol tartrate, ondansetron **OR** ondansetron (ZOFRAN) IV ? ?Vital signs in last 24 hours: ?Temp:  [98 ?F (36.7 ?C)-99.6 ?F (37.6 ?C)] 99.6 ?F (37.6 ?C) (03/02 0730) ?Pulse Rate:  [64-118] 118 (03/02 0730) ?Resp:  [16-24] 20 (03/02 0730) ?BP: (127-178)/(60-137) 139/84 (03/02 0730) ?SpO2:  [92 %-100 %] 97 % (03/02 0835) ?Weight:  [61.5 kg-64.9 kg] 61.5 kg (03/02 0403) ?Last BM Date : 10/31/21 ? ?Intake/Output Summary (Last 24 hours) at 11/01/2021 0909 ?Last data filed at 11/01/2021 0730 ?Gross per 24 hour  ?Intake 1015 ml  ?Output 2085 ml  ?Net -1070 ml   ? ? ? ?Physical Exam:  ?General: Alert male in NAD ?Heart:  Regular rate and rhythm. No lower extremity edema ?Pulmonary: Normal respiratory effort ?Abdomen: Soft, nondistended, nontender. Normal bowel sounds.  ?Neurologic: Alert and oriented ?Psych: Pleasant. Cooperative.  ? ?Filed Weights  ? 10/30/21 1619 10/31/21 1115 11/01/21 0403  ?Weight: 64.9 kg 64.9 kg 61.5 kg  ? ? ?ntake/Output from previous day: ?03/01 0701 - 03/02 0700 ?In: 5329 [P.O.:300; I.V.:400; Blood:315] ?Out: 1700 [Urine:1500; Blood:200] ?Intake/Output this shift: ?Total I/O ?In: 0  ?Out: 385 [Urine:385] ? ? ? ? ?Lab Results: ?Recent Labs  ?  10/31/21 ?0259 10/31/21 ?1919 11/01/21 ?0343  ?WBC 10.0 7.9 7.2  ?HGB 7.8* 8.1* 8.6*  ?HCT 23.2* 24.1* 25.4*  ?PLT 211 239 216  ? ?BMET ?Recent Labs  ?  10/30/21 ?1642 10/30/21 ?1812 10/31/21 ?0259 11/01/21 ?0343  ?NA 136 136 138 139  ?K 3.9 3.8 3.9 3.5  ?CL 105 106 107 107  ?CO2 22  --  23 22  ?GLUCOSE 144* 171* 88 91  ?BUN 50* 45* 35* 15  ?CREATININE 1.05 1.10 0.88 0.94  ?CALCIUM 9.0  --  7.9* 8.2*  ? ?LFT ?Recent Labs  ?  11/01/21 ?0343  ?PROT 4.6*  ?ALBUMIN 2.4*  ?AST 23  ?ALT 16  ?ALKPHOS 33*  ?BILITOT 1.2  ? ? ? ? ? ?Principal Problem: ?  Acute upper GI bleed ?Active Problems: ?  Benign essential HTN ?  COPD GOLD  II D ?  Anemia due to acute blood loss ?  DDD (degenerative disc disease), lumbar ?  Osteoarthritis ?  Acute duodenal ulcer with hemorrhage ? ? ? ? LOS: 2 days  ? ?Tye Savoy ,NP 11/01/2021, 9:09 AM ? ? ? ? ? ? ?

## 2021-11-01 NOTE — Progress Notes (Signed)
From 1020AM-1211PM Pt did not have LR or Protonix IV solution running through is IV and should not be counted towards his hourly volume intake for those hours. RN team was waiting on medication from [place] ?

## 2021-11-01 NOTE — Progress Notes (Signed)
PROGRESS NOTE  [name] ZOX:[id number] DOB: March 15, 1946 DOA: 10/30/2021 PCP: [name], MD   LOS: 2 days   Brief Narrative / Interim history: 76 year old male with history of osteoarthritis, DDD, COPD, history of right upper limb middle bilobectomy for bronchiectasis and hemoptysis in 1996, tobacco use, who comes into the hospital with black tarry stools, shortness of breath, fatigue.  He also reports vomiting bright red blood.  He was found to have anemia, was given blood transfusions and GI was consulted.  Subjective / 24h Interval events: Complains of being very hungry.  Wants to eat something.  Also wants to go home  Assesement and Plan: Principal Problem:   Acute upper GI bleed Active Problems:   Benign essential HTN   COPD GOLD II D   Anemia due to acute blood loss   DDD (degenerative disc disease), lumbar   Osteoarthritis   Acute duodenal ulcer with hemorrhage   Assessment and Plan: Principal problem Acute upper GI bleed, melena-GI consulted, appreciate input.  Hemoglobin was 5.7 on admission, received a total of 3 units of red blood cells.  Underwent an EGD on 3/1 which showed 1 nonbleeding cratered duodenal ulcer with a visible vessel status post epinephrine injection as well as unsuccessful APC.  He also had a hemostatic spray deployed.  The bleeding appeared to have stopped however this cannot be confirmed and patient was carefully monitored with prolonged n.p.o., IV PPI.  Will be allowed a clear liquid diet today, hemoglobin has remained stable and does not appear to have any further issues.   Active problems Anemia due to acute blood loss -due to #1, status post 2 units of packed red blood cells.  Continue to monitor hemoglobin, 8.6 this morning  Benign essential HTN-continue as needed's   COPD GOLD II D-Continue inhalers.  Respiratory status stable.  History of bilobectomy by cardiac surgery noted   DDD (degenerative disc disease), lumbar -Hold Mobic and  prednisone with acute GI bleeding.    Osteoarthritis -Pain control provided.  Abnormal EKG, bigeminy-known issue, he occasionally gets tachycardic.  Asymptomatic, follows up as an outpatient  Scheduled Meds:  fluticasone furoate-vilanterol  1 puff Inhalation Daily   And   umeclidinium bromide  1 puff Inhalation Daily   [START ON 11/03/2021] pantoprazole  40 mg Intravenous Q12H   Continuous Infusions:  lactated ringers 100 mL/hr at 10/31/21 1508   pantoprazole 8 mg/hr (10/31/21 1509)   PRN Meds:.acetaminophen **OR** acetaminophen, albuterol, HYDROmorphone (DILAUDID) injection, ipratropium-albuterol, metoprolol tartrate, ondansetron **OR** ondansetron (ZOFRAN) IV  Diet Orders (From admission, onward)     Start     Ordered   11/01/21 0948  Diet clear liquid Room service appropriate? Yes; Fluid consistency: Thin  Diet effective now       Question Answer Comment  Room service appropriate? Yes   Fluid consistency: Thin      11/01/21 0947            DVT prophylaxis: SCDs Start: 10/30/21 2028   Lab Results  Component Value Date   PLT 216 11/01/2021      Code Status: Full Code  Family Communication: no family at bedside  Status is: Inpatient  Remains inpatient appropriate because: Severity of illness  Level of care: Progressive  Consultants:  GI  Procedures:  none  Microbiology  none  Antimicrobials: none    Objective: Vitals:   11/01/21 0730 11/01/21 0834 11/01/21 0835 11/01/21 1022  BP: 139/84   (!) 142/82  Pulse: (!) 118   Marland Kitchen)  103  Resp: 20   18  Temp: 99.6 F (37.6 C)   98.9 F (37.2 C)  TempSrc: Oral   Oral  SpO2: 95% 97% 97% 98%  Weight:      Height:        Intake/Output Summary (Last 24 hours) at 11/01/2021 1038 Last data filed at 11/01/2021 1022 Gross per 24 hour  Intake 1015 ml  Output 2235 ml  Net -1220 ml   Wt Readings from Last 3 Encounters:  11/01/21 61.5 kg  10/30/21 64.9 kg  10/16/21 64.9 kg     Examination:  Constitutional: NAD Eyes: lids and conjunctivae normal, no scleral icterus ENMT: mmm Neck: normal, supple Respiratory: clear to auscultation bilaterally, no wheezing, no crackles. Normal respiratory effort.  Cardiovascular: Regular rate and rhythm, no murmurs / rubs / gallops. No LE edema. Abdomen: soft, no distention, no tenderness. Bowel sounds positive.  Skin: no rashes Neurologic: no focal deficits, equal strength   Data Reviewed: I have independently reviewed following labs and imaging studies   CBC Recent Labs  Lab 10/30/21 1642 10/30/21 1812 10/31/21 0259 10/31/21 1919 11/01/21 0343  WBC 10.2  --  10.0 7.9 7.2  HGB 5.7* 5.8* 7.8* 8.1* 8.6*  HCT 17.7* 17.0* 23.2* 24.1* 25.4*  PLT 266  --  211 239 216  MCV 99.4  --  96.3 96.0 94.1  MCH 32.0  --  32.4 32.3 31.9  MCHC 32.2  --  33.6 33.6 33.9  RDW 15.5  --  15.2 15.3 15.4     Recent Labs  Lab 10/30/21 1642 10/30/21 1812 10/30/21 1826 10/31/21 0259 10/31/21 1919 11/01/21 0343  NA 136 136  --  138  --  139  K 3.9 3.8  --  3.9  --  3.5  CL 105 106  --  107  --  107  CO2 22  --   --  23  --  22  GLUCOSE 144* 171*  --  88  --  91  BUN 50* 45*  --  35*  --  15  CREATININE 1.05 1.10  --  0.88  --  0.94  CALCIUM 9.0  --   --  7.9*  --  8.2*  AST 19  --   --   --   --  23  ALT 15  --   --   --   --  16  ALKPHOS 33*  --   --   --   --  33*  BILITOT 0.6  --   --   --   --  1.2  ALBUMIN 2.6*  --   --   --   --  2.4*  MG  --   --   --   --  1.8  --   INR  --   --  1.1  --   --   --      ------------------------------------------------------------------------------------------------------------------ No results for input(s): CHOL, HDL, LDLCALC, TRIG, CHOLHDL, LDLDIRECT in the last 72 hours.  Lab Results  Component Value Date   HGBA1C 5.7 08/16/2021   ------------------------------------------------------------------------------------------------------------------ No results for input(s):  TSH, T4TOTAL, T3FREE, THYROIDAB in the last 72 hours.  Invalid input(s): FREET3  Cardiac Enzymes No results for input(s): CKMB, TROPONINI, MYOGLOBIN in the last 168 hours.  Invalid input(s): CK ------------------------------------------------------------------------------------------------------------------ No results found for: BNP  CBG: No results for input(s): GLUCAP in the last 168 hours.  Recent Results (from the past 240 hour(s))  Resp Panel by RT-PCR (  Flu A&B, Covid) Nasopharyngeal Swab     Status: None   Collection Time: 10/30/21  6:04 PM   Specimen: Nasopharyngeal Swab; Nasopharyngeal(NP) swabs in vial transport medium  Result Value Ref Range Status   SARS Coronavirus 2 by RT PCR NEGATIVE NEGATIVE Final    Comment: (NOTE) SARS-CoV-2 target nucleic acids are NOT DETECTED.  The SARS-CoV-2 RNA is generally detectable in upper respiratory specimens during the acute phase of infection. The lowest concentration of SARS-CoV-2 viral copies this assay can detect is 138 copies/mL. A negative result does not preclude SARS-Cov-2 infection and should not be used as the sole basis for treatment or other patient management decisions. A negative result may occur with  improper specimen collection/handling, submission of specimen other than nasopharyngeal swab, presence of viral mutation(s) within the areas targeted by this assay, and inadequate number of viral copies(<138 copies/mL). A negative result must be combined with clinical observations, patient history, and epidemiological information. The expected result is Negative.  Fact Sheet for Patients:  BloggerCourse.com  Fact Sheet for Healthcare Providers:  SeriousBroker.it  This test is no t yet approved or cleared by the [place] FDA and  has been authorized for detection and/or diagnosis of SARS-CoV-2 by FDA under an Emergency Use Authorization (EUA). This EUA will remain   in effect (meaning this test can be used) for the duration of the COVID-19 declaration under Section 564(b)(1) of the Act, 21 U.S.C.section 360bbb-3(b)(1), unless the authorization is terminated  or revoked sooner.       Influenza A by PCR NEGATIVE NEGATIVE Final   Influenza B by PCR NEGATIVE NEGATIVE Final    Comment: (NOTE) The Xpert Xpress SARS-CoV-2/FLU/RSV plus assay is intended as an aid in the diagnosis of influenza from Nasopharyngeal swab specimens and should not be used as a sole basis for treatment. Nasal washings and aspirates are unacceptable for Xpert Xpress SARS-CoV-2/FLU/RSV testing.  Fact Sheet for Patients: BloggerCourse.com  Fact Sheet for Healthcare Providers: SeriousBroker.it  This test is not yet approved or cleared by the [place] FDA and has been authorized for detection and/or diagnosis of SARS-CoV-2 by FDA under an Emergency Use Authorization (EUA). This EUA will remain in effect (meaning this test can be used) for the duration of the COVID-19 declaration under Section 564(b)(1) of the Act, 21 U.S.C. section 360bbb-3(b)(1), unless the authorization is terminated or revoked.  Performed at [facility], [address], [place], [place] [address]      Radiology Studies: No results found.   Pamella Pert, MD, PhD [facility]  Between 7 am - 7 pm I am available, please contact me via Amion (for emergencies) or Securechat (non urgent messages)  Between 7 pm - 7 am I am not available, please contact night coverage MD/APP via Amion

## 2021-11-01 NOTE — Progress Notes (Signed)
Initial Nutrition Assessment ? ?DOCUMENTATION CODES:  ? ?Severe malnutrition in context of acute illness/injury ? ?INTERVENTION:  ? ?Multivitamin w/ minerals daily ?Boost Breeze po TID, each supplement provides 250 kcal and 9 grams of protein ? ?NUTRITION DIAGNOSIS:  ? ?Severe Malnutrition related to acute illness (GI Bleed) as evidenced by moderate muscle depletion, moderate fat depletion, percent weight loss. ? ?GOAL:  ? ?Patient will meet greater than or equal to 90% of their needs ? ?MONITOR:  ? ?PO intake, Supplement acceptance, Diet advancement, Weight trends ? ?REASON FOR ASSESSMENT:  ? ?Malnutrition Screening Tool ?  ? ?ASSESSMENT:  ? ?76 y.o. male presented to the ED with shortness of breath, fatigue, and black tarry stools. PMH includes GERD, COPD, HTN, and R lung lobectomy. Pt admitted with upper GI bleed and anemia 2/2 to bleed.  ? ?03/01 - EGD ?03/02 - diet advanced to clear liquids ? ?Pt reports that he has not had an appetite for 1-2 months, states that he has just been eating less food. Pt reports a typical intake includes: ?Breakfast: cereal and fruit cocktail cup ?"2nd Breakfast: egg, sausage, toast ?Lunch: skipped ?Dinner: did not provide ?Pt reports that he has started to skip his 2nd Breakfast and lunch and eating less at dinner time.  ? ?Pt reports a UBW of 150#, states that he has been steady around that weight for years. Pt endorses 10# weight loss since December due to not eating well. Pt reports that he does not use any assistance with ambulating. Pt reports that he is still very active and enjoys being outdoors.  ?Per EMR, pt has had a 14% weight loss within 2 months, which is clinically significant for time frame. Although, due to pt chronic illness, unable to determine if weight loss is fluid related or actual dry weight loss.   ? ?Discussed ONS with pt. Pt denies the use of ONS at home, pt hoping to start when discharge. RD will add to discharge instructions. Pt agreeable to ONS while  here. Discussed that as his diet advances that we will transition to additional supplements.  ? ?Pt had questions about the menu. Reviewed pt's current diet and the menu.  ? ?Pt does have malnutrition related to acute GI bleed, but does suspect that there may be a chronic component related to COPD.  ? ?Medications reviewed and include: Protonix ?Labs reviewed. ?  ?NUTRITION - FOCUSED PHYSICAL EXAM: ? ?Flowsheet Row Most Recent Value  ?Orbital Region Moderate depletion  ?Upper Arm Region Severe depletion  ?Thoracic and Lumbar Region Moderate depletion  ?Buccal Region Moderate depletion  ?Temple Region Moderate depletion  ?Clavicle Bone Region Moderate depletion  ?Clavicle and Acromion Bone Region Moderate depletion  ?Scapular Bone Region Moderate depletion  ?Dorsal Hand Mild depletion  ?Patellar Region No depletion  ?Anterior Thigh Region No depletion  ?Posterior Calf Region No depletion  ?Edema (RD Assessment) None  ?Hair Reviewed  ?Eyes Reviewed  ?Mouth Reviewed  ?Skin Reviewed  ?Nails Reviewed  ? ?Diet Order:   ?Diet Order   ? ?       ?  Diet clear liquid Room service appropriate? Yes; Fluid consistency: Thin  Diet effective now       ?  ? ?  ?  ? ?  ? ? ?EDUCATION NEEDS:  ? ?No education needs have been identified at this time ? ?Skin:  Skin Assessment: Reviewed RN Assessment ? ?Last BM:  3/1 ? ?Height:  ? ?Ht Readings from Last 1 Encounters:  ?10/31/21 5'  7" (1.702 m)  ? ?Weight:  ? ?Wt Readings from Last 1 Encounters:  ?11/01/21 61.5 kg  ? ?Ideal Body Weight:  67.3 kg ? ?BMI:  Body mass index is 21.22 kg/m?. ? ?Estimated Nutritional Needs:  ? ?Kcal:  1900-2100 ? ?Protein:  95-110 grams ? ?Fluid:  >/= 1.9 L ? ? ? ?Hermina Barters RD, LDN ?Clinical Dietitian ?See AMiON for contact information.  ? ?

## 2021-11-01 NOTE — Progress Notes (Signed)
11/01/2021 @ 1101, on floor Pharmacist contacted twice regarding pantoprozole (PROTONIX) 80 mg /NS 100 mL infusion medication. Pharmacy will make sure that medication is in process so we can deliver this to the Pt.  ? ? ? ? ? ? ? ?

## 2021-11-01 NOTE — Discharge Instructions (Signed)
Nutrition Post Hospital Stay °Proper nutrition can help your body recover from illness and injury.   °Foods and beverages high in protein, vitamins, and minerals help rebuild muscle loss, promote healing, & reduce fall risk.  ° °•In addition to eating healthy foods, a nutrition shake is an easy, delicious way to get the nutrition you need during and after your hospital stay ° °It is recommended that you continue to drink 2 bottles per day of:       Ensure Plus for at least 1 month (30 days) after your hospital stay  ° °Tips for adding a nutrition shake into your routine: °As allowed, drink one with vitamins or medications instead of water or juice °Enjoy one as a tasty mid-morning or afternoon snack °Drink cold or make a milkshake out of it °Drink one instead of milk with cereal or snacks °Use as a coffee creamer °  °Available at the following grocery stores and pharmacies:           °* [employer] * [employer] * [employer]  °* [employer]          * [employer] * [employer]  °* [facility]      * Target  * [employer]   °* [employer]  * [employer]   °* [facility] [phone]  °          °For COUPONS visit: [email or web address]/join or [email or web address]  ° °Suggested Substitutions °Ensure Plus = Boost Plus = Carnation Breakfast Essentials = Boost Compact ° ° °  ° °

## 2021-11-02 DIAGNOSIS — K649 Unspecified hemorrhoids: Secondary | ICD-10-CM

## 2021-11-02 DIAGNOSIS — E43 Unspecified severe protein-calorie malnutrition: Secondary | ICD-10-CM | POA: Insufficient documentation

## 2021-11-02 DIAGNOSIS — K260 Acute duodenal ulcer with hemorrhage: Principal | ICD-10-CM

## 2021-11-02 LAB — CBC
HCT: 26.8 % — ABNORMAL LOW (ref 39.0–52.0)
Hemoglobin: 9 g/dL — ABNORMAL LOW (ref 13.0–17.0)
MCH: 32 pg (ref 26.0–34.0)
MCHC: 33.6 g/dL (ref 30.0–36.0)
MCV: 95.4 fL (ref 80.0–100.0)
Platelets: 249 10*3/uL (ref 150–400)
RBC: 2.81 MIL/uL — ABNORMAL LOW (ref 4.22–5.81)
RDW: 15.3 % (ref 11.5–15.5)
WBC: 6.6 10*3/uL (ref 4.0–10.5)
nRBC: 0 % (ref 0.0–0.2)

## 2021-11-02 LAB — BASIC METABOLIC PANEL
Anion gap: 8 (ref 5–15)
BUN: 10 mg/dL (ref 8–23)
CO2: 24 mmol/L (ref 22–32)
Calcium: 8.3 mg/dL — ABNORMAL LOW (ref 8.9–10.3)
Chloride: 107 mmol/L (ref 98–111)
Creatinine, Ser: 0.71 mg/dL (ref 0.61–1.24)
GFR, Estimated: >60 mL/min (ref >60)
Glucose, Bld: 108 mg/dL — ABNORMAL HIGH (ref 70–99)
Potassium: 3.4 mmol/L — ABNORMAL LOW (ref 3.5–5.1)
Sodium: 139 mmol/L (ref 135–145)

## 2021-11-02 LAB — HEMOGLOBIN AND HEMATOCRIT, BLOOD
HCT: 27.5 % — ABNORMAL LOW (ref 39.0–52.0)
Hemoglobin: 9 g/dL — ABNORMAL LOW (ref 13.0–17.0)

## 2021-11-02 LAB — H. PYLORI ANTIBODY, IGG: H Pylori IgG: 0.11 Index Value (ref 0.00–0.79)

## 2021-11-02 MED ORDER — HYDROCORTISONE (PERIANAL) 2.5 % EX CREA
TOPICAL_CREAM | Freq: Two times a day (BID) | CUTANEOUS | Status: DC
  Administered 2021-11-04: 1 via RECTAL
  Filled 2021-11-02 (×2): qty 28.35

## 2021-11-02 NOTE — Plan of Care (Signed)

## 2021-11-02 NOTE — Progress Notes (Signed)
PROGRESS NOTE  [name] OZH:[id number] DOB: 1946/04/11 DOA: 10/30/2021 PCP: Mosie Lukes, MD   LOS: 3 days   Brief Narrative / Interim history: 76 year old male with history of osteoarthritis, DDD, COPD, history of right upper limb middle bilobectomy for bronchiectasis and hemoptysis in 1996, tobacco use, who comes into the hospital with black tarry stools, shortness of breath, fatigue.  He also reports vomiting bright red blood.  He was found to have anemia, was given blood transfusions and GI was consulted.  Subjective / 24h Interval events: Complains of mild abdominal cramping.  Still having dark stools and so little bit of bright red this morning.  Reports that he has occasionally this at home, thinks he has hemorrhoids  Assesement and Plan: Principal Problem:   Acute upper GI bleed Active Problems:   Benign essential HTN   COPD GOLD II D   Anemia due to acute blood loss   DDD (degenerative disc disease), lumbar   Osteoarthritis   Acute duodenal ulcer with hemorrhage   Protein-calorie malnutrition, severe   Assessment and Plan: Principal problem Acute upper GI bleed, melena-GI consulted, appreciate input.  Hemoglobin was 5.7 on admission, received a total of 3 units of red blood cells.  Underwent an EGD on 3/1 which showed 1 nonbleeding cratered duodenal ulcer with a visible vessel status post epinephrine injection as well as unsuccessful APC.  He also had a hemostatic spray deployed.  The bleeding appeared to have stopped however this cannot be confirmed endoscopically and GI recommended close monitoring in the inpatient setting.  Fortunately hemoglobin improving today.  He is tolerating clear liquids.  Further care per GI, anticipate home on Saturday  Active problems Anemia due to acute blood loss -due to #1, status post 2 units of packed red blood cells.  Continue to monitor hemoglobin, improving on its own at 9.0 this morning  Benign essential HTN-continue as  needed's   COPD GOLD II D-Continue inhalers.  Respiratory status stable.  History of bilobectomy by cardiac surgery noted   DDD (degenerative disc disease), lumbar -Hold Mobic and prednisone with acute GI bleeding.    Osteoarthritis -Pain control provided.  Abnormal EKG, bigeminy-known issue, he occasionally gets tachycardic.  Asymptomatic, follows up as an outpatient  Scheduled Meds:  feeding supplement  1 Container Oral TID BM   fluticasone furoate-vilanterol  1 puff Inhalation Daily   And   umeclidinium bromide  1 puff Inhalation Daily   hydrocortisone   Rectal BID   lidocaine  2 patch Transdermal Q24H   multivitamin with minerals  1 tablet Oral Daily   [START ON 11/03/2021] pantoprazole  40 mg Intravenous Q12H   Continuous Infusions:  lactated ringers 100 mL/hr at 11/01/21 1847   pantoprazole 8 mg/hr (11/02/21 0028)   PRN Meds:.acetaminophen **OR** acetaminophen, albuterol, HYDROmorphone (DILAUDID) injection, ipratropium-albuterol, metoprolol tartrate, ondansetron **OR** ondansetron (ZOFRAN) IV  Diet Orders (From admission, onward)     Start     Ordered   11/01/21 0948  Diet clear liquid Room service appropriate? Yes; Fluid consistency: Thin  Diet effective now       Question Answer Comment  Room service appropriate? Yes   Fluid consistency: Thin      11/01/21 0947            DVT prophylaxis: SCDs Start: 10/30/21 2028   Lab Results  Component Value Date   PLT 249 11/02/2021      Code Status: Full Code  Family Communication: no family at bedside  Status  is: Inpatient  Remains inpatient appropriate because: Severity of illness  Level of care: Progressive  Consultants:  GI  Procedures:  none  Microbiology  none  Antimicrobials: none    Objective: Vitals:   11/02/21 0007 11/02/21 0419 11/02/21 0809 11/02/21 0903  BP: (!) 161/76 (!) 163/82 (!) 148/61   Pulse: 93 (!) 101 (!) 112   Resp: 19 20 (!) 24   Temp: 98.3 F (36.8 C) 98.3 F (36.8 C)     TempSrc: Oral Oral    SpO2: 94% 97% 99% 98%  Weight:  64.4 kg    Height:        Intake/Output Summary (Last 24 hours) at 11/02/2021 1037 Last data filed at 11/02/2021 0841 Gross per 24 hour  Intake 3946.19 ml  Output 2515 ml  Net 1431.19 ml    Wt Readings from Last 3 Encounters:  11/02/21 64.4 kg  10/30/21 64.9 kg  10/16/21 64.9 kg    Examination:  Constitutional: NAD Eyes: lids and conjunctivae normal, no scleral icterus ENMT: mmm Neck: normal, supple Respiratory: clear to auscultation bilaterally, no wheezing, no crackles. Normal respiratory effort.  Cardiovascular: Regular rate and rhythm, no murmurs / rubs / gallops. No LE edema. Abdomen: soft, no distention, no tenderness. Bowel sounds positive.  Skin: no rashes Neurologic: no focal deficits, equal strength   Data Reviewed: I have independently reviewed following labs and imaging studies   CBC Recent Labs  Lab 10/30/21 1642 10/30/21 1812 10/31/21 0259 10/31/21 1919 11/01/21 0343 11/01/21 1654 11/02/21 0626  WBC 10.2  --  10.0 7.9 7.2  --  6.6  HGB 5.7*   < > 7.8* 8.1* 8.6* 8.8* 9.0*  HCT 17.7*   < > 23.2* 24.1* 25.4* 26.3* 26.8*  PLT 266  --  211 239 216  --  249  MCV 99.4  --  96.3 96.0 94.1  --  95.4  MCH 32.0  --  32.4 32.3 31.9  --  32.0  MCHC 32.2  --  33.6 33.6 33.9  --  33.6  RDW 15.5  --  15.2 15.3 15.4  --  15.3   < > = values in this interval not displayed.     Recent Labs  Lab 10/30/21 1642 10/30/21 1812 10/30/21 1826 10/31/21 0259 10/31/21 1919 11/01/21 0343 11/02/21 0626  NA 136 136  --  138  --  139 139  K 3.9 3.8  --  3.9  --  3.5 3.4*  CL 105 106  --  107  --  107 107  CO2 22  --   --  23  --  22 24  GLUCOSE 144* 171*  --  88  --  91 108*  BUN 50* 45*  --  35*  --  15 10  CREATININE 1.05 1.10  --  0.88  --  0.94 0.71  CALCIUM 9.0  --   --  7.9*  --  8.2* 8.3*  AST 19  --   --   --   --  23  --   ALT 15  --   --   --   --  16  --   ALKPHOS 33*  --   --   --   --  33*  --    BILITOT 0.6  --   --   --   --  1.2  --   ALBUMIN 2.6*  --   --   --   --  2.4*  --  MG  --   --   --   --  1.8  --   --   INR  --   --  1.1  --   --   --   --      ------------------------------------------------------------------------------------------------------------------ No results for input(s): CHOL, HDL, LDLCALC, TRIG, CHOLHDL, LDLDIRECT in the last 72 hours.  Lab Results  Component Value Date   HGBA1C 5.7 08/16/2021   ------------------------------------------------------------------------------------------------------------------ No results for input(s): TSH, T4TOTAL, T3FREE, THYROIDAB in the last 72 hours.  Invalid input(s): FREET3  Cardiac Enzymes No results for input(s): CKMB, TROPONINI, MYOGLOBIN in the last 168 hours.  Invalid input(s): CK ------------------------------------------------------------------------------------------------------------------ No results found for: BNP  CBG: No results for input(s): GLUCAP in the last 168 hours.  Recent Results (from the past 240 hour(s))  Resp Panel by RT-PCR (Flu A&B, Covid) Nasopharyngeal Swab     Status: None   Collection Time: 10/30/21  6:04 PM   Specimen: Nasopharyngeal Swab; Nasopharyngeal(NP) swabs in vial transport medium  Result Value Ref Range Status   SARS Coronavirus 2 by RT PCR NEGATIVE NEGATIVE Final    Comment: (NOTE) SARS-CoV-2 target nucleic acids are NOT DETECTED.  The SARS-CoV-2 RNA is generally detectable in upper respiratory specimens during the acute phase of infection. The lowest concentration of SARS-CoV-2 viral copies this assay can detect is 138 copies/mL. A negative result does not preclude SARS-Cov-2 infection and should not be used as the sole basis for treatment or other patient management decisions. A negative result may occur with  improper specimen collection/handling, submission of specimen other than nasopharyngeal swab, presence of viral mutation(s) within the areas  targeted by this assay, and inadequate number of viral copies(<138 copies/mL). A negative result must be combined with clinical observations, patient history, and epidemiological information. The expected result is Negative.  Fact Sheet for Patients:  EntrepreneurPulse.com.au  Fact Sheet for Healthcare Providers:  IncredibleEmployment.be  This test is no t yet approved or cleared by the [place] FDA and  has been authorized for detection and/or diagnosis of SARS-CoV-2 by FDA under an Emergency Use Authorization (EUA). This EUA will remain  in effect (meaning this test can be used) for the duration of the COVID-19 declaration under Section 564(b)(1) of the Act, 21 U.S.C.section 360bbb-3(b)(1), unless the authorization is terminated  or revoked sooner.       Influenza A by PCR NEGATIVE NEGATIVE Final   Influenza B by PCR NEGATIVE NEGATIVE Final    Comment: (NOTE) The Xpert Xpress SARS-CoV-2/FLU/RSV plus assay is intended as an aid in the diagnosis of influenza from Nasopharyngeal swab specimens and should not be used as a sole basis for treatment. Nasal washings and aspirates are unacceptable for Xpert Xpress SARS-CoV-2/FLU/RSV testing.  Fact Sheet for Patients: EntrepreneurPulse.com.au  Fact Sheet for Healthcare Providers: IncredibleEmployment.be  This test is not yet approved or cleared by the [place] FDA and has been authorized for detection and/or diagnosis of SARS-CoV-2 by FDA under an Emergency Use Authorization (EUA). This EUA will remain in effect (meaning this test can be used) for the duration of the COVID-19 declaration under Section 564(b)(1) of the Act, 21 U.S.C. section 360bbb-3(b)(1), unless the authorization is terminated or revoked.  Performed at [facility], [place] [address] [place], [place] [address]      Radiology Studies: No results found.   Marzetta Board,  MD, PhD [facility]  Between 7 am - 7 pm I am available, please contact me via Amion (for emergencies) or  Securechat (non urgent messages)  Between 7 pm - 7 am I am not available, please contact night coverage MD/APP via Amion

## 2021-11-02 NOTE — Care Management Important Message (Signed)
Important Message ? ?Patient Details  ?Name: [name] ?MRN: [id number] ?Date of Birth: 11-Nov-1945 ? ? ?Medicare Important Message Given:  Yes ? ? ? ? ?Shelda Altes ?11/02/2021, 10:27 AM ?

## 2021-11-02 NOTE — Progress Notes (Addendum)
? ? ? ?Progress Note ?Hospital Day: 4 ? ?Chief Complaint:   GI bleed  ?   ? ?ASSESSMENT AND PLAN  ? ? ?Brief History: 76 yo male with COPD, HTN, diverticulosis. Admitted with GI bleed and anemia.  ?  ?# GI bleed / acute anemia but with possibly iron deficiency / Duodenal ulcer with visible vessel s/p epinephrine injection 10/31/21.  ?--TIBC 342, 6 % iron sat, ferritin 32 ?--Was taking Mobic . Awaiting h. Pylori IgG. ?--Black BM with red blood last night and again this am x1. However, some of this may be residual and the bright red blood hemorrhoidal as he does have hemorrhoidal bleeding at home sometimes. On DRE just now he has protruding internal hemorrhoids and scant reddish debris in vault. BP 148 / 61, HR 112 ?--Given above, I doubt he is having a recurrent GI bleed, especially with improvement in hgb to 9. Will monitor today. Continue clear liquids. Treat hemorrhoids with Anusol PR.  ?Hgb improved to 9 today  ?--BID IV PPI ? ? ? [facility] Attending  ? ?I have also seen and evaluated the patient. Await f/u Hgb. At this point doubt recurrent DU bleed but f/u Hgb and clinical course will help [place] understand. I have asked my office staff to go ahead and arrange a 1 month GI f/u. It should populate in dc info when that time arrives. If not then he should hear from [place] or call on Monday to find out if dc over w/e. ? ?Gatha Mayer, MD, [facility] Regal ?[facility] ?11/02/2021 4:04 PM ? ? ? ? ?SUBJECTIVE  ? ?Black BM with red blood last night and once this am. Has hemorrhoid bleeding sometimes. Stool soft. Otherwise feels okay ?    ? ? ?OBJECTIVE  ? ?  ? ?Scheduled inpatient medications:  ? feeding supplement  1 Container Oral TID BM  ? fluticasone furoate-vilanterol  1 puff Inhalation Daily  ? And  ? umeclidinium bromide  1 puff Inhalation Daily  ? lidocaine  2 patch Transdermal Q24H  ? multivitamin with minerals  1 tablet Oral Daily  ? [START ON 11/03/2021] pantoprazole  40 mg Intravenous Q12H  ? ?Continuous inpatient  infusions:  ? lactated ringers 100 mL/hr at 11/01/21 1847  ? pantoprazole 8 mg/hr (11/02/21 0028)  ? ?PRN inpatient medications: acetaminophen **OR** acetaminophen, albuterol, HYDROmorphone (DILAUDID) injection, ipratropium-albuterol, metoprolol tartrate, ondansetron **OR** ondansetron (ZOFRAN) IV ? ?Vital signs in last 24 hours: ?Temp:  [98.2 ?F (36.8 ?C)-99.3 ?F (37.4 ?C)] 98.3 ?F (36.8 ?C) (03/03 0419) ?Pulse Rate:  [93-112] 112 (03/03 0809) ?Resp:  [18-24] 24 (03/03 0809) ?BP: (134-163)/(53-82) 148/61 (03/03 0809) ?SpO2:  [94 %-100 %] 99 % (03/03 0809) ?Weight:  [64.4 kg] 64.4 kg (03/03 0419) ?Last BM Date : 10/31/21 ? ?Intake/Output Summary (Last 24 hours) at 11/02/2021 0845 ?Last data filed at 11/02/2021 [phone] ?Gross per 24 hour  ?Intake 3946.19 ml  ?Output 2665 ml  ?Net 1281.19 ml  ? ? ? ?Physical Exam:  ?General: Alert male in NAD ?Heart:  Regular rate and rhythm. No lower extremity edema ?Pulmonary: Normal respiratory effort ?Abdomen: Soft, nondistended, nontender. Normal bowel sounds. ?Rectal: inflamed protruding internal hemorrhoids. Scant reddish debris in vault  ?Neurologic: Alert and oriented ?Psych: Pleasant. Cooperative.  ? ?Filed Weights  ? 10/31/21 1115 11/01/21 0403 11/02/21 0419  ?Weight: 64.9 kg 61.5 kg 64.4 kg  ? ? ?ntake/Output from previous day: ?03/02 0701 - 03/03 0700 ?In: 3586.2 [P.O.:1500; I.V.:2086.2] ?Out: 2775 [Urine:2775] ?Intake/Output this shift: ?Total I/O ?  In: 360 [P.O.:360] ?Out: 275 [Urine:275] ? ? ?DIAGNOSTIC STUDIES THIS ADMISSION:  ?No results found. ? ? ? ? ?Lab Results: ?Recent Labs  ?  10/31/21 ?1919 11/01/21 ?5436 11/01/21 ?1654 11/02/21 ?0677  ?WBC 7.9 7.2  --  6.6  ?HGB 8.1* 8.6* 8.8* 9.0*  ?HCT 24.1* 25.4* 26.3* 26.8*  ?PLT 239 216  --  249  ? ?BMET ?Recent Labs  ?  10/31/21 ?0259 11/01/21 ?0343 11/02/21 ?0626  ?NA 138 139 139  ?K 3.9 3.5 3.4*  ?CL 107 107 107  ?CO2 23 22 24   ?GLUCOSE 88 91 108*  ?BUN 35* 15 10  ?CREATININE 0.88 0.94 0.71  ?CALCIUM 7.9* 8.2* 8.3*   ? ?LFT ?Recent Labs  ?  11/01/21 ?0343  ?PROT 4.6*  ?ALBUMIN 2.4*  ?AST 23  ?ALT 16  ?ALKPHOS 33*  ?BILITOT 1.2  ? ?PT/INR ?Recent Labs  ?  10/30/21 ?1826  ?LABPROT 13.8  ?INR 1.1  ? ?Hepatitis Panel ?No results for input(s): HEPBSAG, HCVAB, HEPAIGM, HEPBIGM in the last 72 hours. ? ? ? ?Principal Problem: ?  Acute upper GI bleed ?Active Problems: ?  Benign essential HTN ?  COPD GOLD II D ?  Anemia due to acute blood loss ?  DDD (degenerative disc disease), lumbar ?  Osteoarthritis ?  Acute duodenal ulcer with hemorrhage ?  Protein-calorie malnutrition, severe ? ? ? ? LOS: 3 days  ? ?Tye Savoy ,NP 11/02/2021, 8:45 AM ? ? ? ? ? ? ?

## 2021-11-02 NOTE — TOC Progression Note (Signed)
Transition of Care (TOC) - Progression Note  ? ? ?Patient Details  ?Name: [name] ?MRN: [id number] ?Date of Birth: 03-23-1946 ? ?Transition of Care (TOC) CM/SW Contact  ?Zenon Mayo, RN ?Phone Number: ?11/02/2021, 4:43 PM ? ?Clinical Narrative:    ?still with bloody BM, will be here thru Saturday, has LR at 100. Advance to clear.  TOC will continue to follow for dc needs.  ? ? ?  ?  ? ?Expected Discharge Plan and Services ?  ?  ?  ?  ?  ?                ?  ?  ?  ?  ?  ?  ?  ?  ?  ?  ? ? ?Social Determinants of Health (SDOH) Interventions ?Financial Strain Interventions: Other (Comment) (Asked Pt if he wanted me to ask about resourced regarding medications, he declined at this time 11/01/2021) ? ?Readmission Risk Interventions ?No flowsheet data found. ? ?

## 2021-11-02 NOTE — Progress Notes (Signed)
Patient had bowel movement. Unable to collect specimen but frank red blood was noted in the toilet before patient flushed the toilet ?

## 2021-11-02 NOTE — Progress Notes (Signed)
Patient is becoming ST due lower back pain. Gave IV dilaudid 1 mg for the severe pain reported as 7 out of 10 on numeric scale. The lidocaine patches were removed as ordered and only are applied every 24 hours for pain. Patient stated that once a day dosing of patches does not control his pain. ?

## 2021-11-03 DIAGNOSIS — E876 Hypokalemia: Secondary | ICD-10-CM

## 2021-11-03 LAB — CBC
HCT: 27.2 % — ABNORMAL LOW (ref 39.0–52.0)
Hemoglobin: 9.2 g/dL — ABNORMAL LOW (ref 13.0–17.0)
MCH: 32.1 pg (ref 26.0–34.0)
MCHC: 33.8 g/dL (ref 30.0–36.0)
MCV: 94.8 fL (ref 80.0–100.0)
Platelets: 263 10*3/uL (ref 150–400)
RBC: 2.87 MIL/uL — ABNORMAL LOW (ref 4.22–5.81)
RDW: 15 % (ref 11.5–15.5)
WBC: 6.2 10*3/uL (ref 4.0–10.5)
nRBC: 0 % (ref 0.0–0.2)

## 2021-11-03 LAB — HEMOGLOBIN AND HEMATOCRIT, BLOOD
HCT: 30.4 % — ABNORMAL LOW (ref 39.0–52.0)
Hemoglobin: 9.8 g/dL — ABNORMAL LOW (ref 13.0–17.0)

## 2021-11-03 LAB — BASIC METABOLIC PANEL
Anion gap: 8 (ref 5–15)
BUN: 8 mg/dL (ref 8–23)
CO2: 24 mmol/L (ref 22–32)
Calcium: 8.4 mg/dL — ABNORMAL LOW (ref 8.9–10.3)
Chloride: 105 mmol/L (ref 98–111)
Creatinine, Ser: 0.71 mg/dL (ref 0.61–1.24)
GFR, Estimated: >60 mL/min (ref >60)
Glucose, Bld: 109 mg/dL — ABNORMAL HIGH (ref 70–99)
Potassium: 3.3 mmol/L — ABNORMAL LOW (ref 3.5–5.1)
Sodium: 137 mmol/L (ref 135–145)

## 2021-11-03 MED ORDER — POTASSIUM CHLORIDE 20 MEQ PO PACK
40.0000 meq | PACK | Freq: Once | ORAL | Status: AC
Start: 2021-11-03 — End: 2021-11-03
  Administered 2021-11-03: 40 meq via ORAL
  Filled 2021-11-03: qty 2

## 2021-11-03 NOTE — Plan of Care (Signed)

## 2021-11-03 NOTE — Progress Notes (Signed)
Subjective: ?No complaints.  No reports of any melena or hematochezia. ? ?Objective: ?Vital signs in last 24 hours: ?Temp:  [98.3 ?F (36.8 ?C)-98.9 ?F (37.2 ?C)] 98.3 ?F (36.8 ?C) (03/04 0507) ?Pulse Rate:  [100-112] 100 (03/04 0507) ?Resp:  [18-27] 21 (03/04 0507) ?BP: (142-168)/(61-87) 143/80 (03/04 0507) ?SpO2:  [94 %-99 %] 94 % (03/04 0507) ?Weight:  [64.1 kg] 64.1 kg (03/04 0002) ?Last BM Date : 10/31/21 ? ?Intake/Output from previous day: ?03/03 0701 - 03/04 0700 ?In: 974 [P.O.:974] ?Out: 2325 [Urine:2325] ?Intake/Output this shift: ?Total I/O ?In: -  ?Out: 0626 [Urine:1450] ? ?General appearance: alert and no distress ?GI: soft, non-tender; bowel sounds normal; no masses,  no organomegaly ? ?Lab Results: ?Recent Labs  ?  11/01/21 ?9485 11/01/21 ?1654 11/02/21 ?0626 11/02/21 ?1636 11/03/21 ?0448  ?WBC 7.2  --  6.6  --  6.2  ?HGB 8.6*   < > 9.0* 9.0* 9.2*  ?HCT 25.4*   < > 26.8* 27.5* 27.2*  ?PLT 216  --  249  --  263  ? < > = values in this interval not displayed.  ? ?BMET ?Recent Labs  ?  11/01/21 ?0343 11/02/21 ?0626 11/03/21 ?0448  ?NA 139 139 137  ?K 3.5 3.4* 3.3*  ?CL 107 107 105  ?CO2 '22 24 24  '$ ?GLUCOSE 91 108* 109*  ?BUN '15 10 8  '$ ?CREATININE 0.94 0.71 0.71  ?CALCIUM 8.2* 8.3* 8.4*  ? ?LFT ?Recent Labs  ?  11/01/21 ?0343  ?PROT 4.6*  ?ALBUMIN 2.4*  ?AST 23  ?ALT 16  ?ALKPHOS 33*  ?BILITOT 1.2  ? ?PT/INR ?No results for input(s): LABPROT, INR in the last 72 hours. ?Hepatitis Panel ?No results for input(s): HEPBSAG, HCVAB, HEPAIGM, HEPBIGM in the last 72 hours. ?C-Diff ?No results for input(s): CDIFFTOX in the last 72 hours. ?Fecal Lactopherrin ?No results for input(s): FECLLACTOFRN in the last 72 hours. ? ?Studies/Results: ?No results found. ? ?Medications: Scheduled: ? feeding supplement  1 Container Oral TID BM  ? fluticasone furoate-vilanterol  1 puff Inhalation Daily  ? And  ? umeclidinium bromide  1 puff Inhalation Daily  ? hydrocortisone   Rectal BID  ? lidocaine  2 patch Transdermal Q24H  ?  multivitamin with minerals  1 tablet Oral Daily  ? pantoprazole  40 mg Intravenous Q12H  ? ?Continuous: ? lactated ringers 100 mL/hr at 11/02/21 1457  ? ? ?Assessment/Plan: ?1) Large D2 ulceration with visible vessel s/p Epi, BICAP, and hemospray. ?2) Anemia - Stable. ? ? The patient does not have any gross signs of bleeding and his HGB is stable.  Today's value is at 9.2 g/dL.  Yesterday's value was at 9.0 g/dL. ? ?Plan: ?1) Advance to a regular diet. ?2) Follow HGB and transfuse as necessary. ?3) If no bleeding by tomorrow he can be discharged home and follow up with Dr. Carlean Purl in 1 month. ? LOS: 4 days  ? ?[name] D ?11/03/2021, 7:00 AM  ?

## 2021-11-03 NOTE — Progress Notes (Signed)
PROGRESS NOTE  [name] LGX:[id number] DOB: 07/22/1946 DOA: 10/30/2021 PCP: Mosie Lukes, MD   LOS: 4 days   Brief Narrative / Interim history: 76 year old male with history of osteoarthritis, DDD, COPD, history of right upper limb middle bilobectomy for bronchiectasis and hemoptysis in 1996, tobacco use, who comes into the hospital with black tarry stools, shortness of breath, fatigue.  He also reports vomiting bright red blood.  He was found to have anemia, was given blood transfusions and GI was consulted.  Subjective / 24h Interval events: Feels much better this morning, GI advanced into a regular diet and he is enjoying his breakfast.  No further bowel movements  Assesement and Plan: Principal Problem:   Acute upper GI bleed Active Problems:   Benign essential HTN   COPD GOLD II D   Anemia due to acute blood loss   DDD (degenerative disc disease), lumbar   Osteoarthritis   Acute duodenal ulcer with hemorrhage   Protein-calorie malnutrition, severe   Bleeding hemorrhoids   Assessment and Plan: Principal problem Acute upper GI bleed, melena-GI consulted, appreciate input.  Hemoglobin was 5.7 on admission, received a total of 3 units of red blood cells.  Underwent an EGD on 3/1 which showed 1 nonbleeding cratered duodenal ulcer with a visible vessel status post epinephrine injection as well as unsuccessful APC.  He also had a hemostatic spray deployed.  The bleeding appeared to have stopped however this cannot be confirmed endoscopically and GI recommended close monitoring in the inpatient setting.  He continues to clinically improve, hemoglobin 9.2 this morning.  Per GI, he remained stable and has no evidence of further bleeding could be discharged home tomorrow  Active problems Anemia due to acute blood loss -due to #1, status post 2 units of packed red blood cells.  Continue to monitor hemoglobin, improving on its own at 9.0 this morning  Hypokalemia-replete potassium  today  Benign essential HTN-continue medications as below   COPD GOLD II D-Continue inhalers.  Respiratory status stable.  History of bilobectomy by cardiac surgery noted   DDD (degenerative disc disease), lumbar -Hold Mobic and prednisone with acute GI bleeding.    Osteoarthritis -Pain control provided.  Abnormal EKG, bigeminy-known issue, he occasionally gets tachycardic.  Asymptomatic, follows up as an outpatient  Scheduled Meds:  feeding supplement  1 Container Oral TID BM   fluticasone furoate-vilanterol  1 puff Inhalation Daily   And   umeclidinium bromide  1 puff Inhalation Daily   hydrocortisone   Rectal BID   lidocaine  2 patch Transdermal Q24H   multivitamin with minerals  1 tablet Oral Daily   pantoprazole  40 mg Intravenous Q12H   Continuous Infusions:  lactated ringers 100 mL/hr at 11/02/21 1457   PRN Meds:.acetaminophen **OR** acetaminophen, albuterol, HYDROmorphone (DILAUDID) injection, ipratropium-albuterol, metoprolol tartrate, ondansetron **OR** ondansetron (ZOFRAN) IV  Diet Orders (From admission, onward)     Start     Ordered   11/03/21 0627  Diet regular Room service appropriate? Yes; Fluid consistency: Thin  Diet effective now       Question Answer Comment  Room service appropriate? Yes   Fluid consistency: Thin      11/03/21 0626            DVT prophylaxis: SCDs Start: 10/30/21 2028   Lab Results  Component Value Date   PLT 263 11/03/2021      Code Status: Full Code  Family Communication: no family at bedside  Status is: Inpatient  Remains  inpatient appropriate because: Severity of illness  Level of care: Progressive  Consultants:  GI  Procedures:  none  Microbiology  none  Antimicrobials: none    Objective: Vitals:   11/03/21 0002 11/03/21 0507 11/03/21 0741 11/03/21 0743  BP: (!) 142/71 (!) 143/80  (!) 165/83  Pulse: (!) 105 100  87  Resp: 18 (!) 21  16  Temp: 98.3 F (36.8 C) 98.3 F (36.8 C)  97.9 F (36.6  C)  TempSrc: Oral Oral  Oral  SpO2: 96% 94% 95% 96%  Weight: 64.1 kg     Height:        Intake/Output Summary (Last 24 hours) at 11/03/2021 1031 Last data filed at 11/03/2021 1027 Gross per 24 hour  Intake 774 ml  Output 2350 ml  Net -1576 ml    Wt Readings from Last 3 Encounters:  11/03/21 64.1 kg  10/30/21 64.9 kg  10/16/21 64.9 kg    Examination:  Constitutional: NAD Eyes: lids and conjunctivae normal, no scleral icterus ENMT: mmm Neck: normal, supple Respiratory: clear to auscultation bilaterally, no wheezing, no crackles. Normal respiratory effort.  Cardiovascular: Regular rate and rhythm, no murmurs / rubs / gallops. No LE edema. Abdomen: soft, no distention, no tenderness. Bowel sounds positive.  Skin: no rashes Neurologic: no focal deficits, equal strength   Data Reviewed: I have independently reviewed following labs and imaging studies   CBC Recent Labs  Lab 10/31/21 0259 10/31/21 1919 11/01/21 0343 11/01/21 1654 11/02/21 0626 11/02/21 1636 11/03/21 0448  WBC 10.0 7.9 7.2  --  6.6  --  6.2  HGB 7.8* 8.1* 8.6* 8.8* 9.0* 9.0* 9.2*  HCT 23.2* 24.1* 25.4* 26.3* 26.8* 27.5* 27.2*  PLT 211 239 216  --  249  --  263  MCV 96.3 96.0 94.1  --  95.4  --  94.8  MCH 32.4 32.3 31.9  --  32.0  --  32.1  MCHC 33.6 33.6 33.9  --  33.6  --  33.8  RDW 15.2 15.3 15.4  --  15.3  --  15.0     Recent Labs  Lab 10/30/21 1642 10/30/21 1812 10/30/21 1826 10/31/21 0259 10/31/21 1919 11/01/21 0343 11/02/21 0626 11/03/21 0448  NA 136 136  --  138  --  139 139 137  K 3.9 3.8  --  3.9  --  3.5 3.4* 3.3*  CL 105 106  --  107  --  107 107 105  CO2 22  --   --  23  --  '22 24 24  '$ GLUCOSE 144* 171*  --  88  --  91 108* 109*  BUN 50* 45*  --  35*  --  '15 10 8  '$ CREATININE 1.05 1.10  --  0.88  --  0.94 0.71 0.71  CALCIUM 9.0  --   --  7.9*  --  8.2* 8.3* 8.4*  AST 19  --   --   --   --  23  --   --   ALT 15  --   --   --   --  16  --   --   ALKPHOS 33*  --   --   --   --   33*  --   --   BILITOT 0.6  --   --   --   --  1.2  --   --   ALBUMIN 2.6*  --   --   --   --  2.4*  --   --  MG  --   --   --   --  1.8  --   --   --   INR  --   --  1.1  --   --   --   --   --      ------------------------------------------------------------------------------------------------------------------ No results for input(s): CHOL, HDL, LDLCALC, TRIG, CHOLHDL, LDLDIRECT in the last 72 hours.  Lab Results  Component Value Date   HGBA1C 5.7 08/16/2021   ------------------------------------------------------------------------------------------------------------------ No results for input(s): TSH, T4TOTAL, T3FREE, THYROIDAB in the last 72 hours.  Invalid input(s): FREET3  Cardiac Enzymes No results for input(s): CKMB, TROPONINI, MYOGLOBIN in the last 168 hours.  Invalid input(s): CK ------------------------------------------------------------------------------------------------------------------ No results found for: BNP  CBG: No results for input(s): GLUCAP in the last 168 hours.  Recent Results (from the past 240 hour(s))  Resp Panel by RT-PCR (Flu A&B, Covid) Nasopharyngeal Swab     Status: None   Collection Time: 10/30/21  6:04 PM   Specimen: Nasopharyngeal Swab; Nasopharyngeal(NP) swabs in vial transport medium  Result Value Ref Range Status   SARS Coronavirus 2 by RT PCR NEGATIVE NEGATIVE Final    Comment: (NOTE) SARS-CoV-2 target nucleic acids are NOT DETECTED.  The SARS-CoV-2 RNA is generally detectable in upper respiratory specimens during the acute phase of infection. The lowest concentration of SARS-CoV-2 viral copies this assay can detect is 138 copies/mL. A negative result does not preclude SARS-Cov-2 infection and should not be used as the sole basis for treatment or other patient management decisions. A negative result may occur with  improper specimen collection/handling, submission of specimen other than nasopharyngeal swab, presence of viral  mutation(s) within the areas targeted by this assay, and inadequate number of viral copies(<138 copies/mL). A negative result must be combined with clinical observations, patient history, and epidemiological information. The expected result is Negative.  Fact Sheet for Patients:  EntrepreneurPulse.com.au  Fact Sheet for Healthcare Providers:  IncredibleEmployment.be  This test is no t yet approved or cleared by the [place] FDA and  has been authorized for detection and/or diagnosis of SARS-CoV-2 by FDA under an Emergency Use Authorization (EUA). This EUA will remain  in effect (meaning this test can be used) for the duration of the COVID-19 declaration under Section 564(b)(1) of the Act, 21 U.S.C.section 360bbb-3(b)(1), unless the authorization is terminated  or revoked sooner.       Influenza A by PCR NEGATIVE NEGATIVE Final   Influenza B by PCR NEGATIVE NEGATIVE Final    Comment: (NOTE) The Xpert Xpress SARS-CoV-2/FLU/RSV plus assay is intended as an aid in the diagnosis of influenza from Nasopharyngeal swab specimens and should not be used as a sole basis for treatment. Nasal washings and aspirates are unacceptable for Xpert Xpress SARS-CoV-2/FLU/RSV testing.  Fact Sheet for Patients: EntrepreneurPulse.com.au  Fact Sheet for Healthcare Providers: IncredibleEmployment.be  This test is not yet approved or cleared by the [place] FDA and has been authorized for detection and/or diagnosis of SARS-CoV-2 by FDA under an Emergency Use Authorization (EUA). This EUA will remain in effect (meaning this test can be used) for the duration of the COVID-19 declaration under Section 564(b)(1) of the Act, 21 U.S.C. section 360bbb-3(b)(1), unless the authorization is terminated or revoked.  Performed at [facility], [place] [address], [place], [place] [address]      Radiology Studies: No results  found.   Marzetta Board, MD, PhD [facility]  Between 7 am - 7 pm I am available, please contact  me via Amion (for emergencies) or Securechat (non urgent messages)  Between 7 pm - 7 am I am not available, please contact night coverage MD/APP via Amion

## 2021-11-03 NOTE — Progress Notes (Signed)
Mobility Specialist Progress Note: ? ? 11/03/21 1555  ?Mobility  ?Activity Ambulated independently in hallway  ?Level of Assistance Independent  ?Assistive Device None  ?Distance Ambulated (ft) 450 ft  ?Activity Response Tolerated well  ?$Mobility charge 1 Mobility  ? ?Pt asx during ambulation. Sitting in chair with all needs met.  ? ?Nelta Numbers ?Acute Rehab ?Phone: [phone] ?Office Phone: [phone] ? ?

## 2021-11-03 NOTE — Progress Notes (Signed)
Patient remained alert and oriented for the entire shift. Pain medication given once during shift. Pt ambulated in the hallway with mobility specialist Anna. IV fluids were discontinued and patient continued to void in the urinal. Pt resting in chair watching TV and is scheduled to discharge home tomorrow. ?

## 2021-11-04 DIAGNOSIS — I10 Essential (primary) hypertension: Secondary | ICD-10-CM

## 2021-11-04 DIAGNOSIS — E876 Hypokalemia: Secondary | ICD-10-CM

## 2021-11-04 DIAGNOSIS — R9431 Abnormal electrocardiogram [ECG] [EKG]: Secondary | ICD-10-CM

## 2021-11-04 DIAGNOSIS — M5136 Other intervertebral disc degeneration, lumbar region: Secondary | ICD-10-CM

## 2021-11-04 DIAGNOSIS — J449 Chronic obstructive pulmonary disease, unspecified: Secondary | ICD-10-CM

## 2021-11-04 DIAGNOSIS — M1990 Unspecified osteoarthritis, unspecified site: Secondary | ICD-10-CM

## 2021-11-04 DIAGNOSIS — K921 Melena: Secondary | ICD-10-CM

## 2021-11-04 DIAGNOSIS — R008 Other abnormalities of heart beat: Secondary | ICD-10-CM

## 2021-11-04 LAB — HEMOGLOBIN AND HEMATOCRIT, BLOOD
HCT: 27.1 % — ABNORMAL LOW (ref 39.0–52.0)
Hemoglobin: 8.6 g/dL — ABNORMAL LOW (ref 13.0–17.0)

## 2021-11-04 MED ORDER — VITAMIN B-12 1000 MCG PO TABS: 1000.0000 ug | ORAL_TABLET | Freq: Every day | ORAL | 0 refills | Status: DC

## 2021-11-04 MED ORDER — FERROUS SULFATE 325 (65 FE) MG PO TABS: 325.0000 mg | ORAL_TABLET | Freq: Every day | ORAL | 0 refills | Status: DC

## 2021-11-04 MED ORDER — HYDROMORPHONE HCL 2 MG PO TABS
2.0000 mg | ORAL_TABLET | Freq: Four times a day (QID) | ORAL | 0 refills | Status: DC | PRN
Start: 2021-11-04 — End: 2021-11-06

## 2021-11-04 MED ORDER — PANTOPRAZOLE SODIUM 40 MG PO TBEC: 40.0000 mg | DELAYED_RELEASE_TABLET | Freq: Two times a day (BID) | ORAL | 1 refills | Status: DC

## 2021-11-04 NOTE — Discharge Summary (Signed)
Physician Discharge Summary  [name] OMV:[id number] DOB: August 12, 1946 DOA: 10/30/2021  PCP: Mosie Lukes, MD  Admit date: 10/30/2021 Discharge date: 11/04/2021  Admitted From: home Disposition:  home  Recommendations for Outpatient Follow-up:  Follow up with Dr Carlean Purl in 1-2 weeks Please obtain BMP/CBC in one week  Home Health: none Equipment/Devices: none  Discharge Condition: stable CODE STATUS: Full code Diet recommendation: regular  HPI: Per admitting MD, [name] is a 76 y.o. male with medical history significant of OA, DDD cervical and lumbar spine, COPD, HTN, GERD who presents for evaluation of black tarry stools with shortness of breath with exertion and fatigue over the last few days.  He was at his cardiothoracic surgeon having follow-up appointment for his previous lobectomy.  He underwent a CT scan of his chest.  While he was at the appointment he mentioned having black tarry stools for the last 3 to 4 days with increased fatigue and getting short of breath with exertion.  He was sent to the emergency room for further evaluation.  He has been taking Mobic daily as well as prednisone which he decreased to 10 mg from 20 mg 3 days ago.  When he arrived in the emergency room he had an episode of nausea and vomited bright red blood 1 time.  He was given Zofran and nausea has resolved.  He has been having black tarry stool for the last 4 to 5 days which is Hemoccult positive in the emergency room.  He has no prior history of GI bleeding or ulcers.  He had a colonoscopy a year ago and had 1 benign polyp removed at that time he states.  Nuys any abdominal pain.  He has not had fever, chills, urinary symptoms, chest pain.  Denies any episodes of syncope. Has a history of smoking but quit over 20 years ago. In the emergency room he has been tachycardic and was given IV fluids.  Tachycardia improved with IV fluid hydration.  He was found to be significantly anemic with  hemoglobin of 5.7.  Packed red blood cell transfusion has been ordered and started in ER.  ER physician discussed with gastroenterology, Dr. Fuller Plan of [facility], who will see patient in the morning and perform upper endoscopy.  If patient becomes unstable hemodynamically overnight or has recurrent bright red emesis GI will come in tonight and perform EGD emergently.  Hospital Course / Discharge diagnoses: Principal Problem:   Acute upper GI bleed Active Problems:   Benign essential HTN   COPD GOLD II D   Anemia due to acute blood loss   DDD (degenerative disc disease), lumbar   Osteoarthritis   Acute duodenal ulcer with hemorrhage   Protein-calorie malnutrition, severe   Bleeding hemorrhoids   Hypokalemia   Assessment and Plan: Principal problem Acute upper GI bleed, melena-GI consulted, appreciate input.  Hemoglobin was 5.7 on admission, received a total of 3 units of red blood cells.  Underwent an EGD on 3/1 which showed 1 nonbleeding cratered duodenal ulcer with a visible vessel status post epinephrine injection as well as unsuccessful APC.  He also had a hemostatic spray deployed.  The bleeding appeared to have stopped however this cannot be confirmed endoscopically and GI recommended close monitoring in the inpatient setting.  He continues to clinically improve, he has not had any further bleeding, he is tolerating a regular diet, hemoglobin overall stable. Discussed with GI, will dc home in stable condition with outpatient follow up   Active  problems Anemia due to acute blood loss -due to #1, status post 2 units of packed red blood cells. Following initial transfusion his Hb has remained overall stable. Had a slight drop on the day of discharge however was within similar values for the past 48 h and not associated with clinicl signs of ongoing bleed Hypokalemia-replete potassium today Benign essential HTN-continue medications as below COPD GOLD II D-Continue inhalers.  Respiratory  status stable.  History of bilobectomy by cardiac surgery noted DDD (degenerative disc disease), lumbar -Hold Mobic and prednisone with acute GI bleeding. Short course of narcotics on dc, recommend referral to pain clinic.  Osteoarthritis -Pain control provided. Abnormal EKG, bigeminy-known issue, he occasionally gets tachycardic.  Asymptomatic, follows up as an outpatient   Sepsis ruled out   Discharge Instructions   Allergies as of 11/04/2021       Reactions   Losartan Other (See Comments)   weakness        Medication List     STOP taking these medications    doxycycline 100 MG capsule Commonly known as: VIBRAMYCIN   meloxicam 15 MG tablet Commonly known as: MOBIC   predniSONE 20 MG tablet Commonly known as: DELTASONE       TAKE these medications    acetaminophen 650 MG CR tablet Commonly known as: TYLENOL Take 1,300 mg by mouth every 8 (eight) hours as needed for pain.   albuterol 108 (90 Base) MCG/ACT inhaler Commonly known as: VENTOLIN HFA INHALE 2 PUFFS INTO THE LUNGS EVERY 6 HOURS AS NEEDED FOR WHEEZING OR SHORTNESS OF BREATH.   amLODipine 5 MG tablet Commonly known as: NORVASC TAKE 1 TABLET EVERY DAY What changed: additional instructions   benzonatate 100 MG capsule Commonly known as: TESSALON Take 1 capsule (100 mg total) by mouth 3 (three) times daily as needed for cough.   BLUE-EMU MAXIMUM STRENGTH EX Apply 1 application topically daily as needed (arthritis pain).   calcium elemental as carbonate 400 MG chewable tablet Commonly known as: BARIATRIC TUMS ULTRA Chew 1,000 mg by mouth 2 (two) times daily.   cetirizine 10 MG tablet Commonly known as: ZYRTEC Take 10 mg by mouth daily.   diclofenac Sodium 1 % Gel Commonly known as: VOLTAREN Apply 2 g topically 3 (three) times daily as needed (pain).   ferrous sulfate 325 (65 FE) MG tablet Take 1 tablet (325 mg total) by mouth daily.   fluticasone 50 MCG/ACT nasal spray Commonly known as:  FLONASE Place 2 sprays into both nostrils daily.   Glucosamine-Chondroitin 750-600 MG Tabs Take 1 tablet by mouth 2 (two) times daily.   Guaifenesin 1200 MG Tb12 Take 600 mg by mouth 2 (two) times daily.   HYDROmorphone 2 MG tablet Commonly known as: Dilaudid Take 1 tablet (2 mg total) by mouth every 6 (six) hours as needed for up to 3 days for severe pain.   imiquimod 5 % cream Commonly known as: ALDARA Apply topically 3 (three) times a week.   loratadine 10 MG tablet Commonly known as: CLARITIN Take 10 mg by mouth daily.   montelukast 10 MG tablet Commonly known as: SINGULAIR TAKE 1 TABLET EVERY DAY What changed: when to take this   multivitamin with minerals Tabs tablet Take 1 tablet by mouth daily.   pantoprazole 40 MG tablet Commonly known as: Protonix Take 1 tablet (40 mg total) by mouth 2 (two) times daily before a meal.   ROBITUSSIN COUGH DROPS MT Use as directed 1 lozenge in the mouth or throat  4 (four) times daily as needed (cough).   SALONPAS GEL EX Apply 1 application topically daily as needed (arthritis pain).   Trelegy Ellipta 100-62.5-25 MCG/ACT Aepb Generic drug: Fluticasone-Umeclidin-Vilant INHALE 1 PUFF INTO THE LUNGS DAILY   vitamin B-12 1000 MCG tablet Commonly known as: CYANOCOBALAMIN Take 1 tablet (1,000 mcg total) by mouth daily.         Consultations: GI  Procedures/Studies: EGD  CT Chest Wo Contrast  Result Date: 10/30/2021 CLINICAL DATA:  Follow-up lung nodule. History of right upper and right middle lobectomies. EXAM: CT CHEST WITHOUT CONTRAST TECHNIQUE: Multidetector CT imaging of the chest was performed following the standard protocol without IV contrast. RADIATION DOSE REDUCTION: This exam was performed according to the departmental dose-optimization program which includes automated exposure control, adjustment of the mA and/or kV according to patient size and/or use of iterative reconstruction technique. COMPARISON:   05/01/2021 FINDINGS: Cardiovascular: The heart size appears within normal limits. Aortic atherosclerosis with coronary artery calcifications. No pericardial effusion. Mediastinum/Nodes: No enlarged mediastinal or axillary lymph nodes. Thyroid gland, trachea, and esophagus demonstrate no significant findings. Lungs/Pleura: Centrilobular emphysema. Status post right upper and right middle lobectomies. Pleural thickening overlying the apical portion of the right lung is again noted and appears similar to the previous exam, image 38/2. The cavitary component of the pleural thickening has resolved when compared with the previous exam. Similar appearance of pleuroparenchymal scarring within the posterior and lateral right lung base. Stable chronic appearing subpleural collections of gas within the posterior right lung base, image 98/2. The appearance is unchanged compared with 08/29/2020. Upper Abdomen: Aortic atherosclerosis. No acute abnormality within the imaged portions of the upper abdomen. Musculoskeletal: Postop change from ACDF. Previous PLIF noted within the lumbar spine. Degenerative disc disease identified within the lower thoracic spine. No acute or suspicious osseous findings. IMPRESSION: 1. Status post right upper and right middle lobectomies. 2. Stable appearance of pleural thickening overlying the apical portion of the right lung. The cavitary component within this area has resolved in the interval. 3. Stable appearance of pleuroparenchymal scarring within the right lung base with stable chronic areas of loculated pleural gas overlying the posterior left base. 4. Aortic Atherosclerosis (ICD10-I70.0) and Emphysema (ICD10-J43.9). Coronary artery calcifications. Electronically Signed   By: Kerby Moors M.D.   On: 10/30/2021 14:50   DG Chest Portable 1 View  Result Date: 10/30/2021 CLINICAL DATA:  Shortness of breath. Hematemesis. History of right lung partial resection. EXAM: PORTABLE CHEST 1 VIEW  COMPARISON:  Right lung radiographs 09/06/2021 FINDINGS: Cardiac silhouette and mediastinal contours are unchanged and within normal limits. There is again moderate right lung volume loss. Stable right apical thickening. Scarring within the right lung base unchanged. No new focal airspace opacity. No definite pleural effusion. No pneumothorax. ACDF hardware again overlies the cervical spine. High-riding humeral heads bilaterally suggesting full-thickness superior rotator cuff tears. Severe right greater than left glenohumeral osteoarthritis. IMPRESSION:: IMPRESSION: 1. No significant change in chronic right lung volume loss and scarring. 2. No acute lung process. Electronically Signed   By: Yvonne Kendall M.D.   On: 10/30/2021 18:39   XR Lumbar Spine 2-3 Views  Result Date: 10/22/2021 Xray lumbar spine 2 views Hardware in place in lumbar spine L1-L5 with disc spacers. No obvious shift in position. There are prominent anterior osteophytes and some disc height loss with mild anterior height loss in vertebral body at T12. No acute bony abnormality appreciated. Impression No displacement or obvious change in hardware position, extensive lower thoracic spine  degenerative arthritis with prominent osteophyte formation  XR Shoulder Left  Result Date: 10/22/2021 Xray left shoulder 4 views Mild glenohumeral joint space narrowing and cystic changes on humeral head. Periarticular calcification above the AC joint. No obvious erosions or effusions seen. Impression Mild to moderate glenohumeral and AC joint osteoarthritis, much less advanced compared to right shoulder and without soft tissue calcifications  XR Shoulder Right  Result Date: 10/22/2021 Xray right shoulder 4 views Extensive degenerative appearing changes with glenohumeral joint space narrowing and subchondral sclerosis. Numerous cystic changes around lateral and superior surfaces. Shoulder is held in slightly high position. With external rotation view.  Suspect vascular calcifications seen laterally with interior rotation view. Impression Extensive osteoarthritis changes, no obvious effusions, erosions, demineralization or acute bony abnormality    Subjective: - no chest pain, shortness of breath, no abdominal pain, nausea or vomiting.   Discharge Exam: BP (!) 167/71 (BP Location: Right Arm)    Pulse (!) 108    Temp 98.5 F (36.9 C) (Oral)    Resp 20    Ht '5\' 7"'$  (1.702 m)    Wt 65 kg    SpO2 96%    BMI 22.46 kg/m   General: Pt is alert, awake, not in acute distress   The results of significant diagnostics from this hospitalization (including imaging, microbiology, ancillary and laboratory) are listed below for reference.     Microbiology: Recent Results (from the past 240 hour(s))  Resp Panel by RT-PCR (Flu A&B, Covid) Nasopharyngeal Swab     Status: None   Collection Time: 10/30/21  6:04 PM   Specimen: Nasopharyngeal Swab; Nasopharyngeal(NP) swabs in vial transport medium  Result Value Ref Range Status   SARS Coronavirus 2 by RT PCR NEGATIVE NEGATIVE Final    Comment: (NOTE) SARS-CoV-2 target nucleic acids are NOT DETECTED.  The SARS-CoV-2 RNA is generally detectable in upper respiratory specimens during the acute phase of infection. The lowest concentration of SARS-CoV-2 viral copies this assay can detect is 138 copies/mL. A negative result does not preclude SARS-Cov-2 infection and should not be used as the sole basis for treatment or other patient management decisions. A negative result may occur with  improper specimen collection/handling, submission of specimen other than nasopharyngeal swab, presence of viral mutation(s) within the areas targeted by this assay, and inadequate number of viral copies(<138 copies/mL). A negative result must be combined with clinical observations, patient history, and epidemiological information. The expected result is Negative.  Fact Sheet for Patients:   EntrepreneurPulse.com.au  Fact Sheet for Healthcare Providers:  IncredibleEmployment.be  This test is no t yet approved or cleared by the [place] FDA and  has been authorized for detection and/or diagnosis of SARS-CoV-2 by FDA under an Emergency Use Authorization (EUA). This EUA will remain  in effect (meaning this test can be used) for the duration of the COVID-19 declaration under Section 564(b)(1) of the Act, 21 U.S.C.section 360bbb-3(b)(1), unless the authorization is terminated  or revoked sooner.       Influenza A by PCR NEGATIVE NEGATIVE Final   Influenza B by PCR NEGATIVE NEGATIVE Final    Comment: (NOTE) The Xpert Xpress SARS-CoV-2/FLU/RSV plus assay is intended as an aid in the diagnosis of influenza from Nasopharyngeal swab specimens and should not be used as a sole basis for treatment. Nasal washings and aspirates are unacceptable for Xpert Xpress SARS-CoV-2/FLU/RSV testing.  Fact Sheet for Patients: EntrepreneurPulse.com.au  Fact Sheet for Healthcare Providers: IncredibleEmployment.be  This test is not yet approved or cleared by  the [employer] and has been authorized for detection and/or diagnosis of SARS-CoV-2 by FDA under an Emergency Use Authorization (EUA). This EUA will remain in effect (meaning this test can be used) for the duration of the COVID-19 declaration under Section 564(b)(1) of the Act, 21 U.S.C. section 360bbb-3(b)(1), unless the authorization is terminated or revoked.  Performed at [facility], [place] [address], [place], [place] [address]      Labs: Basic Metabolic Panel: Recent Labs  Lab 10/30/21 1642 10/30/21 1812 10/31/21 0259 10/31/21 1919 11/01/21 0343 11/02/21 0626 11/03/21 0448  NA 136 136 138  --  139 139 137  K 3.9 3.8 3.9  --  3.5 3.4* 3.3*  CL 105 106 107  --  107 107 105  CO2 22  --  23  --  '22 24 24  '$ GLUCOSE 144* 171* 88  --   91 108* 109*  BUN 50* 45* 35*  --  '15 10 8  '$ CREATININE 1.05 1.10 0.88  --  0.94 0.71 0.71  CALCIUM 9.0  --  7.9*  --  8.2* 8.3* 8.4*  MG  --   --   --  1.8  --   --   --    Liver Function Tests: Recent Labs  Lab 10/30/21 1642 11/01/21 0343  AST 19 23  ALT 15 16  ALKPHOS 33* 33*  BILITOT 0.6 1.2  PROT 5.4* 4.6*  ALBUMIN 2.6* 2.4*   CBC: Recent Labs  Lab 10/31/21 0259 10/31/21 1919 11/01/21 0343 11/01/21 1654 11/02/21 0626 11/02/21 1636 11/03/21 0448 11/03/21 1631 11/04/21 0708  WBC 10.0 7.9 7.2  --  6.6  --  6.2  --   --   HGB 7.8* 8.1* 8.6*   < > 9.0* 9.0* 9.2* 9.8* 8.6*  HCT 23.2* 24.1* 25.4*   < > 26.8* 27.5* 27.2* 30.4* 27.1*  MCV 96.3 96.0 94.1  --  95.4  --  94.8  --   --   PLT 211 239 216  --  249  --  263  --   --    < > = values in this interval not displayed.   CBG: No results for input(s): GLUCAP in the last 168 hours. Hgb A1c No results for input(s): HGBA1C in the last 72 hours. Lipid Profile No results for input(s): CHOL, HDL, LDLCALC, TRIG, CHOLHDL, LDLDIRECT in the last 72 hours. Thyroid function studies No results for input(s): TSH, T4TOTAL, T3FREE, THYROIDAB in the last 72 hours.  Invalid input(s): FREET3 Urinalysis    Component Value Date/Time   COLORURINE YELLOW 07/16/2020 [place] 07/16/2020 1136   LABSPEC 1.029 07/16/2020 1136   PHURINE 5.0 07/16/2020 1136   GLUCOSEU NEGATIVE 07/16/2020 1136   GLUCOSEU NEGATIVE 07/26/2015 1021   HGBUR NEGATIVE 07/16/2020 1136   BILIRUBINUR MODERATE (A) 07/16/2020 1136   KETONESUR NEGATIVE 07/16/2020 1136   PROTEINUR NEGATIVE 07/16/2020 1136   UROBILINOGEN 0.2 07/26/2015 1021   NITRITE NEGATIVE 07/16/2020 1136   LEUKOCYTESUR NEGATIVE 07/16/2020 1136    FURTHER DISCHARGE INSTRUCTIONS:   Get Medicines reviewed and adjusted: Please take all your medications with you for your next visit with your Primary MD   Laboratory/radiological data: Please request your Primary MD to go over  all hospital tests and procedure/radiological results at the follow up, please ask your Primary MD to get all Hospital records sent to his/her office.   In some cases, they will be blood work, cultures and biopsy results pending at  the time of your discharge. Please request that your primary care M.D. goes through all the records of your hospital data and follows up on these results.   Also Note the following: If you experience worsening of your admission symptoms, develop shortness of breath, life threatening emergency, suicidal or homicidal thoughts you must seek medical attention immediately by calling 911 or calling your MD immediately  if symptoms less severe.   You must read complete instructions/literature along with all the possible adverse reactions/side effects for all the Medicines you take and that have been prescribed to you. Take any new Medicines after you have completely understood and accpet all the possible adverse reactions/side effects.    Do not drive when taking Pain medications or sleeping medications (Benzodaizepines)   Do not take more than prescribed Pain, Sleep and Anxiety Medications. It is not advisable to combine anxiety,sleep and pain medications without talking with your primary care practitioner   Special Instructions: If you have smoked or chewed Tobacco  in the last 2 yrs please stop smoking, stop any regular Alcohol  and or any Recreational drug use.   Wear Seat belts while driving.   Please note: You were cared for by a hospitalist during your hospital stay. Once you are discharged, your primary care physician will handle any further medical issues. Please note that NO REFILLS for any discharge medications will be authorized once you are discharged, as it is imperative that you return to your primary care physician (or establish a relationship with a primary care physician if you do not have one) for your post hospital discharge needs so that they can reassess  your need for medications and monitor your lab values.  Time coordinating discharge: 40 minutes  SIGNED:  Marzetta Board, MD, PhD 11/04/2021, 9:21 AM

## 2021-11-04 NOTE — Progress Notes (Signed)
Mobility Specialist Progress Note: ? ? 11/04/21 1000  ?Mobility  ?Activity Ambulated independently in hallway  ?Level of Assistance Independent  ?Assistive Device None  ?Distance Ambulated (ft) 450 ft  ?Activity Response Tolerated well  ?$Mobility charge 1 Mobility  ? ?Pt asx during ambulation. Back sitting in chair with all needs met.  ? ?[name] ?Acute Rehab ?Phone: [phone] ?Office Phone: [phone] ? ?

## 2021-11-05 ENCOUNTER — Telehealth: Payer: Self-pay

## 2021-11-05 ENCOUNTER — Other Ambulatory Visit: Payer: Self-pay

## 2021-11-05 DIAGNOSIS — K922 Gastrointestinal hemorrhage, unspecified: Secondary | ICD-10-CM

## 2021-11-05 NOTE — Telephone Encounter (Signed)
-----   Message from Gatha Mayer, MD sent at 11/02/2021  3:30 PM EST ----- ?Regarding: appt Dr. Fuller Plan vs APP 1 month ?Please make a hosp f/u - duodenal ulcer w/ hemorrhage for Dr. Fuller Plan in about 1 month ? ?If he is not available can do an APP - Nevin Bloodgood would be first choice because she has seen him  ? ?

## 2021-11-05 NOTE — Addendum Note (Signed)
Addended by: Gillermina Hu on: 11/05/2021 04:36 PM ? ? Modules accepted: Orders ? ?

## 2021-11-05 NOTE — Telephone Encounter (Signed)
-----   Message from Gatha Mayer, MD sent at 11/04/2021 10:31 AM EST ----- ?Regarding: needs CBC 3/8 ?Please have him get a CBC on 3/8 ?He is leaving hospital  ? ?Also needs f/u Fuller Plan or an APP in 1 month about ? ?

## 2021-11-05 NOTE — Telephone Encounter (Signed)
Orders for CBC placed in Epic; Pt made aware; Orders placed where pt can have them drawn in [place] per pt request:  ?Pt made aware of office Visit with Dr. Fuller Plan 12/03/2021 at 9:10: ?Pt verbalized understanding with all questions answered.  ? ?

## 2021-11-05 NOTE — Telephone Encounter (Signed)
Pt scheduled for 12/03/2021 at 9:10 with Dr. Fuller Plan: Pt made aware; Address provided ?Pt verbalized understanding with all questions answered.  ? ?

## 2021-11-06 ENCOUNTER — Ambulatory Visit (INDEPENDENT_AMBULATORY_CARE_PROVIDER_SITE_OTHER): Payer: Medicare HMO | Admitting: Family

## 2021-11-06 ENCOUNTER — Other Ambulatory Visit (HOSPITAL_BASED_OUTPATIENT_CLINIC_OR_DEPARTMENT_OTHER): Payer: Self-pay

## 2021-11-06 VITALS — BP 161/80 | HR 96 | Temp 98.4°F | Resp 16 | Wt 142.0 lb

## 2021-11-06 DIAGNOSIS — K922 Gastrointestinal hemorrhage, unspecified: Secondary | ICD-10-CM

## 2021-11-06 DIAGNOSIS — K260 Acute duodenal ulcer with hemorrhage: Secondary | ICD-10-CM | POA: Diagnosis not present

## 2021-11-06 DIAGNOSIS — I10 Essential (primary) hypertension: Secondary | ICD-10-CM

## 2021-11-06 DIAGNOSIS — G894 Chronic pain syndrome: Secondary | ICD-10-CM | POA: Diagnosis not present

## 2021-11-06 LAB — CBC WITH DIFFERENTIAL/PLATELET
Basophils Absolute: 0.1 10*3/uL (ref 0.0–0.1)
Basophils Relative: 1.1 % (ref 0.0–3.0)
Eosinophils Absolute: 0.2 10*3/uL (ref 0.0–0.7)
Eosinophils Relative: 3.2 % (ref 0.0–5.0)
HCT: 25.3 % — ABNORMAL LOW (ref 39.0–52.0)
Hemoglobin: 8.5 g/dL — ABNORMAL LOW (ref 13.0–17.0)
Lymphocytes Relative: 13.3 % (ref 12.0–46.0)
Lymphs Abs: 0.8 10*3/uL (ref 0.7–4.0)
MCHC: 33.7 g/dL (ref 30.0–36.0)
MCV: 93.9 fl (ref 78.0–100.0)
Monocytes Absolute: 0.5 10*3/uL (ref 0.1–1.0)
Monocytes Relative: 7.9 % (ref 3.0–12.0)
Neutro Abs: 4.6 10*3/uL (ref 1.4–7.7)
Neutrophils Relative %: 74.5 % (ref 43.0–77.0)
Platelets: 363 10*3/uL (ref 150.0–400.0)
RBC: 2.7 Mil/uL — ABNORMAL LOW (ref 4.22–5.81)
RDW: 14.7 % (ref 11.5–15.5)
WBC: 6.2 10*3/uL (ref 4.0–10.5)

## 2021-11-06 LAB — COMPREHENSIVE METABOLIC PANEL
ALT: 17 U/L (ref 0–53)
AST: 18 U/L (ref 0–37)
Albumin: 3.2 g/dL — ABNORMAL LOW (ref 3.5–5.2)
Alkaline Phosphatase: 44 U/L (ref 39–117)
BUN: 24 mg/dL — ABNORMAL HIGH (ref 6–23)
CO2: 25 mEq/L (ref 19–32)
Calcium: 8.2 mg/dL — ABNORMAL LOW (ref 8.4–10.5)
Chloride: 105 mEq/L (ref 96–112)
Creatinine, Ser: 0.63 mg/dL (ref 0.40–1.50)
GFR: 93.38 mL/min (ref >60.00)
Glucose, Bld: 92 mg/dL (ref 70–99)
Potassium: 4.1 mEq/L (ref 3.5–5.1)
Sodium: 137 mEq/L (ref 135–145)
Total Bilirubin: 0.5 mg/dL (ref 0.2–1.2)
Total Protein: 5.4 g/dL — ABNORMAL LOW (ref 6.0–8.3)

## 2021-11-06 MED ORDER — AMLODIPINE BESYLATE 5 MG PO TABS
7.5000 mg | ORAL_TABLET | Freq: Every day | ORAL | 1 refills | Status: DC
  Filled 2021-11-06: qty 135, 90d supply, fill #0

## 2021-11-06 MED ORDER — TRAMADOL HCL 50 MG PO TABS
50.0000 mg | ORAL_TABLET | Freq: Three times a day (TID) | ORAL | 0 refills | Status: AC | PRN
Start: 2021-11-06 — End: 2021-11-16
  Filled 2021-11-06: qty 30, 10d supply, fill #0

## 2021-11-06 NOTE — Assessment & Plan Note (Addendum)
Clinically resolved. Continue PPI, NSAID avoidance. Obtain follow up CBC. Follow up with GI as scheduled.  ?

## 2021-11-06 NOTE — Assessment & Plan Note (Addendum)
BP Readings from Last 3 Encounters:  ?11/06/21 (!) 161/80  ?11/04/21 (!) 161/52  ?10/30/21 128/76  ? ?Uncontrolled on amlodipine '5mg'$ . Will increase to 7.'5mg'$  once daily.  ?

## 2021-11-06 NOTE — Progress Notes (Signed)
Subjective:   By signing my name below, I, Cassell Clement, attest that this documentation has been prepared under the direction and in the presence of Sandford Craze NP, 11/06/2021     Patient ID: [name], male    DOB: 1946-06-29, 76 y.o.   MRN: [id number]  Chief Complaint  Patient presents with   Follow-up    Here for follow up after hospitalization     HPI Patient is in today for a hospital follow - up.   Upper GI Endoscopy (EGD) - Patient went to the pulmonary office on 10/30/2021. He reported that he had black stools, SOB, and was vomiting blood on the same day. He was then immediately admitted to the hospital on that day. An EGD was performed and was found that he had an ulcer. He received several blood transfusions. He was later discharged on 11/04/2021.  Patient has been weak but feeling better. As of 11/06/2021 his stool frequency and color have been improving. Patient denies nausea and vomiting. Patient reports Shortness of Breath.  Pain - He states that he has been struggling to go up the stairs because of pain. (Especially in his knees and hips). Prior to his hospital admission he was relying on meloxicam for his pain which was very helpful. He has been using Tylenol for pain relief which is not as helpful. He was given a 3 day supply of dilaudid but has not taken any. He is requesting something in addition to tylenol in the event of severe pain.   Blood Pressure - His blood pressure has risen. He continues to take 5 MG of amlodipine. Blood pressure as of 11/06/2021 reports 160/75. BP Readings from Last 3 Encounters:  11/06/21 (!) 161/80  11/04/21 (!) 161/52  10/30/21 128/76   Exercise - Patient inquires about boat activities. He is advised that it is okay to continue motorized boat activities as long as he has someone with him.   Health Maintenance Due  Topic Date Due   URINE MICROALBUMIN  Never done    Past Medical History:  Diagnosis Date   Allergic  rhinitis    Atypical chest pain 12/01/2014   Back pain 01/12/2013   COPD (chronic obstructive pulmonary disease) (HCC)    FeV1 64%-2007   Dyspnea    Emphysema    GERD (gastroesophageal reflux disease)    History of lung abscess    bronchiectasis with RMLandRLL ersection -1996- Dr Edwyna Shell   Hordeolum externum (stye) 04/23/2015   Right eye   Hypertension    Impaired vision    glasses   Osteoarthritis    hands and knees   Sinusitis, acute 04/23/2015    Past Surgical History:  Procedure Laterality Date   COLONOSCOPY  11/20/2010   Dr.Stark   ESOPHAGOGASTRODUODENOSCOPY (EGD) WITH PROPOFOL N/A 10/31/2021   Procedure: ESOPHAGOGASTRODUODENOSCOPY (EGD) WITH PROPOFOL;  Surgeon: Iva Boop, MD;  Location[facility] ENDOSCOPY;  Service: Gastroenterology;  Laterality: N/A;   HEMOSTASIS CONTROL  10/31/2021   Procedure: HEMOSTASIS CONTROL;  Surgeon: Iva Boop, MD;  Location: [facility] ENDOSCOPY;  Service: Gastroenterology;;   HERNIA REPAIR  10/03/2009   HOT HEMOSTASIS N/A 10/31/2021   Procedure: HOT HEMOSTASIS (ARGON PLASMA COAGULATION/BICAP);  Surgeon: Iva Boop, MD;  Location: [facility] ENDOSCOPY;  Service: Gastroenterology;  Laterality: N/A;   left knee     lower back surgery  09/02/2008   06/2009   LUNG SURGERY     RML andRUL removed due to bleeding and bronchiectasis and lung abcess  NECK SURGERY  05/03/2008   right foot surgery     SCLEROTHERAPY  10/31/2021   Procedure: SCLEROTHERAPY;  Surgeon: Gatha Mayer, MD;  Location: [facility] ENDOSCOPY;  Service: Gastroenterology;;   VIDEO BRONCHOSCOPY N/A 07/12/2020   Procedure: VIDEO BRONCHOSCOPY;  Surgeon: [name], MD;  Location: [facility] OR;  Service: Thoracic;  Laterality: N/A;   VIDEO BRONCHOSCOPY WITH ENDOBRONCHIAL NAVIGATION N/A 07/12/2020   Procedure: VIDEO BRONCHOSCOPY WITH ENDOBRONCHIAL NAVIGATION;  Surgeon: [name], MD;  Location: [facility] OR;  Service: Thoracic;  Laterality: N/A;    Family History  Problem Relation Age of Onset    Heart failure Mother        age 38   Prostate cancer Father        father died prostate ca   Obstructive Sleep Apnea Brother    Obesity Brother    Colon cancer Paternal Grandmother    Healthy Son    Coronary artery disease Other        1st degree relative<60   Stroke Other        1st degree relative<50   Esophageal cancer Neg Hx    Stomach cancer Neg Hx     Social History   Socioeconomic History   Marital status: Married    Spouse name: Not on file   Number of children: Not on file   Years of education: Not on file   Highest education level: Not on file  Occupational History   Occupation: retired Music therapist  Tobacco Use   Smoking status: Former    Packs/day: 1.50    Years: 38.00    Pack years: 57.00    Types: Cigarettes    Quit date: 09/03/2000    Years since quitting: 21.1    Passive exposure: Past   Smokeless tobacco: Never  Vaping Use   Vaping Use: Never used  Substance and Sexual Activity   Alcohol use: Yes    Alcohol/week: 1.0 standard drink    Types: 1 Cans of beer per week    Comment: 3 or 4 nights weekly   Drug use: No   Sexual activity: Never  Other Topics Concern   Not on file  Social History Narrative   Retired Music therapist   Patient states former smoker. 1 1/2 ppd x 38 yrs  Quit in Jan 2002   Married - 2 weeks (4th marriage)   divorced,  remarried 84-2004 (lost wife to lung ca),  remarried (divorced),    1 son  - 75 Ruben File)   Alcohol use-yes (2-3 beers per week)           Social Determinants of Health   Financial Resource Strain: Low Risk    Difficulty of Paying Living Expenses: Not very hard  Food Insecurity: No Food Insecurity   Worried About Charity fundraiser in the Last Year: Never true   Ran Out of Food in the Last Year: Never true  Transportation Needs: No Transportation Needs   Lack of Transportation (Medical): No   Lack of Transportation (Non-Medical): No  Physical Activity: Sufficiently Active   Days of  Exercise per Week: 6 days   Minutes of Exercise per Session: 30 min  Stress: No Stress Concern Present   Feeling of Stress : Not at all  Social Connections: Moderately Integrated   Frequency of Communication with Friends and Family: More than three times a week   Frequency of Social Gatherings with Friends and Family: More than three times a  week   Attends Religious Services: More than 4 times per year   Active Member of Clubs or Organizations: No   Attends Archivist Meetings: Never   Marital Status: Married  Human resources officer Violence: Not At Risk   Fear of Current or Ex-Partner: No   Emotionally Abused: No   Physically Abused: No   Sexually Abused: No    Outpatient Medications Prior to Visit  Medication Sig Dispense Refill   acetaminophen (TYLENOL) 650 MG CR tablet Take 1,300 mg by mouth every 8 (eight) hours as needed for pain.     albuterol (VENTOLIN HFA) 108 (90 Base) MCG/ACT inhaler INHALE 2 PUFFS INTO THE LUNGS EVERY 6 HOURS AS NEEDED FOR WHEEZING OR SHORTNESS OF BREATH. (Patient taking differently: Inhale 2 puffs into the lungs every 6 (six) hours as needed for wheezing or shortness of breath.) 2 each 0   benzonatate (TESSALON) 100 MG capsule Take 1 capsule (100 mg total) by mouth 3 (three) times daily as needed for cough. 40 capsule 2   calcium elemental as carbonate (BARIATRIC TUMS ULTRA) 400 MG chewable tablet Chew 1,000 mg by mouth 2 (two) times daily.      Capsaicin-Menthol (SALONPAS GEL EX) Apply 1 application topically daily as needed (arthritis pain).     cetirizine (ZYRTEC) 10 MG tablet Take 10 mg by mouth daily.     diclofenac Sodium (VOLTAREN) 1 % GEL Apply 2 g topically 3 (three) times daily as needed (pain).     ferrous sulfate 325 (65 FE) MG tablet Take 1 tablet (325 mg total) by mouth daily. 30 tablet 0   fluticasone (FLONASE) 50 MCG/ACT nasal spray Place 2 sprays into both nostrils daily. 16 g 5   Glucosamine-Chondroitin 750-600 MG TABS Take 1 tablet by  mouth 2 (two) times daily.     Guaifenesin 1200 MG TB12 Take 600 mg by mouth 2 (two) times daily.     imiquimod (ALDARA) 5 % cream Apply topically 3 (three) times a week. 12 each 1   loratadine (CLARITIN) 10 MG tablet Take 10 mg by mouth daily.     Menthol (ROBITUSSIN COUGH DROPS MT) Use as directed 1 lozenge in the mouth or throat 4 (four) times daily as needed (cough).      Menthol, Topical Analgesic, (BLUE-EMU MAXIMUM STRENGTH EX) Apply 1 application topically daily as needed (arthritis pain).      montelukast (SINGULAIR) 10 MG tablet TAKE 1 TABLET EVERY DAY (Patient taking differently: Take 10 mg by mouth at bedtime.) 90 tablet 3   Multiple Vitamin (MULTIVITAMIN WITH MINERALS) TABS tablet Take 1 tablet by mouth daily.      pantoprazole (PROTONIX) 40 MG tablet Take 1 tablet (40 mg total) by mouth 2 (two) times daily before a meal. 60 tablet 1   TRELEGY ELLIPTA 100-62.5-25 MCG/ACT AEPB INHALE 1 PUFF INTO THE LUNGS DAILY (Patient taking differently: Inhale 1 puff into the lungs daily.) 180 each 1   vitamin B-12 (CYANOCOBALAMIN) 1000 MCG tablet Take 1 tablet (1,000 mcg total) by mouth daily. 30 tablet 0   amLODipine (NORVASC) 5 MG tablet TAKE 1 TABLET EVERY DAY (Patient taking differently: Take 5 mg by mouth daily. TAKE 1 TABLET EVERY DAY) 90 tablet 1   HYDROmorphone (DILAUDID) 2 MG tablet Take 1 tablet (2 mg total) by mouth every 6 (six) hours as needed for up to 3 days for severe pain. 12 tablet 0   No facility-administered medications prior to visit.    Allergies  Allergen Reactions  Losartan Other (See Comments)    weakness    Review of Systems  Constitutional:        (+) Weakness  Respiratory:  Positive for shortness of breath.   Gastrointestinal:  Negative for nausea and vomiting.      Objective:    Physical Exam Constitutional:      General: He is not in acute distress.    Appearance: Normal appearance. He is not ill-appearing.  HENT:     Head: Normocephalic and  atraumatic.     Right Ear: External ear normal.     Left Ear: External ear normal.  Eyes:     Extraocular Movements: Extraocular movements intact.     Pupils: Pupils are equal, round, and reactive to light.  Cardiovascular:     Rate and Rhythm: Normal rate and regular rhythm.     Heart sounds: Normal heart sounds. No murmur heard.   No gallop.  Pulmonary:     Effort: Pulmonary effort is normal. No respiratory distress.     Breath sounds: Normal air entry. Examination of the right-lower field reveals rales. Rales present. No wheezing.     Comments: (+) Crackles on RLL in lungs Abdominal:     General: Bowel sounds are normal. There is no distension.     Palpations: Abdomen is soft.     Tenderness: There is no abdominal tenderness. There is no guarding.  Skin:    General: Skin is warm and dry.  Neurological:     Mental Status: He is alert and oriented to person, place, and time.  Psychiatric:        Judgment: Judgment normal.    BP (!) 161/80 (BP Location: Right Arm, Patient Position: Sitting, Cuff Size: Small)    Pulse 96    Temp 98.4 F (36.9 C) (Oral)    Resp 16    Wt 142 lb (64.4 kg)    SpO2 99%    BMI 22.24 kg/m  Wt Readings from Last 3 Encounters:  11/06/21 142 lb (64.4 kg)  11/04/21 143 lb 6.4 oz (65 kg)  10/30/21 143 lb (64.9 kg)       Assessment & Plan:   Problem List Items Addressed This Visit       Unprioritized   Chronic pain syndrome    Pt is advised to take scheduled tylenol $RemoveBeforeDEI'1000mg'fYqttCFPiiucjKoP$  TID. For breakthrough/severe pain, he can use tramadol $RemoveBefo'50mg'kBRrEYydhrA$  Q8 hrs prn. Reviewed [place] controlled substance registry. D/C dilaudid. Controlled substance contract is signed and UDS to be obtained today.       Relevant Medications   traMADol (ULTRAM) 50 MG tablet   Other Relevant Orders   DRUG MONITORING, PANEL 8 WITH CONFIRMATION, URINE   Benign essential HTN    BP Readings from Last 3 Encounters:  11/06/21 (!) 161/80  11/04/21 (!) 161/52  10/30/21 128/76  Uncontrolled on  amlodipine $RemoveBef'5mg'kFztAmPTXJ$ . Will increase to 7.$RemoveBefor'5mg'lsGXTzbUTVmb$  once daily.       Relevant Medications   amLODipine (NORVASC) 5 MG tablet   Other Relevant Orders   Comp Met (CMET)   Acute duodenal ulcer with hemorrhage    Clinically resolved. Continue PPI, NSAID avoidance. Obtain follow up CBC. Follow up with GI as scheduled.       Other Visit Diagnoses     Gastrointestinal hemorrhage, unspecified gastrointestinal hemorrhage type    -  Primary   Relevant Orders   CBC with Differential/Platelet       47 minutes spent on today's visit. Time was spent counseling patient,  reviewing hospital records and formulating medical plan.   Meds ordered this encounter  Medications   amLODipine (NORVASC) 5 MG tablet    Sig: Take 1 & 1/2 tablet (7.5 mg total) by mouth daily.    Dispense:  135 tablet    Refill:  1    Order Specific Question:   Supervising Provider    Answer:   Penni Homans A [[id number]]   traMADol (ULTRAM) 50 MG tablet    Sig: Take 1 tablet (50 mg total) by mouth every 8 (eight) hours as needed for up to 5 days.    Dispense:  30 tablet    Refill:  0    Order Specific Question:   Supervising Provider    Answer:   Penni Homans A [[id number]]    I, Nance Pear, NP, personally preformed the services described in this documentation.  All medical record entries made by the scribe were at my direction and in my presence.  I have reviewed the chart and discharge instructions (if applicable) and agree that the record reflects my personal performance and is accurate and complete. 11/06/2021   I,Amber Collins,acting as a scribe for Nance Pear, NP.,have documented all relevant documentation on the behalf of Nance Pear, NP,as directed by  Nance Pear, NP while in the presence of Nance Pear, NP.    Nance Pear, NP

## 2021-11-06 NOTE — Assessment & Plan Note (Signed)
Pt is advised to take scheduled tylenol '1000mg'$  TID. For breakthrough/severe pain, he can use tramadol '50mg'$  Q8 hrs prn. Reviewed [place] controlled substance registry. D/C dilaudid. Controlled substance contract is signed and UDS to be obtained today.  ?

## 2021-11-06 NOTE — Patient Instructions (Addendum)
Please increase your amlodipine from '5mg'$  to 7.'5mg'$  (1.5 tabs) once daily. ?Stop Dilaudid.  ?Change your tylenol dosing to '1000mg'$  3x daily as needed for pain. ?For more severe pain, you may use tramadol '50mg'$  every 8 hrs as needed.  ?Please complete lab work prior to leaving.  ?

## 2021-11-07 ENCOUNTER — Ambulatory Visit: Payer: Medicare HMO | Admitting: Internal Medicine

## 2021-11-07 LAB — DRUG MONITORING, PANEL 8 WITH CONFIRMATION, URINE
6 Acetylmorphine: NEGATIVE ng/mL (ref <10)
Alcohol Metabolites: NEGATIVE ng/mL (ref <500)
Amphetamines: NEGATIVE ng/mL (ref <500)
Benzodiazepines: NEGATIVE ng/mL (ref <100)
Buprenorphine, Urine: NEGATIVE ng/mL (ref <5)
Cocaine Metabolite: NEGATIVE ng/mL (ref <150)
Creatinine: 65.8 mg/dL (ref >=20.0)
MDMA: NEGATIVE ng/mL (ref <500)
Marijuana Metabolite: NEGATIVE ng/mL (ref <20)
Opiates: NEGATIVE ng/mL (ref <100)
Oxidant: NEGATIVE ug/mL (ref <200)
Oxycodone: NEGATIVE ng/mL (ref <100)
pH: 6.1 (ref 4.5–9.0)

## 2021-11-07 LAB — DM TEMPLATE

## 2021-11-13 ENCOUNTER — Ambulatory Visit (INDEPENDENT_AMBULATORY_CARE_PROVIDER_SITE_OTHER): Payer: Medicare HMO | Admitting: Family Medicine

## 2021-11-13 ENCOUNTER — Ambulatory Visit: Payer: Medicare HMO | Admitting: Family Medicine

## 2021-11-13 ENCOUNTER — Encounter: Payer: Self-pay | Admitting: Family Medicine

## 2021-11-13 VITALS — BP 128/56 | HR 63 | Temp 97.5°F | Ht 67.0 in | Wt 139.6 lb

## 2021-11-13 DIAGNOSIS — D649 Anemia, unspecified: Secondary | ICD-10-CM

## 2021-11-13 LAB — CBC
HCT: 31.7 % — ABNORMAL LOW (ref 39.0–52.0)
Hemoglobin: 10.2 g/dL — ABNORMAL LOW (ref 13.0–17.0)
MCHC: 32.2 g/dL (ref 30.0–36.0)
MCV: 93.2 fl (ref 78.0–100.0)
Platelets: 518 10*3/uL — ABNORMAL HIGH (ref 150.0–400.0)
RBC: 3.4 Mil/uL — ABNORMAL LOW (ref 4.22–5.81)
RDW: 15.1 % (ref 11.5–15.5)
WBC: 5.9 10*3/uL (ref 4.0–10.5)

## 2021-11-13 NOTE — Progress Notes (Signed)
Hemoglobin is coming up nicely! Platelets did jump up some, but this is likely reactive. Let's recheck in about 2 weeks to make sure we are still heading in the right direction. I will place the order. Please schedule a lab appointment.

## 2021-11-13 NOTE — Progress Notes (Signed)
? ?Established Patient Office Visit ? ?Subjective:  ?Patient ID: [name], male    DOB: 28-Dec-1945  Age: 76 y.o. MRN: [id number] ? ?CC:  ?Chief Complaint  ?Patient presents with  ? Fatigue  ?  And arthritis pain  ? blood work   ?  Wants to go over them and check new blood work   ? ? ?HPI ?[name] presents for follow-up.  ? ?Patient was hospitalized from February 28th to March 5th for bleeding ulcer, blood transfusions. He followed-up in PCP office on 11/06/21 with labs improving. BP was elevated at that visit and amlodipine was increased to 7.5 mg daily. Because of the ulcer he had to stop meloxicam for chronic hip/knee/shoulder pain and was not getting adequate relief with Tylenol. He did not want the dilaudid the hospital gave him, so he was changed to tramadol.  ? ?Today he reports bowel movements are normalizing, no longer black, but sometimes have a gray tint. No more vomiting blood. Energy level is still low - Hgb at follow-up was 8.5 he would like to repeat today. He is still having a lot of discomfort since being off the meloxicam - he really does not like taking anything stronger than APAP unless miserable. He has been getting BP readings 130s/70s at home since adjusting amlodipine. Denies chest pain, dyspnea, headaches, dizziness, edema. States just weak and feeling like he's aged 20-years since stopping meloxicam. He is working to reschedule an appointment with his ortho provider to discuss.  ? ? ? ? ? ? ?Past Medical History:  ?Diagnosis Date  ? Allergic rhinitis   ? Atypical chest pain 12/01/2014  ? Back pain 01/12/2013  ? COPD (chronic obstructive pulmonary disease) ([place])   ? FeV1 64%-2007  ? Dyspnea   ? Emphysema   ? GERD (gastroesophageal reflux disease)   ? History of lung abscess   ? bronchiectasis with RMLandRLL ersection -1996- Dr Arlyce Dice  ? Hordeolum externum (stye) 04/23/2015  ? Right eye  ? Hypertension   ? Impaired vision   ? glasses  ? Osteoarthritis   ? hands and knees  ? Sinusitis,  acute 04/23/2015  ? ? ?Past Surgical History:  ?Procedure Laterality Date  ? COLONOSCOPY  11/20/2010  ? Dr.Stark  ? ESOPHAGOGASTRODUODENOSCOPY (EGD) WITH PROPOFOL N/A 10/31/2021  ? Procedure: ESOPHAGOGASTRODUODENOSCOPY (EGD) WITH PROPOFOL;  Surgeon: Gatha Mayer, MD;  Location: [place];  Service: Gastroenterology;  Laterality: N/A;  ? HEMOSTASIS CONTROL  10/31/2021  ? Procedure: HEMOSTASIS CONTROL;  Surgeon: Gatha Mayer, MD;  Location: [facility] ENDOSCOPY;  Service: Gastroenterology;;  ? HERNIA REPAIR  10/03/2009  ? HOT HEMOSTASIS N/A 10/31/2021  ? Procedure: HOT HEMOSTASIS (ARGON PLASMA COAGULATION/BICAP);  Surgeon: Gatha Mayer, MD;  Location: [place];  Service: Gastroenterology;  Laterality: N/A;  ? left knee    ? lower back surgery  09/02/2008  ? 06/2009  ? LUNG SURGERY    ? RML andRUL removed due to bleeding and bronchiectasis and lung abcess  ? NECK SURGERY  05/03/2008  ? right foot surgery    ? SCLEROTHERAPY  10/31/2021  ? Procedure: SCLEROTHERAPY;  Surgeon: Gatha Mayer, MD;  Location: [facility] ENDOSCOPY;  Service: Gastroenterology;;  ? VIDEO BRONCHOSCOPY N/A 07/12/2020  ? Procedure: VIDEO BRONCHOSCOPY;  Surgeon: [name], MD;  Location: [place];  Service: Thoracic;  Laterality: N/A;  ? VIDEO BRONCHOSCOPY WITH ENDOBRONCHIAL NAVIGATION N/A 07/12/2020  ? Procedure: VIDEO BRONCHOSCOPY WITH ENDOBRONCHIAL NAVIGATION;  Surgeon: [name], MD;  Location[facility]  OR;  Service: Thoracic;  Laterality: N/A;  ? ? ?Family History  ?Problem Relation Age of Onset  ? Heart failure Mother   ?     age 64  ? Prostate cancer Father   ?     father died prostate ca  ? Obstructive Sleep Apnea Brother   ? Obesity Brother   ? Colon cancer Paternal Grandmother   ? Healthy Son   ? Coronary artery disease Other   ?     1st degree relative<60  ? Stroke Other   ?     1st degree relative<50  ? Esophageal cancer Neg Hx   ? Stomach cancer Neg Hx   ? ? ?Social History  ? ?Socioeconomic History  ? Marital status: Married  ?   Spouse name: Not on file  ? Number of children: Not on file  ? Years of education: Not on file  ? Highest education level: Not on file  ?Occupational History  ? Occupation: retired Music therapist  ?Tobacco Use  ? Smoking status: Former  ?  Packs/day: 1.50  ?  Years: 38.00  ?  Pack years: 57.00  ?  Types: Cigarettes  ?  Quit date: 09/03/2000  ?  Years since quitting: 21.2  ?  Passive exposure: Past  ? Smokeless tobacco: Never  ?Vaping Use  ? Vaping Use: Never used  ?Substance and Sexual Activity  ? Alcohol use: Yes  ?  Alcohol/week: 1.0 standard drink  ?  Types: 1 Cans of beer per week  ?  Comment: 3 or 4 nights weekly  ? Drug use: No  ? Sexual activity: Never  ?Other Topics Concern  ? Not on file  ?Social History Narrative  ? Retired Music therapist  ? Patient states former smoker. 1 1/2 ppd x 38 yrs  Quit in Jan 2002  ? Married - 2 weeks (4th marriage)  ? divorced,  remarried 06-2003 (lost wife to lung ca),  remarried (divorced),   ? 1 son  - 50 [name])  ? Alcohol use-yes (2-3 beers per week)    ?   ?   ? ?Social Determinants of Health  ? ?Financial Resource Strain: Low Risk   ? Difficulty of Paying Living Expenses: Not very hard  ?Food Insecurity: No Food Insecurity  ? Worried About Charity fundraiser in the Last Year: Never true  ? Ran Out of Food in the Last Year: Never true  ?Transportation Needs: No Transportation Needs  ? Lack of Transportation (Medical): No  ? Lack of Transportation (Non-Medical): No  ?Physical Activity: Sufficiently Active  ? Days of Exercise per Week: 6 days  ? Minutes of Exercise per Session: 30 min  ?Stress: No Stress Concern Present  ? Feeling of Stress : Not at all  ?Social Connections: Moderately Integrated  ? Frequency of Communication with Friends and Family: More than three times a week  ? Frequency of Social Gatherings with Friends and Family: More than three times a week  ? Attends Religious Services: More than 4 times per year  ? Active Member of Clubs or  Organizations: No  ? Attends Archivist Meetings: Never  ? Marital Status: Married  ?Intimate Partner Violence: Not At Risk  ? Fear of Current or Ex-Partner: No  ? Emotionally Abused: No  ? Physically Abused: No  ? Sexually Abused: No  ? ? ?Outpatient Medications Prior to Visit  ?Medication Sig Dispense Refill  ? acetaminophen (TYLENOL) 650 MG CR tablet Take  1,300 mg by mouth every 8 (eight) hours as needed for pain.    ? albuterol (VENTOLIN HFA) 108 (90 Base) MCG/ACT inhaler INHALE 2 PUFFS INTO THE LUNGS EVERY 6 HOURS AS NEEDED FOR WHEEZING OR SHORTNESS OF BREATH. (Patient taking differently: Inhale 2 puffs into the lungs every 6 (six) hours as needed for wheezing or shortness of breath.) 2 each 0  ? amLODipine (NORVASC) 5 MG tablet Take 1 & 1/2 tablet (7.5 mg total) by mouth daily. 135 tablet 1  ? benzonatate (TESSALON) 100 MG capsule Take 1 capsule (100 mg total) by mouth 3 (three) times daily as needed for cough. 40 capsule 2  ? calcium elemental as carbonate (BARIATRIC TUMS ULTRA) 400 MG chewable tablet Chew 1,000 mg by mouth 2 (two) times daily.     ? Capsaicin-Menthol (SALONPAS GEL EX) Apply 1 application topically daily as needed (arthritis pain).    ? cetirizine (ZYRTEC) 10 MG tablet Take 10 mg by mouth daily.    ? diclofenac Sodium (VOLTAREN) 1 % GEL Apply 2 g topically 3 (three) times daily as needed (pain).    ? ferrous sulfate 325 (65 FE) MG tablet Take 1 tablet (325 mg total) by mouth daily. 30 tablet 0  ? fluticasone (FLONASE) 50 MCG/ACT nasal spray Place 2 sprays into both nostrils daily. 16 g 5  ? Glucosamine-Chondroitin 750-600 MG TABS Take 1 tablet by mouth 2 (two) times daily.    ? Guaifenesin 1200 MG TB12 Take 600 mg by mouth 2 (two) times daily.    ? imiquimod (ALDARA) 5 % cream Apply topically 3 (three) times a week. 12 each 1  ? loratadine (CLARITIN) 10 MG tablet Take 10 mg by mouth daily.    ? Menthol (ROBITUSSIN COUGH DROPS MT) Use as directed 1 lozenge in the mouth or throat 4  (four) times daily as needed (cough).     ? Menthol, Topical Analgesic, (BLUE-EMU MAXIMUM STRENGTH EX) Apply 1 application topically daily as needed (arthritis pain).     ? montelukast (SINGULAIR) 10 MG tablet T

## 2021-11-13 NOTE — Patient Instructions (Signed)
Rechecking Hgb today to ensure stable. Continue all medications as prescribed.  ?

## 2021-11-13 NOTE — Addendum Note (Signed)
Addended by: Caleen Jobs B on: 11/13/2021 04:39 PM ? ? Modules accepted: Orders ? ?

## 2021-11-14 ENCOUNTER — Other Ambulatory Visit (HOSPITAL_BASED_OUTPATIENT_CLINIC_OR_DEPARTMENT_OTHER): Payer: Self-pay

## 2021-11-15 ENCOUNTER — Other Ambulatory Visit (HOSPITAL_BASED_OUTPATIENT_CLINIC_OR_DEPARTMENT_OTHER): Payer: Self-pay

## 2021-11-19 ENCOUNTER — Other Ambulatory Visit: Payer: Self-pay | Admitting: Family Medicine

## 2021-11-27 ENCOUNTER — Other Ambulatory Visit (INDEPENDENT_AMBULATORY_CARE_PROVIDER_SITE_OTHER): Payer: Medicare HMO

## 2021-11-27 DIAGNOSIS — D649 Anemia, unspecified: Secondary | ICD-10-CM | POA: Diagnosis not present

## 2021-11-27 LAB — CBC WITH DIFFERENTIAL/PLATELET
Basophils Absolute: 0 10*3/uL (ref 0.0–0.1)
Basophils Relative: 0.8 % (ref 0.0–3.0)
Eosinophils Absolute: 0.1 10*3/uL (ref 0.0–0.7)
Eosinophils Relative: 1.3 % (ref 0.0–5.0)
HCT: 34.9 % — ABNORMAL LOW (ref 39.0–52.0)
Hemoglobin: 11.2 g/dL — ABNORMAL LOW (ref 13.0–17.0)
Lymphocytes Relative: 18.2 % (ref 12.0–46.0)
Lymphs Abs: 1.1 10*3/uL (ref 0.7–4.0)
MCHC: 32 g/dL (ref 30.0–36.0)
MCV: 93.4 fl (ref 78.0–100.0)
Monocytes Absolute: 0.4 10*3/uL (ref 0.1–1.0)
Monocytes Relative: 7.5 % (ref 3.0–12.0)
Neutro Abs: 4.2 10*3/uL (ref 1.4–7.7)
Neutrophils Relative %: 72.2 % (ref 43.0–77.0)
Platelets: 355 10*3/uL (ref 150.0–400.0)
RBC: 3.74 Mil/uL — ABNORMAL LOW (ref 4.22–5.81)
RDW: 15.3 % (ref 11.5–15.5)
WBC: 5.8 10*3/uL (ref 4.0–10.5)

## 2021-12-03 ENCOUNTER — Ambulatory Visit: Payer: Medicare HMO | Admitting: Gastroenterology

## 2021-12-03 ENCOUNTER — Encounter: Payer: Self-pay | Admitting: Gastroenterology

## 2021-12-03 VITALS — BP 140/72 | HR 84 | Ht 67.0 in | Wt 140.0 lb

## 2021-12-03 DIAGNOSIS — D62 Acute posthemorrhagic anemia: Secondary | ICD-10-CM | POA: Diagnosis not present

## 2021-12-03 DIAGNOSIS — K260 Acute duodenal ulcer with hemorrhage: Secondary | ICD-10-CM

## 2021-12-03 DIAGNOSIS — D509 Iron deficiency anemia, unspecified: Secondary | ICD-10-CM | POA: Diagnosis not present

## 2021-12-03 MED ORDER — FERROUS SULFATE 325 (65 FE) MG PO TABS
325.0000 mg | ORAL_TABLET | Freq: Every day | ORAL | 0 refills | Status: DC
Start: 2021-12-03 — End: 2022-08-20

## 2021-12-03 MED ORDER — PANTOPRAZOLE SODIUM 20 MG PO TBEC: 20.0000 mg | DELAYED_RELEASE_TABLET | Freq: Every day | ORAL | 11 refills | Status: DC

## 2021-12-03 NOTE — Patient Instructions (Signed)
We have sent the following medications to your pharmacy for you to pick up at your convenience: pantoprazole 20 mg daily and ferrous sulfate daily.  ? ?The  GI providers would like to encourage you to use [facility] to communicate with providers for non-urgent requests or questions.  Due to long hold times on the telephone, sending your provider a message by [facility] may be a faster and more efficient way to get a response.  Please allow 48 business hours for a response.  Please remember that this is for non-urgent requests.  ? ?Thank you for choosing me and [facility]. ? ?Malcolm T. Dagoberto Ligas., MD., Marval Regal ? ? ? ? ?

## 2021-12-03 NOTE — Progress Notes (Signed)
? ? ?  History of Present Illness: This is a 76 year old male recently hospitalized on February 28 with an upper GI bleed.  He presented with melena and anemia.  He received 3 units packed red blood cells.  EGD as below. Iron studies consistent with iron deficiency. Hgb 8.5 at discharge and 11.2 on 3/28. He was taking Mobic and NSAIDs which were stopped. H pylori IgG was negative.  He notes small flecks of black in his stool.  His energy level has improved.  His PCP started tramadol as needed for management of arthritis.  He relates ongoing back pain since December that would wake him in the middle the night and this pain has abated. ? ? ?EGD 10/31/2021 ?- Non-bleeding duodenal ulcer with a visible vessel. Injected. Treatment not successful. ?Treated with bipolar cautery. hemostatic spray applied. Bleeding stopped? ?- Non-bleeding duodenal ulcer with no stigmata of bleeding. ?- Small hiatal hernia. ?- The examination was otherwise normal. ?- No specimens collected. ? ?Current Medications, Allergies, Past Medical History, Past Surgical History, Family History and Social History were reviewed in [employer] record. ? ? ?Physical Exam: ?General: Well developed, well nourished, no acute distress ?Head: Normocephalic and atraumatic ?Eyes: Sclerae anicteric, EOMI ?Ears: Normal auditory acuity ?Mouth: Not examined, mask on during Covid-19 pandemic ?Lungs: Clear throughout to auscultation ?Heart: Regular rate and rhythm; no murmurs, rubs or bruits ?Abdomen: Soft, non tender and non distended. No masses, hepatosplenomegaly or hernias noted. Normal Bowel sounds ?Rectal: Not done ?Musculoskeletal: Symmetrical with no gross deformities  ?Pulses:  Normal pulses noted ?Extremities: No clubbing, cyanosis, edema or deformities noted ?Neurological: Alert oriented x 4, grossly nonfocal ?Psychological:  Alert and cooperative. Normal mood and affect ? ? ?Assessment and Recommendations: ? ?Duodenal ulcer status post  bleed.  Back pain since December has resolved and was likely due to his ulcer.  Change pantoprazole to 40 mg p.o. daily for long-term use.  Continue to avoid NSAIDs and Cox 2 agents.  H pylori Ab was negative. GI follow-up as needed.  Follow-up with PCP as planned. ?Acute blood loss anemia and iron deficiency anemia.  Hemoglobin has improved to 11.2.  Continue iron 325 mg once daily for 1 more month and then discontinue.  Small areas of black and slightly darker stool secondary to iron.  GI follow-up as needed. Follow-up with PCP.  S/P right middle and upper lobectomies, COPD  ?

## 2021-12-11 ENCOUNTER — Other Ambulatory Visit (HOSPITAL_BASED_OUTPATIENT_CLINIC_OR_DEPARTMENT_OTHER): Payer: Self-pay

## 2021-12-11 ENCOUNTER — Other Ambulatory Visit: Payer: Self-pay | Admitting: Family Medicine

## 2021-12-11 ENCOUNTER — Ambulatory Visit (INDEPENDENT_AMBULATORY_CARE_PROVIDER_SITE_OTHER): Payer: Medicare HMO | Admitting: Family

## 2021-12-11 DIAGNOSIS — K922 Gastrointestinal hemorrhage, unspecified: Secondary | ICD-10-CM

## 2021-12-11 DIAGNOSIS — M1990 Unspecified osteoarthritis, unspecified site: Secondary | ICD-10-CM

## 2021-12-11 DIAGNOSIS — I10 Essential (primary) hypertension: Secondary | ICD-10-CM | POA: Diagnosis not present

## 2021-12-11 MED ORDER — ACETAMINOPHEN 500 MG PO TABS
1000.0000 mg | ORAL_TABLET | Freq: Three times a day (TID) | ORAL | 0 refills | Status: DC | PRN
  Filled 2021-12-11: qty 100, 17d supply, fill #0

## 2021-12-11 NOTE — Assessment & Plan Note (Signed)
BP is improved. Still slightly above goal, but pt reports previous hx of syncope when bp meds were increased so he is hesitant to further adjust his medications. Will continue amlodipine 7.'5mg'$  once daily.  ?

## 2021-12-11 NOTE — Patient Instructions (Signed)
Please take tylenol '1000mg'$  every 8 hours.  ?Take tramadol '50mg'$  (1/2 tab) daily in the AM and daily in the PM for arthritis pain. ?Continue amlodipine 7.'5mg'$  once daily.  ?

## 2021-12-11 NOTE — Assessment & Plan Note (Signed)
Clinically resolved. H/H are slowly rising. He continues iron supplementation and NSAID avoidance.  ?

## 2021-12-11 NOTE — Progress Notes (Signed)
? ?Subjective:  ? ?By signing my name below, I, [name], attest that this documentation has been prepared under the direction and in the presence of Debbrah Alar NP, 12/11/2021 ? ? Patient ID: [name], male    DOB: [birth year], 76 y.o.   MRN: [id number] ? ?Chief Complaint  ?Patient presents with  ? Hypertension  ?  Here for follow up  ? Arthritis  ?  Complains of hip pain "right hip today, usual left hip ans knee pain"   ? ? ?HPI ?Patient is in today for an office visit. ? ?Strength - His strength has been slowly improving since his hospital discharge. He is slowly regaining weight.  ?Wt Readings from Last 3 Encounters:  ?12/11/21 142 lb (64.4 kg)  ?12/03/21 140 lb (63.5 kg)  ?11/13/21 139 lb 9.6 oz (63.3 kg)  ? ?Pain Symptoms - He currently takes 25 MG of Tramadol which helps alleviate his symptoms. He states that taking 50 MG of Tramadol makes him feel "woozy".  ? ?Blood Pressure - He has been taking 7.5 MG of Amlodipine. He states that he has been feeling pretty good. As of today's visit, his blood pressure reading is 142/60. ?BP Readings from Last 3 Encounters:  ?12/11/21 (!) 146/79  ?12/03/21 140/72  ?11/13/21 (!) 128/56  ? ?Stools - His stools are darkening. He went to visit Mr. [facility]o [facility] for his GI symptoms and was told that there is no immediate concern. ? ?Health Maintenance Due  ?Topic Date Due  ? URINE MICROALBUMIN  Never done  ? ? ?Past Medical History:  ?Diagnosis Date  ? Allergic rhinitis   ? Atypical chest pain 12/01/2014  ? Back pain 01/12/2013  ? COPD (chronic obstructive pulmonary disease) ([place])   ? FeV1 64%-2007  ? Dyspnea   ? Emphysema   ? GERD (gastroesophageal reflux disease)   ? History of lung abscess   ? bronchiectasis with RMLandRLL ersection -1996- Dr Arlyce Dice  ? Hordeolum externum (stye) 04/23/2015  ? Right eye  ? Hypertension   ? Impaired vision   ? glasses  ? Osteoarthritis   ? hands and knees  ? Sinusitis, acute 04/23/2015  ? ? ?Past Surgical History:  ?Procedure Laterality  Date  ? COLONOSCOPY  11/20/2010  ? Dr.Stark  ? ESOPHAGOGASTRODUODENOSCOPY (EGD) WITH PROPOFOL N/A 10/31/2021  ? Procedure: ESOPHAGOGASTRODUODENOSCOPY (EGD) WITH PROPOFOL;  Surgeon: Gatha Mayer, MD;  Location: [place];  Service: Gastroenterology;  Laterality: N/A;  ? HEMOSTASIS CONTROL  10/31/2021  ? Procedure: HEMOSTASIS CONTROL;  Surgeon: Gatha Mayer, MD;  [facility] END[facility];  Service: Gastroenterology;;  ? HERNIA REPAIR  10/03/2009  ? HOT HEMOSTASIS N/A 10/31/2021  ? Procedure: HOT HEMOSTASIS (ARGON PLASMA COAGULATION/BICAP);  Surgeon: Gatha Mayer, MD;  Location: [place];  Service: Gastroenterology;  Laterality: N/A;  ? left knee    ? lower back surgery  09/02/2008  ? 06/2009  ? LUNG SURGERY    ? RML andRUL removed due to bleeding and bronchiectasis and lung abcess  ? NECK SURGERY  05/03/2008  ? right foot surgery    ? SCLEROTHERAPY  10/31/2021  ? Procedure: SCLEROTHERAPY;  Surgeon: Gatha Mayer, MD;  Location: [facility] ENDOSCOPY;  Service: Gastroenterology;;  ? VIDEO BRONCHOSCOPY N/A 07/12/2020  ? Procedure: VIDEO BRONCHOSCOPY;  Surgeon: [name], MD;  Location: [place];  Service: Thoracic;  Laterality: N/A;  ? VIDEO BRONCHOSCOPY WITH ENDOBRONCHIAL NAVIGATION N/A 07/12/2020  ? Procedure: VIDEO BRONCHOSCOPY WITH ENDOBRONCHIAL NAVIGATION;  Surgeon: [name], MD;  Location: [facility] OR;  Service: Thoracic;  Laterality: N/A;  ? ? ?Family History  ?Problem Relation Age of Onset  ? Heart failure Mother   ?     age 76  ? Prostate cancer Father   ?     father died prostate ca  ? Obstructive Sleep Apnea Brother   ? Obesity Brother   ? Colon cancer Paternal Grandmother   ? Healthy Son   ? Coronary artery disease Other   ?     1st degree relative<60  ? Stroke Other   ?     1st degree relative<50  ? Esophageal cancer Neg Hx   ? Stomach cancer Neg Hx   ? ? ?Social History  ? ?Socioeconomic History  ? Marital status: Married  ?  Spouse name: Not on file  ? Number of children: Not on file  ?  Years of education: Not on file  ? Highest education level: Not on file  ?Occupational History  ? Occupation: retired Music therapist  ?Tobacco Use  ? Smoking status: Former  ?  Packs/day: 1.50  ?  Years: 38.00  ?  Pack years: 57.00  ?  Types: Cigarettes  ?  Quit date: 09/03/2000  ?  Years since quitting: 21.2  ?  Passive exposure: Past  ? Smokeless tobacco: Never  ?Vaping Use  ? Vaping Use: Never used  ?Substance and Sexual Activity  ? Alcohol use: Yes  ?  Alcohol/week: 1.0 standard drink  ?  Types: 1 Cans of beer per week  ?  Comment: 3 or 4 nights weekly  ? Drug use: No  ? Sexual activity: Never  ?Other Topics Concern  ? Not on file  ?Social History Narrative  ? Retired Music therapist  ? Patient states former smoker. 1 1/2 ppd x 38 yrs  Quit in Jan 2002  ? Married - 2 weeks (4th marriage)  ? divorced,  remarried 22-2004 (lost wife to lung ca),  remarried (divorced),   ? 1 son  - 40 [name])  ? Alcohol use-yes (2-3 beers per week)    ?   ?   ? ?Social Determinants of Health  ? ?Financial Resource Strain: Low Risk   ? Difficulty of Paying Living Expenses: Not very hard  ?Food Insecurity: No Food Insecurity  ? Worried About Charity fundraiser in the Last Year: Never true  ? Ran Out of Food in the Last Year: Never true  ?Transportation Needs: No Transportation Needs  ? Lack of Transportation (Medical): No  ? Lack of Transportation (Non-Medical): No  ?Physical Activity: Sufficiently Active  ? Days of Exercise per Week: 6 days  ? Minutes of Exercise per Session: 30 min  ?Stress: No Stress Concern Present  ? Feeling of Stress : Not at all  ?Social Connections: Moderately Integrated  ? Frequency of Communication with Friends and Family: More than three times a week  ? Frequency of Social Gatherings with Friends and Family: More than three times a week  ? Attends Religious Services: More than 4 times per year  ? Active Member of Clubs or Organizations: No  ? Attends Archivist Meetings: Never  ?  Marital Status: Married  ?Intimate Partner Violence: Not At Risk  ? Fear of Current or Ex-Partner: No  ? Emotionally Abused: No  ? Physically Abused: No  ? Sexually Abused: No  ? ? ?Outpatient Medications Prior to Visit  ?Medication Sig Dispense Refill  ? albuterol (VENTOLIN HFA) 108 (90  Base) MCG/ACT inhaler INHALE 2 PUFFS INTO THE LUNGS EVERY 6 HOURS AS NEEDED FOR WHEEZING OR SHORTNESS OF BREATH. (Patient taking differently: Inhale 2 puffs into the lungs every 6 (six) hours as needed for wheezing or shortness of breath.) 2 each 0  ? amLODipine (NORVASC) 5 MG tablet Take 1 & 1/2 tablet (7.5 mg total) by mouth daily. 135 tablet 1  ? benzonatate (TESSALON) 100 MG capsule Take 1 capsule (100 mg total) by mouth 3 (three) times daily as needed for cough. 40 capsule 2  ? calcium elemental as carbonate (BARIATRIC TUMS ULTRA) 400 MG chewable tablet Chew 1,000 mg by mouth 2 (two) times daily.     ? Capsaicin-Menthol (SALONPAS GEL EX) Apply 1 application topically daily as needed (arthritis pain).    ? cetirizine (ZYRTEC) 10 MG tablet Take 10 mg by mouth daily.    ? diclofenac Sodium (VOLTAREN) 1 % GEL Apply 2 g topically 3 (three) times daily as needed (pain).    ? ferrous sulfate 325 (65 FE) MG tablet Take 1 tablet (325 mg total) by mouth daily. 30 tablet 0  ? fluticasone (FLONASE) 50 MCG/ACT nasal spray USE 2 SPRAYS IN EACH NOSTRIL EVERY DAY 48 g 0  ? Glucosamine-Chondroitin 750-600 MG TABS Take 1 tablet by mouth 2 (two) times daily.    ? Guaifenesin 1200 MG TB12 Take 600 mg by mouth 2 (two) times daily.    ? imiquimod (ALDARA) 5 % cream Apply topically 3 (three) times a week. 12 each 1  ? loratadine (CLARITIN) 10 MG tablet Take 10 mg by mouth daily.    ? Menthol (ROBITUSSIN COUGH DROPS MT) Use as directed 1 lozenge in the mouth or throat 4 (four) times daily as needed (cough).     ? Menthol, Topical Analgesic, (BLUE-EMU MAXIMUM STRENGTH EX) Apply 1 application topically daily as needed (arthritis pain).     ? montelukast  (SINGULAIR) 10 MG tablet TAKE 1 TABLET EVERY DAY 90 tablet 3  ? Multiple Vitamin (MULTIVITAMIN WITH MINERALS) TABS tablet Take 1 tablet by mouth daily.     ? pantoprazole (PROTONIX) 20 MG tablet Take 1 tablet

## 2021-12-11 NOTE — Assessment & Plan Note (Signed)
Noted that he was drowsy on tramadol '50mg'$ . He did tolerate '25mg'$  but is afraid to use because he is afraid his pain will worsen over time. He often wakes up at 2 AM with knee pain despite tylenol.  ? ?I advised him as follows: ? ?Please take tylenol '1000mg'$  every 8 hours.  ?Take tramadol '50mg'$  (1/2 tab) daily in the AM and daily in the PM for arthritis pain. ?

## 2021-12-18 ENCOUNTER — Other Ambulatory Visit (HOSPITAL_BASED_OUTPATIENT_CLINIC_OR_DEPARTMENT_OTHER): Payer: Self-pay

## 2021-12-22 ENCOUNTER — Encounter: Payer: Self-pay | Admitting: Family

## 2021-12-24 MED ORDER — TRAMADOL HCL 50 MG PO TABS: 25.0000 mg | ORAL_TABLET | Freq: Two times a day (BID) | ORAL | 0 refills | Status: DC

## 2021-12-30 ENCOUNTER — Encounter: Payer: Self-pay | Admitting: Family

## 2021-12-31 DIAGNOSIS — L282 Other prurigo: Secondary | ICD-10-CM | POA: Diagnosis not present

## 2022-01-01 ENCOUNTER — Telehealth (INDEPENDENT_AMBULATORY_CARE_PROVIDER_SITE_OTHER): Payer: Medicare HMO | Admitting: Family

## 2022-01-01 VITALS — BP 115/88 | HR 98

## 2022-01-01 DIAGNOSIS — J0100 Acute maxillary sinusitis, unspecified: Secondary | ICD-10-CM

## 2022-01-01 MED ORDER — AMOXICILLIN-POT CLAVULANATE 875-125 MG PO TABS: 1.0000 | ORAL_TABLET | Freq: Two times a day (BID) | ORAL | 0 refills | Status: DC

## 2022-01-01 NOTE — Assessment & Plan Note (Addendum)
New. Continue antihistamine/flonase/nasal rinses. Add augmentin bid x 10 days. Call if symptoms worsen or if symptoms fail to improve. Also recommended that he complete a home covid test and call if positive.  ?

## 2022-01-01 NOTE — Progress Notes (Signed)
? ? ?MyChart Video Visit ? ? ? ?Virtual Visit via Video Note  ? ?This visit type was conducted due to national recommendations for restrictions regarding the COVID-19 Pandemic (e.g. social distancing) in an effort to limit this patient's exposure and mitigate transmission in our community. This patient is at least at moderate risk for complications without adequate follow up. This format is felt to be most appropriate for this patient at this time. Physical exam was limited by quality of the video and audio technology used for the visit. CMA was able to get the patient set up on a video visit. ? ?Patient location: Home Patient and provider in visit ?Provider location: Office ? ?I discussed the limitations of evaluation and management by telemedicine and the availability of in person appointments. The patient expressed understanding and agreed to proceed. ? ?Visit Date: 01/01/2022 ? ?Today's healthcare provider: Nance Pear, NP  ? ? ? ?Subjective:  ? ? Patient ID: [name], male    DOB: 1946-04-04, 76 y.o.   MRN: [id number] ? ?Chief Complaint  ?Patient presents with  ? Nasal Congestion  ?  Complains of nasal congestion with drainage   ? Sore Throat  ? Cough  ?  Complains of productive cough  ? ? ?Sore Throat  ?Associated symptoms include congestion.  ?Cough ?Associated symptoms include a sore throat.  ?Patient is in today for a virtual office visit ? ?Sinus Infection - He complains of a possible sinus infection. He states that he has a sore throat and nasal congestion. He noticed the nasal congestion during the middle of last week, he notes the mucus is yellow. He uses a sinus rinse to help alleviate symptoms. He also has sinus pain under his eyes, non-tender. He takes 50 MCG/ACT of Flonase, 10 MG of Zyrtec and 10 MG of Claritin. He has not taken a COVID 19 test.  ? ?Pain Medication - He discontinued use of Tramadol because it made him too sedated.  He has been taking Turmeric and states that it has  been relieving his joint pains.  ? ?Blood Pressure - His blood pressure is good. He is taking 5 MG of Amlodipine.  ?BP Readings from Last 3 Encounters:  ?01/01/22 115/88  ?12/11/21 (!) 146/79  ?12/03/21 140/72  ? ? ? ?Past Medical History:  ?Diagnosis Date  ? Allergic rhinitis   ? Atypical chest pain 12/01/2014  ? Back pain 01/12/2013  ? COPD (chronic obstructive pulmonary disease) ([place])   ? FeV1 64%-2007  ? Dyspnea   ? Emphysema   ? GERD (gastroesophageal reflux disease)   ? History of lung abscess   ? bronchiectasis with RMLandRLL ersection -1996- Dr Arlyce Dice  ? Hordeolum externum (stye) 04/23/2015  ? Right eye  ? Hypertension   ? Impaired vision   ? glasses  ? Osteoarthritis   ? hands and knees  ? Sinusitis, acute 04/23/2015  ? ? ?Past Surgical History:  ?Procedure Laterality Date  ? COLONOSCOPY  11/20/2010  ? Dr.Stark  ? ESOPHAGOGASTRODUODENOSCOPY (EGD) WITH PROPOFOL N/A 10/31/2021  ? Procedure: ESOPHAGOGASTRODUODENOSCOPY (EGD) WITH PROPOFOL;  Surgeon: Gatha Mayer, MD;  Location: [place];  Service: Gastroenterology;  Laterality: N/A;  ? HEMOSTASIS CONTROL  10/31/2021  ? Procedure: HEMOSTASIS CONTROL;  Surgeon: Gatha Mayer, MD;  Location: [facility] ENDOSCOPY;  Service: Gastroenterology;;  ? HERNIA REPAIR  10/03/2009  ? HOT HEMOSTASIS N/A 10/31/2021  ? Procedure: HOT HEMOSTASIS (ARGON PLASMA COAGULATION/BICAP);  Surgeon: Gatha Mayer, MD;  Location: [place];  Service:  Gastroenterology;  Laterality: N/A;  ? left knee    ? lower back surgery  09/02/2008  ? 06/2009  ? LUNG SURGERY    ? RML andRUL removed due to bleeding and bronchiectasis and lung abcess  ? NECK SURGERY  05/03/2008  ? right foot surgery    ? SCLEROTHERAPY  10/31/2021  ? Procedure: SCLEROTHERAPY;  Surgeon: Gatha Mayer, MD;  Location: [facility] ENDOSCOPY;  Service: Gastroenterology;;  ? VIDEO BRONCHOSCOPY N/A 07/12/2020  ? Procedure: VIDEO BRONCHOSCOPY;  Surgeon: [name], MD;  Location: [place];  Service: Thoracic;  Laterality: N/A;  ? VIDEO  BRONCHOSCOPY WITH ENDOBRONCHIAL NAVIGATION N/A 07/12/2020  ? Procedure: VIDEO BRONCHOSCOPY WITH ENDOBRONCHIAL NAVIGATION;  Surgeon: [name], MD;  Location: [place];  Service: Thoracic;  Laterality: N/A;  ? ? ?Family History  ?Problem Relation Age of Onset  ? Heart failure Mother   ?     age 42  ? Prostate cancer Father   ?     father died prostate ca  ? Obstructive Sleep Apnea Brother   ? Obesity Brother   ? Colon cancer Paternal Grandmother   ? Healthy Son   ? Coronary artery disease Other   ?     1st degree relative<60  ? Stroke Other   ?     1st degree relative<50  ? Esophageal cancer Neg Hx   ? Stomach cancer Neg Hx   ? ? ?Social History  ? ?Socioeconomic History  ? Marital status: Married  ?  Spouse name: Not on [name]  ? Number of children: Not on [name]  ? Years of education: Not on [name]  ? Highest education level: Not on [name]  ?Occupational History  ? Occupation: retired Music therapist  ?Tobacco Use  ? Smoking status: Former  ?  Packs/day: 1.50  ?  Years: 38.00  ?  Pack years: 57.00  ?  Types: Cigarettes  ?  Quit date: 09/03/2000  ?  Years since quitting: 21.3  ?  Passive exposure: Past  ? Smokeless tobacco: Never  ?Vaping Use  ? Vaping Use: Never used  ?Substance and Sexual Activity  ? Alcohol use: Yes  ?  Alcohol/week: 1.0 standard drink  ?  Types: 1 Cans of beer per week  ?  Comment: 3 or 4 nights weekly  ? Drug use: No  ? Sexual activity: Never  ?Other Topics Concern  ? Not on [name]  ?Social History Narrative  ? Retired Music therapist  ? Patient states former smoker. 1 1/2 ppd x 38 yrs  Quit in Jan 2002  ? Married - 2 weeks (4th marriage)  ? divorced,  remarried 98-2004 (lost wife to lung ca),  remarried (divorced),   ? 1 son  - 9 [name])  ? Alcohol use-yes (2-3 beers per week)    ?   ?   ? ?Social Determinants of Health  ? ?Financial Resource Strain: Low Risk   ? Difficulty of Paying Living Expenses: Not very hard  ?Food Insecurity: No Food Insecurity  ? Worried About Sales executive in the Last Year: Never true  ? Ran Out of Food in the Last Year: Never true  ?Transportation Needs: No Transportation Needs  ? Lack of Transportation (Medical): No  ? Lack of Transportation (Non-Medical): No  ?Physical Activity: Sufficiently Active  ? Days of Exercise per Week: 6 days  ? Minutes of Exercise per Session: 30 min  ?Stress: No Stress Concern Present  ? Feeling  of Stress : Not at all  ?Social Connections: Moderately Integrated  ? Frequency of Communication with Friends and Family: More than three times a week  ? Frequency of Social Gatherings with Friends and Family: More than three times a week  ? Attends Religious Services: More than 4 times per year  ? Active Member of Clubs or Organizations: No  ? Attends Archivist Meetings: Never  ? Marital Status: Married  ?Intimate Partner Violence: Not At Risk  ? Fear of Current or Ex-Partner: No  ? Emotionally Abused: No  ? Physically Abused: No  ? Sexually Abused: No  ? ? ?Outpatient Medications Prior to Visit  ?Medication Sig Dispense Refill  ? acetaminophen (TYLENOL) 500 MG tablet Take 2 tablets (1,000 mg total) by mouth every 8 (eight) hours as needed for moderate pain. 100 tablet 0  ? albuterol (VENTOLIN HFA) 108 (90 Base) MCG/ACT inhaler INHALE 2 PUFFS INTO THE LUNGS EVERY 6 HOURS AS NEEDED FOR WHEEZING OR SHORTNESS OF BREATH. (Patient taking differently: Inhale 2 puffs into the lungs every 6 (six) hours as needed for wheezing or shortness of breath.) 2 each 0  ? amLODipine (NORVASC) 5 MG tablet Take 1 & 1/2 tablet (7.5 mg total) by mouth daily. 135 tablet 1  ? calcium elemental as carbonate (BARIATRIC TUMS ULTRA) 400 MG chewable tablet Chew 1,000 mg by mouth 2 (two) times daily.     ? cetirizine (ZYRTEC) 10 MG tablet Take 10 mg by mouth daily.    ? ferrous sulfate 325 (65 FE) MG tablet Take 1 tablet (325 mg total) by mouth daily. 30 tablet 0  ? fluticasone (FLONASE) 50 MCG/ACT nasal spray USE 2 SPRAYS IN EACH NOSTRIL EVERY DAY 48 g 0  ?  Glucosamine-Chondroitin 750-600 MG TABS Take 1 tablet by mouth 2 (two) times daily.    ? Guaifenesin 1200 MG TB12 Take 600 mg by mouth 2 (two) times daily.    ? loratadine (CLARITIN) 10 MG tablet Take 10 m

## 2022-01-09 ENCOUNTER — Other Ambulatory Visit: Payer: Self-pay | Admitting: Thoracic Surgery (Cardiothoracic Vascular Surgery)

## 2022-01-09 NOTE — Telephone Encounter (Signed)
Pt had video visit on 5/2 ?

## 2022-01-29 ENCOUNTER — Telehealth: Payer: Self-pay | Admitting: Family Medicine

## 2022-01-29 DIAGNOSIS — G8929 Other chronic pain: Secondary | ICD-10-CM

## 2022-01-29 NOTE — Telephone Encounter (Signed)
Referral has been entered.

## 2022-01-29 NOTE — Telephone Encounter (Signed)
Pt states he was supposed to have a referral for right shoulder pain but was unable to go due to hospitalization. He would like that referral placed again.

## 2022-01-29 NOTE — Addendum Note (Signed)
Addended by: Lynnea Ferrier R on: 01/29/2022 04:52 PM   Modules accepted: Orders

## 2022-01-29 NOTE — Telephone Encounter (Signed)
Pt states he does not think sports meds will be able to help him. He would like a referral placed for surgery.

## 2022-01-29 NOTE — Telephone Encounter (Signed)
Referral ordered for orthopedic surgery.

## 2022-01-31 ENCOUNTER — Other Ambulatory Visit: Payer: Self-pay

## 2022-01-31 MED ORDER — BENZONATATE 100 MG PO CAPS
ORAL_CAPSULE | ORAL | 1 refills | Status: DC
Start: 2022-01-31 — End: 2022-04-12

## 2022-02-07 ENCOUNTER — Ambulatory Visit (INDEPENDENT_AMBULATORY_CARE_PROVIDER_SITE_OTHER): Payer: Medicare HMO

## 2022-02-07 ENCOUNTER — Ambulatory Visit: Payer: Medicare HMO | Admitting: Orthopaedic Surgery

## 2022-02-07 ENCOUNTER — Encounter: Payer: Self-pay | Admitting: Orthopaedic Surgery

## 2022-02-07 DIAGNOSIS — M25561 Pain in right knee: Secondary | ICD-10-CM | POA: Diagnosis not present

## 2022-02-07 DIAGNOSIS — G8929 Other chronic pain: Secondary | ICD-10-CM

## 2022-02-07 DIAGNOSIS — M25511 Pain in right shoulder: Secondary | ICD-10-CM

## 2022-02-07 NOTE — Progress Notes (Signed)
Office Visit Note   Patient: [name]           Date of Birth: [birth year]           MRN: [id number] Visit Date: 02/07/2022              Requested by: Mosie Lukes, MD [address] [place],  [place] [address] PCP: Mosie Lukes, MD   Assessment & Plan: Visit Diagnoses:  1. Chronic pain of right knee   2. Chronic right shoulder pain     Plan: Right shoulder findings consistent with rotator cuff arthropathy.  He has a prior x-ray shows superior migration and acetabular rotation of the acromion.  Treatment options were discussed and he elected to have a cortisone injection which we placed in the glenohumeral joint today through an anterior approach.  He had good relief during the anesthetic phase.  Right knee findings are consistent with valgus DJD and joint effusion.  He underwent right knee aspiration and cortisone injection today.  35 cc of synovial fluid obtained and sent to the lab.  Follow-up as needed.  Follow-Up Instructions: No follow-ups on file.   Orders:  Orders Placed This Encounter  Procedures   XR KNEE 3 VIEW RIGHT   No orders of the defined types were placed in this encounter.     Procedures: No procedures performed   Clinical Data: No additional findings.   Subjective: Chief Complaint  Patient presents with   Right Shoulder - Pain   Right Knee - Pain    HPI Patient is a very pleasant and active 76 year old gentleman who comes in for evaluation of right shoulder and knee pain for years.  Denies any previous surgeries or true injuries.  He has fallen off horses in the past but not sure if this is related.  Has pain and decreased range of motion and weakness with raising of his right arm.  He was taking meloxicam for years which helped alleviate the pain but had to stop due to GI bleeding.  Now takes Tylenol which is not as effective.  Review of Systems  Constitutional: Negative.   All other systems reviewed and are  negative.    Objective: Vital Signs: There were no vitals taken for this visit.  Physical Exam Vitals and nursing note reviewed.  Constitutional:      Appearance: He is well-developed.  HENT:     Head: Normocephalic and atraumatic.  Eyes:     Pupils: Pupils are equal, round, and reactive to light.  Pulmonary:     Effort: Pulmonary effort is normal.  Abdominal:     Palpations: Abdomen is soft.  Musculoskeletal:        General: Normal range of motion.     Cervical back: Neck supple.  Skin:    General: Skin is warm.  Neurological:     Mental Status: He is alert and oriented to person, place, and time.  Psychiatric:        Behavior: Behavior normal.        Thought Content: Thought content normal.        Judgment: Judgment normal.     Ortho Exam Examination of right shoulder shows crepitus and pain with passive and active range of motion.  Active elevation of the arm to about 85 degrees with shrugging.  Manual muscle testing the rotator cuff shows significant weakness and deficiency and pain. Examination right right knee shows a large joint effusion.  No  signs of infection.  Collaterals and cruciates are stable.  Lateral joint line tenderness. Specialty Comments:  No specialty comments available.  Imaging: XR KNEE 3 VIEW RIGHT  Result Date: 02/07/2022 Moderately severe mild valgus DJD.    PMFS History: Patient Active Problem List   Diagnosis Date Noted   Chronic pain syndrome 11/06/2021   Hypokalemia 11/03/2021   Protein-calorie malnutrition, severe 11/02/2021   Bleeding hemorrhoids    Acute duodenal ulcer with hemorrhage    Acute upper GI bleed 10/30/2021   Anemia due to acute blood loss 10/30/2021   DDD (degenerative disc disease), lumbar 10/30/2021   Osteoarthritis 10/30/2021   Sedimentation rate elevation 10/16/2021   CRP elevated 10/16/2021   Hemoptysis 06/26/2020   Hyperlipidemia 12/23/2019   Recurrent sinusitis 09/28/2018   High risk medication use  09/28/2018   Solitary pulmonary nodule on lung CT 07/06/2018   Arthritis of left knee 12/18/2017   Anemia 09/15/2017   Left knee pain 09/15/2017   Chronic arthralgias of knees and hips 09/15/2017   Bilateral shoulder pain 09/15/2017   Nocturia 09/15/2017   Hyperglycemia 09/15/2017   Skin lesion 09/15/2017   H/O tobacco use, presenting hazards to health 09/15/2017   Hx of migraines 09/28/2016   COPD exacerbation ([place]) 04/24/2015   Acute maxillary sinusitis 04/23/2015   Atypical chest pain 12/01/2014   Screening for ischemic heart disease 07/25/2014   Prostate cancer screening 07/25/2014   Preventative health care 07/25/2014   Asymptomatic PVCs 07/25/2014   Arthritis 10/18/2013   Incomplete RBBB 07/19/2013   Back pain 01/12/2013   Annual physical exam 07/21/2011   Carotid bruit 07/21/2011   Benign essential HTN 05/23/2010   Allergic rhinitis 07/07/2007   COPD GOLD II D 07/07/2007   GERD 07/07/2007   Past Medical History:  Diagnosis Date   Allergic rhinitis    Atypical chest pain 12/01/2014   Back pain 01/12/2013   COPD (chronic obstructive pulmonary disease) (HCC)    FeV1 64%-2007   Dyspnea    Emphysema    GERD (gastroesophageal reflux disease)    History of lung abscess    bronchiectasis with RMLandRLL ersection -1996- Dr Arlyce Dice   Hordeolum externum (stye) 04/23/2015   Right eye   Hypertension    Impaired vision    glasses   Osteoarthritis    hands and knees   Sinusitis, acute 04/23/2015    Family History  Problem Relation Age of Onset   Heart failure Mother        age 40   Prostate cancer Father        father died prostate ca   Obstructive Sleep Apnea Brother    Obesity Brother    Colon cancer Paternal Grandmother    Healthy Son    Coronary artery disease Other        1st degree relative<60   Stroke Other        1st degree relative<50   Esophageal cancer Neg Hx    Stomach cancer Neg Hx     Past Surgical History:  Procedure Laterality Date   COLONOSCOPY   11/20/2010   Dr.Stark   ESOPHAGOGASTRODUODENOSCOPY (EGD) WITH PROPOFOL N/A 10/31/2021   Procedure: ESOPHAGOGASTRODUODENOSCOPY (EGD) WITH PROPOFOL;  Surgeon: Gatha Mayer, MD;  Location: [place] [place];  Service: Gastroenterology;  Laterality: N/A;   HEMOSTASIS CONTROL  10/31/2021   Procedure: HEMOSTASIS CONTROL;  Surgeon: Gatha Mayer, MD;  Location: [place];  Service: Gastroenterology;;   HERNIA REPAIR  10/03/2009   HOT HEMOSTASIS N/A 10/31/2021  Procedure: HOT HEMOSTASIS (ARGON PLASMA COAGULATION/BICAP);  Surgeon: Gatha Mayer, MD;  Location: [facility];  Service: Gastroenterology;  Laterality: N/A;   left knee     lower back surgery  09/02/2008   06/2009   LUNG SURGERY     RML andRUL removed due to bleeding and bronchiectasis and lung abcess   NECK SURGERY  05/03/2008   right foot surgery     SCLEROTHERAPY  10/31/2021   Procedure: SCLEROTHERAPY;  Surgeon: Gatha Mayer, MD;  Location: [facility] END[facility]COPY;  Service: Gastroenterology;;   VIDEO BRONCHOSCOPY N/A 07/12/2020   Procedure: VIDEO BRONCHOSCOPY;  Surgeon: [name], MD;  Location: [place];  Service: Thoracic;  Laterality: N/A;   VIDEO BRONCHOSCOPY WITH ENDOBRONCHIAL NAVIGATION N/A 07/12/2020   Procedure: VIDEO BRONCHOSCOPY WITH ENDOBRONCHIAL NAVIGATION;  Surgeon: [name], MD;  Location: [place];  Service: Thoracic;  Laterality: N/A;   Social History   Occupational History   Occupation: retired Music therapist  Tobacco Use   Smoking status: Former    Packs/day: 1.50    Years: 38.00    Total pack years: 57.00    Types: Cigarettes    Quit date: 09/03/2000    Years since quitting: 21.4    Passive exposure: Past   Smokeless tobacco: Never  Vaping Use   Vaping Use: Never used  Substance and Sexual Activity   Alcohol use: Yes    Alcohol/week: 1.0 standard drink of alcohol    Types: 1 Cans of beer per week    Comment: 3 or 4 nights weekly   Drug use: No   Sexual activity: Never

## 2022-02-08 LAB — SYNOVIAL FLUID ANALYSIS, COMPLETE
Basophils, %: 0 %
Eosinophils-Synovial: 0 % (ref 0–2)
Lymphocytes-Synovial Fld: 34 % (ref 0–74)
Monocyte/Macrophage: 53 % (ref 0–69)
Neutrophil, Synovial: 13 % (ref 0–24)
Synoviocytes, %: 0 % (ref 0–15)
WBC, Synovial: 158 cells/uL — ABNORMAL HIGH (ref <150)

## 2022-02-08 LAB — TIQ-NTM

## 2022-02-16 ENCOUNTER — Other Ambulatory Visit: Payer: Self-pay | Admitting: Family Medicine

## 2022-03-06 ENCOUNTER — Ambulatory Visit: Payer: Medicare HMO

## 2022-03-08 ENCOUNTER — Ambulatory Visit (INDEPENDENT_AMBULATORY_CARE_PROVIDER_SITE_OTHER): Payer: Medicare HMO

## 2022-03-08 ENCOUNTER — Ambulatory Visit: Payer: Medicare HMO

## 2022-03-08 DIAGNOSIS — Z0000 Encounter for general adult medical examination without abnormal findings: Secondary | ICD-10-CM | POA: Diagnosis not present

## 2022-03-08 NOTE — Progress Notes (Addendum)
Subjective:   [name] is a 76 y.o. male who presents for Medicare Annual/Subsequent preventive examination.  I connected with  [name] on 03/08/22 by a audio enabled telemedicine application and verified that I am speaking with the correct person using two identifiers.  Patient Location: Home  Provider Location: Office/Clinic  I discussed the limitations of evaluation and management by telemedicine. The patient expressed understanding and agreed to proceed.   Review of Systems     Cardiac Risk Factors include: hypertension;dyslipidemia     Objective:    Today's Vitals   03/08/22 1305  PainSc: 8    There is no height or weight on file to calculate BMI.     03/08/2022    1:03 PM 11/01/2021    8:52 AM 10/30/2021    4:21 PM 02/28/2021    8:23 AM 07/10/2020    1:55 PM 02/28/2020    9:36 AM 09/30/2016    9:07 AM  Advanced Directives  Does Patient Have a Medical Advance Directive? Yes  No Yes Yes Yes   Type of Paramedic of [place];Living will   [place];Living will Living will [place];Living will [place]  Does patient want to make changes to medical advance directive? No - Patient declined     No - Patient declined   Copy of [place] in Chart? Yes - validated most recent copy scanned in chart (See row information)   Yes - validated most recent copy scanned in chart (See row information)  Yes - validated most recent copy scanned in chart (See row information)   Would patient like information on creating a medical advance directive?  Yes (Inpatient - patient defers creating a medical advance directive and declines information at this time)         Current Medications (verified) Outpatient Encounter Medications as of 03/08/2022  Medication Sig   acetaminophen (TYLENOL) 500 MG tablet Take 2 tablets (1,000 mg total) by mouth every 8 (eight) hours as needed for moderate  pain.   albuterol (VENTOLIN HFA) 108 (90 Base) MCG/ACT inhaler INHALE 2 PUFFS INTO THE LUNGS EVERY 6 HOURS AS NEEDED FOR WHEEZING OR SHORTNESS OF BREATH. (Patient taking differently: Inhale 2 puffs into the lungs every 6 (six) hours as needed for wheezing or shortness of breath.)   amLODipine (NORVASC) 5 MG tablet Take 1 & 1/2 tablet (7.5 mg total) by mouth daily.   benzonatate (TESSALON) 100 MG capsule TAKE 1 CAPSULE BY MOUTH TWICE DAILY AS NEEDED FOR COUGH   calcium elemental as carbonate (BARIATRIC TUMS ULTRA) 400 MG chewable tablet Chew 1,000 mg by mouth 2 (two) times daily.    cetirizine (ZYRTEC) 10 MG tablet Take 10 mg by mouth daily.   ferrous sulfate 325 (65 FE) MG tablet Take 1 tablet (325 mg total) by mouth daily.   fluticasone (FLONASE) 50 MCG/ACT nasal spray USE 2 SPRAYS IN EACH NOSTRIL EVERY DAY   Glucosamine-Chondroitin 750-600 MG TABS Take 1 tablet by mouth 2 (two) times daily.   Guaifenesin 1200 MG TB12 Take 600 mg by mouth 2 (two) times daily.   loratadine (CLARITIN) 10 MG tablet Take 10 mg by mouth daily.   Menthol, Topical Analgesic, (BLUE-EMU MAXIMUM STRENGTH EX) Apply 1 application topically daily as needed (arthritis pain).    Multiple Vitamin (MULTIVITAMIN WITH MINERALS) TABS tablet Take 1 tablet by mouth daily.    pantoprazole (PROTONIX) 20 MG tablet Take 1 tablet (  20 mg total) by mouth daily.   TRELEGY ELLIPTA 100-62.5-25 MCG/ACT AEPB INHALE 1 PUFF INTO THE LUNGS DAILY   vitamin B-12 (CYANOCOBALAMIN) 1000 MCG tablet Take 1 tablet (1,000 mcg total) by mouth daily.   [DISCONTINUED] amoxicillin-clavulanate (AUGMENTIN) 875-125 MG tablet Take 1 tablet by mouth 2 (two) times daily.   No facility-administered encounter medications on file as of 03/08/2022.    Allergies (verified) Losartan and Meloxicam   History: Past Medical History:  Diagnosis Date   Allergic rhinitis    Atypical chest pain 12/01/2014   Back pain 01/12/2013   COPD (chronic obstructive pulmonary disease)  (HCC)    FeV1 64%-2007   Dyspnea    Emphysema    GERD (gastroesophageal reflux disease)    History of lung abscess    bronchiectasis with RMLandRLL ersection -1996- Dr Arlyce Dice   Hordeolum externum (stye) 04/23/2015   Right eye   Hypertension    Impaired vision    glasses   Osteoarthritis    hands and knees   Sinusitis, acute 04/23/2015   Past Surgical History:  Procedure Laterality Date   COLONOSCOPY  11/20/2010   Dr.Stark   ESOPHAGOGASTRODUODENOSCOPY (EGD) WITH PROPOFOL N/A 10/31/2021   Procedure: ESOPHAGOGASTRODUODENOSCOPY (EGD) WITH PROPOFOL;  Surgeon: Gatha Mayer, MD;  Location: [place];  Service: Gastroenterology;  Laterality: N/A;   HEMOSTASIS CONTROL  10/31/2021   Procedure: HEMOSTASIS CONTROL;  Surgeon: Gatha Mayer, MD;  Location: [place];  Service: Gastroenterology;;   HERNIA REPAIR  10/03/2009   HOT HEMOSTASIS N/A 10/31/2021   Procedure: HOT HEMOSTASIS (ARGON PLASMA COAGULATION/BICAP);  Surgeon: Gatha Mayer, MD;  Location: [place];  Service: Gastroenterology;  Laterality: N/A;   left knee     lower back surgery  09/02/2008   06/2009   LUNG SURGERY     RML andRUL removed due to bleeding and bronchiectasis and lung abcess   NECK SURGERY  05/03/2008   right foot surgery     SCLEROTHERAPY  10/31/2021   Procedure: SCLEROTHERAPY;  Surgeon: Gatha Mayer, MD;  Location: [facility] ENDOSCOPY;  Service: Gastroenterology;;   VIDEO BRONCHOSCOPY N/A 07/12/2020   Procedure: VIDEO BRONCHOSCOPY;  Surgeon: [name], MD;  Location: [facility] OR;  Service: Thoracic;  Laterality: N/A;   VIDEO BRONCHOSCOPY WITH ENDOBRONCHIAL NAVIGATION N/A 07/12/2020   Procedure: VIDEO BRONCHOSCOPY WITH ENDOBRONCHIAL NAVIGATION;  Surgeon: [name], MD;  Location: [facility] OR;  Service: Thoracic;  Laterality: N/A;   Family History  Problem Relation Age of Onset   Heart failure Mother        age 74   Prostate cancer Father        father died prostate ca   Obstructive Sleep  Apnea Brother    Obesity Brother    Colon cancer Paternal Grandmother    Healthy Son    Coronary artery disease Other        1st degree relative<60   Stroke Other        1st degree relative<50   Esophageal cancer Neg Hx    Stomach cancer Neg Hx    Social History   Socioeconomic History   Marital status: Married    Spouse name: Not on file   Number of children: Not on file   Years of education: Not on file   Highest education level: Not on file  Occupational History   Occupation: retired Music therapist  Tobacco Use   Smoking status: Former    Packs/day: 1.50    Years: 38.00  Total pack years: 57.00    Types: Cigarettes    Quit date: 09/03/2000    Years since quitting: 21.5    Passive exposure: Past   Smokeless tobacco: Never  Vaping Use   Vaping Use: Never used  Substance and Sexual Activity   Alcohol use: Yes    Alcohol/week: 1.0 standard drink of alcohol    Types: 1 Cans of beer per week    Comment: 3 or 4 nights weekly   Drug use: No   Sexual activity: Never  Other Topics Concern   Not on file  Social History Narrative   Retired Music therapist   Patient states former smoker. 1 1/2 ppd x 38 yrs  Quit in Jan 2002   Married - 2 weeks (4th marriage)   divorced,  remarried 84-2004 (lost wife to lung ca),  remarried (divorced),    1 son  - 54 [name])   Alcohol use-yes (2-3 beers per week)           Social Determinants of Health   Financial Resource Strain: Low Risk  (11/01/2021)   Overall Financial Resource Strain (CARDIA)    Difficulty of Paying Living Expenses: Not very hard  Food Insecurity: No Food Insecurity (02/28/2021)   Hunger Vital Sign    Worried About Running Out of Food in the Last Year: Never true    Ran Out of Food in the Last Year: Never true  Transportation Needs: No Transportation Needs (02/28/2021)   PRAPARE - Hydrologist (Medical): No    Lack of Transportation (Non-Medical): No  Physical Activity:  Sufficiently Active (02/28/2021)   Exercise Vital Sign    Days of Exercise per Week: 6 days    Minutes of Exercise per Session: 30 min  Stress: No Stress Concern Present (02/28/2021)   [place]    Feeling of Stress : Not at all  Social Connections: Moderately Integrated (02/28/2021)   Social Connection and Isolation Panel [NHANES]    Frequency of Communication with Friends and Family: More than three times a week    Frequency of Social Gatherings with Friends and Family: More than three times a week    Attends Religious Services: More than 4 times per year    Active Member of [employer] or Organizations: No    Attends Music therapist: Never    Marital Status: Married    Tobacco Counseling Counseling given: Not Answered   Clinical Intake:  Pre-visit preparation completed: Yes  Pain : 0-10 Pain Score: 8  Pain Type: Chronic pain Pain Location: Other (Comment) (knees, shoulder, hips, and hands) Pain Descriptors / Indicators: Aching, Dull, Sore     BMI - recorded: 22.24 Nutritional Risks: None Diabetes: No  How often do you need to have someone help you when you read instructions, pamphlets, or other written materials from your doctor or pharmacy?: 1 - Never  Diabetic?no  Interpreter Needed?: No  Information entered by :: Shakira Lynnwood Beckford   Activities of Daily Living    03/08/2022    1:08 PM 11/01/2021    8:53 AM  In your present state of health, do you have any difficulty performing the following activities:  Hearing? 0 0  Vision? 0 0  Comment  Wears glasses and they help  Difficulty concentrating or making decisions? 0 0  Walking or climbing stairs? 1 0  Comment  Uses a cane at home, does not use walker.  Dressing  or bathing? 1 0  Comment  Has arthritis only thing that doesn't help is buttoning.  Doing errands, shopping? 0 0  Preparing Food and eating ? N   Using the Toilet? N   In the past  six months, have you accidently leaked urine? N   Do you have problems with loss of bowel control? N   Managing your Medications? N   Managing your Finances? N   Housekeeping or managing your Housekeeping? N     Patient Care Team: Mosie Lukes, MD as PCP - General (Family Medicine) Josue Hector, MD as PCP - Cardiology (Cardiology) Elsie Stain, MD as Attending Physician (Pulmonary Disease)  Indicate any recent Medical Services you may have received from other than [facility] providers in the past year (date may be approximate).     Assessment:   This is a routine wellness examination for Rei.  Hearing/Vision screen No results found.  Dietary issues and exercise activities discussed: Current Exercise Habits: Home exercise routine, Time (Minutes): 25, Frequency (Times/Week): 7, Weekly Exercise (Minutes/Week): 175, Intensity: Mild, Exercise limited by: None identified   Goals Addressed   None    Depression Screen    03/08/2022    1:04 PM 02/28/2021    8:24 AM 02/28/2020    9:40 AM 12/27/2019    9:53 AM 09/15/2017   10:40 AM 09/10/2016    1:13 PM 11/07/2014   10:43 AM  PHQ 2/9 Scores  PHQ - 2 Score 0 0 0 0 0 0 0  PHQ- 9 Score    0       Fall Risk    03/08/2022    1:04 PM 11/13/2021   12:57 PM 02/28/2021    8:23 AM 02/28/2020    9:40 AM 09/15/2017   10:40 AM  Fall Risk   Falls in the past year? 1 0 1 0 No  Number falls in past yr: 1 0 1 0   Injury with Fall? 0 0 0 0   Risk for fall due to : History of fall(s) No Fall Risks History of fall(s)    Follow up Falls evaluation completed Falls evaluation completed Falls prevention discussed Education provided;Falls prevention discussed     FALL RISK PREVENTION PERTAINING TO THE HOME:  Any stairs in or around the home? Yes  If so, are there any without handrails? No  Home free of loose throw rugs in walkways, pet beds, electrical cords, etc? Yes  Adequate lighting in your home to reduce risk of falls? Yes   ASSISTIVE  DEVICES UTILIZED TO PREVENT FALLS:  Life alert? No  Use of a cane, walker or w/c? Yes  sometimes Grab bars in the bathroom? Yes  Shower chair or bench in shower? No  Elevated toilet seat or a handicapped toilet? No   TIMED UP AND GO:  Was the test performed? No .    Cognitive Function:    09/10/2016    1:13 PM  MMSE - Mini Mental State Exam  Orientation to time 5  Orientation to Place 5  Registration 3  Attention/ Calculation 5  Recall 3  Language- name 2 objects 2  Language- repeat 1  Language- follow 3 step command 3  Language- read & follow direction 1  Write a sentence 1  Copy design 1  Total score 30        03/08/2022    1:11 PM  6CIT Screen  What Year? 0 points  What month? 0 points  What time?  0 points  Count back from 20 0 points  Months in reverse 0 points  Repeat phrase 0 points  Total Score 0 points    Immunizations Immunization History  Administered Date(s) Administered   Influenza Split 06/02/2012   Influenza Whole 05/15/2010, 05/02/2011, 05/18/2013   Influenza, High Dose Seasonal PF 04/20/2017, 05/05/2018, 04/30/2019   Influenza,inj,Quad PF,6+ Mos 05/14/2014, 05/25/2015   Influenza-Unspecified 05/15/2016, 05/23/2021   PFIZER(Purple Top)SARS-COV-2 Vaccination 09/27/2019, 10/18/2019, 05/23/2020   Pfizer Covid-19 Vaccine Bivalent Booster 96yr & up 05/23/2021   Pneumococcal Conjugate-13 05/20/2013, 05/15/2016   Pneumococcal Polysaccharide-23 07/03/2001, 05/02/2011, 05/20/2018   Td 05/15/2004   Tdap 02/17/2014   Zoster Recombinat (Shingrix) 06/26/2020, 09/20/2020   Zoster, Live 07/16/2011    TDAP status: Up to date  Flu Vaccine status: Up to date  Pneumococcal vaccine status: Up to date  Covid-19 vaccine status: Completed vaccines  Qualifies for Shingles Vaccine? Yes   Zostavax completed No   Shingrix Completed?: Yes  Screening Tests Health Maintenance  Topic Date Due   URINE MICROALBUMIN  Never done   INFLUENZA VACCINE  04/02/2022    TETANUS/TDAP  02/18/2024   COLONOSCOPY (Pts 45-436yrInsurance coverage will need to be confirmed)  07/17/2031   Pneumonia Vaccine 6533Years old  Completed   COVID-19 Vaccine  Completed   Hepatitis C Screening  Completed   Zoster Vaccines- Shingrix  Completed   HPV VACCINES  Aged Out    Health Maintenance  Health Maintenance Due  Topic Date Due   URINE MICROALBUMIN  Never done    Colorectal cancer screening: Type of screening: Colonoscopy. Completed 07/16/21. Repeat every 10 years  Lung Cancer Screening: (Low Dose CT Chest recommended if Age 75-80ears, 30 pack-year currently smoking OR have quit w/in 15years.) does qualify.   Lung Cancer Screening Referral: 10/30/21  Additional Screening:  Hepatitis C Screening: does qualify; Completed 07/26/15  Vision Screening: Recommended annual ophthalmology exams for early detection of glaucoma and other disorders of the eye. Is the patient up to date with their annual eye exam?  No  Who is the provider or what is the name of the office in which the patient attends annual eye exams? N/a If pt is not established with a provider, would they like to be referred to a provider to establish care? No .   Dental Screening: Recommended annual dental exams for proper oral hygiene  Community Resource Referral / Chronic Care Management: CRR required this visit?  No   CCM required this visit?  No      Plan:     I have personally reviewed and noted the following in the patient's chart:   Medical and social history Use of alcohol, tobacco or illicit drugs  Current medications and supplements including opioid prescriptions. Patient is not currently taking opioid prescriptions. Functional ability and status Nutritional status Physical activity Advanced directives List of other physicians Hospitalizations, surgeries, and ER visits in previous 12 months Vitals Screenings to include cognitive, depression, and falls Referrals and  appointments  In addition, I have reviewed and discussed with patient certain preventive protocols, quality metrics, and best practice recommendations. A written personalized care plan for preventive services as well as general preventive health recommendations were provided to patient.   Due to this being a telephonic visit, the after visit summary with patients personalized plan was offered to patient via mail or my-chart.  Patient would like to access on my-chart.  ShDuard Bradyhism, CMCarlisle 03/08/2022   Nurse Notes: none

## 2022-03-08 NOTE — Patient Instructions (Signed)
[name] , Thank you for taking time to come for your Medicare Wellness Visit. I appreciate your ongoing commitment to your health goals. Please review the following plan we discussed and let me know if I can assist you in the future.   Screening recommendations/referrals: Colonoscopy: 07/16/21 due 07/17/31 Recommended yearly ophthalmology/optometry visit for glaucoma screening and checkup Recommended yearly dental visit for hygiene and checkup  Vaccinations: Influenza vaccine: up to date Pneumococcal vaccine: up to date Tdap vaccine: up to date Shingles vaccine: up to date   Covid-19: completed  Advanced directives: yes, on file  Conditions/risks identified: see problem list   Next appointment: Follow up in one year for your annual wellness visit.   Preventive Care 75 Years and Older, Male Preventive care refers to lifestyle choices and visits with your health care provider that can promote health and wellness. What does preventive care include? A yearly physical exam. This is also called an annual well check. Dental exams once or twice a year. Routine eye exams. Ask your health care provider how often you should have your eyes checked. Personal lifestyle choices, including: Daily care of your teeth and gums. Regular physical activity. Eating a healthy diet. Avoiding tobacco and drug use. Limiting alcohol use. Practicing safe sex. Taking low doses of aspirin every day. Taking vitamin and mineral supplements as recommended by your health care provider. What happens during an annual well check? The services and screenings done by your health care provider during your annual well check will depend on your age, overall health, lifestyle risk factors, and family history of disease. Counseling  Your health care provider may ask you questions about your: Alcohol use. Tobacco use. Drug use. Emotional well-being. Home and relationship well-being. Sexual activity. Eating  habits. History of falls. Memory and ability to understand (cognition). Work and work Statistician. Screening  You may have the following tests or measurements: Height, weight, and BMI. Blood pressure. Lipid and cholesterol levels. These may be checked every 5 years, or more frequently if you are over 75 years old. Skin check. Lung cancer screening. You may have this screening every year starting at age 75 if you have a 30-pack-year history of smoking and currently smoke or have quit within the past 15 years. Fecal occult blood test (FOBT) of the stool. You may have this test every year starting at age 75. Flexible sigmoidoscopy or colonoscopy. You may have a sigmoidoscopy every 5 years or a colonoscopy every 10 years starting at age 75. Prostate cancer screening. Recommendations will vary depending on your family history and other risks. Hepatitis C blood test. Hepatitis B blood test. Sexually transmitted disease (STD) testing. Diabetes screening. This is done by checking your blood sugar (glucose) after you have not eaten for a while (fasting). You may have this done every 1-3 years. Abdominal aortic aneurysm (AAA) screening. You may need this if you are a current or former smoker. Osteoporosis. You may be screened starting at age 75 if you are at high risk. Talk with your health care provider about your test results, treatment options, and if necessary, the need for more tests. Vaccines  Your health care provider may recommend certain vaccines, such as: Influenza vaccine. This is recommended every year. Tetanus, diphtheria, and acellular pertussis (Tdap, Td) vaccine. You may need a Td booster every 10 years. Zoster vaccine. You may need this after age 41. Pneumococcal 13-valent conjugate (PCV13) vaccine. One dose is recommended after age 75. Pneumococcal polysaccharide (PPSV23) vaccine. One dose is recommended  after age 75. Talk to your health care provider about which screenings and  vaccines you need and how often you need them. This information is not intended to replace advice given to you by your health care provider. Make sure you discuss any questions you have with your health care provider. Document Released: 09/15/2015 Document Revised: 05/08/2016 Document Reviewed: 06/20/2015 Elsevier Interactive Patient Education  2017 [employer] Prevention in the Home Falls can cause injuries. They can happen to people of all ages. There are many things you can do to make your home safe and to help prevent falls. What can I do on the outside of my home? Regularly fix the edges of walkways and driveways and fix any cracks. Remove anything that might make you trip as you walk through a door, such as a raised step or threshold. Trim any bushes or trees on the path to your home. Use bright outdoor lighting. Clear any walking paths of anything that might make someone trip, such as rocks or tools. Regularly check to see if handrails are loose or broken. Make sure that both sides of any steps have handrails. Any raised decks and porches should have guardrails on the edges. Have any leaves, snow, or ice cleared regularly. Use sand or salt on walking paths during winter. Clean up any spills in your garage right away. This includes oil or grease spills. What can I do in the bathroom? Use night lights. Install grab bars by the toilet and in the tub and shower. Do not use towel bars as grab bars. Use non-skid mats or decals in the tub or shower. If you need to sit down in the shower, use a plastic, non-slip stool. Keep the floor dry. Clean up any water that spills on the floor as soon as it happens. Remove soap buildup in the tub or shower regularly. Attach bath mats securely with double-sided non-slip rug tape. Do not have throw rugs and other things on the floor that can make you trip. What can I do in the bedroom? Use night lights. Make sure that you have a light by your  bed that is easy to reach. Do not use any sheets or blankets that are too big for your bed. They should not hang down onto the floor. Have a firm chair that has side arms. You can use this for support while you get dressed. Do not have throw rugs and other things on the floor that can make you trip. What can I do in the kitchen? Clean up any spills right away. Avoid walking on wet floors. Keep items that you use a lot in easy-to-reach places. If you need to reach something above you, use a strong step stool that has a grab bar. Keep electrical cords out of the way. Do not use floor polish or wax that makes floors slippery. If you must use wax, use non-skid floor wax. Do not have throw rugs and other things on the floor that can make you trip. What can I do with my stairs? Do not leave any items on the stairs. Make sure that there are handrails on both sides of the stairs and use them. Fix handrails that are broken or loose. Make sure that handrails are as long as the stairways. Check any carpeting to make sure that it is firmly attached to the stairs. Fix any carpet that is loose or worn. Avoid having throw rugs at the top or bottom of the stairs. If you  do have throw rugs, attach them to the floor with carpet tape. Make sure that you have a light switch at the top of the stairs and the bottom of the stairs. If you do not have them, ask someone to add them for you. What else can I do to help prevent falls? Wear shoes that: Do not have high heels. Have rubber bottoms. Are comfortable and fit you well. Are closed at the toe. Do not wear sandals. If you use a stepladder: Make sure that it is fully opened. Do not climb a closed stepladder. Make sure that both sides of the stepladder are locked into place. Ask someone to hold it for you, if possible. Clearly mark and make sure that you can see: Any grab bars or handrails. First and last steps. Where the edge of each step is. Use tools that  help you move around (mobility aids) if they are needed. These include: Canes. Walkers. Scooters. Crutches. Turn on the lights when you go into a dark area. Replace any light bulbs as soon as they burn out. Set up your furniture so you have a clear path. Avoid moving your furniture around. If any of your floors are uneven, fix them. If there are any pets around you, be aware of where they are. Review your medicines with your doctor. Some medicines can make you feel dizzy. This can increase your chance of falling. Ask your doctor what other things that you can do to help prevent falls. This information is not intended to replace advice given to you by your health care provider. Make sure you discuss any questions you have with your health care provider. Document Released: 06/15/2009 Document Revised: 01/25/2016 Document Reviewed: 09/23/2014 Elsevier Interactive Patient Education  2017 [employer].

## 2022-03-11 DIAGNOSIS — D485 Neoplasm of uncertain behavior of skin: Secondary | ICD-10-CM | POA: Diagnosis not present

## 2022-03-11 DIAGNOSIS — L814 Other melanin hyperpigmentation: Secondary | ICD-10-CM | POA: Diagnosis not present

## 2022-03-11 DIAGNOSIS — X32XXXS Exposure to sunlight, sequela: Secondary | ICD-10-CM | POA: Diagnosis not present

## 2022-03-11 DIAGNOSIS — D692 Other nonthrombocytopenic purpura: Secondary | ICD-10-CM | POA: Diagnosis not present

## 2022-03-11 DIAGNOSIS — L821 Other seborrheic keratosis: Secondary | ICD-10-CM | POA: Diagnosis not present

## 2022-03-11 DIAGNOSIS — L720 Epidermal cyst: Secondary | ICD-10-CM | POA: Diagnosis not present

## 2022-03-11 DIAGNOSIS — L218 Other seborrheic dermatitis: Secondary | ICD-10-CM | POA: Diagnosis not present

## 2022-03-13 NOTE — Progress Notes (Unsigned)
Subjective:    Patient ID: [name], male    DOB: [birth year], 76 y.o.   MRN: [id number]  No chief complaint on file.   HPI Patient is in today for a follow up.  Past Medical History:  Diagnosis Date   Allergic rhinitis    Atypical chest pain 12/01/2014   Back pain 01/12/2013   COPD (chronic obstructive pulmonary disease) (HCC)    FeV1 64%-2007   Dyspnea    Emphysema    GERD (gastroesophageal reflux disease)    History of lung abscess    bronchiectasis with RMLandRLL ersection -1996- Dr Arlyce Dice   Hordeolum externum (stye) 04/23/2015   Right eye   Hypertension    Impaired vision    glasses   Osteoarthritis    hands and knees   Sinusitis, acute 04/23/2015    Past Surgical History:  Procedure Laterality Date   COLONOSCOPY  11/20/2010   Dr.Stark   ESOPHAGOGASTRODUODENOSCOPY (EGD) WITH PROPOFOL N/A 10/31/2021   Procedure: ESOPHAGOGASTRODUODENOSCOPY (EGD) WITH PROPOFOL;  Surgeon: Gatha Mayer, MD;  Location: [facility];  Service: Gastroenterology;  Laterality: N/A;   HEMOSTASIS CONTROL  10/31/2021   Procedure: HEMOSTASIS CONTROL;  Surgeon: Gatha Mayer, MD;  Location: [facility];  Service: Gastroenterology;;   HERNIA REPAIR  10/03/2009   HOT HEMOSTASIS N/A 10/31/2021   Procedure: HOT HEMOSTASIS (ARGON PLASMA COAGULATION/BICAP);  Surgeon: Gatha Mayer, MD;  Location: [facility];  Service: Gastroenterology;  Laterality: N/A;   left knee     lower back surgery  09/02/2008   06/2009   LUNG SURGERY     RML andRUL removed due to bleeding and bronchiectasis and lung abcess   NECK SURGERY  05/03/2008   right foot surgery     SCLEROTHERAPY  10/31/2021   Procedure: SCLEROTHERAPY;  Surgeon: Gatha Mayer, MD;  Location: [facility] ENDOSCOPY;  Service: Gastroenterology;;   VIDEO BRONCHOSCOPY N/A 07/12/2020   Procedure: VIDEO BRONCHOSCOPY;  Surgeon: [name], MD;  Location[facility] OR;  Service: Thoracic;  Laterality: N/A;   VIDEO BRONCHOSCOPY WITH ENDOBRONCHIAL  NAVIGATION N/A 07/12/2020   Procedure: VIDEO BRONCHOSCOPY WITH ENDOBRONCHIAL NAVIGATION;  Surgeon: [name], MD;  Location: [facility] OR;  Service: Thoracic;  Laterality: N/A;    Family History  Problem Relation Age of Onset   Heart failure Mother        age 54   Prostate cancer Father        father died prostate ca   Obstructive Sleep Apnea Brother    Obesity Brother    Colon cancer Paternal Grandmother    Healthy Son    Coronary artery disease Other        1st degree relative<60   Stroke Other        1st degree relative<50   Esophageal cancer Neg Hx    Stomach cancer Neg Hx     Social History   Socioeconomic History   Marital status: Married    Spouse name: Not on file   Number of children: Not on file   Years of education: Not on file   Highest education level: Not on file  Occupational History   Occupation: retired Music therapist  Tobacco Use   Smoking status: Former    Packs/day: 1.50    Years: 38.00    Total pack years: 57.00    Types: Cigarettes    Quit date: 09/03/2000    Years since quitting: 21.5    Passive exposure: Past   Smokeless tobacco:  Never  Vaping Use   Vaping Use: Never used  Substance and Sexual Activity   Alcohol use: Yes    Alcohol/week: 1.0 standard drink of alcohol    Types: 1 Cans of beer per week    Comment: 3 or 4 nights weekly   Drug use: No   Sexual activity: Never  Other Topics Concern   Not on file  Social History Narrative   Retired Music therapist   Patient states former smoker. 1 1/2 ppd x 38 yrs  Quit in Jan 2002   Married - 2 weeks (4th marriage)   divorced,  remarried 84-2004 (lost wife to lung ca),  remarried (divorced),    1 son  - 2 [name])   Alcohol use-yes (2-3 beers per week)           Social Determinants of Health   Financial Resource Strain: Low Risk  (11/01/2021)   Overall Financial Resource Strain (CARDIA)    Difficulty of Paying Living Expenses: Not very hard  Food Insecurity: No Food  Insecurity (02/28/2021)   Hunger Vital Sign    Worried About Running Out of Food in the Last Year: Never true    Ran Out of Food in the Last Year: Never true  Transportation Needs: No Transportation Needs (02/28/2021)   PRAPARE - Hydrologist (Medical): No    Lack of Transportation (Non-Medical): No  Physical Activity: Sufficiently Active (02/28/2021)   Exercise Vital Sign    Days of Exercise per Week: 6 days    Minutes of Exercise per Session: 30 min  Stress: No Stress Concern Present (02/28/2021)   [place]    Feeling of Stress : Not at all  Social Connections: Moderately Integrated (02/28/2021)   Social Connection and Isolation Panel [NHANES]    Frequency of Communication with Friends and Family: More than three times a week    Frequency of Social Gatherings with Friends and Family: More than three times a week    Attends Religious Services: More than 4 times per year    Active Member of [employer] or Organizations: No    Attends Archivist Meetings: Never    Marital Status: Married  Human resources officer Violence: Not At Risk (02/28/2021)   Humiliation, Afraid, Rape, and Kick questionnaire    Fear of Current or Ex-Partner: No    Emotionally Abused: No    Physically Abused: No    Sexually Abused: No    Outpatient Medications Prior to Visit  Medication Sig Dispense Refill   acetaminophen (TYLENOL) 500 MG tablet Take 2 tablets (1,000 mg total) by mouth every 8 (eight) hours as needed for moderate pain. 100 tablet 0   albuterol (VENTOLIN HFA) 108 (90 Base) MCG/ACT inhaler INHALE 2 PUFFS INTO THE LUNGS EVERY 6 HOURS AS NEEDED FOR WHEEZING OR SHORTNESS OF BREATH. (Patient taking differently: Inhale 2 puffs into the lungs every 6 (six) hours as needed for wheezing or shortness of breath.) 2 each 0   amLODipine (NORVASC) 5 MG tablet Take 1 & 1/2 tablet (7.5 mg total) by mouth daily. 135 tablet 1    benzonatate (TESSALON) 100 MG capsule TAKE 1 CAPSULE BY MOUTH TWICE DAILY AS NEEDED FOR COUGH 40 capsule 1   calcium elemental as carbonate (BARIATRIC TUMS ULTRA) 400 MG chewable tablet Chew 1,000 mg by mouth 2 (two) times daily.      cetirizine (ZYRTEC) 10 MG tablet Take 10 mg by mouth  daily.     ferrous sulfate 325 (65 FE) MG tablet Take 1 tablet (325 mg total) by mouth daily. 30 tablet 0   fluticasone (FLONASE) 50 MCG/ACT nasal spray USE 2 SPRAYS IN EACH NOSTRIL EVERY DAY 48 g 0   Glucosamine-Chondroitin 750-600 MG TABS Take 1 tablet by mouth 2 (two) times daily.     Guaifenesin 1200 MG TB12 Take 600 mg by mouth 2 (two) times daily.     loratadine (CLARITIN) 10 MG tablet Take 10 mg by mouth daily.     Menthol, Topical Analgesic, (BLUE-EMU MAXIMUM STRENGTH EX) Apply 1 application topically daily as needed (arthritis pain).      Multiple Vitamin (MULTIVITAMIN WITH MINERALS) TABS tablet Take 1 tablet by mouth daily.      pantoprazole (PROTONIX) 20 MG tablet Take 1 tablet (20 mg total) by mouth daily. 30 tablet 11   TRELEGY ELLIPTA 100-62.5-25 MCG/ACT AEPB INHALE 1 PUFF INTO THE LUNGS DAILY 180 each 1   vitamin B-12 (CYANOCOBALAMIN) 1000 MCG tablet Take 1 tablet (1,000 mcg total) by mouth daily. 30 tablet 0   No facility-administered medications prior to visit.    Allergies  Allergen Reactions   Losartan Other (See Comments)    weakness   Meloxicam     ROS     Objective:    Physical Exam  There were no vitals taken for this visit. Wt Readings from Last 3 Encounters:  12/11/21 142 lb (64.4 kg)  12/03/21 140 lb (63.5 kg)  11/13/21 139 lb 9.6 oz (63.3 kg)    Diabetic Foot Exam - Simple   No data filed    Lab Results  Component Value Date   WBC 5.8 11/27/2021   HGB 11.2 (L) 11/27/2021   HCT 34.9 (L) 11/27/2021   PLT 355.0 11/27/2021   GLUCOSE 92 11/06/2021   CHOL 150 08/16/2021   TRIG 137.0 08/16/2021   HDL 55.80 08/16/2021   LDLDIRECT 85 07/22/2014   LDLCALC 67  08/16/2021   ALT 17 11/06/2021   AST 18 11/06/2021   NA 137 11/06/2021   K 4.1 11/06/2021   CL 105 11/06/2021   CREATININE 0.63 11/06/2021   BUN 24 (H) 11/06/2021   CO2 25 11/06/2021   TSH 0.57 08/16/2021   PSA 2.22 08/16/2021   INR 1.1 10/30/2021   HGBA1C 5.7 08/16/2021    Lab Results  Component Value Date   TSH 0.57 08/16/2021   Lab Results  Component Value Date   WBC 5.8 11/27/2021   HGB 11.2 (L) 11/27/2021   HCT 34.9 (L) 11/27/2021   MCV 93.4 11/27/2021   PLT 355.0 11/27/2021   Lab Results  Component Value Date   NA 137 11/06/2021   K 4.1 11/06/2021   CO2 25 11/06/2021   GLUCOSE 92 11/06/2021   BUN 24 (H) 11/06/2021   CREATININE 0.63 11/06/2021   BILITOT 0.5 11/06/2021   ALKPHOS 44 11/06/2021   AST 18 11/06/2021   ALT 17 11/06/2021   PROT 5.4 (L) 11/06/2021   ALBUMIN 3.2 (L) 11/06/2021   CALCIUM 8.2 (L) 11/06/2021   ANIONGAP 8 11/03/2021   GFR 93.38 11/06/2021   Lab Results  Component Value Date   CHOL 150 08/16/2021   Lab Results  Component Value Date   HDL 55.80 08/16/2021   Lab Results  Component Value Date   LDLCALC 67 08/16/2021   Lab Results  Component Value Date   TRIG 137.0 08/16/2021   Lab Results  Component Value Date   CHOLHDL  3 08/16/2021   Lab Results  Component Value Date   HGBA1C 5.7 08/16/2021       Assessment & Plan:      Problem List Items Addressed This Visit   None   I am having Boomer S. Mceachin "Al" maintain his loratadine, Guaifenesin, Glucosamine-Chondroitin, calcium elemental as carbonate, multivitamin with minerals, (Menthol, Topical Analgesic, (BLUE-EMU MAXIMUM STRENGTH EX)), cetirizine, albuterol, vitamin B-12, amLODipine, pantoprazole, ferrous sulfate, fluticasone, acetaminophen, benzonatate, and Trelegy Ellipta.  No orders of the defined types were placed in this encounter.

## 2022-03-14 ENCOUNTER — Ambulatory Visit (INDEPENDENT_AMBULATORY_CARE_PROVIDER_SITE_OTHER): Payer: Medicare HMO | Admitting: Family Medicine

## 2022-03-14 ENCOUNTER — Encounter: Payer: Self-pay | Admitting: Family Medicine

## 2022-03-14 ENCOUNTER — Other Ambulatory Visit (HOSPITAL_BASED_OUTPATIENT_CLINIC_OR_DEPARTMENT_OTHER): Payer: Self-pay

## 2022-03-14 VITALS — BP 146/60 | HR 44 | Resp 20 | Ht 67.0 in | Wt 143.2 lb

## 2022-03-14 DIAGNOSIS — I10 Essential (primary) hypertension: Secondary | ICD-10-CM | POA: Diagnosis not present

## 2022-03-14 DIAGNOSIS — D649 Anemia, unspecified: Secondary | ICD-10-CM | POA: Diagnosis not present

## 2022-03-14 DIAGNOSIS — R739 Hyperglycemia, unspecified: Secondary | ICD-10-CM | POA: Diagnosis not present

## 2022-03-14 DIAGNOSIS — M1990 Unspecified osteoarthritis, unspecified site: Secondary | ICD-10-CM

## 2022-03-14 DIAGNOSIS — E785 Hyperlipidemia, unspecified: Secondary | ICD-10-CM

## 2022-03-14 DIAGNOSIS — R001 Bradycardia, unspecified: Secondary | ICD-10-CM | POA: Diagnosis not present

## 2022-03-14 DIAGNOSIS — K922 Gastrointestinal hemorrhage, unspecified: Secondary | ICD-10-CM

## 2022-03-14 DIAGNOSIS — K256 Chronic or unspecified gastric ulcer with both hemorrhage and perforation: Secondary | ICD-10-CM | POA: Diagnosis not present

## 2022-03-14 DIAGNOSIS — D0339 Melanoma in situ of other parts of face: Secondary | ICD-10-CM | POA: Diagnosis not present

## 2022-03-14 DIAGNOSIS — K259 Gastric ulcer, unspecified as acute or chronic, without hemorrhage or perforation: Secondary | ICD-10-CM | POA: Insufficient documentation

## 2022-03-14 LAB — CBC WITH DIFFERENTIAL/PLATELET
Basophils Absolute: 0 10*3/uL (ref 0.0–0.1)
Basophils Relative: 0.4 % (ref 0.0–3.0)
Eosinophils Absolute: 0 10*3/uL (ref 0.0–0.7)
Eosinophils Relative: 0.6 % (ref 0.0–5.0)
HCT: 39.4 % (ref 39.0–52.0)
Hemoglobin: 12.8 g/dL — ABNORMAL LOW (ref 13.0–17.0)
Lymphocytes Relative: 23.1 % (ref 12.0–46.0)
Lymphs Abs: 1.6 10*3/uL (ref 0.7–4.0)
MCHC: 32.4 g/dL (ref 30.0–36.0)
MCV: 89.9 fl (ref 78.0–100.0)
Monocytes Absolute: 0.6 10*3/uL (ref 0.1–1.0)
Monocytes Relative: 8.7 % (ref 3.0–12.0)
Neutro Abs: 4.5 10*3/uL (ref 1.4–7.7)
Neutrophils Relative %: 67.2 % (ref 43.0–77.0)
Platelets: 255 10*3/uL (ref 150.0–400.0)
RBC: 4.38 Mil/uL (ref 4.22–5.81)
RDW: 15.2 % (ref 11.5–15.5)
WBC: 6.7 10*3/uL (ref 4.0–10.5)

## 2022-03-14 LAB — COMPREHENSIVE METABOLIC PANEL
ALT: 14 U/L (ref 0–53)
AST: 22 U/L (ref 0–37)
Albumin: 4.2 g/dL (ref 3.5–5.2)
Alkaline Phosphatase: 77 U/L (ref 39–117)
BUN: 23 mg/dL (ref 6–23)
CO2: 26 mEq/L (ref 19–32)
Calcium: 9.6 mg/dL (ref 8.4–10.5)
Chloride: 103 mEq/L (ref 96–112)
Creatinine, Ser: 0.82 mg/dL (ref 0.40–1.50)
GFR: 86.02 mL/min (ref >60.00)
Glucose, Bld: 87 mg/dL (ref 70–99)
Potassium: 4.5 mEq/L (ref 3.5–5.1)
Sodium: 137 mEq/L (ref 135–145)
Total Bilirubin: 0.9 mg/dL (ref 0.2–1.2)
Total Protein: 7.1 g/dL (ref 6.0–8.3)

## 2022-03-14 LAB — TSH: TSH: 0.81 u[IU]/mL (ref 0.35–5.50)

## 2022-03-14 LAB — VITAMIN D 25 HYDROXY (VIT D DEFICIENCY, FRACTURES): VITD: 55.81 ng/mL (ref 30.00–100.00)

## 2022-03-14 MED ORDER — AMLODIPINE BESYLATE 5 MG PO TABS
5.0000 mg | ORAL_TABLET | Freq: Every day | ORAL | 1 refills | Status: DC
Start: 2022-03-14 — End: 2023-05-13

## 2022-03-14 MED ORDER — TRAMADOL HCL 50 MG PO TABS
50.0000 mg | ORAL_TABLET | Freq: Two times a day (BID) | ORAL | 0 refills | Status: AC | PRN
  Filled 2022-03-14: qty 30, 15d supply, fill #0

## 2022-03-14 NOTE — Assessment & Plan Note (Addendum)
Has been more painful since he had to stop Meloxicam bue to ulcers, He is using tylenol and heat but still does not get adequate relief. He is allowed a prescription for Tramadol to see if it offers relief.

## 2022-03-14 NOTE — Patient Instructions (Addendum)
CBD daily cream, extra strength, menthol  Yerba Matte tea do not drink   Chronic Back Pain When back pain lasts longer than 3 months, it is called chronic back pain. The cause of your back pain may not be known. Some common causes include: Wear and tear (degenerative disease) of the bones, ligaments, or disks in your back. Inflammation and stiffness in your back (arthritis). People who have chronic back pain often go through certain periods in which the pain is more intense (flare-ups). Many people can learn to manage the pain with home care. Follow these instructions at home: Pay attention to any changes in your symptoms. Take these actions to help with your pain: Managing pain and stiffness     If directed, apply ice to the painful area. Your health care provider may recommend applying ice during the first 24-48 hours after a flare-up begins. To do this: Put ice in a plastic bag. Place a towel between your skin and the bag. Leave the ice on for 20 minutes, 2-3 times per day. If directed, apply heat to the affected area as often as told by your health care provider. Use the heat source that your health care provider recommends, such as a moist heat pack or a heating pad. Place a towel between your skin and the heat source. Leave the heat on for 20-30 minutes. Remove the heat if your skin turns bright red. This is especially important if you are unable to feel pain, heat, or cold. You may have a greater risk of getting burned. Try soaking in a warm tub. Activity  Avoid bending and other activities that make the problem worse. Maintain a proper position when standing or sitting: When standing, keep your upper back and neck straight, with your shoulders pulled back. Avoid slouching. When sitting, keep your back straight and relax your shoulders. Do not round your shoulders or pull them backward. Do not sit or stand in one place for long periods of time. Take brief periods of rest  throughout the day. This will reduce your pain. Resting in a lying or standing position is usually better than sitting to rest. When you are resting for longer periods, mix in some mild activity or stretching between periods of rest. This will help to prevent stiffness and pain. Get regular exercise. Ask your health care provider what activities are safe for you. Do not lift anything that is heavier than 10 lb (4.5 kg), or the limit that you are told, until your health care provider says that it is safe. Always use proper lifting technique, which includes: Bending your knees. Keeping the load close to your body. Avoiding twisting. Sleep on a firm mattress in a comfortable position. Try lying on your side with your knees slightly bent. If you lie on your back, put a pillow under your knees. Medicines Treatment may include medicines for pain and inflammation taken by mouth or applied to the skin, prescription pain medicine, or muscle relaxants. Take over-the-counter and prescription medicines only as told by your health care provider. Ask your health care provider if the medicine prescribed to you: Requires you to avoid driving or using machinery. Can cause constipation. You may need to take these actions to prevent or treat constipation: Drink enough fluid to keep your urine pale yellow. Take over-the-counter or prescription medicines. Eat foods that are high in fiber, such as beans, whole grains, and fresh fruits and vegetables. Limit foods that are high in fat and processed sugars, such as  fried or sweet foods. General instructions Do not use any products that contain nicotine or tobacco, such as cigarettes, e-cigarettes, and chewing tobacco. If you need help quitting, ask your health care provider. Keep all follow-up visits as told by your health care provider. This is important. Contact a health care provider if: You have pain that is not relieved with rest or medicine. Your pain gets worse,  or you have new pain. You have a high fever. You have rapid weight loss. You have trouble doing your normal activities. Get help right away if: You have weakness or numbness in one or both of your legs or feet. You have trouble controlling your bladder or your bowels. You have severe back pain and have any of the following: Nausea or vomiting. Pain in your abdomen. Shortness of breath or you faint. Summary Chronic back pain is back pain that lasts longer than 3 months. When a flare-up begins, apply ice to the painful area for the first 24-48 hours. Apply a moist heat pad or use a heating pad on the painful area as directed by your health care provider. When you are resting for longer periods, mix in some mild activity or stretching between periods of rest. This will help to prevent stiffness and pain. This information is not intended to replace advice given to you by your health care provider. Make sure you discuss any questions you have with your health care provider. Document Revised: 09/29/2019 Document Reviewed: 09/29/2019 Elsevier Patient Education  [place].

## 2022-03-14 NOTE — Assessment & Plan Note (Signed)
His anemia and pain have improved since taking him off of meloxicam and he is following up with gastroenterology

## 2022-03-14 NOTE — Assessment & Plan Note (Signed)
hgba1c acceptable, minimize simple carbs. Increase exercise as tolerated.  

## 2022-03-14 NOTE — Assessment & Plan Note (Signed)
With increased fatigue will drop his Amlodipine back down to 5 mg daily and reassess

## 2022-03-14 NOTE — Assessment & Plan Note (Signed)
Well controlled,but pulse is low drop Amlodipine to 5 mg daily and reevaluate. He is endorsing incfreased fatigue with increased . Encouraged heart healthy diet such as the DASH diet and exercise as tolerated.

## 2022-03-15 LAB — IRON,TIBC AND FERRITIN PANEL
%SAT: 23 % (calc) (ref 20–48)
Ferritin: 18 ng/mL — ABNORMAL LOW (ref 24–380)
Iron: 92 ug/dL (ref 50–180)
TIBC: 398 mcg/dL (calc) (ref 250–425)

## 2022-04-04 DIAGNOSIS — D0339 Melanoma in situ of other parts of face: Secondary | ICD-10-CM | POA: Diagnosis not present

## 2022-04-04 DIAGNOSIS — L821 Other seborrheic keratosis: Secondary | ICD-10-CM | POA: Diagnosis not present

## 2022-04-04 DIAGNOSIS — L905 Scar conditions and fibrosis of skin: Secondary | ICD-10-CM | POA: Diagnosis not present

## 2022-04-04 DIAGNOSIS — L989 Disorder of the skin and subcutaneous tissue, unspecified: Secondary | ICD-10-CM | POA: Diagnosis not present

## 2022-04-11 ENCOUNTER — Other Ambulatory Visit: Payer: Self-pay | Admitting: Family Medicine

## 2022-05-06 ENCOUNTER — Other Ambulatory Visit: Payer: Self-pay | Admitting: Family Medicine

## 2022-05-07 ENCOUNTER — Other Ambulatory Visit: Payer: Self-pay

## 2022-05-07 ENCOUNTER — Telehealth: Payer: Self-pay | Admitting: Orthopaedic Surgery

## 2022-05-07 DIAGNOSIS — G8929 Other chronic pain: Secondary | ICD-10-CM

## 2022-05-07 NOTE — Telephone Encounter (Signed)
Made referral to [place]. Tried to call patient. No answer. Voicemail is full.

## 2022-05-07 NOTE — Telephone Encounter (Signed)
Please refer to [facility] for right shoulder replacement.  Thanks.

## 2022-05-07 NOTE — Telephone Encounter (Signed)
Patient said he will eventually need a replacement for both the right knee and right hip, but he would like to start with the right shoulder first.  He was under the impression he would need a shoulder replacement when he was seen and had the cortisone injection.   He is thinking to schedule something sooner rather than later.  Please advise or provide surgery sheet.

## 2022-05-17 ENCOUNTER — Other Ambulatory Visit (HOSPITAL_BASED_OUTPATIENT_CLINIC_OR_DEPARTMENT_OTHER): Payer: Self-pay

## 2022-05-22 DIAGNOSIS — Y998 Other external cause status: Secondary | ICD-10-CM | POA: Diagnosis not present

## 2022-05-22 DIAGNOSIS — W0110XA Fall on same level from slipping, tripping and stumbling with subsequent striking against unspecified object, initial encounter: Secondary | ICD-10-CM | POA: Diagnosis not present

## 2022-05-22 DIAGNOSIS — S0181XA Laceration without foreign body of other part of head, initial encounter: Secondary | ICD-10-CM | POA: Diagnosis not present

## 2022-05-22 DIAGNOSIS — R296 Repeated falls: Secondary | ICD-10-CM | POA: Diagnosis not present

## 2022-05-22 DIAGNOSIS — S0003XA Contusion of scalp, initial encounter: Secondary | ICD-10-CM | POA: Diagnosis not present

## 2022-05-27 ENCOUNTER — Other Ambulatory Visit (HOSPITAL_BASED_OUTPATIENT_CLINIC_OR_DEPARTMENT_OTHER): Payer: Self-pay

## 2022-05-27 ENCOUNTER — Other Ambulatory Visit: Payer: Self-pay | Admitting: Family

## 2022-05-27 MED ORDER — AMLODIPINE BESYLATE 5 MG PO TABS
7.5000 mg | ORAL_TABLET | Freq: Every day | ORAL | 1 refills | Status: DC
  Filled 2022-05-27 – 2022-05-29 (×2): qty 135, 90d supply, fill #0

## 2022-05-29 ENCOUNTER — Ambulatory Visit (INDEPENDENT_AMBULATORY_CARE_PROVIDER_SITE_OTHER): Payer: Medicare HMO | Admitting: Family

## 2022-05-29 ENCOUNTER — Other Ambulatory Visit (HOSPITAL_BASED_OUTPATIENT_CLINIC_OR_DEPARTMENT_OTHER): Payer: Self-pay

## 2022-05-29 VITALS — BP 136/54 | HR 59 | Temp 98.4°F | Resp 18 | Ht 67.0 in | Wt 140.0 lb

## 2022-05-29 DIAGNOSIS — S0191XD Laceration without foreign body of unspecified part of head, subsequent encounter: Secondary | ICD-10-CM

## 2022-05-29 DIAGNOSIS — S0191XA Laceration without foreign body of unspecified part of head, initial encounter: Secondary | ICD-10-CM | POA: Insufficient documentation

## 2022-05-29 DIAGNOSIS — J441 Chronic obstructive pulmonary disease with (acute) exacerbation: Secondary | ICD-10-CM | POA: Diagnosis not present

## 2022-05-29 DIAGNOSIS — J019 Acute sinusitis, unspecified: Secondary | ICD-10-CM | POA: Diagnosis not present

## 2022-05-29 MED ORDER — ALBUTEROL SULFATE HFA 108 (90 BASE) MCG/ACT IN AERS
2.0000 | INHALATION_SPRAY | Freq: Four times a day (QID) | RESPIRATORY_TRACT | 0 refills | Status: DC | PRN
  Filled 2022-05-29: qty 13.4, 60d supply, fill #0

## 2022-05-29 MED ORDER — PREDNISONE 10 MG PO TABS
ORAL_TABLET | ORAL | 0 refills | Status: DC
  Filled 2022-05-29: qty 20, 8d supply, fill #0

## 2022-05-29 MED ORDER — AMOXICILLIN-POT CLAVULANATE 875-125 MG PO TABS
1.0000 | ORAL_TABLET | Freq: Two times a day (BID) | ORAL | 0 refills | Status: DC
  Filled 2022-05-29: qty 20, 10d supply, fill #0

## 2022-05-29 NOTE — Assessment & Plan Note (Signed)
New. Continue albuterol.  Continue Trellegy. Add prednisone taper.

## 2022-05-29 NOTE — Progress Notes (Signed)
Subjective:   By signing my name below, I, [name], attest that this documentation has been prepared under the direction and in the presence of  Karie Chimera, NP 05/29/2022    Patient ID: [name], male    DOB: Jul 24, 1946, 76 y.o.   MRN: [id number]  Chief Complaint  Patient presents with   Wheezing   Suture / Staple Removal    Wheezing  Associated symptoms include coughing and sputum production. Pertinent negatives include no headaches.  Suture / Staple Removal   Patient is in today for office visit.   Suture: He hurt himself near his upper right eye and received stitches at the ED. He denies headache.   Bronchitis/Sinus Infection: He complains of of sore throat and wheezing, which started 10 days ago. He thinks he has bronchitis and sinus infection. He has yellow mucous drainage. He is taking albuterol inhaler to manage his symptoms.   Immunization: He is not interested in receiving the influenza vaccine during today's visit.    Past Medical History:  Diagnosis Date   Allergic rhinitis    Atypical chest pain 12/01/2014   Back pain 01/12/2013   COPD (chronic obstructive pulmonary disease) (HCC)    FeV1 64%-2007   Dyspnea    Emphysema    GERD (gastroesophageal reflux disease)    History of lung abscess    bronchiectasis with RMLandRLL ersection -1996- Dr Arlyce Dice   Hordeolum externum (stye) 04/23/2015   Right eye   Hypertension    Impaired vision    glasses   Osteoarthritis    hands and knees   Sinusitis, acute 04/23/2015    Past Surgical History:  Procedure Laterality Date   COLONOSCOPY  11/20/2010   Dr.Stark   ESOPHAGOGASTRODUODENOSCOPY (EGD) WITH PROPOFOL N/A 10/31/2021   Procedure: ESOPHAGOGASTRODUODENOSCOPY (EGD) WITH PROPOFOL;  Surgeon: Gatha Mayer, MD;  Location: [place];  Service: Gastroenterology;  Laterality: N/A;   HEMOSTASIS CONTROL  10/31/2021   Procedure: HEMOSTASIS CONTROL;  Surgeon: Gatha Mayer, MD;  Location: [place];   Service: Gastroenterology;;   HERNIA REPAIR  10/03/2009   HOT HEMOSTASIS N/A 10/31/2021   Procedure: HOT HEMOSTASIS (ARGON PLASMA COAGULATION/BICAP);  Surgeon: Gatha Mayer, MD;  Location: [place];  Service: Gastroenterology;  Laterality: N/A;   left knee     lower back surgery  09/02/2008   06/2009   LUNG SURGERY     RML andRUL removed due to bleeding and bronchiectasis and lung abcess   NECK SURGERY  05/03/2008   right foot surgery     SCLEROTHERAPY  10/31/2021   Procedure: SCLEROTHERAPY;  Surgeon: Gatha Mayer, MD;  Location: [facility] ENDOSCOPY;  Service: Gastroenterology;;   VIDEO BRONCHOSCOPY N/A 07/12/2020   Procedure: VIDEO BRONCHOSCOPY;  Surgeon: [name], MD;  Location: [facility] OR;  Service: Thoracic;  Laterality: N/A;   VIDEO BRONCHOSCOPY WITH ENDOBRONCHIAL NAVIGATION N/A 07/12/2020   Procedure: VIDEO BRONCHOSCOPY WITH ENDOBRONCHIAL NAVIGATION;  Surgeon: [name], MD;  Location: [facility] OR;  Service: Thoracic;  Laterality: N/A;    Family History  Problem Relation Age of Onset   Heart failure Mother        age 72   Prostate cancer Father        father died prostate ca   Obstructive Sleep Apnea Brother    Obesity Brother    Colon cancer Paternal Grandmother    Healthy Son    Coronary artery disease Other        1st degree relative<60  Stroke Other        1st degree relative<50   Esophageal cancer Neg Hx    Stomach cancer Neg Hx     Social History   Socioeconomic History   Marital status: Married    Spouse name: Not on file   Number of children: Not on file   Years of education: Not on file   Highest education level: Not on file  Occupational History   Occupation: retired Music therapist  Tobacco Use   Smoking status: Former    Packs/day: 1.50    Years: 38.00    Total pack years: 57.00    Types: Cigarettes    Quit date: 09/03/2000    Years since quitting: 21.7    Passive exposure: Past   Smokeless tobacco: Never  Vaping Use    Vaping Use: Never used  Substance and Sexual Activity   Alcohol use: Yes    Alcohol/week: 1.0 standard drink of alcohol    Types: 1 Cans of beer per week    Comment: 3 or 4 nights weekly   Drug use: No   Sexual activity: Never  Other Topics Concern   Not on file  Social History Narrative   Retired Music therapist   Patient states former smoker. 1 1/2 ppd x 38 yrs  Quit in Jan 2002   Married - 2 weeks (4th marriage)   divorced,  remarried 84-2004 (lost wife to lung ca),  remarried (divorced),    1 son  - 68 [name])   Alcohol use-yes (2-3 beers per week)           Social Determinants of Health   Financial Resource Strain: Low Risk  (11/01/2021)   Overall Financial Resource Strain (CARDIA)    Difficulty of Paying Living Expenses: Not very hard  Food Insecurity: No Food Insecurity (02/28/2021)   Hunger Vital Sign    Worried About Running Out of Food in the Last Year: Never true    Ran Out of Food in the Last Year: Never true  Transportation Needs: No Transportation Needs (02/28/2021)   PRAPARE - Hydrologist (Medical): No    Lack of Transportation (Non-Medical): No  Physical Activity: Sufficiently Active (02/28/2021)   Exercise Vital Sign    Days of Exercise per Week: 6 days    Minutes of Exercise per Session: 30 min  Stress: No Stress Concern Present (02/28/2021)   [place]    Feeling of Stress : Not at all  Social Connections: Moderately Integrated (02/28/2021)   Social Connection and Isolation Panel [NHANES]    Frequency of Communication with Friends and Family: More than three times a week    Frequency of Social Gatherings with Friends and Family: More than three times a week    Attends Religious Services: More than 4 times per year    Active Member of [employer] or Organizations: No    Attends Archivist Meetings: Never    Marital Status: Married  Human resources officer  Violence: Not At Risk (02/28/2021)   Humiliation, Afraid, Rape, and Kick questionnaire    Fear of Current or Ex-Partner: No    Emotionally Abused: No    Physically Abused: No    Sexually Abused: No    Outpatient Medications Prior to Visit  Medication Sig Dispense Refill   acetaminophen (TYLENOL) 500 MG tablet Take 2 tablets (1,000 mg total) by mouth every 8 (eight) hours as needed for  moderate pain. 100 tablet 0   amLODipine (NORVASC) 5 MG tablet Take 1 & 1/2 tablet (7.5 mg total) by mouth daily. 135 tablet 1   benzonatate (TESSALON) 100 MG capsule TAKE 1 CAPSULE BY MOUTH TWICE DAILY AS NEEDED FOR COUGH 40 capsule 0   calcium elemental as carbonate (BARIATRIC TUMS ULTRA) 400 MG chewable tablet Chew 1,000 mg by mouth 2 (two) times daily.      cetirizine (ZYRTEC) 10 MG tablet Take 10 mg by mouth daily.     ferrous sulfate 325 (65 FE) MG tablet Take 1 tablet (325 mg total) by mouth daily. 30 tablet 0   fluticasone (FLONASE) 50 MCG/ACT nasal spray USE 2 SPRAYS IN EACH NOSTRIL EVERY DAY 48 g 0   Glucosamine-Chondroitin 750-600 MG TABS Take 1 tablet by mouth 2 (two) times daily.     Guaifenesin 1200 MG TB12 Take 600 mg by mouth 2 (two) times daily.     loratadine (CLARITIN) 10 MG tablet Take 10 mg by mouth daily.     Menthol, Topical Analgesic, (BLUE-EMU MAXIMUM STRENGTH EX) Apply 1 application topically daily as needed (arthritis pain).      Multiple Vitamin (MULTIVITAMIN WITH MINERALS) TABS tablet Take 1 tablet by mouth daily.      pantoprazole (PROTONIX) 20 MG tablet Take 1 tablet (20 mg total) by mouth daily. 30 tablet 11   TRELEGY ELLIPTA 100-62.5-25 MCG/ACT AEPB INHALE 1 PUFF INTO THE LUNGS DAILY 180 each 1   vitamin B-12 (CYANOCOBALAMIN) 1000 MCG tablet Take 1 tablet (1,000 mcg total) by mouth daily. 30 tablet 0   albuterol (VENTOLIN HFA) 108 (90 Base) MCG/ACT inhaler INHALE 2 PUFFS INTO THE LUNGS EVERY 6 HOURS AS NEEDED FOR WHEEZING OR SHORTNESS OF BREATH. (Patient taking differently:  Inhale 2 puffs into the lungs every 6 (six) hours as needed for wheezing or shortness of breath.) 2 each 0   amLODipine (NORVASC) 5 MG tablet Take 1 tablet (5 mg total) by mouth daily. 90 tablet 1   No facility-administered medications prior to visit.    Allergies  Allergen Reactions   Losartan Other (See Comments)    weakness   Meloxicam     Review of Systems  Respiratory:  Positive for cough, sputum production and wheezing.   Neurological:  Negative for headaches.       Objective:    Physical Exam Constitutional:      General: He is not in acute distress.    Appearance: Normal appearance. He is not ill-appearing.  HENT:     Head: Normocephalic and atraumatic.     Right Ear: External ear normal.     Left Ear: External ear normal.     Mouth/Throat:     Pharynx: Posterior oropharyngeal erythema present.  Eyes:     Extraocular Movements: Extraocular movements intact.     Pupils: Pupils are equal, round, and reactive to light.  Cardiovascular:     Rate and Rhythm: Normal rate and regular rhythm.     Heart sounds: Normal heart sounds. No murmur heard.    No gallop.  Pulmonary:     Effort: Pulmonary effort is normal.     Breath sounds: Wheezing (bi-lateral) present.  Skin:    General: Skin is warm and dry.  Neurological:     Mental Status: He is alert and oriented to person, place, and time.  Psychiatric:        Judgment: Judgment normal.     BP (!) 136/54   Pulse (!) 59  Temp 98.4 F (36.9 C)   Resp 18   Ht '5\' 7"'$  (1.702 m)   Wt 140 lb (63.5 kg)   SpO2 96%   BMI 21.93 kg/m  Wt Readings from Last 3 Encounters:  05/29/22 140 lb (63.5 kg)  03/14/22 143 lb 3.2 oz (65 kg)  12/11/21 142 lb (64.4 kg)      Assessment & Plan:   Problem List Items Addressed This Visit       Unprioritized   Laceration of head without foreign body - Primary    3 sutures removed from right temporal area. Pt tolerated procedure well.       COPD exacerbation ([place])    New.  Continue albuterol.  Continue Trellegy. Add prednisone taper.        Relevant Medications   albuterol (VENTOLIN HFA) 108 (90 Base) MCG/ACT inhaler   predniSONE (DELTASONE) 10 MG tablet   Acute sinusitis    New. Rx with Augmentin.       Relevant Medications   amoxicillin-clavulanate (AUGMENTIN) 875-125 MG tablet   predniSONE (DELTASONE) 10 MG tablet     Meds ordered this encounter  Medications   amoxicillin-clavulanate (AUGMENTIN) 875-125 MG tablet    Sig: Take 1 tablet by mouth 2 (two) times daily.    Dispense:  20 tablet    Refill:  0    Order Specific Question:   Supervising Provider    Answer:   Penni Homans A [[id number]]   albuterol (VENTOLIN HFA) 108 (90 Base) MCG/ACT inhaler    Sig: Inhale 2 puffs into the lungs every 6 (six) hours as needed for wheezing or shortness of breath.    Dispense:  13.4 g    Refill:  0    Order Specific Question:   Supervising Provider    Answer:   Penni Homans A [[id number]]   predniSONE (DELTASONE) 10 MG tablet    Sig: Take 4 tablets by mouth daily for 2 days, then 3 tablets daily for 2 days, then 2 tablets daily for 2 days, and then 1 tablet daily for 2 days.    Dispense:  20 tablet    Refill:  0    Order Specific Question:   Supervising Provider    Answer:   Penni Homans A [[id number]]    I, Nance Pear, NP, personally preformed the services described in this documentation.  All medical record entries made by the scribe were at my direction and in my presence.  I have reviewed the chart and discharge instructions (if applicable) and agree that the record reflects my personal performance and is accurate and complete. 05/29/2022  I,Brelyn Woehl S O'Sullivan,acting as a scribe for Nance Pear, NP.,have documented all relevant documentation on the behalf of Nance Pear, NP,as directed by  Nance Pear, NP while in the presence of Nance Pear, NP.    Nance Pear, NP

## 2022-05-29 NOTE — Assessment & Plan Note (Signed)
3 sutures removed from right temporal area. Pt tolerated procedure well.

## 2022-05-29 NOTE — Assessment & Plan Note (Signed)
New. Rx with Augmentin.

## 2022-06-02 ENCOUNTER — Other Ambulatory Visit: Payer: Self-pay | Admitting: Family Medicine

## 2022-06-05 ENCOUNTER — Ambulatory Visit (HOSPITAL_BASED_OUTPATIENT_CLINIC_OR_DEPARTMENT_OTHER): Payer: Medicare HMO | Admitting: Orthopaedic Surgery

## 2022-06-09 ENCOUNTER — Other Ambulatory Visit: Payer: Self-pay | Admitting: Family Medicine

## 2022-06-12 NOTE — Assessment & Plan Note (Signed)
hgba1c acceptable, minimize simple carbs. Increase exercise as tolerated.  

## 2022-06-12 NOTE — Assessment & Plan Note (Signed)
Low dose Ct scan was 10/2021

## 2022-06-12 NOTE — Assessment & Plan Note (Signed)
Encourage heart healthy diet such as MIND or DASH diet, increase exercise, avoid trans fats, simple carbohydrates and processed foods, consider a krill or fish or flaxseed oil cap daily.  °

## 2022-06-12 NOTE — Assessment & Plan Note (Addendum)
Continue to monitor and high protein intake RSV (respiratory syncitial virus) vaccine at pharmacy, Arexvy Covid booster when new version at pharmacy High dose flu shot

## 2022-06-12 NOTE — Assessment & Plan Note (Signed)
Increase leafy greens, consider increased lean red meat and using cast iron cookware. Continue to monitor, report any concerns, improving, continue to monitor

## 2022-06-12 NOTE — Assessment & Plan Note (Signed)
Well controlled, no changes to meds. Encouraged heart healthy diet such as the DASH diet and exercise as tolerated.  °

## 2022-06-13 ENCOUNTER — Ambulatory Visit (INDEPENDENT_AMBULATORY_CARE_PROVIDER_SITE_OTHER): Payer: Medicare HMO | Admitting: Family Medicine

## 2022-06-13 ENCOUNTER — Other Ambulatory Visit (HOSPITAL_BASED_OUTPATIENT_CLINIC_OR_DEPARTMENT_OTHER): Payer: Self-pay

## 2022-06-13 ENCOUNTER — Other Ambulatory Visit (HOSPITAL_COMMUNITY): Payer: Self-pay

## 2022-06-13 VITALS — BP 138/60 | HR 46 | Temp 98.0°F | Resp 16 | Ht 67.0 in | Wt 139.2 lb

## 2022-06-13 DIAGNOSIS — E43 Unspecified severe protein-calorie malnutrition: Secondary | ICD-10-CM | POA: Diagnosis not present

## 2022-06-13 DIAGNOSIS — E785 Hyperlipidemia, unspecified: Secondary | ICD-10-CM

## 2022-06-13 DIAGNOSIS — R739 Hyperglycemia, unspecified: Secondary | ICD-10-CM | POA: Diagnosis not present

## 2022-06-13 DIAGNOSIS — I10 Essential (primary) hypertension: Secondary | ICD-10-CM | POA: Diagnosis not present

## 2022-06-13 DIAGNOSIS — Z23 Encounter for immunization: Secondary | ICD-10-CM | POA: Diagnosis not present

## 2022-06-13 DIAGNOSIS — D649 Anemia, unspecified: Secondary | ICD-10-CM | POA: Diagnosis not present

## 2022-06-13 DIAGNOSIS — J441 Chronic obstructive pulmonary disease with (acute) exacerbation: Secondary | ICD-10-CM | POA: Diagnosis not present

## 2022-06-13 DIAGNOSIS — R911 Solitary pulmonary nodule: Secondary | ICD-10-CM | POA: Diagnosis not present

## 2022-06-13 LAB — COMPREHENSIVE METABOLIC PANEL
ALT: 19 U/L (ref 0–53)
AST: 20 U/L (ref 0–37)
Albumin: 3.6 g/dL (ref 3.5–5.2)
Alkaline Phosphatase: 66 U/L (ref 39–117)
BUN: 20 mg/dL (ref 6–23)
CO2: 28 mEq/L (ref 19–32)
Calcium: 9.1 mg/dL (ref 8.4–10.5)
Chloride: 104 mEq/L (ref 96–112)
Creatinine, Ser: 0.75 mg/dL (ref 0.40–1.50)
GFR: 88.22 mL/min (ref >60.00)
Glucose, Bld: 88 mg/dL (ref 70–99)
Potassium: 4.8 mEq/L (ref 3.5–5.1)
Sodium: 138 mEq/L (ref 135–145)
Total Bilirubin: 0.5 mg/dL (ref 0.2–1.2)
Total Protein: 6.6 g/dL (ref 6.0–8.3)

## 2022-06-13 LAB — CBC
HCT: 39.9 % (ref 39.0–52.0)
Hemoglobin: 13 g/dL (ref 13.0–17.0)
MCHC: 32.6 g/dL (ref 30.0–36.0)
MCV: 90.7 fl (ref 78.0–100.0)
Platelets: 360 10*3/uL (ref 150.0–400.0)
RBC: 4.4 Mil/uL (ref 4.22–5.81)
RDW: 14.5 % (ref 11.5–15.5)
WBC: 6.3 10*3/uL (ref 4.0–10.5)

## 2022-06-13 LAB — TSH: TSH: 0.64 u[IU]/mL (ref 0.35–5.50)

## 2022-06-13 LAB — LIPID PANEL
Cholesterol: 139 mg/dL (ref 0–200)
HDL: 49.1 mg/dL (ref >39.00)
LDL Cholesterol: 52 mg/dL (ref 0–99)
NonHDL: 90.12
Total CHOL/HDL Ratio: 3
Triglycerides: 191 mg/dL — ABNORMAL HIGH (ref 0.0–149.0)
VLDL: 38.2 mg/dL (ref 0.0–40.0)

## 2022-06-13 LAB — HEMOGLOBIN A1C: Hgb A1c MFr Bld: 6.4 % (ref 4.6–6.5)

## 2022-06-13 MED ORDER — DOXYCYCLINE HYCLATE 100 MG PO TABS
100.0000 mg | ORAL_TABLET | Freq: Two times a day (BID) | ORAL | 0 refills | Status: DC
  Filled 2022-06-13: qty 20, 10d supply, fill #0

## 2022-06-13 MED ORDER — COVID-19 MRNA 2023-2024 VACCINE (COMIRNATY) 0.3 ML INJECTION
INTRAMUSCULAR | 0 refills | Status: DC
  Filled 2022-06-13: qty 0.3, 1d supply, fill #0

## 2022-06-13 MED ORDER — METHYLPREDNISOLONE 4 MG PO TABS
ORAL_TABLET | ORAL | 0 refills | Status: AC
  Filled 2022-06-13: qty 15, 5d supply, fill #0

## 2022-06-13 NOTE — Progress Notes (Addendum)
Subjective:   By signing my name below, I, Kellie Simmering, attest that this documentation has been prepared under the direction and in the presence of Mosie Lukes, MD 06/13/2022.     Patient ID: [name], male    DOB: 12/20/1945, 76 y.o.   MRN: [id number]  Chief Complaint  Patient presents with   Follow-up    Here for follow up    HPI Patient is in today for an office visit.  Head Injury: He reports that he has finished his regimen of Amoxicillin 875-125 mg and Prednisone 10 mg to manage associated symptoms from his head injury on 05/22/2022. He states that his condition has improved slightly, but he is still feeling dizzy, light headed, and weak. He denies having fever or chills.  Immunizations: He has been informed about receiving the RSV immunization. He will receive COVID-19 and high-dose Flu immunizations today.   Shortness of Breath: He denies having shortness of breath today but says that he uses an Albuterol inhaler and TRELLEGY ELLIPTA inhaler at home.   Past Medical History:  Diagnosis Date   Allergic rhinitis    Atypical chest pain 12/01/2014   Back pain 01/12/2013   COPD (chronic obstructive pulmonary disease) (HCC)    FeV1 64%-2007   Dyspnea    Emphysema    GERD (gastroesophageal reflux disease)    History of lung abscess    bronchiectasis with RMLandRLL ersection -1996- Dr Arlyce Dice   Hordeolum externum (stye) 04/23/2015   Right eye   Hypertension    Impaired vision    glasses   Osteoarthritis    hands and knees   Sinusitis, acute 04/23/2015   Past Surgical History:  Procedure Laterality Date   COLONOSCOPY  11/20/2010   Dr.Stark   ESOPHAGOGASTRODUODENOSCOPY (EGD) WITH PROPOFOL N/A 10/31/2021   Procedure: ESOPHAGOGASTRODUODENOSCOPY (EGD) WITH PROPOFOL;  Surgeon: Gatha Mayer, MD;  Location: [place];  Service: Gastroenterology;  Laterality: N/A;   HEMOSTASIS CONTROL  10/31/2021   Procedure: HEMOSTASIS CONTROL;  Surgeon: Gatha Mayer, MD;   Location: [facility];  Service: Gastroenterology;;   HERNIA REPAIR  10/03/2009   HOT HEMOSTASIS N/A 10/31/2021   Procedure: HOT HEMOSTASIS (ARGON PLASMA COAGULATION/BICAP);  Surgeon: Gatha Mayer, MD;  Location: [facility];  Service: Gastroenterology;  Laterality: N/A;   left knee     lower back surgery  09/02/2008   06/2009   LUNG SURGERY     RML andRUL removed due to bleeding and bronchiectasis and lung abcess   NECK SURGERY  05/03/2008   right foot surgery     SCLEROTHERAPY  10/31/2021   Procedure: SCLEROTHERAPY;  Surgeon: Gatha Mayer, MD;  Location[facility] ENDOSCOPY;  Service: Gastroenterology;;   VIDEO BRONCHOSCOPY N/A 07/12/2020   Procedure: VIDEO BRONCHOSCOPY;  Surgeon: [name], MD;  Location: [facility] OR;  Service: Thoracic;  Laterality: N/A;   VIDEO BRONCHOSCOPY WITH ENDOBRONCHIAL NAVIGATION N/A 07/12/2020   Procedure: VIDEO BRONCHOSCOPY WITH ENDOBRONCHIAL NAVIGATION;  Surgeon: [name], MD;  Location: [facility] OR;  Service: Thoracic;  Laterality: N/A;   Family History  Problem Relation Age of Onset   Heart failure Mother        age 49   Prostate cancer Father        father died prostate ca   Obstructive Sleep Apnea Brother    Obesity Brother    Colon cancer Paternal Grandmother    Healthy Son    Coronary artery disease Other  1st degree relative<60   Stroke Other        1st degree relative<50   Esophageal cancer Neg Hx    Stomach cancer Neg Hx    Social History   Socioeconomic History   Marital status: Married    Spouse name: Not on file   Number of children: Not on file   Years of education: Not on file   Highest education level: Not on file  Occupational History   Occupation: retired Music therapist  Tobacco Use   Smoking status: Former    Packs/day: 1.50    Years: 38.00    Total pack years: 57.00    Types: Cigarettes    Quit date: 09/03/2000    Years since quitting: 21.7    Passive exposure: Past   Smokeless tobacco: Never   Vaping Use   Vaping Use: Never used  Substance and Sexual Activity   Alcohol use: Yes    Alcohol/week: 1.0 standard drink of alcohol    Types: 1 Cans of beer per week    Comment: 3 or 4 nights weekly   Drug use: No   Sexual activity: Never  Other Topics Concern   Not on file  Social History Narrative   Retired Music therapist   Patient states former smoker. 1 1/2 ppd x 38 yrs  Quit in Jan 2002   Married - 2 weeks (4th marriage)   divorced,  remarried 84-2004 (lost wife to lung ca),  remarried (divorced),    1 son  - 8 [name])   Alcohol use-yes (2-3 beers per week)           Social Determinants of Health   Financial Resource Strain: Low Risk  (11/01/2021)   Overall Financial Resource Strain (CARDIA)    Difficulty of Paying Living Expenses: Not very hard  Food Insecurity: No Food Insecurity (02/28/2021)   Hunger Vital Sign    Worried About Running Out of Food in the Last Year: Never true    Ran Out of Food in the Last Year: Never true  Transportation Needs: No Transportation Needs (02/28/2021)   PRAPARE - Hydrologist (Medical): No    Lack of Transportation (Non-Medical): No  Physical Activity: Sufficiently Active (02/28/2021)   Exercise Vital Sign    Days of Exercise per Week: 6 days    Minutes of Exercise per Session: 30 min  Stress: No Stress Concern Present (02/28/2021)   [place]    Feeling of Stress : Not at all  Social Connections: Moderately Integrated (02/28/2021)   Social Connection and Isolation Panel [NHANES]    Frequency of Communication with Friends and Family: More than three times a week    Frequency of Social Gatherings with Friends and Family: More than three times a week    Attends Religious Services: More than 4 times per year    Active Member of [employer] or Organizations: No    Attends Archivist Meetings: Never    Marital Status: Married   Human resources officer Violence: Not At Risk (02/28/2021)   Humiliation, Afraid, Rape, and Kick questionnaire    Fear of Current or Ex-Partner: No    Emotionally Abused: No    Physically Abused: No    Sexually Abused: No   Outpatient Medications Prior to Visit  Medication Sig Dispense Refill   acetaminophen (TYLENOL) 500 MG tablet Take 2 tablets (1,000 mg total) by mouth every 8 (eight) hours  as needed for moderate pain. 100 tablet 0   albuterol (VENTOLIN HFA) 108 (90 Base) MCG/ACT inhaler Inhale 2 puffs into the lungs every 6 (six) hours as needed for wheezing or shortness of breath. 13.4 g 0   amLODipine (NORVASC) 5 MG tablet Take 1 & 1/2 tablet (7.5 mg total) by mouth daily. 135 tablet 1   benzonatate (TESSALON) 100 MG capsule TAKE 1 CAPSULE BY MOUTH TWICE DAILY AS NEEDED FOR COUGH 40 capsule 0   calcium elemental as carbonate (BARIATRIC TUMS ULTRA) 400 MG chewable tablet Chew 1,000 mg by mouth 2 (two) times daily.      cetirizine (ZYRTEC) 10 MG tablet Take 10 mg by mouth daily.     ferrous sulfate 325 (65 FE) MG tablet Take 1 tablet (325 mg total) by mouth daily. 30 tablet 0   fluticasone (FLONASE) 50 MCG/ACT nasal spray USE 2 SPRAYS IN EACH NOSTRIL EVERY DAY 48 g 0   Glucosamine-Chondroitin 750-600 MG TABS Take 1 tablet by mouth 2 (two) times daily.     Guaifenesin 1200 MG TB12 Take 600 mg by mouth 2 (two) times daily.     loratadine (CLARITIN) 10 MG tablet Take 10 mg by mouth daily.     Menthol, Topical Analgesic, (BLUE-EMU MAXIMUM STRENGTH EX) Apply 1 application topically daily as needed (arthritis pain).      Multiple Vitamin (MULTIVITAMIN WITH MINERALS) TABS tablet Take 1 tablet by mouth daily.      pantoprazole (PROTONIX) 20 MG tablet Take 1 tablet (20 mg total) by mouth daily. 30 tablet 11   TRELEGY ELLIPTA 100-62.5-25 MCG/ACT AEPB INHALE 1 PUFF INTO THE LUNGS DAILY 180 each 1   vitamin B-12 (CYANOCOBALAMIN) 1000 MCG tablet Take 1 tablet (1,000 mcg total) by mouth daily. 30 tablet 0    amLODipine (NORVASC) 5 MG tablet Take 1 tablet (5 mg total) by mouth daily. 90 tablet 1   amoxicillin-clavulanate (AUGMENTIN) 875-125 MG tablet Take 1 tablet by mouth 2 (two) times daily. 20 tablet 0   predniSONE (DELTASONE) 10 MG tablet Take 4 tablets by mouth daily for 2 days, then 3 tablets daily for 2 days, then 2 tablets daily for 2 days, and then 1 tablet daily for 2 days. 20 tablet 0   No facility-administered medications prior to visit.   Allergies  Allergen Reactions   Losartan Other (See Comments)    weakness   Meloxicam    Review of Systems  Constitutional:  Negative for chills and fever.  Respiratory: Negative.  Negative for shortness of breath.   Cardiovascular:  Negative for chest pain.  Neurological:  Positive for dizziness and weakness.       (+) Light-headedness.      Objective:    Physical Exam Constitutional:      General: He is not in acute distress.    Appearance: Normal appearance. He is not ill-appearing.  HENT:     Head: Normocephalic and atraumatic.     Right Ear: External ear normal.     Left Ear: External ear normal.     Mouth/Throat:     Mouth: Mucous membranes are moist.     Pharynx: Oropharynx is clear. Posterior oropharyngeal erythema present.  Eyes:     Extraocular Movements: Extraocular movements intact.     Pupils: Pupils are equal, round, and reactive to light.  Cardiovascular:     Rate and Rhythm: Normal rate. Rhythm irregular.     Pulses: Normal pulses.     Heart sounds: Normal heart sounds.  No murmur heard.    No gallop.  Pulmonary:     Effort: Pulmonary effort is normal. No respiratory distress.     Breath sounds: Normal breath sounds. No wheezing or rales.  Abdominal:     General: Bowel sounds are normal.  Skin:    General: Skin is warm and dry.  Neurological:     Mental Status: He is alert and oriented to person, place, and time.  Psychiatric:        Mood and Affect: Mood normal.        Behavior: Behavior normal.         Judgment: Judgment normal.    BP 138/60 (BP Location: Right Arm, Patient Position: Sitting, Cuff Size: Normal)   Pulse (!) 46   Temp 98 F (36.7 C) (Oral)   Resp 16   Ht '5\' 7"'$  (1.702 m)   Wt 139 lb 3.2 oz (63.1 kg)   SpO2 97%   BMI 21.80 kg/m  Wt Readings from Last 3 Encounters:  06/13/22 139 lb 3.2 oz (63.1 kg)  05/29/22 140 lb (63.5 kg)  03/14/22 143 lb 3.2 oz (65 kg)   Diabetic Foot Exam - Simple   No data filed    Lab Results  Component Value Date   WBC 6.3 06/13/2022   HGB 13.0 06/13/2022   HCT 39.9 06/13/2022   PLT 360.0 06/13/2022   GLUCOSE 88 06/13/2022   CHOL 139 06/13/2022   TRIG 191.0 (H) 06/13/2022   HDL 49.10 06/13/2022   LDLDIRECT 85 07/22/2014   LDLCALC 52 06/13/2022   ALT 19 06/13/2022   AST 20 06/13/2022   NA 138 06/13/2022   K 4.8 06/13/2022   CL 104 06/13/2022   CREATININE 0.75 06/13/2022   BUN 20 06/13/2022   CO2 28 06/13/2022   TSH 0.64 06/13/2022   PSA 2.22 08/16/2021   INR 1.1 10/30/2021   HGBA1C 6.4 06/13/2022   Lab Results  Component Value Date   TSH 0.64 06/13/2022   Lab Results  Component Value Date   WBC 6.3 06/13/2022   HGB 13.0 06/13/2022   HCT 39.9 06/13/2022   MCV 90.7 06/13/2022   PLT 360.0 06/13/2022   Lab Results  Component Value Date   NA 138 06/13/2022   K 4.8 06/13/2022   CO2 28 06/13/2022   GLUCOSE 88 06/13/2022   BUN 20 06/13/2022   CREATININE 0.75 06/13/2022   BILITOT 0.5 06/13/2022   ALKPHOS 66 06/13/2022   AST 20 06/13/2022   ALT 19 06/13/2022   PROT 6.6 06/13/2022   ALBUMIN 3.6 06/13/2022   CALCIUM 9.1 06/13/2022   ANIONGAP 8 11/03/2021   GFR 88.22 06/13/2022   Lab Results  Component Value Date   CHOL 139 06/13/2022   Lab Results  Component Value Date   HDL 49.10 06/13/2022   Lab Results  Component Value Date   LDLCALC 52 06/13/2022   Lab Results  Component Value Date   TRIG 191.0 (H) 06/13/2022   Lab Results  Component Value Date   CHOLHDL 3 06/13/2022   Lab Results   Component Value Date   HGBA1C 6.4 06/13/2022      Assessment & Plan:   Problem List Items Addressed This Visit     Benign essential HTN    Well controlled, no changes to meds. Encouraged heart healthy diet such as the DASH diet and exercise as tolerated.       Relevant Orders   CBC (Completed)   Comprehensive metabolic panel (Completed)  TSH (Completed)   COPD exacerbation ([place])    Has just finished a course of antibiotics and steroids. While on them he was improving some but now his symptoms are some worse again, notes cough, congestion, weakness and fatigue. Given a course of Doxycyline and a medrol dosepak to use if continues to worsen. Add a probiotic and Encouraged increased rest and hydration, add probiotics, zinc such as Coldeze or Xicam. Treat fevers as needed      Relevant Medications   methylPREDNISolone (MEDROL) 4 MG tablet   Anemia    Increase leafy greens, consider increased lean red meat and using cast iron cookware. Continue to monitor, report any concerns, improving, continue to monitor      Hyperglycemia    hgba1c acceptable, minimize simple carbs. Increase exercise as tolerated.       Relevant Orders   Hemoglobin A1c (Completed)   Solitary pulmonary nodule on lung CT    Low dose Ct scan was 10/2021      Hyperlipidemia    Encourage heart healthy diet such as MIND or DASH diet, increase exercise, avoid trans fats, simple carbohydrates and processed foods, consider a krill or fish or flaxseed oil cap daily.       Relevant Orders   Lipid panel (Completed)   Protein-calorie malnutrition, severe    Continue to monitor and high protein intake RSV (respiratory syncitial virus) vaccine at pharmacy, Arexvy Covid booster when new version at pharmacy High dose flu shot       Other Visit Diagnoses     Need for influenza vaccination    -  Primary   Relevant Orders   Flu Vaccine QUAD High Dose(Fluad) (Completed)      Meds ordered this encounter   Medications   doxycycline (VIBRA-TABS) 100 MG tablet    Sig: Take 1 tablet (100 mg total) by mouth 2 (two) times daily.    Dispense:  20 tablet    Refill:  0   methylPREDNISolone (MEDROL) 4 MG tablet    Sig: 5 tablets (20 mg total) daily for 1 day, THEN 4 tablets (16 mg total) daily for 1 day, THEN 3 tablets (12 mg total) daily for 1 day, THEN 2 tablets (8 mg total) daily for 1 day, THEN 1 tablet (4 mg total) daily for 1 day, then stop    Dispense:  15 tablet    Refill:  0   I, Penni Homans, MD, personally preformed the services described in this documentation.  All medical record entries made by the scribe were at my direction and in my presence.  I have reviewed the chart and discharge instructions (if applicable) and agree that the record reflects my personal performance and is accurate and complete. 06/13/2022  I,Mohammed Iqbal,acting as a scribe for Penni Homans, MD.,have documented all relevant documentation on the behalf of Penni Homans, MD,as directed by  Penni Homans, MD while in the presence of Penni Homans, MD.  Penni Homans, MD

## 2022-06-13 NOTE — Patient Instructions (Addendum)
Carmi is [place] based and reasonably priced with independent studies  Curcumen or Turmeric  RSV (respiratory syncitial virus) vaccine at pharmacy, Arexvy in 2 weeks Covid booster when new version out late September At pharmacy today High dose flu shot today  Emergen C packets one to two times a day when struggling with respiratory symptoms  Take a probiotic for roughly a month when anyone gives you an antibiotic

## 2022-06-13 NOTE — Assessment & Plan Note (Addendum)
Has just finished a course of antibiotics and steroids. While on them he was improving some but now his symptoms are some worse again, notes cough, congestion, weakness and fatigue. Given a course of Doxycyline and a medrol dosepak to use if continues to worsen. Add a probiotic and Encouraged increased rest and hydration, add probiotics, zinc such as Coldeze or Xicam. Treat fevers as needed

## 2022-06-23 ENCOUNTER — Other Ambulatory Visit: Payer: Self-pay | Admitting: Family Medicine

## 2022-06-30 ENCOUNTER — Encounter: Payer: Self-pay | Admitting: Family Medicine

## 2022-07-01 ENCOUNTER — Other Ambulatory Visit: Payer: Self-pay | Admitting: Family Medicine

## 2022-07-01 MED ORDER — BENZONATATE 100 MG PO CAPS: ORAL_CAPSULE | ORAL | 0 refills | Status: DC

## 2022-07-01 MED ORDER — AMOXICILLIN 500 MG PO CAPS: 500.0000 mg | ORAL_CAPSULE | Freq: Three times a day (TID) | ORAL | 0 refills | Status: AC

## 2022-07-03 ENCOUNTER — Telehealth: Payer: Self-pay | Admitting: Orthopaedic Surgery

## 2022-07-03 ENCOUNTER — Ambulatory Visit (HOSPITAL_BASED_OUTPATIENT_CLINIC_OR_DEPARTMENT_OTHER): Payer: Medicare HMO | Admitting: Orthopaedic Surgery

## 2022-07-03 ENCOUNTER — Ambulatory Visit (INDEPENDENT_AMBULATORY_CARE_PROVIDER_SITE_OTHER): Payer: Medicare HMO | Admitting: Orthopaedic Surgery

## 2022-07-03 DIAGNOSIS — M12811 Other specific arthropathies, not elsewhere classified, right shoulder: Secondary | ICD-10-CM | POA: Diagnosis not present

## 2022-07-03 MED ORDER — ACETAMINOPHEN 500 MG PO TABS: 500.0000 mg | ORAL_TABLET | Freq: Three times a day (TID) | ORAL | 0 refills | Status: AC

## 2022-07-03 MED ORDER — ENOXAPARIN SODIUM 40 MG/0.4ML IJ SOSY: 40.0000 mg | PREFILLED_SYRINGE | INTRAMUSCULAR | 0 refills | Status: DC

## 2022-07-03 MED ORDER — OXYCODONE HCL 5 MG PO CAPS: 5.0000 mg | ORAL_CAPSULE | ORAL | 0 refills | Status: DC | PRN

## 2022-07-03 NOTE — Progress Notes (Signed)
Chief Complaint: Follow-up for right shoulder     History of Present Illness:    [name] is a 76 y.o. male presents today as a referral from my partner Dr. Erlinda Hong for discussion of his right shoulder pain.  He has had this now for several years.  He is quite limited with any type of overhead activity.  He has pain in the shoulder with most activities including fishing which he enjoys doing.  He does have a history of gastritis for which she is unable to take anti-inflammatories.  He did have an ultrasound-guided injection previously with very limited relief.  He states today seeking further additional treatment.    Surgical History:   None  PMH/PSH/Family History/Social History/Meds/Allergies:    Past Medical History:  Diagnosis Date   Allergic rhinitis    Atypical chest pain 12/01/2014   Back pain 01/12/2013   COPD (chronic obstructive pulmonary disease) (HCC)    FeV1 64%-2007   Dyspnea    Emphysema    GERD (gastroesophageal reflux disease)    History of lung abscess    bronchiectasis with RMLandRLL ersection -1996- Dr Arlyce Dice   Hordeolum externum (stye) 04/23/2015   Right eye   Hypertension    Impaired vision    glasses   Osteoarthritis    hands and knees   Sinusitis, acute 04/23/2015   Past Surgical History:  Procedure Laterality Date   COLONOSCOPY  11/20/2010   Dr.Stark   ESOPHAGOGASTRODUODENOSCOPY (EGD) WITH PROPOFOL N/A 10/31/2021   Procedure: ESOPHAGOGASTRODUODENOSCOPY (EGD) WITH PROPOFOL;  Surgeon: Gatha Mayer, MD;  Location: [place];  Service: Gastroenterology;  Laterality: N/A;   HEMOSTASIS CONTROL  10/31/2021   Procedure: HEMOSTASIS CONTROL;  Surgeon: Gatha Mayer, MD;  Location: [place];  Service: Gastroenterology;;   HERNIA REPAIR  10/03/2009   HOT HEMOSTASIS N/A 10/31/2021   Procedure: HOT HEMOSTASIS (ARGON PLASMA COAGULATION/BICAP);  Surgeon: Gatha Mayer, MD;  Location: [place];  Service:  Gastroenterology;  Laterality: N/A;   left knee     lower back surgery  09/02/2008   06/2009   LUNG SURGERY     RML andRUL removed due to bleeding and bronchiectasis and lung abcess   NECK SURGERY  05/03/2008   right foot surgery     SCLEROTHERAPY  10/31/2021   Procedure: SCLEROTHERAPY;  Surgeon: Gatha Mayer, MD;  Location: [facility] END[facility]COPY;  Service: Gastroenterology;;   VIDEO BRONCHOSCOPY N/A 07/12/2020   Procedure: VIDEO BRONCHOSCOPY;  Surgeon: [name], MD;  Location: [facility] OR;  Service: Thoracic;  Laterality: N/A;   VIDEO BRONCHOSCOPY WITH ENDOBRONCHIAL NAVIGATION N/A 07/12/2020   Procedure: VIDEO BRONCHOSCOPY WITH ENDOBRONCHIAL NAVIGATION;  Surgeon: [name], MD;  Location: [facility] OR;  Service: Thoracic;  Laterality: N/A;   Social History   Socioeconomic History   Marital status: Married    Spouse name: Not on file   Number of children: Not on file   Years of education: Not on file   Highest education level: Not on file  Occupational History   Occupation: retired Music therapist  Tobacco Use   Smoking status: Former    Packs/day: 1.50    Years: 38.00    Total pack years: 57.00    Types: Cigarettes    Quit date: 09/03/2000    Years since quitting: 21.8  Passive exposure: Past   Smokeless tobacco: Never  Vaping Use   Vaping Use: Never used  Substance and Sexual Activity   Alcohol use: Yes    Alcohol/week: 1.0 standard drink of alcohol    Types: 1 Cans of beer per week    Comment: 3 or 4 nights weekly   Drug use: No   Sexual activity: Never  Other Topics Concern   Not on file  Social History Narrative   Retired Music therapist   Patient states former smoker. 1 1/2 ppd x 38 yrs  Quit in Jan 2002   Married - 2 weeks (4th marriage)   divorced,  remarried 84-2004 (lost wife to lung ca),  remarried (divorced),    1 son  - 11 [name])   Alcohol use-yes (2-3 beers per week)           Social Determinants of Health   Financial Resource  Strain: Low Risk  (11/01/2021)   Overall Financial Resource Strain (CARDIA)    Difficulty of Paying Living Expenses: Not very hard  Food Insecurity: No Food Insecurity (02/28/2021)   Hunger Vital Sign    Worried About Running Out of Food in the Last Year: Never true    Ran Out of Food in the Last Year: Never true  Transportation Needs: No Transportation Needs (02/28/2021)   PRAPARE - Hydrologist (Medical): No    Lack of Transportation (Non-Medical): No  Physical Activity: Sufficiently Active (02/28/2021)   Exercise Vital Sign    Days of Exercise per Week: 6 days    Minutes of Exercise per Session: 30 min  Stress: No Stress Concern Present (02/28/2021)   [place]    Feeling of Stress : Not at all  Social Connections: Moderately Integrated (02/28/2021)   Social Connection and Isolation Panel [NHANES]    Frequency of Communication with Friends and Family: More than three times a week    Frequency of Social Gatherings with Friends and Family: More than three times a week    Attends Religious Services: More than 4 times per year    Active Member of [employer] or Organizations: No    Attends Music therapist: Never    Marital Status: Married   Family History  Problem Relation Age of Onset   Heart failure Mother        age 63   Prostate cancer Father        father died prostate ca   Obstructive Sleep Apnea Brother    Obesity Brother    Colon cancer Paternal Grandmother    Healthy Son    Coronary artery disease Other        1st degree relative<60   Stroke Other        1st degree relative<50   Esophageal cancer Neg Hx    Stomach cancer Neg Hx    Allergies  Allergen Reactions   Losartan Other (See Comments)    weakness   Meloxicam    Current Outpatient Medications  Medication Sig Dispense Refill   acetaminophen (TYLENOL) 500 MG tablet Take 1 tablet (500 mg total) by mouth every  8 (eight) hours for 10 days. 30 tablet 0   enoxaparin (LOVENOX) 40 MG/0.4ML injection Inject 0.4 mLs (40 mg total) into the skin daily for 14 days. 5.6 mL 0   oxycodone (OXY-IR) 5 MG capsule Take 1 capsule (5 mg total) by mouth every 4 (four) hours as  needed (severe pain). 20 capsule 0   acetaminophen (TYLENOL) 500 MG tablet Take 2 tablets (1,000 mg total) by mouth every 8 (eight) hours as needed for moderate pain. 100 tablet 0   albuterol (VENTOLIN HFA) 108 (90 Base) MCG/ACT inhaler INHALE 2 PUFFS INTO THE LUNGS EVERY 6 HOURS AS NEEDED FOR WHEEZING OR SHORTNESS OF BREATH. 2 each 10   amLODipine (NORVASC) 5 MG tablet Take 1 tablet (5 mg total) by mouth daily. 90 tablet 1   amLODipine (NORVASC) 5 MG tablet Take 1 & 1/2 tablet (7.5 mg total) by mouth daily. 135 tablet 1   amoxicillin (AMOXIL) 500 MG capsule Take 1 capsule (500 mg total) by mouth 3 (three) times daily for 10 days. 30 capsule 0   benzonatate (TESSALON) 100 MG capsule TAKE 1 CAPSULE BY MOUTH TWICE DAILY AS NEEDED FOR COUGH 40 capsule 0   calcium elemental as carbonate (BARIATRIC TUMS ULTRA) 400 MG chewable tablet Chew 1,000 mg by mouth 2 (two) times daily.      cetirizine (ZYRTEC) 10 MG tablet Take 10 mg by mouth daily.     COVID-19 mRNA vaccine 2023-2024 (COMIRNATY) SUSP injection Inject into the muscle. 0.3 mL 0   ferrous sulfate 325 (65 FE) MG tablet Take 1 tablet (325 mg total) by mouth daily. 30 tablet 0   fluticasone (FLONASE) 50 MCG/ACT nasal spray USE 2 SPRAYS IN EACH NOSTRIL EVERY DAY 48 g 10   Glucosamine-Chondroitin 750-600 MG TABS Take 1 tablet by mouth 2 (two) times daily.     Guaifenesin 1200 MG TB12 Take 600 mg by mouth 2 (two) times daily.     loratadine (CLARITIN) 10 MG tablet Take 10 mg by mouth daily.     Menthol, Topical Analgesic, (BLUE-EMU MAXIMUM STRENGTH EX) Apply 1 application topically daily as needed (arthritis pain).      Multiple Vitamin (MULTIVITAMIN WITH MINERALS) TABS tablet Take 1 tablet by mouth daily.       pantoprazole (PROTONIX) 20 MG tablet Take 1 tablet (20 mg total) by mouth daily. 30 tablet 11   TRELEGY ELLIPTA 100-62.5-25 MCG/ACT AEPB INHALE 1 PUFF INTO THE LUNGS DAILY 180 each 1   vitamin B-12 (CYANOCOBALAMIN) 1000 MCG tablet Take 1 tablet (1,000 mcg total) by mouth daily. 30 tablet 0   No current facility-administered medications for this visit.   No results found.  Review of Systems:   A ROS was performed including pertinent positives and negatives as documented in the HPI.  Physical Exam :   Constitutional: NAD and appears stated age Neurological: Alert and oriented Psych: Appropriate affect and cooperative There were no vitals taken for this visit.   Comprehensive Musculoskeletal Exam:    Musculoskeletal Exam    Inspection Right Left  Skin No atrophy or winging No atrophy or winging  Palpation    Tenderness Glenohumeral, acromion None  Range of Motion    Flexion (passive) 90 170  Flexion (active) 90 170  Abduction 80 170  ER at the side 20 70  Can reach behind back to Back pocket L5  Strength     4-5 Full  Special Tests    Pseudoparalytic No No  Neurologic    Fires PIN, radial, median, ulnar, musculocutaneous, axillary, suprascapular, long thoracic, and spinal accessory innervated muscles. No abnormal sensibility  Vascular/Lymphatic    Radial Pulse 2+ 2+  Cervical Exam    Patient has symmetric cervical range of motion with negative Spurling's test.  Special Test:      Imaging:  Xray (3 views right shoulder): Severe glenohumeral osteoarthritis as well as rotator cuff arthropathy with acetabular rotation of the acromion    I personally reviewed and interpreted the radiographs.   Assessment:   76 y.o. male left-hand-dominant male with severe right shoulder rotator cuff arthropathy which is limiting him with most overhead activities.  Given the fact that he has failed now an injection as well as activity modification and is having pain most of the time  I do believe he would be a candidate for reverse shoulder arthroplasty.  I discussed with him the specific risks and benefits associated with this.  At this point I do not believe that physical therapy would significantly improve his pain as he does have acetabularizaition of the acromion which I do believe is likely a large indicator of his pain.  He is not able to take NSAIDs as a result of a severe gastritis requiring intervention and as result we will plan to put him on Lovenox for 2 weeks postop  Plan :    -Plan for right shoulder reverse shoulder arthroplasty   After a lengthy discussion of treatment options, including risks, benefits, alternatives, complications of surgical and nonsurgical conservative options, the patient elected surgical repair.   The patient  is aware of the material risks  and complications including, but not limited to injury to adjacent structures, neurovascular injury, infection, numbness, bleeding, implant failure, thermal burns, stiffness, persistent pain, failure to heal, disease transmission from allograft, need for further surgery, dislocation, anesthetic risks, blood clots, risks of death,and others. The probabilities of surgical success and failure discussed with patient given their particular co-morbidities.The time and nature of expected rehabilitation and recovery was discussed.The patient's questions were all answered preoperatively.  No barriers to understanding were noted. I explained the natural history of the disease process and Rx rationale.  I explained to the patient what I considered to be reasonable expectations given their personal situation.  The final treatment plan was arrived at through a shared patient decision making process model.       I personally saw and evaluated the patient, and participated in the management and treatment plan.  Vanetta Mulders, MD Attending Physician, Orthopedic Surgery  This document was dictated using Dragon  voice recognition software. A reasonable attempt at proof reading has been made to minimize errors.

## 2022-07-09 ENCOUNTER — Ambulatory Visit (HOSPITAL_BASED_OUTPATIENT_CLINIC_OR_DEPARTMENT_OTHER)
Admission: RE | Admit: 2022-07-09 | Discharge: 2022-07-09 | Disposition: A | Discharge status: Home / Self Care | Payer: Medicare HMO | Source: Ambulatory Visit | Attending: Orthopaedic Surgery | Admitting: Orthopaedic Surgery

## 2022-07-09 DIAGNOSIS — M12811 Other specific arthropathies, not elsewhere classified, right shoulder: Secondary | ICD-10-CM | POA: Insufficient documentation

## 2022-07-09 DIAGNOSIS — M25511 Pain in right shoulder: Secondary | ICD-10-CM | POA: Diagnosis not present

## 2022-07-09 DIAGNOSIS — Z01818 Encounter for other preprocedural examination: Secondary | ICD-10-CM | POA: Diagnosis not present

## 2022-07-17 DIAGNOSIS — L814 Other melanin hyperpigmentation: Secondary | ICD-10-CM | POA: Diagnosis not present

## 2022-07-17 DIAGNOSIS — D1801 Hemangioma of skin and subcutaneous tissue: Secondary | ICD-10-CM | POA: Diagnosis not present

## 2022-07-17 DIAGNOSIS — L820 Inflamed seborrheic keratosis: Secondary | ICD-10-CM | POA: Diagnosis not present

## 2022-07-17 DIAGNOSIS — D485 Neoplasm of uncertain behavior of skin: Secondary | ICD-10-CM | POA: Diagnosis not present

## 2022-07-17 DIAGNOSIS — Z85820 Personal history of malignant melanoma of skin: Secondary | ICD-10-CM | POA: Diagnosis not present

## 2022-07-17 DIAGNOSIS — L578 Other skin changes due to chronic exposure to nonionizing radiation: Secondary | ICD-10-CM | POA: Diagnosis not present

## 2022-07-17 DIAGNOSIS — X32XXXS Exposure to sunlight, sequela: Secondary | ICD-10-CM | POA: Diagnosis not present

## 2022-07-17 DIAGNOSIS — L570 Actinic keratosis: Secondary | ICD-10-CM | POA: Diagnosis not present

## 2022-07-17 DIAGNOSIS — C44519 Basal cell carcinoma of skin of other part of trunk: Secondary | ICD-10-CM | POA: Diagnosis not present

## 2022-07-18 ENCOUNTER — Ambulatory Visit (HOSPITAL_BASED_OUTPATIENT_CLINIC_OR_DEPARTMENT_OTHER): Payer: Self-pay | Admitting: Orthopaedic Surgery

## 2022-07-18 DIAGNOSIS — M12811 Other specific arthropathies, not elsewhere classified, right shoulder: Secondary | ICD-10-CM

## 2022-07-18 NOTE — Pre-Procedure Instructions (Addendum)
Surgical Instructions    Your procedure is scheduled on Tuesday, July 23, 2022 at 2:30 PM.  Report to [facility] Main Entrance "A" at 12:30 PM., then check in with the Admitting office.  Call this number if you have problems the morning of surgery:  [phone]   If you have any questions prior to your surgery date call [phone]: Open Monday-Friday 8am-4pm  *If you experience any cold or flu symptoms such as cough, fever, chills, shortness of breath, etc. between now and your scheduled surgery, please notify us.*    Remember:  Do not eat after midnight the night before your surgery  You may drink clear liquids until 11:00 AM the morning of your surgery.   Clear liquids allowed are: Water, Non-Citrus Juices (without pulp), Carbonated Beverages, Clear Tea, Black Coffee Only (NO MILK, CREAM OR POWDERED CREAMER of any kind), and Gatorade.    Take these medicines the morning of surgery with A SIP OF WATER:  amLODipine (NORVASC)  cetirizine (ZYRTEC)  fluticasone (FLONASE)  loratadine (CLARITIN)  pantoprazole (PROTONIX)  TRELEGY ELLIPTA   IF NEEDED: acetaminophen (TYLENOL)  albuterol (VENTOLIN HFA)  benzonatate (TESSALON)  oxycodone (OXY-IR)   Please bring all inhalers with you the day of surgery.    As of today, STOP taking any Aspirin (unless otherwise instructed by your surgeon) Aleve, Naproxen, Ibuprofen, Motrin, Advil, Goody's, BC's, all herbal medications, fish oil, and all vitamins.                     Do NOT Smoke (Tobacco/Vaping) for 24 hours prior to your procedure.  If you use a CPAP at night, you may bring your mask/headgear for your overnight stay.   Contacts, glasses, piercing's, hearing aid's, dentures or partials may not be worn into surgery, please bring cases for these belongings.    For patients admitted to the hospital, discharge time will be determined by your treatment team.   Patients discharged the day of surgery will not be allowed to drive  home, and someone needs to stay with them for 24 hours.  SURGICAL WAITING ROOM VISITATION Patients having surgery or a procedure may have two support people in the waiting area. Visitors may stay in the waiting area during the procedure and switch out with other visitors if needed. Children under the age of 3 must have an adult accompany them who is not the patient. If the patient needs to stay at the hospital during part of their recovery, the visitor guidelines for inpatient rooms apply.  Please refer to the [facility] website for the visitor guidelines for Inpatients (after your surgery is over and you are in a regular room).                                    [facility]- Preparing for Total Shoulder Arthroplasty   Before surgery, you can play an important role. Because skin is not sterile, your skin needs to be as free of germs as possible. You can reduce the number of germs on your skin by using the following products. Benzoyl Peroxide Gel Reduces the number of germs present on the skin Applied twice a day to shoulder area starting two days before surgery   Chlorhexidine Gluconate (CHG) Soap An antiseptic cleaner that kills germs and bonds with the skin to continue killing germs even after washing Used for showering the night before surgery and morning of  surgery   Oral Hygiene is also important to reduce your risk of infection.                                    Remember - BRUSH YOUR TEETH THE MORNING OF SURGERY WITH YOUR REGULAR TOOTHPASTE  ==================================================================  Please follow these instructions carefully:  BENZOYL PEROXIDE 5% GEL  Please do not use if you have an allergy to benzoyl peroxide.   If your skin becomes reddened/irritated stop using the benzoyl peroxide.  Starting two days before surgery, apply as follows: Apply benzoyl peroxide in the morning and at night. Apply after taking a shower. If you are not taking a shower  clean entire shoulder front, back, and side along with the armpit with a clean wet washcloth.  Place a quarter-sized dollop on your shoulder and rub in thoroughly, making sure to cover the front, back, and side of your shoulder, along with the armpit.   2 days before ____ AM   ____ PM              1 day before ____ AM   ____ PM                             Do this twice a day for two days.  (Last application is the night before surgery, AFTER using the CHG soap as described below).  Do NOT apply benzoyl peroxide gel on the day of surgery.  CHLORHEXIDINE GLUCONATE (CHG) SOAP  Please do not use if you have an allergy to CHG or antibacterial soaps. If your skin becomes reddened/irritated stop using the CHG.   Do not shave (including legs and underarms) for at least 48 hours prior to first CHG shower. It is OK to shave your face.  Starting the night before surgery, use CHG soap as follows:  Shower the NIGHT BEFORE SURGERY and MORNING OF SURGERY with CHG.  If you choose to wash your hair, wash your hair first as usual with your normal shampoo.  After shampooing, rinse your hair and body thoroughly to remove the shampoo.  Use CHG as you would any other liquid soap.  You can apply CHG directly to the skin and wash gently with a scrungie or a clean washcloth.  Apply the CHG soap to your body ONLY FROM THE NECK DOWN.  Do not use on open wounds or open sores.  Avoid contact with your eyes, ears, mouth, and genitals (private parts).  Wash face and genitals (private parts) with your normal soap.  Wash thoroughly, paying special attention to the area where your surgery will be performed.  Thoroughly rinse your body with warm water from the neck down.  DO NOT shower/wash with your normal soap after using and rinsing off the CHG soap.   Pat yourself dry with a CLEAN TOWEL.    Apply benzoyl peroxide.   Wear CLEAN PAJAMAS to bed the night before surgery; wear comfortable clothes the morning  of surgery.  Place CLEAN SHEETS on your bed the night of your first shower and DO NOT SLEEP WITH PETS.   Day of Surgery: Take a shower with CHG soap. Do not wear jewelry. Do not wear lotions, powders, colognes, or deodorant. Do not shave 48 hours prior to surgery.  Men may shave face and neck. Do not bring valuables to the hospital.  [facility]  is not responsible for any belongings or valuables. Wear Clean/Comfortable clothing the morning of surgery Do not apply any deodorants/lotions.   Remember to brush your teeth WITH YOUR REGULAR TOOTHPASTE.   Please read over the following fact sheets that you were given.  If you received a COVID test during your pre-op visit  it is requested that you wear a mask when out in public, stay away from anyone that may not be feeling well and notify your surgeon if you develop symptoms. If you have been in contact with anyone that has tested positive in the last 10 days please notify you surgeon.

## 2022-07-19 ENCOUNTER — Encounter (HOSPITAL_COMMUNITY)
Admission: RE | Admit: 2022-07-19 | Discharge: 2022-07-19 | Disposition: A | Discharge status: Home / Self Care | Payer: Medicare HMO | Source: Ambulatory Visit | Attending: Orthopaedic Surgery | Admitting: Orthopaedic Surgery

## 2022-07-19 ENCOUNTER — Other Ambulatory Visit: Payer: Self-pay

## 2022-07-19 ENCOUNTER — Encounter (HOSPITAL_COMMUNITY): Payer: Self-pay

## 2022-07-19 VITALS — BP 152/59 | HR 50 | Temp 97.5°F | Resp 16 | Ht 67.0 in | Wt 142.1 lb

## 2022-07-19 DIAGNOSIS — D62 Acute posthemorrhagic anemia: Secondary | ICD-10-CM | POA: Diagnosis not present

## 2022-07-19 DIAGNOSIS — M19011 Primary osteoarthritis, right shoulder: Secondary | ICD-10-CM | POA: Insufficient documentation

## 2022-07-19 DIAGNOSIS — J439 Emphysema, unspecified: Secondary | ICD-10-CM | POA: Insufficient documentation

## 2022-07-19 DIAGNOSIS — Z87891 Personal history of nicotine dependence: Secondary | ICD-10-CM | POA: Diagnosis not present

## 2022-07-19 DIAGNOSIS — I493 Ventricular premature depolarization: Secondary | ICD-10-CM | POA: Diagnosis not present

## 2022-07-19 DIAGNOSIS — Z01812 Encounter for preprocedural laboratory examination: Secondary | ICD-10-CM | POA: Insufficient documentation

## 2022-07-19 DIAGNOSIS — I10 Essential (primary) hypertension: Secondary | ICD-10-CM | POA: Diagnosis not present

## 2022-07-19 DIAGNOSIS — Z01818 Encounter for other preprocedural examination: Secondary | ICD-10-CM

## 2022-07-19 HISTORY — DX: Cardiac arrhythmia, unspecified: I49.9

## 2022-07-19 HISTORY — DX: Pneumonia, unspecified organism: J18.9

## 2022-07-19 LAB — CBC
HCT: 41 % (ref 39.0–52.0)
Hemoglobin: 12.9 g/dL — ABNORMAL LOW (ref 13.0–17.0)
MCH: 28.7 pg (ref 26.0–34.0)
MCHC: 31.5 g/dL (ref 30.0–36.0)
MCV: 91.1 fL (ref 80.0–100.0)
Platelets: 321 10*3/uL (ref 150–400)
RBC: 4.5 MIL/uL (ref 4.22–5.81)
RDW: 14.6 % (ref 11.5–15.5)
WBC: 7.5 10*3/uL (ref 4.0–10.5)
nRBC: 0 % (ref 0.0–0.2)

## 2022-07-19 LAB — BASIC METABOLIC PANEL
Anion gap: 9 (ref 5–15)
BUN: 24 mg/dL — ABNORMAL HIGH (ref 8–23)
CO2: 23 mmol/L (ref 22–32)
Calcium: 9.3 mg/dL (ref 8.9–10.3)
Chloride: 106 mmol/L (ref 98–111)
Creatinine, Ser: 0.77 mg/dL (ref 0.61–1.24)
GFR, Estimated: >60 mL/min (ref >60)
Glucose, Bld: 128 mg/dL — ABNORMAL HIGH (ref 70–99)
Potassium: 4.2 mmol/L (ref 3.5–5.1)
Sodium: 138 mmol/L (ref 135–145)

## 2022-07-19 LAB — SURGICAL PCR SCREEN
MRSA, PCR: NEGATIVE
Staphylococcus aureus: NEGATIVE

## 2022-07-19 NOTE — Pre-Procedure Instructions (Addendum)
Surgical Instructions    Your procedure is scheduled on Tuesday, July 23, 2022   Report to [place] Entrance "A" at 12:30 PM., then check in with the Admitting office.  Call this number if you have problems the morning of surgery:  [phone]   If you have any questions prior to your surgery date call [phone]: Open Monday-Friday 8am-4pm  *If you experience any cold or flu symptoms such as cough, fever, chills, shortness of breath, etc. between now and your scheduled surgery, please notify us.*    Remember:  Do not eat after midnight the night before your surgery  You may drink clear liquids until 11:30 AM the morning of your surgery.   Clear liquids allowed are: Water, Non-Citrus Juices (without pulp), Carbonated Beverages, Clear Tea, Black Coffee Only (NO MILK, CREAM OR POWDERED CREAMER of any kind), and Gatorade.  Patient Instructions  The night before surgery:  No food after midnight. ONLY clear liquids after midnight  The day of surgery (if you do NOT have diabetes):  Drink ONE (1) Pre-Surgery Clear Ensure by 11:30am the morning of surgery. Drink in one sitting. Do not sip.  This drink was given to you during your hospital  pre-op appointment visit. Nothing else to drink after completing the  Pre-Surgery Clear Ensure.           If you have questions, please contact your surgeon's office.     Take these medicines the morning of surgery with A SIP OF WATER:  amLODipine (NORVASC)  cetirizine (ZYRTEC)  fluticasone (FLONASE)  loratadine (CLARITIN)  pantoprazole (PROTONIX)  TRELEGY ELLIPTA   IF NEEDED: acetaminophen (TYLENOL)  albuterol (VENTOLIN HFA)  benzonatate (TESSALON)  oxycodone (OXY-IR)   Please bring all inhalers with you the day of surgery.    As of today, STOP taking any Aspirin (unless otherwise instructed by your surgeon) Aleve, Naproxen, Ibuprofen, Motrin, Advil, Goody's, BC's, all herbal medications, fish oil, and all vitamins.                      Do NOT Smoke (Tobacco/Vaping) for 24 hours prior to your procedure.  If you use a CPAP at night, you may bring your mask/headgear for your overnight stay.   Contacts, glasses, piercing's, hearing aid's, dentures or partials may not be worn into surgery, please bring cases for these belongings.    For patients admitted to the hospital, discharge time will be determined by your treatment team.   Patients discharged the day of surgery will not be allowed to drive home, and someone needs to stay with them for 24 hours.  SURGICAL WAITING ROOM VISITATION Patients having surgery or a procedure may have two support people in the waiting area. Visitors may stay in the waiting area during the procedure and switch out with other visitors if needed. Children under the age of 50 must have an adult accompany them who is not the patient. If the patient needs to stay at the hospital during part of their recovery, the visitor guidelines for inpatient rooms apply.  Please refer to the [facility] website for the visitor guidelines for Inpatients (after your surgery is over and you are in a regular room).                                    [facility]- Preparing for Total Shoulder Arthroplasty   Before surgery, you can play  an important role. Because skin is not sterile, your skin needs to be as free of germs as possible. You can reduce the number of germs on your skin by using the following products. Benzoyl Peroxide Gel Reduces the number of germs present on the skin Applied twice a day to shoulder area starting two days before surgery   Chlorhexidine Gluconate (CHG) Soap An antiseptic cleaner that kills germs and bonds with the skin to continue killing germs even after washing Used for showering the night before surgery and morning of surgery   Oral Hygiene is also important to reduce your risk of infection.                                    Remember - BRUSH YOUR TEETH THE MORNING OF  SURGERY WITH YOUR REGULAR TOOTHPASTE  ==================================================================  Please follow these instructions carefully:  BENZOYL PEROXIDE 5% GEL  Please do not use if you have an allergy to benzoyl peroxide.   If your skin becomes reddened/irritated stop using the benzoyl peroxide.  Starting two days before surgery, apply as follows: Apply benzoyl peroxide in the morning and at night. Apply after taking a shower. If you are not taking a shower clean entire shoulder front, back, and side along with the armpit with a clean wet washcloth.  Place a quarter-sized dollop on your shoulder and rub in thoroughly, making sure to cover the front, back, and side of your shoulder, along with the armpit.   07/21/22- 2 days before ____ AM   ____ PM         07/22/22-1 day before ____ AM   ____ PM                             Do this twice a day for two days.  (Last application is the night before surgery, AFTER using the CHG soap as described below).  Do NOT apply benzoyl peroxide gel on the day of surgery.  CHLORHEXIDINE GLUCONATE (CHG) SOAP  Please do not use if you have an allergy to CHG or antibacterial soaps. If your skin becomes reddened/irritated stop using the CHG.   Do not shave (including legs and underarms) for at least 48 hours prior to first CHG shower. It is OK to shave your face.  Starting the night before surgery, use CHG soap as follows:  Shower the NIGHT BEFORE SURGERY and MORNING OF SURGERY with CHG.  If you choose to wash your hair, wash your hair first as usual with your normal shampoo.  After shampooing, rinse your hair and body thoroughly to remove the shampoo.  Use CHG as you would any other liquid soap.  You can apply CHG directly to the skin and wash gently with a scrungie or a clean washcloth.  Apply the CHG soap to your body ONLY FROM THE NECK DOWN.  Do not use on open wounds or open sores.  Avoid contact with your eyes, ears, mouth,  and genitals (private parts).  Wash face and genitals (private parts) with your normal soap.  Wash thoroughly, paying special attention to the area where your surgery will be performed.  Thoroughly rinse your body with warm water from the neck down.  DO NOT shower/wash with your normal soap after using and rinsing off the CHG soap.   Pat yourself dry with a CLEAN TOWEL.  Apply benzoyl peroxide.   Wear CLEAN PAJAMAS to bed the night before surgery; wear comfortable clothes the morning of surgery.  Place CLEAN SHEETS on your bed the night of your first shower and DO NOT SLEEP WITH PETS.   Day of Surgery: Take a shower with CHG soap. Do not wear jewelry. Do not wear lotions, powders, colognes, or deodorant. Do not shave 48 hours prior to surgery.  Men may shave face and neck. Do not bring valuables to the hospital.  [facility] is not responsible for any belongings or valuables. Wear Clean/Comfortable clothing the morning of surgery Do not apply any deodorants/lotions.   Remember to brush your teeth WITH YOUR REGULAR TOOTHPASTE.   Please read over the following fact sheets that you were given.  If you received a COVID test during your pre-op visit  it is requested that you wear a mask when out in public, stay away from anyone that may not be feeling well and notify your surgeon if you develop symptoms. If you have been in contact with anyone that has tested positive in the last 10 days please notify you surgeon.

## 2022-07-19 NOTE — Progress Notes (Signed)
PCP - stacey blythe Cardiologist - Roxan Hockey Pulmonologist: Elsworth Soho  PPM/ICD - denies   Chest x-ray - n/a EKG - 10/30/21 Stress Test - denies ECHO - 09/29/19 Cardiac Cath - denies  Sleep Study - denies   As of today, STOP taking any Aspirin (unless otherwise instructed by your surgeon) Aleve, Naproxen, Ibuprofen, Motrin, Advil, Goody's, BC's, all herbal medications, fish oil, and all vitamins.   ERAS Protcol -yes PRE-SURGERY Ensure or G2- ensure ordered and give  COVID TEST- not needed   Anesthesia review: yes, COPD history and review note from [facility] from earlier this year please. Make sure he doesn't need clearance.  Patient denies shortness of breath, fever, cough and chest pain at PAT appointment   All instructions explained to the patient, with a verbal understanding of the material. Patient agrees to go over the instructions while at home for a better understanding. Patient also instructed to self quarantine after being tested for COVID-19. The opportunity to ask questions was provided.

## 2022-07-22 ENCOUNTER — Encounter (HOSPITAL_COMMUNITY): Payer: Self-pay | Admitting: Vascular Surgery

## 2022-07-22 ENCOUNTER — Telehealth: Payer: Self-pay | Admitting: Cardiovascular Disease

## 2022-07-22 ENCOUNTER — Telehealth: Payer: Self-pay | Admitting: Family Medicine

## 2022-07-22 ENCOUNTER — Encounter (HOSPITAL_COMMUNITY): Payer: Self-pay

## 2022-07-22 NOTE — Telephone Encounter (Signed)
   Pre-operative Risk Assessment    Patient Name: [name]  DOB: 04-02-1946 MRN: [id number]      Request for Surgical Clearance    Procedure:   RIGHT REVERSE SHOULDER ARTHROPLASTY  Date of Surgery:  Clearance TBD                                 Surgeon:  DR. Vanetta Mulders Surgeon's Group or Practice Name:  [facility] Phone number:  [phone] Fax number:  [phone]   Type of Clearance Requested:   - Medical ; NO MEDICATIONS LISTED AS NEEDING TO BE HELD   Type of Anesthesia:   REGIONAL   Additional requests/questions:    [name]   07/22/2022, 5:05 PM

## 2022-07-22 NOTE — Telephone Encounter (Signed)
[facility] calling about a clearance for pt. She states she faxed to [phone]. Informed her I did not see fax yet, but will inform preop to be on lookout. She states pt is eager because he was supposed to be scheduled for today.

## 2022-07-22 NOTE — Telephone Encounter (Signed)
Patient called to advise that his surgery for tomorrow was cancelled and it is in regards to Dr. Charlett Blake and they have concerns about his heart. Advised patient that it sounds like anesthesiologist may be requesting surgical clearance from [place], but that he should call the surgeon's office first to find out what the issue is and what needs to happen so he can get rescheduled. Patient understands.

## 2022-07-22 NOTE — Progress Notes (Signed)
Anesthesia Chart Review:  Case: [id number] Date/Time: 07/23/22 1415   Procedure: RIGHT REVERSE SHOULDER ARTHROPLASTY (Right: Shoulder)   Anesthesia type: Regional   Pre-op diagnosis: RIGHT GLENOHUMERAL OSTEOARTHRITIS   Location: [facility] OR ROOM 04 / [facility]   Surgeons: Vanetta Mulders, MD       DISCUSSION: Patient is a 76 year old male scheduled for the above procedure.  History includes former smoker (quit 09/03/00), COPD Gold II D/emphysema, bronchiectasis/lung abscess (s/p RML/RLL lung resection 1996; recurrent hemoptysis, s/p bronchoscopy 07/12/20, non-diagnostic, + Staph, Acinetobacter, s/p 4+ weeks antibiotics to clear), HTN, dyspnea, GERD, atypical chest pain (2014 in setting of URI), PVCs, spinal surgery (C3-7 ACDF 05/23/08; redo L1-2 decompressive laminectomy/L1-2 PLIF/pedicle screw fixation L1-5 06/28/09; L2-5 PLIF 09/12/08), sinus surgery (12/20/03), Upper GI bleed (10/2021, s/p 3 units PRBC, EGD 10/31/21, non-bleeding duodenal ulcer s/p bipolar cautery; NSAIDS discontinued, started on oral iron).      Of note, during 07/12/20 bronchoscopy for recurrent hemoptysis, CT surgeon Dr. Roxan Hockey commented, "Markedly distorted tracheal and right-sided airway anatomy, no endobronchial lesions.  Bronchial stump was well healed." He last had [facility] for EGD on 10/31/21. Gastroenterologists requested EGD be converted from [place] to [facility] due to significant GI bleed. RSI performed. Miller and 2 used to place 7.5 mm oral ETT, grade I view.    He was seeing cardiologist Dr. Johnsie Cancel for HTN and PVCs. PVCs felt related to lung disease. Echo updated 09/29/19 due to frequent PVCs. Echo showed 50-55%, no regional wall motion abnormalities, mildly reduced global RV systolic function, trivial MR.  One year follow-up was planned. Last cardiology encounter is from 09/07/21 when DOD cardiologist Dr. Larae Grooms was contacted by a primary care provider about asymptomatic bigeminy PVCs. Could consider monitor in the future, but no  acute management needed, and b-blocker may worsen COPD.   Last PCP visit with Dr. Charlett Blake on 06/12/22. He had been seen in ED ([facility]) for head injury following a fall. By ED notes, he was "pulling a rope to tighten a load on a trailer when he tripped and lost his balance. He did have transient LOC. His brother witnessed his fall. He states he was out proximal for a few seconds." He sustained a right temporal laceration requiring suture repair. CT showed no evidence of acute intracranial abnormality. There is no skull fracture. No intracranial bleed. He had chronic findings of microvascular disease. He was discharged home. She noted he still felt a little dizzy. He had also completed a course of Amoxcillin and Prednisone but reported some worsening symptoms again like cough, congestion, weakness and fatigue. He was given a course of Doxycyline and a Medrol dosepak to use if symptoms continued to worsen. He was using Trellegy Ellipta and as needed albuterol.  He was also recently given RSV, COVID-19, and high dose Flu immunizations.   Reviewed above with anesthesiologist Suella Broad, MD. Patient with frequent PVCs, with bigeminy pattern on 09/06/21 (in setting of back pain and possible COPD exacerbation). Frequent PACs and PVCs noted on 10/30/21 EKG during upper GI bleed admission. Echo in 2021 showed EF 50-55%, no regional wall motion abnormalities, but new mildly reduced global RV systolic function. He will need preoperative cardiology evaluation prior to elective surgery. In addition, he has had prior RML and RLL lung resection and has chronic cough and has been treated for recurrent COPD exacerbations in the last few months, as well as a mechanical fall with brief LOC in September (or likely COPD exacerbations), so will need medical clearance as well. He  is not currently followed by pulmonology, but his chest imaging was stable at his follow-up with CT surgery earlier this year on 10/30/21. He was  released from GI in April, and his H/H is stable at 12.9/41.0 on 07/19/22.   I spoke with April at Dr. Eddie Dibbles office regarding need for preoperative cardiology and medical input for elective surgery.  She followed up indicating they had received a signed clearance note from Dr. Charlett Blake from a medical and cardiac standpoint. I also reviewed this with anesthesiologist Dr. Smith Robert; However, preoperative evaluation with cardiology still recommended for elective procedure given known PVCs, 2021 echo echo findings, and overdue to recommended follow-up. Surgery will be postponed.    VS: BP (!) 152/59   Pulse (!) 50   Temp (!) 36.4 C   Resp 16   Ht _0  (1.702 m)   Wt 64.5 kg   SpO2 97%   BMI 22.26 kg/m   PROVIDERS: Mosie Lukes, MD is PCP   Modesto Charon, MD is CT surgeon. Last visit 10/30/21. Hemoptysis from 2021 resolved after prolonged course of antibiotics.  Still with persistent cough.  Right cavitary lesion had resolved. 1 year CT surgeon follow-up with chest CT recommended. Of note at that visit he reported melena with symptomatic anemia and was referred to the ED.    Jenkins Rouge, MD is cardiologist. Last visit 09/28/19 for HTN and PVC follow-up.  He notes, "No cardiac issues...breathing good." PVCs felt related to lung disease. Updated echo ordered given PVCs. One year follow-up planned.   Vernelle Emerald, MD is rheumatologist  Lucio Edward, MD is GI. As needed follow-up per 12/03/21 office visit.   Kara Mead, MD is pulmonologist. Last visit 05/22/20 with Eric Form, NP. He was referred to CT surgeon for monitoring of RUL cystic region.    LABS: Preoperative labs noted.  (all labs ordered are listed, but only abnormal results are displayed)  Labs Reviewed  BASIC METABOLIC PANEL - Abnormal; Notable for the following components:      Result Value   Glucose, Bld 128 (*)    BUN 24 (*)    All other components within normal limits  CBC - Abnormal; Notable for the  following components:   Hemoglobin 12.9 (*)    All other components within normal limits  SURGICAL PCR SCREEN    PFTs 07/06/18: FVC 2.74 (71%), post 2.79 (72%). FEV1 1.72 (61%), post 1.79 (63%). DLCO UNC 15.04 (53%).   IMAGES: CT Right shoulder 07/09/22: IMPRESSION: 1. Severe osteoarthritis of the glenohumeral joint. Complete tear of the supraspinatus and infraspinatus tendons. Mild atrophy of the supraspinatus and infraspinatus muscles. Severe atrophy of the infraspinatus muscle. 2. Aortic Atherosclerosis (ICD10-I70.0) and Emphysema (ICD10-J43.9).  CT Head 05/22/22 ([facility]): IMPRESSION: 1. No acute intracranial abnormality. MRI is more sensitive for acute ischemia.  2. Findings compatible with mild sequela of chronic microvascular disease and age commensurate atrophy.  3. Small laceration and contusion along the anterolateral right frontal scalp. No acute fracture.   1V PCXR 10/30/21: FINDINGS: Cardiac silhouette and mediastinal contours are unchanged and within normal limits. There is again moderate right lung volume loss. Stable right apical thickening. Scarring within the right lung base unchanged. No new focal airspace opacity. No definite pleural effusion. No pneumothorax. ACDF hardware again overlies the cervical spine. High-riding humeral heads bilaterally suggesting full-thickness superior rotator cuff tears. Severe right greater than left glenohumeral osteoarthritis. IMPRESSION: 1. No significant change in chronic right lung volume loss and scarring. 2. No acute lung  process.  CT Chest 10/30/21: IMPRESSION: 1. Status post right upper and right middle lobectomies. 2. Stable appearance of pleural thickening overlying the apical portion of the right lung. The cavitary component within this area has resolved in the interval. 3. Stable appearance of pleuroparenchymal scarring within the right lung base with stable chronic areas of loculated pleural gas overlying the  posterior left base. 4. Aortic Atherosclerosis (ICD10-I70.0) and Emphysema (ICD10-J43.9). Coronary artery calcifications.    EKG:  EKG 10/30/21:  Sinus tachycardia Multiple premature complexes, PVCs and PACs RSR prime in V1 or V2, right VCD or RVH  EKG 09/06/21:  Sinus tachycardia Frequent PVCs, ventricular bigeminy RSR prime in V1, nondiagnostic Combined atrial enlargement   CV: TTE 09/29/19: 1. Left ventricular ejection fraction, by visual estimation, is 50 to  55%. The left ventricle has normal function. There is no left ventricular  hypertrophy.   2. The left ventricle has no regional wall motion abnormalities.   3. Abnormal septal motion Frequent ectopy during exam.   4. Global right ventricle has mildly reduced systolic function.The right  ventricular size is mildly enlarged. No increase in right ventricular wall  thickness.   5. Left atrial size was normal.   6. Right atrial size was mildly dilated.   7. Mild mitral annular calcification.   8. The mitral valve is normal in structure. Trivial mitral valve  regurgitation. No evidence of mitral stenosis.   9. The tricuspid valve is normal in structure.  10. The tricuspid valve is normal in structure. Tricuspid valve  regurgitation is not demonstrated.  11. The aortic valve is tricuspid. Aortic valve regurgitation is not  visualized. No evidence of aortic valve sclerosis or stenosis.  12. The pulmonic valve was normal in structure. Pulmonic valve  regurgitation is not visualized.  13. The inferior vena cava is normal in size with greater than 50%  respiratory variability, suggesting right atrial pressure of 3 mmHg.  - Per review by Dr. Johnsie Cancel, "Low normal EF valves ok overall good."; Comparison TTE 01/03/15 LVEF 55-60%, normal RVSF.    48 Hour Holter Monitor 08/03/14: NSR with PVCs. Short run of sustained VT.   US Carotid 07/24/11:  Very mild lack, bilaterally.  0 to 39% bilateral ICA stenosis.   Past Medical History:   Diagnosis Date   Allergic rhinitis    Atypical chest pain 12/01/2014   Back pain 01/12/2013   COPD (chronic obstructive pulmonary disease) (HCC)    FeV1 64%-2007   Dyspnea    Emphysema    GERD (gastroesophageal reflux disease)    History of lung abscess    bronchiectasis with RMLandRLL ersection -1996- Dr Arlyce Dice   Hordeolum externum (stye) 04/23/2015   Right eye   Hypertension    Impaired vision    glasses   Osteoarthritis    hands and knees   Pneumonia    Sinusitis, acute 04/23/2015    Past Surgical History:  Procedure Laterality Date   COLONOSCOPY  11/20/2010   Dr.Stark   ESOPHAGOGASTRODUODENOSCOPY (EGD) WITH PROPOFOL N/A 10/31/2021   Procedure: ESOPHAGOGASTRODUODENOSCOPY (EGD) WITH PROPOFOL;  Surgeon: Gatha Mayer, MD;  Location: [place];  Service: Gastroenterology;  Laterality: N/A;   HEMOSTASIS CONTROL  10/31/2021   Procedure: HEMOSTASIS CONTROL;  Surgeon: Gatha Mayer, MD;  Location: [place];  Service: Gastroenterology;;   HERNIA REPAIR  10/03/2009   HOT HEMOSTASIS N/A 10/31/2021   Procedure: HOT HEMOSTASIS (ARGON PLASMA COAGULATION/BICAP);  Surgeon: Gatha Mayer, MD;  Location: [place];  Service: Gastroenterology;  Laterality: N/A;   left knee     lower back surgery  09/02/2008   06/2009   LUNG SURGERY     RML andRUL removed due to bleeding and bronchiectasis and lung abcess   NECK SURGERY  05/03/2008   right foot surgery     SCLEROTHERAPY  10/31/2021   Procedure: SCLEROTHERAPY;  Surgeon: Gatha Mayer, MD;  [facility]: [facility] ENDOSCOPY;  Service: Gastroenterology;;   TONSILLECTOMY     removed as a child   VIDEO BRONCHOSCOPY N/A 07/12/2020   Procedure: VIDEO BRONCHOSCOPY;  Surgeon: [name], MD;  Location: [facility] OR;  Service: Thoracic;  Laterality: N/A;   VIDEO BRONCHOSCOPY WITH ENDOBRONCHIAL NAVIGATION N/A 07/12/2020   Procedure: VIDEO BRONCHOSCOPY WITH ENDOBRONCHIAL NAVIGATION;  Surgeon: [name], MD;  Location: [facility]  OR;  Service: Thoracic;  Laterality: N/A;    MEDICATIONS:  acetaminophen (TYLENOL) 500 MG tablet   albuterol (VENTOLIN HFA) 108 (90 Base) MCG/ACT inhaler   amLODipine (NORVASC) 5 MG tablet   amLODipine (NORVASC) 5 MG tablet   benzonatate (TESSALON) 100 MG capsule   calcium elemental as carbonate (BARIATRIC TUMS ULTRA) 400 MG chewable tablet   cetirizine (ZYRTEC) 10 MG tablet   COVID-19 mRNA vaccine 2023-2024 (COMIRNATY) SUSP injection   enoxaparin (LOVENOX) 40 MG/0.4ML injection   ferrous sulfate 325 (65 FE) MG tablet   fluticasone (FLONASE) 50 MCG/ACT nasal spray   Glucosamine-Chondroitin 750-600 MG TABS   Guaifenesin 1200 MG TB12   loratadine (CLARITIN) 10 MG tablet   Menthol, Topical Analgesic, (BLUE-EMU MAXIMUM STRENGTH EX)   Multiple Vitamin (MULTIVITAMIN WITH MINERALS) TABS tablet   oxycodone (OXY-IR) 5 MG capsule   pantoprazole (PROTONIX) 20 MG tablet   TRELEGY ELLIPTA 100-62.5-25 MCG/ACT AEPB   vitamin B-12 (CYANOCOBALAMIN) 1000 MCG tablet   No current facility-administered medications for this encounter.    Myra Gianotti, PA-C Surgical Short Stay/Anesthesiology [facility] Phone [phone] [facility] Phone [phone] 07/22/2022 3:05 PM

## 2022-07-23 ENCOUNTER — Encounter (HOSPITAL_COMMUNITY): Admission: RE | Payer: Self-pay | Source: Ambulatory Visit

## 2022-07-23 ENCOUNTER — Encounter (HOSPITAL_COMMUNITY): Admission: RE | Payer: Self-pay | Source: Home / Self Care

## 2022-07-23 ENCOUNTER — Ambulatory Visit (HOSPITAL_COMMUNITY): Admission: RE | Admit: 2022-07-23 | Payer: Medicare HMO | Source: Ambulatory Visit | Admitting: Orthopaedic Surgery

## 2022-07-23 ENCOUNTER — Ambulatory Visit (HOSPITAL_COMMUNITY): Admission: RE | Admit: 2022-07-23 | Payer: Medicare HMO | Source: Home / Self Care | Admitting: Orthopaedic Surgery

## 2022-07-23 SURGERY — ARTHROPLASTY, SHOULDER, TOTAL, REVERSE
Anesthesia: Regional | Site: Shoulder | Laterality: Right

## 2022-07-23 NOTE — Telephone Encounter (Signed)
Pt agreeable to plan of care for in office appt for pre op. Pt has ben scheduled with Nicholes Rough, [facility] 07/29/22 @ 8:50. Pt thanked me for the help and the call. I will update all parties involved in this matter.

## 2022-07-23 NOTE — Telephone Encounter (Signed)
    Primary Cardiologist:Peter Johnsie Cancel, MD  Chart reviewed as part of pre-operative protocol coverage. Because of [name] past medical history and time since last visit, he/she will require a follow-up visit in order to better assess preoperative cardiovascular risk.  Pre-op covering staff: - Please schedule appointment and call patient to inform them. - Please contact requesting surgeon's office via preferred method (i.e, phone, fax) to inform them of need for appointment prior to surgery.  If applicable, this message will also be routed to pharmacy pool and/or primary cardiologist for input on holding anticoagulant/antiplatelet agent as requested below so that this information is available at time of patient's appointment.   Deberah Pelton, NP  07/23/2022, 7:59 AM

## 2022-07-28 NOTE — Progress Notes (Signed)
Office Visit    Patient Name: [name] Date of Encounter: 07/29/2022  PCP:  Mosie Lukes, MD   [place]  Cardiologist:  Jenkins Rouge, MD  Advanced Practice Provider:  No care team member to display Electrophysiologist:  None  HPI    [name] is a 76 y.o. male with a past medical history significant for COPD, lung abscess in 1996 status post RML/RLL resection, hypertension and PVCs presents today for preop clearance.   He has a history of atypical chest pain in setting of URI dating back to 2014.  2D echocardiogram in 2014 and 2015 showed normal LV function and no valvular abnormalities.  PVCs were felt to be related to lung disease.  Seen in the clinic 07/09/2016 and was doing quite well.'  He was admitted 1/28 till 09/30/2016 for COPD exacerbation and CAP.  Treated with IV antibiotics, steroids, nebulized bronchodilators.  He has a 57-pack-year history of smoking.  Seen by pulmonology January 2019 for acute sinusitis placed on prednisone taperm as well as Augmentin.  No history of documented CAD.  He was last seen January 2021 and was doing well at that time.  No cardiac issues.  Breathing was good and BPs were good at home.  Today, he tells me that he needs his right shoulder rebuilt. He has not had any heart related issues. He does have chronic SOB which is lung related. Her has not had any palpitations. He wore a monitor back in 2015 which showed frequent PVCs. He is asymptomatic from these. He was tried on a medication he though was a beta blocker in the past but got very lightheaded so it was discontinued. He goes [employer] and fishing. He is not limited from a CV standpoint however he rode horses for 45 years but after his most recent back surgery he gave riding up due to the pain. He was on Maloxicam for year but then bled so he cannot have NSAIDS. He does have frequent PVCs on EKG today but were present on monitor back in 2015 and previous  EKGs. He scored a 5.62 mets on the DASI, which is higher than the minimum 4 mets requirement.   Reports no shortness of breath nor dyspnea on exertion. Reports no chest pain, pressure, or tightness. No edema, orthopnea, PND. Reports no palpitations.    Past Medical History    Past Medical History:  Diagnosis Date   Allergic rhinitis    Atypical chest pain 12/01/2014   Back pain 01/12/2013   COPD (chronic obstructive pulmonary disease) (HCC)    FeV1 64%-2007   Dyspnea    Dysrhythmia    PVCs   Emphysema    GERD (gastroesophageal reflux disease)    History of lung abscess    bronchiectasis with RMLandRLL ersection -1996- Dr Arlyce Dice   Hordeolum externum (stye) 04/23/2015   Right eye   Hypertension    Impaired vision    glasses   Osteoarthritis    hands and knees   Pneumonia    Sinusitis, acute 04/23/2015   Past Surgical History:  Procedure Laterality Date   COLONOSCOPY  11/20/2010   Dr.Stark   ESOPHAGOGASTRODUODENOSCOPY (EGD) WITH PROPOFOL N/A 10/31/2021   Procedure: ESOPHAGOGASTRODUODENOSCOPY (EGD) WITH PROPOFOL;  Surgeon: Gatha Mayer, MD;  Location: [facility];  Service: Gastroenterology;  Laterality: N/A;   HEMOSTASIS CONTROL  10/31/2021   Procedure: HEMOSTASIS CONTROL;  Surgeon: Gatha Mayer, MD;  Location: [facility];  Service: Gastroenterology;;  HERNIA REPAIR  10/03/2009   HOT HEMOSTASIS N/A 10/31/2021   Procedure: HOT HEMOSTASIS (ARGON PLASMA COAGULATION/BICAP);  Surgeon: Gatha Mayer, MD;  Location: [facility];  Service: Gastroenterology;  Laterality: N/A;   left knee     lower back surgery  09/02/2008   06/2009   LUNG SURGERY     RML andRUL removed due to bleeding and bronchiectasis and lung abcess   NECK SURGERY  05/03/2008   right foot surgery     SCLEROTHERAPY  10/31/2021   Procedure: SCLEROTHERAPY;  Surgeon: Gatha Mayer, MD;  Location: [facility] ENDOSCOPY;  Service: Gastroenterology;;   TONSILLECTOMY     removed as a child   VIDEO  BRONCHOSCOPY N/A 07/12/2020   Procedure: VIDEO BRONCHOSCOPY;  Surgeon: [name], MD;  Location: [facility];  Service: Thoracic;  Laterality: N/A;   VIDEO BRONCHOSCOPY WITH ENDOBRONCHIAL NAVIGATION N/A 07/12/2020   Procedure: VIDEO BRONCHOSCOPY WITH ENDOBRONCHIAL NAVIGATION;  Surgeon: [name], MD;  Location: [facility] OR;  Service: Thoracic;  Laterality: N/A;    Allergies  Allergies  Allergen Reactions   Losartan Other (See Comments)    weakness   Meloxicam     EKGs/Labs/Other Studies Reviewed:   The following studies were reviewed today:  2D ECHO: 01/03/2015 LV EF: 55% -   60% Study Conclusions - Left ventricle: The cavity size was normal. Wall thickness was   normal. Systolic function was normal. The estimated ejection   fraction was in the range of 55% to 60%. Wall motion was normal;   there were no regional wall motion abnormalities. Doppler   parameters are consistent with abnormal left ventricular   relaxation (grade 1 diastolic dysfunction). The E/e&' ratio is   between 8-15, suggseting indeterminate LV Filling pressure. - Left atrium: The atrium was normal in size. - Right atrium: The atrium was at the upper limits of normal in   size. - Inferior vena cava: The vessel was normal in size. The   respirophasic diameter changes were in the normal range (>= 50%),   consistent with normal central venous pressure. Impressions: - Compared to a prior echo in 2014, there do not appear to be any   significant changes      EKG:  EKG is  ordered today.  The ekg ordered today demonstrates NSR rate 84 bom with frequent PVCs  Recent Labs: 10/31/2021: Magnesium 1.8 06/13/2022: ALT 19; TSH 0.64 07/19/2022: BUN 24; Creatinine, Ser 0.77; Hemoglobin 12.9; Platelets 321; Potassium 4.2; Sodium 138  Recent Lipid Panel    Component Value Date/Time   CHOL 139 06/13/2022 1213   TRIG 191.0 (H) 06/13/2022 1213   HDL 49.10 06/13/2022 1213   CHOLHDL 3 06/13/2022 1213   VLDL  38.2 06/13/2022 1213   LDLCALC 52 06/13/2022 1213   LDLCALC 86 06/26/2020 1158   LDLDIRECT 85 07/22/2014 1031    Home Medications   Current Meds  Medication Sig   acetaminophen (TYLENOL) 500 MG tablet Take 2 tablets (1,000 mg total) by mouth every 8 (eight) hours as needed for moderate pain.   albuterol (VENTOLIN HFA) 108 (90 Base) MCG/ACT inhaler INHALE 2 PUFFS INTO THE LUNGS EVERY 6 HOURS AS NEEDED FOR WHEEZING OR SHORTNESS OF BREATH.   amLODipine (NORVASC) 5 MG tablet Take 1 tablet (5 mg total) by mouth daily.   benzonatate (TESSALON) 100 MG capsule TAKE 1 CAPSULE BY MOUTH TWICE DAILY AS NEEDED FOR COUGH   calcium elemental as carbonate (BARIATRIC TUMS ULTRA) 400 MG chewable tablet Chew 1,000 mg  by mouth 2 (two) times daily.    cetirizine (ZYRTEC) 10 MG tablet Take 10 mg by mouth daily.   COVID-19 mRNA vaccine 2023-2024 (COMIRNATY) SUSP injection Inject into the muscle.   fluticasone (FLONASE) 50 MCG/ACT nasal spray USE 2 SPRAYS IN EACH NOSTRIL EVERY DAY   Glucosamine-Chondroitin 750-600 MG TABS Take 1 tablet by mouth 2 (two) times daily.   Guaifenesin 1200 MG TB12 Take 600 mg by mouth 2 (two) times daily.   loratadine (CLARITIN) 10 MG tablet Take 10 mg by mouth daily.   Menthol, Topical Analgesic, (BLUE-EMU MAXIMUM STRENGTH EX) Apply 1 application topically daily as needed (arthritis pain).    montelukast (SINGULAIR) 10 MG tablet Take 10 mg by mouth daily.   Multiple Vitamin (MULTIVITAMIN WITH MINERALS) TABS tablet Take 1 tablet by mouth daily.    oxycodone (OXY-IR) 5 MG capsule Take 1 capsule (5 mg total) by mouth every 4 (four) hours as needed (severe pain).   pantoprazole (PROTONIX) 20 MG tablet Take 1 tablet (20 mg total) by mouth daily.   TRELEGY ELLIPTA 100-62.5-25 MCG/ACT AEPB INHALE 1 PUFF INTO THE LUNGS DAILY   triamcinolone cream (KENALOG) 0.1 % Apply 1 Application topically 2 (two) times daily.   vitamin B-12 (CYANOCOBALAMIN) 1000 MCG tablet Take 1 tablet (1,000 mcg total)  by mouth daily.     Review of Systems      All other systems reviewed and are otherwise negative except as noted above.  Physical Exam    VS:  BP 132/64   Pulse 84   Ht '5\' 7"'$  (1.702 m)   Wt 144 lb 12.8 oz (65.7 kg)   SpO2 94%   BMI 22.68 kg/m  , BMI Body mass index is 22.68 kg/m.  Wt Readings from Last 3 Encounters:  07/29/22 144 lb 12.8 oz (65.7 kg)  07/19/22 142 lb 1.6 oz (64.5 kg)  06/13/22 139 lb 3.2 oz (63.1 kg)     GEN: Well nourished, well developed, in no acute distress. HEENT: normal. Neck: Supple, no JVD, carotid bruits, or masses. Cardiac: RRR, no murmurs, rubs, or gallops. No clubbing, cyanosis, edema.  Radials/PT 2+ and equal bilaterally.  Respiratory:  Respirations regular and unlabored, clear to auscultation bilaterally. GI: Soft, nontender, nondistended. MS: No deformity or atrophy. Skin: Warm and dry, no rash. Neuro:  Strength and sensation are intact. Psych: Normal affect.  Assessment & Plan    Preop Op  Mr. Chagnon perioperative risk of a major cardiac event is 0.4% according to the Revised Cardiac Risk Index (RCRI).  Therefore, he is at low risk for perioperative complications.   His functional capacity is good at 5.62 METs according to the Duke Activity Status Index (DASI). Recommendations: According to ACC/AHA guidelines, no further cardiovascular testing needed.  The patient may proceed to surgery at acceptable risk.     Hypertension -well controlled today  -continue current medication regimen  PVCs -occasional now, asymptomatic -reviewed monitor from 2015 and previous EKGs  COPD -chronic, stable         Disposition: Follow up 3 months with Jenkins Rouge, MD or APP.  Signed, Elgie Collard, PA-C 07/29/2022, 12:50 PM [facility]

## 2022-07-29 ENCOUNTER — Ambulatory Visit: Payer: Medicare HMO | Attending: Physician Assistant | Admitting: Physician Assistant

## 2022-07-29 ENCOUNTER — Encounter: Payer: Self-pay | Admitting: Physician Assistant

## 2022-07-29 VITALS — BP 132/64 | HR 84 | Ht 67.0 in | Wt 144.8 lb

## 2022-07-29 DIAGNOSIS — J441 Chronic obstructive pulmonary disease with (acute) exacerbation: Secondary | ICD-10-CM

## 2022-07-29 DIAGNOSIS — I10 Essential (primary) hypertension: Secondary | ICD-10-CM

## 2022-07-29 DIAGNOSIS — I493 Ventricular premature depolarization: Secondary | ICD-10-CM | POA: Diagnosis not present

## 2022-07-29 NOTE — Patient Instructions (Signed)
Medication Instructions:  Your physician recommends that you continue on your current medications as directed. Please refer to the Current Medication list given to you today.  *If you need a refill on your cardiac medications before your next appointment, please call your pharmacy*   Lab Work: None If you have labs (blood work) drawn today and your tests are completely normal, you will receive your results only by: Box (if you have MyChart) OR A paper copy in the mail If you have any lab test that is abnormal or we need to change your treatment, we will call you to review the results.    Follow-Up: At [facility], you and your health needs are our priority.  As part of our continuing mission to provide you with exceptional heart care, we have created designated Provider Care Teams.  These Care Teams include your primary Cardiologist (physician) and Advanced Practice Providers (APPs -  Physician Assistants and Nurse Practitioners) who all work together to provide you with the care you need, when you need it.   Your next appointment:   January 2024  The format for your next appointment:   In Person  Provider:   Jenkins Rouge, MD    Important Information About Sugar

## 2022-08-01 ENCOUNTER — Telehealth: Payer: Self-pay | Admitting: Orthopaedic Surgery

## 2022-08-01 NOTE — Telephone Encounter (Signed)
Patient left a message stating the shoulder CT showed two torn tendons also. Per patient, that has not been discussed. He would like to know if those can be repaired during surgery as well? Please call to advise.

## 2022-08-02 ENCOUNTER — Telehealth: Payer: Self-pay | Admitting: Orthopaedic Surgery

## 2022-08-02 ENCOUNTER — Telehealth: Payer: Self-pay

## 2022-08-02 NOTE — Telephone Encounter (Signed)
Pt called in stating that Dr. Sammuel Hines sent over a referral for PT to our location... Pt stated that he would like to do PT at [facility] [place] in [place] [address]... PT phone number is [phone]... Pt requesting callback

## 2022-08-02 NOTE — Telephone Encounter (Signed)
...     Pre-operative Risk Assessment    Patient Name: [name]  DOB: [birth year] MRN: [id number]      Request for Surgical Clearance    Procedure:   RIGHT REVERSE SHOULDER ARTHROPLASTY  Date of Surgery:  Clearance TBD                                 Surgeon:  DR Vanetta Mulders Surgeon's Group or Practice Name:   Phone number:  [phone] Fax number:  [phone]   Type of Clearance Requested:   - Medical    Type of Anesthesia:  Not Indicated   Additional requests/questions:    Signed, [name]   08/02/2022, 2:25 PM

## 2022-08-04 ENCOUNTER — Encounter: Payer: Self-pay | Admitting: Family Medicine

## 2022-08-05 ENCOUNTER — Encounter (HOSPITAL_BASED_OUTPATIENT_CLINIC_OR_DEPARTMENT_OTHER): Payer: Medicare HMO | Admitting: Orthopaedic Surgery

## 2022-08-05 ENCOUNTER — Other Ambulatory Visit: Payer: Self-pay | Admitting: Family Medicine

## 2022-08-05 ENCOUNTER — Other Ambulatory Visit (HOSPITAL_BASED_OUTPATIENT_CLINIC_OR_DEPARTMENT_OTHER): Payer: Self-pay | Admitting: Orthopaedic Surgery

## 2022-08-05 DIAGNOSIS — M12811 Other specific arthropathies, not elsewhere classified, right shoulder: Secondary | ICD-10-CM

## 2022-08-05 MED ORDER — DOXYCYCLINE HYCLATE 100 MG PO TABS: 100.0000 mg | ORAL_TABLET | Freq: Two times a day (BID) | ORAL | 0 refills | Status: DC

## 2022-08-05 NOTE — Telephone Encounter (Signed)
Referral placed in chart  

## 2022-08-07 ENCOUNTER — Other Ambulatory Visit: Payer: Self-pay | Admitting: Family Medicine

## 2022-08-20 ENCOUNTER — Encounter (HOSPITAL_COMMUNITY): Payer: Self-pay | Admitting: Orthopaedic Surgery

## 2022-08-20 ENCOUNTER — Ambulatory Visit (HOSPITAL_BASED_OUTPATIENT_CLINIC_OR_DEPARTMENT_OTHER): Payer: Self-pay | Admitting: Orthopaedic Surgery

## 2022-08-20 ENCOUNTER — Other Ambulatory Visit: Payer: Self-pay

## 2022-08-20 DIAGNOSIS — M12811 Other specific arthropathies, not elsewhere classified, right shoulder: Secondary | ICD-10-CM

## 2022-08-20 NOTE — Progress Notes (Addendum)
PCP - Gwyneth Revels, MD Cardiologist - Alvy Beal, MD  PPM/ICD - denies  Chest x-ray - 10/30/21 EKG - 10/30/21 Stress Test - denies ECHO - 09/29/19 Cardiac Cath - denies  CPAP - denies  Fasting Blood Sugar - n/a  Blood Thinner Instructions: n/a Aspirin Instructions: Patient was instructed: As of today, STOP taking any Aspirin (unless otherwise instructed by your surgeon) Aleve, Naproxen, Ibuprofen, Motrin, Advil, Goody's, BC's, all herbal medications, fish oil, and all vitamins.  ERAS Protcol - yes, until 09:15 o'clock  COVID TEST- n/a  Anesthesia review: yes - pre-op cardiac clearance on 07/29/22  Patient verbally denies any shortness of breath, fever, cough and chest pain during phone call   -------------  SDW INSTRUCTIONS given:  Your procedure is scheduled on Thursday, December 21st, 2023.  Report to [facility] Main Entrance "A" at 09:45 A.M., and check in at the Admitting office.  Call this number if you have problems the morning of surgery:  [phone]   Remember:  Do not eat after midnight the night before your surgery  You may drink clear liquids until 09:15 the morning of your surgery.   Clear liquids allowed are: Water, Non-Citrus Juices (without pulp), Carbonated Beverages, Clear Tea, Black Coffee Only, and Gatorade    Take these medicines the morning of surgery with A SIP OF WATER - Amlodipine, Zyrtec, Flonase, Claritin, Singular, Protonix, Guaifenesin PRN: Tylenol, Tessalon, Oxycodone, inhalers (please, bring the inhaler with you the day of surgery).    The day of surgery:                      Do not wear jewelry,             Do not wear lotions, powders, colognes, or deodorant.            Men may shave face and neck.            Do not bring valuables to the hospital.            [facility] is not responsible for any belongings or valuables.  Do NOT Smoke (Tobacco/Vaping) 24 hours prior to your procedure If you use a CPAP at night, you may bring  all equipment for your overnight stay.   Contacts, glasses, dentures or bridgework may not be worn into surgery.      For patients admitted to the hospital, discharge time will be determined by your treatment team.   Patients discharged the day of surgery will not be allowed to drive home, and someone needs to stay with them for 24 hours.    Special instructions:   [facility]- Preparing For Surgery  Before surgery, you can play an important role. Because skin is not sterile, your skin needs to be as free of germs as possible. You can reduce the number of germs on your skin by washing with CHG (chlorahexidine gluconate) Soap before surgery.  CHG is an antiseptic cleaner which kills germs and bonds with the skin to continue killing germs even after washing.    Oral Hygiene is also important to reduce your risk of infection.  Remember - BRUSH YOUR TEETH THE MORNING OF SURGERY WITH YOUR REGULAR TOOTHPASTE  Please do not use if you have an allergy to CHG or antibacterial soaps. If your skin becomes reddened/irritated stop using the CHG.  Do not shave (including legs and underarms) for at least 48 hours prior to first CHG shower. It is OK to shave your  face.  Please follow these instructions carefully.   Shower the NIGHT BEFORE SURGERY and the MORNING OF SURGERY with DIAL Soap.   Pat yourself dry with a CLEAN TOWEL.  Wear CLEAN PAJAMAS to bed the night before surgery  Place CLEAN SHEETS on your bed the night of your first shower and DO NOT SLEEP WITH PETS.   Day of Surgery: Please shower morning of surgery  Wear Clean/Comfortable clothing the morning of surgery Do not apply any deodorants/lotions.   Remember to brush your teeth WITH YOUR REGULAR TOOTHPASTE.   Questions were answered. Patient verbalized understanding of instructions.

## 2022-08-21 ENCOUNTER — Telehealth: Payer: Self-pay | Admitting: *Deleted

## 2022-08-21 NOTE — Progress Notes (Signed)
Anesthesia Chart Review: [name]  Case: [id number] Date/Time: 08/22/22 1200   Procedure: RIGHT REVERSE SHOULDER ARTHROPLASTY (Right: Shoulder)   Anesthesia type: Regional   Pre-op diagnosis: RIGHT GLENOHUMERAL OSTEOARTHRITIS   Location: [facility] OR ROOM 06 / [place] [facility]   Surgeons: Vanetta Mulders, MD       DISCUSSION: Patient is a 76 year old male scheduled for the above procedure. Surgery was initially scheduled for 07/19/22, but postponed due to need for preoperative cardiology evaluation due to being overdue for follow-up with frequent ectopy on EKG and 2021 echo showed EF 50-55% with mildly reduced RV systolic function. Since then he had in person cardiology office visit with Nicholes Rough, PA-C. EKG repeated and again showed frequent PVCs (known history). Denied CV symptoms and scored 5.62 METS on the DASI. She wrote, "[name]'s perioperative risk of a major cardiac event is 0.4% according to the Revised Cardiac Risk Index (RCRI).  Therefore, he is at low risk for perioperative complications.   His functional capacity is good at 5.62 METs according to the Duke Activity Status Index (DASI). Recommendations: According to ACC/AHA guidelines, no further cardiovascular testing needed.  The patient may proceed to surgery at acceptable risk."    Also given his history of chronic cough with recurrent COPD exacerbations in setting of prior RML/RLL lung resection for bronchiectasis/lung abscess, we also had requested medical clearance. Per April at Dr. Eddie Dibbles office, medical clearance was previously obtained from PCP by Dr. Charlett Blake prior to when surgery was scheduled last month.    History includes former smoker (quit 09/03/00), COPD Gold II D/emphysema, bronchiectasis/lung abscess (s/p RML/RLL lung resection 1996; recurrent hemoptysis, s/p bronchoscopy 07/12/20, non-diagnostic, + Staph, Acinetobacter, s/p 4+ weeks antibiotics to clear), HTN, dyspnea, GERD, atypical chest pain (2014 in setting of URI), PVCs,  spinal surgery (C3-7 ACDF 05/23/08; redo L1-2 decompressive laminectomy/L1-2 PLIF/pedicle screw fixation L1-5 06/28/09; L2-5 PLIF 09/12/08), sinus surgery (12/20/03), Upper GI bleed (10/2021, s/p 3 units PRBC, EGD 10/31/21, non-bleeding duodenal ulcer s/p bipolar cautery; NSAIDS discontinued, started on oral iron).     Of note, during 07/12/20 bronchoscopy for recurrent hemoptysis, CT surgeon Dr. Roxan Hockey commented, "Markedly distorted tracheal and right-sided airway anatomy, no endobronchial lesions.  Bronchial stump was well healed." He last had [facility] for EGD on 10/31/21. Gastroenterologists requested EGD be converted from [facility] to GETA due to significant GI bleed. RSI performed. Miller and 2 used to place 7.5 mm oral ETT, grade I view.    Anesthesia team to evaluate on the day of surgery. Per 07/03/22 note by Dr. Sammuel Hines, "He is not able to take NSAIDs as a result of a severe gastritis requiring intervention and as result we will plan to put him on Lovenox for 2 weeks postop".   VS:  BP Readings from Last 3 Encounters:  07/29/22 132/64  07/19/22 (!) 152/59  06/13/22 138/60   Pulse Readings from Last 3 Encounters:  07/29/22 84  07/19/22 (!) 50  06/13/22 (!) 46     PROVIDERS: Mosie Lukes, MD is PCP    Modesto Charon, MD is CT surgeon. Last visit 10/30/21. Hemoptysis from 2021 resolved after prolonged course of antibiotics.  Still with persistent cough.  Right cavitary lesion had resolved. 1 year CT surgeon follow-up with chest CT recommended. Of note at that visit he reported melena with symptomatic anemia and was referred to the ED.     Ruben Rouge, MD is cardiologist. Last visit 07/29/22 with Nicholes Rough, PA-C for preoperative evaluation. Previously had seen Dr. Johnsie Cancel  on 09/28/19.  Vernelle Emerald, MD is rheumatologist   Lucio Edward, MD is GI. As needed follow-up per 12/03/21 office visit.    Kara Mead, MD is pulmonologist. Last visit 05/22/20 with Eric Form, NP. He was  referred to CT surgeon for monitoring of RUL cystic region.     LABS: Labs on arrival as indicated. Last results in [facility] include: Lab Results  Component Value Date   WBC 7.5 07/19/2022   HGB 12.9 (L) 07/19/2022   HCT 41.0 07/19/2022   PLT 321 07/19/2022   GLUCOSE 128 (H) 07/19/2022   ALT 19 06/13/2022   AST 20 06/13/2022   NA 138 07/19/2022   K 4.2 07/19/2022   CL 106 07/19/2022   CREATININE 0.77 07/19/2022   BUN 24 (H) 07/19/2022   CO2 23 07/19/2022   TSH 0.64 06/13/2022   INR 1.1 10/30/2021   HGBA1C 6.4 06/13/2022    PFTs 07/06/18: FVC 2.74 (71%), post 2.79 (72%). FEV1 1.72 (61%), post 1.79 (63%). DLCO [facility] 15.04 (53%).     IMAGES: CT Right shoulder 07/09/22: IMPRESSION: 1. Severe osteoarthritis of the glenohumeral joint. Complete tear of the supraspinatus and infraspinatus tendons. Mild atrophy of the supraspinatus and infraspinatus muscles. Severe atrophy of the infraspinatus muscle. 2. Aortic Atherosclerosis (ICD10-I70.0) and Emphysema (ICD10-J43.9).   CT Head 05/22/22 ([facility]): IMPRESSION: 1. No acute intracranial abnormality. MRI is more sensitive for acute ischemia.  2. Findings compatible with mild sequela of chronic microvascular disease and age commensurate atrophy.  3. Small laceration and contusion along the anterolateral right frontal scalp. No acute fracture.    1V PCXR 10/30/21: FINDINGS: Cardiac silhouette and mediastinal contours are unchanged and within normal limits. There is again moderate right lung volume loss. Stable right apical thickening. Scarring within the right lung base unchanged. No new focal airspace opacity. No definite pleural effusion. No pneumothorax. ACDF hardware again overlies the cervical spine. High-riding humeral heads bilaterally suggesting full-thickness superior rotator cuff tears. Severe right greater than left glenohumeral osteoarthritis. IMPRESSION: 1. No significant change in chronic right lung volume loss  and scarring. 2. No acute lung process.   CT Chest 10/30/21: IMPRESSION: 1. Status post right upper and right middle lobectomies. 2. Stable appearance of pleural thickening overlying the apical portion of the right lung. The cavitary component within this area has resolved in the interval. 3. Stable appearance of pleuroparenchymal scarring within the right lung base with stable chronic areas of loculated pleural gas overlying the posterior left base. 4. Aortic Atherosclerosis (ICD10-I70.0) and Emphysema (ICD10-J43.9). Coronary artery calcifications.     EKG:  EKG 07/29/22: Per office note by Nicholes Rough, PA-C, "EKG is  ordered today.  The ekg ordered today demonstrates NSR rate 84 bom with frequent PVCs"  EKG 10/30/21:  Sinus tachycardia Multiple premature complexes, PVCs and PACs RSR prime in V1 or V2, right VCD or RVH     CV: TTE 09/29/19: 1. Left ventricular ejection fraction, by visual estimation, is 50 to  55%. The left ventricle has normal function. There is no left ventricular  hypertrophy.   2. The left ventricle has no regional wall motion abnormalities.   3. Abnormal septal motion Frequent ectopy during exam.   4. Global right ventricle has mildly reduced systolic function.The right  ventricular size is mildly enlarged. No increase in right ventricular wall  thickness.   5. Left atrial size was normal.   6. Right atrial size was mildly dilated.   7. Mild mitral annular calcification.   8. The  mitral valve is normal in structure. Trivial mitral valve  regurgitation. No evidence of mitral stenosis.   9. The tricuspid valve is normal in structure.  10. The tricuspid valve is normal in structure. Tricuspid valve  regurgitation is not demonstrated.  11. The aortic valve is tricuspid. Aortic valve regurgitation is not  visualized. No evidence of aortic valve sclerosis or stenosis.  12. The pulmonic valve was normal in structure. Pulmonic valve  regurgitation is not  visualized.  13. The inferior vena cava is normal in size with greater than 50%  respiratory variability, suggesting right atrial pressure of 3 mmHg.  - Per review by Dr. Johnsie Cancel, "Low normal EF valves ok overall good."; Comparison TTE 01/03/15 LVEF 55-60%, normal RVSF.      48 Hour Holter Monitor 08/03/14: NSR with PVCs. Short run of sustained VT.     US Carotid 07/24/11:  Very mild lack, bilaterally.  0 to 39% bilateral ICA stenosis.    Past Medical History:  Diagnosis Date   Allergic rhinitis    Atypical chest pain 12/01/2014   Back pain 01/12/2013   COPD (chronic obstructive pulmonary disease) (HCC)    FeV1 64%-2007   Dyspnea    Dysrhythmia    PVCs   Emphysema    GERD (gastroesophageal reflux disease)    History of lung abscess    bronchiectasis with RMLandRLL ersection -1996- Dr Arlyce Dice   Hordeolum externum (stye) 04/23/2015   Right eye   Hypertension    Impaired vision    glasses   Osteoarthritis    hands and knees   Pneumonia    Sinusitis, acute 04/23/2015    Past Surgical History:  Procedure Laterality Date   COLONOSCOPY  11/20/2010   Dr.Stark   ESOPHAGOGASTRODUODENOSCOPY (EGD) WITH PROPOFOL N/A 10/31/2021   Procedure: ESOPHAGOGASTRODUODENOSCOPY (EGD) WITH PROPOFOL;  Surgeon: Gatha Mayer, MD;  Location: [place];  Service: Gastroenterology;  Laterality: N/A;   HEMOSTASIS CONTROL  10/31/2021   Procedure: HEMOSTASIS CONTROL;  Surgeon: Gatha Mayer, MD;  Location: [place];  Service: Gastroenterology;;   HERNIA REPAIR  10/03/2009   HOT HEMOSTASIS N/A 10/31/2021   Procedure: HOT HEMOSTASIS (ARGON PLASMA COAGULATION/BICAP);  Surgeon: Gatha Mayer, MD;  Location: [place];  Service: Gastroenterology;  Laterality: N/A;   left knee     lower back surgery  09/02/2008   06/2009   LUNG SURGERY     RML andRUL removed due to bleeding and bronchiectasis and lung abcess   NECK SURGERY  05/03/2008   right foot surgery     SCLEROTHERAPY  10/31/2021    Procedure: SCLEROTHERAPY;  Surgeon: Gatha Mayer, MD;  Location: [facility] ENDOSCOPY;  Service: Gastroenterology;;   TONSILLECTOMY     removed as a child   VIDEO BRONCHOSCOPY N/A 07/12/2020   Procedure: VIDEO BRONCHOSCOPY;  Surgeon: [name], MD;  Location: [place];  Service: Thoracic;  Laterality: N/A;   VIDEO BRONCHOSCOPY WITH ENDOBRONCHIAL NAVIGATION N/A 07/12/2020   Procedure: VIDEO BRONCHOSCOPY WITH ENDOBRONCHIAL NAVIGATION;  Surgeon: [name], MD;  Location: [facility] OR;  Service: Thoracic;  Laterality: N/A;    MEDICATIONS: No current facility-administered medications for this encounter.    acetaminophen (TYLENOL) 500 MG tablet   albuterol (VENTOLIN HFA) 108 (90 Base) MCG/ACT inhaler   amLODipine (NORVASC) 5 MG tablet   benzonatate (TESSALON) 100 MG capsule   calcium elemental as carbonate (BARIATRIC TUMS ULTRA) 400 MG chewable tablet   cetirizine (ZYRTEC) 10 MG tablet   fluticasone (FLONASE) 50 MCG/ACT nasal  spray   Glucosamine-Chondroitin 750-600 MG TABS   guaiFENesin (MUCINEX) 600 MG 12 hr tablet   loratadine (CLARITIN) 10 MG tablet   Menthol, Topical Analgesic, (BLUE-EMU MAXIMUM STRENGTH EX)   montelukast (SINGULAIR) 10 MG tablet   Multiple Vitamin (MULTIVITAMIN WITH MINERALS) TABS tablet   oxycodone (OXY-IR) 5 MG capsule   pantoprazole (PROTONIX) 20 MG tablet   TRELEGY ELLIPTA 100-62.5-25 MCG/ACT AEPB   triamcinolone cream (KENALOG) 0.1 %   amLODipine (NORVASC) 5 MG tablet   COVID-19 mRNA vaccine 2023-2024 (COMIRNATY) SUSP injection   doxycycline (VIBRA-TABS) 100 MG tablet   enoxaparin (LOVENOX) 40 MG/0.4ML injection    Myra Gianotti, PA-C Surgical Short Stay/Anesthesiology [facility] Phone [phone] [facility] Phone [phone] 08/21/2022 12:47 PM

## 2022-08-21 NOTE — Telephone Encounter (Signed)
Ortho bundle pre-op call completed. 

## 2022-08-21 NOTE — Anesthesia Preprocedure Evaluation (Addendum)
Anesthesia Evaluation  Patient identified by MRN, date of birth, ID band Patient awake    Reviewed: Allergy & Precautions, H&P , NPO status , Patient's Chart, lab work & pertinent test results  Airway Mallampati: II  TM Distance: >3 FB Neck ROM: Full    Dental no notable dental hx. (+) Edentulous Upper, Edentulous Lower, Dental Advisory Given   Pulmonary COPD,  COPD inhaler, former smoker   Pulmonary exam normal breath sounds clear to auscultation       Cardiovascular hypertension, Pt. on medications  Rhythm:Regular Rate:Normal     Neuro/Psych negative neurological ROS  negative psych ROS   GI/Hepatic Neg liver ROS, PUD,GERD  Medicated,,  Endo/Other  negative endocrine ROS    Renal/GU negative Renal ROS  negative genitourinary   Musculoskeletal  (+) Arthritis , Osteoarthritis,    Abdominal   Peds  Hematology  (+) Blood dyscrasia, anemia   Anesthesia Other Findings   Reproductive/Obstetrics negative OB ROS                             Anesthesia Physical Anesthesia Plan  ASA: 3  Anesthesia Plan: General   Post-op Pain Management: Regional block* and Tylenol PO (pre-op)*   Induction: Intravenous  PONV Risk Score and Plan: 3 and Ondansetron, Dexamethasone and Treatment may vary due to age or medical condition  Airway Management Planned: Oral ETT  Additional Equipment:   Intra-op Plan:   Post-operative Plan: Extubation in OR  Informed Consent: I have reviewed the patients History and Physical, chart, labs and discussed the procedure including the risks, benefits and alternatives for the proposed anesthesia with the patient or authorized representative who has indicated his/her understanding and acceptance.     Dental advisory given  Plan Discussed with: CRNA  Anesthesia Plan Comments: (PAT note written 08/21/2022 by Myra Gianotti, PA-C.  )       Anesthesia Quick  Evaluation

## 2022-08-21 NOTE — Care Plan (Signed)
[facility] RNCM call to patient to discuss his upcoming Right Reverse Shoulder Arthroplasty with Dr. Sammuel Hines on 08/22/22. He is an Ortho bundle patient through [facility] and is agreeable to case management. He has a spouse, who will be assisting after discharge. He has DME from [facility] delivered prior to surgery. He has requested OPPT to be at [facility] in [place]. This is scheduled for 08/27/22 at 10:45 am. Patient is aware. Reviewed post op care instructions. Will continue to follow for needs. SANE (Single Assessment Numeric Evaluation) tool used and patient verbalized 30% for this assessment. Will review again at 3 months post op.

## 2022-08-22 ENCOUNTER — Ambulatory Visit (HOSPITAL_BASED_OUTPATIENT_CLINIC_OR_DEPARTMENT_OTHER): Payer: Medicare HMO | Admitting: Physician Assistant

## 2022-08-22 ENCOUNTER — Ambulatory Visit (HOSPITAL_COMMUNITY): Payer: Medicare HMO

## 2022-08-22 ENCOUNTER — Ambulatory Visit (HOSPITAL_COMMUNITY)
Admission: RE | Admit: 2022-08-22 | Discharge: 2022-08-22 | Disposition: A | Discharge status: Home / Self Care | Payer: Medicare HMO | Attending: Orthopaedic Surgery | Admitting: Orthopaedic Surgery

## 2022-08-22 ENCOUNTER — Other Ambulatory Visit: Payer: Self-pay

## 2022-08-22 ENCOUNTER — Encounter (HOSPITAL_COMMUNITY)
Admission: RE | Disposition: A | Discharge status: Home / Self Care | Payer: Self-pay | Source: Home / Self Care | Attending: Orthopaedic Surgery

## 2022-08-22 ENCOUNTER — Ambulatory Visit (HOSPITAL_COMMUNITY): Payer: Medicare HMO | Admitting: Physician Assistant

## 2022-08-22 ENCOUNTER — Encounter (HOSPITAL_COMMUNITY): Payer: Self-pay | Admitting: Orthopaedic Surgery

## 2022-08-22 DIAGNOSIS — M129 Arthropathy, unspecified: Secondary | ICD-10-CM | POA: Diagnosis present

## 2022-08-22 DIAGNOSIS — Z87891 Personal history of nicotine dependence: Secondary | ICD-10-CM | POA: Diagnosis not present

## 2022-08-22 DIAGNOSIS — I10 Essential (primary) hypertension: Secondary | ICD-10-CM | POA: Diagnosis not present

## 2022-08-22 DIAGNOSIS — J449 Chronic obstructive pulmonary disease, unspecified: Secondary | ICD-10-CM | POA: Diagnosis not present

## 2022-08-22 DIAGNOSIS — Z96611 Presence of right artificial shoulder joint: Secondary | ICD-10-CM | POA: Diagnosis not present

## 2022-08-22 DIAGNOSIS — M19011 Primary osteoarthritis, right shoulder: Secondary | ICD-10-CM | POA: Insufficient documentation

## 2022-08-22 DIAGNOSIS — J439 Emphysema, unspecified: Secondary | ICD-10-CM | POA: Insufficient documentation

## 2022-08-22 DIAGNOSIS — Z8711 Personal history of peptic ulcer disease: Secondary | ICD-10-CM | POA: Insufficient documentation

## 2022-08-22 DIAGNOSIS — Z471 Aftercare following joint replacement surgery: Secondary | ICD-10-CM | POA: Diagnosis not present

## 2022-08-22 DIAGNOSIS — M12811 Other specific arthropathies, not elsewhere classified, right shoulder: Secondary | ICD-10-CM | POA: Diagnosis not present

## 2022-08-22 DIAGNOSIS — K219 Gastro-esophageal reflux disease without esophagitis: Secondary | ICD-10-CM | POA: Diagnosis not present

## 2022-08-22 DIAGNOSIS — Z79899 Other long term (current) drug therapy: Secondary | ICD-10-CM | POA: Insufficient documentation

## 2022-08-22 DIAGNOSIS — G8918 Other acute postprocedural pain: Secondary | ICD-10-CM | POA: Diagnosis not present

## 2022-08-22 HISTORY — PX: REVERSE SHOULDER ARTHROPLASTY: SHX5054

## 2022-08-22 LAB — BASIC METABOLIC PANEL
Anion gap: 10 (ref 5–15)
BUN: 18 mg/dL (ref 8–23)
CO2: 21 mmol/L — ABNORMAL LOW (ref 22–32)
Calcium: 9.1 mg/dL (ref 8.9–10.3)
Chloride: 105 mmol/L (ref 98–111)
Creatinine, Ser: 0.69 mg/dL (ref 0.61–1.24)
GFR, Estimated: >60 mL/min (ref >60)
Glucose, Bld: 79 mg/dL (ref 70–99)
Potassium: 3.7 mmol/L (ref 3.5–5.1)
Sodium: 136 mmol/L (ref 135–145)

## 2022-08-22 LAB — CBC
HCT: 40.8 % (ref 39.0–52.0)
Hemoglobin: 13.2 g/dL (ref 13.0–17.0)
MCH: 29.2 pg (ref 26.0–34.0)
MCHC: 32.4 g/dL (ref 30.0–36.0)
MCV: 90.3 fL (ref 80.0–100.0)
Platelets: 304 10*3/uL (ref 150–400)
RBC: 4.52 MIL/uL (ref 4.22–5.81)
RDW: 14.8 % (ref 11.5–15.5)
WBC: 5.9 10*3/uL (ref 4.0–10.5)
nRBC: 0 % (ref 0.0–0.2)

## 2022-08-22 SURGERY — ARTHROPLASTY, SHOULDER, TOTAL, REVERSE
Anesthesia: General | Site: Shoulder | Laterality: Right

## 2022-08-22 MED ORDER — BUPIVACAINE-EPINEPHRINE (PF) 0.5% -1:200000 IJ SOLN
INTRAMUSCULAR | Status: DC | PRN
  Administered 2022-08-22: 15 mL via PERINEURAL

## 2022-08-22 MED ORDER — FENTANYL CITRATE (PF) 100 MCG/2ML IJ SOLN
INTRAMUSCULAR | Status: AC
  Filled 2022-08-22: qty 2

## 2022-08-22 MED ORDER — PROPOFOL 10 MG/ML IV BOLUS
INTRAVENOUS | Status: DC | PRN
  Administered 2022-08-22: 120 mg via INTRAVENOUS

## 2022-08-22 MED ORDER — BUPIVACAINE-EPINEPHRINE (PF) 0.5% -1:200000 IJ SOLN: INTRAMUSCULAR | Status: DC | PRN

## 2022-08-22 MED ORDER — LACTATED RINGERS IV SOLN: INTRAVENOUS | Status: DC

## 2022-08-22 MED ORDER — PROPOFOL 10 MG/ML IV BOLUS
INTRAVENOUS | Status: AC
  Filled 2022-08-22: qty 20

## 2022-08-22 MED ORDER — OXYCODONE HCL 5 MG PO TABS: 5.0000 mg | ORAL_TABLET | ORAL | 0 refills | Status: DC | PRN

## 2022-08-22 MED ORDER — SUGAMMADEX SODIUM 200 MG/2ML IV SOLN
INTRAVENOUS | Status: DC | PRN
  Administered 2022-08-22: 120 mg via INTRAVENOUS

## 2022-08-22 MED ORDER — ACETAMINOPHEN 10 MG/ML IV SOLN
INTRAVENOUS | Status: DC | PRN
  Administered 2022-08-22: 1000 mg via INTRAVENOUS

## 2022-08-22 MED ORDER — 0.9 % SODIUM CHLORIDE (POUR BTL) OPTIME
TOPICAL | Status: DC | PRN
  Administered 2022-08-22: 1000 mL

## 2022-08-22 MED ORDER — ACETAMINOPHEN 500 MG PO TABS: 500.0000 mg | ORAL_TABLET | Freq: Three times a day (TID) | ORAL | 0 refills | Status: AC

## 2022-08-22 MED ORDER — BUPIVACAINE LIPOSOME 1.3 % IJ SUSP
INTRAMUSCULAR | Status: DC | PRN
  Administered 2022-08-22: 10 mL via PERINEURAL

## 2022-08-22 MED ORDER — FENTANYL CITRATE (PF) 250 MCG/5ML IJ SOLN
INTRAMUSCULAR | Status: DC | PRN
  Administered 2022-08-22: 50 ug via INTRAVENOUS

## 2022-08-22 MED ORDER — FENTANYL CITRATE (PF) 250 MCG/5ML IJ SOLN
INTRAMUSCULAR | Status: AC
  Filled 2022-08-22: qty 5

## 2022-08-22 MED ORDER — ACETAMINOPHEN 500 MG PO TABS
1000.0000 mg | ORAL_TABLET | Freq: Once | ORAL | Status: DC
  Filled 2022-08-22: qty 2

## 2022-08-22 MED ORDER — CHLORHEXIDINE GLUCONATE 0.12 % MT SOLN
15.0000 mL | OROMUCOSAL | Status: AC
  Administered 2022-08-22: 15 mL via OROMUCOSAL
  Filled 2022-08-22: qty 15

## 2022-08-22 MED ORDER — VANCOMYCIN HCL 1000 MG IV SOLR
INTRAVENOUS | Status: AC
  Filled 2022-08-22: qty 20

## 2022-08-22 MED ORDER — FENTANYL CITRATE PF 50 MCG/ML IJ SOSY
50.0000 ug | PREFILLED_SYRINGE | Freq: Once | INTRAMUSCULAR | Status: AC
  Administered 2022-08-22: 50 ug via INTRAVENOUS
  Filled 2022-08-22: qty 1

## 2022-08-22 MED ORDER — MIDAZOLAM HCL 2 MG/2ML IJ SOLN
INTRAMUSCULAR | Status: AC
  Filled 2022-08-22: qty 2

## 2022-08-22 MED ORDER — LIDOCAINE 2% (20 MG/ML) 5 ML SYRINGE
INTRAMUSCULAR | Status: DC | PRN
  Administered 2022-08-22: 60 mg via INTRAVENOUS

## 2022-08-22 MED ORDER — CEFAZOLIN SODIUM-DEXTROSE 2-4 GM/100ML-% IV SOLN
2.0000 g | INTRAVENOUS | Status: AC
  Administered 2022-08-22: 2 g via INTRAVENOUS
  Filled 2022-08-22: qty 100

## 2022-08-22 MED ORDER — ONDANSETRON HCL 4 MG/2ML IJ SOLN
INTRAMUSCULAR | Status: DC | PRN
  Administered 2022-08-22: 4 mg via INTRAVENOUS

## 2022-08-22 MED ORDER — PHENYLEPHRINE HCL-NACL 20-0.9 MG/250ML-% IV SOLN
INTRAVENOUS | Status: DC | PRN
  Administered 2022-08-22: 50 ug/min via INTRAVENOUS

## 2022-08-22 MED ORDER — SODIUM CHLORIDE 0.9 % IR SOLN
Status: DC | PRN
  Administered 2022-08-22: 1000 mL

## 2022-08-22 MED ORDER — POVIDONE-IODINE 10 % EX SOLN
CUTANEOUS | Status: DC | PRN
  Administered 2022-08-22: 30 via TOPICAL

## 2022-08-22 MED ORDER — ROCURONIUM BROMIDE 10 MG/ML (PF) SYRINGE
PREFILLED_SYRINGE | INTRAVENOUS | Status: DC | PRN
  Administered 2022-08-22: 50 mg via INTRAVENOUS
  Administered 2022-08-22: 30 mg via INTRAVENOUS

## 2022-08-22 MED ORDER — ACETAMINOPHEN 10 MG/ML IV SOLN
INTRAVENOUS | Status: AC
  Filled 2022-08-22: qty 100

## 2022-08-22 MED ORDER — VANCOMYCIN HCL 1000 MG IV SOLR
INTRAVENOUS | Status: DC | PRN
  Administered 2022-08-22: 1000 mg via TOPICAL

## 2022-08-22 MED ORDER — DEXAMETHASONE SODIUM PHOSPHATE 10 MG/ML IJ SOLN
INTRAMUSCULAR | Status: DC | PRN
  Administered 2022-08-22: 5 mg via INTRAVENOUS

## 2022-08-22 MED ORDER — GABAPENTIN 300 MG PO CAPS
300.0000 mg | ORAL_CAPSULE | Freq: Once | ORAL | Status: AC
  Administered 2022-08-22: 300 mg via ORAL
  Filled 2022-08-22: qty 1

## 2022-08-22 MED ORDER — TRANEXAMIC ACID-NACL 1000-0.7 MG/100ML-% IV SOLN
1000.0000 mg | INTRAVENOUS | Status: AC
  Administered 2022-08-22: 1000 mg via INTRAVENOUS
  Filled 2022-08-22: qty 100

## 2022-08-22 MED ORDER — IRRISEPT - 450ML BOTTLE WITH 0.05% CHG IN STERILE WATER, USP 99.95% OPTIME
TOPICAL | Status: DC | PRN
  Administered 2022-08-22: 450 mL

## 2022-08-22 MED ORDER — PHENYLEPHRINE 80 MCG/ML (10ML) SYRINGE FOR IV PUSH (FOR BLOOD PRESSURE SUPPORT)
PREFILLED_SYRINGE | INTRAVENOUS | Status: DC | PRN
  Administered 2022-08-22: 80 ug via INTRAVENOUS
  Administered 2022-08-22: 160 ug via INTRAVENOUS

## 2022-08-22 SURGICAL SUPPLY — 75 items
ADH SKN CLS APL DERMABOND .7 (GAUZE/BANDAGES/DRESSINGS) ×1
AID PSTN UNV HD RSTRNT DISP (MISCELLANEOUS) ×1
APL PRP STRL LF DISP 70% ISPRP (MISCELLANEOUS) ×1
BAG COUNTER SPONGE SURGICOUNT (BAG) ×1 IMPLANT
BAG SPNG CNTER NS LX DISP (BAG) ×1
BASEPLATE GLENOID STD REV 42 (Joint) IMPLANT
BASEPLATE SHOULDER FW 15D 29 (Joint) IMPLANT
BIT DRILL 3.2 PERIPHERAL SCREW (BIT) IMPLANT
BLADE SAW SAG 29X58X.64 (BLADE) IMPLANT
BLADE SAW SGTL 73X25 THK (BLADE) IMPLANT
BSPLAT GLND 15D 29 FULL WDG (Joint) ×1 IMPLANT
CHLORAPREP W/TINT 26 (MISCELLANEOUS) ×1 IMPLANT
CLSR STERI-STRIP ANTIMIC 1/2X4 (GAUZE/BANDAGES/DRESSINGS) IMPLANT
COOLER ICEMAN CLASSIC (MISCELLANEOUS) ×1 IMPLANT
COVER SURGICAL LIGHT HANDLE (MISCELLANEOUS) ×1 IMPLANT
CUP HUM REV SHLD 3/4 42 +0 (Cup) IMPLANT
DERMABOND ADVANCED .7 DNX12 (GAUZE/BANDAGES/DRESSINGS) IMPLANT
DRAPE IMP U-DRAPE 54X76 (DRAPES) ×1 IMPLANT
DRAPE INCISE IOBAN 66X45 STRL (DRAPES) ×1 IMPLANT
DRAPE U-SHAPE 47X51 STRL (DRAPES) ×2 IMPLANT
DRSG AQUACEL AG ADV 3.5X10 (GAUZE/BANDAGES/DRESSINGS) ×1 IMPLANT
DRSG TEGADERM 4X10 (GAUZE/BANDAGES/DRESSINGS) IMPLANT
ELECT BLADE 4.0 EZ CLEAN MEGAD (MISCELLANEOUS) ×1
ELECT REM PT RETURN 9FT ADLT (ELECTROSURGICAL) ×1
ELECTRODE BLDE 4.0 EZ CLN MEGD (MISCELLANEOUS) ×1 IMPLANT
ELECTRODE REM PT RTRN 9FT ADLT (ELECTROSURGICAL) ×1 IMPLANT
GLOVE BIO SURGEON STRL SZ7.5 (GLOVE) ×3 IMPLANT
GLOVE BIOGEL PI IND STRL 6.5 (GLOVE) ×1 IMPLANT
GLOVE BIOGEL PI IND STRL 8 (GLOVE) ×2 IMPLANT
GLOVE ECLIPSE 6.0 STRL STRAW (GLOVE) ×1 IMPLANT
GLOVE INDICATOR 8.0 STRL GRN (GLOVE) ×1 IMPLANT
GOWN STRL REUS W/ TWL LRG LVL3 (GOWN DISPOSABLE) ×2 IMPLANT
GOWN STRL REUS W/TWL LRG LVL3 (GOWN DISPOSABLE) ×2
GUIDE PIN 3X75 SHOULDER (PIN) ×2
GUIDEWIRE GLENOID 2.5X220 (WIRE) IMPLANT
HANDPIECE INTERPULSE COAX TIP (DISPOSABLE) ×1
KIT BASIN OR (CUSTOM PROCEDURE TRAY) ×1 IMPLANT
KIT STABILIZATION SHOULDER (MISCELLANEOUS) ×1 IMPLANT
KIT TURNOVER KIT B (KITS) ×1 IMPLANT
MANIFOLD NEPTUNE II (INSTRUMENTS) ×1 IMPLANT
NDL HYPO 21X1 ECLIPSE (NEEDLE) ×1 IMPLANT
NDL MAYO TROCAR (NEEDLE) ×1 IMPLANT
NEEDLE HYPO 21X1 ECLIPSE (NEEDLE) ×1 IMPLANT
NEEDLE MAYO TROCAR (NEEDLE) ×1 IMPLANT
NS IRRIG 1000ML POUR BTL (IV SOLUTION) ×1 IMPLANT
PACK SHOULDER (CUSTOM PROCEDURE TRAY) ×1 IMPLANT
PACK UNIVERSAL I (CUSTOM PROCEDURE TRAY) ×1 IMPLANT
PAD ARMBOARD 7.5X6 YLW CONV (MISCELLANEOUS) ×2 IMPLANT
PAD COLD SHLDR WRAP-ON (PAD) ×1 IMPLANT
PIN GUIDE 3X75 SHOULDER (PIN) IMPLANT
RESTRAINT HEAD UNIVERSAL NS (MISCELLANEOUS) ×1 IMPLANT
SCREW 5.5X26 (Screw) IMPLANT
SCREW BONE INTRNL SM 7 (Screw) IMPLANT
SCREW PERIPHERAL 30 (Screw) IMPLANT
SET HNDPC FAN SPRY TIP SCT (DISPOSABLE) ×1 IMPLANT
SLING ARM IMMOBILIZER LRG (SOFTGOODS) ×1 IMPLANT
SPONGE T-LAP 18X18 ~~LOC~~+RFID (SPONGE) ×1 IMPLANT
STAPLER VISISTAT 35W (STAPLE) ×1 IMPLANT
STEM HUMERAL STD SHORT SZ3 (Joint) IMPLANT
SUCTION FRAZIER HANDLE 10FR (MISCELLANEOUS) ×1
SUCTION TUBE FRAZIER 10FR DISP (MISCELLANEOUS) ×1 IMPLANT
SUT FIBERWIRE #5 38 CONV NDL (SUTURE)
SUT MNCRL+ AB 3-0 CT1 36 (SUTURE) IMPLANT
SUT MONOCRYL AB 3-0 CT1 36IN (SUTURE) ×1
SUT VIC AB 0 CT1 27 (SUTURE)
SUT VIC AB 0 CT1 27XBRD ANBCTR (SUTURE) ×2 IMPLANT
SUT VIC AB 2-0 CT1 27 (SUTURE)
SUT VIC AB 2-0 CT1 TAPERPNT 27 (SUTURE) ×2 IMPLANT
SUTURE FIBERWR #5 38 CONV NDL (SUTURE) ×2 IMPLANT
SYR 50ML LL SCALE MARK (SYRINGE) ×1 IMPLANT
TAPE LABRALWHITE 1.5X36 (TAPE) IMPLANT
TAPE SUT LABRALTAP WHT/BLK (SUTURE) IMPLANT
TOWEL GREEN STERILE (TOWEL DISPOSABLE) ×1 IMPLANT
TRAY FOLEY MTR SLVR 16FR STAT (SET/KITS/TRAYS/PACK) ×1 IMPLANT
WATER STERILE IRR 1000ML POUR (IV SOLUTION) ×1 IMPLANT

## 2022-08-22 NOTE — H&P (Signed)
Chief Complaint: Follow-up for right shoulder        History of Present Illness:      [name] is a 76 y.o. male presents today as a referral from my partner Dr. Erlinda Hong for discussion of his right shoulder pain.  He has had this now for several years.  He is quite limited with any type of overhead activity.  He has pain in the shoulder with most activities including fishing which he enjoys doing.  He does have a history of gastritis for which she is unable to take anti-inflammatories.  He did have an ultrasound-guided injection previously with very limited relief.  He states today seeking further additional treatment.       Surgical History:   None   PMH/PSH/Family History/Social History/Meds/Allergies:         Past Medical History:  Diagnosis Date   Allergic rhinitis     Atypical chest pain 12/01/2014   Back pain 01/12/2013   COPD (chronic obstructive pulmonary disease) (HCC)      FeV1 64%-2007   Dyspnea     Emphysema     GERD (gastroesophageal reflux disease)     History of lung abscess      bronchiectasis with RMLandRLL ersection -1996- Dr Arlyce Dice   Hordeolum externum (stye) 04/23/2015    Right eye   Hypertension     Impaired vision      glasses   Osteoarthritis      hands and knees   Sinusitis, acute 04/23/2015         Past Surgical History:  Procedure Laterality Date   COLONOSCOPY   11/20/2010    Dr.Stark   ESOPHAGOGASTRODUODENOSCOPY (EGD) WITH PROPOFOL N/A 10/31/2021    Procedure: ESOPHAGOGASTRODUODENOSCOPY (EGD) WITH PROPOFOL;  Surgeon: Gatha Mayer, MD;  Location: [place];  Service: Gastroenterology;  Laterality: N/A;   HEMOSTASIS CONTROL   10/31/2021    Procedure: HEMOSTASIS CONTROL;  Surgeon: Gatha Mayer, MD;  Location: [place];  Service: Gastroenterology;;   HERNIA REPAIR   10/03/2009   HOT HEMOSTASIS N/A 10/31/2021    Procedure: HOT HEMOSTASIS (ARGON PLASMA COAGULATION/BICAP);  Surgeon: Gatha Mayer, MD;   Location: [place];  Service: Gastroenterology;  Laterality: N/A;   left knee       lower back surgery   09/02/2008    06/2009   LUNG SURGERY        RML andRUL removed due to bleeding and bronchiectasis and lung abcess   NECK SURGERY   05/03/2008   right foot surgery       SCLEROTHERAPY   10/31/2021    Procedure: SCLEROTHERAPY;  Surgeon: Gatha Mayer, MD;  Location: [place];  Service: Gastroenterology;;   VIDEO BRONCHOSCOPY N/A 07/12/2020    Procedure: VIDEO BRONCHOSCOPY;  Surgeon: [name], MD;  Location: [place];  Service: Thoracic;  Laterality: N/A;   VIDEO BRONCHOSCOPY WITH ENDOBRONCHIAL NAVIGATION N/A 07/12/2020    Procedure: VIDEO BRONCHOSCOPY WITH ENDOBRONCHIAL NAVIGATION;  Surgeon: [name], MD;  Location: [facility];  Service: Thoracic;  Laterality: N/A;    Social History         Socioeconomic History   Marital status: Married      Spouse name: Not on file   Number of children: Not on file   Years  of education: Not on file   Highest education level: Not on file  Occupational History   Occupation: retired Music therapist  Tobacco Use   Smoking status: Former      Packs/day: 1.50      Years: 38.00      Total pack years: 57.00      Types: Cigarettes      Quit date: 09/03/2000      Years since quitting: 21.8      Passive exposure: Past   Smokeless tobacco: Never  Vaping Use   Vaping Use: Never used  Substance and Sexual Activity   Alcohol use: Yes      Alcohol/week: 1.0 standard drink of alcohol      Types: 1 Cans of beer per week      Comment: 3 or 4 nights weekly   Drug use: No   Sexual activity: Never  Other Topics Concern   Not on file  Social History Narrative    Retired Music therapist    Patient states former smoker. 1 1/2 ppd x 38 yrs  Quit in Jan 2002    Married - 2 weeks (4th marriage)    divorced,  remarried 84-2004 (lost wife to lung ca),  remarried (divorced),     1 son  - [name])    Alcohol use-yes (2-3  beers per week)                Social Determinants of Health        Financial Resource Strain: Low Risk  (11/01/2021)    Overall Financial Resource Strain (CARDIA)     Difficulty of Paying Living Expenses: Not very hard  Food Insecurity: No Food Insecurity (02/28/2021)    Hunger Vital Sign     Worried About Running Out of Food in the Last Year: Never true     Ran Out of Food in the Last Year: Never true  Transportation Needs: No Transportation Needs (02/28/2021)    PRAPARE - Armed forces logistics/support/administrative officer (Medical): No     Lack of Transportation (Non-Medical): No  Physical Activity: Sufficiently Active (02/28/2021)    Exercise Vital Sign     Days of Exercise per Week: 6 days     Minutes of Exercise per Session: 30 min  Stress: No Stress Concern Present (02/28/2021)    [place]     Feeling of Stress : Not at all  Social Connections: Moderately Integrated (02/28/2021)    Social Connection and Isolation Panel [NHANES]     Frequency of Communication with Friends and Family: More than three times a week     Frequency of Social Gatherings with Friends and Family: More than three times a week     Attends Religious Services: More than 4 times per year     Active Member of [employer] or Organizations: No     Attends Archivist Meetings: Never     Marital Status: Married         Family History  Problem Relation Age of Onset   Heart failure Mother          age 37   Prostate cancer Father          father died prostate ca   Obstructive Sleep Apnea Brother     Obesity Brother     Colon cancer Paternal Grandmother     Healthy Son     Coronary artery  disease Other          1st degree relative<60   Stroke Other          1st degree relative<50   Esophageal cancer Neg Hx     Stomach cancer Neg Hx           Allergies  Allergen Reactions   Losartan Other (See Comments)      weakness   Meloxicam             Current Outpatient Medications  Medication Sig Dispense Refill   acetaminophen (TYLENOL) 500 MG tablet Take 1 tablet (500 mg total) by mouth every 8 (eight) hours for 10 days. 30 tablet 0   enoxaparin (LOVENOX) 40 MG/0.4ML injection Inject 0.4 mLs (40 mg total) into the skin daily for 14 days. 5.6 mL 0   oxycodone (OXY-IR) 5 MG capsule Take 1 capsule (5 mg total) by mouth every 4 (four) hours as needed (severe pain). 20 capsule 0   acetaminophen (TYLENOL) 500 MG tablet Take 2 tablets (1,000 mg total) by mouth every 8 (eight) hours as needed for moderate pain. 100 tablet 0   albuterol (VENTOLIN HFA) 108 (90 Base) MCG/ACT inhaler INHALE 2 PUFFS INTO THE LUNGS EVERY 6 HOURS AS NEEDED FOR WHEEZING OR SHORTNESS OF BREATH. 2 each 10   amLODipine (NORVASC) 5 MG tablet Take 1 tablet (5 mg total) by mouth daily. 90 tablet 1   amLODipine (NORVASC) 5 MG tablet Take 1 & 1/2 tablet (7.5 mg total) by mouth daily. 135 tablet 1   amoxicillin (AMOXIL) 500 MG capsule Take 1 capsule (500 mg total) by mouth 3 (three) times daily for 10 days. 30 capsule 0   benzonatate (TESSALON) 100 MG capsule TAKE 1 CAPSULE BY MOUTH TWICE DAILY AS NEEDED FOR COUGH 40 capsule 0   calcium elemental as carbonate (BARIATRIC TUMS ULTRA) 400 MG chewable tablet Chew 1,000 mg by mouth 2 (two) times daily.        cetirizine (ZYRTEC) 10 MG tablet Take 10 mg by mouth daily.       COVID-19 mRNA vaccine 2023-2024 (COMIRNATY) SUSP injection Inject into the muscle. 0.3 mL 0   ferrous sulfate 325 (65 FE) MG tablet Take 1 tablet (325 mg total) by mouth daily. 30 tablet 0   fluticasone (FLONASE) 50 MCG/ACT nasal spray USE 2 SPRAYS IN EACH NOSTRIL EVERY DAY 48 g 10   Glucosamine-Chondroitin 750-600 MG TABS Take 1 tablet by mouth 2 (two) times daily.       Guaifenesin 1200 MG TB12 Take 600 mg by mouth 2 (two) times daily.       loratadine (CLARITIN) 10 MG tablet Take 10 mg by mouth daily.       Menthol, Topical Analgesic, (BLUE-EMU MAXIMUM STRENGTH  EX) Apply 1 application topically daily as needed (arthritis pain).        Multiple Vitamin (MULTIVITAMIN WITH MINERALS) TABS tablet Take 1 tablet by mouth daily.        pantoprazole (PROTONIX) 20 MG tablet Take 1 tablet (20 mg total) by mouth daily. 30 tablet 11   TRELEGY ELLIPTA 100-62.5-25 MCG/ACT AEPB INHALE 1 PUFF INTO THE LUNGS DAILY 180 each 1   vitamin B-12 (CYANOCOBALAMIN) 1000 MCG tablet Take 1 tablet (1,000 mcg total) by mouth daily. 30 tablet 0    No current facility-administered medications for this visit.    Imaging Results (Last 48 hours)  No results found.     Review of Systems:   A ROS was performed  including pertinent positives and negatives as documented in the HPI.   Physical Exam :   Constitutional: NAD and appears stated age Neurological: Alert and oriented Psych: Appropriate affect and cooperative There were no vitals taken for this visit.    Comprehensive Musculoskeletal Exam:     Musculoskeletal Exam      Inspection Right Left  Skin No atrophy or winging No atrophy or winging  Palpation      Tenderness Glenohumeral, acromion None  Range of Motion      Flexion (passive) 90 170  Flexion (active) 90 170  Abduction 80 170  ER at the side 20 70  Can reach behind back to Back pocket L5  Strength        4-5 Full  Special Tests      Pseudoparalytic No No  Neurologic      Fires PIN, radial, median, ulnar, musculocutaneous, axillary, suprascapular, long thoracic, and spinal accessory innervated muscles. No abnormal sensibility  Vascular/Lymphatic      Radial Pulse 2+ 2+  Cervical Exam      Patient has symmetric cervical range of motion with negative Spurling's test.  Special Test:         Imaging:   Xray (3 views right shoulder): Severe glenohumeral osteoarthritis as well as rotator cuff arthropathy with acetabular rotation of the acromion       I personally reviewed and interpreted the radiographs.     Assessment:   76 y.o. male  left-hand-dominant male with severe right shoulder rotator cuff arthropathy which is limiting him with most overhead activities.  Given the fact that he has failed now an injection as well as activity modification and is having pain most of the time I do believe he would be a candidate for reverse shoulder arthroplasty.  I discussed with him the specific risks and benefits associated with this.  At this point I do not believe that physical therapy would significantly improve his pain as he does have acetabularizaition of the acromion which I do believe is likely a large indicator of his pain.  He is not able to take NSAIDs as a result of a severe gastritis requiring intervention and as result we will plan to put him on Lovenox for 2 weeks postop   Plan :     -Plan for right shoulder reverse shoulder arthroplasty     After a lengthy discussion of treatment options, including risks, benefits, alternatives, complications of surgical and nonsurgical conservative options, the patient elected surgical repair.    The patient  is aware of the material risks  and complications including, but not limited to injury to adjacent structures, neurovascular injury, infection, numbness, bleeding, implant failure, thermal burns, stiffness, persistent pain, failure to heal, disease transmission from allograft, need for further surgery, dislocation, anesthetic risks, blood clots, risks of death,and others. The probabilities of surgical success and failure discussed with patient given their particular co-morbidities.The time and nature of expected rehabilitation and recovery was discussed.The patient's questions were all answered preoperatively.  No barriers to understanding were noted. I explained the natural history of the disease process and Rx rationale.  I explained to the patient what I considered to be reasonable expectations given their personal situation.  The final treatment plan was arrived at through a shared  patient decision making process model.             I personally saw and evaluated the patient, and participated in the management and treatment plan.   Vanetta Mulders,  MD Attending Physician, Orthopedic Surgery

## 2022-08-22 NOTE — Discharge Instructions (Signed)
     Discharge Instructions    Attending Surgeon: Zanai Mallari, MD Office Phone Number: [phone]   Diagnosis and Procedures:    Surgeries Performed: Right shoulder reverse shoulder arthroplasty  Discharge Plan:    Diet: Resume usual diet. Begin with light or bland foods.  Drink plenty of fluids.  Activity:  Keep sling and dressing in place until your follow up visit in Physical Therapy You are advised to go home directly from the hospital or surgical center. Restrict your activities.  GENERAL INSTRUCTIONS: 1.  Keep your surgical site elevated above your heart for at least 5-7 days or longer to prevent swelling. This will improve your comfort and your overall recovery following surgery.     2. Please call Dr. Ezella Kell's office at [phone] with questions Monday-Friday during business hours. If no one answers, please leave a message and someone should get back to the patient within 24 hours. For emergencies please call 911 or proceed to the emergency room.   3. Patient to notify surgical team if experiences any of the following: Bowel/Bladder dysfunction, uncontrolled pain, nerve/muscle weakness, incision with increased drainage or redness, nausea/vomiting and Fever greater than 101.0 F.  Be alert for signs of infection including redness, streaking, odor, fever or chills. Be alert for excessive pain or bleeding and notify your surgeon immediately.  WOUND INSTRUCTIONS:   Leave your dressing/cast/splint in place until your post operative visit.  Keep it clean and dry.  Always keep the incision clean and dry until the staples/sutures are removed. If there is no drainage from the incision you should keep it open to air. If there is drainage from the incision you must keep it covered at all times until the drainage stops  Do not soak in a bath tub, hot tub, pool, lake or other body of water until 21 days after your surgery and your incision is completely dry and healed.  If you  have removable sutures (or staples) they must be removed 10-14 days (unless otherwise instructed) from the day of your surgery.     1)  Elevate the extremity as much as possible.  2)  Keep the dressing clean and dry.  3)  Please call us if the dressing becomes wet or dirty.  4)  If you are experiencing worsening pain or worsening swelling, please call.     MEDICATIONS: Resume all previous home medications at the previous prescribed dose and frequency unless otherwise noted Start taking the  pain medications on an as-needed basis as prescribed  Please taper down pain medication over the next week following surgery.  Ideally you should not require a refill of any narcotic pain medication.  Take pain medication with food to minimize nausea. In addition to the prescribed pain medication, you may take over-the-counter pain relievers such as Tylenol.  Do NOT take additional tylenol if your pain medication already has tylenol in it.  Aspirin 325mg daily for four weeks.      FOLLOWUP INSTRUCTIONS: 1. Follow up at the Physical Therapy Clinic 3-4 days following surgery. This appointment should be scheduled unless other arrangements have been made.The Physical Therapy scheduling number is [phone] if an appointment has not already been arranged.  2. Contact Dr. Shabree Tebbetts's office during office hours at [phone] or the practice after hours line at [phone] for non-emergencies. For medical emergencies call 911.   Discharge Location: Home  

## 2022-08-22 NOTE — Interval H&P Note (Signed)
History and Physical Interval Note:  08/22/2022 12:02 PM  [name]  has presented today for surgery, with the diagnosis of RIGHT GLENOHUMERAL OSTEOARTHRITIS.  The various methods of treatment have been discussed with the patient and family. After consideration of risks, benefits and other options for treatment, the patient has consented to  Procedure(s): RIGHT REVERSE SHOULDER ARTHROPLASTY (Right) as a surgical intervention.  The patient's history has been reviewed, patient examined, no change in status, stable for surgery.  I have reviewed the patient's chart and labs.  Questions were answered to the patient's satisfaction.     Vanetta Mulders

## 2022-08-22 NOTE — Op Note (Signed)
Date of Surgery: 08/22/2022  INDICATIONS: Mr. [name] is a 76 y.o.-year-old male with right shoulder rotator cuff arthropathy which has failed conservative managment.  The risk and benefits of the procedure were discussed in detail and documented in the pre-operative evaluation.   PREOPERATIVE DIAGNOSIS: 1. Right shoulder rotator cuff arthropathy  POSTOPERATIVE DIAGNOSIS: Same.  PROCEDURE: 1. Right shoulder reverse shoulder arthroplasty 2. Right shoulder biceps tenodesis  SURGEON: Yevonne Pax MD  ASSISTANT: Raynelle Fanning, ATC  ANESTHESIA:  general plus interscalene nerve block  IV FLUIDS AND URINE: See anesthesia record.  ANTIBIOTICS: Ancef  ESTIMATED BLOOD LOSS: 50 mL.  IMPLANTS:  Implant Name Type Inv. Item Serial No. Manufacturer Lot No. LRB No. Used Action  SCREW BONE INTRNL SM 7 - [id number] Screw SCREW BONE INTRNL SM 7  [employer] [id number] Right 1 Implanted  BASEPLATE SHOULDER FW 60Y 29 - [id number] Joint BASEPLATE SHOULDER FW 35T 29  [employer] [id number] Right 1 Implanted  BASEPLATE GLENOID STD REV 42 - [id number] Joint BASEPLATE GLENOID STD REV 42  [employer] [id number] Right 1 Implanted  CUP HUM REV SHLD 3/4 42 +0 - [id number] Cup CUP HUM REV SHLD 3/4 42 +0  [employer] [id number] Right 1 Implanted  STEM HUMERAL STD SHORT SZ3 - [id number] Joint STEM HUMERAL STD SHORT [employer] [id number] Right 1 Implanted  SCREW 5.5X26 - [id number] Screw SCREW 5.5X26  [employer]  Right 3 Implanted  SCREW PERIPHERAL 30 - [id number] Screw SCREW PERIPHERAL 30  [employer]  Right 1 Implanted    DRAINS: None  CULTURES: None  COMPLICATIONS: none  DESCRIPTION OF PROCEDURE:  Patient was identified in the preoperative holding area.  Anesthesia performed an interscalene nerve block after universal timeout was performed with nursing.  Ancef was given 1 hour prior to skin incision.    The surgical site was scrubbed with a chlorhexidine scrub brush and alcohol.  The patient was  then prepped with chlorhexidine skin prep.  The patient was subsequently taken back to the operating room.  Anesthesia was induced.  He was transferred to the beachchair position.  All bony prominences were padded.  Final timeout was again performed.     The bony landmarks of the shoulder were marked with a marking pen. A delto-pectoral incision was made, extending up approximately 5 inches. The wound with then irrigated with dilute betadine. Cephalic vein was identified, and an protected. This was retracted medially. Subdeltoid and subpectoral lesions were released. Neurovascular structures were carefully protected. The Gelpi retractor was used to retract the deltoid and pectoralis major. A 1 cm release was performed on the upper pectoralis.   The deltoid was retracted laterally with a Brown humeral retractor.  The conjoined tendon was identified. The cleido-pectoral fascia was excised.  The axillary nerve was palpated and carefully protected throughout the procedure. The biceps tendon was found and tenodesed to the upper pec with # 2 FiberWire.  Proximally the biceps tendon was removed up to the joint.  The bicipital groove was used for a landmark to establish rotator cuff interval. The subscap was tagged with a #2 FiberWire.  At this point the subscap was peeled off from the lesser tuberosity with care to avoid dissection distally in order to protect the axillary nerve.  Once the joint was exposed the proximal humerus was delivered with external rotation and extension of the arm. The humerus was prepped initially by performing a humeral neck cut. This was done with the guide using 30 degrees of  retroversion as a reference.  The head portion was removed.  A medullary sounding reamer was then used.  We subsequently placed our guidewire through the center of the humeral head using the reference guide.  This was a size 3.  Metaphyseal reamer was then used.  Finally the size 3 broach was malleted into place  with excellent purchase.  A tonsil clamp was used to attempt to pull this out with very good purchase   Attention was then turned to the glenoid.  Posteriorly a large Darach retractor was used.  A 360 Degree release of the subscapularis and glenoid were done. The capsule was released from the humerus.    Glenoid retractors were placed posteriorly, superiorly behind the biceps tendon and anteriorly on the glenoid neck. A 360-degree release of the capsule was performed with cautery.  The triceps was released off the inferior tubercle of the glenoid. The axillary nerve was carefully protected with the surgeon's index finger, retracting it and using cautery.   A guidepin was placed through the glenoid guide. The guidepin was drilled until it exited the cortex. The guidepin was over drilled. Next, the glenoid was prepared with the reamer  down to cortical bone.  The central peg hole was totally within the scapular neck tested with the probe.  The baseplate was then placed screwed securely with good purchase in position and then secured with 4 screws. In each case, they were drilled and measured and the appropriate length screw placed with excellent rigid fixation of the baseplate.    A 0 liner was then used with the appropriate broach.  This was brought to just the level of the reduction but not completely reduced.  A 0 retentive final poly was selected and impacted.    Appropriate tension was noted on the conjoined tendon and deltoid muscle.  Extension was stable, external and internal rotation as well.  The subscap was pulled over but as this was not able to reach comfortably decision was made not to repair in order to prevent limited in external rotation.  The wound was then irrigated. Vancomycin powder was placed in the wound again for infection prevention.   The wound was then closed in layers with 0 Vicryl interrupted in the deep subcu followed by 2-0 Vicryl in the superficial subcu and staples for skin.   An Aquacel dressing was applied as well as an Naval architect.  A shoulder immobilizer was applied.        POSTOPERATIVE PLAN: He will follow the reverse shoulder arthroplasty protocol and remain in sling until see by PT. He will be placed on aspirin for blood clot prevention.I will see him back in 2 weeks for wound check.  Yevonne Pax, MD 2:49 PM

## 2022-08-22 NOTE — Anesthesia Postprocedure Evaluation (Signed)
Anesthesia Post Note  Patient: MARGUES FILIPPINI  Procedure(s) Performed: RIGHT REVERSE SHOULDER ARTHROPLASTY (Right: Shoulder)     Patient location during evaluation: PACU Anesthesia Type: General and Regional Level of consciousness: awake and alert Pain management: pain level controlled Vital Signs Assessment: post-procedure vital signs reviewed and stable Respiratory status: spontaneous breathing, nonlabored ventilation and respiratory function stable Cardiovascular status: blood pressure returned to baseline and stable Postop Assessment: no apparent nausea or vomiting Anesthetic complications: no  No notable events documented.  Last Vitals:  Vitals:   08/22/22 1545 08/22/22 1600  BP: (!) 147/90 (!) 155/86  Pulse: 78 91  Resp: 10 (!) 23  Temp:  36.8 C  SpO2: 90% 94%    Last Pain:  Vitals:   08/22/22 1530  TempSrc:   PainSc: 0-No pain                 Mieczyslaw Stamas,W. EDMOND

## 2022-08-22 NOTE — Anesthesia Procedure Notes (Signed)
Procedure Name: Intubation Date/Time: 08/22/2022 1:02 PM  Performed by: Elvin So, CRNAPre-anesthesia Checklist: Patient identified, Emergency Drugs available, Suction available and Patient being monitored Patient Re-evaluated:Patient Re-evaluated prior to induction Oxygen Delivery Method: Circle System Utilized Preoxygenation: Pre-oxygenation with 100% oxygen Induction Type: IV induction Ventilation: Mask ventilation without difficulty Laryngoscope Size: Mac and 4 Grade View: Grade I Tube type: Oral Tube size: 7.5 mm Number of attempts: 1 Airway Equipment and Method: Stylet and Oral airway Placement Confirmation: ETT inserted through vocal cords under direct vision, positive ETCO2 and breath sounds checked- equal and bilateral Secured at: 21 cm Tube secured with: Tape Dental Injury: Teeth and Oropharynx as per pre-operative assessment

## 2022-08-22 NOTE — Transfer of Care (Signed)
Immediate Anesthesia Transfer of Care Note  Patient: [name]  Procedure(s) Performed: RIGHT REVERSE SHOULDER ARTHROPLASTY (Right: Shoulder)  Patient Location: PACU  Anesthesia Type:General  Level of Consciousness: awake and patient cooperative  Airway & Oxygen Therapy: Patient Spontanous Breathing and Patient connected to face mask oxygen  Post-op Assessment: Report given to RN, Post -op Vital signs reviewed and stable, and Patient moving all extremities  Post vital signs: Reviewed and stable  Last Vitals:  Vitals Value Taken Time  BP 149/63 08/22/22 1500  Temp    Pulse 69 08/22/22 1501  Resp 13 08/22/22 1501  SpO2 99 % 08/22/22 1501  Vitals shown include unvalidated device data.  Last Pain:  Vitals:   08/22/22 1155  TempSrc:   PainSc: 0-No pain      Patients Stated Pain Goal: 0 (70/01/74 9449)  Complications: No notable events documented.

## 2022-08-22 NOTE — Anesthesia Procedure Notes (Signed)
Anesthesia Regional Block: Interscalene brachial plexus block   Pre-Anesthetic Checklist: , timeout performed,  Correct Patient, Correct Site, Correct Laterality,  Correct Procedure, Correct Position, site marked,  Risks and benefits discussed,  Pre-op evaluation,  At surgeon's request and post-op pain management  Laterality: Right  Prep: Maximum Sterile Barrier Precautions used, chloraprep       Needles:  Injection technique: Single-shot  Needle Type: Echogenic Stimulator Needle     Needle Length: 5cm  Needle Gauge: 22     Additional Needles:   Procedures:,,,, ultrasound used (permanent image in chart),,    Narrative:  Start time: 08/22/2022 11:37 AM End time: 08/22/2022 11:47 AM Injection made incrementally with aspirations every 5 mL.  Performed by: Personally  Anesthesiologist: Roderic Palau, MD

## 2022-08-22 NOTE — Brief Op Note (Signed)
   Brief Op Note  Date of Surgery: 08/22/2022  Preoperative Diagnosis: RIGHT GLENOHUMERAL OSTEOARTHRITIS  Postoperative Diagnosis: same  Procedure: Procedure(s): RIGHT REVERSE SHOULDER ARTHROPLASTY  Implants: Implant Name Type Inv. Item Serial No. Manufacturer Lot No. LRB No. Used Action  SCREW BONE INTRNL SM 7 - [id number] Screw SCREW BONE INTRNL SM 7  [employer] [id number] Right 1 Implanted  BASEPLATE SHOULDER FW 38S 29 - [id number] Joint BASEPLATE SHOULDER FW 19F 29  [employer] [id number] Right 1 Implanted  BASEPLATE GLENOID STD REV 42 - [id number] Joint BASEPLATE GLENOID STD REV 42  [employer] [id number] Right 1 Implanted  CUP HUM REV SHLD 3/4 42 +0 - [id number] Cup CUP HUM REV SHLD 3/4 42 +0  [employer] [id number] Right 1 Implanted  STEM HUMERAL STD SHORT SZ3 - [id number] Joint STEM HUMERAL STD SHORT [employer] [id number] Right 1 Implanted  SCREW 5.5X26 - [id number] Screw SCREW 5.5X26  [employer]  Right 3 Implanted  SCREW PERIPHERAL 30 - [id number] Screw SCREW PERIPHERAL 30  [employer]  Right 1 Implanted    Surgeons: Surgeon(s): Vanetta Mulders, MD  Anesthesia: Regional    Estimated Blood Loss: See anesthesia record  Complications: None  Condition to PACU: Stable  Yevonne Pax, MD 08/22/2022 2:49 PM

## 2022-08-23 ENCOUNTER — Telehealth: Payer: Self-pay | Admitting: *Deleted

## 2022-08-23 NOTE — Telephone Encounter (Signed)
Patient D/C call completed; had lots of questions related to Lovenox injections, pain management, icing, abduction pillow and continued numbness of hand/arm. Answered all questions and informed that CM had reached out to Dr. Sammuel Hines and his [facility] and someone would be calling to check on him later today. Therapy scheduled for Tuesday, 08/27/22 in [place].

## 2022-08-26 ENCOUNTER — Encounter (HOSPITAL_COMMUNITY): Payer: Self-pay | Admitting: Orthopaedic Surgery

## 2022-08-27 DIAGNOSIS — M12811 Other specific arthropathies, not elsewhere classified, right shoulder: Secondary | ICD-10-CM | POA: Diagnosis not present

## 2022-08-29 DIAGNOSIS — M12811 Other specific arthropathies, not elsewhere classified, right shoulder: Secondary | ICD-10-CM | POA: Diagnosis not present

## 2022-08-30 ENCOUNTER — Telehealth: Payer: Self-pay | Admitting: *Deleted

## 2022-08-30 NOTE — Telephone Encounter (Signed)
Ortho bundle call completed. 

## 2022-09-02 DIAGNOSIS — Z96612 Presence of left artificial shoulder joint: Secondary | ICD-10-CM

## 2022-09-02 HISTORY — DX: Presence of left artificial shoulder joint: Z96.612

## 2022-09-03 DIAGNOSIS — M12811 Other specific arthropathies, not elsewhere classified, right shoulder: Secondary | ICD-10-CM | POA: Diagnosis not present

## 2022-09-05 ENCOUNTER — Ambulatory Visit (INDEPENDENT_AMBULATORY_CARE_PROVIDER_SITE_OTHER): Payer: Medicare HMO | Admitting: Orthopaedic Surgery

## 2022-09-05 ENCOUNTER — Telehealth: Payer: Self-pay | Admitting: Orthopaedic Surgery

## 2022-09-05 ENCOUNTER — Ambulatory Visit (INDEPENDENT_AMBULATORY_CARE_PROVIDER_SITE_OTHER): Payer: Medicare HMO

## 2022-09-05 DIAGNOSIS — M1711 Unilateral primary osteoarthritis, right knee: Secondary | ICD-10-CM

## 2022-09-05 DIAGNOSIS — Z96611 Presence of right artificial shoulder joint: Secondary | ICD-10-CM | POA: Diagnosis not present

## 2022-09-05 DIAGNOSIS — Z471 Aftercare following joint replacement surgery: Secondary | ICD-10-CM | POA: Diagnosis not present

## 2022-09-05 DIAGNOSIS — Z9889 Other specified postprocedural states: Secondary | ICD-10-CM | POA: Diagnosis not present

## 2022-09-05 MED ORDER — LIDOCAINE HCL 1 % IJ SOLN
4.0000 mL | INTRAMUSCULAR | Status: AC | PRN
  Administered 2022-09-05: 4 mL

## 2022-09-05 MED ORDER — TRIAMCINOLONE ACETONIDE 40 MG/ML IJ SUSP
80.0000 mg | INTRAMUSCULAR | Status: AC | PRN
  Administered 2022-09-05: 80 mg via INTRA_ARTICULAR

## 2022-09-05 NOTE — Telephone Encounter (Signed)
Patient would like refill on doxycycline (VIBRA-TABS) 100 MG tablet  sent to [facility]

## 2022-09-05 NOTE — Progress Notes (Signed)
Post Operative Evaluation    Procedure/Date of Surgery: Right shoulder reverse shoulder arthroplasty 12/21  Interval History:   Presents today 2-week status post right shoulder reverse shoulder arthroplasty overall doing extremely well.  His pain is well-controlled.  He has been working with physical therapy.  He has been compliant with aspirin usage.  He is taking a daily oxycodone.  He does also endorse knee pain for the course of the last several years.  There is been swelling as well.   PMH/PSH/Family History/Social History/Meds/Allergies:    Past Medical History:  Diagnosis Date  . Allergic rhinitis   . Atypical chest pain 12/01/2014  . Back pain 01/12/2013  . COPD (chronic obstructive pulmonary disease) (HCC)    FeV1 64%-2007  . Dyspnea   . Dysrhythmia    PVCs  . Emphysema   . GERD (gastroesophageal reflux disease)   . History of lung abscess    bronchiectasis with RMLandRLL ersection -1996- Dr Arlyce Dice  . Hordeolum externum (stye) 04/23/2015   Right eye  . Hypertension   . Impaired vision    glasses  . Osteoarthritis    hands and knees  . Pneumonia   . Sinusitis, acute 04/23/2015   Past Surgical History:  Procedure Laterality Date  . COLONOSCOPY  11/20/2010   Dr.Stark  . ESOPHAGOGASTRODUODENOSCOPY (EGD) WITH PROPOFOL N/A 10/31/2021   Procedure: ESOPHAGOGASTRODUODENOSCOPY (EGD) WITH PROPOFOL;  Surgeon: Gatha Mayer, MD;  Location: [facility];  Service: Gastroenterology;  Laterality: N/A;  . HEMOSTASIS CONTROL  10/31/2021   Procedure: HEMOSTASIS CONTROL;  Surgeon: Gatha Mayer, MD;  Location: [facility] ENDOSCOPY;  Service: Gastroenterology;;  . HERNIA REPAIR  10/03/2009  . HOT HEMOSTASIS N/A 10/31/2021   Procedure: HOT HEMOSTASIS (ARGON PLASMA COAGULATION/BICAP);  Surgeon: Gatha Mayer, MD;  Location: [place];  Service: Gastroenterology;  Laterality: N/A;  . left knee    . lower back surgery  09/02/2008   06/2009  .  LUNG SURGERY     RML andRUL removed due to bleeding and bronchiectasis and lung abcess  . NECK SURGERY  05/03/2008  . REVERSE SHOULDER ARTHROPLASTY Right 08/22/2022   Procedure: RIGHT REVERSE SHOULDER ARTHROPLASTY;  Surgeon: Vanetta Mulders, MD;  Location: [place];  Service: Orthopedics;  Laterality: Right;  . right foot surgery    . SCLEROTHERAPY  10/31/2021   Procedure: SCLEROTHERAPY;  Surgeon: Gatha Mayer, MD;  Location: [facility] ENDOSCOPY;  Service: Gastroenterology;;  . TONSILLECTOMY     removed as a child  . VIDEO BRONCHOSCOPY N/A 07/12/2020   Procedure: VIDEO BRONCHOSCOPY;  Surgeon: [name], MD;  Location: [place];  Service: Thoracic;  Laterality: N/A;  . VIDEO BRONCHOSCOPY WITH ENDOBRONCHIAL NAVIGATION N/A 07/12/2020   Procedure: VIDEO BRONCHOSCOPY WITH ENDOBRONCHIAL NAVIGATION;  Surgeon: [name], MD;  Location: [facility] OR;  Service: Thoracic;  Laterality: N/A;   Social History   Socioeconomic History  . Marital status: Married    Spouse name: Not on file  . Number of children: Not on file  . Years of education: Not on file  . Highest education level: Not on file  Occupational History  . Occupation: retired Music therapist  Tobacco Use  . Smoking status: Former    Packs/day: 1.50    Years: 38.00    Total pack years: 57.00    Types: Cigarettes  Quit date: 09/03/2000    Years since quitting: 22.0    Passive exposure: Past  . Smokeless tobacco: Never  Vaping Use  . Vaping Use: Never used  Substance and Sexual Activity  . Alcohol use: Yes    Alcohol/week: 1.0 standard drink of alcohol    Types: 1 Cans of beer per week    Comment: 3 or 4 nights weekly  . Drug use: No  . Sexual activity: Never  Other Topics Concern  . Not on file  Social History Narrative   Retired Music therapist   Patient states former smoker. 1 1/2 ppd x 38 yrs  Quit in Jan 2002   Married - 2 weeks (4th marriage)   divorced,  remarried 84-2004 (lost wife to lung ca),   remarried (divorced),    1 son  - 88 [name])   Alcohol use-yes (2-3 beers per week)           Social Determinants of Health   Financial Resource Strain: Low Risk  (11/01/2021)   Overall Financial Resource Strain (CARDIA)   . Difficulty of Paying Living Expenses: Not very hard  Food Insecurity: No Food Insecurity (02/28/2021)   Hunger Vital Sign   . Worried About Charity fundraiser in the Last Year: Never true   . Ran Out of Food in the Last Year: Never true  Transportation Needs: No Transportation Needs (02/28/2021)   PRAPARE - Transportation   . Lack of Transportation (Medical): No   . Lack of Transportation (Non-Medical): No  Physical Activity: Sufficiently Active (02/28/2021)   Exercise Vital Sign   . Days of Exercise per Week: 6 days   . Minutes of Exercise per Session: 30 min  Stress: No Stress Concern Present (02/28/2021)   [place]   . Feeling of Stress : Not at all  Social Connections: Moderately Integrated (02/28/2021)   Social Connection and Isolation Panel [NHANES]   . Frequency of Communication with Friends and Family: More than three times a week   . Frequency of Social Gatherings with Friends and Family: More than three times a week   . Attends Religious Services: More than 4 times per year   . Active Member of Clubs or Organizations: No   . Attends Archivist Meetings: Never   . Marital Status: Married   Family History  Problem Relation Age of Onset  . Heart failure Mother        age 61  . Prostate cancer Father        father died prostate ca  . Obstructive Sleep Apnea Brother   . Obesity Brother   . Colon cancer Paternal Grandmother   . Healthy Son   . Coronary artery disease Other        1st degree relative<60  . Stroke Other        1st degree relative<50  . Esophageal cancer Neg Hx   . Stomach cancer Neg Hx    Allergies  Allergen Reactions  . Nsaids     Do not give to  patient due to hx of stomach bleeds   . Losartan Other (See Comments)    weakness  . Meloxicam     Caused Stomach Bleeds    Current Outpatient Medications  Medication Sig Dispense Refill  . albuterol (VENTOLIN HFA) 108 (90 Base) MCG/ACT inhaler INHALE 2 PUFFS INTO THE LUNGS EVERY 6 HOURS AS NEEDED FOR WHEEZING OR SHORTNESS OF BREATH. 2 each  10  . amLODipine (NORVASC) 5 MG tablet Take 1 tablet (5 mg total) by mouth daily. (Patient taking differently: Take 2.5 mg by mouth in the morning and at bedtime.) 90 tablet 1  . amLODipine (NORVASC) 5 MG tablet Take 1 & 1/2 tablet (7.5 mg total) by mouth daily. (Patient not taking: Reported on 07/29/2022) 135 tablet 1  . benzonatate (TESSALON) 100 MG capsule TAKE 1 CAPSULE BY MOUTH TWICE DAILY AS NEEDED FOR COUGH 40 capsule 0  . calcium elemental as carbonate (BARIATRIC TUMS ULTRA) 400 MG chewable tablet Chew 1,000 mg by mouth daily.    . cetirizine (ZYRTEC) 10 MG tablet Take 10 mg by mouth daily.    Marland Kitchen COVID-19 mRNA vaccine 2023-2024 (COMIRNATY) SUSP injection Inject into the muscle. 0.3 mL 0  . doxycycline (VIBRA-TABS) 100 MG tablet Take 1 tablet (100 mg total) by mouth 2 (two) times daily. (Patient not taking: Reported on 08/20/2022) 20 tablet 0  . enoxaparin (LOVENOX) 40 MG/0.4ML injection Inject 0.4 mLs (40 mg total) into the skin daily for 14 days. 5.6 mL 0  . fluticasone (FLONASE) 50 MCG/ACT nasal spray USE 2 SPRAYS IN EACH NOSTRIL EVERY DAY 48 g 10  . Glucosamine-Chondroitin 750-600 MG TABS Take 1 tablet by mouth 2 (two) times daily.    Marland Kitchen guaiFENesin (MUCINEX) 600 MG 12 hr tablet Take 600 mg by mouth 2 (two) times daily.    Marland Kitchen loratadine (CLARITIN) 10 MG tablet Take 10 mg by mouth daily.    . Menthol, Topical Analgesic, (BLUE-EMU MAXIMUM STRENGTH EX) Apply 1 application topically daily as needed (arthritis pain).     . montelukast (SINGULAIR) 10 MG tablet Take 10 mg by mouth daily.    . Multiple Vitamin (MULTIVITAMIN WITH MINERALS) TABS tablet Take 1  tablet by mouth daily.     Marland Kitchen oxyCODONE (ROXICODONE) 5 MG immediate release tablet Take 1 tablet (5 mg total) by mouth every 4 (four) hours as needed for severe pain or breakthrough pain. 30 tablet 0  . pantoprazole (PROTONIX) 20 MG tablet Take 1 tablet (20 mg total) by mouth daily. 30 tablet 11  . TRELEGY ELLIPTA 100-62.5-25 MCG/ACT AEPB INHALE 1 PUFF INTO THE LUNGS DAILY 180 each 1  . triamcinolone cream (KENALOG) 0.1 % Apply 1 Application topically 2 (two) times daily as needed (irritation).     No current facility-administered medications for this visit.   No results found.  Review of Systems:   A ROS was performed including pertinent positives and negatives as documented in the HPI.   Musculoskeletal Exam:    There were no vitals taken for this visit.  Right shoulder incision is well-appearing without erythema or drainage.  Range of motion in the supine position is to 90 degrees forward elevation and 20 degrees external rotation.  Internal rotation deferred today.  Distal neurosensory exam is intact.  2+ radial pulse.  Right knee range of motion is from 0 to 120 degrees with tenderness about the medial lateral joint lines.  Negative ligamentous exam  Imaging:    3 views right shoulder x-ray: Status post right shoulder reverse shoulder arthroplasty without evidence of complication  I personally reviewed and interpreted the radiographs.   Assessment:   77 year old male who is 2 weeks status post right shoulder reverse shoulder arthroplasty overall doing extremely well.  He does have some baseline right knee osteoarthritis and would like an injection for this as well.  Will plan to perform this today.  I will plan to see him back  in 4 weeks for reassessment  Plan :    -Right knee ultrasound-guided injection performed after verbal consent obtained    Procedure Note  Patient: [name]             Date of Birth: October 07, 1945           MRN: [id number]             Visit  Date: 09/05/2022  Procedures: Visit Diagnoses:  1. History of reverse total replacement of right shoulder joint     Large Joint Inj: R knee on 09/05/2022 10:29 AM Indications: pain Details: 22 G 1.5 in needle, ultrasound-guided anterior approach  Arthrogram: No  Medications: 4 mL lidocaine 1 %; 80 mg triamcinolone acetonide 40 MG/ML Outcome: tolerated well, no immediate complications Procedure, treatment alternatives, risks and benefits explained, specific risks discussed. Consent was given by the patient. Immediately prior to procedure a time out was called to verify the correct patient, procedure, equipment, support staff and site/side marked as required. Patient was prepped and draped in the usual sterile fashion.           I personally saw and evaluated the patient, and participated in the management and treatment plan.  Vanetta Mulders, MD Attending Physician, Orthopedic Surgery  This document was dictated using Dragon voice recognition software. A reasonable attempt at proof reading has been made to minimize errors.

## 2022-09-06 DIAGNOSIS — M12811 Other specific arthropathies, not elsewhere classified, right shoulder: Secondary | ICD-10-CM | POA: Diagnosis not present

## 2022-09-09 DIAGNOSIS — C44519 Basal cell carcinoma of skin of other part of trunk: Secondary | ICD-10-CM | POA: Diagnosis not present

## 2022-09-10 DIAGNOSIS — M12811 Other specific arthropathies, not elsewhere classified, right shoulder: Secondary | ICD-10-CM | POA: Diagnosis not present

## 2022-09-11 ENCOUNTER — Other Ambulatory Visit: Payer: Self-pay | Admitting: Thoracic Surgery (Cardiothoracic Vascular Surgery)

## 2022-09-11 DIAGNOSIS — R911 Solitary pulmonary nodule: Secondary | ICD-10-CM

## 2022-09-12 DIAGNOSIS — M12811 Other specific arthropathies, not elsewhere classified, right shoulder: Secondary | ICD-10-CM | POA: Diagnosis not present

## 2022-09-17 DIAGNOSIS — M12811 Other specific arthropathies, not elsewhere classified, right shoulder: Secondary | ICD-10-CM | POA: Diagnosis not present

## 2022-09-19 ENCOUNTER — Other Ambulatory Visit: Payer: Self-pay | Admitting: Family Medicine

## 2022-09-19 DIAGNOSIS — M12811 Other specific arthropathies, not elsewhere classified, right shoulder: Secondary | ICD-10-CM | POA: Diagnosis not present

## 2022-09-24 DIAGNOSIS — M12811 Other specific arthropathies, not elsewhere classified, right shoulder: Secondary | ICD-10-CM | POA: Diagnosis not present

## 2022-09-26 DIAGNOSIS — M12811 Other specific arthropathies, not elsewhere classified, right shoulder: Secondary | ICD-10-CM | POA: Diagnosis not present

## 2022-09-27 ENCOUNTER — Other Ambulatory Visit: Payer: Self-pay | Admitting: Family Medicine

## 2022-09-30 ENCOUNTER — Ambulatory Visit: Payer: Medicare HMO | Admitting: Cardiovascular Disease

## 2022-10-01 DIAGNOSIS — M12811 Other specific arthropathies, not elsewhere classified, right shoulder: Secondary | ICD-10-CM | POA: Diagnosis not present

## 2022-10-03 ENCOUNTER — Ambulatory Visit (INDEPENDENT_AMBULATORY_CARE_PROVIDER_SITE_OTHER): Payer: Medicare HMO

## 2022-10-03 ENCOUNTER — Ambulatory Visit (INDEPENDENT_AMBULATORY_CARE_PROVIDER_SITE_OTHER): Payer: Medicare HMO | Admitting: Orthopaedic Surgery

## 2022-10-03 DIAGNOSIS — M25512 Pain in left shoulder: Secondary | ICD-10-CM | POA: Diagnosis not present

## 2022-10-03 DIAGNOSIS — G8929 Other chronic pain: Secondary | ICD-10-CM | POA: Diagnosis not present

## 2022-10-03 DIAGNOSIS — M25561 Pain in right knee: Secondary | ICD-10-CM | POA: Diagnosis not present

## 2022-10-03 DIAGNOSIS — M19012 Primary osteoarthritis, left shoulder: Secondary | ICD-10-CM | POA: Diagnosis not present

## 2022-10-03 DIAGNOSIS — M1711 Unilateral primary osteoarthritis, right knee: Secondary | ICD-10-CM | POA: Diagnosis not present

## 2022-10-03 DIAGNOSIS — M12811 Other specific arthropathies, not elsewhere classified, right shoulder: Secondary | ICD-10-CM | POA: Diagnosis not present

## 2022-10-03 NOTE — Progress Notes (Signed)
Post Operative Evaluation    Procedure/Date of Surgery: Right shoulder reverse shoulder arthroplasty 12/21  Interval History:   Presents today status post the above procedure.  Overall he is continuing to improve.  His range of motion is improving with physical therapy.  He is having quite significant left shoulder pain in the setting of known glenohumeral osteoarthritis.  Right knee injection did quite well he has been getting significant relief from this.   PMH/PSH/Family History/Social History/Meds/Allergies:    Past Medical History:  Diagnosis Date   Allergic rhinitis    Atypical chest pain 12/01/2014   Back pain 01/12/2013   COPD (chronic obstructive pulmonary disease) (HCC)    FeV1 64%-2007   Dyspnea    Dysrhythmia    PVCs   Emphysema    GERD (gastroesophageal reflux disease)    History of lung abscess    bronchiectasis with RMLandRLL ersection -1996- Dr Arlyce Dice   Hordeolum externum (stye) 04/23/2015   Right eye   Hypertension    Impaired vision    glasses   Osteoarthritis    hands and knees   Pneumonia    Sinusitis, acute 04/23/2015   Past Surgical History:  Procedure Laterality Date   COLONOSCOPY  11/20/2010   Dr.Stark   ESOPHAGOGASTRODUODENOSCOPY (EGD) WITH PROPOFOL N/A 10/31/2021   Procedure: ESOPHAGOGASTRODUODENOSCOPY (EGD) WITH PROPOFOL;  Surgeon: Gatha Mayer, MD;  Location: [place];  Service: Gastroenterology;  Laterality: N/A;   HEMOSTASIS CONTROL  10/31/2021   Procedure: HEMOSTASIS CONTROL;  Surgeon: Gatha Mayer, MD;  Location: [place];  Service: Gastroenterology;;   HERNIA REPAIR  10/03/2009   HOT HEMOSTASIS N/A 10/31/2021   Procedure: HOT HEMOSTASIS (ARGON PLASMA COAGULATION/BICAP);  Surgeon: Gatha Mayer, MD;  Location: [place];  Service: Gastroenterology;  Laterality: N/A;   left knee     lower back surgery  09/02/2008   06/2009   LUNG SURGERY     RML andRUL removed due to bleeding and  bronchiectasis and lung abcess   NECK SURGERY  05/03/2008   REVERSE SHOULDER ARTHROPLASTY Right 08/22/2022   Procedure: RIGHT REVERSE SHOULDER ARTHROPLASTY;  Surgeon: Vanetta Mulders, MD;  Location: [place];  Service: Orthopedics;  Laterality: Right;   right foot surgery     SCLEROTHERAPY  10/31/2021   Procedure: SCLEROTHERAPY;  Surgeon: Gatha Mayer, MD;  Location: [facility] ENDOSCOPY;  Service: Gastroenterology;;   TONSILLECTOMY     removed as a child   VIDEO BRONCHOSCOPY N/A 07/12/2020   Procedure: VIDEO BRONCHOSCOPY;  Surgeon: [name], MD;  Location: [facility] OR;  Service: Thoracic;  Laterality: N/A;   VIDEO BRONCHOSCOPY WITH ENDOBRONCHIAL NAVIGATION N/A 07/12/2020   Procedure: VIDEO BRONCHOSCOPY WITH ENDOBRONCHIAL NAVIGATION;  Surgeon: [name], MD;  Location: [facility] OR;  Service: Thoracic;  Laterality: N/A;   Social History   Socioeconomic History   Marital status: Married    Spouse name: Not on file   Number of children: Not on file   Years of education: Not on file   Highest education level: Not on file  Occupational History   Occupation: retired Music therapist  Tobacco Use   Smoking status: Former    Packs/day: 1.50    Years: 38.00    Total pack years: 57.00    Types: Cigarettes    Quit date: 09/03/2000  Years since quitting: 22.0    Passive exposure: Past   Smokeless tobacco: Never  Vaping Use   Vaping Use: Never used  Substance and Sexual Activity   Alcohol use: Yes    Alcohol/week: 1.0 standard drink of alcohol    Types: 1 Cans of beer per week    Comment: 3 or 4 nights weekly   Drug use: No   Sexual activity: Never  Other Topics Concern   Not on file  Social History Narrative   Retired Music therapist   Patient states former smoker. 1 1/2 ppd x 38 yrs  Quit in Jan 2002   Married - 2 weeks (4th marriage)   divorced,  remarried 84-2004 (lost wife to lung ca),  remarried (divorced),    1 son  - 11 [name])   Alcohol use-yes (2-3  beers per week)           Social Determinants of Health   Financial Resource Strain: Low Risk  (11/01/2021)   Overall Financial Resource Strain (CARDIA)    Difficulty of Paying Living Expenses: Not very hard  Food Insecurity: No Food Insecurity (02/28/2021)   Hunger Vital Sign    Worried About Running Out of Food in the Last Year: Never true    Ran Out of Food in the Last Year: Never true  Transportation Needs: No Transportation Needs (02/28/2021)   PRAPARE - Hydrologist (Medical): No    Lack of Transportation (Non-Medical): No  Physical Activity: Sufficiently Active (02/28/2021)   Exercise Vital Sign    Days of Exercise per Week: 6 days    Minutes of Exercise per Session: 30 min  Stress: No Stress Concern Present (02/28/2021)   [place]    Feeling of Stress : Not at all  Social Connections: Moderately Integrated (02/28/2021)   Social Connection and Isolation Panel [NHANES]    Frequency of Communication with Friends and Family: More than three times a week    Frequency of Social Gatherings with Friends and Family: More than three times a week    Attends Religious Services: More than 4 times per year    Active Member of [employer] or Organizations: No    Attends Music therapist: Never    Marital Status: Married   Family History  Problem Relation Age of Onset   Heart failure Mother        age 22   Prostate cancer Father        father died prostate ca   Obstructive Sleep Apnea Brother    Obesity Brother    Colon cancer Paternal Grandmother    Healthy Son    Coronary artery disease Other        1st degree relative<60   Stroke Other        1st degree relative<50   Esophageal cancer Neg Hx    Stomach cancer Neg Hx    Allergies  Allergen Reactions   Nsaids     Do not give to patient due to hx of stomach bleeds    Losartan Other (See Comments)    weakness   Meloxicam      Caused Stomach Bleeds    Current Outpatient Medications  Medication Sig Dispense Refill   albuterol (VENTOLIN HFA) 108 (90 Base) MCG/ACT inhaler INHALE 2 PUFFS INTO THE LUNGS EVERY 6 HOURS AS NEEDED FOR WHEEZING OR SHORTNESS OF BREATH. 2 each 10   amLODipine (NORVASC) 5  MG tablet Take 1 tablet (5 mg total) by mouth daily. (Patient taking differently: Take 2.5 mg by mouth in the morning and at bedtime.) 90 tablet 1   amLODipine (NORVASC) 5 MG tablet Take 1 & 1/2 tablet (7.5 mg total) by mouth daily. (Patient not taking: Reported on 07/29/2022) 135 tablet 1   benzonatate (TESSALON) 100 MG capsule TAKE 1 CAPSULE BY MOUTH TWICE DAILY AS NEEDED FOR COUGH 40 capsule 0   calcium elemental as carbonate (BARIATRIC TUMS ULTRA) 400 MG chewable tablet Chew 1,000 mg by mouth daily.     cetirizine (ZYRTEC) 10 MG tablet Take 10 mg by mouth daily.     COVID-19 mRNA vaccine 2023-2024 (COMIRNATY) SUSP injection Inject into the muscle. 0.3 mL 0   doxycycline (VIBRA-TABS) 100 MG tablet Take 1 tablet (100 mg total) by mouth 2 (two) times daily. (Patient not taking: Reported on 08/20/2022) 20 tablet 0   enoxaparin (LOVENOX) 40 MG/0.4ML injection Inject 0.4 mLs (40 mg total) into the skin daily for 14 days. 5.6 mL 0   fluticasone (FLONASE) 50 MCG/ACT nasal spray USE 2 SPRAYS IN EACH NOSTRIL EVERY DAY 48 g 10   Glucosamine-Chondroitin 750-600 MG TABS Take 1 tablet by mouth 2 (two) times daily.     guaiFENesin (MUCINEX) 600 MG 12 hr tablet Take 600 mg by mouth 2 (two) times daily.     loratadine (CLARITIN) 10 MG tablet Take 10 mg by mouth daily.     Menthol, Topical Analgesic, (BLUE-EMU MAXIMUM STRENGTH EX) Apply 1 application topically daily as needed (arthritis pain).      montelukast (SINGULAIR) 10 MG tablet Take 10 mg by mouth daily.     Multiple Vitamin (MULTIVITAMIN WITH MINERALS) TABS tablet Take 1 tablet by mouth daily.      oxyCODONE (ROXICODONE) 5 MG immediate release tablet Take 1 tablet (5 mg total) by mouth  every 4 (four) hours as needed for severe pain or breakthrough pain. 30 tablet 0   pantoprazole (PROTONIX) 20 MG tablet Take 1 tablet (20 mg total) by mouth daily. 30 tablet 11   TRELEGY ELLIPTA 100-62.5-25 MCG/ACT AEPB INHALE 1 PUFF INTO THE LUNGS DAILY 180 each 1   triamcinolone cream (KENALOG) 0.1 % Apply 1 Application topically 2 (two) times daily as needed (irritation).     No current facility-administered medications for this visit.   DG Shoulder Left  Result Date: 10/03/2022 CLINICAL DATA:  Pain EXAM: LEFT SHOULDER - 3 VIEW COMPARISON:  None Available. FINDINGS: There is no evidence of fracture or dislocation. The acromial humeral distance is diminished consistent with underlying rotator cuff defect. Glenohumeral and acromioclavicular degenerative change with sclerosis and osteophytes. No acute fracture, dislocation or subluxation identified. IMPRESSION: Degenerative changes. Rotator cuff defect. No acute osseous abnormalities. Electronically Signed   By: Sammie Bench M.D.   On: 10/03/2022 09:45   DG Knee Complete 4 Views Right  Result Date: 10/03/2022 CLINICAL DATA:  Pain EXAM: RIGHT KNEE - COMPLETE 4 VIEW COMPARISON:  02/07/2022 FINDINGS: Patellofemoral and lateral compartment osteophytes and joint space narrowing. Some early degenerative change medially with joint space narrowing. No acute fracture, dislocation or subluxation. There is a moderate suprapatellar effusion. IMPRESSION: Degenerative changes. Effusion. No acute osseous abnormalities and no change compared to the prior study. Electronically Signed   By: Sammie Bench M.D.   On: 10/03/2022 09:43    Review of Systems:   A ROS was performed including pertinent positives and negatives as documented in the HPI.   Musculoskeletal Exam:  There were no vitals taken for this visit.  Right shoulder incision is well-appearing without erythema or drainage.  Range of motion in the supine position is to 90 degrees forward  elevation and 20 degrees external rotation.  Internal rotation deferred today.  Distal neurosensory exam is intact.  2+ radial pulse.  Right knee range of motion is from 0 to 120 degrees with tenderness about the medial lateral joint lines.  Negative ligamentous exam  Imaging:    3 views right shoulder x-ray: Status post right shoulder reverse shoulder arthroplasty without evidence of complication  I personally reviewed and interpreted the radiographs.   Assessment:   77 year old male who is 6 weeks status post right shoulder reverse shoulder arthroplasty.  Overall he is doing quite well today.  I would like him to continue to work through range of motion.  I will plan to see him back in 6 weeks for final check on the right shoulder.  I did discuss that we could perform additional injections of the right knee as needed.  Plan :    -Return to clinic in 6 weeks for assessment of the right shoulder      I personally saw and evaluated the patient, and participated in the management and treatment plan.  Vanetta Mulders, MD Attending Physician, Orthopedic Surgery  This document was dictated using Dragon voice recognition software. A reasonable attempt at proof reading has been made to minimize errors.

## 2022-10-08 DIAGNOSIS — M12811 Other specific arthropathies, not elsewhere classified, right shoulder: Secondary | ICD-10-CM | POA: Diagnosis not present

## 2022-10-10 DIAGNOSIS — M12811 Other specific arthropathies, not elsewhere classified, right shoulder: Secondary | ICD-10-CM | POA: Diagnosis not present

## 2022-10-14 ENCOUNTER — Ambulatory Visit (INDEPENDENT_AMBULATORY_CARE_PROVIDER_SITE_OTHER): Payer: Medicare HMO | Admitting: Family

## 2022-10-14 VITALS — BP 135/73 | HR 79 | Temp 97.7°F | Resp 16 | Wt 148.0 lb

## 2022-10-14 DIAGNOSIS — J432 Centrilobular emphysema: Secondary | ICD-10-CM | POA: Diagnosis not present

## 2022-10-14 DIAGNOSIS — G894 Chronic pain syndrome: Secondary | ICD-10-CM | POA: Diagnosis not present

## 2022-10-14 MED ORDER — TRAMADOL HCL 50 MG PO TABS: 50.0000 mg | ORAL_TABLET | Freq: Two times a day (BID) | ORAL | 0 refills | Status: DC | PRN

## 2022-10-14 MED ORDER — BENZONATATE 100 MG PO CAPS: ORAL_CAPSULE | ORAL | 2 refills | Status: DC

## 2022-10-14 NOTE — Progress Notes (Signed)
Subjective:     Patient ID: [name], male    DOB: 1946-04-18, 77 y.o.   MRN: [id number]  Chief Complaint  Patient presents with   Arthritis    Complains of arthritis pain    HPI Patient is in today for follow up.  Chronic cough- at baseline- requesting referral of tessalon.  Chronic pain (arthritis)- mostly in knees, hips, shoulders and hands.  Had a shoulder replacement right on 12/21 and will ultimately need the left replaced as well. He has had good relief with prn use of tramadol. He has run out of the rx that he was given back in April. Reports pain level is about 7/10 if it is cold.  He is taking tylenol 3x daily.  Reports that he has to wear ace andages on his knees "just to walk." He follows with Dr. Vanetta Mulders for orthopedics.     Health Maintenance Due  Topic Date Due   COVID-19 Vaccine (6 - 2023-24 season) 08/08/2022    Past Medical History:  Diagnosis Date   Allergic rhinitis    Atypical chest pain 12/01/2014   Back pain 01/12/2013   COPD (chronic obstructive pulmonary disease) (HCC)    FeV1 64%-2007   Dyspnea    Dysrhythmia    PVCs   Emphysema    GERD (gastroesophageal reflux disease)    History of lung abscess    bronchiectasis with RMLandRLL ersection -1996- Dr Arlyce Dice   Hordeolum externum (stye) 04/23/2015   Right eye   Hypertension    Impaired vision    glasses   Osteoarthritis    hands and knees   Pneumonia    Sinusitis, acute 04/23/2015    Past Surgical History:  Procedure Laterality Date   COLONOSCOPY  11/20/2010   Dr.Stark   ESOPHAGOGASTRODUODENOSCOPY (EGD) WITH PROPOFOL N/A 10/31/2021   Procedure: ESOPHAGOGASTRODUODENOSCOPY (EGD) WITH PROPOFOL;  Surgeon: Gatha Mayer, MD;  Location: [place];  Service: Gastroenterology;  Laterality: N/A;   HEMOSTASIS CONTROL  10/31/2021   Procedure: HEMOSTASIS CONTROL;  Surgeon: Gatha Mayer, MD;  Location: [place];  Service: Gastroenterology;;   HERNIA REPAIR  10/03/2009   HOT  HEMOSTASIS N/A 10/31/2021   Procedure: HOT HEMOSTASIS (ARGON PLASMA COAGULATION/BICAP);  Surgeon: Gatha Mayer, MD;  Location: [place];  Service: Gastroenterology;  Laterality: N/A;   left knee     lower back surgery  09/02/2008   06/2009   LUNG SURGERY     RML andRUL removed due to bleeding and bronchiectasis and lung abcess   NECK SURGERY  05/03/2008   REVERSE SHOULDER ARTHROPLASTY Right 08/22/2022   Procedure: RIGHT REVERSE SHOULDER ARTHROPLASTY;  Surgeon: Vanetta Mulders, MD;  Location: [place];  Service: Orthopedics;  Laterality: Right;   right foot surgery     SCLEROTHERAPY  10/31/2021   Procedure: SCLEROTHERAPY;  Surgeon: Gatha Mayer, MD;  Location: [facility] ENDOSCOPY;  Service: Gastroenterology;;   TONSILLECTOMY     removed as a child   VIDEO BRONCHOSCOPY N/A 07/12/2020   Procedure: VIDEO BRONCHOSCOPY;  Surgeon: [name], MD;  Location: [place];  Service: Thoracic;  Laterality: N/A;   VIDEO BRONCHOSCOPY WITH ENDOBRONCHIAL NAVIGATION N/A 07/12/2020   Procedure: VIDEO BRONCHOSCOPY WITH ENDOBRONCHIAL NAVIGATION;  Surgeon: [name], MD;  Location: [facility] OR;  Service: Thoracic;  Laterality: N/A;    Family History  Problem Relation Age of Onset   Heart failure Mother        age 85   Prostate cancer Father  father died prostate ca   Obstructive Sleep Apnea Brother    Obesity Brother    Colon cancer Paternal Grandmother    Healthy Son    Coronary artery disease Other        1st degree relative<60   Stroke Other        1st degree relative<50   Esophageal cancer Neg Hx    Stomach cancer Neg Hx     Social History   Socioeconomic History   Marital status: Married    Spouse name: Not on file   Number of children: Not on file   Years of education: Not on file   Highest education level: Not on file  Occupational History   Occupation: retired Music therapist  Tobacco Use   Smoking status: Former    Packs/day: 1.50    Years: 38.00     Total pack years: 57.00    Types: Cigarettes    Quit date: 09/03/2000    Years since quitting: 22.1    Passive exposure: Past   Smokeless tobacco: Never  Vaping Use   Vaping Use: Never used  Substance and Sexual Activity   Alcohol use: Yes    Alcohol/week: 1.0 standard drink of alcohol    Types: 1 Cans of beer per week    Comment: 3 or 4 nights weekly   Drug use: No   Sexual activity: Never  Other Topics Concern   Not on file  Social History Narrative   Retired Music therapist   Patient states former smoker. 1 1/2 ppd x 38 yrs  Quit in Jan 2002   Married - 2 weeks (4th marriage)   divorced,  remarried 84-2004 (lost wife to lung ca),  remarried (divorced),    1 son  - 59 [name])   Alcohol use-yes (2-3 beers per week)           Social Determinants of Health   Financial Resource Strain: Low Risk  (11/01/2021)   Overall Financial Resource Strain (CARDIA)    Difficulty of Paying Living Expenses: Not very hard  Food Insecurity: No Food Insecurity (02/28/2021)   Hunger Vital Sign    Worried About Running Out of Food in the Last Year: Never true    Ran Out of Food in the Last Year: Never true  Transportation Needs: No Transportation Needs (02/28/2021)   PRAPARE - Hydrologist (Medical): No    Lack of Transportation (Non-Medical): No  Physical Activity: Sufficiently Active (02/28/2021)   Exercise Vital Sign    Days of Exercise per Week: 6 days    Minutes of Exercise per Session: 30 min  Stress: No Stress Concern Present (02/28/2021)   [place]    Feeling of Stress : Not at all  Social Connections: Moderately Integrated (02/28/2021)   Social Connection and Isolation Panel [NHANES]    Frequency of Communication with Friends and Family: More than three times a week    Frequency of Social Gatherings with Friends and Family: More than three times a week    Attends Religious Services:  More than 4 times per year    Active Member of [employer] or Organizations: No    Attends Archivist Meetings: Never    Marital Status: Married  Human resources officer Violence: Not At Risk (02/28/2021)   Humiliation, Afraid, Rape, and Kick questionnaire    Fear of Current or Ex-Partner: No    Emotionally Abused: No  Physically Abused: No    Sexually Abused: No    Outpatient Medications Prior to Visit  Medication Sig Dispense Refill   albuterol (VENTOLIN HFA) 108 (90 Base) MCG/ACT inhaler INHALE 2 PUFFS INTO THE LUNGS EVERY 6 HOURS AS NEEDED FOR WHEEZING OR SHORTNESS OF BREATH. 2 each 10   amLODipine (NORVASC) 5 MG tablet Take 1 tablet (5 mg total) by mouth daily. (Patient taking differently: Take 2.5 mg by mouth in the morning and at bedtime.) 90 tablet 1   calcium elemental as carbonate (BARIATRIC TUMS ULTRA) 400 MG chewable tablet Chew 1,000 mg by mouth daily.     cetirizine (ZYRTEC) 10 MG tablet Take 10 mg by mouth daily.     fluticasone (FLONASE) 50 MCG/ACT nasal spray USE 2 SPRAYS IN EACH NOSTRIL EVERY DAY 48 g 10   Glucosamine-Chondroitin 750-600 MG TABS Take 1 tablet by mouth 2 (two) times daily.     guaiFENesin (MUCINEX) 600 MG 12 hr tablet Take 600 mg by mouth 2 (two) times daily.     loratadine (CLARITIN) 10 MG tablet Take 10 mg by mouth daily.     Menthol, Topical Analgesic, (BLUE-EMU MAXIMUM STRENGTH EX) Apply 1 application topically daily as needed (arthritis pain).      montelukast (SINGULAIR) 10 MG tablet Take 10 mg by mouth daily.     Multiple Vitamin (MULTIVITAMIN WITH MINERALS) TABS tablet Take 1 tablet by mouth daily.      pantoprazole (PROTONIX) 20 MG tablet Take 1 tablet (20 mg total) by mouth daily. 30 tablet 11   TRELEGY ELLIPTA 100-62.5-25 MCG/ACT AEPB INHALE 1 PUFF INTO THE LUNGS DAILY 180 each 1   triamcinolone cream (KENALOG) 0.1 % Apply 1 Application topically 2 (two) times daily as needed (irritation).     benzonatate (TESSALON) 100 MG capsule TAKE 1 CAPSULE  BY MOUTH TWICE DAILY AS NEEDED FOR COUGH 40 capsule 0   amLODipine (NORVASC) 5 MG tablet Take 1 & 1/2 tablet (7.5 mg total) by mouth daily. (Patient not taking: Reported on 07/29/2022) 135 tablet 1   COVID-19 mRNA vaccine 2023-2024 (COMIRNATY) SUSP injection Inject into the muscle. 0.3 mL 0   doxycycline (VIBRA-TABS) 100 MG tablet Take 1 tablet (100 mg total) by mouth 2 (two) times daily. (Patient not taking: Reported on 08/20/2022) 20 tablet 0   enoxaparin (LOVENOX) 40 MG/0.4ML injection Inject 0.4 mLs (40 mg total) into the skin daily for 14 days. 5.6 mL 0   oxyCODONE (ROXICODONE) 5 MG immediate release tablet Take 1 tablet (5 mg total) by mouth every 4 (four) hours as needed for severe pain or breakthrough pain. 30 tablet 0   No facility-administered medications prior to visit.    Allergies  Allergen Reactions   Nsaids     Do not give to patient due to hx of stomach bleeds    Losartan Other (See Comments)    weakness   Meloxicam     Caused Stomach Bleeds     ROS See HPI    Objective:    Physical Exam Constitutional:      General: He is not in acute distress.    Appearance: He is well-developed.  HENT:     Head: Normocephalic and atraumatic.  Cardiovascular:     Rate and Rhythm: Normal rate and regular rhythm.     Heart sounds: No murmur heard. Pulmonary:     Effort: Pulmonary effort is normal. No respiratory distress.     Breath sounds: Wheezing (soft left sided expiratory wheeze) present. No  rales.  Skin:    General: Skin is warm and dry.  Neurological:     Mental Status: He is alert and oriented to person, place, and time.  Psychiatric:        Behavior: Behavior normal.        Thought Content: Thought content normal.     BP 135/73 (BP Location: Right Arm, Patient Position: Sitting, Cuff Size: Small)   Pulse 79   Temp 97.7 F (36.5 C) (Oral)   Resp 16   Wt 148 lb (67.1 kg)   SpO2 99%   BMI 23.18 kg/m  Wt Readings from Last 3 Encounters:  10/14/22 148 lb  (67.1 kg)  08/22/22 143 lb (64.9 kg)  07/29/22 144 lb 12.8 oz (65.7 kg)       Assessment & Plan:   Problem List Items Addressed This Visit       Unprioritized   COPD GOLD II D    Has chronic cough which is currently at baseline. Maintained on Trelegy. Continue same. Refill provided for prn tessalon.       Relevant Medications   benzonatate (TESSALON) 100 MG capsule   Chronic pain syndrome - Primary    Chronic, has had good relief with prn use of tramadol. Will have him continue standing tylenol and may use tramadol prn breakthrough pain.  I gave him 30 tabs today to last him the month. Controled substance contract is updated today.       Relevant Medications   traMADol (ULTRAM) 50 MG tablet    I have discontinued Day S. Reish "Al"'s COVID-19 mRNA vaccine 2023-2024, enoxaparin, doxycycline, and oxyCODONE. I am also having him start on traMADol. Additionally, I am having him maintain his loratadine, guaiFENesin, Glucosamine-Chondroitin, calcium elemental as carbonate, multivitamin with minerals, (Menthol, Topical Analgesic, (BLUE-EMU MAXIMUM STRENGTH EX)), cetirizine, pantoprazole, Trelegy Ellipta, amLODipine, fluticasone, albuterol, montelukast, triamcinolone cream, and benzonatate.  Meds ordered this encounter  Medications   traMADol (ULTRAM) 50 MG tablet    Sig: Take 1 tablet (50 mg total) by mouth every 12 (twelve) hours as needed.    Dispense:  30 tablet    Refill:  0    Order Specific Question:   Supervising Provider    Answer:   Penni Homans A [[id number]]   benzonatate (TESSALON) 100 MG capsule    Sig: TAKE 1 CAPSULE BY MOUTH TWICE DAILY AS NEEDED FOR COUGH    Dispense:  40 capsule    Refill:  2    Order Specific Question:   Supervising Provider    Answer:   Penni Homans A [id number]

## 2022-10-14 NOTE — Assessment & Plan Note (Signed)
Chronic, has had good relief with prn use of tramadol. Will have him continue standing tylenol and may use tramadol prn breakthrough pain.  I gave him 30 tabs today to last him the month. Controled substance contract is updated today.

## 2022-10-14 NOTE — Patient Instructions (Signed)
Let me know a few days before you run out of your tramadol when you need a refill.

## 2022-10-14 NOTE — Assessment & Plan Note (Addendum)
Has chronic cough which is currently at baseline. Maintained on Trelegy. Continue same. Refill provided for prn tessalon.

## 2022-10-15 DIAGNOSIS — M12811 Other specific arthropathies, not elsewhere classified, right shoulder: Secondary | ICD-10-CM | POA: Diagnosis not present

## 2022-10-17 DIAGNOSIS — M12811 Other specific arthropathies, not elsewhere classified, right shoulder: Secondary | ICD-10-CM | POA: Diagnosis not present

## 2022-10-22 DIAGNOSIS — M12811 Other specific arthropathies, not elsewhere classified, right shoulder: Secondary | ICD-10-CM | POA: Diagnosis not present

## 2022-10-24 DIAGNOSIS — M12811 Other specific arthropathies, not elsewhere classified, right shoulder: Secondary | ICD-10-CM | POA: Diagnosis not present

## 2022-10-29 DIAGNOSIS — M12811 Other specific arthropathies, not elsewhere classified, right shoulder: Secondary | ICD-10-CM | POA: Diagnosis not present

## 2022-10-31 DIAGNOSIS — M12811 Other specific arthropathies, not elsewhere classified, right shoulder: Secondary | ICD-10-CM | POA: Diagnosis not present

## 2022-11-05 ENCOUNTER — Encounter: Payer: Self-pay | Admitting: Thoracic Surgery (Cardiothoracic Vascular Surgery)

## 2022-11-05 ENCOUNTER — Ambulatory Visit
Admission: RE | Admit: 2022-11-05 | Discharge: 2022-11-05 | Disposition: A | Discharge status: Home / Self Care | Payer: Medicare HMO | Source: Ambulatory Visit | Attending: Thoracic Surgery (Cardiothoracic Vascular Surgery) | Admitting: Thoracic Surgery (Cardiothoracic Vascular Surgery)

## 2022-11-05 ENCOUNTER — Ambulatory Visit: Payer: Medicare HMO | Admitting: Thoracic Surgery (Cardiothoracic Vascular Surgery)

## 2022-11-05 VITALS — BP 156/73 | HR 63 | Resp 20 | Ht 67.0 in | Wt 149.0 lb

## 2022-11-05 DIAGNOSIS — R918 Other nonspecific abnormal finding of lung field: Secondary | ICD-10-CM | POA: Diagnosis not present

## 2022-11-05 DIAGNOSIS — R911 Solitary pulmonary nodule: Secondary | ICD-10-CM

## 2022-11-05 DIAGNOSIS — M12811 Other specific arthropathies, not elsewhere classified, right shoulder: Secondary | ICD-10-CM | POA: Diagnosis not present

## 2022-11-05 DIAGNOSIS — J439 Emphysema, unspecified: Secondary | ICD-10-CM | POA: Diagnosis not present

## 2022-11-05 DIAGNOSIS — I700 Atherosclerosis of aorta: Secondary | ICD-10-CM | POA: Diagnosis not present

## 2022-11-05 NOTE — Progress Notes (Signed)
[address]       [place] [address]             [phone]     HPI: Mr. Tumolo returns with a schedule CT for lung cancer screening.  [name] is a 77 year old man with a history of tobacco abuse, COPD, right bilobectomy for bronchiectasis and hemoptysis in 1996, recurrent hemoptysis, hypertension, GI bleed, reflux, and arthritis.  He had hemoptysis back in 2021 and CT showed a cystic area in the superior right chest navigational bronchoscopy did not show malignancy.  BAL grew staph and Acinetobacter.  He was treated with multiple courses of antibiotics before his hemoptysis finally resolved in the spring 2022.  When I saw him in the office a year ago he was having melena.  He went to the ED from the office and had massive hematemesis.  He was found to have multiple ulcers.  He required transfusion.  In the interim since that visit he has been doing well.  He has had to have a right shoulder replacement.  He probably will have to have a left shoulder replacement relatively soon.  No hemoptysis.  Has some chronic dyspnea and wheezing.  No recent changes.  Past Medical History:  Diagnosis Date   Allergic rhinitis    Atypical chest pain 12/01/2014   Back pain 01/12/2013   COPD (chronic obstructive pulmonary disease) (HCC)    FeV1 64%-2007   Dyspnea    Dysrhythmia    PVCs   Emphysema    GERD (gastroesophageal reflux disease)    History of lung abscess    bronchiectasis with RMLandRLL ersection -1996- Dr Arlyce Dice   Hordeolum externum (stye) 04/23/2015   Right eye   Hypertension    Impaired vision    glasses   Osteoarthritis    hands and knees   Pneumonia    Sinusitis, acute 04/23/2015    Current Outpatient Medications  Medication Sig Dispense Refill   albuterol (VENTOLIN HFA) 108 (90 Base) MCG/ACT inhaler INHALE 2 PUFFS INTO THE LUNGS EVERY 6 HOURS AS NEEDED FOR WHEEZING OR SHORTNESS OF BREATH. 2 each 10   amLODipine (NORVASC) 5 MG tablet Take 1  tablet (5 mg total) by mouth daily. (Patient taking differently: Take 2.5 mg by mouth in the morning and at bedtime.) 90 tablet 1   benzonatate (TESSALON) 100 MG capsule TAKE 1 CAPSULE BY MOUTH TWICE DAILY AS NEEDED FOR COUGH 40 capsule 2   calcium elemental as carbonate (BARIATRIC TUMS ULTRA) 400 MG chewable tablet Chew 1,000 mg by mouth daily.     cetirizine (ZYRTEC) 10 MG tablet Take 10 mg by mouth daily.     fluticasone (FLONASE) 50 MCG/ACT nasal spray USE 2 SPRAYS IN EACH NOSTRIL EVERY DAY 48 g 10   Glucosamine-Chondroitin 750-600 MG TABS Take 1 tablet by mouth 2 (two) times daily.     guaiFENesin (MUCINEX) 600 MG 12 hr tablet Take 600 mg by mouth 2 (two) times daily.     loratadine (CLARITIN) 10 MG tablet Take 10 mg by mouth daily.     Menthol, Topical Analgesic, (BLUE-EMU MAXIMUM STRENGTH EX) Apply 1 application topically daily as needed (arthritis pain).      montelukast (SINGULAIR) 10 MG tablet Take 10 mg by mouth daily.     Multiple Vitamin (MULTIVITAMIN WITH MINERALS) TABS tablet Take 1 tablet by mouth daily.      pantoprazole (PROTONIX) 20 MG tablet Take 1 tablet (20 mg total) by mouth  daily. 30 tablet 11   traMADol (ULTRAM) 50 MG tablet Take 1 tablet (50 mg total) by mouth every 12 (twelve) hours as needed. 30 tablet 0   TRELEGY ELLIPTA 100-62.5-25 MCG/ACT AEPB INHALE 1 PUFF INTO THE LUNGS DAILY 180 each 1   triamcinolone cream (KENALOG) 0.1 % Apply 1 Application topically 2 (two) times daily as needed (irritation).     No current facility-administered medications for this visit.    Physical Exam BP (!) 156/73 (BP Location: Left Arm, Patient Position: Sitting, Cuff Size: Normal)   Pulse 63   Resp 20   Ht '5\' 7"'$  (1.702 m)   Wt 149 lb (67.6 kg)   SpO2 93% Comment: RA  BMI 23.103 kg/m  77 year old man in no acute distress Alert and oriented x 3 with no focal deficits Lungs with coarse wheezing bilaterally diminished breath sounds at right base Cardiac regular rate and  rhythm  Diagnostic Tests: CT CHEST WITHOUT CONTRAST   TECHNIQUE: Multidetector CT imaging of the chest was performed following the standard protocol without IV contrast.   RADIATION DOSE REDUCTION: This exam was performed according to the departmental dose-optimization program which includes automated exposure control, adjustment of the mA and/or kV according to patient size and/or use of iterative reconstruction technique.   COMPARISON:  Chest CT dated October 30, 2021   FINDINGS: Cardiovascular: Normal heart size. No pericardial effusion. Normal caliber thoracic aorta with moderate atherosclerotic disease. Moderate coronary artery calcifications involving the left main and RCA.   Mediastinum/Nodes: Esophagus is unremarkable. Thyroid is not well visualized due to streak artifact. No pathologically enlarged lymph nodes seen in the chest.   Lungs/Pleura: Prior right middle and right upper lobectomies. Mild centrilobular emphysema. Unchanged pleural thickening of the apical right hemithorax. New solid pulmonary nodule of the right lower lobe measuring 4 mm on series 5, image 57. No pleural effusion or pneumothorax.   Upper Abdomen: No acute abnormality.   Musculoskeletal: Right total shoulder arthroplasty. Partially visualized posterior fusion hardware of the upper lumbar spine. No aggressive appearing osseous lesions   IMPRESSION: 1. Prior right middle and right upper lobectomies. Unchanged apical pleural thickening of the right hemithorax. 2. New solid pulmonary nodule of the right lower lobe measuring 4 mm. Recommend short-term follow-up chest CT in 3-6 months for further evaluation. 3. Aortic Atherosclerosis (ICD10-I70.0) and Emphysema (ICD10-J43.9).     Electronically Signed   By: Yetta Glassman M.D.   On: 11/05/2022 13:32 I personally reviewed the CT images.  New 4 to 5 mm nodule in the right lower lobe.  Impression: [name] is a 77 year old man  with a history of tobacco abuse, COPD, right bilobectomy for bronchiectasis and hemoptysis in 1996, recurrent hemoptysis, hypertension, GI bleed, reflux, and arthritis.  Right upper lobe lung nodule-new from a year ago.  Approximately 4 to 5 mm.  Given his smoking history we have to take this seriously as he is high risk for lung cancer.  Will plan to do another scan in 6 months.  This also could be infectious or inflammatory in nature.  Hemoptysis-resolved no further episodes over the past couple of years.  Tobacco abuse-quit smoking in 2002.  COPD-on Singulair, Trelegy, and albuterol as needed.  Plan: Return in 6 months with CT chest  [name], MD [facility] [phone]

## 2022-11-07 DIAGNOSIS — M12811 Other specific arthropathies, not elsewhere classified, right shoulder: Secondary | ICD-10-CM | POA: Diagnosis not present

## 2022-11-12 DIAGNOSIS — M12811 Other specific arthropathies, not elsewhere classified, right shoulder: Secondary | ICD-10-CM | POA: Diagnosis not present

## 2022-11-13 ENCOUNTER — Ambulatory Visit (INDEPENDENT_AMBULATORY_CARE_PROVIDER_SITE_OTHER): Payer: Medicare HMO

## 2022-11-13 ENCOUNTER — Ambulatory Visit (INDEPENDENT_AMBULATORY_CARE_PROVIDER_SITE_OTHER): Payer: Medicare HMO | Admitting: Orthopaedic Surgery

## 2022-11-13 DIAGNOSIS — Z96611 Presence of right artificial shoulder joint: Secondary | ICD-10-CM | POA: Diagnosis not present

## 2022-11-13 DIAGNOSIS — Z9889 Other specified postprocedural states: Secondary | ICD-10-CM | POA: Diagnosis not present

## 2022-11-13 DIAGNOSIS — M19011 Primary osteoarthritis, right shoulder: Secondary | ICD-10-CM | POA: Diagnosis not present

## 2022-11-13 NOTE — Progress Notes (Signed)
Post Operative Evaluation    Procedure/Date of Surgery: Right shoulder reverse shoulder arthroplasty 12/21  Interval History:   Presents today status post the above procedure.  Overall he is doing very well. His range of motion has been progressing well in physical therapy and now is working toward strengthening. He says that his pain is well controlled. He states that he is now at the point where his left shoulder bothers him more to due his existing osteoarthritis on that side. However he says that he can wait until November or December to have that addressed so that he can still enjoy fishing and other outdoor activities this summer.   PMH/PSH/Family History/Social History/Meds/Allergies:    Past Medical History:  Diagnosis Date   Allergic rhinitis    Atypical chest pain 12/01/2014   Back pain 01/12/2013   COPD (chronic obstructive pulmonary disease) (HCC)    FeV1 64%-2007   Dyspnea    Dysrhythmia    PVCs   Emphysema    GERD (gastroesophageal reflux disease)    History of lung abscess    bronchiectasis with RMLandRLL ersection -1996- Dr Arlyce Dice   Hordeolum externum (stye) 04/23/2015   Right eye   Hypertension    Impaired vision    glasses   Osteoarthritis    hands and knees   Pneumonia    Sinusitis, acute 04/23/2015   Past Surgical History:  Procedure Laterality Date   COLONOSCOPY  11/20/2010   Dr.Stark   ESOPHAGOGASTRODUODENOSCOPY (EGD) WITH PROPOFOL N/A 10/31/2021   Procedure: ESOPHAGOGASTRODUODENOSCOPY (EGD) WITH PROPOFOL;  Surgeon: Gatha Mayer, MD;  Location: [facility];  Service: Gastroenterology;  Laterality: N/A;   HEMOSTASIS CONTROL  10/31/2021   Procedure: HEMOSTASIS CONTROL;  Surgeon: Gatha Mayer, MD;  Location: [facility];  Service: Gastroenterology;;   HERNIA REPAIR  10/03/2009   HOT HEMOSTASIS N/A 10/31/2021   Procedure: HOT HEMOSTASIS (ARGON PLASMA COAGULATION/BICAP);  Surgeon: Gatha Mayer, MD;   Location: [facility];  Service: Gastroenterology;  Laterality: N/A;   left knee     lower back surgery  09/02/2008   06/2009   LUNG SURGERY     RML andRUL removed due to bleeding and bronchiectasis and lung abcess   NECK SURGERY  05/03/2008   REVERSE SHOULDER ARTHROPLASTY Right 08/22/2022   Procedure: RIGHT REVERSE SHOULDER ARTHROPLASTY;  Surgeon: Vanetta Mulders, MD;  Location: [facility];  Service: Orthopedics;  Laterality: Right;   right foot surgery     SCLEROTHERAPY  10/31/2021   Procedure: SCLEROTHERAPY;  Surgeon: Gatha Mayer, MD;  Location: [facility] ENDOSCOPY;  Service: Gastroenterology;;   TONSILLECTOMY     removed as a child   VIDEO BRONCHOSCOPY N/A 07/12/2020   Procedure: VIDEO BRONCHOSCOPY;  Surgeon: [name], MD;  Location: [facility] OR;  Service: Thoracic;  Laterality: N/A;   VIDEO BRONCHOSCOPY WITH ENDOBRONCHIAL NAVIGATION N/A 07/12/2020   Procedure: VIDEO BRONCHOSCOPY WITH ENDOBRONCHIAL NAVIGATION;  Surgeon: [name], MD;  Location: [facility] OR;  Service: Thoracic;  Laterality: N/A;   Social History   Socioeconomic History   Marital status: Married    Spouse name: Not on file   Number of children: Not on file   Years of education: Not on file   Highest education level: Not on file  Occupational History   Occupation: retired Music therapist  Tobacco Use  Smoking status: Former    Packs/day: 1.50    Years: 38.00    Total pack years: 57.00    Types: Cigarettes    Quit date: 09/03/2000    Years since quitting: 22.2    Passive exposure: Past   Smokeless tobacco: Never  Vaping Use   Vaping Use: Never used  Substance and Sexual Activity   Alcohol use: Yes    Alcohol/week: 1.0 standard drink of alcohol    Types: 1 Cans of beer per week    Comment: 3 or 4 nights weekly   Drug use: No   Sexual activity: Never  Other Topics Concern   Not on file  Social History Narrative   Retired Music therapist   Patient states former smoker. 1 1/2 ppd x 38  yrs  Quit in Jan 2002   Married - 2 weeks (4th marriage)   divorced,  remarried 84-2004 (lost wife to lung ca),  remarried (divorced),    1 son  - 71 [name])   Alcohol use-yes (2-3 beers per week)           Social Determinants of Health   Financial Resource Strain: Low Risk  (11/01/2021)   Overall Financial Resource Strain (CARDIA)    Difficulty of Paying Living Expenses: Not very hard  Food Insecurity: No Food Insecurity (02/28/2021)   Hunger Vital Sign    Worried About Running Out of Food in the Last Year: Never true    Ran Out of Food in the Last Year: Never true  Transportation Needs: No Transportation Needs (02/28/2021)   PRAPARE - Hydrologist (Medical): No    Lack of Transportation (Non-Medical): No  Physical Activity: Sufficiently Active (02/28/2021)   Exercise Vital Sign    Days of Exercise per Week: 6 days    Minutes of Exercise per Session: 30 min  Stress: No Stress Concern Present (02/28/2021)   [place]    Feeling of Stress : Not at all  Social Connections: Moderately Integrated (02/28/2021)   Social Connection and Isolation Panel [NHANES]    Frequency of Communication with Friends and Family: More than three times a week    Frequency of Social Gatherings with Friends and Family: More than three times a week    Attends Religious Services: More than 4 times per year    Active Member of [employer] or Organizations: No    Attends Music therapist: Never    Marital Status: Married   Family History  Problem Relation Age of Onset   Heart failure Mother        age 52   Prostate cancer Father        father died prostate ca   Obstructive Sleep Apnea Brother    Obesity Brother    Colon cancer Paternal Grandmother    Healthy Son    Coronary artery disease Other        1st degree relative<60   Stroke Other        1st degree relative<50   Esophageal cancer Neg Hx     Stomach cancer Neg Hx    Allergies  Allergen Reactions   Nsaids     Do not give to patient due to hx of stomach bleeds    Losartan Other (See Comments)    weakness   Meloxicam     Caused Stomach Bleeds    Current Outpatient Medications  Medication Sig Dispense Refill  albuterol (VENTOLIN HFA) 108 (90 Base) MCG/ACT inhaler INHALE 2 PUFFS INTO THE LUNGS EVERY 6 HOURS AS NEEDED FOR WHEEZING OR SHORTNESS OF BREATH. 2 each 10   amLODipine (NORVASC) 5 MG tablet Take 1 tablet (5 mg total) by mouth daily. (Patient taking differently: Take 2.5 mg by mouth in the morning and at bedtime.) 90 tablet 1   benzonatate (TESSALON) 100 MG capsule TAKE 1 CAPSULE BY MOUTH TWICE DAILY AS NEEDED FOR COUGH 40 capsule 2   calcium elemental as carbonate (BARIATRIC TUMS ULTRA) 400 MG chewable tablet Chew 1,000 mg by mouth daily.     cetirizine (ZYRTEC) 10 MG tablet Take 10 mg by mouth daily.     fluticasone (FLONASE) 50 MCG/ACT nasal spray USE 2 SPRAYS IN EACH NOSTRIL EVERY DAY 48 g 10   Glucosamine-Chondroitin 750-600 MG TABS Take 1 tablet by mouth 2 (two) times daily.     guaiFENesin (MUCINEX) 600 MG 12 hr tablet Take 600 mg by mouth 2 (two) times daily.     loratadine (CLARITIN) 10 MG tablet Take 10 mg by mouth daily.     Menthol, Topical Analgesic, (BLUE-EMU MAXIMUM STRENGTH EX) Apply 1 application topically daily as needed (arthritis pain).      montelukast (SINGULAIR) 10 MG tablet Take 10 mg by mouth daily.     Multiple Vitamin (MULTIVITAMIN WITH MINERALS) TABS tablet Take 1 tablet by mouth daily.      pantoprazole (PROTONIX) 20 MG tablet Take 1 tablet (20 mg total) by mouth daily. 30 tablet 11   traMADol (ULTRAM) 50 MG tablet Take 1 tablet (50 mg total) by mouth every 12 (twelve) hours as needed. 30 tablet 0   TRELEGY ELLIPTA 100-62.5-25 MCG/ACT AEPB INHALE 1 PUFF INTO THE LUNGS DAILY 180 each 1   triamcinolone cream (KENALOG) 0.1 % Apply 1 Application topically 2 (two) times daily as needed (irritation).      No current facility-administered medications for this visit.   No results found.  Review of Systems:   A ROS was performed including pertinent positives and negatives as documented in the HPI.   Musculoskeletal Exam:    There were no vitals taken for this visit.  Right shoulder incision is healed.  Active range of motion 130 degrees shoulder flexion in the seated position.  External rotation at the side is to approximately 30 degrees.  Internal rotation above back left pocket.  2+ radial pulse. Distal sensation normal.   Imaging:    3 views right shoulder x-ray: Status post right shoulder reverse shoulder arthroplasty without evidence of complication  I personally reviewed and interpreted the radiographs.   Assessment:   77 year old male who is 3 months status post right shoulder reverse shoulder arthroplasty. He continues to do quite well. Continue to work on strengthening and motion. He does have pain in his left shoulder due to significant osteoarthritis that will need to be addressed.  Plan :    -Continue therapy and return to clinic in 3 months for reassessment of the right shoulder and also discussion of possible left shoulder intervention.      I personally saw and evaluated the patient, and participated in the management and treatment plan.  Marnee Spring, PA-C Orthopedics  This document was dictated using Systems analyst. A reasonable attempt at proof reading has been made to minimize errors.

## 2022-11-17 ENCOUNTER — Other Ambulatory Visit: Payer: Self-pay | Admitting: Family

## 2022-11-19 DIAGNOSIS — M12811 Other specific arthropathies, not elsewhere classified, right shoulder: Secondary | ICD-10-CM | POA: Diagnosis not present

## 2022-11-22 DIAGNOSIS — M12811 Other specific arthropathies, not elsewhere classified, right shoulder: Secondary | ICD-10-CM | POA: Diagnosis not present

## 2022-11-26 DIAGNOSIS — M12811 Other specific arthropathies, not elsewhere classified, right shoulder: Secondary | ICD-10-CM | POA: Diagnosis not present

## 2022-11-28 DIAGNOSIS — M12811 Other specific arthropathies, not elsewhere classified, right shoulder: Secondary | ICD-10-CM | POA: Diagnosis not present

## 2022-12-03 DIAGNOSIS — M12811 Other specific arthropathies, not elsewhere classified, right shoulder: Secondary | ICD-10-CM | POA: Diagnosis not present

## 2022-12-06 DIAGNOSIS — M12811 Other specific arthropathies, not elsewhere classified, right shoulder: Secondary | ICD-10-CM | POA: Diagnosis not present

## 2022-12-07 ENCOUNTER — Other Ambulatory Visit: Payer: Self-pay | Admitting: Gastroenterology

## 2022-12-10 DIAGNOSIS — M12811 Other specific arthropathies, not elsewhere classified, right shoulder: Secondary | ICD-10-CM | POA: Diagnosis not present

## 2022-12-13 DIAGNOSIS — M12811 Other specific arthropathies, not elsewhere classified, right shoulder: Secondary | ICD-10-CM | POA: Diagnosis not present

## 2022-12-20 ENCOUNTER — Other Ambulatory Visit: Payer: Self-pay | Admitting: Gastroenterology

## 2022-12-29 ENCOUNTER — Other Ambulatory Visit: Payer: Self-pay | Admitting: Family

## 2023-01-15 DIAGNOSIS — Z85820 Personal history of malignant melanoma of skin: Secondary | ICD-10-CM | POA: Diagnosis not present

## 2023-01-15 DIAGNOSIS — L814 Other melanin hyperpigmentation: Secondary | ICD-10-CM | POA: Diagnosis not present

## 2023-01-15 DIAGNOSIS — L918 Other hypertrophic disorders of the skin: Secondary | ICD-10-CM | POA: Diagnosis not present

## 2023-01-15 DIAGNOSIS — L821 Other seborrheic keratosis: Secondary | ICD-10-CM | POA: Diagnosis not present

## 2023-01-15 DIAGNOSIS — L218 Other seborrheic dermatitis: Secondary | ICD-10-CM | POA: Diagnosis not present

## 2023-01-15 DIAGNOSIS — Z85828 Personal history of other malignant neoplasm of skin: Secondary | ICD-10-CM | POA: Diagnosis not present

## 2023-01-18 ENCOUNTER — Other Ambulatory Visit: Payer: Self-pay | Admitting: Gastroenterology

## 2023-01-20 NOTE — Assessment & Plan Note (Signed)
Maintain heart healthy diet, exercise regularly and hydrate well

## 2023-01-20 NOTE — Assessment & Plan Note (Signed)
Encourage heart healthy diet such as MIND or DASH diet, increase exercise, avoid trans fats, simple carbohydrates and processed foods, consider a krill or fish or flaxseed oil cap daily.  °

## 2023-01-20 NOTE — Assessment & Plan Note (Signed)
Reminded to maintain adequate protein intake

## 2023-01-20 NOTE — Assessment & Plan Note (Signed)
No recent exacerbation, follows with pulmonology

## 2023-01-20 NOTE — Assessment & Plan Note (Signed)
Well controlled, no changes to meds. Encouraged heart healthy diet such as the DASH diet and exercise as tolerated.  °

## 2023-01-20 NOTE — Assessment & Plan Note (Signed)
hgba1c acceptable, minimize simple carbs. Increase exercise as tolerated.  

## 2023-01-21 ENCOUNTER — Ambulatory Visit (INDEPENDENT_AMBULATORY_CARE_PROVIDER_SITE_OTHER): Payer: Medicare HMO | Admitting: Family Medicine

## 2023-01-21 ENCOUNTER — Other Ambulatory Visit: Payer: Self-pay

## 2023-01-21 VITALS — BP 134/72 | HR 94 | Temp 97.5°F | Resp 16 | Ht 67.0 in | Wt 150.4 lb

## 2023-01-21 DIAGNOSIS — I10 Essential (primary) hypertension: Secondary | ICD-10-CM | POA: Diagnosis not present

## 2023-01-21 DIAGNOSIS — E785 Hyperlipidemia, unspecified: Secondary | ICD-10-CM

## 2023-01-21 DIAGNOSIS — R739 Hyperglycemia, unspecified: Secondary | ICD-10-CM

## 2023-01-21 DIAGNOSIS — J432 Centrilobular emphysema: Secondary | ICD-10-CM

## 2023-01-21 DIAGNOSIS — I059 Rheumatic mitral valve disease, unspecified: Secondary | ICD-10-CM | POA: Diagnosis not present

## 2023-01-21 DIAGNOSIS — R7982 Elevated C-reactive protein (CRP): Secondary | ICD-10-CM

## 2023-01-21 DIAGNOSIS — E43 Unspecified severe protein-calorie malnutrition: Secondary | ICD-10-CM | POA: Diagnosis not present

## 2023-01-21 DIAGNOSIS — R600 Localized edema: Secondary | ICD-10-CM | POA: Diagnosis not present

## 2023-01-21 MED ORDER — TRAMADOL HCL 50 MG PO TABS: 50.0000 mg | ORAL_TABLET | Freq: Two times a day (BID) | ORAL | 0 refills | Status: DC | PRN

## 2023-01-21 MED ORDER — BENZONATATE 100 MG PO CAPS: ORAL_CAPSULE | ORAL | 2 refills | Status: DC

## 2023-01-21 MED ORDER — PANTOPRAZOLE SODIUM 20 MG PO TBEC: 20.0000 mg | DELAYED_RELEASE_TABLET | Freq: Every day | ORAL | 0 refills | Status: DC

## 2023-01-21 NOTE — Progress Notes (Signed)
Subjective:   By signing my name below, I, [name], attest that this documentation has been prepared under the direction and in the presence of Ruben Canary, MD., 01/21/2023.   Patient ID: [name], male    DOB: [birth year], 77 y.o.   MRN: [id number]  No chief complaint on file.  HPI Patient is in today for an office visit.   Bilateral Shoulder Pain Patient complains of bilateral shoulder pain which is worse in the left shoulder. He underwent right reverse shoulder arthroplasty on 08/22/2022 under Dr. Huel Cote and is hoping to undergo the same procedure in the left shoulder this Fall. Additionally, he complains of joint pain in his back and knees and reports that he is taking 0.5 tablets of Tramadol 50 mg as needed, up to 3-4 times weekly.   Pedal Edema Patient complains of worsening bilateral pain and swelling in his ankles. This is making it difficult to walk and he has consequentially been applying tight wraps on both knees. He has varicose veins in both ankles which he says have been present since his 20's. Patient's last echocardiogram was completed on 09/29/2019 and revealed that he has mitral valve disease. He is also comorbid for COPD and lung disease.  Past Medical History:  Diagnosis Date   Allergic rhinitis    Atypical chest pain 12/01/2014   Back pain 01/12/2013   COPD (chronic obstructive pulmonary disease) (HCC)    FeV1 64%-2007   Dyspnea    Dysrhythmia    PVCs   Emphysema    GERD (gastroesophageal reflux disease)    History of lung abscess    bronchiectasis with RMLandRLL ersection -1996- Dr Edwyna Shell   Hordeolum externum (stye) 04/23/2015   Right eye   Hypertension    Impaired vision    glasses   Osteoarthritis    hands and knees   Pneumonia    Sinusitis, acute 04/23/2015    Past Surgical History:  Procedure Laterality Date   COLONOSCOPY  11/20/2010   Dr.Stark   ESOPHAGOGASTRODUODENOSCOPY (EGD) WITH PROPOFOL N/A 10/31/2021    Procedure: ESOPHAGOGASTRODUODENOSCOPY (EGD) WITH PROPOFOL;  Surgeon: Iva Boop, MD;  Location: [facility] ENDOSCOPY;  Service: Gastroenterology;  Laterality: N/A;   HEMOSTASIS CONTROL  10/31/2021   Procedure: HEMOSTASIS CONTROL;  Surgeon: Iva Boop, MD;  Location: [facility] ENDOSCOPY;  Service: Gastroenterology;;   HERNIA REPAIR  10/03/2009   HOT HEMOSTASIS N/A 10/31/2021   Procedure: HOT HEMOSTASIS (ARGON PLASMA COAGULATION/BICAP);  Surgeon: Iva Boop, MD;  Location: [facility] ENDOSCOPY;  Service: Gastroenterology;  Laterality: N/A;   left knee     lower back surgery  09/02/2008   06/2009   LUNG SURGERY     RML andRUL removed due to bleeding and bronchiectasis and lung abcess   NECK SURGERY  05/03/2008   REVERSE SHOULDER ARTHROPLASTY Right 08/22/2022   Procedure: RIGHT REVERSE SHOULDER ARTHROPLASTY;  Surgeon: Huel Cote, MD;  Location: [facility] OR;  Service: Orthopedics;  Laterality: Right;   right foot surgery     SCLEROTHERAPY  10/31/2021   Procedure: SCLEROTHERAPY;  Surgeon: Iva Boop, MD;  Location: [facility] ENDOSCOPY;  Service: Gastroenterology;;   TONSILLECTOMY     removed as a child   VIDEO BRONCHOSCOPY N/A 07/12/2020   Procedure: VIDEO BRONCHOSCOPY;  Surgeon: [name], MD;  Location: [facility] OR;  Service: Thoracic;  Laterality: N/A;   VIDEO BRONCHOSCOPY WITH ENDOBRONCHIAL NAVIGATION N/A 07/12/2020   Procedure: VIDEO BRONCHOSCOPY WITH ENDOBRONCHIAL NAVIGATION;  Surgeon: [name], MD;  Location:  [facility] OR;  Service: Thoracic;  Laterality: N/A;    Family History  Problem Relation Age of Onset   Heart failure Mother        age 48   Prostate cancer Father        father died prostate ca   Obstructive Sleep Apnea Brother    Obesity Brother    Colon cancer Paternal Grandmother    Healthy Son    Coronary artery disease Other        1st degree relative<60   Stroke Other        1st degree relative<50   Esophageal cancer Neg Hx    Stomach cancer Neg Hx     Social  History   Socioeconomic History   Marital status: Married    Spouse name: Not on file   Number of children: Not on file   Years of education: Not on file   Highest education level: Bachelor's degree (e.g., BA, AB, BS)  Occupational History   Occupation: retired Librarian, academic  Tobacco Use   Smoking status: Former    Packs/day: 1.50    Years: 38.00    Additional pack years: 0.00    Total pack years: 57.00    Types: Cigarettes    Quit date: 09/03/2000    Years since quitting: 22.3    Passive exposure: Past   Smokeless tobacco: Never  Vaping Use   Vaping Use: Never used  Substance and Sexual Activity   Alcohol use: Yes    Alcohol/week: 1.0 standard drink of alcohol    Types: 1 Cans of beer per week    Comment: 3 or 4 nights weekly   Drug use: No   Sexual activity: Never  Other Topics Concern   Not on file  Social History Narrative   Retired Librarian, academic   Patient states former smoker. 1 1/2 ppd x 38 yrs  Quit in Jan 2002   Married - 2 weeks (4th marriage)   divorced,  remarried 84-2004 (lost wife to lung ca),  remarried (divorced),    1 son  - 40 [name])   Alcohol use-yes (2-3 beers per week)           Social Determinants of Health   Financial Resource Strain: Low Risk  (01/14/2023)   Overall Financial Resource Strain (CARDIA)    Difficulty of Paying Living Expenses: Not hard at all  Food Insecurity: No Food Insecurity (01/14/2023)   Hunger Vital Sign    Worried About Running Out of Food in the Last Year: Never true    Ran Out of Food in the Last Year: Never true  Transportation Needs: No Transportation Needs (01/14/2023)   PRAPARE - Administrator, Civil Service (Medical): No    Lack of Transportation (Non-Medical): No  Physical Activity: Sufficiently Active (01/14/2023)   Exercise Vital Sign    Days of Exercise per Week: 7 days    Minutes of Exercise per Session: 30 min  Stress: No Stress Concern Present (01/14/2023)   [employer]  of Occupational Health - Occupational Stress Questionnaire    Feeling of Stress : Not at all  Social Connections: Socially Integrated (01/14/2023)   Social Connection and Isolation Panel [NHANES]    Frequency of Communication with Friends and Family: More than three times a week    Frequency of Social Gatherings with Friends and Family: Twice a week    Attends Religious Services: More than 4 times per year    Active  Member of Clubs or Organizations: Yes    Attends Banker Meetings: More than 4 times per year    Marital Status: Married  Catering manager Violence: Not At Risk (02/28/2021)   Humiliation, Afraid, Rape, and Kick questionnaire    Fear of Current or Ex-Partner: No    Emotionally Abused: No    Physically Abused: No    Sexually Abused: No    Outpatient Medications Prior to Visit  Medication Sig Dispense Refill   albuterol (VENTOLIN HFA) 108 (90 Base) MCG/ACT inhaler INHALE 2 PUFFS INTO THE LUNGS EVERY 6 HOURS AS NEEDED FOR WHEEZING OR SHORTNESS OF BREATH. 2 each 10   amLODipine (NORVASC) 5 MG tablet Take 1 tablet (5 mg total) by mouth daily. (Patient taking differently: Take 2.5 mg by mouth in the morning and at bedtime.) 90 tablet 1   benzonatate (TESSALON) 100 MG capsule TAKE 1 CAPSULE BY MOUTH TWICE DAILY AS NEEDED FOR COUGH 40 capsule 2   calcium elemental as carbonate (BARIATRIC TUMS ULTRA) 400 MG chewable tablet Chew 1,000 mg by mouth daily.     cetirizine (ZYRTEC) 10 MG tablet Take 10 mg by mouth daily.     fluticasone (FLONASE) 50 MCG/ACT nasal spray USE 2 SPRAYS IN EACH NOSTRIL EVERY DAY 48 g 10   Glucosamine-Chondroitin 750-600 MG TABS Take 1 tablet by mouth 2 (two) times daily.     guaiFENesin (MUCINEX) 600 MG 12 hr tablet Take 600 mg by mouth 2 (two) times daily.     loratadine (CLARITIN) 10 MG tablet Take 10 mg by mouth daily.     Menthol, Topical Analgesic, (BLUE-EMU MAXIMUM STRENGTH EX) Apply 1 application topically daily as needed (arthritis pain).       montelukast (SINGULAIR) 10 MG tablet Take 10 mg by mouth daily.     Multiple Vitamin (MULTIVITAMIN WITH MINERALS) TABS tablet Take 1 tablet by mouth daily.      pantoprazole (PROTONIX) 20 MG tablet Take 1 tablet by mouth once daily 30 tablet 0   traMADol (ULTRAM) 50 MG tablet TAKE 1 TABLET BY MOUTH EVERY 12 HOURS AS NEEDED 30 tablet 0   TRELEGY ELLIPTA 100-62.5-25 MCG/ACT AEPB INHALE 1 PUFF INTO THE LUNGS DAILY 180 each 1   triamcinolone cream (KENALOG) 0.1 % Apply 1 Application topically 2 (two) times daily as needed (irritation).     No facility-administered medications prior to visit.    Allergies  Allergen Reactions   Nsaids     Do not give to patient due to hx of stomach bleeds    Losartan Other (See Comments)    weakness   Meloxicam     Caused Stomach Bleeds     Review of Systems  Constitutional:  Negative for chills and fever.  Respiratory:  Negative for shortness of breath.   Cardiovascular:  Negative for chest pain and palpitations.       (+) Bilateral pedal edema.  Gastrointestinal:  Negative for abdominal pain, blood in stool, constipation, diarrhea, nausea and vomiting.  Genitourinary:  Negative for dysuria, frequency, hematuria and urgency.  Musculoskeletal:  Positive for back pain and joint pain (Patient complains of back, knee, bilateral shoulder, and equal bilateral ankle pain.).  Skin:           Neurological:  Negative for headaches.       Objective:    Physical Exam Constitutional:      General: He is not in acute distress.    Appearance: Normal appearance. He is not ill-appearing.  HENT:  Head: Normocephalic and atraumatic.     Right Ear: External ear normal.     Left Ear: External ear normal.     Nose: Nose normal.     Mouth/Throat:     Mouth: Mucous membranes are moist.     Pharynx: Oropharynx is clear.  Eyes:     General:        Right eye: No discharge.        Left eye: No discharge.     Extraocular Movements: Extraocular movements  intact.     Conjunctiva/sclera: Conjunctivae normal.     Pupils: Pupils are equal, round, and reactive to light.  Cardiovascular:     Rate and Rhythm: Normal rate and regular rhythm.     Pulses: Normal pulses.     Heart sounds: Normal heart sounds. No murmur heard.    No gallop.  Pulmonary:     Effort: Pulmonary effort is normal. No respiratory distress.     Breath sounds: Normal breath sounds. No wheezing or rales.  Abdominal:     General: Bowel sounds are normal.     Palpations: Abdomen is soft.     Tenderness: There is no abdominal tenderness. There is no guarding.  Musculoskeletal:        General: Normal range of motion.     Cervical back: Normal range of motion.     Right lower leg: No edema.     Left lower leg: No edema.  Skin:    General: Skin is warm and dry.  Neurological:     Mental Status: He is alert and oriented to person, place, and time.  Psychiatric:        Mood and Affect: Mood normal.        Behavior: Behavior normal.        Judgment: Judgment normal.     There were no vitals taken for this visit. Wt Readings from Last 3 Encounters:  11/05/22 149 lb (67.6 kg)  10/14/22 148 lb (67.1 kg)  08/22/22 143 lb (64.9 kg)    Diabetic Foot Exam - Simple   No data filed    Lab Results  Component Value Date   WBC 5.9 08/22/2022   HGB 13.2 08/22/2022   HCT 40.8 08/22/2022   PLT 304 08/22/2022   GLUCOSE 79 08/22/2022   CHOL 139 06/13/2022   TRIG 191.0 (H) 06/13/2022   HDL 49.10 06/13/2022   LDLDIRECT 85 07/22/2014   LDLCALC 52 06/13/2022   ALT 19 06/13/2022   AST 20 06/13/2022   NA 136 08/22/2022   K 3.7 08/22/2022   CL 105 08/22/2022   CREATININE 0.69 08/22/2022   BUN 18 08/22/2022   CO2 21 (L) 08/22/2022   TSH 0.64 06/13/2022   PSA 2.22 08/16/2021   INR 1.1 10/30/2021   HGBA1C 6.4 06/13/2022    Lab Results  Component Value Date   TSH 0.64 06/13/2022   Lab Results  Component Value Date   WBC 5.9 08/22/2022   HGB 13.2 08/22/2022   HCT  40.8 08/22/2022   MCV 90.3 08/22/2022   PLT 304 08/22/2022   Lab Results  Component Value Date   NA 136 08/22/2022   K 3.7 08/22/2022   CO2 21 (L) 08/22/2022   GLUCOSE 79 08/22/2022   BUN 18 08/22/2022   CREATININE 0.69 08/22/2022   BILITOT 0.5 06/13/2022   ALKPHOS 66 06/13/2022   AST 20 06/13/2022   ALT 19 06/13/2022   PROT 6.6 06/13/2022   ALBUMIN 3.6 06/13/2022  CALCIUM 9.1 08/22/2022   ANIONGAP 10 08/22/2022   GFR 88.22 06/13/2022   Lab Results  Component Value Date   CHOL 139 06/13/2022   Lab Results  Component Value Date   HDL 49.10 06/13/2022   Lab Results  Component Value Date   LDLCALC 52 06/13/2022   Lab Results  Component Value Date   TRIG 191.0 (H) 06/13/2022   Lab Results  Component Value Date   CHOLHDL 3 06/13/2022   Lab Results  Component Value Date   HGBA1C 6.4 06/13/2022      Assessment & Plan:  Healthy Lifestyle: Encouraged 6-8 hours of sleep, heart healthy diet, 60-80 oz of non-alcohol/non-caffeinated fluids, and 4000-8000 steps daily.  Labs: Routine blood work ordered today.  Bilateral Shoulder Pain: Patient currently takes 0.5 tablets of Tramadol 50 mg as needed, up to 3-4 times weekly, to manage his back, bilateral ankle, bilateral knee, and bilateral shoulder pain.   Pedal Edema: Echocardiogram ordered. Recommended compression socks such as [place]. Problem List Items Addressed This Visit       Cardiovascular and Mediastinum   Benign essential HTN - Primary     Respiratory   COPD GOLD II D     Other   Hyperglycemia   Hyperlipidemia   CRP elevated   Protein-calorie malnutrition, severe   No orders of the defined types were placed in this encounter.  I, [name], personally preformed the services described in this documentation.  All medical record entries made by the scribe were at my direction and in my presence.  I have reviewed the chart and discharge instructions (if applicable) and agree that the record reflects  my personal performance and is accurate and complete. 01/21/2023  I,Mohammed Iqbal,acting as a scribe for Danise Edge, MD.,have documented all relevant documentation on the behalf of Danise Edge, MD,as directed by  Danise Edge, MD while in the presence of Danise Edge, MD.  [name]

## 2023-01-21 NOTE — Assessment & Plan Note (Signed)
Given his worsening edema will repeat an echo to evaluate for progresssion

## 2023-01-21 NOTE — Patient Instructions (Signed)
Edema  Edema is an abnormal buildup of fluids in the body tissues and under the skin. Swelling of the legs, feet, and ankles is a common symptom that becomes more likely as you get older. Swelling is also common in looser tissues, such as around the eyes. Pressing on the area may make a temporary dent in your skin (pitting edema). This fluid may also accumulate in your lungs (pulmonary edema). There are many possible causes of edema. Eating too much salt (sodium) and being on your feet or sitting for a long time can cause edema in your legs, feet, and ankles. Common causes of edema include: Certain medical conditions, such as heart failure, liver or kidney disease, and cancer. Weak leg blood vessels. An injury. Pregnancy. Medicines. Being obese. Low protein levels in the blood. Hot weather may make edema worse. Edema is usually painless. Your skin may look swollen or shiny. Follow these instructions at home: Medicines Take over-the-counter and prescription medicines only as told by your health care provider. Your health care provider may prescribe a medicine to help your body get rid of extra water (diuretic). Take this medicine if you are told to take it. Eating and drinking Eat a low-salt (low-sodium) diet to reduce fluid as told by your health care provider. Sometimes, eating less salt may reduce swelling. Depending on the cause of your swelling, you may need to limit how much fluid you drink (fluid restriction). General instructions Raise (elevate) the injured area above the level of your heart while you are sitting or lying down. Do not sit still or stand for long periods of time. Do not wear tight clothing. Do not wear garters on your upper legs. Exercise your legs to get your circulation going. This helps to move the fluid back into your blood vessels, and it may help the swelling go down. Wear compression stockings as told by your health care provider. These stockings help to prevent  blood clots and reduce swelling in your legs. It is important that these are the correct size. These stockings should be prescribed by your health care provider to prevent possible injuries. If elastic bandages or wraps are recommended, use them as told by your health care provider. Contact a health care provider if: Your edema does not get better with treatment. You have heart, liver, or kidney disease and have symptoms of edema. You have sudden and unexplained weight gain. Get help right away if: You develop shortness of breath or chest pain. You cannot breathe when you lie down. You develop pain, redness, or warmth in the swollen areas. You have heart, liver, or kidney disease and suddenly get edema. You have a fever and your symptoms suddenly get worse. These symptoms may be an emergency. Get help right away. Call 911. Do not wait to see if the symptoms will go away. Do not drive yourself to the hospital. Summary Edema is an abnormal buildup of fluids in the body tissues and under the skin. Eating too much salt (sodium)and being on your feet or sitting for a long time can cause edema in your legs, feet, and ankles. Raise (elevate) the injured area above the level of your heart while you are sitting or lying down. Follow your health care provider's instructions about diet and how much fluid you can drink. This information is not intended to replace advice given to you by your health care provider. Make sure you discuss any questions you have with your health care provider. Document Revised: 04/23/2021 Document   Reviewed: 04/23/2021 Elsevier Patient Education  2023 [employer]  

## 2023-01-21 NOTE — Assessment & Plan Note (Signed)
Encouraged to minimize sodium, stay active elevate feet above heart and wear compression hose when he is on his feet for long periods of time.

## 2023-01-22 LAB — CBC WITH DIFFERENTIAL/PLATELET
Basophils Absolute: 0 10*3/uL (ref 0.0–0.1)
Basophils Relative: 0.6 % (ref 0.0–3.0)
Eosinophils Absolute: 0.1 10*3/uL (ref 0.0–0.7)
Eosinophils Relative: 1.9 % (ref 0.0–5.0)
HCT: 41.8 % (ref 39.0–52.0)
Hemoglobin: 13.6 g/dL (ref 13.0–17.0)
Lymphocytes Relative: 30.1 % (ref 12.0–46.0)
Lymphs Abs: 1.7 10*3/uL (ref 0.7–4.0)
MCHC: 32.6 g/dL (ref 30.0–36.0)
MCV: 92 fl (ref 78.0–100.0)
Monocytes Absolute: 0.5 10*3/uL (ref 0.1–1.0)
Monocytes Relative: 8.6 % (ref 3.0–12.0)
Neutro Abs: 3.3 10*3/uL (ref 1.4–7.7)
Neutrophils Relative %: 58.8 % (ref 43.0–77.0)
Platelets: 269 10*3/uL (ref 150.0–400.0)
RBC: 4.55 Mil/uL (ref 4.22–5.81)
RDW: 14.8 % (ref 11.5–15.5)
WBC: 5.7 10*3/uL (ref 4.0–10.5)

## 2023-01-22 LAB — COMPREHENSIVE METABOLIC PANEL
ALT: 15 U/L (ref 0–53)
AST: 22 U/L (ref 0–37)
Albumin: 4 g/dL (ref 3.5–5.2)
Alkaline Phosphatase: 86 U/L (ref 39–117)
BUN: 29 mg/dL — ABNORMAL HIGH (ref 6–23)
CO2: 28 mEq/L (ref 19–32)
Calcium: 9.6 mg/dL (ref 8.4–10.5)
Chloride: 105 mEq/L (ref 96–112)
Creatinine, Ser: 0.9 mg/dL (ref 0.40–1.50)
GFR: 83.14 mL/min (ref >60.00)
Glucose, Bld: 135 mg/dL — ABNORMAL HIGH (ref 70–99)
Potassium: 4.4 mEq/L (ref 3.5–5.1)
Sodium: 140 mEq/L (ref 135–145)
Total Bilirubin: 0.5 mg/dL (ref 0.2–1.2)
Total Protein: 6.8 g/dL (ref 6.0–8.3)

## 2023-01-22 LAB — HEMOGLOBIN A1C: Hgb A1c MFr Bld: 5.8 % (ref 4.6–6.5)

## 2023-01-22 LAB — LIPID PANEL
Cholesterol: 161 mg/dL (ref 0–200)
HDL: 52.2 mg/dL (ref >39.00)
LDL Cholesterol: 83 mg/dL (ref 0–99)
NonHDL: 108.51
Total CHOL/HDL Ratio: 3
Triglycerides: 126 mg/dL (ref 0.0–149.0)
VLDL: 25.2 mg/dL (ref 0.0–40.0)

## 2023-01-22 LAB — TSH: TSH: 0.59 u[IU]/mL (ref 0.35–5.50)

## 2023-01-22 LAB — HIGH SENSITIVITY CRP: CRP, High Sensitivity: 6.94 mg/L — ABNORMAL HIGH (ref 0.000–5.000)

## 2023-01-25 ENCOUNTER — Other Ambulatory Visit: Payer: Self-pay | Admitting: Family Medicine

## 2023-02-12 ENCOUNTER — Encounter (HOSPITAL_BASED_OUTPATIENT_CLINIC_OR_DEPARTMENT_OTHER): Payer: Self-pay | Admitting: Student

## 2023-02-12 ENCOUNTER — Ambulatory Visit (HOSPITAL_BASED_OUTPATIENT_CLINIC_OR_DEPARTMENT_OTHER): Payer: Medicare HMO | Admitting: Orthopaedic Surgery

## 2023-02-12 ENCOUNTER — Ambulatory Visit (INDEPENDENT_AMBULATORY_CARE_PROVIDER_SITE_OTHER): Payer: Medicare HMO | Admitting: Student

## 2023-02-12 ENCOUNTER — Ambulatory Visit (HOSPITAL_BASED_OUTPATIENT_CLINIC_OR_DEPARTMENT_OTHER): Payer: Medicare HMO

## 2023-02-12 ENCOUNTER — Telehealth: Payer: Self-pay | Admitting: Family Medicine

## 2023-02-12 DIAGNOSIS — Z01818 Encounter for other preprocedural examination: Secondary | ICD-10-CM | POA: Diagnosis not present

## 2023-02-12 DIAGNOSIS — M1711 Unilateral primary osteoarthritis, right knee: Secondary | ICD-10-CM

## 2023-02-12 DIAGNOSIS — Z471 Aftercare following joint replacement surgery: Secondary | ICD-10-CM | POA: Diagnosis not present

## 2023-02-12 DIAGNOSIS — Z96611 Presence of right artificial shoulder joint: Secondary | ICD-10-CM

## 2023-02-12 MED ORDER — TRIAMCINOLONE ACETONIDE 40 MG/ML IJ SUSP
2.0000 mL | INTRAMUSCULAR | Status: AC | PRN
Start: 2023-02-12 — End: 2023-02-12
  Administered 2023-02-12: 2 mL via INTRA_ARTICULAR

## 2023-02-12 MED ORDER — LIDOCAINE HCL 1 % IJ SOLN
4.0000 mL | INTRAMUSCULAR | Status: AC | PRN
Start: 2023-02-12 — End: 2023-02-12
  Administered 2023-02-12: 4 mL

## 2023-02-12 MED ORDER — PANTOPRAZOLE SODIUM 20 MG PO TBEC: 20.0000 mg | DELAYED_RELEASE_TABLET | Freq: Every day | ORAL | 1 refills | Status: DC

## 2023-02-12 NOTE — Addendum Note (Signed)
Addended byConrad Diamondhead D on: 02/12/2023 04:13 PM   Modules accepted: Orders

## 2023-02-12 NOTE — Progress Notes (Signed)
Post Operative Evaluation    Procedure/Date of Surgery: Right shoulder reverse shoulder arthroplasty 12/21  Interval History:   Presents today 6 months status post the above procedure.  Patient states that his shoulder is overall doing very well.  He still does have some occasional pain however this has improved.  Does also have occasional popping in the shoulder with lifting.  Finished up with physical therapy about 6 weeks ago and reports good range of motion.  He does take Tylenol regularly for pain in multiple joints.  He does report that his knee has become bothersome again after receiving a cortisone injection on 09/05/2022.  He also has known degenerative changes of the left shoulder and would like to make it through the summer before discussing surgical options later this year.   PMH/PSH/Family History/Social History/Meds/Allergies:    Past Medical History:  Diagnosis Date   Allergic rhinitis    Atypical chest pain 12/01/2014   Back pain 01/12/2013   COPD (chronic obstructive pulmonary disease) (HCC)    FeV1 64%-2007   Dyspnea    Dysrhythmia    PVCs   Emphysema    GERD (gastroesophageal reflux disease)    History of lung abscess    bronchiectasis with RMLandRLL ersection -1996- Dr Edwyna Shell   Hordeolum externum (stye) 04/23/2015   Right eye   Hypertension    Impaired vision    glasses   Osteoarthritis    hands and knees   Pneumonia    Sinusitis, acute 04/23/2015   Past Surgical History:  Procedure Laterality Date   COLONOSCOPY  11/20/2010   Dr.Stark   ESOPHAGOGASTRODUODENOSCOPY (EGD) WITH PROPOFOL N/A 10/31/2021   Procedure: ESOPHAGOGASTRODUODENOSCOPY (EGD) WITH PROPOFOL;  Surgeon: Iva Boop, MD;  Location: [facility] ENDOSCOPY;  Service: Gastroenterology;  Laterality: N/A;   HEMOSTASIS CONTROL  10/31/2021   Procedure: HEMOSTASIS CONTROL;  Surgeon: Iva Boop, MD;  Location: [facility] ENDOSCOPY;  Service: Gastroenterology;;   HERNIA  REPAIR  10/03/2009   HOT HEMOSTASIS N/A 10/31/2021   Procedure: HOT HEMOSTASIS (ARGON PLASMA COAGULATION/BICAP);  Surgeon: Iva Boop, MD;  Location: [facility] ENDOSCOPY;  Service: Gastroenterology;  Laterality: N/A;   left knee     lower back surgery  09/02/2008   06/2009   LUNG SURGERY     RML andRUL removed due to bleeding and bronchiectasis and lung abcess   NECK SURGERY  05/03/2008   REVERSE SHOULDER ARTHROPLASTY Right 08/22/2022   Procedure: RIGHT REVERSE SHOULDER ARTHROPLASTY;  Surgeon: Huel Cote, MD;  Location: [facility] OR;  Service: Orthopedics;  Laterality: Right;   right foot surgery     SCLEROTHERAPY  10/31/2021   Procedure: SCLEROTHERAPY;  Surgeon: Iva Boop, MD;  Location: [facility] ENDOSCOPY;  Service: Gastroenterology;;   TONSILLECTOMY     removed as a child   VIDEO BRONCHOSCOPY N/A 07/12/2020   Procedure: VIDEO BRONCHOSCOPY;  Surgeon: [name], MD;  Location: [facility] OR;  Service: Thoracic;  Laterality: N/A;   VIDEO BRONCHOSCOPY WITH ENDOBRONCHIAL NAVIGATION N/A 07/12/2020   Procedure: VIDEO BRONCHOSCOPY WITH ENDOBRONCHIAL NAVIGATION;  Surgeon: [name], MD;  Location: [facility] OR;  Service: Thoracic;  Laterality: N/A;   Social History   Socioeconomic History   Marital status: Married    Spouse name: Not on file   Number of children: Not on file   Years of education:  Not on file   Highest education level: Bachelor's degree (e.g., BA, AB, BS)  Occupational History   Occupation: retired Librarian, academic  Tobacco Use   Smoking status: Former    Packs/day: 1.50    Years: 38.00    Additional pack years: 0.00    Total pack years: 57.00    Types: Cigarettes    Quit date: 09/03/2000    Years since quitting: 22.4    Passive exposure: Past   Smokeless tobacco: Never  Vaping Use   Vaping Use: Never used  Substance and Sexual Activity   Alcohol use: Yes    Alcohol/week: 1.0 standard drink of alcohol    Types: 1 Cans of beer per week    Comment: 3 or  4 nights weekly   Drug use: No   Sexual activity: Never  Other Topics Concern   Not on file  Social History Narrative   Retired Librarian, academic   Patient states former smoker. 1 1/2 ppd x 38 yrs  Quit in Jan 2002   Married - 2 weeks (4th marriage)   divorced,  remarried 84-2004 (lost wife to lung ca),  remarried (divorced),    1 son  - 66 [name])   Alcohol use-yes (2-3 beers per week)           Social Determinants of Health   Financial Resource Strain: Low Risk  (01/14/2023)   Overall Financial Resource Strain (CARDIA)    Difficulty of Paying Living Expenses: Not hard at all  Food Insecurity: No Food Insecurity (01/14/2023)   Hunger Vital Sign    Worried About Running Out of Food in the Last Year: Never true    Ran Out of Food in the Last Year: Never true  Transportation Needs: No Transportation Needs (01/14/2023)   PRAPARE - Administrator, Civil Service (Medical): No    Lack of Transportation (Non-Medical): No  Physical Activity: Sufficiently Active (01/14/2023)   Exercise Vital Sign    Days of Exercise per Week: 7 days    Minutes of Exercise per Session: 30 min  Stress: No Stress Concern Present (01/14/2023)   [employer] of Occupational Health - Occupational Stress Questionnaire    Feeling of Stress : Not at all  Social Connections: Socially Integrated (01/14/2023)   Social Connection and Isolation Panel [NHANES]    Frequency of Communication with Friends and Family: More than three times a week    Frequency of Social Gatherings with Friends and Family: Twice a week    Attends Religious Services: More than 4 times per year    Active Member of [employer] or Organizations: Yes    Attends Engineer, structural: More than 4 times per year    Marital Status: Married   Family History  Problem Relation Age of Onset   Heart failure Mother        age 38   Prostate cancer Father        father died prostate ca   Obstructive Sleep Apnea Brother     Obesity Brother    Colon cancer Paternal Grandmother    Healthy Son    Coronary artery disease Other        1st degree relative<60   Stroke Other        1st degree relative<50   Esophageal cancer Neg Hx    Stomach cancer Neg Hx    Allergies  Allergen Reactions   Nsaids     Do not give to patient  due to hx of stomach bleeds    Losartan Other (See Comments)    weakness   Meloxicam     Caused Stomach Bleeds    Current Outpatient Medications  Medication Sig Dispense Refill   albuterol (VENTOLIN HFA) 108 (90 Base) MCG/ACT inhaler INHALE 2 PUFFS INTO THE LUNGS EVERY 6 HOURS AS NEEDED FOR WHEEZING OR SHORTNESS OF BREATH. 2 each 10   amLODipine (NORVASC) 5 MG tablet Take 1 tablet (5 mg total) by mouth daily. (Patient taking differently: Take 2.5 mg by mouth in the morning and at bedtime.) 90 tablet 1   benzonatate (TESSALON) 100 MG capsule TAKE 1 CAPSULE BY MOUTH TWICE DAILY AS NEEDED FOR COUGH 40 capsule 2   calcium elemental as carbonate (BARIATRIC TUMS ULTRA) 400 MG chewable tablet Chew 1,000 mg by mouth daily.     cetirizine (ZYRTEC) 10 MG tablet Take 10 mg by mouth daily.     fluticasone (FLONASE) 50 MCG/ACT nasal spray USE 2 SPRAYS IN EACH NOSTRIL EVERY DAY 48 g 10   Glucosamine-Chondroitin 750-600 MG TABS Take 1 tablet by mouth 2 (two) times daily.     guaiFENesin (MUCINEX) 600 MG 12 hr tablet Take 600 mg by mouth 2 (two) times daily.     loratadine (CLARITIN) 10 MG tablet Take 10 mg by mouth daily.     Menthol, Topical Analgesic, (BLUE-EMU MAXIMUM STRENGTH EX) Apply 1 application topically daily as needed (arthritis pain).      montelukast (SINGULAIR) 10 MG tablet Take 10 mg by mouth daily.     Multiple Vitamin (MULTIVITAMIN WITH MINERALS) TABS tablet Take 1 tablet by mouth daily.      pantoprazole (PROTONIX) 20 MG tablet Take 1 tablet (20 mg total) by mouth daily. 30 tablet 0   traMADol (ULTRAM) 50 MG tablet Take 1 tablet (50 mg total) by mouth every 12 (twelve) hours as needed. 60  tablet 0   TRELEGY ELLIPTA 100-62.5-25 MCG/ACT AEPB INHALE 1 PUFF INTO THE LUNGS DAILY 180 each 3   triamcinolone cream (KENALOG) 0.1 % Apply 1 Application topically 2 (two) times daily as needed (irritation).     No current facility-administered medications for this visit.   No results found.  Review of Systems:   A ROS was performed including pertinent positives and negatives as documented in the HPI.   Musculoskeletal Exam:    There were no vitals taken for this visit.  Active range of motion of the right shoulder is to 140 degrees forward flexion, 30 degrees external rotation, and internal rotation to L5.  Abduction, ER, and IR strength 5/5.  Neurosensory exam intact.  Right knee range of motion from 0 to 120 degrees.  Some joint line tenderness particularly on the aspect.  Crepitus noted with range of motion.   Imaging:     Assessment:   77 year old male 6 months status post right reverse shoulder arthroplasty.  He does still note some occasional mild pain however he is overall doing very well.  He has excellent range of motion and good strength.  He does plan to return to clinic in a few months to discuss shoulder replacement on his left side toward the end of the year.  His right knee has been been bothering him lately due to known arthritic changes throughout the knee.  He did get some relief with the injection 6 months ago and would like this repeated today.  This was performed with ultrasound guidance in clinic today without complication.  Will have him return  to clinic as needed for the knee and we will plan to see him back in a few months to discuss plan for left shoulder replacement.  Plan :    -Return to clinic in 3 months to discuss left reverse shoulder replacement.     Procedure Note  Patient: [name]             Date of Birth: 03/27/1946           MRN: [id number]             Visit Date: 02/12/2023  Procedures: Visit Diagnoses:  1. History of reverse  total replacement of right shoulder joint   2. Unilateral primary osteoarthritis, right knee     Large Joint Inj: R knee on 02/12/2023 12:28 PM Indications: pain Details: 22 G 1.5 in needle, ultrasound-guided anterolateral approach Medications: 4 mL lidocaine 1 %; 2 mL triamcinolone acetonide 40 MG/ML Outcome: tolerated well, no immediate complications Consent was given by the patient. Patient was prepped and draped in the usual sterile fashion.       I personally saw and evaluated the patient, and participated in the management and treatment plan.  Hazle Nordmann, PA-C Orthopedics  This document was dictated using Conservation officer, historic buildings. A reasonable attempt at proof reading has been made to minimize errors.

## 2023-02-12 NOTE — Telephone Encounter (Signed)
Rx sent 

## 2023-02-12 NOTE — Telephone Encounter (Signed)
Prescription Request  02/12/2023  Is this a "Controlled Substance" medicine? No  LOV: 01/21/2023  What is the name of the medication or equipment?   pantoprazole (PROTONIX) 20 MG tablet [[id number]]   Have you contacted your pharmacy to request a refill? No   Which pharmacy would you like this sent to?   [facility] [address], [place] - [address] [place] [address] Phone: [phone] Fax: [phone]  Patient notified that their request is being sent to the clinical staff for review and that they should receive a response within 2 business days.   Please advise at Mobile [phone] (mobile)

## 2023-02-20 ENCOUNTER — Ambulatory Visit (HOSPITAL_BASED_OUTPATIENT_CLINIC_OR_DEPARTMENT_OTHER)
Admission: RE | Admit: 2023-02-20 | Discharge: 2023-02-20 | Disposition: A | Discharge status: Home / Self Care | Payer: Medicare HMO | Source: Ambulatory Visit | Attending: Family Medicine | Admitting: Family Medicine

## 2023-02-20 DIAGNOSIS — I059 Rheumatic mitral valve disease, unspecified: Secondary | ICD-10-CM | POA: Diagnosis not present

## 2023-02-20 LAB — ECHOCARDIOGRAM COMPLETE
AR max vel: 2.2 cm2
AV Area VTI: 2.21 cm2
AV Area mean vel: 2.3 cm2
AV Mean grad: 4 mmHg
AV Peak grad: 6.9 mmHg
Ao pk vel: 1.32 m/s
Area-P 1/2: 2.68 cm2
Calc EF: 60 %
S' Lateral: 3.1 cm
Single Plane A2C EF: 64.8 %
Single Plane A4C EF: 53.5 %

## 2023-02-24 ENCOUNTER — Other Ambulatory Visit: Payer: Self-pay | Admitting: Family Medicine

## 2023-03-12 ENCOUNTER — Ambulatory Visit (INDEPENDENT_AMBULATORY_CARE_PROVIDER_SITE_OTHER): Payer: Medicare HMO | Admitting: Emergency Medicine

## 2023-03-12 VITALS — Ht 67.0 in | Wt 145.0 lb

## 2023-03-12 DIAGNOSIS — Z0000 Encounter for general adult medical examination without abnormal findings: Secondary | ICD-10-CM | POA: Diagnosis not present

## 2023-03-12 NOTE — Progress Notes (Signed)
Subjective:   [name] is a 77 y.o. male who presents for Medicare Annual/Subsequent preventive examination.  Visit Complete: Virtual  I connected with  [name] on 03/12/23 by a audio enabled telemedicine application and verified that I am speaking with the correct person using two identifiers.  Patient Location: Home  Provider Location: Home Office  I discussed the limitations of evaluation and management by telemedicine. The patient expressed understanding and agreed to proceed.  Patient Medicare AWV questionnaire was completed by the patient on 03/06/23; I have confirmed that all information answered by patient is correct and no changes since this date.  Review of Systems     Cardiac Risk Factors include: advanced age (>41men, >54 women);hypertension;male gender     Objective:    Today's Vitals   03/12/23 0820  Weight: 145 lb (65.8 kg)  Height: 5\' 7"  (1.702 m)   Body mass index is 22.71 kg/m.     03/12/2023    8:36 AM 08/22/2022   10:02 AM 07/19/2022   11:21 AM 03/08/2022    1:03 PM 11/01/2021    8:52 AM 10/30/2021    4:21 PM 02/28/2021    8:23 AM  Advanced Directives  Does Patient Have a Medical Advance Directive? Yes No No Yes  No Yes  Type of Clinical research associate of [place];Living will   Healthcare Power of [place];Living will  Does patient want to make changes to medical advance directive? No - Patient declined   No - Patient declined     Copy of Healthcare Power of Attorney in Chart? Yes - validated most recent copy scanned in chart (See row information)   Yes - validated most recent copy scanned in chart (See row information)   Yes - validated most recent copy scanned in chart (See row information)  Would patient like information on creating a medical advance directive?  No - Patient declined No - Patient declined  Yes (Inpatient - patient defers creating a medical advance directive and declines information  at this time)      Current Medications (verified) Outpatient Encounter Medications as of 03/12/2023  Medication Sig   acetaminophen (TYLENOL) 500 MG tablet Take 1,000 mg by mouth. Takes 1000mg  morning and afternoon and 500mg  at bedtime   albuterol (VENTOLIN HFA) 108 (90 Base) MCG/ACT inhaler INHALE 2 PUFFS INTO THE LUNGS EVERY 6 HOURS AS NEEDED FOR WHEEZING OR SHORTNESS OF BREATH.   amLODipine (NORVASC) 5 MG tablet Take 1 tablet (5 mg total) by mouth daily. (Patient taking differently: Take 2.5 mg by mouth in the morning and at bedtime.)   benzonatate (TESSALON) 100 MG capsule TAKE 1 CAPSULE BY MOUTH TWICE DAILY AS NEEDED FOR COUGH   calcium elemental as carbonate (BARIATRIC TUMS ULTRA) 400 MG chewable tablet Chew 1,000 mg by mouth daily.   cetirizine (ZYRTEC) 10 MG tablet Take 10 mg by mouth daily.   fluticasone (FLONASE) 50 MCG/ACT nasal spray USE 2 SPRAYS IN EACH NOSTRIL EVERY DAY   Glucosamine-Chondroitin 750-600 MG TABS Take 1 tablet by mouth 2 (two) times daily.   guaiFENesin (MUCINEX) 600 MG 12 hr tablet Take 600 mg by mouth 2 (two) times daily.   loratadine (CLARITIN) 10 MG tablet Take 10 mg by mouth daily.   montelukast (SINGULAIR) 10 MG tablet TAKE 1 TABLET EVERY DAY   Multiple Vitamin (MULTIVITAMIN WITH MINERALS) TABS tablet Take 1 tablet by mouth daily.    pantoprazole (PROTONIX) 20 MG tablet Take  1 tablet (20 mg total) by mouth daily.   traMADol (ULTRAM) 50 MG tablet Take 1 tablet (50 mg total) by mouth every 12 (twelve) hours as needed.   TRELEGY ELLIPTA 100-62.5-25 MCG/ACT AEPB INHALE 1 PUFF INTO THE LUNGS DAILY   triamcinolone cream (KENALOG) 0.1 % Apply 1 Application topically 2 (two) times daily as needed (irritation).   Menthol, Topical Analgesic, (BLUE-EMU MAXIMUM STRENGTH EX) Apply 1 application topically daily as needed (arthritis pain).  (Patient not taking: Reported on 03/12/2023)   No facility-administered encounter medications on file as of 03/12/2023.    Allergies  (verified) Nsaids, Losartan, and Meloxicam   History: Past Medical History:  Diagnosis Date   Allergic rhinitis    Atypical chest pain 12/01/2014   Back pain 01/12/2013   COPD (chronic obstructive pulmonary disease) (HCC)    FeV1 64%-2007   Dyspnea    Dysrhythmia    PVCs   Emphysema    GERD (gastroesophageal reflux disease)    History of lung abscess    bronchiectasis with RMLandRLL ersection -1996- Dr Edwyna Shell   Hordeolum externum (stye) 04/23/2015   Right eye   Hypertension    Impaired vision    glasses   Osteoarthritis    hands and knees   Pneumonia    Sinusitis, acute 04/23/2015   Past Surgical History:  Procedure Laterality Date   COLONOSCOPY  11/20/2010   Dr.Stark   ESOPHAGOGASTRODUODENOSCOPY (EGD) WITH PROPOFOL N/A 10/31/2021   Procedure: ESOPHAGOGASTRODUODENOSCOPY (EGD) WITH PROPOFOL;  Surgeon: Iva Boop, MD;  Location: [facility] ENDOSCOPY;  Service: Gastroenterology;  Laterality: N/A;   HEMOSTASIS CONTROL  10/31/2021   Procedure: HEMOSTASIS CONTROL;  Surgeon: Iva Boop, MD;  Location: [facility] ENDOSCOPY;  Service: Gastroenterology;;   HERNIA REPAIR  10/03/2009   HOT HEMOSTASIS N/A 10/31/2021   Procedure: HOT HEMOSTASIS (ARGON PLASMA COAGULATION/BICAP);  Surgeon: Iva Boop, MD;  Location: [facility] ENDOSCOPY;  Service: Gastroenterology;  Laterality: N/A;   left knee     lower back surgery  09/02/2008   06/2009   LUNG SURGERY     RML andRUL removed due to bleeding and bronchiectasis and lung abcess   NECK SURGERY  05/03/2008   REVERSE SHOULDER ARTHROPLASTY Right 08/22/2022   Procedure: RIGHT REVERSE SHOULDER ARTHROPLASTY;  Surgeon: Huel Cote, MD;  Location: [facility] OR;  Service: Orthopedics;  Laterality: Right;   right foot surgery     SCLEROTHERAPY  10/31/2021   Procedure: SCLEROTHERAPY;  Surgeon: Iva Boop, MD;  Location: [facility] ENDOSCOPY;  Service: Gastroenterology;;   TONSILLECTOMY     removed as a child   VIDEO BRONCHOSCOPY N/A 07/12/2020   Procedure:  VIDEO BRONCHOSCOPY;  Surgeon: [name], MD;  Location: [facility] OR;  Service: Thoracic;  Laterality: N/A;   VIDEO BRONCHOSCOPY WITH ENDOBRONCHIAL NAVIGATION N/A 07/12/2020   Procedure: VIDEO BRONCHOSCOPY WITH ENDOBRONCHIAL NAVIGATION;  Surgeon: [name], MD;  Location: [facility] OR;  Service: Thoracic;  Laterality: N/A;   Family History  Problem Relation Age of Onset   Heart failure Mother        age 106   Prostate cancer Father        father died prostate ca   Obstructive Sleep Apnea Brother    Obesity Brother    Colon cancer Paternal Grandmother    Healthy Son    Coronary artery disease Other        1st degree relative<60   Stroke Other        1st degree relative<50   Esophageal cancer  Neg Hx    Stomach cancer Neg Hx    Social History   Socioeconomic History   Marital status: Married    Spouse name: Darcee   Number of children: Not on file   Years of education: Not on file   Highest education level: Bachelor's degree (e.g., BA, AB, BS)  Occupational History   Occupation: retired Librarian, academic  Tobacco Use   Smoking status: Former    Packs/day: 1.50    Years: 38.00    Additional pack years: 0.00    Total pack years: 57.00    Types: Cigarettes    Quit date: 09/03/2000    Years since quitting: 22.5    Passive exposure: Past   Smokeless tobacco: Never  Vaping Use   Vaping Use: Never used  Substance and Sexual Activity   Alcohol use: Yes    Alcohol/week: 1.0 standard drink of alcohol    Types: 1 Cans of beer per week    Comment: 3 or 4 nights weekly   Drug use: No   Sexual activity: Never  Other Topics Concern   Not on file  Social History Narrative   Retired Librarian, academic   Patient states former smoker. 1 1/2 ppd x 38 yrs  Quit in Jan 2002   Married - 2 weeks (4th marriage)   divorced,  remarried 84-2004 (lost wife to lung ca),  remarried (divorced),    1 son  - 83 [name])   Alcohol use-yes (2-3 beers per week)           Social  Determinants of Health   Financial Resource Strain: Low Risk  (03/06/2023)   Overall Financial Resource Strain (CARDIA)    Difficulty of Paying Living Expenses: Not hard at all  Food Insecurity: No Food Insecurity (03/06/2023)   Hunger Vital Sign    Worried About Running Out of Food in the Last Year: Never true    Ran Out of Food in the Last Year: Never true  Transportation Needs: No Transportation Needs (03/06/2023)   PRAPARE - Administrator, Civil Service (Medical): No    Lack of Transportation (Non-Medical): No  Physical Activity: Sufficiently Active (03/06/2023)   Exercise Vital Sign    Days of Exercise per Week: 7 days    Minutes of Exercise per Session: 30 min  Stress: No Stress Concern Present (03/06/2023)   [employer] of Occupational Health - Occupational Stress Questionnaire    Feeling of Stress : Not at all  Social Connections: Socially Integrated (03/06/2023)   Social Connection and Isolation Panel [NHANES]    Frequency of Communication with Friends and Family: Three times a week    Frequency of Social Gatherings with Friends and Family: Twice a week    Attends Religious Services: More than 4 times per year    Active Member of [employer] or Organizations: Yes    Attends Banker Meetings: 1 to 4 times per year    Marital Status: Married    Tobacco Counseling Counseling given: Not Answered   Clinical Intake:              How often do you need to have someone help you when you read instructions, pamphlets, or other written materials from your doctor or pharmacy?: (P) 1 - Never         Activities of Daily Living    03/06/2023    8:20 AM 08/22/2022   10:05 AM  In your present state of health,  do you have any difficulty performing the following activities:  Hearing? 1 0  Vision? 0 0  Difficulty concentrating or making decisions? 0 0  Walking or climbing stairs? 1 0  Dressing or bathing? 0 0  Doing errands, shopping? 0   Preparing Food  and eating ? N   Using the Toilet? N   In the past six months, have you accidently leaked urine? N   Do you have problems with loss of bowel control? N   Managing your Medications? N   Managing your Finances? N   Housekeeping or managing your Housekeeping? N     Patient Care Team: [name], MD as PCP - General (Family Medicine) Wendall Stade, MD as PCP - Cardiology (Cardiology) Storm Frisk, MD as Attending Physician (Pulmonary Disease)  Indicate any recent Medical Services you may have received from other than [facility] providers in the past year (date may be approximate).     Assessment:   This is a routine wellness examination for Neven.  Hearing/Vision screen Hearing Screening - Comments:: Denies hearing difficulties  Dietary issues and exercise activities discussed:     Goals Addressed               This Visit's Progress     maintain healthy lifestyle. (pt-stated)   On track     Depression Screen    03/12/2023    8:38 AM 01/21/2023    2:16 PM 06/13/2022   10:52 AM 03/08/2022    1:04 PM 02/28/2021    8:24 AM 02/28/2020    9:40 AM 12/27/2019    9:53 AM  PHQ 2/9 Scores  PHQ - 2 Score 0 0 0 0 0 0 0  PHQ- 9 Score 0 0 0    0    Fall Risk    03/06/2023    8:20 AM 01/21/2023    2:16 PM 06/13/2022   10:52 AM 03/14/2022   11:48 AM 03/08/2022    1:04 PM  Fall Risk   Falls in the past year? 1 1 1 1 1   Number falls in past yr: 1 0 0 1 1  Injury with Fall? 0 0 1 1 0  Risk for fall due to : History of fall(s);Medication side effect   History of fall(s);Impaired balance/gait;Impaired mobility History of fall(s)  Follow up Falls evaluation completed;Education provided;Falls prevention discussed Falls evaluation completed Falls evaluation completed Falls evaluation completed Falls evaluation completed    MEDICARE RISK AT HOME:   TIMED UP AND GO:  Was the test performed?  No    Cognitive Function:    09/10/2016    1:13 PM  MMSE - Mini Mental State Exam   Orientation to time 5  Orientation to Place 5  Registration 3  Attention/ Calculation 5  Recall 3  Language- name 2 objects 2  Language- repeat 1  Language- follow 3 step command 3  Language- read & follow direction 1  Write a sentence 1  Copy design 1  Total score 30        03/12/2023    8:38 AM 03/08/2022    1:11 PM  6CIT Screen  What Year? 0 points 0 points  What month? 0 points 0 points  What time? 0 points 0 points  Count back from 20 0 points 0 points  Months in reverse 0 points 0 points  Repeat phrase 0 points 0 points  Total Score 0 points 0 points    Immunizations Immunization History  Administered  Date(s) Administered   COVID-19, mRNA, vaccine(Comirnaty)12 years and older 06/13/2022   Fluad Quad(high Dose 65+) 06/13/2022   Influenza Split 06/02/2012   Influenza Whole 05/15/2010, 05/02/2011, 05/18/2013   Influenza, High Dose Seasonal PF 04/20/2017, 05/05/2018, 04/30/2019   Influenza,inj,Quad PF,6+ Mos 05/14/2014, 05/25/2015   Influenza-Unspecified 05/15/2016, 05/23/2021   PFIZER(Purple Top)SARS-COV-2 Vaccination 09/27/2019, 10/18/2019, 05/23/2020   Pfizer Covid-19 Vaccine Bivalent Booster 18yrs & up 05/23/2021   Pneumococcal Conjugate-13 05/20/2013, 05/15/2016   Pneumococcal Polysaccharide-23 07/03/2001, 05/02/2011, 05/20/2018   Td 05/15/2004   Tdap 02/17/2014   Zoster Recombinant(Shingrix) 06/26/2020, 09/20/2020   Zoster, Live 07/16/2011    TDAP status: Up to date  Flu Vaccine status: Up to date  Pneumococcal vaccine status: Up to date  Covid-19 vaccine status: Completed vaccines  Qualifies for Shingles Vaccine? Yes   Zostavax completed No   Shingrix Completed?: Yes  Screening Tests Health Maintenance  Topic Date Due   COVID-19 Vaccine (6 - 2023-24 season) 04/25/2023 (Originally 08/08/2022)   INFLUENZA VACCINE  04/03/2023   DTaP/Tdap/Td (3 - Td or Tdap) 02/18/2024   Medicare Annual Wellness (AWV)  03/11/2024   Pneumonia Vaccine 54+ Years old   Completed   Hepatitis C Screening  Completed   Zoster Vaccines- Shingrix  Completed   HPV VACCINES  Aged Out   Colonoscopy  Discontinued    Health Maintenance  There are no preventive care reminders to display for this patient.   Colorectal cancer screening: No longer required.   Lung Cancer Screening: (Low Dose CT Chest recommended if Age 37-80 years, 20 pack-year currently smoking OR have quit w/in 15years.) does not qualify.   Lung Cancer Screening Referral: n/a  Additional Screening:  Hepatitis C Screening: does qualify; Completed 07/26/15  Vision Screening: Recommended annual ophthalmology exams for early detection of glaucoma and other disorders of the eye. Is the patient up to date with their annual eye exam?  No  Who is the provider or what is the name of the office in which the patient attends annual eye exams? Patient declined If pt is not established with a provider, would they like to be referred to a provider to establish care?  Patient declined .   Dental Screening: Recommended annual dental exams for proper oral hygiene  Diabetic Foot Exam: n/a  Community Resource Referral / Chronic Care Management: CRR required this visit?  No   CCM required this visit?  No     Plan:     I have personally reviewed and noted the following in the patient's chart:   Medical and social history Use of alcohol, tobacco or illicit drugs  Current medications and supplements including opioid prescriptions. Patient is not currently taking opioid prescriptions. Functional ability and status Nutritional status Physical activity Advanced directives List of other physicians Hospitalizations, surgeries, and ER visits in previous 12 months Vitals Screenings to include cognitive, depression, and falls Referrals and appointments  In addition, I have reviewed and discussed with patient certain preventive protocols, quality metrics, and best practice recommendations. A written  personalized care plan for preventive services as well as general preventive health recommendations were provided to patient.     Tora Kindred, CMA   03/12/2023   After Visit Summary: (MyChart) Due to this being a telephonic visit, the after visit summary with patients personalized plan was offered to patient via MyChart   Nurse Notes: none

## 2023-03-12 NOTE — Patient Instructions (Signed)
[name] , Thank you for taking time to come for your Medicare Wellness Visit. I appreciate your ongoing commitment to your health goals. Please review the following plan we discussed and let me know if I can assist you in the future.   These are the goals we discussed:  Goals       maintain healthy lifestyle. (pt-stated)        This is a list of the screening recommended for you and due dates:  Health Maintenance  Topic Date Due   COVID-19 Vaccine (6 - 2023-24 season) 04/25/2023*   Flu Shot  04/03/2023   DTaP/Tdap/Td vaccine (3 - Td or Tdap) 02/18/2024   Medicare Annual Wellness Visit  03/11/2024   Pneumonia Vaccine  Completed   Hepatitis C Screening  Completed   Zoster (Shingles) Vaccine  Completed   HPV Vaccine  Aged Out   Colon Cancer Screening  Discontinued  *Topic was postponed. The date shown is not the original due date.    Advanced directives: copy in chart    Conditions/risks identified: Keep up the good work!  Next appointment: Follow up in one year for your annual wellness visit. 03/16/24  Preventive Care 65 Years and Older, Male  Preventive care refers to lifestyle choices and visits with your health care provider that can promote health and wellness. What does preventive care include? A yearly physical exam. This is also called an annual well check. Dental exams once or twice a year. Routine eye exams. Ask your health care provider how often you should have your eyes checked. Personal lifestyle choices, including: Daily care of your teeth and gums. Regular physical activity. Eating a healthy diet. Avoiding tobacco and drug use. Limiting alcohol use. Practicing safe sex. Taking low doses of aspirin every day. Taking vitamin and mineral supplements as recommended by your health care provider. What happens during an annual well check? The services and screenings done by your health care provider during your annual well check will depend on your age, overall  health, lifestyle risk factors, and family history of disease. Counseling  Your health care provider may ask you questions about your: Alcohol use. Tobacco use. Drug use. Emotional well-being. Home and relationship well-being. Sexual activity. Eating habits. History of falls. Memory and ability to understand (cognition). Work and work Astronomer. Screening  You may have the following tests or measurements: Height, weight, and BMI. Blood pressure. Lipid and cholesterol levels. These may be checked every 5 years, or more frequently if you are over 76 years old. Skin check. Lung cancer screening. You may have this screening every year starting at age 76 if you have a 30-pack-year history of smoking and currently smoke or have quit within the past 15 years. Fecal occult blood test (FOBT) of the stool. You may have this test every year starting at age 76. Flexible sigmoidoscopy or colonoscopy. You may have a sigmoidoscopy every 5 years or a colonoscopy every 10 years starting at age 76. Prostate cancer screening. Recommendations will vary depending on your family history and other risks. Hepatitis C blood test. Hepatitis B blood test. Sexually transmitted disease (STD) testing. Diabetes screening. This is done by checking your blood sugar (glucose) after you have not eaten for a while (fasting). You may have this done every 1-3 years. Abdominal aortic aneurysm (AAA) screening. You may need this if you are a current or former smoker. Osteoporosis. You may be screened starting at age 76 if you are at high risk. Talk with your  health care provider about your test results, treatment options, and if necessary, the need for more tests. Vaccines  Your health care provider may recommend certain vaccines, such as: Influenza vaccine. This is recommended every year. Tetanus, diphtheria, and acellular pertussis (Tdap, Td) vaccine. You may need a Td booster every 10 years. Zoster vaccine. You may  need this after age 45. Pneumococcal 13-valent conjugate (PCV13) vaccine. One dose is recommended after age 76. Pneumococcal polysaccharide (PPSV23) vaccine. One dose is recommended after age 76. Talk to your health care provider about which screenings and vaccines you need and how often you need them. This information is not intended to replace advice given to you by your health care provider. Make sure you discuss any questions you have with your health care provider. Document Released: 09/15/2015 Document Revised: 05/08/2016 Document Reviewed: 06/20/2015 Elsevier Interactive Patient Education  2017 [employer].  Fall Prevention in the Home Falls can cause injuries. They can happen to people of all ages. There are many things you can do to make your home safe and to help prevent falls. What can I do on the outside of my home? Regularly fix the edges of walkways and driveways and fix any cracks. Remove anything that might make you trip as you walk through a door, such as a raised step or threshold. Trim any bushes or trees on the path to your home. Use bright outdoor lighting. Clear any walking paths of anything that might make someone trip, such as rocks or tools. Regularly check to see if handrails are loose or broken. Make sure that both sides of any steps have handrails. Any raised decks and porches should have guardrails on the edges. Have any leaves, snow, or ice cleared regularly. Use sand or salt on walking paths during winter. Clean up any spills in your garage right away. This includes oil or grease spills. What can I do in the bathroom? Use night lights. Install grab bars by the toilet and in the tub and shower. Do not use towel bars as grab bars. Use non-skid mats or decals in the tub or shower. If you need to sit down in the shower, use a plastic, non-slip stool. Keep the floor dry. Clean up any water that spills on the floor as soon as it happens. Remove soap buildup in  the tub or shower regularly. Attach bath mats securely with double-sided non-slip rug tape. Do not have throw rugs and other things on the floor that can make you trip. What can I do in the bedroom? Use night lights. Make sure that you have a light by your bed that is easy to reach. Do not use any sheets or blankets that are too big for your bed. They should not hang down onto the floor. Have a firm chair that has side arms. You can use this for support while you get dressed. Do not have throw rugs and other things on the floor that can make you trip. What can I do in the kitchen? Clean up any spills right away. Avoid walking on wet floors. Keep items that you use a lot in easy-to-reach places. If you need to reach something above you, use a strong step stool that has a grab bar. Keep electrical cords out of the way. Do not use floor polish or wax that makes floors slippery. If you must use wax, use non-skid floor wax. Do not have throw rugs and other things on the floor that can make you trip. What  can I do with my stairs? Do not leave any items on the stairs. Make sure that there are handrails on both sides of the stairs and use them. Fix handrails that are broken or loose. Make sure that handrails are as long as the stairways. Check any carpeting to make sure that it is firmly attached to the stairs. Fix any carpet that is loose or worn. Avoid having throw rugs at the top or bottom of the stairs. If you do have throw rugs, attach them to the floor with carpet tape. Make sure that you have a light switch at the top of the stairs and the bottom of the stairs. If you do not have them, ask someone to add them for you. What else can I do to help prevent falls? Wear shoes that: Do not have high heels. Have rubber bottoms. Are comfortable and fit you well. Are closed at the toe. Do not wear sandals. If you use a stepladder: Make sure that it is fully opened. Do not climb a closed  stepladder. Make sure that both sides of the stepladder are locked into place. Ask someone to hold it for you, if possible. Clearly mark and make sure that you can see: Any grab bars or handrails. First and last steps. Where the edge of each step is. Use tools that help you move around (mobility aids) if they are needed. These include: Canes. Walkers. Scooters. Crutches. Turn on the lights when you go into a dark area. Replace any light bulbs as soon as they burn out. Set up your furniture so you have a clear path. Avoid moving your furniture around. If any of your floors are uneven, fix them. If there are any pets around you, be aware of where they are. Review your medicines with your doctor. Some medicines can make you feel dizzy. This can increase your chance of falling. Ask your doctor what other things that you can do to help prevent falls. This information is not intended to replace advice given to you by your health care provider. Make sure you discuss any questions you have with your health care provider. Document Released: 06/15/2009 Document Revised: 01/25/2016 Document Reviewed: 09/23/2014 Elsevier Interactive Patient Education  2017 [employer].

## 2023-04-15 ENCOUNTER — Other Ambulatory Visit: Payer: Self-pay | Admitting: Thoracic Surgery (Cardiothoracic Vascular Surgery)

## 2023-04-15 DIAGNOSIS — R911 Solitary pulmonary nodule: Secondary | ICD-10-CM

## 2023-05-01 ENCOUNTER — Encounter: Payer: Self-pay | Admitting: Thoracic Surgery (Cardiothoracic Vascular Surgery)

## 2023-05-02 ENCOUNTER — Other Ambulatory Visit: Payer: Self-pay | Admitting: Family Medicine

## 2023-05-13 ENCOUNTER — Ambulatory Visit: Payer: Medicare HMO | Admitting: Thoracic Surgery (Cardiothoracic Vascular Surgery)

## 2023-05-13 ENCOUNTER — Other Ambulatory Visit: Payer: Self-pay | Admitting: Family Medicine

## 2023-05-13 ENCOUNTER — Encounter: Payer: Self-pay | Admitting: Thoracic Surgery (Cardiothoracic Vascular Surgery)

## 2023-05-13 ENCOUNTER — Ambulatory Visit
Admission: RE | Admit: 2023-05-13 | Discharge: 2023-05-13 | Disposition: A | Discharge status: Home / Self Care | Payer: Medicare HMO | Source: Ambulatory Visit | Attending: Thoracic Surgery (Cardiothoracic Vascular Surgery) | Admitting: Thoracic Surgery (Cardiothoracic Vascular Surgery)

## 2023-05-13 VITALS — BP 175/80 | HR 45 | Resp 20 | Ht 67.0 in | Wt 139.0 lb

## 2023-05-13 DIAGNOSIS — I700 Atherosclerosis of aorta: Secondary | ICD-10-CM | POA: Diagnosis not present

## 2023-05-13 DIAGNOSIS — R911 Solitary pulmonary nodule: Secondary | ICD-10-CM | POA: Diagnosis not present

## 2023-05-13 DIAGNOSIS — I2510 Atherosclerotic heart disease of native coronary artery without angina pectoris: Secondary | ICD-10-CM | POA: Diagnosis not present

## 2023-05-13 DIAGNOSIS — R918 Other nonspecific abnormal finding of lung field: Secondary | ICD-10-CM | POA: Diagnosis not present

## 2023-05-13 NOTE — Progress Notes (Signed)
[address]       [name] [id number]             [phone]     HPI: [name] returns for follow-up of a right lung nodule  [name] is a 77 year old man with a history of tobacco abuse, COPD, right bilobectomy for bronchiectasis and hemoptysis, recurrent hemoptysis, hypertension, gastrointestinal bleeding, reflux, arthritis, and a lung nodule.  He had a bilobectomy for bronchiectasis and hemoptysis in 1996.  He presented back with recurrent hemoptysis in 2021.  There is a cystic area in the right superior chest.  It was unclear if this was a lung mass or pleural-based.  Navigational bronchoscopy was performed.  There was no evidence of malignancy.  He grew staph and Acinetobacter.  He was treated with antibiotics.  He is hemoptysis ultimately resolved about 9 months later.  I last saw him in the office in March.  He was doing well at that time with no hemoptysis but did have a new 5 mm lung nodule.  In the interim since his last visit he has been feeling well.  No hemoptysis.  No fevers, chills, or sweats.  He is going fishing a couple of times a week.  Past Medical History:  Diagnosis Date   Allergic rhinitis    Atypical chest pain 12/01/2014   Back pain 01/12/2013   COPD (chronic obstructive pulmonary disease) (HCC)    FeV1 64%-2007   Dyspnea    Dysrhythmia    PVCs   Emphysema    GERD (gastroesophageal reflux disease)    History of lung abscess    bronchiectasis with RMLandRLL ersection -1996- Dr Edwyna Shell   Hordeolum externum (stye) 04/23/2015   Right eye   Hypertension    Impaired vision    glasses   Osteoarthritis    hands and knees   Pneumonia    Sinusitis, acute 04/23/2015    Current Outpatient Medications  Medication Sig Dispense Refill   acetaminophen (TYLENOL) 500 MG tablet Take 1,000 mg by mouth. Takes 1000mg  morning and afternoon and 500mg  at bedtime     albuterol (VENTOLIN HFA) 108 (90 Base) MCG/ACT inhaler INHALE 2 PUFFS  INTO THE LUNGS EVERY 6 HOURS AS NEEDED FOR WHEEZING OR SHORTNESS OF BREATH. 2 each 10   benzonatate (TESSALON) 100 MG capsule TAKE 1 CAPSULE BY MOUTH TWICE DAILY AS NEEDED FOR COUGH 40 capsule 0   calcium elemental as carbonate (BARIATRIC TUMS ULTRA) 400 MG chewable tablet Chew 1,000 mg by mouth daily.     cetirizine (ZYRTEC) 10 MG tablet Take 10 mg by mouth daily.     fluticasone (FLONASE) 50 MCG/ACT nasal spray USE 2 SPRAYS IN EACH NOSTRIL EVERY DAY 48 g 10   Glucosamine-Chondroitin 750-600 MG TABS Take 1 tablet by mouth 2 (two) times daily.     guaiFENesin (MUCINEX) 600 MG 12 hr tablet Take 600 mg by mouth 2 (two) times daily.     loratadine (CLARITIN) 10 MG tablet Take 10 mg by mouth daily.     Menthol, Topical Analgesic, (BLUE-EMU MAXIMUM STRENGTH EX) Apply 1 application  topically daily as needed (arthritis pain).     montelukast (SINGULAIR) 10 MG tablet TAKE 1 TABLET EVERY DAY 90 tablet 3   Multiple Vitamin (MULTIVITAMIN WITH MINERALS) TABS tablet Take 1 tablet by mouth daily.      pantoprazole (PROTONIX) 20 MG tablet Take 1 tablet (20 mg total) by mouth daily. 90 tablet 1  traMADol (ULTRAM) 50 MG tablet TAKE 1 TABLET BY MOUTH EVERY 12 HOURS AS NEEDED 60 tablet 0   TRELEGY ELLIPTA 100-62.5-25 MCG/ACT AEPB INHALE 1 PUFF INTO THE LUNGS DAILY 180 each 3   triamcinolone cream (KENALOG) 0.1 % Apply 1 Application topically 2 (two) times daily as needed (irritation).     amLODipine (NORVASC) 5 MG tablet TAKE 1 TABLET EVERY DAY 90 tablet 3   No current facility-administered medications for this visit.    Physical Exam BP (!) 175/80   Pulse (!) 45   Resp 20   Ht 5\' 7"  (1.702 m)   Wt 139 lb (63 kg)   SpO2 95% Comment: RA  BMI 21.11 kg/m  77 year old man in no acute distress Alert and oriented x 3 with no focal deficits Lungs diminished on right but clear with no wheezing No cervical or supraclavicular adenopathy Cardiac slightly irregular, normal rate (88 bpm manual pulse) No  peripheral edema  Diagnostic Tests: CT reading is pending. I reviewed the CT images.  There are postoperative changes on the right and chronic scarring bilaterally.  Previously 5 mm nodule measures about 2.5 mm.  Emphysema.  Aortic atherosclerosis.  Impression: [name] is a 77 year old man with a history of tobacco abuse, COPD, right bilobectomy for bronchiectasis and hemoptysis, recurrent hemoptysis, hypertension, gastrointestinal bleeding, reflux, arthritis, and a lung nodule.  Right lung nodule-smaller on today's exam.  Consistent with inflammatory nodule or lymph node.  Await radiology's interpretation.  History of tobacco use-quit in 2002.  Greater than a 30-pack-year history of smoking.  Qualifies for low-dose CT screening.  Will have him return in a year with a CT chest, unless radiology report indicates need for sooner follow-up.  Blood pressure machine indicated a heart rate of 45.  Manual check revealed a heart rate of 88 with irregularity.  He has had a known irregular heartbeat for years.  Hypertension-follow-up with Dr. Rogelia Rohrer  Plan: Follow-up on official radiology report Return in 1 year with low-dose CT of chest for lung cancer screening  I spent over 20 minutes in review of records, images, and in consultation with [name] today. [name], MD [facility] [phone]

## 2023-05-23 ENCOUNTER — Ambulatory Visit (HOSPITAL_BASED_OUTPATIENT_CLINIC_OR_DEPARTMENT_OTHER): Payer: Medicare HMO | Admitting: Orthopaedic Surgery

## 2023-05-23 ENCOUNTER — Other Ambulatory Visit (HOSPITAL_BASED_OUTPATIENT_CLINIC_OR_DEPARTMENT_OTHER): Payer: Self-pay

## 2023-05-23 DIAGNOSIS — M12812 Other specific arthropathies, not elsewhere classified, left shoulder: Secondary | ICD-10-CM

## 2023-05-23 DIAGNOSIS — Z96611 Presence of right artificial shoulder joint: Secondary | ICD-10-CM

## 2023-05-23 DIAGNOSIS — M75102 Unspecified rotator cuff tear or rupture of left shoulder, not specified as traumatic: Secondary | ICD-10-CM | POA: Diagnosis not present

## 2023-05-23 MED ORDER — ASPIRIN 325 MG PO TBEC: 325.0000 mg | DELAYED_RELEASE_TABLET | Freq: Every day | ORAL | 0 refills | Status: DC

## 2023-05-23 MED ORDER — COVID-19 MRNA VAC-TRIS(PFIZER) 30 MCG/0.3ML IM SUSY
0.3000 mL | PREFILLED_SYRINGE | Freq: Once | INTRAMUSCULAR | 0 refills | Status: AC
  Filled 2023-05-23: qty 0.3, 1d supply, fill #0

## 2023-05-23 MED ORDER — ACETAMINOPHEN 500 MG PO TABS: 500.0000 mg | ORAL_TABLET | Freq: Three times a day (TID) | ORAL | 0 refills | Status: AC

## 2023-05-23 MED ORDER — OXYCODONE HCL 5 MG PO TABS: 5.0000 mg | ORAL_TABLET | ORAL | 0 refills | Status: DC | PRN

## 2023-05-23 MED ORDER — INFLUENZA VAC A&B SURF ANT ADJ 0.5 ML IM SUSY
0.5000 mL | PREFILLED_SYRINGE | Freq: Once | INTRAMUSCULAR | 0 refills | Status: AC
  Filled 2023-05-23: qty 0.5, 1d supply, fill #0

## 2023-05-23 NOTE — Progress Notes (Signed)
Post Operative Evaluation    Procedure/Date of Surgery: Right shoulder reverse shoulder arthroplasty 12/21  Interval History:   Presents today status post the above procedure.  He is doing extremely well with regard to the right side.  Range of motion is doing much better and he is now able to finish quite well with the right.  He is experiencing persistent limited overhead activity on the left.  He is experiencing persistent pain and swelling.  He has been doing physical therapy for the both the right and the left without any relief on the left.   PMH/PSH/Family History/Social History/Meds/Allergies:    Past Medical History:  Diagnosis Date   Allergic rhinitis    Atypical chest pain 12/01/2014   Back pain 01/12/2013   COPD (chronic obstructive pulmonary disease) (HCC)    FeV1 64%-2007   Dyspnea    Dysrhythmia    PVCs   Emphysema    GERD (gastroesophageal reflux disease)    History of lung abscess    bronchiectasis with RMLandRLL ersection -1996- Dr Edwyna Shell   Hordeolum externum (stye) 04/23/2015   Right eye   Hypertension    Impaired vision    glasses   Osteoarthritis    hands and knees   Pneumonia    Sinusitis, acute 04/23/2015   Past Surgical History:  Procedure Laterality Date   COLONOSCOPY  11/20/2010   Dr.Stark   ESOPHAGOGASTRODUODENOSCOPY (EGD) WITH PROPOFOL N/A 10/31/2021   Procedure: ESOPHAGOGASTRODUODENOSCOPY (EGD) WITH PROPOFOL;  Surgeon: Iva Boop, MD;  Location: [facility] ENDOSCOPY;  Service: Gastroenterology;  Laterality: N/A;   HEMOSTASIS CONTROL  10/31/2021   Procedure: HEMOSTASIS CONTROL;  Surgeon: Iva Boop, MD;  Location: [facility] ENDOSCOPY;  Service: Gastroenterology;;   HERNIA REPAIR  10/03/2009   HOT HEMOSTASIS N/A 10/31/2021   Procedure: HOT HEMOSTASIS (ARGON PLASMA COAGULATION/BICAP);  Surgeon: Iva Boop, MD;  Location: [facility] ENDOSCOPY;  Service: Gastroenterology;  Laterality: N/A;   left knee     lower back  surgery  09/02/2008   06/2009   LUNG SURGERY     RML andRUL removed due to bleeding and bronchiectasis and lung abcess   NECK SURGERY  05/03/2008   REVERSE SHOULDER ARTHROPLASTY Right 08/22/2022   Procedure: RIGHT REVERSE SHOULDER ARTHROPLASTY;  Surgeon: Huel Cote, MD;  Location: [facility] OR;  Service: Orthopedics;  Laterality: Right;   right foot surgery     SCLEROTHERAPY  10/31/2021   Procedure: SCLEROTHERAPY;  Surgeon: Iva Boop, MD;  Location: [facility] ENDOSCOPY;  Service: Gastroenterology;;   TONSILLECTOMY     removed as a child   VIDEO BRONCHOSCOPY N/A 07/12/2020   Procedure: VIDEO BRONCHOSCOPY;  Surgeon: [name], MD;  Location: [facility] OR;  Service: Thoracic;  Laterality: N/A;   VIDEO BRONCHOSCOPY WITH ENDOBRONCHIAL NAVIGATION N/A 07/12/2020   Procedure: VIDEO BRONCHOSCOPY WITH ENDOBRONCHIAL NAVIGATION;  Surgeon: [name], MD;  Location: [facility] OR;  Service: Thoracic;  Laterality: N/A;   Social History   Socioeconomic History   Marital status: Married    Spouse name: Darcee   Number of children: Not on file   Years of education: Not on file   Highest education level: Bachelor's degree (e.g., BA, AB, BS)  Occupational History   Occupation: retired Librarian, academic  Tobacco Use   Smoking status: Former    Current packs/day: 0.00  Average packs/day: 1.5 packs/day for 38.0 years (57.0 ttl pk-yrs)    Types: Cigarettes    Start date: 09/03/1962    Quit date: 09/03/2000    Years since quitting: 22.7    Passive exposure: Past   Smokeless tobacco: Never  Vaping Use   Vaping status: Never Used  Substance and Sexual Activity   Alcohol use: Yes    Alcohol/week: 1.0 standard drink of alcohol    Types: 1 Cans of beer per week    Comment: 3 or 4 nights weekly   Drug use: No   Sexual activity: Never  Other Topics Concern   Not on file  Social History Narrative   Retired Librarian, academic   Patient states former smoker. 1 1/2 ppd x 38 yrs  Quit in Jan  2002   Married - 2 weeks (4th marriage)   divorced,  remarried 84-2004 (lost wife to lung ca),  remarried (divorced),    1 son  - 49 [name])   Alcohol use-yes (2-3 beers per week)           Social Determinants of Health   Financial Resource Strain: Low Risk  (03/06/2023)   Overall Financial Resource Strain (CARDIA)    Difficulty of Paying Living Expenses: Not hard at all  Food Insecurity: No Food Insecurity (03/06/2023)   Hunger Vital Sign    Worried About Running Out of Food in the Last Year: Never true    Ran Out of Food in the Last Year: Never true  Transportation Needs: No Transportation Needs (03/06/2023)   PRAPARE - Administrator, Civil Service (Medical): No    Lack of Transportation (Non-Medical): No  Physical Activity: Sufficiently Active (03/06/2023)   Exercise Vital Sign    Days of Exercise per Week: 7 days    Minutes of Exercise per Session: 30 min  Stress: No Stress Concern Present (03/06/2023)   [employer] of Occupational Health - Occupational Stress Questionnaire    Feeling of Stress : Not at all  Social Connections: Socially Integrated (03/06/2023)   Social Connection and Isolation Panel [NHANES]    Frequency of Communication with Friends and Family: Three times a week    Frequency of Social Gatherings with Friends and Family: Twice a week    Attends Religious Services: More than 4 times per year    Active Member of [employer] or Organizations: Yes    Attends Engineer, structural: 1 to 4 times per year    Marital Status: Married   Family History  Problem Relation Age of Onset   Heart failure Mother        age 37   Prostate cancer Father        father died prostate ca   Obstructive Sleep Apnea Brother    Obesity Brother    Colon cancer Paternal Grandmother    Healthy Son    Coronary artery disease Other        1st degree relative<60   Stroke Other        1st degree relative<50   Esophageal cancer Neg Hx    Stomach cancer Neg Hx     Allergies  Allergen Reactions   Nsaids     Do not give to patient due to hx of stomach bleeds    Losartan Other (See Comments)    weakness   Meloxicam     Caused Stomach Bleeds    Current Outpatient Medications  Medication Sig Dispense Refill   acetaminophen (TYLENOL)  500 MG tablet Take 1 tablet (500 mg total) by mouth every 8 (eight) hours for 10 days. 30 tablet 0   aspirin EC 325 MG tablet Take 1 tablet (325 mg total) by mouth daily. 14 tablet 0   oxyCODONE (ROXICODONE) 5 MG immediate release tablet Take 1 tablet (5 mg total) by mouth every 4 (four) hours as needed for severe pain or breakthrough pain. 15 tablet 0   acetaminophen (TYLENOL) 500 MG tablet Take 1,000 mg by mouth. Takes 1000mg  morning and afternoon and 500mg  at bedtime     albuterol (VENTOLIN HFA) 108 (90 Base) MCG/ACT inhaler INHALE 2 PUFFS INTO THE LUNGS EVERY 6 HOURS AS NEEDED FOR WHEEZING OR SHORTNESS OF BREATH. 2 each 10   amLODipine (NORVASC) 5 MG tablet TAKE 1 TABLET EVERY DAY 90 tablet 3   benzonatate (TESSALON) 100 MG capsule TAKE 1 CAPSULE BY MOUTH TWICE DAILY AS NEEDED FOR COUGH 40 capsule 0   calcium elemental as carbonate (BARIATRIC TUMS ULTRA) 400 MG chewable tablet Chew 1,000 mg by mouth daily.     cetirizine (ZYRTEC) 10 MG tablet Take 10 mg by mouth daily.     COVID-19 mRNA vaccine, Pfizer, (COMIRNATY) syringe Inject 0.3 mLs into the muscle once for 1 dose. 0.3 mL 0   fluticasone (FLONASE) 50 MCG/ACT nasal spray USE 2 SPRAYS IN EACH NOSTRIL EVERY DAY 48 g 10   Glucosamine-Chondroitin 750-600 MG TABS Take 1 tablet by mouth 2 (two) times daily.     guaiFENesin (MUCINEX) 600 MG 12 hr tablet Take 600 mg by mouth 2 (two) times daily.     influenza vaccine adjuvanted (FLUAD) 0.5 ML injection Inject 0.5 mLs into the muscle once for 1 dose. 0.5 mL 0   loratadine (CLARITIN) 10 MG tablet Take 10 mg by mouth daily.     Menthol, Topical Analgesic, (BLUE-EMU MAXIMUM STRENGTH EX) Apply 1 application  topically daily as  needed (arthritis pain).     montelukast (SINGULAIR) 10 MG tablet TAKE 1 TABLET EVERY DAY 90 tablet 3   Multiple Vitamin (MULTIVITAMIN WITH MINERALS) TABS tablet Take 1 tablet by mouth daily.      pantoprazole (PROTONIX) 20 MG tablet Take 1 tablet (20 mg total) by mouth daily. 90 tablet 1   traMADol (ULTRAM) 50 MG tablet TAKE 1 TABLET BY MOUTH EVERY 12 HOURS AS NEEDED 60 tablet 0   TRELEGY ELLIPTA 100-62.5-25 MCG/ACT AEPB INHALE 1 PUFF INTO THE LUNGS DAILY 180 each 3   triamcinolone cream (KENALOG) 0.1 % Apply 1 Application topically 2 (two) times daily as needed (irritation).     No current facility-administered medications for this visit.   No results found.  Review of Systems:   A ROS was performed including pertinent positives and negatives as documented in the HPI.   Musculoskeletal Exam:    There were no vitals taken for this visit.  Right shoulder incision is well-appearing without erythema or drainage.  Range of motion in the supine position is to 90 degrees forward elevation and 20 degrees external rotation.  Internal rotation deferred today.  Distal neurosensory exam is intact.  2+ radial pulse.  Left shoulder range of motion is from 0 to 80 degrees actively with abduction to 70 degrees with pain.  External rotation at side is to neutral.  Internal rotation is to back pocket.  There is positive crepitus and pain about the shoulder  Imaging:    3 views left shoulder x-ray: There is evidence of left shoulder rotator cuff arthropathy with  complete dissolution of the acromiohumeral interval  I personally reviewed and interpreted the radiographs.   Assessment:   77 year old male doing extremely well status post right shoulder reverse shoulder arthroplasty.  Today's visit he is having extremely limited range of motion as well as pain in the left shoulder.  Given this we did discuss treatment options.  He has done physical therapy without any relief.  At this time I did discuss  additional options including left reverse shoulder arthroplasty.  We did discuss the risks and limitations.  He would like to proceed with this. Plan :    -Plan for left reverse shoulder arthroplasty   After a lengthy discussion of treatment options, including risks, benefits, alternatives, complications of surgical and nonsurgical conservative options, the patient elected surgical repair.   The patient  is aware of the material risks  and complications including, but not limited to injury to adjacent structures, neurovascular injury, infection, numbness, bleeding, implant failure, thermal burns, stiffness, persistent pain, failure to heal, disease transmission from allograft, need for further surgery, dislocation, anesthetic risks, blood clots, risks of death,and others. The probabilities of surgical success and failure discussed with patient given their particular co-morbidities.The time and nature of expected rehabilitation and recovery was discussed.The patient's questions were all answered preoperatively.  No barriers to understanding were noted. I explained the natural history of the disease process and Rx rationale.  I explained to the patient what I considered to be reasonable expectations given their personal situation.  The final treatment plan was arrived at through a shared patient decision making process model.       I personally saw and evaluated the patient, and participated in the management and treatment plan.  Huel Cote, MD Attending Physician, Orthopedic Surgery  This document was dictated using Dragon voice recognition software. A reasonable attempt at proof reading has been made to minimize errors.

## 2023-05-26 ENCOUNTER — Ambulatory Visit (INDEPENDENT_AMBULATORY_CARE_PROVIDER_SITE_OTHER): Payer: Medicare HMO | Admitting: Family Medicine

## 2023-05-26 ENCOUNTER — Ambulatory Visit: Payer: Medicare HMO | Admitting: Family

## 2023-05-26 ENCOUNTER — Encounter: Payer: Self-pay | Admitting: Family Medicine

## 2023-05-26 VITALS — BP 131/42 | HR 85 | Ht 67.0 in | Wt 149.0 lb

## 2023-05-26 DIAGNOSIS — E785 Hyperlipidemia, unspecified: Secondary | ICD-10-CM

## 2023-05-26 DIAGNOSIS — R739 Hyperglycemia, unspecified: Secondary | ICD-10-CM

## 2023-05-26 DIAGNOSIS — I493 Ventricular premature depolarization: Secondary | ICD-10-CM

## 2023-05-26 DIAGNOSIS — J432 Centrilobular emphysema: Secondary | ICD-10-CM

## 2023-05-26 DIAGNOSIS — Z01818 Encounter for other preprocedural examination: Secondary | ICD-10-CM

## 2023-05-26 LAB — CBC WITH DIFFERENTIAL/PLATELET
Basophils Absolute: 0 10*3/uL (ref 0.0–0.1)
Basophils Relative: 0.7 % (ref 0.0–3.0)
Eosinophils Absolute: 0.1 10*3/uL (ref 0.0–0.7)
Eosinophils Relative: 2 % (ref 0.0–5.0)
HCT: 45.2 % (ref 39.0–52.0)
Hemoglobin: 14.6 g/dL (ref 13.0–17.0)
Lymphocytes Relative: 17 % (ref 12.0–46.0)
Lymphs Abs: 1.1 10*3/uL (ref 0.7–4.0)
MCHC: 32.4 g/dL (ref 30.0–36.0)
MCV: 95.6 fl (ref 78.0–100.0)
Monocytes Absolute: 0.5 10*3/uL (ref 0.1–1.0)
Monocytes Relative: 7.4 % (ref 3.0–12.0)
Neutro Abs: 4.5 10*3/uL (ref 1.4–7.7)
Neutrophils Relative %: 72.9 % (ref 43.0–77.0)
Platelets: 250 10*3/uL (ref 150.0–400.0)
RBC: 4.73 Mil/uL (ref 4.22–5.81)
RDW: 13.3 % (ref 11.5–15.5)
WBC: 6.2 10*3/uL (ref 4.0–10.5)

## 2023-05-26 LAB — COMPREHENSIVE METABOLIC PANEL
ALT: 16 U/L (ref 0–53)
AST: 22 U/L (ref 0–37)
Albumin: 4 g/dL (ref 3.5–5.2)
Alkaline Phosphatase: 76 U/L (ref 39–117)
BUN: 24 mg/dL — ABNORMAL HIGH (ref 6–23)
CO2: 25 mEq/L (ref 19–32)
Calcium: 9.3 mg/dL (ref 8.4–10.5)
Chloride: 103 mEq/L (ref 96–112)
Creatinine, Ser: 0.82 mg/dL (ref 0.40–1.50)
GFR: 85.3 mL/min (ref >60.00)
Glucose, Bld: 97 mg/dL (ref 70–99)
Potassium: 4.4 mEq/L (ref 3.5–5.1)
Sodium: 138 mEq/L (ref 135–145)
Total Bilirubin: 0.7 mg/dL (ref 0.2–1.2)
Total Protein: 6.9 g/dL (ref 6.0–8.3)

## 2023-05-26 LAB — TSH: TSH: 0.64 u[IU]/mL (ref 0.35–5.50)

## 2023-05-26 LAB — LIPID PANEL
Cholesterol: 157 mg/dL (ref 0–200)
HDL: 51.4 mg/dL (ref >39.00)
LDL Cholesterol: 74 mg/dL (ref 0–99)
NonHDL: 105.96
Total CHOL/HDL Ratio: 3
Triglycerides: 159 mg/dL — ABNORMAL HIGH (ref 0.0–149.0)
VLDL: 31.8 mg/dL (ref 0.0–40.0)

## 2023-05-26 LAB — EKG 12-LEAD

## 2023-05-26 LAB — HEMOGLOBIN A1C: Hgb A1c MFr Bld: 5.9 % (ref 4.6–6.5)

## 2023-05-26 NOTE — Progress Notes (Signed)
Subjective:      HPI: Pt is a 77 y.o. male who is here for preoperative clearance for left reverse shoulder arthroplasty with Dr. Steward Drone (hoping for November).  1) High Risk Cardiac Conditions:  1) Recent MI - No.  2) Decompensated Heart Failure - No.  3) Unstable angina - No.  4) Symptomatic arrythmia - No.  5) Sx Valvular Disease - No.  2) Intermediate Risk Factors: DM, CKD, CVA, CHF, CAD - Yes, Hypertension, COPD  2) Functional Status: > 4 mets (Walk, run, climb stairs) Yes.  Kateri Mc Activity Status Index: 45.45  3) Surgery Specific Risk: High (Emergency, Vascular, Intra-abdominal, Extensive ops)          Intermediate (Carotid, Head and Neck, Orthopaedic)          Low (Endoscopic, Cataract, Breast )  4) Further Noninvasive evaluation:   1) EKG - Yes.     Hx of HTN, PVCs  2) Echo - No.   Stable June 2024  3) Stress Testing - Active Cardiac Disease - Defer to cardiology (Dr. Eden Emms)  4) CXR No.   Following with cardiothoracic team with annual CT chest. Most recently done 05/13/23 showing RML 2mm nodule, though to be benign postinflammatory or infection, new RUL nodule (6mm) and LLL nodule (3 mm) - 3-6 month repeat CT recommended; coronary artery calcifications, aortic atherosclerosis, and emphysema   5)PFTs - defer to cardiothoracic surgery (Dr. Dorris Fetch)   COPD, right bilobectomy 1996  5) Need for medical therapy - Beta Blocker, Statins indicated ? No.  New complaints: No new concerns.   Social history:  Relevant past medical, surgical, family and social history reviewed and updated as indicated. Interim medical history since our last visit reviewed.  Allergies and medications reviewed and updated.  DATA REVIEWED: CHART IN EPIC  ROS: Negative unless specifically indicated above in HPI.    Current Outpatient Medications:    acetaminophen (TYLENOL) 500 MG tablet, Take 1,000 mg by mouth. Takes 1000mg  morning and afternoon and 500mg  at bedtime, Disp: , Rfl:     acetaminophen (TYLENOL) 500 MG tablet, Take 1 tablet (500 mg total) by mouth every 8 (eight) hours for 10 days., Disp: 30 tablet, Rfl: 0   albuterol (VENTOLIN HFA) 108 (90 Base) MCG/ACT inhaler, INHALE 2 PUFFS INTO THE LUNGS EVERY 6 HOURS AS NEEDED FOR WHEEZING OR SHORTNESS OF BREATH., Disp: 2 each, Rfl: 10   amLODipine (NORVASC) 5 MG tablet, TAKE 1 TABLET EVERY DAY, Disp: 90 tablet, Rfl: 3   aspirin EC 325 MG tablet, Take 1 tablet (325 mg total) by mouth daily., Disp: 14 tablet, Rfl: 0   benzonatate (TESSALON) 100 MG capsule, TAKE 1 CAPSULE BY MOUTH TWICE DAILY AS NEEDED FOR COUGH, Disp: 40 capsule, Rfl: 0   calcium elemental as carbonate (BARIATRIC TUMS ULTRA) 400 MG chewable tablet, Chew 1,000 mg by mouth daily., Disp: , Rfl:    cetirizine (ZYRTEC) 10 MG tablet, Take 10 mg by mouth daily., Disp: , Rfl:    fluticasone (FLONASE) 50 MCG/ACT nasal spray, USE 2 SPRAYS IN EACH NOSTRIL EVERY DAY, Disp: 48 g, Rfl: 10   Glucosamine-Chondroitin 750-600 MG TABS, Take 1 tablet by mouth 2 (two) times daily., Disp: , Rfl:    guaiFENesin (MUCINEX) 600 MG 12 hr tablet, Take 600 mg by mouth 2 (two) times daily., Disp: , Rfl:    loratadine (CLARITIN) 10 MG tablet, Take 10 mg by mouth daily., Disp: , Rfl:    Menthol, Topical Analgesic, (BLUE-EMU MAXIMUM  STRENGTH EX), Apply 1 application  topically daily as needed (arthritis pain)., Disp: , Rfl:    montelukast (SINGULAIR) 10 MG tablet, TAKE 1 TABLET EVERY DAY, Disp: 90 tablet, Rfl: 3   Multiple Vitamin (MULTIVITAMIN WITH MINERALS) TABS tablet, Take 1 tablet by mouth daily. , Disp: , Rfl:    pantoprazole (PROTONIX) 20 MG tablet, Take 1 tablet (20 mg total) by mouth daily., Disp: 90 tablet, Rfl: 1   traMADol (ULTRAM) 50 MG tablet, TAKE 1 TABLET BY MOUTH EVERY 12 HOURS AS NEEDED, Disp: 60 tablet, Rfl: 0   TRELEGY ELLIPTA 100-62.5-25 MCG/ACT AEPB, INHALE 1 PUFF INTO THE LUNGS DAILY, Disp: 180 each, Rfl: 3   triamcinolone cream (KENALOG) 0.1 %, Apply 1 Application  topically 2 (two) times daily as needed (irritation)., Disp: , Rfl:       Objective:    BP (!) 131/42   Pulse 85   Ht 5\' 7"  (1.702 m)   Wt 149 lb (67.6 kg)   SpO2 97%   BMI 23.34 kg/m   Wt Readings from Last 3 Encounters:  05/26/23 149 lb (67.6 kg)  05/13/23 139 lb (63 kg)  03/12/23 145 lb (65.8 kg)    Physical Exam Vitals reviewed.  Constitutional:      Appearance: Normal appearance.  HENT:     Head: Normocephalic and atraumatic.     Mouth/Throat:     Mouth: Mucous membranes are moist.     Pharynx: Oropharynx is clear. No posterior oropharyngeal erythema.  Cardiovascular:     Rate and Rhythm: Normal rate. Rhythm irregular.  Pulmonary:     Effort: Pulmonary effort is normal.     Breath sounds: No wheezing, rhonchi or rales.  Musculoskeletal:     Cervical back: Normal range of motion and neck supple.  Skin:    General: Skin is warm and dry.     Coloration: Skin is not pale.  Neurological:     General: No focal deficit present.     Mental Status: He is alert and oriented to person, place, and time. Mental status is at baseline.  Psychiatric:        Mood and Affect: Mood normal.        Behavior: Behavior normal.        Thought Content: Thought content normal.        Judgment: Judgment normal.    EKG: SR 91 with PVC/trigeminy, LAE, incomplete RBBB     Assessment & Plan:  Preop examination -     EKG 12-Lead -     CBC with Differential/Platelet -     Comprehensive metabolic panel -     Hemoglobin A1c -     TSH -     Lipid panel  Asymptomatic PVCs -     Comprehensive metabolic panel -     TSH  Hyperglycemia -     Comprehensive metabolic panel -     Hemoglobin A1c  Hyperlipidemia, unspecified hyperlipidemia type -     Comprehensive metabolic panel -     Lipid panel  Centrilobular emphysema (HCC)    I have independently evaluated patient.  EFFREM DIBB is a 77 y.o. male who is mod risk for an intermediate risk surgery.  There are not modifiable  risk factors  Becker S Rossman's RCRI/NSQIP calculation for MACE is: 0.    Given his history, recommend cardiology and cardiothoracic surgery sign off as well prior to surgery. Reviewed with PCP, Dr. Abner Greenspan.   Return in about 3 months (  around 08/25/2023) for routine follow-up, PCP.  Lollie Marrow Reola Calkins, DNP, FNP-C

## 2023-05-27 ENCOUNTER — Telehealth: Payer: Self-pay | Admitting: *Deleted

## 2023-05-27 ENCOUNTER — Telehealth: Payer: Self-pay

## 2023-05-27 NOTE — Telephone Encounter (Signed)
  Patient Consent for Virtual Visit        [name] has provided verbal consent on 05/27/2023 for a virtual visit (video or telephone).   CONSENT FOR VIRTUAL VISIT FOR:  [name]  By participating in this virtual visit I agree to the following:  I hereby voluntarily request, consent and authorize [facility] and its employed or contracted physicians, physician assistants, nurse practitioners or other licensed health care professionals (the Practitioner), to provide me with telemedicine health care services (the "Services") as deemed necessary by the treating Practitioner. I acknowledge and consent to receive the Services by the Practitioner via telemedicine. I understand that the telemedicine visit will involve communicating with the Practitioner through live audiovisual communication technology and the disclosure of certain medical information by electronic transmission. I acknowledge that I have been given the opportunity to request an in-person assessment or other available alternative prior to the telemedicine visit and am voluntarily participating in the telemedicine visit.  I understand that I have the right to withhold or withdraw my consent to the use of telemedicine in the course of my care at any time, without affecting my right to future care or treatment, and that the Practitioner or I may terminate the telemedicine visit at any time. I understand that I have the right to inspect all information obtained and/or recorded in the course of the telemedicine visit and may receive copies of available information for a reasonable fee.  I understand that some of the potential risks of receiving the Services via telemedicine include:  Delay or interruption in medical evaluation due to technological equipment failure or disruption; Information transmitted may not be sufficient (e.g. poor resolution of images) to allow for appropriate medical decision making by the  Practitioner; and/or  In rare instances, security protocols could fail, causing a breach of personal health information.  Furthermore, I acknowledge that it is my responsibility to provide information about my medical history, conditions and care that is complete and accurate to the best of my ability. I acknowledge that Practitioner's advice, recommendations, and/or decision may be based on factors not within their control, such as incomplete or inaccurate data provided by me or distortions of diagnostic images or specimens that may result from electronic transmissions. I understand that the practice of medicine is not an exact science and that Practitioner makes no warranties or guarantees regarding treatment outcomes. I acknowledge that a copy of this consent can be made available to me via my patient portal [facility] MyChart), or I can request a printed copy by calling the office of [facility].    I understand that my insurance will be billed for this visit.   I have read or had this consent read to me. I understand the contents of this consent, which adequately explains the benefits and risks of the Services being provided via telemedicine.  I have been provided ample opportunity to ask questions regarding this consent and the Services and have had my questions answered to my satisfaction. I give my informed consent for the services to be provided through the use of telemedicine in my medical care

## 2023-05-27 NOTE — Telephone Encounter (Signed)
Spoke with patient who is agreeable to do tele visit on 10/15 at 10 am. Med rec and consent done.

## 2023-05-27 NOTE — Telephone Encounter (Signed)
Name: [name]  DOB: [birth year]  MRN: [id number]  Primary Cardiologist: Charlton Haws, MD   Preoperative team, please contact this patient and set up a phone call appointment for further preoperative risk assessment. Please obtain consent and complete medication review. Thank you for your help.  I confirm that guidance regarding antiplatelet and oral anticoagulation therapy has been completed and, if necessary, noted below.   Marcelino Duster, PA 05/27/2023, 7:04 AM [facility]

## 2023-05-27 NOTE — Telephone Encounter (Signed)
Pre-operative Risk Assessment    Patient Name: [name]  DOB: 16-Jul-1946 MRN: [id number]      Request for Surgical Clearance    Procedure:   LEFT REVERSE SHOULDER ARTHROPLASTY  Date of Surgery:  Clearance TBD                                 Surgeon:  Huel Cote, MD Surgeon's Group or Practice Name:  [facility] Phone number:  [phone] Fax number:  [phone]   Type of Clearance Requested:   - Medical  - Pharmacy:  Hold Aspirin NOT INDICATED   Type of Anesthesia:  General    Additional requests/questions:    [name]   05/27/2023, 6:44 AM

## 2023-05-27 NOTE — Telephone Encounter (Signed)
Patient states he would like me to call him back to scheduled a tele visit for pre-op clearance.

## 2023-05-28 NOTE — Progress Notes (Signed)
I faxed a clearance request to patients Cardiologist and Cardiothoracic doctor. 05/27/23.

## 2023-06-07 ENCOUNTER — Ambulatory Visit (HOSPITAL_BASED_OUTPATIENT_CLINIC_OR_DEPARTMENT_OTHER)
Admission: RE | Admit: 2023-06-07 | Discharge: 2023-06-07 | Disposition: A | Discharge status: Home / Self Care | Payer: Medicare HMO | Source: Ambulatory Visit | Attending: Orthopaedic Surgery | Admitting: Orthopaedic Surgery

## 2023-06-07 DIAGNOSIS — M75122 Complete rotator cuff tear or rupture of left shoulder, not specified as traumatic: Secondary | ICD-10-CM | POA: Diagnosis not present

## 2023-06-07 DIAGNOSIS — M12812 Other specific arthropathies, not elsewhere classified, left shoulder: Secondary | ICD-10-CM | POA: Insufficient documentation

## 2023-06-07 DIAGNOSIS — Z96611 Presence of right artificial shoulder joint: Secondary | ICD-10-CM | POA: Diagnosis not present

## 2023-06-07 DIAGNOSIS — M75102 Unspecified rotator cuff tear or rupture of left shoulder, not specified as traumatic: Secondary | ICD-10-CM | POA: Diagnosis not present

## 2023-06-07 DIAGNOSIS — M2540 Effusion, unspecified joint: Secondary | ICD-10-CM | POA: Diagnosis not present

## 2023-06-14 ENCOUNTER — Other Ambulatory Visit: Payer: Self-pay | Admitting: Family

## 2023-06-17 ENCOUNTER — Ambulatory Visit: Payer: Medicare HMO | Attending: Nurse Practitioner | Admitting: Nurse Practitioner

## 2023-06-17 ENCOUNTER — Encounter: Payer: Self-pay | Admitting: Nurse Practitioner

## 2023-06-17 DIAGNOSIS — Z01810 Encounter for preprocedural cardiovascular examination: Secondary | ICD-10-CM

## 2023-06-17 NOTE — Progress Notes (Signed)
Virtual Visit via Telephone Note   Because of [name]'s co-morbid illnesses, he is at least at moderate risk for complications without adequate follow up.  This format is felt to be most appropriate for this patient at this time.  The patient did not have access to video technology/had technical difficulties with video requiring transitioning to audio format only (telephone).  All issues noted in this document were discussed and addressed.  No physical exam could be performed with this format.  Please refer to the patient's chart for his consent to telehealth for [facility].  Evaluation Performed:  Preoperative cardiovascular risk assessment _____________   Date:  06/17/2023   Patient ID:  [name], DOB Jan 09, 1946, MRN [id number] Patient Location:  Home Provider location:   Office  Primary Care Provider:  [name], MD Primary Cardiologist:  Charlton Haws, MD  Chief Complaint / Patient Profile   77 y.o. y/o male with a h/o COPD, lung abscess in 1996 s/p RML/RLL resection, hypertension, PVCs, 57-pack-year history of smoking who is pending left reverse shoulder arthroplasty and presents today for telephonic preoperative cardiovascular risk assessment.  History of Present Illness    [name] is a 77 y.o. male who presents via audio/video conferencing for a telehealth visit today.  Pt was last seen in cardiology clinic on 07/29/22 by Jari Favre, PA.  At that time [name] was doing well.  The patient is now pending procedure as outlined above. Since his last visit, he denies chest pain, lower extremity edema, fatigue, palpitations, melena, hematuria, hemoptysis, diaphoresis, weakness, presyncope, syncope, orthopnea, and PND. He reports chronic shortness of breath secondary to lung resection and COPD. He is limited by knee pain that prevents him from running but he is otherwise very active with dog walking 20-30 minutes daily, yard work, and fishing.  He can achieve > 4 METS activity without concerning cardiac symptoms.   Past Medical History    Past Medical History:  Diagnosis Date   Allergic rhinitis    Atypical chest pain 12/01/2014   Back pain 01/12/2013   COPD (chronic obstructive pulmonary disease) (HCC)    FeV1 64%-2007   Dyspnea    Dysrhythmia    PVCs   Emphysema    GERD (gastroesophageal reflux disease)    History of lung abscess    bronchiectasis with RMLandRLL ersection -1996- Dr Edwyna Shell   Hordeolum externum (stye) 04/23/2015   Right eye   Hypertension    Impaired vision    glasses   Osteoarthritis    hands and knees   Pneumonia    Sinusitis, acute 04/23/2015   Past Surgical History:  Procedure Laterality Date   COLONOSCOPY  11/20/2010   Dr.Stark   ESOPHAGOGASTRODUODENOSCOPY (EGD) WITH PROPOFOL N/A 10/31/2021   Procedure: ESOPHAGOGASTRODUODENOSCOPY (EGD) WITH PROPOFOL;  Surgeon: Iva Boop, MD;  Location: [facility] ENDOSCOPY;  Service: Gastroenterology;  Laterality: N/A;   HEMOSTASIS CONTROL  10/31/2021   Procedure: HEMOSTASIS CONTROL;  Surgeon: Iva Boop, MD;  Location: [facility] ENDOSCOPY;  Service: Gastroenterology;;   HERNIA REPAIR  10/03/2009   HOT HEMOSTASIS N/A 10/31/2021   Procedure: HOT HEMOSTASIS (ARGON PLASMA COAGULATION/BICAP);  Surgeon: Iva Boop, MD;  Location: [facility] ENDOSCOPY;  Service: Gastroenterology;  Laterality: N/A;   left knee     lower back surgery  09/02/2008   06/2009   LUNG SURGERY     RML andRUL removed due to bleeding and bronchiectasis and lung abcess   NECK SURGERY  05/03/2008  REVERSE SHOULDER ARTHROPLASTY Right 08/22/2022   Procedure: RIGHT REVERSE SHOULDER ARTHROPLASTY;  Surgeon: Huel Cote, MD;  Location: [facility] OR;  Service: Orthopedics;  Laterality: Right;   right foot surgery     SCLEROTHERAPY  10/31/2021   Procedure: SCLEROTHERAPY;  Surgeon: Iva Boop, MD;  Location: [facility] ENDOSCOPY;  Service: Gastroenterology;;   TONSILLECTOMY     removed as a child   VIDEO  BRONCHOSCOPY N/A 07/12/2020   Procedure: VIDEO BRONCHOSCOPY;  Surgeon: [name], MD;  Location: [facility] OR;  Service: Thoracic;  Laterality: N/A;   VIDEO BRONCHOSCOPY WITH ENDOBRONCHIAL NAVIGATION N/A 07/12/2020   Procedure: VIDEO BRONCHOSCOPY WITH ENDOBRONCHIAL NAVIGATION;  Surgeon: [name], MD;  Location: [facility] OR;  Service: Thoracic;  Laterality: N/A;    Allergies  Allergies  Allergen Reactions   Nsaids     Do not give to patient due to hx of stomach bleeds    Losartan Other (See Comments)    weakness   Meloxicam     Caused Stomach Bleeds     Home Medications    Prior to Admission medications   Medication Sig Start Date End Date Taking? Authorizing Provider  acetaminophen (TYLENOL) 500 MG tablet Take 1,000 mg by mouth. Takes 1000mg  morning and afternoon and 500mg  at bedtime    [provider]  albuterol (VENTOLIN HFA) 108 (90 Base) MCG/ACT inhaler INHALE 2 PUFFS INTO THE LUNGS EVERY 6 HOURS AS NEEDED FOR WHEEZING OR SHORTNESS OF BREATH. 06/24/22   [name], MD  amLODipine (NORVASC) 5 MG tablet TAKE 1 TABLET EVERY DAY 05/13/23   [name], MD  aspirin EC 325 MG tablet Take 1 tablet (325 mg total) by mouth daily. 05/23/23   Huel Cote, MD  benzonatate (TESSALON) 100 MG capsule TAKE 1 CAPSULE BY MOUTH TWICE DAILY AS NEEDED FOR COUGH 05/13/23   Worthy Rancher B, FNP  calcium elemental as carbonate (BARIATRIC TUMS ULTRA) 400 MG chewable tablet Chew 1,000 mg by mouth daily.    [provider]  cetirizine (ZYRTEC) 10 MG tablet Take 10 mg by mouth daily.    [provider]  fluticasone (FLONASE) 50 MCG/ACT nasal spray USE 2 SPRAYS IN [facility] NOSTRIL EVERY DAY 06/24/22   [name], MD  Glucosamine-Chondroitin 750-600 MG TABS Take 1 tablet by mouth 2 (two) times daily.    [provider]  guaiFENesin (MUCINEX) 600 MG 12 hr tablet Take 600 mg by mouth 2 (two) times daily.    [provider]  loratadine (CLARITIN)  10 MG tablet Take 10 mg by mouth daily.    [provider]  Menthol, Topical Analgesic, (BLUE-EMU MAXIMUM STRENGTH EX) Apply 1 application  topically daily as needed (arthritis pain).    [provider]  montelukast (SINGULAIR) 10 MG tablet TAKE 1 TABLET EVERY DAY 02/24/23   [name], MD  Multiple Vitamin (MULTIVITAMIN WITH MINERALS) TABS tablet Take 1 tablet by mouth daily.     [provider]  pantoprazole (PROTONIX) 20 MG tablet Take 1 tablet (20 mg total) by mouth daily. 02/12/23   [name], MD  traMADol (ULTRAM) 50 MG tablet TAKE 1 TABLET BY MOUTH EVERY 12 HOURS AS NEEDED 05/02/23   [name], MD  TRELEGY ELLIPTA 100-62.5-25 MCG/ACT AEPB INHALE 1 PUFF INTO THE LUNGS DAILY 01/28/23   [name], MD  triamcinolone cream (KENALOG) 0.1 % Apply 1 Application topically 2 (two) times daily as needed (irritation). 03/11/22   [provider]  Physical Exam    Vital Signs:  RAYDYN HEWEY does not have vital signs available for review today.  Given telephonic nature of communication, physical exam is limited. AAOx3. NAD. Normal affect.  Speech and respirations are unlabored.  Accessory Clinical Findings    None  Assessment & Plan    1.  Preoperative Cardiovascular Risk Assessment: According to the Revised Cardiac Risk Index (RCRI), his Perioperative Risk of Major Cardiac Event is (%): 0.4. His Functional Capacity in METs is: 6.61 according to the Duke Activity Status Index (DASI). The patient is doing well from a cardiac perspective. Therefore, based on ACC/AHA guidelines, the patient would be at acceptable risk for the planned procedure without further cardiovascular testing.   The patient was advised that if he develops new symptoms prior to surgery to contact our office to arrange for a follow-up visit, and he verbalized understanding.  No request to hold cardiac medications.   A copy of this note will be routed to requesting  surgeon.  Time:   Today, I have spent 10 minutes with the patient with telehealth technology discussing medical history, symptoms, and management plan.    Levi Aland, NP-C  06/17/2023, 11:18 AM [address] Office [phone] Fax [phone]

## 2023-07-09 ENCOUNTER — Other Ambulatory Visit: Payer: Self-pay | Admitting: Family Medicine

## 2023-07-15 NOTE — Telephone Encounter (Signed)
Patient needs to be seen. Hasn't been seen for cough since 12/23

## 2023-07-15 NOTE — Telephone Encounter (Signed)
Pt called to follow up on med refill. Advised a message would be sent back and we would look into sending it in .

## 2023-07-16 ENCOUNTER — Ambulatory Visit: Payer: Medicare HMO | Admitting: Physician Assistant

## 2023-07-16 ENCOUNTER — Encounter: Payer: Self-pay | Admitting: Physician Assistant

## 2023-07-16 VITALS — BP 156/82 | HR 84 | Temp 97.6°F | Ht 67.0 in | Wt 151.2 lb

## 2023-07-16 DIAGNOSIS — J432 Centrilobular emphysema: Secondary | ICD-10-CM | POA: Diagnosis not present

## 2023-07-16 DIAGNOSIS — I10 Essential (primary) hypertension: Secondary | ICD-10-CM | POA: Diagnosis not present

## 2023-07-16 MED ORDER — BENZONATATE 100 MG PO CAPS
ORAL_CAPSULE | ORAL | 0 refills | Status: DC
Start: 2023-07-16 — End: 2023-09-05

## 2023-07-16 NOTE — Assessment & Plan Note (Signed)
No recent exacerbation, refilled tessalon Cont trellegy, albuterol prn, singulair.  If current mild congestion does not resolve pt aware to contact office.

## 2023-07-16 NOTE — Assessment & Plan Note (Signed)
Chronic, elevated in office. Pt manages with amlodipine 5 mg  Pt is hesitant to make changes today Advised our goal is <140 / 90. He will monitor at home and contact if consistently elevated.

## 2023-07-16 NOTE — Progress Notes (Signed)
Established patient visit   Patient: [name]   DOB: 1945/12/04   77 y.o. Male  MRN: [id number] Visit Date: 07/16/2023  Today's healthcare provider: Alfredia Ferguson, PA-C   Cc. Med refill  Subjective    Pt reports he is out of tessalon, he takes this nearly every day to control his COPD. Reports slight chest congestion today, but no significant change in baseline symptoms. He is consistent w/ trelegy, singulair, and albuterol prn.   He has an upcoming L shoulder surgery and he wants to make sure he is clear before then.   Medications: Outpatient Medications Prior to Visit  Medication Sig   acetaminophen (TYLENOL) 500 MG tablet Take 1,000 mg by mouth. Takes 1000mg  morning and afternoon and 500mg  at bedtime   albuterol (VENTOLIN HFA) 108 (90 Base) MCG/ACT inhaler INHALE 2 PUFFS INTO THE LUNGS EVERY 6 HOURS AS NEEDED FOR WHEEZING OR SHORTNESS OF BREATH.   amLODipine (NORVASC) 5 MG tablet TAKE 1 TABLET EVERY DAY   calcium elemental as carbonate (BARIATRIC TUMS ULTRA) 400 MG chewable tablet Chew 1,000 mg by mouth daily.   cetirizine (ZYRTEC) 10 MG tablet Take 10 mg by mouth daily.   fluticasone (FLONASE) 50 MCG/ACT nasal spray USE 2 SPRAYS IN EACH NOSTRIL EVERY DAY   Glucosamine-Chondroitin 750-600 MG TABS Take 1 tablet by mouth 2 (two) times daily.   guaiFENesin (MUCINEX) 600 MG 12 hr tablet Take 600 mg by mouth 2 (two) times daily.   loratadine (CLARITIN) 10 MG tablet Take 10 mg by mouth daily.   Menthol, Topical Analgesic, (BLUE-EMU MAXIMUM STRENGTH EX) Apply 1 application  topically daily as needed (arthritis pain).   montelukast (SINGULAIR) 10 MG tablet TAKE 1 TABLET EVERY DAY   Multiple Vitamin (MULTIVITAMIN WITH MINERALS) TABS tablet Take 1 tablet by mouth daily.    pantoprazole (PROTONIX) 20 MG tablet Take 1 tablet (20 mg total) by mouth daily.   traMADol (ULTRAM) 50 MG tablet TAKE 1 TABLET BY MOUTH EVERY 12 HOURS AS NEEDED   TRELEGY ELLIPTA 100-62.5-25 MCG/ACT AEPB  INHALE 1 PUFF INTO THE LUNGS DAILY   triamcinolone cream (KENALOG) 0.1 % Apply 1 Application topically 2 (two) times daily as needed (irritation).   [DISCONTINUED] benzonatate (TESSALON) 100 MG capsule TAKE 1 CAPSULE BY MOUTH TWICE DAILY AS NEEDED FOR COUGH   [DISCONTINUED] aspirin EC 325 MG tablet Take 1 tablet (325 mg total) by mouth daily.   No facility-administered medications prior to visit.    Review of Systems  Constitutional:  Negative for fatigue and fever.  Respiratory:  Negative for cough and shortness of breath.   Cardiovascular:  Negative for chest pain, palpitations and leg swelling.  Neurological:  Negative for dizziness and headaches.       Objective    BP (!) 156/82 (BP Location: Left Arm, Patient Position: Sitting, Cuff Size: Normal)   Pulse 84   Temp 97.6 F (36.4 C) (Oral)   Ht 5\' 7"  (1.702 m)   Wt 151 lb 4 oz (68.6 kg)   SpO2 95%   BMI 23.69 kg/m    Physical Exam Constitutional:      General: He is awake.     Appearance: He is well-developed.  HENT:     Head: Normocephalic.  Eyes:     Conjunctiva/sclera: Conjunctivae normal.  Cardiovascular:     Rate and Rhythm: Normal rate and regular rhythm.     Heart sounds: Normal heart sounds.  Pulmonary:     Effort: Pulmonary  effort is normal.     Breath sounds: Normal breath sounds. No wheezing, rhonchi or rales.     Comments: Dec breath sound on the right, normal per pt given right lobe removal Skin:    General: Skin is warm.  Neurological:     Mental Status: He is alert and oriented to person, place, and time.  Psychiatric:        Attention and Perception: Attention normal.        Mood and Affect: Mood normal.        Speech: Speech normal.        Behavior: Behavior is cooperative.     No results found for any visits on 07/16/23.  Assessment & Plan    Centrilobular emphysema (HCC) Assessment & Plan: No recent exacerbation, refilled tessalon Cont trellegy, albuterol prn, singulair.  If current  mild congestion does not resolve pt aware to contact office.  Orders: -     Benzonatate; TAKE 1 CAPSULE BY MOUTH TWICE DAILY AS NEEDED FOR COUGH  Dispense: 90 capsule; Refill: 0  Benign essential HTN Assessment & Plan: Chronic, elevated in office. Pt manages with amlodipine 5 mg  Pt is hesitant to make changes today Advised our goal is <140 / 90. He will monitor at home and contact if consistently elevated.      Return if symptoms worsen or fail to improve, for annual appt made with pcp.       Alfredia Ferguson, PA-C  [facility] at [facility] [phone] (phone) [phone] (fax)  [facility]

## 2023-07-21 ENCOUNTER — Ambulatory Visit: Payer: Medicare HMO | Admitting: Physician Assistant

## 2023-07-21 ENCOUNTER — Ambulatory Visit: Payer: Medicare HMO | Admitting: Family Medicine

## 2023-07-21 DIAGNOSIS — Z85820 Personal history of malignant melanoma of skin: Secondary | ICD-10-CM | POA: Diagnosis not present

## 2023-07-21 DIAGNOSIS — D229 Melanocytic nevi, unspecified: Secondary | ICD-10-CM | POA: Diagnosis not present

## 2023-07-21 DIAGNOSIS — L821 Other seborrheic keratosis: Secondary | ICD-10-CM | POA: Diagnosis not present

## 2023-07-21 DIAGNOSIS — L570 Actinic keratosis: Secondary | ICD-10-CM | POA: Diagnosis not present

## 2023-07-21 DIAGNOSIS — L814 Other melanin hyperpigmentation: Secondary | ICD-10-CM | POA: Diagnosis not present

## 2023-07-21 DIAGNOSIS — Z85828 Personal history of other malignant neoplasm of skin: Secondary | ICD-10-CM | POA: Diagnosis not present

## 2023-07-21 DIAGNOSIS — L218 Other seborrheic dermatitis: Secondary | ICD-10-CM | POA: Diagnosis not present

## 2023-07-24 ENCOUNTER — Other Ambulatory Visit: Payer: Self-pay

## 2023-07-24 ENCOUNTER — Encounter (HOSPITAL_BASED_OUTPATIENT_CLINIC_OR_DEPARTMENT_OTHER): Payer: Self-pay | Admitting: Orthopaedic Surgery

## 2023-08-02 ENCOUNTER — Other Ambulatory Visit: Payer: Self-pay | Admitting: Family Medicine

## 2023-08-04 ENCOUNTER — Telehealth: Payer: Self-pay | Admitting: *Deleted

## 2023-08-04 NOTE — Care Plan (Signed)
[facility] RNCM call to patient to discuss his Left Reverse Shoulder arthroplasty with Dr. Steward Drone at [facility] on 08/05/23. He is agreeable to case management. His plan is to return home on day of surgery with his wife, who will be assisting him. He has already scheduled his OPPT at [facility] in [place] next week. Reviewed post op instructions and answered questions. Will continue to follow for needs.

## 2023-08-04 NOTE — Telephone Encounter (Signed)
Ortho bundle pre-op call completed. 

## 2023-08-05 ENCOUNTER — Encounter (HOSPITAL_BASED_OUTPATIENT_CLINIC_OR_DEPARTMENT_OTHER): Payer: Self-pay | Admitting: Orthopaedic Surgery

## 2023-08-05 ENCOUNTER — Other Ambulatory Visit: Payer: Self-pay

## 2023-08-05 ENCOUNTER — Ambulatory Visit (HOSPITAL_BASED_OUTPATIENT_CLINIC_OR_DEPARTMENT_OTHER): Payer: Medicare HMO | Admitting: Anesthesiology

## 2023-08-05 ENCOUNTER — Ambulatory Visit (HOSPITAL_BASED_OUTPATIENT_CLINIC_OR_DEPARTMENT_OTHER)
Admission: RE | Admit: 2023-08-05 | Discharge: 2023-08-05 | Disposition: A | Discharge status: Home / Self Care | Payer: Medicare HMO | Attending: Orthopaedic Surgery | Admitting: Orthopaedic Surgery

## 2023-08-05 ENCOUNTER — Other Ambulatory Visit: Payer: Self-pay | Admitting: Orthopaedic Surgery

## 2023-08-05 ENCOUNTER — Ambulatory Visit (HOSPITAL_COMMUNITY): Payer: Medicare HMO

## 2023-08-05 ENCOUNTER — Ambulatory Visit: Payer: Self-pay | Admitting: Orthopaedic Surgery

## 2023-08-05 ENCOUNTER — Encounter (HOSPITAL_BASED_OUTPATIENT_CLINIC_OR_DEPARTMENT_OTHER)
Admission: RE | Disposition: A | Discharge status: Home / Self Care | Payer: Self-pay | Source: Home / Self Care | Attending: Orthopaedic Surgery

## 2023-08-05 DIAGNOSIS — M12812 Other specific arthropathies, not elsewhere classified, left shoulder: Secondary | ICD-10-CM

## 2023-08-05 DIAGNOSIS — Z7982 Long term (current) use of aspirin: Secondary | ICD-10-CM | POA: Insufficient documentation

## 2023-08-05 DIAGNOSIS — I10 Essential (primary) hypertension: Secondary | ICD-10-CM | POA: Diagnosis not present

## 2023-08-05 DIAGNOSIS — Z79899 Other long term (current) drug therapy: Secondary | ICD-10-CM | POA: Diagnosis not present

## 2023-08-05 DIAGNOSIS — D649 Anemia, unspecified: Secondary | ICD-10-CM | POA: Diagnosis not present

## 2023-08-05 DIAGNOSIS — M19012 Primary osteoarthritis, left shoulder: Secondary | ICD-10-CM | POA: Diagnosis not present

## 2023-08-05 DIAGNOSIS — K279 Peptic ulcer, site unspecified, unspecified as acute or chronic, without hemorrhage or perforation: Secondary | ICD-10-CM | POA: Insufficient documentation

## 2023-08-05 DIAGNOSIS — J439 Emphysema, unspecified: Secondary | ICD-10-CM | POA: Diagnosis not present

## 2023-08-05 DIAGNOSIS — K219 Gastro-esophageal reflux disease without esophagitis: Secondary | ICD-10-CM | POA: Insufficient documentation

## 2023-08-05 DIAGNOSIS — Z96612 Presence of left artificial shoulder joint: Secondary | ICD-10-CM | POA: Diagnosis not present

## 2023-08-05 DIAGNOSIS — Z87891 Personal history of nicotine dependence: Secondary | ICD-10-CM | POA: Diagnosis not present

## 2023-08-05 DIAGNOSIS — M75102 Unspecified rotator cuff tear or rupture of left shoulder, not specified as traumatic: Secondary | ICD-10-CM | POA: Diagnosis not present

## 2023-08-05 DIAGNOSIS — Z01818 Encounter for other preprocedural examination: Secondary | ICD-10-CM

## 2023-08-05 DIAGNOSIS — G8918 Other acute postprocedural pain: Secondary | ICD-10-CM | POA: Diagnosis not present

## 2023-08-05 HISTORY — PX: REVERSE SHOULDER ARTHROPLASTY: SHX5054

## 2023-08-05 SURGERY — ARTHROPLASTY, SHOULDER, TOTAL, REVERSE
Anesthesia: General | Site: Shoulder | Laterality: Left

## 2023-08-05 MED ORDER — VANCOMYCIN HCL 1000 MG IV SOLR
INTRAVENOUS | Status: DC | PRN
  Administered 2023-08-05: 1000 mg via TOPICAL

## 2023-08-05 MED ORDER — MIDAZOLAM HCL 2 MG/2ML IJ SOLN
INTRAMUSCULAR | Status: AC
  Filled 2023-08-05: qty 2

## 2023-08-05 MED ORDER — GABAPENTIN 300 MG PO CAPS
ORAL_CAPSULE | ORAL | Status: AC
  Filled 2023-08-05: qty 1

## 2023-08-05 MED ORDER — LIDOCAINE 2% (20 MG/ML) 5 ML SYRINGE
INTRAMUSCULAR | Status: DC | PRN
  Administered 2023-08-05: 40 mg via INTRAVENOUS

## 2023-08-05 MED ORDER — ONDANSETRON HCL 4 MG/2ML IJ SOLN
INTRAMUSCULAR | Status: DC | PRN
  Administered 2023-08-05: 4 mg via INTRAVENOUS

## 2023-08-05 MED ORDER — ROCURONIUM BROMIDE 10 MG/ML (PF) SYRINGE
PREFILLED_SYRINGE | INTRAVENOUS | Status: DC | PRN
  Administered 2023-08-05: 20 mg via INTRAVENOUS
  Administered 2023-08-05: 60 mg via INTRAVENOUS
  Administered 2023-08-05: 20 mg via INTRAVENOUS

## 2023-08-05 MED ORDER — CEFAZOLIN SODIUM-DEXTROSE 2-4 GM/100ML-% IV SOLN
2.0000 g | INTRAVENOUS | Status: AC
Start: 2023-08-05 — End: 2023-08-05
  Administered 2023-08-05: 2 g via INTRAVENOUS

## 2023-08-05 MED ORDER — PHENYLEPHRINE 80 MCG/ML (10ML) SYRINGE FOR IV PUSH (FOR BLOOD PRESSURE SUPPORT)
PREFILLED_SYRINGE | INTRAVENOUS | Status: DC | PRN
  Administered 2023-08-05: 80 ug via INTRAVENOUS

## 2023-08-05 MED ORDER — TRANEXAMIC ACID-NACL 1000-0.7 MG/100ML-% IV SOLN
1000.0000 mg | INTRAVENOUS | Status: AC
  Administered 2023-08-05: 1000 mg via INTRAVENOUS

## 2023-08-05 MED ORDER — SUGAMMADEX SODIUM 200 MG/2ML IV SOLN
INTRAVENOUS | Status: DC | PRN
  Administered 2023-08-05: 400 mg via INTRAVENOUS

## 2023-08-05 MED ORDER — SODIUM CHLORIDE 0.9 % IV SOLN
INTRAVENOUS | Status: AC | PRN
  Administered 2023-08-05: 1000 mL

## 2023-08-05 MED ORDER — TRANEXAMIC ACID-NACL 1000-0.7 MG/100ML-% IV SOLN
INTRAVENOUS | Status: AC
  Filled 2023-08-05: qty 100

## 2023-08-05 MED ORDER — GABAPENTIN 300 MG PO CAPS
300.0000 mg | ORAL_CAPSULE | Freq: Once | ORAL | Status: AC
Start: 2023-08-05 — End: 2023-08-05
  Administered 2023-08-05: 300 mg via ORAL

## 2023-08-05 MED ORDER — CEFAZOLIN SODIUM-DEXTROSE 2-4 GM/100ML-% IV SOLN
INTRAVENOUS | Status: AC
  Filled 2023-08-05: qty 100

## 2023-08-05 MED ORDER — SODIUM CHLORIDE 0.9 % IV SOLN: 12.5000 mg | INTRAVENOUS | Status: DC | PRN

## 2023-08-05 MED ORDER — OXYCODONE HCL 5 MG/5ML PO SOLN: 5.0000 mg | Freq: Once | ORAL | Status: DC | PRN

## 2023-08-05 MED ORDER — ACETAMINOPHEN 500 MG PO TABS
ORAL_TABLET | ORAL | Status: AC
  Filled 2023-08-05: qty 2

## 2023-08-05 MED ORDER — 0.9 % SODIUM CHLORIDE (POUR BTL) OPTIME
TOPICAL | Status: DC | PRN
  Administered 2023-08-05: 1000 mL

## 2023-08-05 MED ORDER — PROPOFOL 10 MG/ML IV BOLUS
INTRAVENOUS | Status: DC | PRN
  Administered 2023-08-05: 120 mg via INTRAVENOUS

## 2023-08-05 MED ORDER — HYDROMORPHONE HCL 1 MG/ML IJ SOLN: 0.2500 mg | INTRAMUSCULAR | Status: DC | PRN

## 2023-08-05 MED ORDER — LACTATED RINGERS IV SOLN: INTRAVENOUS | Status: DC

## 2023-08-05 MED ORDER — ACETAMINOPHEN 500 MG PO TABS
1000.0000 mg | ORAL_TABLET | Freq: Once | ORAL | Status: AC
Start: 2023-08-05 — End: 2023-08-05
  Administered 2023-08-05: 1000 mg via ORAL

## 2023-08-05 MED ORDER — BUPIVACAINE HCL (PF) 0.5 % IJ SOLN
INTRAMUSCULAR | Status: DC | PRN
  Administered 2023-08-05: 20 mL via PERINEURAL

## 2023-08-05 MED ORDER — AMISULPRIDE (ANTIEMETIC) 5 MG/2ML IV SOLN: 10.0000 mg | Freq: Once | INTRAVENOUS | Status: DC | PRN

## 2023-08-05 MED ORDER — PHENYLEPHRINE HCL-NACL 20-0.9 MG/250ML-% IV SOLN
INTRAVENOUS | Status: DC | PRN
  Administered 2023-08-05: 40 ug/min via INTRAVENOUS
  Administered 2023-08-05: 20 ug/min via INTRAVENOUS

## 2023-08-05 MED ORDER — POVIDONE-IODINE 10 % EX SOLN
CUTANEOUS | Status: DC | PRN
  Administered 2023-08-05: 1 via TOPICAL

## 2023-08-05 MED ORDER — MIDAZOLAM HCL 2 MG/2ML IJ SOLN
2.0000 mg | Freq: Once | INTRAMUSCULAR | Status: AC
  Administered 2023-08-05: 1 mg via INTRAVENOUS

## 2023-08-05 MED ORDER — DEXAMETHASONE SODIUM PHOSPHATE 10 MG/ML IJ SOLN
INTRAMUSCULAR | Status: DC | PRN
  Administered 2023-08-05: 5 mg via INTRAVENOUS

## 2023-08-05 MED ORDER — BUPIVACAINE LIPOSOME 1.3 % IJ SUSP
INTRAMUSCULAR | Status: DC | PRN
  Administered 2023-08-05: 10 mL via PERINEURAL

## 2023-08-05 MED ORDER — FENTANYL CITRATE (PF) 100 MCG/2ML IJ SOLN
100.0000 ug | Freq: Once | INTRAMUSCULAR | Status: AC
  Administered 2023-08-05: 100 ug via INTRAVENOUS

## 2023-08-05 MED ORDER — FENTANYL CITRATE (PF) 100 MCG/2ML IJ SOLN
INTRAMUSCULAR | Status: AC
  Filled 2023-08-05: qty 2

## 2023-08-05 MED ORDER — OXYCODONE HCL 5 MG PO TABS: 5.0000 mg | ORAL_TABLET | Freq: Once | ORAL | Status: DC | PRN

## 2023-08-05 SURGICAL SUPPLY — 59 items
BASEPLATE GLENOID STD REV 42 (Joint) IMPLANT
BASEPLATE SHOULDER FW 15D 29 (Joint) IMPLANT
BIT DRILL 3.2 PERIPHERAL SCREW (BIT) IMPLANT
BLADE SAW SGTL 73X25 THK (BLADE) ×1 IMPLANT
BLADE SURG 10 STRL SS (BLADE) IMPLANT
BLADE SURG 15 STRL LF DISP TIS (BLADE) IMPLANT
BRUSH SCRUB EZ PLAIN DRY (MISCELLANEOUS) IMPLANT
CHLORAPREP W/TINT 26 (MISCELLANEOUS) ×1 IMPLANT
CLSR STERI-STRIP ANTIMIC 1/2X4 (GAUZE/BANDAGES/DRESSINGS) IMPLANT
COOLER ICEMAN CLASSIC (MISCELLANEOUS) ×1 IMPLANT
COVER BACK TABLE 60X90IN (DRAPES) ×1 IMPLANT
COVER MAYO STAND STRL (DRAPES) ×1 IMPLANT
DRAPE IMP U-DRAPE 54X76 (DRAPES) IMPLANT
DRAPE INCISE IOBAN 66X45 STRL (DRAPES) ×1 IMPLANT
DRAPE POUCH INSTRU U-SHP 10X18 (DRAPES) ×1 IMPLANT
DRAPE U-SHAPE 47X51 STRL (DRAPES) ×2 IMPLANT
DRAPE U-SHAPE 76X120 STRL (DRAPES) ×2 IMPLANT
DRSG AQUACEL AG ADV 3.5X10 (GAUZE/BANDAGES/DRESSINGS) ×1 IMPLANT
ELECT BLADE 4.0 EZ CLEAN MEGAD (MISCELLANEOUS) ×1 IMPLANT
ELECT REM PT RETURN 9FT ADLT (ELECTROSURGICAL) ×1 IMPLANT
ELECTRODE BLDE 4.0 EZ CLN MEGD (MISCELLANEOUS) ×1 IMPLANT
ELECTRODE REM PT RTRN 9FT ADLT (ELECTROSURGICAL) ×1 IMPLANT
GAUZE XEROFORM 1X8 LF (GAUZE/BANDAGES/DRESSINGS) IMPLANT
GLOVE BIO SURGEON STRL SZ 6 (GLOVE) ×2 IMPLANT
GLOVE BIO SURGEON STRL SZ7.5 (GLOVE) ×2 IMPLANT
GLOVE BIOGEL PI IND STRL 6.5 (GLOVE) ×1 IMPLANT
GLOVE BIOGEL PI IND STRL 8 (GLOVE) ×1 IMPLANT
GOWN STRL REUS W/ TWL LRG LVL3 (GOWN DISPOSABLE) ×2 IMPLANT
GOWN STRL REUS W/TWL XL LVL3 (GOWN DISPOSABLE) ×1 IMPLANT
GUIDE PIN 3X75 SHOULDER (PIN) ×2 IMPLANT
GUIDEWIRE GLENOID 2.5X220 (WIRE) IMPLANT
HUMERAL SYSTEM SZ 3/4 42X+0 (Orthopedic Implant) ×1 IMPLANT
KIT SHOULDER STAB MARCO (KITS) ×1 IMPLANT
MANIFOLD NEPTUNE II (INSTRUMENTS) ×1 IMPLANT
PACK BASIN DAY SURGERY FS (CUSTOM PROCEDURE TRAY) ×1 IMPLANT
PACK SHOULDER (CUSTOM PROCEDURE TRAY) ×1 IMPLANT
PAD COLD SHLDR WRAP-ON (PAD) ×1 IMPLANT
PIN GUIDE 3X75 SHOULDER (PIN) IMPLANT
RESTRAINT HEAD UNIVERSAL NS (MISCELLANEOUS) ×1 IMPLANT
SCREW 5.0X18 (Screw) IMPLANT
SCREW BONE INTRNL SM 7 (Screw) IMPLANT
SCREW PERIPHERAL 30 (Screw) IMPLANT
SET HNDPC FAN SPRY TIP SCT (DISPOSABLE) ×1 IMPLANT
SHEET MEDIUM DRAPE 40X70 STRL (DRAPES) ×1 IMPLANT
SLEEVE SCD COMPRESS KNEE MED (STOCKING) ×1 IMPLANT
SPIKE FLUID TRANSFER (MISCELLANEOUS) IMPLANT
SPONGE T-LAP 18X18 ~~LOC~~+RFID (SPONGE) ×1 IMPLANT
STEM HUM PERFORM 3+ (Stem) IMPLANT
SUCTION TUBE FRAZIER 10FR DISP (SUCTIONS) ×1 IMPLANT
SUT ETHIBOND 2 V 37 (SUTURE) ×1 IMPLANT
SUT ETHILON 3 0 PS 1 (SUTURE) IMPLANT
SUT MNCRL AB 3-0 PS2 27 (SUTURE) ×1 IMPLANT
SUT VIC AB 0 CT1 27XBRD ANBCTR (SUTURE) ×2 IMPLANT
SUT VIC AB 2-0 CT1 TAPERPNT 27 (SUTURE) ×2 IMPLANT
SUT VIC AB 3-0 SH 27X BRD (SUTURE) IMPLANT
SYR 50ML LL SCALE MARK (SYRINGE) ×1 IMPLANT
SYSTEM HUMERALTEM SZ 3/4 42X+0 (Orthopedic Implant) IMPLANT
TOWEL GREEN STERILE FF (TOWEL DISPOSABLE) ×3 IMPLANT
TUBE SUCTION HIGH CAP CLEAR NV (SUCTIONS) ×1 IMPLANT

## 2023-08-05 NOTE — Progress Notes (Signed)
Assisted Dr. Sabra Heck with left, interscalene , ultrasound guided block. Side rails up, monitors on throughout procedure. See vital signs in flow sheet. Tolerated Procedure well.

## 2023-08-05 NOTE — Op Note (Signed)
Date of Surgery: 08/05/2023  INDICATIONS: Mr. Leisher is a 77 y.o.-year-old male with left shoulder rotator cuff arthropathy.  The risk and benefits of the procedure were discussed in detail and documented in the pre-operative evaluation.   PREOPERATIVE DIAGNOSIS: 1. Left shoulder rotator cuff arthropathy  POSTOPERATIVE DIAGNOSIS: Same.  PROCEDURE: 1. Left shoulder reverse shoulder arthroplasty 2. Left shoulder biceps tenodesis  SURGEON: Benancio Deeds MD  ASSISTANT: Kerby Less, ATC  ANESTHESIA:  general  IV FLUIDS AND URINE: See anesthesia record.  ANTIBIOTICS: Ancef  ESTIMATED BLOOD LOSS: 10 mL.  IMPLANTS:  Implant Name Type Inv. Item Serial No. Manufacturer Lot No. LRB No. Used Action  BASEPLATE GLENOID STD REV 42 - [id number] Joint BASEPLATE GLENOID STD REV 42 [id number] [employer]  Left 1 Implanted  BASEPLATE SHOULDER FW 15D 29 - [id number] Joint BASEPLATE SHOULDER FW 15D 29 [id number] [employer]  Left 1 Implanted  SCREW BONE INTRNL SM 7 - [id number] Screw SCREW BONE INTRNL SM 7 [id number] [employer]  Left 1 Implanted  STEM HUM PERFORM 3+ - [id number] Stem STEM HUM PERFORM 3+ [id number] [employer]  Left 1 Implanted  SCREW PERIPHERAL 30 - [id number] Screw SCREW PERIPHERAL 30  [employer]  Left 3 Implanted  SCREW 5.0X18 - [id number] Screw SCREW 5.0X18  [employer]  Left 1 Implanted  HUMERAL SYSTEM SZ 3/4 42X+0 - [id number] Orthopedic Implant HUMERAL SYSTEM SZ 3/4 42X+0 [id number] [employer]  Left 1 Implanted    DRAINS: None  CULTURES: None  COMPLICATIONS: none  DESCRIPTION OF PROCEDURE:   Patient was identified in the preoperative holding area.  Anesthesia performed an interscalene nerve block after universal timeout was performed with nursing.  Ancef was given 1 hour prior to skin incision.    The surgical site was scrubbed with a chlorhexidine scrub brush and alcohol.  The patient was then prepped with chlorhexidine skin prep.  The patient was subsequently  taken back to the operating room.  Anesthesia was induced. The patient was transferred to the beachchair position.  All bony prominences were padded.  Final timeout was again performed.     The bony landmarks of the shoulder were marked with a marking pen. A delto-pectoral incision was made, extending up approximately 5 inches. The wound with then irrigated with dilute betadine. Cephalic vein was identified, and an protected. This was retracted medially. Subdeltoid and subpectoral lesions were released. Neurovascular structures were carefully protected. The Gelpi retractor was used to retract the deltoid and pectoralis major.    The deltoid was retracted laterally with a Brown humeral retractor.  The conjoined tendon was identified. The cleido-pectoral fascia was excised.  The axillary nerve was palpated and carefully protected throughout the procedure. The biceps tendon was found and tenodesed to the upper pec with # 2 Ethibond non-absorbable suture.  Proximally the biceps tendon was removed up to the joint.  The bicipital groove was used for a landmark to establish rotator cuff interval. The subscap was tagged with a #2 Ethibond.  At this point the subscapularis was peeled off from the lesser tuberosity with care to avoid dissection distally in order to protect the axillary nerve.  Once the joint was exposed the proximal humerus was delivered with external rotation and extension of the arm. The humerus was prepped initially by performing a humeral neck cut. This was done with the guide using 30 degrees of retroversion as a reference.  The head portion was removed.  A medullary sounding reamer was then used.  We  subsequently placed our guidewire through the center of the humeral head using the reference guide.  This was a size 3.  Metaphyseal reamer was then used.  Finally the size 3 broach was malleted into place with excellent purchase.  A tonsil clamp was used to attempt to pull this out with very good  purchase   Attention was then turned to the glenoid.  Posteriorly a large Darach retractor was used.  A 360 Degree release of the subscapularis and glenoid were done. The capsule was released from the humerus.    Glenoid retractors were placed posteriorly, superiorly behind the biceps tendon and anteriorly on the glenoid neck. A 360-degree release of the capsule was performed with cautery.  The triceps was released off the inferior tubercle of the glenoid. The axillary nerve was carefully protected with the surgeon's index finger, retracting it and using cautery.   A guidepin was placed through the glenoid guide. The guidepin was drilled until it exited the cortex. The guidepin was over drilled. Next, the glenoid was prepared with the reamer down to cortical bone.  The central peg hole was totally within the scapular neck tested with the probe.  The baseplate was then placed screwed securely with good purchase in position and then secured with 4 screws. In each case, they were drilled and measured and the appropriate length screw placed with excellent rigid fixation of the baseplate. The glenosphere was placed with size based based on pre-operative templating.   The humerus was then delivered and a neutral polyethylene trial was placed.  This was brought to just the level of the reduction but not completely reduced.  A 0 final poly was selected and impacted.    Appropriate tension was noted on the conjoined tendon and deltoid muscle.  Extension was stable, external and internal rotation as well.  The subscap was pulled over but as this was not able to reach comfortably decision was made not to repair in order to prevent limited in external rotation.  The wound was then irrigated. Vancomycin powder was placed in the wound again for infection prevention.   The wound was then closed in layers with 0 Vicryl interrupted in the deep subcu followed by 2-0 Vicryl in the superficial subcu and 3-0 nylon for skin.   An Aquacel dressing was applied as well as an Veterinary surgeon.  A shoulder immobilizer was applied.     Postoperative Plan: -The patient will begin the reverse shoulder rehab protocol  -Aspirin 325 mg daily will be used for 4 weeks for blood clot prevention -I will see the patient back in 2 weeks for first postoperative wound check     Benancio Deeds, MD 1:58 PM

## 2023-08-05 NOTE — Anesthesia Postprocedure Evaluation (Signed)
Anesthesia Post Note  Patient: [name]  Procedure(s) Performed: LEFT REVERSE SHOULDER ARTHROPLASTY (Left: Shoulder)     Patient location during evaluation: PACU Anesthesia Type: General Level of consciousness: awake and alert Pain management: pain level controlled Vital Signs Assessment: post-procedure vital signs reviewed and stable Respiratory status: spontaneous breathing, nonlabored ventilation, respiratory function stable and patient connected to nasal cannula oxygen Cardiovascular status: blood pressure returned to baseline and stable Postop Assessment: no apparent nausea or vomiting Anesthetic complications: no  No notable events documented.  Last Vitals:  Vitals:   08/05/23 1445 08/05/23 1500  BP: 120/72 (!) 148/77  Pulse: 85 68  Resp: 18 16  Temp:  (!) 36.3 C  SpO2: 92% 92%    Last Pain:  Vitals:   08/05/23 1500  TempSrc:   PainSc: 0-No pain                 Kennieth Rad

## 2023-08-05 NOTE — Brief Op Note (Signed)
   Brief Op Note  Date of Surgery: 08/05/2023  Preoperative Diagnosis: LEFT GLENOHUMERAL OSTEOARTHRITIS  Postoperative Diagnosis: same  Procedure: Procedure(s): LEFT REVERSE SHOULDER ARTHROPLASTY  Implants: Implant Name Type Inv. Item Serial No. Manufacturer Lot No. LRB No. Used Action  BASEPLATE GLENOID STD REV 42 - [id number] Joint BASEPLATE GLENOID STD REV 42 [id number] [employer]  Left 1 Implanted  BASEPLATE SHOULDER FW 15D 29 - [id number] Joint BASEPLATE SHOULDER FW 15D 29 [id number] [employer]  Left 1 Implanted  SCREW BONE INTRNL SM 7 - [id number] Screw SCREW BONE INTRNL SM 7 [id number] [employer]  Left 1 Implanted  STEM HUM PERFORM 3+ - [id number] Stem STEM HUM PERFORM 3+ [id number] [employer]  Left 1 Implanted  SCREW PERIPHERAL 30 - [id number] Screw SCREW PERIPHERAL 30  [employer]  Left 3 Implanted  SCREW 5.0X18 - [id number] Screw SCREW 5.0X18  [employer]  Left 1 Implanted  HUMERAL SYSTEM SZ 3/4 42X+0 - [id number] Orthopedic Implant HUMERAL SYSTEM SZ 3/4 42X+0 [id number] [employer]  Left 1 Implanted    Surgeons: Surgeon(s): Huel Cote, MD  Anesthesia: General    Estimated Blood Loss: See anesthesia record  Complications: None  Condition to PACU: Stable  Benancio Deeds, MD 08/05/2023 1:58 PM

## 2023-08-05 NOTE — Anesthesia Procedure Notes (Signed)
Anesthesia Regional Block: Interscalene brachial plexus block   Pre-Anesthetic Checklist: , timeout performed,  Correct Patient, Correct Site, Correct Laterality,  Correct Procedure, Correct Position, site marked,  Risks and benefits discussed,  Surgical consent,  Pre-op evaluation,  At surgeon's request and post-op pain management  Laterality: Left  Prep: chloraprep       Needles:  Injection technique: Single-shot  Needle Type: Stimiplex     Needle Length: 9cm  Needle Gauge: 21     Additional Needles:   Procedures:,,,, ultrasound used (permanent image in chart),,    Narrative:  Start time: 08/05/2023 11:19 AM End time: 08/05/2023 11:24 AM Injection made incrementally with aspirations every 5 mL.  Performed by: Personally  Anesthesiologist: Lowella Curb, MD

## 2023-08-05 NOTE — Interval H&P Note (Signed)
History and Physical Interval Note:  08/05/2023 11:17 AM  [name]  has presented today for surgery, with the diagnosis of LEFT GLENOHUMERAL OSTEOARTHRITIS.  The various methods of treatment have been discussed with the patient and family. After consideration of risks, benefits and other options for treatment, the patient has consented to  Procedure(s): LEFT REVERSE SHOULDER ARTHROPLASTY (Left) as a surgical intervention.  The patient's history has been reviewed, patient examined, no change in status, stable for surgery.  I have reviewed the patient's chart and labs.  Questions were answered to the patient's satisfaction.     Huel Cote

## 2023-08-05 NOTE — Transfer of Care (Signed)
Immediate Anesthesia Transfer of Care Note  Patient: [name]  Procedure(s) Performed: LEFT REVERSE SHOULDER ARTHROPLASTY (Left: Shoulder)  Patient Location: PACU  Anesthesia Type:General  Level of Consciousness: drowsy  Airway & Oxygen Therapy: Patient Spontanous Breathing and Patient connected to nasal cannula oxygen  Post-op Assessment: Report given to RN and Post -op Vital signs reviewed and stable  Post vital signs: Reviewed and stable  Last Vitals:  Vitals Value Taken Time  BP 153/76 08/05/23 1417  Temp 36.7 C 08/05/23 1417  Pulse 84 08/05/23 1429  Resp 19 08/05/23 1429  SpO2 94 % 08/05/23 1429  Vitals shown include unfiled device data.  Last Pain:  Vitals:   08/05/23 1417  TempSrc:   PainSc: 0-No pain      Patients Stated Pain Goal: 6 (08/05/23 1004)  Complications: No notable events documented.

## 2023-08-05 NOTE — Anesthesia Preprocedure Evaluation (Signed)
Anesthesia Evaluation  Patient identified by MRN, date of birth, ID band Patient awake    Reviewed: Allergy & Precautions, H&P , NPO status , Patient's Chart, lab work & pertinent test results  Airway Mallampati: II  TM Distance: >3 FB Neck ROM: Full    Dental no notable dental hx. (+) Edentulous Upper, Edentulous Lower, Dental Advisory Given   Pulmonary COPD,  COPD inhaler, former smoker   Pulmonary exam normal breath sounds clear to auscultation       Cardiovascular hypertension, Pt. on medications  Rhythm:Regular Rate:Normal     Neuro/Psych negative neurological ROS  negative psych ROS   GI/Hepatic Neg liver ROS, PUD,GERD  Medicated,,  Endo/Other  negative endocrine ROS    Renal/GU negative Renal ROS  negative genitourinary   Musculoskeletal  (+) Arthritis , Osteoarthritis,    Abdominal   Peds  Hematology  (+) Blood dyscrasia, anemia   Anesthesia Other Findings   Reproductive/Obstetrics negative OB ROS                             Anesthesia Physical Anesthesia Plan  ASA: 3  Anesthesia Plan: General   Post-op Pain Management: Regional block* and Minimal or no pain anticipated   Induction: Intravenous  PONV Risk Score and Plan: 2 and Ondansetron, Treatment may vary due to age or medical condition and Midazolam  Airway Management Planned: Oral ETT  Additional Equipment:   Intra-op Plan:   Post-operative Plan: Extubation in OR  Informed Consent: I have reviewed the patients History and Physical, chart, labs and discussed the procedure including the risks, benefits and alternatives for the proposed anesthesia with the patient or authorized representative who has indicated his/her understanding and acceptance.     Dental advisory given  Plan Discussed with: CRNA  Anesthesia Plan Comments: (PAT note written 08/21/2022 by Shonna Chock, PA-C.  )       Anesthesia  Quick Evaluation

## 2023-08-05 NOTE — Discharge Instructions (Addendum)
Discharge Instructions    Attending Surgeon: Huel Cote, MD Office Phone Number: [phone]   Diagnosis and Procedures:    Surgeries Performed: Left shoulder reverse shoulder arthroplasty  Discharge Plan:    Diet: Resume usual diet. Begin with light or bland foods.  Drink plenty of fluids.  Activity:  Keep sling and dressing in place until your follow up visit in Physical Therapy You are advised to go home directly from the hospital or surgical center. Restrict your activities.  GENERAL INSTRUCTIONS: 1.  Keep your surgical site elevated above your heart for at least 5-7 days or longer to prevent swelling. This will improve your comfort and your overall recovery following surgery.     2. Please call Dr. Serena Croissant office at [phone] with questions Monday-Friday during business hours. If no one answers, please leave a message and someone should get back to the patient within 24 hours. For emergencies please call 911 or proceed to the emergency room.   3. Patient to notify surgical team if experiences any of the following: Bowel/Bladder dysfunction, uncontrolled pain, nerve/muscle weakness, incision with increased drainage or redness, nausea/vomiting and Fever greater than 101.0 F.  Be alert for signs of infection including redness, streaking, odor, fever or chills. Be alert for excessive pain or bleeding and notify your surgeon immediately.  WOUND INSTRUCTIONS:   Leave your dressing/cast/splint in place until your post operative visit.  Keep it clean and dry.  Always keep the incision clean and dry until the staples/sutures are removed. If there is no drainage from the incision you should keep it open to air. If there is drainage from the incision you must keep it covered at all times until the drainage stops  Do not soak in a bath tub, hot tub, pool, lake or other body of water until 21 days after your surgery and your incision is completely dry and healed.  If you  have removable sutures (or staples) they must be removed 10-14 days (unless otherwise instructed) from the day of your surgery.     1)  Elevate the extremity as much as possible.  2)  Keep the dressing clean and dry.  3)  Please call us if the dressing becomes wet or dirty.  4)  If you are experiencing worsening pain or worsening swelling, please call.     MEDICATIONS: Resume all previous home medications at the previous prescribed dose and frequency unless otherwise noted Start taking the  pain medications on an as-needed basis as prescribed  Please taper down pain medication over the next week following surgery.  Ideally you should not require a refill of any narcotic pain medication.  Take pain medication with food to minimize nausea. In addition to the prescribed pain medication, you may take over-the-counter pain relievers such as Tylenol.  Do NOT take additional tylenol if your pain medication already has tylenol in it.  Aspirin 325mg  daily per bottle instructions. Narcotic Policy: Per [facility] clinic policy, our goal is ensure optimal postoperative pain control with a multimodal pain management strategy. For all [facility] patients, our goal is to wean post-operative narcotic medications by 6 weeks post-operatively, and many times sooner. If this is not possible due to utilization of pain medication prior to surgery, your [facility] doctor will support your acute post-operative pain control for the first 6 weeks postoperatively, with a plan to transition you back to your primary pain team following that. Cyndia Skeeters will work to ensure a Therapist, occupational.  FOLLOWUP INSTRUCTIONS: 1. Follow up at the Physical Therapy Clinic 3-4 days following surgery. This appointment should be scheduled unless other arrangements have been made.The Physical Therapy scheduling number is [phone] if an appointment has not already been arranged.  2. Contact Dr. Serena Croissant office during office hours at  [phone] or the practice after hours line at [phone] for non-emergencies. For medical emergencies call 911.   Discharge Location: Home  No tylenol until 6pm

## 2023-08-05 NOTE — Anesthesia Procedure Notes (Addendum)
Procedure Name: Intubation Date/Time: 08/05/2023 12:29 PM  Performed by: Camillia Herter, CRNAPre-anesthesia Checklist: Patient identified, Emergency Drugs available, Suction available and Patient being monitored Patient Re-evaluated:Patient Re-evaluated prior to induction Oxygen Delivery Method: Circle System Utilized Preoxygenation: Pre-oxygenation with 100% oxygen Induction Type: IV induction Ventilation: Mask ventilation without difficulty Laryngoscope Size: Miller and 2 Tube type: Oral Tube size: 8.0 mm Number of attempts: 1 Airway Equipment and Method: Stylet and Oral airway Placement Confirmation: ETT inserted through vocal cords under direct vision, positive ETCO2 and breath sounds checked- equal and bilateral Secured at: 23 cm Tube secured with: Tape Dental Injury: Teeth and Oropharynx as per pre-operative assessment

## 2023-08-05 NOTE — H&P (Signed)
Post Operative Evaluation      Procedure/Date of Surgery: Right shoulder reverse shoulder arthroplasty 12/21   Interval History:    Presents today status post the above procedure.  He is doing extremely well with regard to the right side.  Range of motion is doing much better and he is now able to finish quite well with the right.  He is experiencing persistent limited overhead activity on the left.  He is experiencing persistent pain and swelling.  He has been doing physical therapy for the both the right and the left without any relief on the left.     PMH/PSH/Family History/Social History/Meds/Allergies:         Past Medical History:  Diagnosis Date   Allergic rhinitis     Atypical chest pain 12/01/2014   Back pain 01/12/2013   COPD (chronic obstructive pulmonary disease) (HCC)      FeV1 64%-2007   Dyspnea     Dysrhythmia      PVCs   Emphysema     GERD (gastroesophageal reflux disease)     History of lung abscess      bronchiectasis with RMLandRLL ersection -1996- Dr Edwyna Shell   Hordeolum externum (stye) 04/23/2015    Right eye   Hypertension     Impaired vision      glasses   Osteoarthritis      hands and knees   Pneumonia     Sinusitis, acute 04/23/2015             Past Surgical History:  Procedure Laterality Date   COLONOSCOPY   11/20/2010    Dr.Stark   ESOPHAGOGASTRODUODENOSCOPY (EGD) WITH PROPOFOL N/A 10/31/2021    Procedure: ESOPHAGOGASTRODUODENOSCOPY (EGD) WITH PROPOFOL;  Surgeon: Iva Boop, MD;  Location: [facility] ENDOSCOPY;  Service: Gastroenterology;  Laterality: N/A;   HEMOSTASIS CONTROL   10/31/2021    Procedure: HEMOSTASIS CONTROL;  Surgeon: Iva Boop, MD;  Location: [facility] ENDOSCOPY;  Service: Gastroenterology;;   HERNIA REPAIR   10/03/2009   HOT HEMOSTASIS N/A 10/31/2021    Procedure: HOT HEMOSTASIS (ARGON PLASMA COAGULATION/BICAP);  Surgeon: Iva Boop, MD;  Location: [facility] ENDOSCOPY;  Service: Gastroenterology;  Laterality:  N/A;   left knee       lower back surgery   09/02/2008    06/2009   LUNG SURGERY        RML andRUL removed due to bleeding and bronchiectasis and lung abcess   NECK SURGERY   05/03/2008   REVERSE SHOULDER ARTHROPLASTY Right 08/22/2022    Procedure: RIGHT REVERSE SHOULDER ARTHROPLASTY;  Surgeon: Huel Cote, MD;  Location: [facility] OR;  Service: Orthopedics;  Laterality: Right;   right foot surgery       SCLEROTHERAPY   10/31/2021    Procedure: SCLEROTHERAPY;  Surgeon: Iva Boop, MD;  Location: [facility] ENDOSCOPY;  Service: Gastroenterology;;   TONSILLECTOMY        removed as a child   VIDEO BRONCHOSCOPY N/A 07/12/2020    Procedure: VIDEO BRONCHOSCOPY;  Surgeon: [name], MD;  Location: [facility] OR;  Service: Thoracic;  Laterality: N/A;   VIDEO BRONCHOSCOPY WITH ENDOBRONCHIAL NAVIGATION N/A 07/12/2020    Procedure: VIDEO BRONCHOSCOPY WITH ENDOBRONCHIAL NAVIGATION;  Surgeon: [name], MD;  Location: [facility] OR;  Service: Thoracic;  Laterality: N/A;        Social History         Socioeconomic History   Marital status: Married      Spouse name: Darcee  Number of children: Not on file   Years of education: Not on file   Highest education level: Bachelor's degree (e.g., BA, AB, BS)  Occupational History   Occupation: retired Librarian, academic  Tobacco Use   Smoking status: Former      Current packs/day: 0.00      Average packs/day: 1.5 packs/day for 38.0 years (57.0 ttl pk-yrs)      Types: Cigarettes      Start date: 09/03/1962      Quit date: 09/03/2000      Years since quitting: 22.7      Passive exposure: Past   Smokeless tobacco: Never  Vaping Use   Vaping status: Never Used  Substance and Sexual Activity   Alcohol use: Yes      Alcohol/week: 1.0 standard drink of alcohol      Types: 1 Cans of beer per week      Comment: 3 or 4 nights weekly   Drug use: No   Sexual activity: Never  Other Topics Concern   Not on file  Social History Narrative    Retired  Librarian, academic    Patient states former smoker. 1 1/2 ppd x 38 yrs  Quit in Jan 2002    Married - 2 weeks (4th marriage)    divorced,  remarried 84-2004 (lost wife to lung ca),  remarried (divorced),     1 son  - 87 [name])    Alcohol use-yes (2-3 beers per week)                Social Determinants of Health        Financial Resource Strain: Low Risk  (03/06/2023)    Overall Financial Resource Strain (CARDIA)     Difficulty of Paying Living Expenses: Not hard at all  Food Insecurity: No Food Insecurity (03/06/2023)    Hunger Vital Sign     Worried About Running Out of Food in the Last Year: Never true     Ran Out of Food in the Last Year: Never true  Transportation Needs: No Transportation Needs (03/06/2023)    PRAPARE - Therapist, art (Medical): No     Lack of Transportation (Non-Medical): No  Physical Activity: Sufficiently Active (03/06/2023)    Exercise Vital Sign     Days of Exercise per Week: 7 days     Minutes of Exercise per Session: 30 min  Stress: No Stress Concern Present (03/06/2023)    [employer] of Occupational Health - Occupational Stress Questionnaire     Feeling of Stress : Not at all  Social Connections: Socially Integrated (03/06/2023)    Social Connection and Isolation Panel [NHANES]     Frequency of Communication with Friends and Family: Three times a week     Frequency of Social Gatherings with Friends and Family: Twice a week     Attends Religious Services: More than 4 times per year     Active Member of [employer] or Organizations: Yes     Attends Banker Meetings: 1 to 4 times per year     Marital Status: Married         Family History  Problem Relation Age of Onset   Heart failure Mother          age 38   Prostate cancer Father          father died prostate ca   Obstructive Sleep Apnea Brother     Obesity  Brother     Colon cancer Paternal Grandmother     Healthy Son     Coronary artery disease Other           1st degree relative<60   Stroke Other          1st degree relative<50   Esophageal cancer Neg Hx     Stomach cancer Neg Hx          Allergies       Allergies  Allergen Reactions   Nsaids        Do not give to patient due to hx of stomach bleeds    Losartan Other (See Comments)      weakness   Meloxicam        Caused Stomach Bleeds             Current Outpatient Medications  Medication Sig Dispense Refill   acetaminophen (TYLENOL) 500 MG tablet Take 1 tablet (500 mg total) by mouth every 8 (eight) hours for 10 days. 30 tablet 0   aspirin EC 325 MG tablet Take 1 tablet (325 mg total) by mouth daily. 14 tablet 0   oxyCODONE (ROXICODONE) 5 MG immediate release tablet Take 1 tablet (5 mg total) by mouth every 4 (four) hours as needed for severe pain or breakthrough pain. 15 tablet 0   acetaminophen (TYLENOL) 500 MG tablet Take 1,000 mg by mouth. Takes 1000mg  morning and afternoon and 500mg  at bedtime       albuterol (VENTOLIN HFA) 108 (90 Base) MCG/ACT inhaler INHALE 2 PUFFS INTO THE LUNGS EVERY 6 HOURS AS NEEDED FOR WHEEZING OR SHORTNESS OF BREATH. 2 each 10   amLODipine (NORVASC) 5 MG tablet TAKE 1 TABLET EVERY DAY 90 tablet 3   benzonatate (TESSALON) 100 MG capsule TAKE 1 CAPSULE BY MOUTH TWICE DAILY AS NEEDED FOR COUGH 40 capsule 0   calcium elemental as carbonate (BARIATRIC TUMS ULTRA) 400 MG chewable tablet Chew 1,000 mg by mouth daily.       cetirizine (ZYRTEC) 10 MG tablet Take 10 mg by mouth daily.       COVID-19 mRNA vaccine, Pfizer, (COMIRNATY) syringe Inject 0.3 mLs into the muscle once for 1 dose. 0.3 mL 0   fluticasone (FLONASE) 50 MCG/ACT nasal spray USE 2 SPRAYS IN EACH NOSTRIL EVERY DAY 48 g 10   Glucosamine-Chondroitin 750-600 MG TABS Take 1 tablet by mouth 2 (two) times daily.       guaiFENesin (MUCINEX) 600 MG 12 hr tablet Take 600 mg by mouth 2 (two) times daily.       influenza vaccine adjuvanted (FLUAD) 0.5 ML injection Inject 0.5 mLs into the muscle once for  1 dose. 0.5 mL 0   loratadine (CLARITIN) 10 MG tablet Take 10 mg by mouth daily.       Menthol, Topical Analgesic, (BLUE-EMU MAXIMUM STRENGTH EX) Apply 1 application  topically daily as needed (arthritis pain).       montelukast (SINGULAIR) 10 MG tablet TAKE 1 TABLET EVERY DAY 90 tablet 3   Multiple Vitamin (MULTIVITAMIN WITH MINERALS) TABS tablet Take 1 tablet by mouth daily.        pantoprazole (PROTONIX) 20 MG tablet Take 1 tablet (20 mg total) by mouth daily. 90 tablet 1   traMADol (ULTRAM) 50 MG tablet TAKE 1 TABLET BY MOUTH EVERY 12 HOURS AS NEEDED 60 tablet 0   TRELEGY ELLIPTA 100-62.5-25 MCG/ACT AEPB INHALE 1 PUFF INTO THE LUNGS DAILY 180 each 3   triamcinolone cream (KENALOG)  0.1 % Apply 1 Application topically 2 (two) times daily as needed (irritation).          No current facility-administered medications for this visit.      Imaging Results (Last 48 hours)  No results found.     Review of Systems:   A ROS was performed including pertinent positives and negatives as documented in the HPI.     Musculoskeletal Exam:     There were no vitals taken for this visit.   Right shoulder incision is well-appearing without erythema or drainage.  Range of motion in the supine position is to 90 degrees forward elevation and 20 degrees external rotation.  Internal rotation deferred today.  Distal neurosensory exam is intact.  2+ radial pulse.   Left shoulder range of motion is from 0 to 80 degrees actively with abduction to 70 degrees with pain.  External rotation at side is to neutral.  Internal rotation is to back pocket.  There is positive crepitus and pain about the shoulder   Imaging:     3 views left shoulder x-ray: There is evidence of left shoulder rotator cuff arthropathy with complete dissolution of the acromiohumeral interval   I personally reviewed and interpreted the radiographs.     Assessment:   77 year old male doing extremely well status post right shoulder reverse  shoulder arthroplasty.  Today's visit he is having extremely limited range of motion as well as pain in the left shoulder.  Given this we did discuss treatment options.  He has done physical therapy without any relief.  At this time I did discuss additional options including left reverse shoulder arthroplasty.  We did discuss the risks and limitations.  He would like to proceed with this. Plan :     -Plan for left reverse shoulder arthroplasty     After a lengthy discussion of treatment options, including risks, benefits, alternatives, complications of surgical and nonsurgical conservative options, the patient elected surgical repair.    The patient  is aware of the material risks  and complications including, but not limited to injury to adjacent structures, neurovascular injury, infection, numbness, bleeding, implant failure, thermal burns, stiffness, persistent pain, failure to heal, disease transmission from allograft, need for further surgery, dislocation, anesthetic risks, blood clots, risks of death,and others. The probabilities of surgical success and failure discussed with patient given their particular co-morbidities.The time and nature of expected rehabilitation and recovery was discussed.The patient's questions were all answered preoperatively.  No barriers to understanding were noted. I explained the natural history of the disease process and Rx rationale.  I explained to the patient what I considered to be reasonable expectations given their personal situation.  The final treatment plan was arrived at through a shared patient decision making process model.             I personally saw and evaluated the patient, and participated in the management and treatment plan.   Huel Cote, MD Attending Physician, Orthopedic Surgery   This document was dictated using Dragon voice recognition software. A reasonable attempt at proof reading has been made to minimize errors.

## 2023-08-06 ENCOUNTER — Encounter (HOSPITAL_BASED_OUTPATIENT_CLINIC_OR_DEPARTMENT_OTHER): Payer: Self-pay | Admitting: Orthopaedic Surgery

## 2023-08-06 IMAGING — CT CT CHEST W/O CM
1 series · 15 of 34 positions shown, 19 images · non-contrast
Comparison: 10/18/2020

CLINICAL DATA: Cavitary lesion.  Lung nodule

EXAM:
CT CHEST WITHOUT CONTRAST
TECHNIQUE: Multidetector CT imaging of the chest was performed following the
standard protocol without IV contrast.

[Series 2: chest w/(date) · axial · 0.70mm/px · z∈[-340,-22]mm · 15 of 187 slices shown, 19 images]
[im 14/187  mediastinal]
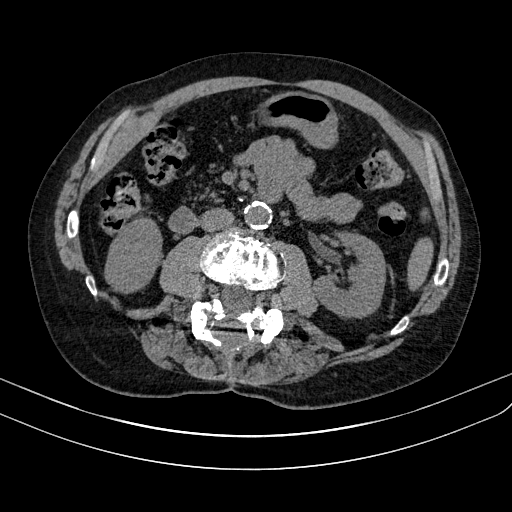
[im 14/187  lung]
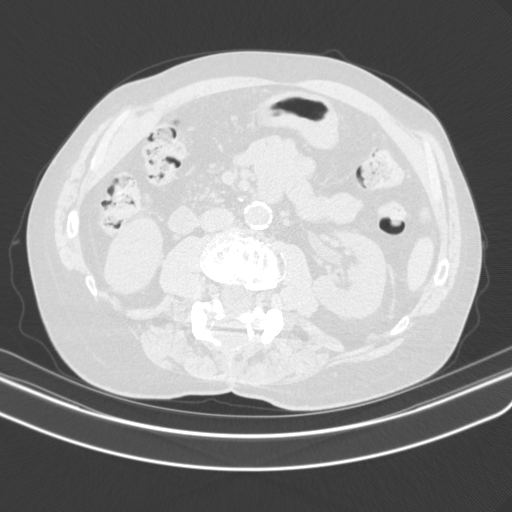
[im 28/187  lung]
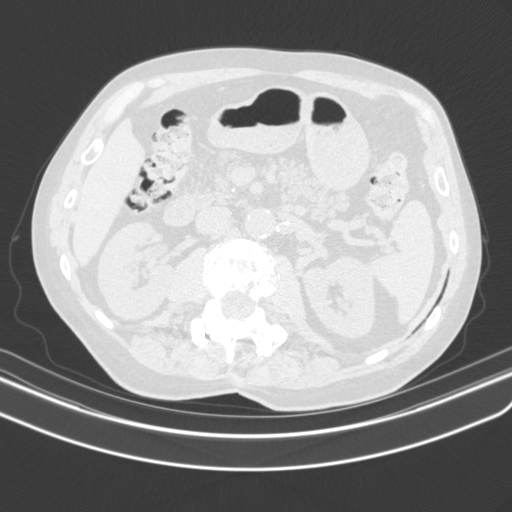
[im 38/187  lung]
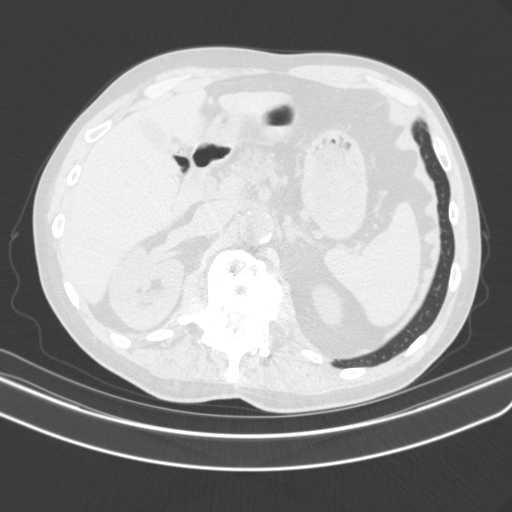
[im 49/187  lung]
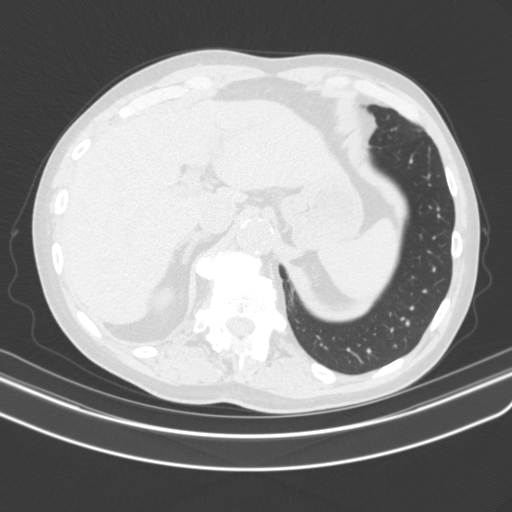
[im 63/187  mediastinal]
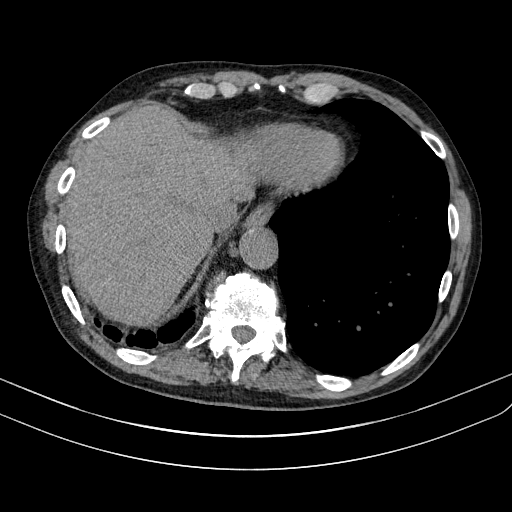
[im 63/187  lung]
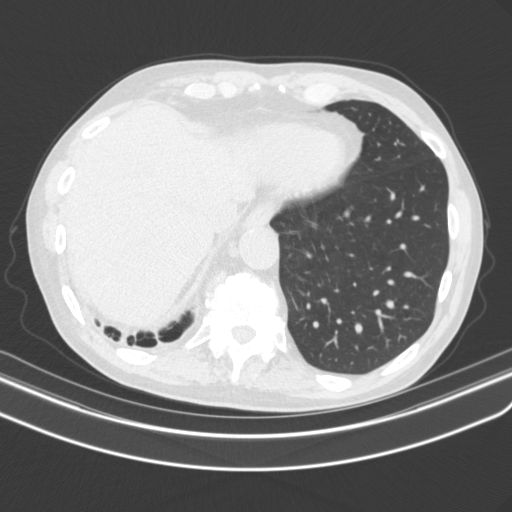
[im 75/187  lung]
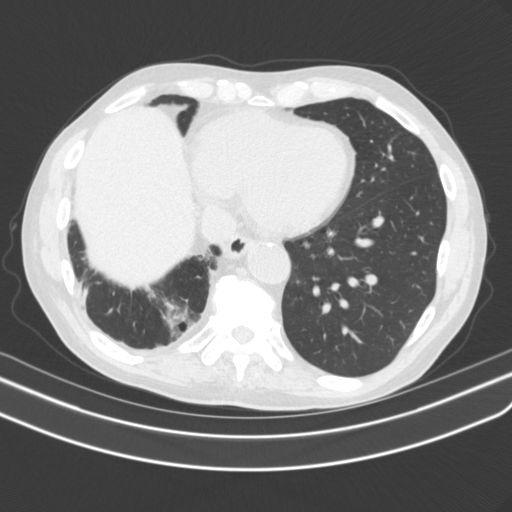
[im 83/187  lung]
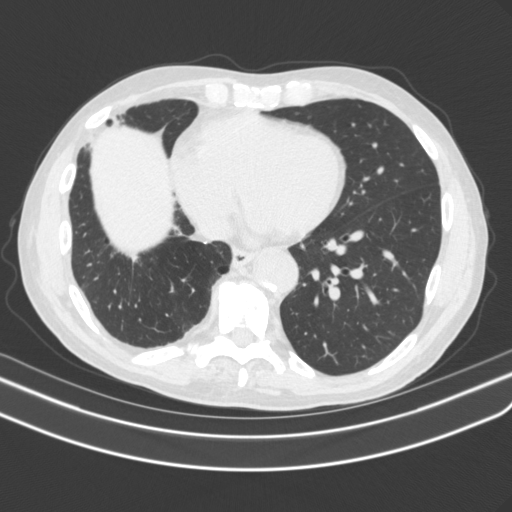
[im 97/187  lung]
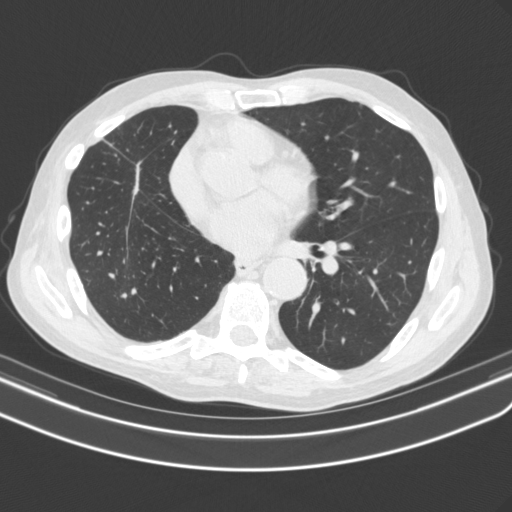
[im 104/187  mediastinal]
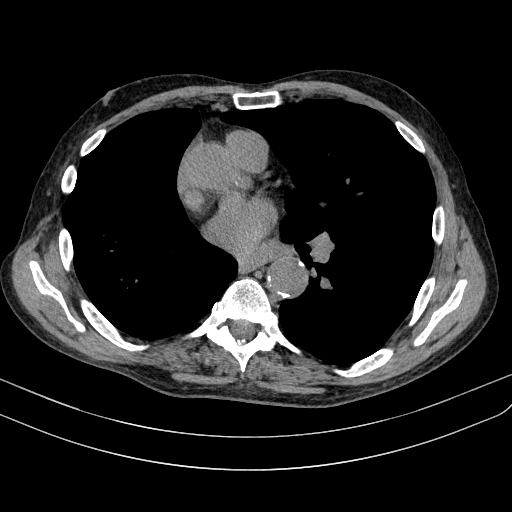
[im 104/187  lung]
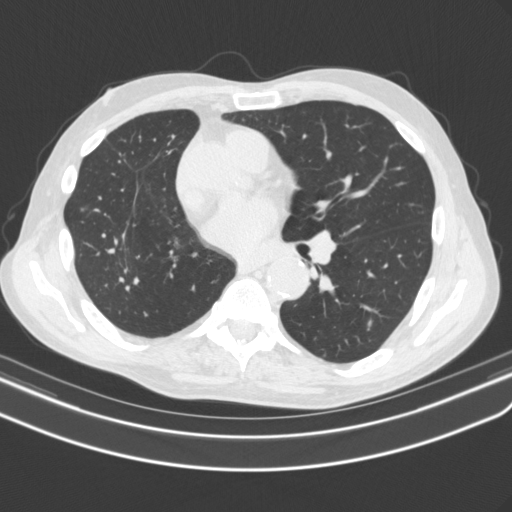
[im 112/187  lung]
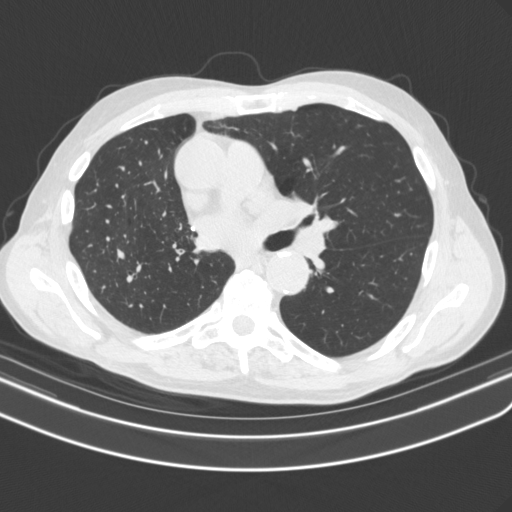
[im 125/187  lung]
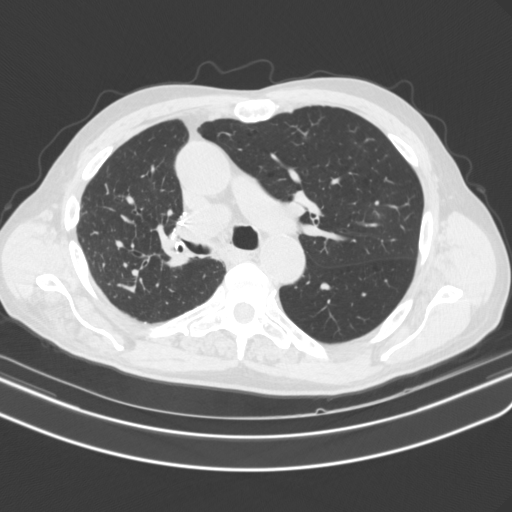
[im 138/187  lung]
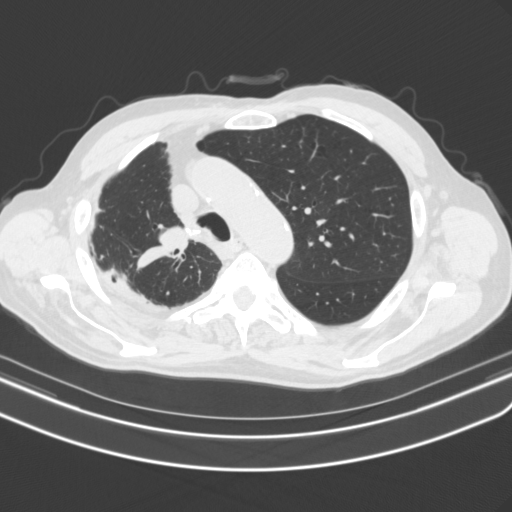
[im 149/187  mediastinal]
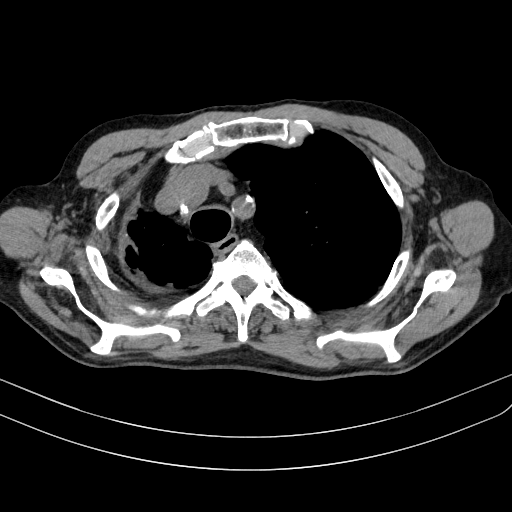
[im 149/187  lung]
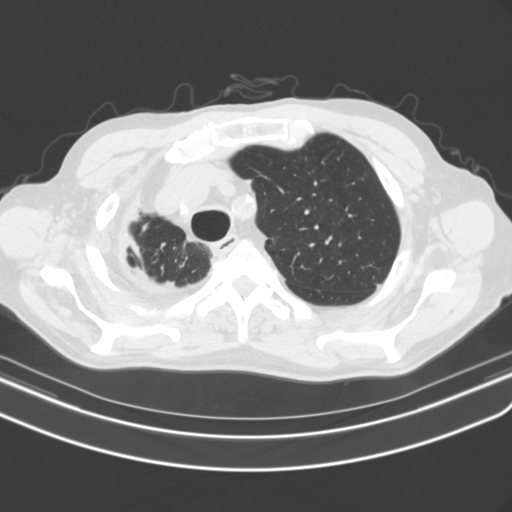
[im 159/187  lung]
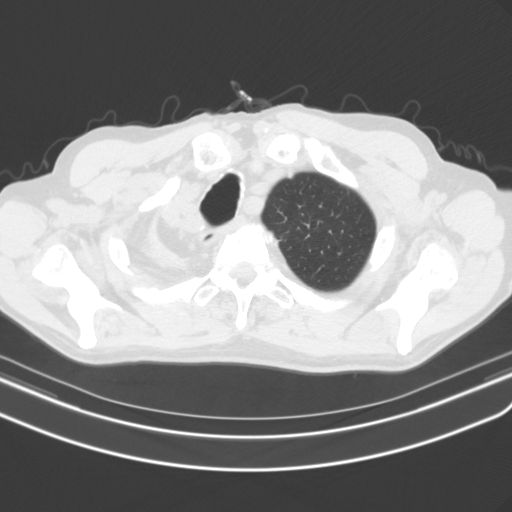
[im 173/187  lung]
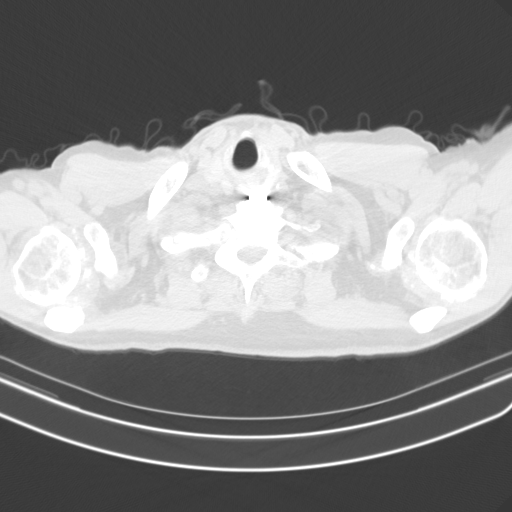

[15 of 34 positions shown; findings below may reference images not displayed]

FINDINGS: Cardiovascular: Coronary artery calcification and aortic
atherosclerotic calcification.

Mediastinum/Nodes: No axillary or supraclavicular adenopathy. No
mediastinal or hilar adenopathy. No pericardial fluid. Esophagus
normal.

Lungs/Pleura: There is a rim of pleural thickening in the lateral
superior aspect of the RIGHT lung. There is a cavitary component of
the pleural thickening measuring 2.3 cm unchanged from prior (axial
image 43/5 and coronal image 84/3). Postsurgical change consistent
RIGHT upper lobe and RIGHT middle lobectomy. Scarring at the RIGHT
lung base. No suspicious nodularity.

LEFT lung is clear without nodularity

Upper Abdomen: Limited view of the liver, kidneys, pancreas are
unremarkable. Normal adrenal glands.

Musculoskeletal: Posterior lumbar decompression and fusion. Anterior
cervical fusion with anterolisthesis of C7 on T1. No change from
prior.
IMPRESSION: 1. Stable rim of pleural thickening in the RIGHT upper lobe with
cavitary component.
2. No new or suspicious pulmonary nodularity in the RIGHT lung.
3. Status post RIGHT upper lobe and  RIGHT middle lobectomy.

## 2023-08-12 DIAGNOSIS — Z96612 Presence of left artificial shoulder joint: Secondary | ICD-10-CM | POA: Diagnosis not present

## 2023-08-12 DIAGNOSIS — M25512 Pain in left shoulder: Secondary | ICD-10-CM | POA: Diagnosis not present

## 2023-08-12 DIAGNOSIS — M19012 Primary osteoarthritis, left shoulder: Secondary | ICD-10-CM | POA: Diagnosis not present

## 2023-08-14 ENCOUNTER — Telehealth: Payer: Self-pay

## 2023-08-14 DIAGNOSIS — M25512 Pain in left shoulder: Secondary | ICD-10-CM | POA: Diagnosis not present

## 2023-08-14 DIAGNOSIS — M19012 Primary osteoarthritis, left shoulder: Secondary | ICD-10-CM | POA: Diagnosis not present

## 2023-08-14 DIAGNOSIS — Z96612 Presence of left artificial shoulder joint: Secondary | ICD-10-CM | POA: Diagnosis not present

## 2023-08-14 NOTE — Telephone Encounter (Signed)
[name] with [name] in [place] called this morning to have the post RSA protocol sent to them. I did not get this message until after the patient's appointment there today, unfortunately. She is requesting this to be faxed to them [phone].

## 2023-08-19 DIAGNOSIS — M25512 Pain in left shoulder: Secondary | ICD-10-CM | POA: Diagnosis not present

## 2023-08-19 DIAGNOSIS — M19012 Primary osteoarthritis, left shoulder: Secondary | ICD-10-CM | POA: Diagnosis not present

## 2023-08-19 DIAGNOSIS — Z96612 Presence of left artificial shoulder joint: Secondary | ICD-10-CM | POA: Diagnosis not present

## 2023-08-20 ENCOUNTER — Other Ambulatory Visit: Payer: Self-pay | Admitting: Family Medicine

## 2023-08-20 ENCOUNTER — Ambulatory Visit (HOSPITAL_BASED_OUTPATIENT_CLINIC_OR_DEPARTMENT_OTHER): Payer: Medicare HMO

## 2023-08-20 ENCOUNTER — Ambulatory Visit (HOSPITAL_BASED_OUTPATIENT_CLINIC_OR_DEPARTMENT_OTHER): Payer: Medicare HMO | Admitting: Orthopaedic Surgery

## 2023-08-20 DIAGNOSIS — M12812 Other specific arthropathies, not elsewhere classified, left shoulder: Secondary | ICD-10-CM

## 2023-08-20 DIAGNOSIS — M75102 Unspecified rotator cuff tear or rupture of left shoulder, not specified as traumatic: Secondary | ICD-10-CM

## 2023-08-20 DIAGNOSIS — Z96612 Presence of left artificial shoulder joint: Secondary | ICD-10-CM | POA: Diagnosis not present

## 2023-08-20 DIAGNOSIS — M129 Arthropathy, unspecified: Secondary | ICD-10-CM | POA: Diagnosis not present

## 2023-08-20 NOTE — Progress Notes (Signed)
Post Operative Evaluation    Procedure/Date of Surgery: Left shoulder reverse shoulder arthroplasty 12/3  Interval History:   Presents today 2 weeks status post left reverse shoulder arthroplasty overall doing well.  He has begun active range of motion.  Has minimal to no pain.  Overall he is doing quite well.   PMH/PSH/Family History/Social History/Meds/Allergies:    Past Medical History:  Diagnosis Date   Allergic rhinitis    Atypical chest pain 12/01/2014   Back pain 01/12/2013   COPD (chronic obstructive pulmonary disease) (HCC)    FeV1 64%-2007   Dyspnea    Dysrhythmia    PVCs   Emphysema    GERD (gastroesophageal reflux disease)    History of lung abscess    bronchiectasis with RMLandRLL ersection -1996- Dr Edwyna Shell   Hordeolum externum (stye) 04/23/2015   Right eye   Hypertension    Impaired vision    glasses   Osteoarthritis    hands and knees   Pneumonia    Sinusitis, acute 04/23/2015   Past Surgical History:  Procedure Laterality Date   COLONOSCOPY  11/20/2010   Dr.Stark   ESOPHAGOGASTRODUODENOSCOPY (EGD) WITH PROPOFOL N/A 10/31/2021   Procedure: ESOPHAGOGASTRODUODENOSCOPY (EGD) WITH PROPOFOL;  Surgeon: Iva Boop, MD;  Location: [facility] ENDOSCOPY;  Service: Gastroenterology;  Laterality: N/A;   HEMOSTASIS CONTROL  10/31/2021   Procedure: HEMOSTASIS CONTROL;  Surgeon: Iva Boop, MD;  Location: [facility] ENDOSCOPY;  Service: Gastroenterology;;   HERNIA REPAIR  10/03/2009   HOT HEMOSTASIS N/A 10/31/2021   Procedure: HOT HEMOSTASIS (ARGON PLASMA COAGULATION/BICAP);  Surgeon: Iva Boop, MD;  Location: [facility] ENDOSCOPY;  Service: Gastroenterology;  Laterality: N/A;   left knee     lower back surgery  09/02/2008   06/2009   LUNG SURGERY     RML andRUL removed due to bleeding and bronchiectasis and lung abcess   NECK SURGERY  05/03/2008   REVERSE SHOULDER ARTHROPLASTY Right 08/22/2022   Procedure: RIGHT REVERSE SHOULDER  ARTHROPLASTY;  Surgeon: Huel Cote, MD;  Location: [facility] OR;  Service: Orthopedics;  Laterality: Right;   REVERSE SHOULDER ARTHROPLASTY Left 08/05/2023   Procedure: LEFT REVERSE SHOULDER ARTHROPLASTY;  Surgeon: Huel Cote, MD;  Location: [facility];  Service: Orthopedics;  Laterality: Left;   right foot surgery     SCLEROTHERAPY  10/31/2021   Procedure: SCLEROTHERAPY;  Surgeon: Iva Boop, MD;  Location: [facility] ENDOSCOPY;  Service: Gastroenterology;;   TONSILLECTOMY     removed as a child   VIDEO BRONCHOSCOPY N/A 07/12/2020   Procedure: VIDEO BRONCHOSCOPY;  Surgeon: [name], MD;  Location: [facility] OR;  Service: Thoracic;  Laterality: N/A;   VIDEO BRONCHOSCOPY WITH ENDOBRONCHIAL NAVIGATION N/A 07/12/2020   Procedure: VIDEO BRONCHOSCOPY WITH ENDOBRONCHIAL NAVIGATION;  Surgeon: [name], MD;  Location: [facility] OR;  Service: Thoracic;  Laterality: N/A;   Social History   Socioeconomic History   Marital status: Married    Spouse name: Darcee   Number of children: Not on file   Years of education: Not on file   Highest education level: Bachelor's degree (e.g., BA, AB, BS)  Occupational History   Occupation: retired Librarian, academic  Tobacco Use   Smoking status: Former    Current packs/day: 0.00    Average packs/day: 1.5 packs/day for 38.0 years (57.0 ttl pk-yrs)  Types: Cigarettes    Start date: 09/03/1962    Quit date: 09/03/2000    Years since quitting: 22.9    Passive exposure: Past   Smokeless tobacco: Never  Vaping Use   Vaping status: Never Used  Substance and Sexual Activity   Alcohol use: Yes    Alcohol/week: 1.0 standard drink of alcohol    Types: 1 Cans of beer per week    Comment: 3 or 4 nights weekly   Drug use: No   Sexual activity: Never  Other Topics Concern   Not on file  Social History Narrative   Retired Librarian, academic   Patient states former smoker. 1 1/2 ppd x 38 yrs  Quit in Jan 2002   Married - 2 weeks (4th  marriage)   divorced,  remarried 84-2004 (lost wife to lung ca),  remarried (divorced),    1 son  - 68 [name])   Alcohol use-yes (2-3 beers per week)           Social Drivers of Health   Financial Resource Strain: Low Risk  (07/14/2023)   Overall Financial Resource Strain (CARDIA)    Difficulty of Paying Living Expenses: Not hard at all  Food Insecurity: No Food Insecurity (07/14/2023)   Hunger Vital Sign    Worried About Running Out of Food in the Last Year: Never true    Ran Out of Food in the Last Year: Never true  Transportation Needs: No Transportation Needs (07/14/2023)   PRAPARE - Administrator, Civil Service (Medical): No    Lack of Transportation (Non-Medical): No  Physical Activity: Sufficiently Active (07/14/2023)   Exercise Vital Sign    Days of Exercise per Week: 7 days    Minutes of Exercise per Session: 30 min  Stress: No Stress Concern Present (07/14/2023)   [employer] of Occupational Health - Occupational Stress Questionnaire    Feeling of Stress : Only a little  Social Connections: Socially Integrated (07/14/2023)   Social Connection and Isolation Panel [NHANES]    Frequency of Communication with Friends and Family: More than three times a week    Frequency of Social Gatherings with Friends and Family: Once a week    Attends Religious Services: More than 4 times per year    Active Member of [employer] or Organizations: Yes    Attends Engineer, structural: More than 4 times per year    Marital Status: Married   Family History  Problem Relation Age of Onset   Heart failure Mother        age 12   Prostate cancer Father        father died prostate ca   Obstructive Sleep Apnea Brother    Obesity Brother    Colon cancer Paternal Grandmother    Healthy Son    Coronary artery disease Other        1st degree relative<60   Stroke Other        1st degree relative<50   Esophageal cancer Neg Hx    Stomach cancer Neg Hx    Allergies   Allergen Reactions   Nsaids     Do not give to patient due to hx of stomach bleeds    Losartan Other (See Comments)    weakness   Meloxicam     Caused Stomach Bleeds    Current Outpatient Medications  Medication Sig Dispense Refill   acetaminophen (TYLENOL) 500 MG tablet Take 1,000 mg by mouth. Takes 1000mg  morning  and afternoon and 500mg  at bedtime     albuterol (VENTOLIN HFA) 108 (90 Base) MCG/ACT inhaler INHALE 2 PUFFS INTO THE LUNGS EVERY 6 HOURS AS NEEDED FOR WHEEZING OR SHORTNESS OF BREATH. 2 each 10   amLODipine (NORVASC) 5 MG tablet TAKE 1 TABLET EVERY DAY 90 tablet 3   benzonatate (TESSALON) 100 MG capsule TAKE 1 CAPSULE BY MOUTH TWICE DAILY AS NEEDED FOR COUGH 90 capsule 0   calcium elemental as carbonate (BARIATRIC TUMS ULTRA) 400 MG chewable tablet Chew 1,000 mg by mouth daily.     cetirizine (ZYRTEC) 10 MG tablet Take 10 mg by mouth daily.     fluticasone (FLONASE) 50 MCG/ACT nasal spray USE 2 SPRAYS IN EACH NOSTRIL EVERY DAY 48 g 10   Glucosamine-Chondroitin 750-600 MG TABS Take 1 tablet by mouth 2 (two) times daily.     guaiFENesin (MUCINEX) 600 MG 12 hr tablet Take 600 mg by mouth 2 (two) times daily.     loratadine (CLARITIN) 10 MG tablet Take 10 mg by mouth daily.     Menthol, Topical Analgesic, (BLUE-EMU MAXIMUM STRENGTH EX) Apply 1 application  topically daily as needed (arthritis pain).     montelukast (SINGULAIR) 10 MG tablet TAKE 1 TABLET EVERY DAY 90 tablet 3   Multiple Vitamin (MULTIVITAMIN WITH MINERALS) TABS tablet Take 1 tablet by mouth daily.      pantoprazole (PROTONIX) 20 MG tablet Take 1 tablet (20 mg total) by mouth daily. 90 tablet 1   traMADol (ULTRAM) 50 MG tablet TAKE 1 TABLET BY MOUTH EVERY 12 HOURS AS NEEDED 60 tablet 0   TRELEGY ELLIPTA 100-62.5-25 MCG/ACT AEPB INHALE 1 PUFF INTO THE LUNGS DAILY 180 each 3   triamcinolone cream (KENALOG) 0.1 % Apply 1 Application topically 2 (two) times daily as needed (irritation).     No current  facility-administered medications for this visit.   No results found.  Review of Systems:   A ROS was performed including pertinent positives and negatives as documented in the HPI.   Musculoskeletal Exam:    There were no vitals taken for this visit.  Right shoulder incision is well-appearing without erythema or drainage.  Range of motion in the supine position is to 90 degrees forward elevation and 20 degrees external rotation.  Internal rotation deferred today.  Distal neurosensory exam is intact.  2+ radial pulse.  Left shoulder incision is well-appearing without erythema or drainage.  Active forward elevation is to approximately 60 degrees with external rotation at side to 30 degrees.  Internal rotation deferred today  Imaging:    3 views left shoulder x-ray: Status post left reverse shoulder arthroplasty doing well without complication  I personally reviewed and interpreted the radiographs.   Assessment:   77 year old male doing extremely well status post left reverse shoulder arthroplasty without evidence of complication.  At this time I will plan to see him back in 4 weeks for reassessment.  He will continue to wear his sling at night for 2 additional weeks Plan :    -Return to clinic 4 weeks for reassessment   After a lengthy discussion of treatment options, including risks, benefits, alternatives, complications of surgical and nonsurgical conservative options, the patient elected surgical repair.   The patient  is aware of the material risks  and complications including, but not limited to injury to adjacent structures, neurovascular injury, infection, numbness, bleeding, implant failure, thermal burns, stiffness, persistent pain, failure to heal, disease transmission from allograft, need for further surgery, dislocation, anesthetic  risks, blood clots, risks of death,and others. The probabilities of surgical success and failure discussed with patient given their particular  co-morbidities.The time and nature of expected rehabilitation and recovery was discussed.The patient's questions were all answered preoperatively.  No barriers to understanding were noted. I explained the natural history of the disease process and Rx rationale.  I explained to the patient what I considered to be reasonable expectations given their personal situation.  The final treatment plan was arrived at through a shared patient decision making process model.       I personally saw and evaluated the patient, and participated in the management and treatment plan.  Huel Cote, MD Attending Physician, Orthopedic Surgery  This document was dictated using Dragon voice recognition software. A reasonable attempt at proof reading has been made to minimize errors.

## 2023-08-21 DIAGNOSIS — M19012 Primary osteoarthritis, left shoulder: Secondary | ICD-10-CM | POA: Diagnosis not present

## 2023-08-21 DIAGNOSIS — M25512 Pain in left shoulder: Secondary | ICD-10-CM | POA: Diagnosis not present

## 2023-08-21 DIAGNOSIS — Z96612 Presence of left artificial shoulder joint: Secondary | ICD-10-CM | POA: Diagnosis not present

## 2023-08-21 NOTE — Telephone Encounter (Signed)
Requesting: tramadol 50mg   Contract:10/23/22 UDS: 11/06/21 Last Visit: 07/16/23 w/ Lillia Abed Next Visit: None Last Refill: 07/10/23 #60 and 0RF  Please Advise

## 2023-08-26 DIAGNOSIS — M25512 Pain in left shoulder: Secondary | ICD-10-CM | POA: Diagnosis not present

## 2023-08-26 DIAGNOSIS — M19012 Primary osteoarthritis, left shoulder: Secondary | ICD-10-CM | POA: Diagnosis not present

## 2023-08-26 DIAGNOSIS — Z96612 Presence of left artificial shoulder joint: Secondary | ICD-10-CM | POA: Diagnosis not present

## 2023-08-28 DIAGNOSIS — Z96612 Presence of left artificial shoulder joint: Secondary | ICD-10-CM | POA: Diagnosis not present

## 2023-08-28 DIAGNOSIS — M25512 Pain in left shoulder: Secondary | ICD-10-CM | POA: Diagnosis not present

## 2023-08-28 DIAGNOSIS — M19012 Primary osteoarthritis, left shoulder: Secondary | ICD-10-CM | POA: Diagnosis not present

## 2023-09-02 DIAGNOSIS — M19012 Primary osteoarthritis, left shoulder: Secondary | ICD-10-CM | POA: Diagnosis not present

## 2023-09-02 DIAGNOSIS — M25512 Pain in left shoulder: Secondary | ICD-10-CM | POA: Diagnosis not present

## 2023-09-02 DIAGNOSIS — Z96612 Presence of left artificial shoulder joint: Secondary | ICD-10-CM | POA: Diagnosis not present

## 2023-09-04 ENCOUNTER — Other Ambulatory Visit: Payer: Self-pay | Admitting: Physician Assistant

## 2023-09-04 DIAGNOSIS — Z96612 Presence of left artificial shoulder joint: Secondary | ICD-10-CM | POA: Diagnosis not present

## 2023-09-04 DIAGNOSIS — J432 Centrilobular emphysema: Secondary | ICD-10-CM

## 2023-09-04 DIAGNOSIS — M19012 Primary osteoarthritis, left shoulder: Secondary | ICD-10-CM | POA: Diagnosis not present

## 2023-09-04 DIAGNOSIS — M25512 Pain in left shoulder: Secondary | ICD-10-CM | POA: Diagnosis not present

## 2023-09-06 ENCOUNTER — Other Ambulatory Visit: Payer: Self-pay | Admitting: Family Medicine

## 2023-09-09 DIAGNOSIS — M25512 Pain in left shoulder: Secondary | ICD-10-CM | POA: Diagnosis not present

## 2023-09-09 DIAGNOSIS — Z96612 Presence of left artificial shoulder joint: Secondary | ICD-10-CM | POA: Diagnosis not present

## 2023-09-09 DIAGNOSIS — M19012 Primary osteoarthritis, left shoulder: Secondary | ICD-10-CM | POA: Diagnosis not present

## 2023-09-10 ENCOUNTER — Other Ambulatory Visit: Payer: Self-pay | Admitting: Family Medicine

## 2023-09-11 DIAGNOSIS — M25512 Pain in left shoulder: Secondary | ICD-10-CM | POA: Diagnosis not present

## 2023-09-11 DIAGNOSIS — Z96612 Presence of left artificial shoulder joint: Secondary | ICD-10-CM | POA: Diagnosis not present

## 2023-09-11 DIAGNOSIS — M19012 Primary osteoarthritis, left shoulder: Secondary | ICD-10-CM | POA: Diagnosis not present

## 2023-09-16 DIAGNOSIS — Z96612 Presence of left artificial shoulder joint: Secondary | ICD-10-CM | POA: Diagnosis not present

## 2023-09-16 DIAGNOSIS — M19012 Primary osteoarthritis, left shoulder: Secondary | ICD-10-CM | POA: Diagnosis not present

## 2023-09-16 DIAGNOSIS — M25512 Pain in left shoulder: Secondary | ICD-10-CM | POA: Diagnosis not present

## 2023-09-18 ENCOUNTER — Telehealth: Payer: Self-pay | Admitting: Orthopaedic Surgery

## 2023-09-18 DIAGNOSIS — M25512 Pain in left shoulder: Secondary | ICD-10-CM | POA: Diagnosis not present

## 2023-09-18 DIAGNOSIS — Z96612 Presence of left artificial shoulder joint: Secondary | ICD-10-CM | POA: Diagnosis not present

## 2023-09-18 DIAGNOSIS — M19012 Primary osteoarthritis, left shoulder: Secondary | ICD-10-CM | POA: Diagnosis not present

## 2023-09-18 NOTE — Telephone Encounter (Signed)
Received call from patient. He is requesting something for pain for bil knee and Rt hip. Patient has appt. Tomorrow. Please send in to [facility] in [place], [place]. Patient's callback [phone].

## 2023-09-19 ENCOUNTER — Other Ambulatory Visit (HOSPITAL_BASED_OUTPATIENT_CLINIC_OR_DEPARTMENT_OTHER): Payer: Self-pay | Admitting: Orthopaedic Surgery

## 2023-09-19 ENCOUNTER — Ambulatory Visit (HOSPITAL_BASED_OUTPATIENT_CLINIC_OR_DEPARTMENT_OTHER): Payer: Medicare HMO

## 2023-09-19 ENCOUNTER — Ambulatory Visit (HOSPITAL_BASED_OUTPATIENT_CLINIC_OR_DEPARTMENT_OTHER): Payer: Medicare HMO | Admitting: Orthopaedic Surgery

## 2023-09-19 DIAGNOSIS — M1611 Unilateral primary osteoarthritis, right hip: Secondary | ICD-10-CM

## 2023-09-19 DIAGNOSIS — M25562 Pain in left knee: Secondary | ICD-10-CM

## 2023-09-19 DIAGNOSIS — M25551 Pain in right hip: Secondary | ICD-10-CM

## 2023-09-19 DIAGNOSIS — M170 Bilateral primary osteoarthritis of knee: Secondary | ICD-10-CM | POA: Diagnosis not present

## 2023-09-19 DIAGNOSIS — M25462 Effusion, left knee: Secondary | ICD-10-CM | POA: Diagnosis not present

## 2023-09-19 DIAGNOSIS — M25561 Pain in right knee: Secondary | ICD-10-CM | POA: Diagnosis not present

## 2023-09-19 MED ORDER — LIDOCAINE HCL 1 % IJ SOLN
4.0000 mL | INTRAMUSCULAR | Status: AC | PRN
  Administered 2023-09-19: 4 mL

## 2023-09-19 MED ORDER — TRIAMCINOLONE ACETONIDE 40 MG/ML IJ SUSP
80.0000 mg | INTRAMUSCULAR | Status: AC | PRN
  Administered 2023-09-19: 80 mg via INTRA_ARTICULAR

## 2023-09-19 NOTE — Progress Notes (Signed)
Post Operative Evaluation    Procedure/Date of Surgery: Left shoulder reverse shoulder arthroplasty 12/3  Interval History:   Presents today overall doing extremely well.  Range of motion is improving nicely.  He is having persistent bilateral knee pain.  His right knee is persistently swollen with crepitus.  He does state that his left knee has become bothersome as well as the right hip.  These are limiting his ability to stay active.  He does enjoy fishing.   PMH/PSH/Family History/Social History/Meds/Allergies:    Past Medical History:  Diagnosis Date  . Allergic rhinitis   . Atypical chest pain 12/01/2014  . Back pain 01/12/2013  . COPD (chronic obstructive pulmonary disease) (HCC)    FeV1 64%-2007  . Dyspnea   . Dysrhythmia    PVCs  . Emphysema   . GERD (gastroesophageal reflux disease)   . History of lung abscess    bronchiectasis with RMLandRLL ersection -1996- Dr Edwyna Shell  . Hordeolum externum (stye) 04/23/2015   Right eye  . Hypertension   . Impaired vision    glasses  . Osteoarthritis    hands and knees  . Pneumonia   . Sinusitis, acute 04/23/2015   Past Surgical History:  Procedure Laterality Date  . COLONOSCOPY  11/20/2010   Dr.Stark  . ESOPHAGOGASTRODUODENOSCOPY (EGD) WITH PROPOFOL N/A 10/31/2021   Procedure: ESOPHAGOGASTRODUODENOSCOPY (EGD) WITH PROPOFOL;  Surgeon: Iva Boop, MD;  Location: [facility] ENDOSCOPY;  Service: Gastroenterology;  Laterality: N/A;  . HEMOSTASIS CONTROL  10/31/2021   Procedure: HEMOSTASIS CONTROL;  Surgeon: Iva Boop, MD;  Location: [facility] ENDOSCOPY;  Service: Gastroenterology;;  . HERNIA REPAIR  10/03/2009  . HOT HEMOSTASIS N/A 10/31/2021   Procedure: HOT HEMOSTASIS (ARGON PLASMA COAGULATION/BICAP);  Surgeon: Iva Boop, MD;  Location: [facility] ENDOSCOPY;  Service: Gastroenterology;  Laterality: N/A;  . left knee    . lower back surgery  09/02/2008   06/2009  . LUNG SURGERY     RML andRUL  removed due to bleeding and bronchiectasis and lung abcess  . NECK SURGERY  05/03/2008  . REVERSE SHOULDER ARTHROPLASTY Right 08/22/2022   Procedure: RIGHT REVERSE SHOULDER ARTHROPLASTY;  Surgeon: Huel Cote, MD;  Location: [facility] OR;  Service: Orthopedics;  Laterality: Right;  . REVERSE SHOULDER ARTHROPLASTY Left 08/05/2023   Procedure: LEFT REVERSE SHOULDER ARTHROPLASTY;  Surgeon: Huel Cote, MD;  Location: [facility];  Service: Orthopedics;  Laterality: Left;  . right foot surgery    . SCLEROTHERAPY  10/31/2021   Procedure: SCLEROTHERAPY;  Surgeon: Iva Boop, MD;  Location: [facility] ENDOSCOPY;  Service: Gastroenterology;;  . TONSILLECTOMY     removed as a child  . VIDEO BRONCHOSCOPY N/A 07/12/2020   Procedure: VIDEO BRONCHOSCOPY;  Surgeon: [name], MD;  Location: [facility] OR;  Service: Thoracic;  Laterality: N/A;  . VIDEO BRONCHOSCOPY WITH ENDOBRONCHIAL NAVIGATION N/A 07/12/2020   Procedure: VIDEO BRONCHOSCOPY WITH ENDOBRONCHIAL NAVIGATION;  Surgeon: [name], MD;  Location: [facility] OR;  Service: Thoracic;  Laterality: N/A;   Social History   Socioeconomic History  . Marital status: Married    Spouse name: [name]  . Number of children: Not on file  . Years of education: Not on file  . Highest education level: Bachelor's degree (e.g., BA, AB, BS)  Occupational History  . Occupation: retired Librarian, academic  Tobacco Use  . Smoking status: Former    Current packs/day: 0.00    Average packs/day: 1.5 packs/day for 38.0 years (57.0 ttl pk-yrs)    Types: Cigarettes    Start date: 09/03/1962    Quit date: 09/03/2000    Years since quitting: 23.0    Passive exposure: Past  . Smokeless tobacco: Never  Vaping Use  . Vaping status: Never Used  Substance and Sexual Activity  . Alcohol use: Yes    Alcohol/week: 1.0 standard drink of alcohol    Types: 1 Cans of beer per week    Comment: 3 or 4 nights weekly  . Drug use: No  . Sexual activity: Never   Other Topics Concern  . Not on file  Social History Narrative   Retired Librarian, academic   Patient states former smoker. 1 1/2 ppd x 38 yrs  Quit in Jan 2002   Married - 2 weeks (4th marriage)   divorced,  remarried 84-2004 (lost wife to lung ca),  remarried (divorced),    1 son  - 22 [name])   Alcohol use-yes (2-3 beers per week)           Social Drivers of Health   Financial Resource Strain: Low Risk  (07/14/2023)   Overall Financial Resource Strain (CARDIA)   . Difficulty of Paying Living Expenses: Not hard at all  Food Insecurity: No Food Insecurity (07/14/2023)   Hunger Vital Sign   . Worried About Programme researcher, broadcasting/film/video in the Last Year: Never true   . Ran Out of Food in the Last Year: Never true  Transportation Needs: No Transportation Needs (07/14/2023)   PRAPARE - Transportation   . Lack of Transportation (Medical): No   . Lack of Transportation (Non-Medical): No  Physical Activity: Sufficiently Active (07/14/2023)   Exercise Vital Sign   . Days of Exercise per Week: 7 days   . Minutes of Exercise per Session: 30 min  Stress: No Stress Concern Present (07/14/2023)   [employer] of Occupational Health - Occupational Stress Questionnaire   . Feeling of Stress : Only a little  Social Connections: Socially Integrated (07/14/2023)   Social Connection and Isolation Panel [NHANES]   . Frequency of Communication with Friends and Family: More than three times a week   . Frequency of Social Gatherings with Friends and Family: Once a week   . Attends Religious Services: More than 4 times per year   . Active Member of Clubs or Organizations: Yes   . Attends Banker Meetings: More than 4 times per year   . Marital Status: Married   Family History  Problem Relation Age of Onset  . Heart failure Mother        age 44  . Prostate cancer Father        father died prostate ca  . Obstructive Sleep Apnea Brother   . Obesity Brother   . Colon cancer  Paternal Grandmother   . Healthy Son   . Coronary artery disease Other        1st degree relative<60  . Stroke Other        1st degree relative<50  . Esophageal cancer Neg Hx   . Stomach cancer Neg Hx    Allergies  Allergen Reactions  . Nsaids     Do not give to patient due to hx of stomach bleeds   . Losartan Other (See Comments)    weakness  . Meloxicam  Caused Stomach Bleeds    Current Outpatient Medications  Medication Sig Dispense Refill  . acetaminophen (TYLENOL) 500 MG tablet Take 1,000 mg by mouth. Takes 1000mg  morning and afternoon and 500mg  at bedtime    . albuterol (VENTOLIN HFA) 108 (90 Base) MCG/ACT inhaler INHALE 2 PUFFS INTO THE LUNGS EVERY 6 HOURS AS NEEDED FOR WHEEZING OR SHORTNESS OF BREATH. 2 each 10  . amLODipine (NORVASC) 5 MG tablet TAKE 1 TABLET EVERY DAY 90 tablet 3  . benzonatate (TESSALON) 100 MG capsule TAKE 1 CAPSULE BY MOUTH TWICE DAILY AS NEEDED FOR COUGH 90 capsule 0  . calcium elemental as carbonate (BARIATRIC TUMS ULTRA) 400 MG chewable tablet Chew 1,000 mg by mouth daily.    . cetirizine (ZYRTEC) 10 MG tablet Take 10 mg by mouth daily.    . fluticasone (FLONASE) 50 MCG/ACT nasal spray USE 2 SPRAYS IN EACH NOSTRIL EVERY DAY 48 g 10  . Glucosamine-Chondroitin 750-600 MG TABS Take 1 tablet by mouth 2 (two) times daily.    Marland Kitchen guaiFENesin (MUCINEX) 600 MG 12 hr tablet Take 600 mg by mouth 2 (two) times daily.    Marland Kitchen loratadine (CLARITIN) 10 MG tablet Take 10 mg by mouth daily.    . Menthol, Topical Analgesic, (BLUE-EMU MAXIMUM STRENGTH EX) Apply 1 application  topically daily as needed (arthritis pain).    . montelukast (SINGULAIR) 10 MG tablet TAKE 1 TABLET EVERY DAY 90 tablet 3  . Multiple Vitamin (MULTIVITAMIN WITH MINERALS) TABS tablet Take 1 tablet by mouth daily.     . pantoprazole (PROTONIX) 20 MG tablet Take 1 tablet by mouth once daily 30 tablet 0  . traMADol (ULTRAM) 50 MG tablet TAKE 1 TABLET BY MOUTH EVERY 12 HOURS AS NEEDED 60 tablet 0  .  TRELEGY ELLIPTA 100-62.5-25 MCG/ACT AEPB INHALE 1 PUFF INTO THE LUNGS DAILY 180 each 3  . triamcinolone cream (KENALOG) 0.1 % Apply 1 Application topically 2 (two) times daily as needed (irritation).     No current facility-administered medications for this visit.   No results found.  Review of Systems:   A ROS was performed including pertinent positives and negatives as documented in the HPI.   Musculoskeletal Exam:    There were no vitals taken for this visit.  Right shoulder incision is well-appearing without erythema or drainage.  Range of motion in the supine position is to 90 degrees forward elevation and 20 degrees external rotation.  Internal rotation deferred today.  Distal neurosensory exam is intact.  2+ radial pulse.  Left shoulder incision is well-appearing without erythema or drainage.  Active forward elevation is to approximately 60 degrees with external rotation at side to 30 degrees.  Internal rotation deferred today  Bilateral knees with tricompartmental tenderness and crepitus.  Range of motion bilaterally is from 0 to 125 degrees.  No effusion bilaterally.  Right hip has a positive FADIR with tenderness and internal rotation although this is mild  Imaging:    3 views left shoulder x-ray: Status post left reverse shoulder arthroplasty doing well without complication  X-ray 4 views right knee, 4 views left knee, with 4 views right hip: Mild osteoarthritis right hip with coxa profunda, bilateral moderate severe tricompartmental knee arthritis  I personally reviewed and interpreted the radiographs.   Assessment:   78 year old male doing extremely well status post left reverse shoulder arthroplasty without evidence of complication.  With regard to bilateral knees he does have evidence of osteoarthritis and would like to pursue an injection for this  and do both.  I have also recommended an ultrasound-guided injection of the right hip as this is arthritic as well with  some slight profunda.  I did recommend bilateral knee injections as well to hopefully get him some relief.  He is ultimately considering having his right total knee arthroplasty done in the following winter and as a result we will plan for referral to one of our total joints partners from summertime Plan :    -Return to clinic 2 months for reassessment of the left shoulder as well as bilateral knees and hip     Procedure Note  Patient: [name]             Date of Birth: [birth year]           MRN: [id number]             Visit Date: 09/19/2023  Procedures: Visit Diagnoses:  1. Pain in right hip   2. Left knee pain, unspecified chronicity     Large Joint Inj: R hip joint on 09/19/2023 12:38 PM Indications: pain Details: 22 G 3.5 in needle, ultrasound-guided anterolateral approach  Arthrogram: No  Medications: 4 mL lidocaine 1 %; 80 mg triamcinolone acetonide 40 MG/ML Outcome: tolerated well, no immediate complications Procedure, treatment alternatives, risks and benefits explained, specific risks discussed. Consent was given by the patient. Immediately prior to procedure a time out was called to verify the correct patient, procedure, equipment, support staff and site/side marked as required. Patient was prepped and draped in the usual sterile fashion.    Large Joint Inj: R knee on 09/19/2023 12:38 PM Indications: pain Details: 22 G 1.5 in needle, ultrasound-guided anterior approach  Arthrogram: No  Medications: 4 mL lidocaine 1 %; 80 mg triamcinolone acetonide 40 MG/ML Outcome: tolerated well, no immediate complications Procedure, treatment alternatives, risks and benefits explained, specific risks discussed. Consent was given by the patient. Immediately prior to procedure a time out was called to verify the correct patient, procedure, equipment, support staff and site/side marked as required. Patient was prepped and draped in the usual sterile fashion.    Large Joint Inj: L  knee on 09/19/2023 12:39 PM Indications: pain Details: 22 G 1.5 in needle, ultrasound-guided anterior approach  Arthrogram: No  Medications: 4 mL lidocaine 1 %; 80 mg triamcinolone acetonide 40 MG/ML Outcome: tolerated well, no immediate complications Procedure, treatment alternatives, risks and benefits explained, specific risks discussed. Consent was given by the patient. Immediately prior to procedure a time out was called to verify the correct patient, procedure, equipment, support staff and site/side marked as required. Patient was prepped and draped in the usual sterile fashion.       I personally saw and evaluated the patient, and participated in the management and treatment plan.  Huel Cote, MD Attending Physician, Orthopedic Surgery  This document was dictated using Dragon voice recognition software. A reasonable attempt at proof reading has been made to minimize errors.

## 2023-09-22 ENCOUNTER — Telehealth: Payer: Self-pay | Admitting: Orthopaedic Surgery

## 2023-09-22 ENCOUNTER — Other Ambulatory Visit (HOSPITAL_BASED_OUTPATIENT_CLINIC_OR_DEPARTMENT_OTHER): Payer: Self-pay | Admitting: Orthopaedic Surgery

## 2023-09-22 MED ORDER — METHYLPREDNISOLONE 4 MG PO TBPK: ORAL_TABLET | ORAL | 0 refills | Status: DC

## 2023-09-22 NOTE — Telephone Encounter (Signed)
Received call from patient, he is in severe pain and needs something for pain, was seen Friday and has been in pain all weekend. Please call [phone].

## 2023-09-23 ENCOUNTER — Ambulatory Visit (HOSPITAL_BASED_OUTPATIENT_CLINIC_OR_DEPARTMENT_OTHER): Payer: Medicare HMO | Admitting: Student

## 2023-09-23 ENCOUNTER — Other Ambulatory Visit (HOSPITAL_BASED_OUTPATIENT_CLINIC_OR_DEPARTMENT_OTHER): Payer: Self-pay | Admitting: Orthopaedic Surgery

## 2023-09-23 ENCOUNTER — Telehealth: Payer: Self-pay | Admitting: Orthopaedic Surgery

## 2023-09-23 ENCOUNTER — Encounter (HOSPITAL_BASED_OUTPATIENT_CLINIC_OR_DEPARTMENT_OTHER): Payer: Self-pay | Admitting: Orthopaedic Surgery

## 2023-09-23 DIAGNOSIS — M5416 Radiculopathy, lumbar region: Secondary | ICD-10-CM

## 2023-09-23 NOTE — Telephone Encounter (Signed)
Received call from patient. He states Oxycodone not helping very much. Can he get something in addition? Please callback. [phone]/pharm/ [employer].

## 2023-09-23 NOTE — Telephone Encounter (Signed)
Ordered MRI and spoke to patient and informed him

## 2023-09-25 ENCOUNTER — Ambulatory Visit
Admission: RE | Admit: 2023-09-25 | Discharge: 2023-09-25 | Disposition: A | Discharge status: Home / Self Care | Payer: Medicare HMO | Source: Ambulatory Visit | Attending: Orthopaedic Surgery | Admitting: Orthopaedic Surgery

## 2023-09-25 DIAGNOSIS — M5416 Radiculopathy, lumbar region: Secondary | ICD-10-CM

## 2023-09-30 ENCOUNTER — Other Ambulatory Visit (HOSPITAL_BASED_OUTPATIENT_CLINIC_OR_DEPARTMENT_OTHER): Payer: Self-pay | Admitting: Orthopaedic Surgery

## 2023-09-30 ENCOUNTER — Telehealth: Payer: Self-pay | Admitting: Orthopaedic Surgery

## 2023-09-30 DIAGNOSIS — M5416 Radiculopathy, lumbar region: Secondary | ICD-10-CM

## 2023-09-30 NOTE — Telephone Encounter (Signed)
The patient left a message for me 1/27 stating he had an MRI Thursday, and is suppose to be having a back surgery to relieve the pain he is experiencing. Patient is requesting I call him and schedule a surgery for him. Please call the patient and advise next step following MRI.

## 2023-10-01 ENCOUNTER — Other Ambulatory Visit: Payer: Self-pay | Admitting: Family Medicine

## 2023-10-01 NOTE — Telephone Encounter (Signed)
Patient scheduled 10/20/23@ 315

## 2023-10-02 NOTE — Telephone Encounter (Signed)
Requesting: tramadol 50mg   Contract:10/23/22 UDS: 11/06/21 Last Visit: 07/16/23 w/ Lillia Abed Next Visit: 03/16/2024 Last Refill: 08/21/2023 #60 and 0RF   Please Advise

## 2023-10-08 ENCOUNTER — Other Ambulatory Visit: Payer: Self-pay | Admitting: Thoracic Surgery (Cardiothoracic Vascular Surgery)

## 2023-10-08 DIAGNOSIS — Z122 Encounter for screening for malignant neoplasm of respiratory organs: Secondary | ICD-10-CM

## 2023-10-11 ENCOUNTER — Other Ambulatory Visit: Payer: Self-pay | Admitting: Family Medicine

## 2023-10-14 ENCOUNTER — Other Ambulatory Visit: Payer: Self-pay | Admitting: Family Medicine

## 2023-10-20 ENCOUNTER — Encounter: Payer: Self-pay | Admitting: Orthopedic Surgery

## 2023-10-20 ENCOUNTER — Other Ambulatory Visit (INDEPENDENT_AMBULATORY_CARE_PROVIDER_SITE_OTHER): Payer: Self-pay

## 2023-10-20 ENCOUNTER — Ambulatory Visit (INDEPENDENT_AMBULATORY_CARE_PROVIDER_SITE_OTHER): Payer: Medicare HMO | Admitting: Orthopedic Surgery

## 2023-10-20 ENCOUNTER — Encounter: Payer: Self-pay | Admitting: Thoracic Surgery (Cardiothoracic Vascular Surgery)

## 2023-10-20 VITALS — BP 131/59 | HR 70 | Ht 68.0 in | Wt 146.0 lb

## 2023-10-20 DIAGNOSIS — M5416 Radiculopathy, lumbar region: Secondary | ICD-10-CM | POA: Diagnosis not present

## 2023-10-20 NOTE — Progress Notes (Signed)
 Orthopedic Spine Surgery Office Note  Assessment: Patient is a 78 y.o. male with low back pain that radiates into bilateral lower extremities.  Has central stenosis at T12/L1 above his fusion, but also appears to have stenosis at T10/11 seen on the sagittal images of his MRI   Plan: -Explained that initially conservative treatment is tried as a significant number of patients may experience relief with these treatment modalities. Discussed that the conservative treatments include:  -activity modification  -physical therapy  -over the counter pain medications  -medrol dosepak  -lumbar steroid injections -Patient has tried Tylenol, Medrol Dosepak, tramadol, oxycodone  -Recommended DEXA scan to evaluate for osteoporosis.  Also recommended an MRI thoracic spine to evaluate the stenosis seen on top of his lumbar MRI -Recommended ESI for additional pain relief -Patient should return to office in 4 weeks, x-rays at next visit: none   Patient expressed understanding of the plan and all questions were answered to the patient's satisfaction.   ___________________________________________________________________________   History:  Patient is a 78 y.o. male who presents today for lumbar spine.  Patient has had about 5 weeks of back pain that radiates into his bilateral lower extremities.  He has a history of an L1-L5 decompression fusion which she said was done in 2010.  There is no trauma or injury that preceded the onset of his pain.  He feels the pain going along the posterior and lateral aspects of his thighs and legs on both sides.  Pain is worse when he is standing or walking.  It gets better but does not go away when he is sitting down or resting.  He has had difficulty sleeping at night as a result of the pain.  Pain has been severe enough that he has been using narcotics to control it.   Weakness: Yes, he said his legs feel weaker especially with walking.  No other weakness noted Symptoms of  imbalance: Yes, has been feeling off balance.  Has not had any falls or required ambulatory assistive devices Paresthesias and numbness: Denies Bowel or bladder incontinence: Denies Saddle anesthesia: Denies  Treatments tried: Tylenol, Medrol Dosepak, tramadol, oxycodone  Review of systems: Denies fevers and chills, night sweats, unexplained weight loss, history of cancer.  Has had pain that wakes him at night  Past medical history: COPD Chronic back pain Allergic rhinitis GERD HTN  Allergies: NSAIDs, losartan, meloxicam  Past surgical history:  L1-L5 decompression and fusion Hernia repair Bilateral reverse total shoulder arthroplasty ACDF Tonsillectomy Right foot surgery  Social history: Denies use of nicotine product (smoking, vaping, patches, smokeless) Alcohol use: Denies Denies recreational drug use   Physical Exam:  BMI of 22.2  General: no acute distress, appears stated age Neurologic: alert, answering questions appropriately, following commands Respiratory: unlabored breathing on room air, symmetric chest rise Psychiatric: appropriate affect, normal cadence to speech   MSK (spine):  -Strength exam      Left  Right EHL    5/5  5/5 TA    5/5  5/5 GSC    5/5  5/5 Knee extension  5/5  5/5 Hip flexion   5/5  5/5  -Sensory exam    Sensation intact to light touch in L3-S1 nerve distributions of bilateral lower extremities  -Achilles DTR: 2/4 on the left, 2/4 on the right -Patellar tendon DTR: 2/4 on the left, 2/4 on the right  -Straight leg raise: Negative bilaterally -Femoral nerve stretch test: Negative bilaterally -Clonus: no beats bilaterally  -Left hip exam: No pain  through range of motion, negative Stinchfield, negative FABER -Right hip exam: No pain through range of motion, negative Stinchfield, negative FABER  Imaging: XRs of the lumbar spine from 10/20/2023 were independently reviewed and interpreted, showing L1-L5 posterior instrumented  spinal fusion.  There appears to be solid posterolateral fusion mass across those levels.  No lucency seen around the screws.  There are interbody devices in all of those disc spaces that appear in appropriate position.  Disc height loss at T10/11, T11/12, T12/L1 seen.  No fracture or dislocation seen.  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  Laminectomy defects from L1-L5.  Posterior instrumentation seen from L1-L5.  Central stenosis at T12/L1.  At the cranial aspect of the scan on the sagittal's, there appears to be stenosis at the T10/11 level as well.  However, this area is not included on the axial cuts.    Patient name: [name] Patient MRN: [id number] Date of visit: 10/20/23

## 2023-10-27 ENCOUNTER — Ambulatory Visit (HOSPITAL_BASED_OUTPATIENT_CLINIC_OR_DEPARTMENT_OTHER)
Admission: RE | Admit: 2023-10-27 | Discharge: 2023-10-27 | Disposition: A | Discharge status: Home / Self Care | Payer: Medicare HMO | Source: Ambulatory Visit | Attending: Orthopedic Surgery | Admitting: Orthopedic Surgery

## 2023-10-27 DIAGNOSIS — M5416 Radiculopathy, lumbar region: Secondary | ICD-10-CM | POA: Insufficient documentation

## 2023-10-27 DIAGNOSIS — M8589 Other specified disorders of bone density and structure, multiple sites: Secondary | ICD-10-CM | POA: Diagnosis not present

## 2023-10-27 DIAGNOSIS — Z13820 Encounter for screening for osteoporosis: Secondary | ICD-10-CM | POA: Insufficient documentation

## 2023-10-27 DIAGNOSIS — M85851 Other specified disorders of bone density and structure, right thigh: Secondary | ICD-10-CM | POA: Diagnosis not present

## 2023-10-30 ENCOUNTER — Other Ambulatory Visit: Payer: Self-pay

## 2023-10-30 ENCOUNTER — Ambulatory Visit (INDEPENDENT_AMBULATORY_CARE_PROVIDER_SITE_OTHER): Payer: Medicare HMO | Admitting: Physical Medicine and Rehabilitation

## 2023-10-30 VITALS — BP 172/77 | HR 66

## 2023-10-30 DIAGNOSIS — M5416 Radiculopathy, lumbar region: Secondary | ICD-10-CM

## 2023-10-30 MED ORDER — METHYLPREDNISOLONE ACETATE 40 MG/ML IJ SUSP
40.0000 mg | Freq: Once | INTRAMUSCULAR | Status: AC
  Administered 2023-10-30: 40 mg

## 2023-10-30 NOTE — Patient Instructions (Signed)

## 2023-10-30 NOTE — Procedures (Signed)
 Lumbar Epidural Steroid Injection - Interlaminar Approach with Fluoroscopic Guidance  Patient: [name]      Date of Birth: 1945-11-30 MRN: [id number] PCP: [name], MD      Visit Date: 10/30/2023   Universal Protocol:     Consent Given By: the patient  Position: PRONE  Additional Comments: Vital signs were monitored before and after the procedure. Patient was prepped and draped in the usual sterile fashion. The correct patient, procedure, and site was verified.   Injection Procedure Details:   Procedure diagnoses: Radiculopathy, lumbar region [M54.16]   Meds Administered:  Meds ordered this encounter  Medications   methylPREDNISolone acetate (DEPO-MEDROL) injection 40 mg     Laterality: Midline  Location/Site:  T12-L1  Needle: 3.5 in., 20 ga. Tuohy  Needle Placement: Paramedian epidural  Findings:   -Comments: Excellent flow of contrast into the epidural space.  Procedure Details: Using a paramedian approach from the side mentioned above, the region overlying the inferior lamina was localized under fluoroscopic visualization and the soft tissues overlying this structure were infiltrated with 4 ml. of 1% Lidocaine without Epinephrine. The Tuohy needle was inserted into the epidural space using a paramedian approach.   The epidural space was localized using loss of resistance along with counter oblique bi-planar fluoroscopic views.  After negative aspirate for air, blood, and CSF, a 2 ml. volume of Isovue-250 was injected into the epidural space and the flow of contrast was observed. Radiographs were obtained for documentation purposes.    The injectate was administered into the level noted above.   Additional Comments:  No complications occurred Dressing: 2 x 2 sterile gauze and Band-Aid    Post-procedure details: Patient was observed during the procedure. Post-procedure instructions were reviewed.  Patient left the clinic in stable condition.

## 2023-10-30 NOTE — Progress Notes (Signed)
 [name] - 78 y.o. male MRN [id number]  Date of birth: 05-11-1946  Office Visit Note: Visit Date: 10/30/2023 PCP: [name], MD Referred by: London Sheer, MD  Subjective: Chief Complaint  Patient presents with   Lower Back - Pain   HPI:  [name] is a 78 y.o. male who comes in today at the request of Dr. Willia Craze for planned Midline T12-L1 Lumbar Interlaminar epidural steroid injection with fluoroscopic guidance.  The patient has failed conservative care including home exercise, medications, time and activity modification.  This injection will be diagnostic and hopefully therapeutic.  Please see requesting physician notes for further details and justification.   ROS Otherwise per HPI.  Assessment & Plan: Visit Diagnoses:    ICD-10-CM   1. Radiculopathy, lumbar region  M54.16 XR C-ARM NO REPORT    Epidural Steroid injection    methylPREDNISolone acetate (DEPO-MEDROL) injection 40 mg      Plan: No additional findings.   Meds & Orders:  Meds ordered this encounter  Medications   methylPREDNISolone acetate (DEPO-MEDROL) injection 40 mg    Orders Placed This Encounter  Procedures   XR C-ARM NO REPORT   Epidural Steroid injection    Follow-up: Return if symptoms worsen or fail to improve.   Procedures: No procedures performed  Lumbar Epidural Steroid Injection - Interlaminar Approach with Fluoroscopic Guidance  Patient: [name]      Date of Birth: [birth year] MRN: [id number] PCP: [name], MD      Visit Date: 10/30/2023   Universal Protocol:     Consent Given By: the patient  Position: PRONE  Additional Comments: Vital signs were monitored before and after the procedure. Patient was prepped and draped in the usual sterile fashion. The correct patient, procedure, and site was verified.   Injection Procedure Details:   Procedure diagnoses: Radiculopathy, lumbar region [M54.16]   Meds Administered:  Meds ordered this  encounter  Medications   methylPREDNISolone acetate (DEPO-MEDROL) injection 40 mg     Laterality: Midline  Location/Site:  T12-L1  Needle: 3.5 in., 20 ga. Tuohy  Needle Placement: Paramedian epidural  Findings:   -Comments: Excellent flow of contrast into the epidural space.  Procedure Details: Using a paramedian approach from the side mentioned above, the region overlying the inferior lamina was localized under fluoroscopic visualization and the soft tissues overlying this structure were infiltrated with 4 ml. of 1% Lidocaine without Epinephrine. The Tuohy needle was inserted into the epidural space using a paramedian approach.   The epidural space was localized using loss of resistance along with counter oblique bi-planar fluoroscopic views.  After negative aspirate for air, blood, and CSF, a 2 ml. volume of Isovue-250 was injected into the epidural space and the flow of contrast was observed. Radiographs were obtained for documentation purposes.    The injectate was administered into the level noted above.   Additional Comments:  No complications occurred Dressing: 2 x 2 sterile gauze and Band-Aid    Post-procedure details: Patient was observed during the procedure. Post-procedure instructions were reviewed.  Patient left the clinic in stable condition.   Clinical History: MRI LUMBAR SPINE WITHOUT CONTRAST   TECHNIQUE: Multiplanar, multisequence MR imaging of the lumbar spine was performed. No intravenous contrast was administered.   COMPARISON:  None Available.   FINDINGS: Segmentation:  Standard.   Alignment:  6 mm retrolisthesis of T12 on L1.   Vertebrae: Postsurgical changes from L1-L5 spinal fusion and decompression. There is  a 9.8 x 2.2 x 3.3 cm fluid collection spanning of the laminectomy site. This could represent a postsurgical seroma or possibly a myelomeningocele. Note that infection can not be reliably excluded in the absence of IV contrast. No  acute fracture.   Conus medullaris and cauda equina: Conus extends to the L2 level. Conus and cauda equina appear normal.   Paraspinal and other soft tissues: Negative.   Disc levels:   Partially imaged thoracic spine is notable for moderate to severe spinal canal stenosis at T10-T11 secondary to a combination of ligamentum flavum hypertrophy and disc bulge. There is also mild spinal canal stenosis and moderate to severe right neural foraminal stenosis at T11-T12.   T12-L1: Junctional level. 6 mm retrolisthesis. Mild bilateral facet degenerative change. Moderate spinal canal stenosis. Moderate bilateral neural foraminal stenosis.   L1-L2: Fused level. Posterior decompression. No spinal canal stenosis. Mild bilateral neural foraminal narrowing.   L2-L3: Fused level. Posterior decompression. No spinal canal stenosis. No neural foraminal stenosis.   L3-L4: Fused level. Posterior decompression. No spinal canal stenosis. No neural foraminal stenosis.   L4-L5: Fused level. Posterior decompression. No spinal canal stenosis. No significant neural foraminal stenosis.   L5-S1: Lower junctional level. No spinal canal stenosis. No neural foraminal stenosis.   IMPRESSION: 1. Postsurgical changes from L1-L5 spinal fusion and decompression. There is a 9.8 x 2.2 x 3.3 cm fluid collection spanning of the laminectomy site. This could represent a postsurgical seroma or possibly a myelomeningocele. Note that infection cannot be reliably excluded in the absence of IV contrast. 2. Junctional level degenerative changes at T12-L1 with moderate spinal canal stenosis and moderate bilateral neural foraminal stenosis. 3. Partially imaged thoracic spine is notable for moderate to severe spinal canal stenosis at T10-T11 secondary to a combination of ligamentum flavum hypertrophy and disc bulge. Recommend further evaluation with a dedicated thoracic spine MRI.     Electronically Signed   By:  Lorenza Cambridge M.D.   On: 09/25/2023 09:57     Objective:  VS:  HT:    WT:   BMI:     BP:(!) 172/77  HR:66bpm  TEMP: ( )  RESP:  Physical Exam Vitals and nursing note reviewed.  Constitutional:      General: He is not in acute distress.    Appearance: Normal appearance. He is not ill-appearing.  HENT:     Head: Normocephalic and atraumatic.     Right Ear: External ear normal.     Left Ear: External ear normal.     Nose: No congestion.  Eyes:     Extraocular Movements: Extraocular movements intact.  Cardiovascular:     Rate and Rhythm: Normal rate.     Pulses: Normal pulses.  Pulmonary:     Effort: Pulmonary effort is normal. No respiratory distress.  Abdominal:     General: There is no distension.     Palpations: Abdomen is soft.  Musculoskeletal:        General: No tenderness or signs of injury.     Cervical back: Neck supple.     Right lower leg: No edema.     Left lower leg: No edema.     Comments: Patient has good distal strength without clonus.  Skin:    Findings: No erythema or rash.  Neurological:     General: No focal deficit present.     Mental Status: He is alert and oriented to person, place, and time.     Sensory: No sensory deficit.  Motor: No weakness or abnormal muscle tone.     Coordination: Coordination normal.  Psychiatric:        Mood and Affect: Mood normal.        Behavior: Behavior normal.      Imaging: No results found.

## 2023-10-30 NOTE — Progress Notes (Signed)
 Pain Score---6 No Blood Thinners No Allergies to Contrast Dye

## 2023-11-06 ENCOUNTER — Ambulatory Visit (HOSPITAL_BASED_OUTPATIENT_CLINIC_OR_DEPARTMENT_OTHER)
Admission: RE | Admit: 2023-11-06 | Discharge: 2023-11-06 | Disposition: A | Discharge status: Home / Self Care | Source: Ambulatory Visit | Attending: Orthopedic Surgery | Admitting: Orthopedic Surgery

## 2023-11-06 DIAGNOSIS — M5416 Radiculopathy, lumbar region: Secondary | ICD-10-CM | POA: Insufficient documentation

## 2023-11-06 DIAGNOSIS — M47814 Spondylosis without myelopathy or radiculopathy, thoracic region: Secondary | ICD-10-CM | POA: Diagnosis not present

## 2023-11-06 DIAGNOSIS — M4804 Spinal stenosis, thoracic region: Secondary | ICD-10-CM | POA: Diagnosis not present

## 2023-11-06 DIAGNOSIS — M5134 Other intervertebral disc degeneration, thoracic region: Secondary | ICD-10-CM | POA: Diagnosis not present

## 2023-11-06 DIAGNOSIS — M5124 Other intervertebral disc displacement, thoracic region: Secondary | ICD-10-CM | POA: Diagnosis not present

## 2023-11-08 ENCOUNTER — Other Ambulatory Visit: Payer: Self-pay | Admitting: Family

## 2023-11-08 DIAGNOSIS — J432 Centrilobular emphysema: Secondary | ICD-10-CM

## 2023-11-10 ENCOUNTER — Other Ambulatory Visit: Payer: Self-pay | Admitting: Family Medicine

## 2023-11-10 NOTE — Telephone Encounter (Signed)
 Requesting: tramadol 50mg  Contract: No UDS: 11/06/21 Last Visit: 01/21/2023 Next Visit: Visit date not found Last Refill: 10/02/23  Please Advise

## 2023-11-10 NOTE — Telephone Encounter (Signed)
 Pt scheduled for 04/15/24 at 120pm.

## 2023-11-11 ENCOUNTER — Ambulatory Visit
Admission: RE | Admit: 2023-11-11 | Discharge: 2023-11-11 | Disposition: A | Discharge status: Home / Self Care | Payer: Medicare HMO | Source: Ambulatory Visit | Attending: Thoracic Surgery (Cardiothoracic Vascular Surgery) | Admitting: Thoracic Surgery (Cardiothoracic Vascular Surgery)

## 2023-11-11 DIAGNOSIS — I700 Atherosclerosis of aorta: Secondary | ICD-10-CM | POA: Diagnosis not present

## 2023-11-11 DIAGNOSIS — R918 Other nonspecific abnormal finding of lung field: Secondary | ICD-10-CM | POA: Diagnosis not present

## 2023-11-11 DIAGNOSIS — Z9889 Other specified postprocedural states: Secondary | ICD-10-CM | POA: Diagnosis not present

## 2023-11-11 DIAGNOSIS — Z122 Encounter for screening for malignant neoplasm of respiratory organs: Secondary | ICD-10-CM

## 2023-11-11 DIAGNOSIS — J439 Emphysema, unspecified: Secondary | ICD-10-CM | POA: Diagnosis not present

## 2023-11-11 DIAGNOSIS — I2510 Atherosclerotic heart disease of native coronary artery without angina pectoris: Secondary | ICD-10-CM | POA: Diagnosis not present

## 2023-11-17 ENCOUNTER — Ambulatory Visit (HOSPITAL_BASED_OUTPATIENT_CLINIC_OR_DEPARTMENT_OTHER)

## 2023-11-17 ENCOUNTER — Ambulatory Visit (INDEPENDENT_AMBULATORY_CARE_PROVIDER_SITE_OTHER): Payer: Medicare HMO | Admitting: Orthopaedic Surgery

## 2023-11-17 DIAGNOSIS — M25512 Pain in left shoulder: Secondary | ICD-10-CM

## 2023-11-17 DIAGNOSIS — G8929 Other chronic pain: Secondary | ICD-10-CM | POA: Diagnosis not present

## 2023-11-17 DIAGNOSIS — Z96612 Presence of left artificial shoulder joint: Secondary | ICD-10-CM | POA: Diagnosis not present

## 2023-11-17 NOTE — Progress Notes (Signed)
 Post Operative Evaluation    Procedure/Date of Surgery: Left shoulder reverse shoulder arthroplasty 12/3  Interval History:   Presents today follow-up of his left reverse shoulder.  Overall he is doing extremely well.  Overhead range of motion is improving significantly   PMH/PSH/Family History/Social History/Meds/Allergies:    Past Medical History:  Diagnosis Date   Allergic rhinitis    Atypical chest pain 12/01/2014   Back pain 01/12/2013   COPD (chronic obstructive pulmonary disease) (HCC)    FeV1 64%-2007   Dyspnea    Dysrhythmia    PVCs   Emphysema    GERD (gastroesophageal reflux disease)    History of lung abscess    bronchiectasis with RMLandRLL ersection -1996- Dr Edwyna Shell   Hordeolum externum (stye) 04/23/2015   Right eye   Hypertension    Impaired vision    glasses   Osteoarthritis    hands and knees   Pneumonia    Sinusitis, acute 04/23/2015   Past Surgical History:  Procedure Laterality Date   COLONOSCOPY  11/20/2010   Dr.Stark   ESOPHAGOGASTRODUODENOSCOPY (EGD) WITH PROPOFOL N/A 10/31/2021   Procedure: ESOPHAGOGASTRODUODENOSCOPY (EGD) WITH PROPOFOL;  Surgeon: Iva Boop, MD;  Location: [facility] ENDOSCOPY;  Service: Gastroenterology;  Laterality: N/A;   HEMOSTASIS CONTROL  10/31/2021   Procedure: HEMOSTASIS CONTROL;  Surgeon: Iva Boop, MD;  Location: [facility] ENDOSCOPY;  Service: Gastroenterology;;   HERNIA REPAIR  10/03/2009   HOT HEMOSTASIS N/A 10/31/2021   Procedure: HOT HEMOSTASIS (ARGON PLASMA COAGULATION/BICAP);  Surgeon: Iva Boop, MD;  Location: [facility] ENDOSCOPY;  Service: Gastroenterology;  Laterality: N/A;   left knee     lower back surgery  09/02/2008   06/2009   LUNG SURGERY     RML andRUL removed due to bleeding and bronchiectasis and lung abcess   NECK SURGERY  05/03/2008   REVERSE SHOULDER ARTHROPLASTY Right 08/22/2022   Procedure: RIGHT REVERSE SHOULDER ARTHROPLASTY;  Surgeon: Huel Cote, MD;   Location: [facility] OR;  Service: Orthopedics;  Laterality: Right;   REVERSE SHOULDER ARTHROPLASTY Left 08/05/2023   Procedure: LEFT REVERSE SHOULDER ARTHROPLASTY;  Surgeon: Huel Cote, MD;  Location: [facility];  Service: Orthopedics;  Laterality: Left;   right foot surgery     SCLEROTHERAPY  10/31/2021   Procedure: SCLEROTHERAPY;  Surgeon: Iva Boop, MD;  Location: [facility] ENDOSCOPY;  Service: Gastroenterology;;   TONSILLECTOMY     removed as a child   VIDEO BRONCHOSCOPY N/A 07/12/2020   Procedure: VIDEO BRONCHOSCOPY;  Surgeon: [name], MD;  Location: [facility] OR;  Service: Thoracic;  Laterality: N/A;   VIDEO BRONCHOSCOPY WITH ENDOBRONCHIAL NAVIGATION N/A 07/12/2020   Procedure: VIDEO BRONCHOSCOPY WITH ENDOBRONCHIAL NAVIGATION;  Surgeon: [name], MD;  Location: [facility] OR;  Service: Thoracic;  Laterality: N/A;   Social History   Socioeconomic History   Marital status: Married    Spouse name: Darcee   Number of children: Not on file   Years of education: Not on file   Highest education level: Bachelor's degree (e.g., BA, AB, BS)  Occupational History   Occupation: retired Librarian, academic  Tobacco Use   Smoking status: Former    Current packs/day: 0.00    Average packs/day: 1.5 packs/day for 38.0 years (57.0 ttl pk-yrs)    Types: Cigarettes    Start date: 09/03/1962  Quit date: 09/03/2000    Years since quitting: 23.2    Passive exposure: Past   Smokeless tobacco: Never  Vaping Use   Vaping status: Never Used  Substance and Sexual Activity   Alcohol use: Yes    Alcohol/week: 1.0 standard drink of alcohol    Types: 1 Cans of beer per week    Comment: 3 or 4 nights weekly   Drug use: No   Sexual activity: Never  Other Topics Concern   Not on file  Social History Narrative   Retired Librarian, academic   Patient states former smoker. 1 1/2 ppd x 38 yrs  Quit in Jan 2002   Married - 2 weeks (4th marriage)   divorced,  remarried 84-2004  (lost wife to lung ca),  remarried (divorced),    1 son  - 20 [name])   Alcohol use-yes (2-3 beers per week)           Social Drivers of Health   Financial Resource Strain: Low Risk  (07/14/2023)   Overall Financial Resource Strain (CARDIA)    Difficulty of Paying Living Expenses: Not hard at all  Food Insecurity: No Food Insecurity (07/14/2023)   Hunger Vital Sign    Worried About Running Out of Food in the Last Year: Never true    Ran Out of Food in the Last Year: Never true  Transportation Needs: No Transportation Needs (07/14/2023)   PRAPARE - Administrator, Civil Service (Medical): No    Lack of Transportation (Non-Medical): No  Physical Activity: Sufficiently Active (07/14/2023)   Exercise Vital Sign    Days of Exercise per Week: 7 days    Minutes of Exercise per Session: 30 min  Stress: No Stress Concern Present (07/14/2023)   [employer] of Occupational Health - Occupational Stress Questionnaire    Feeling of Stress : Only a little  Social Connections: Socially Integrated (07/14/2023)   Social Connection and Isolation Panel [NHANES]    Frequency of Communication with Friends and Family: More than three times a week    Frequency of Social Gatherings with Friends and Family: Once a week    Attends Religious Services: More than 4 times per year    Active Member of [employer] or Organizations: Yes    Attends Engineer, structural: More than 4 times per year    Marital Status: Married   Family History  Problem Relation Age of Onset   Heart failure Mother        age 40   Prostate cancer Father        father died prostate ca   Obstructive Sleep Apnea Brother    Obesity Brother    Colon cancer Paternal Grandmother    Healthy Son    Coronary artery disease Other        1st degree relative<60   Stroke Other        1st degree relative<50   Esophageal cancer Neg Hx    Stomach cancer Neg Hx    Allergies  Allergen Reactions   Nsaids     Do not  give to patient due to hx of stomach bleeds    Losartan Other (See Comments)    weakness   Meloxicam     Caused Stomach Bleeds    Current Outpatient Medications  Medication Sig Dispense Refill   methylPREDNISolone (MEDROL DOSEPAK) 4 MG TBPK tablet Take per packet instructions 1 each 0   acetaminophen (TYLENOL) 500 MG tablet Take 1,000 mg  by mouth. Takes 1000mg  morning and afternoon and 500mg  at bedtime     albuterol (VENTOLIN HFA) 108 (90 Base) MCG/ACT inhaler INHALE 2 PUFFS INTO THE LUNGS EVERY 6 HOURS AS NEEDED FOR WHEEZING OR SHORTNESS OF BREATH. 2 each 10   amLODipine (NORVASC) 5 MG tablet TAKE 1 TABLET EVERY DAY 90 tablet 3   benzonatate (TESSALON) 100 MG capsule TAKE 1 CAPSULE BY MOUTH TWICE DAILY AS NEEDED FOR COUGH 90 capsule 0   calcium elemental as carbonate (BARIATRIC TUMS ULTRA) 400 MG chewable tablet Chew 1,000 mg by mouth daily.     cetirizine (ZYRTEC) 10 MG tablet Take 10 mg by mouth daily.     fluticasone (FLONASE) 50 MCG/ACT nasal spray USE 2 SPRAYS IN EACH NOSTRIL EVERY DAY 48 g 10   Glucosamine-Chondroitin 750-600 MG TABS Take 1 tablet by mouth 2 (two) times daily.     guaiFENesin (MUCINEX) 600 MG 12 hr tablet Take 600 mg by mouth 2 (two) times daily.     loratadine (CLARITIN) 10 MG tablet Take 10 mg by mouth daily.     Menthol, Topical Analgesic, (BLUE-EMU MAXIMUM STRENGTH EX) Apply 1 application  topically daily as needed (arthritis pain).     montelukast (SINGULAIR) 10 MG tablet TAKE 1 TABLET EVERY DAY 90 tablet 3   Multiple Vitamin (MULTIVITAMIN WITH MINERALS) TABS tablet Take 1 tablet by mouth daily.      pantoprazole (PROTONIX) 20 MG tablet Take 1 tablet by mouth once daily 30 tablet 0   traMADol (ULTRAM) 50 MG tablet TAKE 1 TABLET BY MOUTH EVERY 12 HOURS AS NEEDED 60 tablet 0   TRELEGY ELLIPTA 100-62.5-25 MCG/ACT AEPB INHALE 1 PUFF INTO THE LUNGS DAILY 180 each 3   triamcinolone cream (KENALOG) 0.1 % Apply 1 Application topically 2 (two) times daily as needed  (irritation).     No current facility-administered medications for this visit.   No results found.  Review of Systems:   A ROS was performed including pertinent positives and negatives as documented in the HPI.   Musculoskeletal Exam:    There were no vitals taken for this visit.  Right shoulder incision is well-appearing without erythema or drainage.  Range of motion in the supine position is to 90 degrees forward elevation and 20 degrees external rotation.  Internal rotation deferred today.  Distal neurosensory exam is intact.  2+ radial pulse.  Left shoulder incision is well-appearing without erythema or drainage.  Active forward elevation is to approximately 60 degrees with external rotation at side to 30 degrees.  Internal rotation deferred today  Bilateral knees with tricompartmental tenderness and crepitus.  Range of motion bilaterally is from 0 to 125 degrees.  No effusion bilaterally.  Right hip has a positive FADIR with tenderness and internal rotation although this is mild  Imaging:    3 views left shoulder x-ray: Status post left reverse shoulder arthroplasty doing well without complication  X-ray 4 views right knee, 4 views left knee, with 4 views right hip: Mild osteoarthritis right hip with coxa profunda, bilateral moderate severe tricompartmental knee arthritis  I personally reviewed and interpreted the radiographs.   Assessment:   78 year old male doing extremely well status post left reverse shoulder arthroplasty without evidence of complication.  Active range of motion is dramatically improved at today's visit.  He is also working on Print production planner.  This is somewhat taken a backseat due to to issues with his back.  I will plan to see him back in 3 months for reassessment  Plan :    -Return to clinic 3 months for reassessment    I personally saw and evaluated the patient, and participated in the management and treatment plan.  Huel Cote, MD Attending  Physician, Orthopedic Surgery  This document was dictated using Dragon voice recognition software. A reasonable attempt at proof reading has been made to minimize errors.

## 2023-11-18 ENCOUNTER — Other Ambulatory Visit: Payer: Medicare HMO

## 2023-11-18 ENCOUNTER — Encounter: Payer: Self-pay | Admitting: Thoracic Surgery (Cardiothoracic Vascular Surgery)

## 2023-11-18 ENCOUNTER — Ambulatory Visit: Payer: Medicare HMO | Admitting: Thoracic Surgery (Cardiothoracic Vascular Surgery)

## 2023-11-18 VITALS — BP 164/92 | HR 51 | Resp 18 | Ht 68.0 in | Wt 143.0 lb

## 2023-11-18 DIAGNOSIS — R918 Other nonspecific abnormal finding of lung field: Secondary | ICD-10-CM

## 2023-11-18 NOTE — Progress Notes (Signed)
 [address]       [name] [address]             [phone]     HPI: [name] returns for follow-up of a right lung nodule.  [name] is a 78 year old man with a history of tobacco abuse, COPD, right bilobectomy for bronchiectasis and hemoptysis, recurrent hemoptysis, hypertension, GI bleed, reflux, and severe degenerative joint disease.  Dr. Edwyna Shell did about lobectomy for bronchiectasis in 1996.  Presented back in 2021 with recurrent hemoptysis.  Underwent navigational bronchoscopy.  There was no evidence of malignancy.  Grew staph and Acinetobacter from cultures and was treated with antibiotic.  Hemoptysis resolved about 9 months later.  Last saw him in the office in September.  There was a 6 mm lung nodule.  He now returns for 68-month follow-up.  In the interim since his last visit he had a left shoulder operation.  Has not had any hemoptysis.  Did have an episode of bronchitis a couple of weeks ago, still recovering from that.  Past Medical History:  Diagnosis Date   Allergic rhinitis    Atypical chest pain 12/01/2014   Back pain 01/12/2013   COPD (chronic obstructive pulmonary disease) (HCC)    FeV1 64%-2007   Dyspnea    Dysrhythmia    PVCs   Emphysema    GERD (gastroesophageal reflux disease)    History of lung abscess    bronchiectasis with RMLandRLL ersection -1996- Dr Edwyna Shell   Hordeolum externum (stye) 04/23/2015   Right eye   Hypertension    Impaired vision    glasses   Osteoarthritis    hands and knees   Pneumonia    Sinusitis, acute 04/23/2015    Current Outpatient Medications  Medication Sig Dispense Refill   acetaminophen (TYLENOL) 500 MG tablet Take 1,000 mg by mouth. Takes 1000mg  morning and afternoon and 500mg  at bedtime     albuterol (VENTOLIN HFA) 108 (90 Base) MCG/ACT inhaler INHALE 2 PUFFS INTO THE LUNGS EVERY 6 HOURS AS NEEDED FOR WHEEZING OR SHORTNESS OF BREATH. 2 each 10   amLODipine (NORVASC) 5 MG tablet TAKE 1  TABLET EVERY DAY 90 tablet 3   benzonatate (TESSALON) 100 MG capsule TAKE 1 CAPSULE BY MOUTH TWICE DAILY AS NEEDED FOR COUGH 90 capsule 0   calcium elemental as carbonate (BARIATRIC TUMS ULTRA) 400 MG chewable tablet Chew 1,000 mg by mouth daily.     cetirizine (ZYRTEC) 10 MG tablet Take 10 mg by mouth daily.     fluticasone (FLONASE) 50 MCG/ACT nasal spray USE 2 SPRAYS IN EACH NOSTRIL EVERY DAY 48 g 10   Glucosamine-Chondroitin 750-600 MG TABS Take 1 tablet by mouth 2 (two) times daily.     guaiFENesin (MUCINEX) 600 MG 12 hr tablet Take 600 mg by mouth 2 (two) times daily.     loratadine (CLARITIN) 10 MG tablet Take 10 mg by mouth daily.     Menthol, Topical Analgesic, (BLUE-EMU MAXIMUM STRENGTH EX) Apply 1 application  topically daily as needed (arthritis pain).     montelukast (SINGULAIR) 10 MG tablet TAKE 1 TABLET EVERY DAY 90 tablet 3   Multiple Vitamin (MULTIVITAMIN WITH MINERALS) TABS tablet Take 1 tablet by mouth daily.      pantoprazole (PROTONIX) 20 MG tablet Take 1 tablet by mouth once daily 30 tablet 0   traMADol (ULTRAM) 50 MG tablet TAKE 1 TABLET BY MOUTH EVERY 12 HOURS AS NEEDED 60 tablet 0  TRELEGY ELLIPTA 100-62.5-25 MCG/ACT AEPB INHALE 1 PUFF INTO THE LUNGS DAILY 180 each 3   triamcinolone cream (KENALOG) 0.1 % Apply 1 Application topically 2 (two) times daily as needed (irritation).     No current facility-administered medications for this visit.    Physical Exam BP (!) 164/92 (BP Location: Right Arm)   Pulse (!) 51   Resp 18   Ht 5\' 8"  (1.727 m)   Wt 143 lb (64.9 kg)   SpO2 95% Comment: RA  BMI 21.62 kg/m  78 year old man in no acute distress Alert and oriented x 3 with no focal deficits No cervical or supraclavicular adenopathy Lungs rhonchi bilaterally Cardiac slightly irregular rate and rhythm with 2/6 systolic murmur  Diagnostic Tests: CT CHEST WITHOUT CONTRAST   TECHNIQUE: Multidetector CT imaging of the chest was performed following the standard  protocol without IV contrast.   RADIATION DOSE REDUCTION: This exam was performed according to the departmental dose-optimization program which includes automated exposure control, adjustment of the mA and/or kV according to patient size and/or use of iterative reconstruction technique.   COMPARISON:  05/13/2023   FINDINGS: Cardiovascular: Coronary, aortic arch, and branch vessel atherosclerotic vascular disease.   Mediastinum/Nodes: Rightward deviation mediastinal structures due to right hemithoracic volume loss from lobectomy. No pathologic adenopathy observed.   Lungs/Pleura: Prior right middle lobectomy and prior right upper lobectomy. Irregular but stable pleural thickening at the right lung apex. Emphysema. Hazy ground-glass opacity at the right lung base suspicious for alveolitis, increased from previous.   The 6 mm in long axis nodule previously shown on image 47 of series 5 of the 05/13/2023 exam has completely resolved, compatible with previous inflammatory benign etiology.   The 3 mm superior segment left lower lobe nodule shown on 05/13/2023 has also resolved.   The 3 mm right lower lobe nodule on image 66 of series 5 is stable and previously demonstrated a reduction in size compatible with benign etiology.   Upper Abdomen: Abdominal aortic atherosclerosis. Atheromatous plaque eccentric to the left at the origin of the superior mesenteric artery.   Musculoskeletal: Postoperative findings in the lumbar spine with grade 1 degenerative retrolisthesis at T12-L1. Postoperative findings in the lower cervical spine with anterolisthesis at C7-T1. Bilateral total shoulder arthroplasties with atrophic rotator cuff musculature.   IMPRESSION: 1. Hazy ground-glass opacity at the right lung base suspicious for mild alveolitis, increased from previous. 2. No current suspicious pulmonary nodules. The 6 mm in long axis nodule previously shown of the 05/13/2023 exam has  completely resolved, compatible with previous inflammatory benign etiology. 3. Prior right middle lobectomy and prior right upper lobectomy. 4. Emphysema. 5. Coronary, aortic arch, and branch vessel atherosclerotic vascular disease. 6. Postoperative findings in the lumbar spine with grade 1 degenerative retrolisthesis at T12-L1. 7. Postoperative findings in the lower cervical spine with anterolisthesis at C7-T1. 8. Bilateral total shoulder arthroplasties with atrophic rotator cuff musculature.     Electronically Signed   By: Gaylyn Rong M.D.   On: 11/11/2023 14:25 I personally reviewed the CT images.  Postoperative changes on right.  No suspicious lung nodules.  Diffuse groundglass opacity right lower lobe.  Coronary and aortic atherosclerosis.  Impression: [name] is a 78 year old man with a history of tobacco abuse, COPD, right bilobectomy for bronchiectasis and hemoptysis, recurrent hemoptysis, hypertension, GI bleed, reflux, and severe degenerative joint disease.  Lung nodule-6 mm nodule resolved consistent with inflammatory nodule.  A stable 3 mm nodule consistent with benign etiology.  No suspicious nodules.  Needs continued CT screening due to his smoking history.  "Alveolitis"-diffuse groundglass opacity in the lower lobe.  Could be related to his recent bronchitis but does have chronic changes in that area.  And a follow-up with Dr. Cathi Roan regarding that.  Plan: Return in 1 year with low-dose CT chest  I spent over 20 minutes in review of records, images, and in consultation with [name] today. [name], MD [facility] [phone]

## 2023-11-19 ENCOUNTER — Ambulatory Visit: Payer: Medicare HMO | Admitting: Orthopedic Surgery

## 2023-11-19 DIAGNOSIS — M170 Bilateral primary osteoarthritis of knee: Secondary | ICD-10-CM

## 2023-11-19 DIAGNOSIS — M1712 Unilateral primary osteoarthritis, left knee: Secondary | ICD-10-CM

## 2023-11-19 DIAGNOSIS — M1711 Unilateral primary osteoarthritis, right knee: Secondary | ICD-10-CM

## 2023-11-19 NOTE — Progress Notes (Signed)
 Orthopedic Spine Surgery Office Note   Assessment: Patient is a 78 y.o. male with low back pain that radiates into bilateral lower extremities.  Improved significantly with lumbar injection.  Also, comes in today for bilateral knee pain consistent with osteoarthritis     Plan: -Will continue with nonoperative management for his spine since he is doing better -Patient has tried Tylenol, Medrol Dosepak, tramadol, oxycodone, lumbar steroid injection -Since he did well with a lumbar injection, could repeat in the future if symptoms return -Patient has central stenosis at T10/11 but has no symptoms of myelopathy, so we will hold off on any operative management -For his bilateral knee pain, patient has had prior injection and was interested in another.  This was done today in office (see procedure note below) -Patient should return to office on an as-needed basis     Bilateral knee injection note: After discussing the risks, benefits, alternatives of bilateral intra-articular knee injections, patient elected to proceed.  The patient was in the seated position with his knees at 90 degrees.  I started with the left knee.  The anterior lateral soft spot was prepped with an alcohol based prep.  Ethyl chloride was used to anesthetize the area.  A 20-gauge needle was used to inject 1 cc of bupivacaine, 1 cc of lidocaine, and 1 cc of Depo-Medrol under standard sterile technique.  Needle was withdrawn and Band-Aid was applied.  The same process was then repeated to perform a right knee intra-articular injection.  Patient tolerated the procedure well.   ___________________________________________________________________________     History:   Patient is a 78 y.o. male who presents today for follow-up on his lumbar spine.  Patient states that he is doing much better than when he was previously seen in the office.  He said he got his injection with Dr. Alvester Morin and that helped a lot.  He said his pain is 80%  better in his back.  He is no longer having the radiating leg pain.  He is pleased with how the injection went.   He is reporting bilateral knee pain.  He says he notes that when weightbearing.  He has had previous injections in the knee for osteoarthritis.  He is interested in getting repeat injections today with me.  Treatments tried: Tylenol, Medrol Dosepak, tramadol, oxycodone, lumbar steroid injection     Physical Exam:   General: no acute distress, appears stated age Neurologic: alert, answering questions appropriately, following commands Respiratory: unlabored breathing on room air, symmetric chest rise Psychiatric: appropriate affect, normal cadence to speech     MSK (spine):   -Strength exam                                                   Left                  Right EHL                              5/5                  5/5 TA                                 5/5  5/5 GSC                             5/5                  5/5 Knee extension            5/5                  5/5 Hip flexion                    5/5                  5/5   -Sensory exam                           Sensation intact to light touch in L3-S1 nerve distributions of bilateral lower extremities   Imaging: XRs of the lumbar spine from 10/20/2023 were previously independently reviewed and interpreted, showing L1-L5 posterior instrumented spinal fusion.  There appears to be solid posterolateral fusion mass across those levels.  No lucency seen around the screws.  There are interbody devices in all of those disc spaces that appear in appropriate position.  Disc height loss at T10/11, T11/12, T12/L1 seen.  No fracture or dislocation seen.   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  Laminectomy defects from L1-L5.  Posterior instrumentation seen from  L1-L5.  Central stenosis at T12/L1.  At the cranial aspect of the scan on the sagittal's, there appears to be stenosis at the T10/11 level as well.  However, this area is not included on the axial cuts.   MRI of the thoracic spine from 11/06/2023 was independently reviewed and interpreted, showing central stenosis at T10/11.  No other significant central stenosis seen.  Several disc bulges noted in the lower thoracic spine.  Foraminal stenosis on the left at T10/11. Foraminal stenosis on the right at T10/11 and T11/12.   DEX scan from 10/27/2023 showed a T score of -2.0   Patient name: [name] Patient MRN: [id number] Date of visit: 11/19/23

## 2023-11-26 ENCOUNTER — Other Ambulatory Visit: Payer: Self-pay | Admitting: Family Medicine

## 2023-12-05 ENCOUNTER — Other Ambulatory Visit: Payer: Self-pay | Admitting: Family Medicine

## 2023-12-11 ENCOUNTER — Other Ambulatory Visit: Payer: Self-pay | Admitting: Family Medicine

## 2023-12-11 ENCOUNTER — Encounter: Payer: Self-pay | Admitting: *Deleted

## 2023-12-12 ENCOUNTER — Other Ambulatory Visit: Payer: Self-pay | Admitting: Family Medicine

## 2023-12-15 ENCOUNTER — Other Ambulatory Visit: Payer: Self-pay | Admitting: Family Medicine

## 2023-12-16 ENCOUNTER — Encounter: Payer: Self-pay | Admitting: Internal Medicine

## 2023-12-16 ENCOUNTER — Ambulatory Visit: Admitting: Internal Medicine

## 2023-12-16 VITALS — BP 142/70 | HR 89 | Ht 68.0 in | Wt 143.8 lb

## 2023-12-16 DIAGNOSIS — Z87891 Personal history of nicotine dependence: Secondary | ICD-10-CM

## 2023-12-16 DIAGNOSIS — Z902 Acquired absence of lung [part of]: Secondary | ICD-10-CM | POA: Diagnosis not present

## 2023-12-16 DIAGNOSIS — J439 Emphysema, unspecified: Secondary | ICD-10-CM

## 2023-12-16 DIAGNOSIS — R053 Chronic cough: Secondary | ICD-10-CM

## 2023-12-16 DIAGNOSIS — J4489 Other specified chronic obstructive pulmonary disease: Secondary | ICD-10-CM

## 2023-12-16 MED ORDER — IPRATROPIUM BROMIDE 0.06 % NA SOLN: 1.0000 | Freq: Four times a day (QID) | NASAL | 12 refills | Status: AC

## 2023-12-16 NOTE — Progress Notes (Signed)
 [name]    [id number]    07/29/1946  Primary Care Physician:Blyth, Bryon Lions, MD  Referring Physician: [name], MD [address] [place],  [place] [address] Reason for Consultation: chronic cough Date of Consultation: 12/16/2023  Chief complaint:   Chief Complaint  Patient presents with   Consult    Coughing up clear/ yellow/ light brown mucus. Started a couple months ago.     HPI: Discussed the use of AI scribe software for clinical note transcription with the patient, who gave verbal consent to proceed.  History of Present Illness [name] "[name]" is a 78 year old male with a history of COPD and former tobacco use as well as RUL and RML lobectomy in 1996 for focal broncheictasis and recurrent hemoptysis. Seen previously in our office and here to re-establish care. Here for chronic cough.  He has experienced a chronic cough for over 20 years, which has worsened in the last two months. The cough is productive, with sputum that is clear, yellow, or occasionally light brown. It occurs frequently, approximately every five minutes, but does not disturb his sleep. The cough is more pronounced when he is indoors and at rest, rather than outdoors or during exertion.  He has a significant smoking history, having smoked for 38 years before quitting in 2002. He underwent a lobectomy in 1996 due to recurrent bronchiectasis infections and has been diagnosed with COPD. He experiences chronic shortness of breath, chest tightness, and wheezing, which are partially relieved by inhalers. His current medications include Trelegy, albuterol, and over-the-counter cough medicine. He has been using Trelegy for several years, having switched from Pulmicort.  He also uses Flonase, montelukast, Zyrtec, and Claritin for sinus issues, which include congestion and drainage. He has a history of sinus surgery and chronic sinus disease, which he manages with daily nasal sprays  and antihistamines. He has allergies to various environmental factors, including animals and pollen. Ongoing exposure with animals indoor and outdoor.  No history of reflux or heartburn. He has occasional bronchitis but has never had pneumonia. He experiences occasional skipped heartbeats but has not pursued further cardiac evaluation recently.   Social History   Occupational History   Occupation: retired Librarian, academic  Tobacco Use   Smoking status: Former    Current packs/day: 0.00    Average packs/day: 1.5 packs/day for 38.0 years (57.0 ttl pk-yrs)    Types: Cigarettes    Start date: 09/03/1962    Quit date: 09/03/2000    Years since quitting: 23.2    Passive exposure: Past   Smokeless tobacco: Never  Vaping Use   Vaping status: Never Used  Substance and Sexual Activity   Alcohol use: Yes    Alcohol/week: 1.0 standard drink of alcohol    Types: 1 Cans of beer per week    Comment: 3 or 4 nights weekly   Drug use: No   Sexual activity: Never    Relevant family history:  Family History  Problem Relation Age of Onset   Heart failure Mother        age 43   COPD Mother    Prostate cancer Father        father died prostate ca   Obstructive Sleep Apnea Brother    Obesity Brother    Colon cancer Paternal Grandmother    Healthy Son    Coronary artery disease Other  1st degree relative<60   Stroke Other        1st degree relative<50   Esophageal cancer Neg Hx    Stomach cancer Neg Hx     Past Medical History:  Diagnosis Date   Allergic rhinitis    Atypical chest pain 12/01/2014   Back pain 01/12/2013   COPD (chronic obstructive pulmonary disease) (HCC)    FeV1 64%-2007   Dyspnea    Dysrhythmia    PVCs   Emphysema    GERD (gastroesophageal reflux disease)    History of lung abscess    bronchiectasis with RMLandRLL ersection -1996- Dr Percy Bracken   Hordeolum externum (stye) 04/23/2015   Right eye   Hypertension    Impaired vision    glasses    Osteoarthritis    hands and knees   Pneumonia    Sinusitis, acute 04/23/2015    Past Surgical History:  Procedure Laterality Date   COLONOSCOPY  11/20/2010   Dr.Stark   ESOPHAGOGASTRODUODENOSCOPY (EGD) WITH PROPOFOL N/A 10/31/2021   Procedure: ESOPHAGOGASTRODUODENOSCOPY (EGD) WITH PROPOFOL;  Surgeon: Kenney Peacemaker, MD;  Location: [facility] ENDOSCOPY;  Service: Gastroenterology;  Laterality: N/A;   HEMOSTASIS CONTROL  10/31/2021   Procedure: HEMOSTASIS CONTROL;  Surgeon: Kenney Peacemaker, MD;  Location: [facility] ENDOSCOPY;  Service: Gastroenterology;;   HERNIA REPAIR  10/03/2009   HOT HEMOSTASIS N/A 10/31/2021   Procedure: HOT HEMOSTASIS (ARGON PLASMA COAGULATION/BICAP);  Surgeon: Kenney Peacemaker, MD;  Location: [facility] ENDOSCOPY;  Service: Gastroenterology;  Laterality: N/A;   left knee     lower back surgery  09/02/2008   06/2009   LUNG SURGERY     RML andRUL removed due to bleeding and bronchiectasis and lung abcess   NECK SURGERY  05/03/2008   REVERSE SHOULDER ARTHROPLASTY Right 08/22/2022   Procedure: RIGHT REVERSE SHOULDER ARTHROPLASTY;  Surgeon: Wilhelmenia Harada, MD;  Location: [facility] OR;  Service: Orthopedics;  Laterality: Right;   REVERSE SHOULDER ARTHROPLASTY Left 08/05/2023   Procedure: LEFT REVERSE SHOULDER ARTHROPLASTY;  Surgeon: Wilhelmenia Harada, MD;  Location: [facility];  Service: Orthopedics;  Laterality: Left;   right foot surgery     SCLEROTHERAPY  10/31/2021   Procedure: SCLEROTHERAPY;  Surgeon: Kenney Peacemaker, MD;  Location: [facility] ENDOSCOPY;  Service: Gastroenterology;;   TONSILLECTOMY     removed as a child   VIDEO BRONCHOSCOPY N/A 07/12/2020   Procedure: VIDEO BRONCHOSCOPY;  Surgeon: Zelphia Higashi, MD;  Location: [facility] OR;  Service: Thoracic;  Laterality: N/A;   VIDEO BRONCHOSCOPY WITH ENDOBRONCHIAL NAVIGATION N/A 07/12/2020   Procedure: VIDEO BRONCHOSCOPY WITH ENDOBRONCHIAL NAVIGATION;  Surgeon: Zelphia Higashi, MD;  Location: [facility] OR;  Service: Thoracic;   Laterality: N/A;     Physical Exam: Blood pressure (!) 142/70, pulse 89, height 5\' 8"  (1.727 m), weight 143 lb 12.8 oz (65.2 kg), SpO2 96%. Gen:      No acute distress ENT:  mallampati IV, cobblestoning, no nasal polyps, mucus membranes moist Lungs:    No increased respiratory effort, symmetric chest wall excursion, clear to auscultation bilaterally, no wheezes or crackles CV:      irregular - HR in 80s Abd:      + bowel sounds; soft, non-tender; no distension MSK: no acute synovitis of DIP or PIP joints, no mechanics hands.  Skin:      Warm and dry; no rashes Neuro: normal speech, no focal facial asymmetry Psych: alert and oriented x3, normal mood and affect   Data Reviewed/Medical Decision Making:  Independent interpretation of tests:  Imaging:  Review of patient's March 2025 CT CHest images revealed emphysema, s/p RUL and RML lobectomy changes. The patient's images have been independently reviewed by me.    PFTs: I have personally reviewed the patient's PFTs and moderate airflow limitation.    Latest Ref Rng & Units 07/06/2018    8:29 AM  PFT Results  FVC-Pre L 2.74  P  FVC-Predicted Pre % 71  P  FVC-Post L 2.79  P  FVC-Predicted Post % 72  P  Pre FEV1/FVC % % 63  P  Post FEV1/FCV % % 64  P  FEV1-Pre L 1.72  P  FEV1-Predicted Pre % 61  P  FEV1-Post L 1.79  P  DLCO uncorrected ml/min/mmHg 15.04  P  DLCO UNC% % 53  P  DLVA Predicted % 77  P  TLC L 4.86  P  TLC % Predicted % 76  P  RV % Predicted % 85  P    P Preliminary result   EKG Sept 2024 - NSR, frequent PVCs and ventricular trigeminy  Labs:  Lab Results  Component Value Date   NA 138 05/26/2023   K 4.4 05/26/2023   CO2 25 05/26/2023   GLUCOSE 97 05/26/2023   BUN 24 (H) 05/26/2023   CREATININE 0.82 05/26/2023   CALCIUM 9.3 05/26/2023   GFR 85.30 05/26/2023   GFRNONAA >60 08/22/2022   Lab Results  Component Value Date   WBC 6.2 05/26/2023   HGB 14.6 05/26/2023   HCT 45.2 05/26/2023   MCV 95.6  05/26/2023   PLT 250.0 05/26/2023    Immunization status:  Immunization History  Administered Date(s) Administered   Fluad Quad(high Dose 65+) 06/13/2022   Fluad Trivalent(High Dose 65+) 05/23/2023   Influenza Split 06/02/2012   Influenza Whole 05/15/2010, 05/02/2011, 05/18/2013   Influenza, High Dose Seasonal PF 04/20/2017, 05/05/2018, 04/30/2019   Influenza,inj,Quad PF,6+ Mos 05/14/2014, 05/25/2015   Influenza-Unspecified 05/15/2016, 05/23/2021   PFIZER(Purple Top)SARS-COV-2 Vaccination 09/27/2019, 10/18/2019, 05/23/2020   Pfizer Covid-19 Vaccine Bivalent Booster 69yrs & up 05/23/2021   Pfizer(Comirnaty)Fall Seasonal Vaccine 12 years and older 06/13/2022, 05/23/2023   Pneumococcal Conjugate-13 05/20/2013, 05/15/2016   Pneumococcal Polysaccharide-23 07/03/2001, 05/02/2011, 05/20/2018   Td 05/15/2004   Tdap 02/17/2014   Zoster Recombinant(Shingrix) 06/26/2020, 09/20/2020   Zoster, Live 07/16/2011     I reviewed prior external note(s) from pulmonary, ct surgery  I reviewed the result(s) of the labs and imaging as noted above.   I have ordered     Assessment and Plan Assessment & Plan Chronic Cough Chronic cough likely related to sinus issues rather than COPD exacerbation. Current inhaler treatment ineffective, suggesting need for optimized sinus management. - Prescribe ipratropium nasal spray (Atrovent) one spray each nostril up to four times daily. - Instruct on nasal spray technique: tilt chin down, use right hand for left nostril and left hand for right nostril, avoid deep sniffing. - Continue Trelegy, albuterol, montelukast, Zyrtec, Claritin, and Flonase. - Consider ENT or allergy referral if no improvement.  Chronic Obstructive Pulmonary Disease (COPD) Moderate, FEV1 61% of predicted COPD with chronic symptoms managed with Trelegy and albuterol. Symptoms partially controlled due to lung resection and COPD. - Continue Trelegy and albuterol.  Irregular Heart Rhythm -  prior EKGs not NSR with frequent PVCs - patient asymptomatic. Follow up with PCP and/or Cardiology  Lung cancer screening - quit smoking in 2002, outside the window for lung cancer screening   Return to Care: Return in about 6 months (around 06/16/2024).  Jessaca Philippi,  MD Pulmonary and Critical Care Medicine [facility] Office:[phone]  CC: Neda Balk, MD

## 2023-12-16 NOTE — Patient Instructions (Addendum)
 It was a pleasure to see you today!  Please schedule follow up with myself in 6 months.  If my schedule is not open yet, we will contact you with a reminder closer to that time. Please call [phone] if you haven't heard from us  a month before, and always call us  sooner if issues or concerns arise. You can also send us  a message through MyChart, but but aware that this is not to be used for urgent issues and it may take up to 5-7 days to receive a reply. Please be aware that you will likely be able to view your results before I have a chance to respond to them. Please give us  5 business days to respond to any non-urgent results.   -CHRONIC COUGH: Your chronic cough is likely related to your sinus issues rather than a worsening of your COPD. We have prescribed ipratropium nasal spray to help manage your sinus drainage. Please use one spray in each nostril up to four times daily, and follow the nasal spray technique instructions provided. Continue using your current medications: Trelegy, albuterol, montelukast, Zyrtec, Claritin, and Flonase. If there is no improvement, we may refer you to an ENT specialist or allergist.   -CHRONIC OBSTRUCTIVE PULMONARY DISEASE (COPD): Your COPD is being managed with Trelegy and albuterol, which help to control your symptoms. Continue using these medications as prescribed.

## 2023-12-21 NOTE — Assessment & Plan Note (Signed)
 Encourage heart healthy diet such as MIND or DASH diet, increase exercise, avoid trans fats, simple carbohydrates and processed foods, consider a krill or fish or flaxseed oil cap daily.

## 2023-12-21 NOTE — Assessment & Plan Note (Signed)
 [id number] acceptable, minimize simple carbs. Increase exercise as tolerated.

## 2023-12-21 NOTE — Assessment & Plan Note (Signed)
No recent exacerbation, follows with pulmonology

## 2023-12-22 ENCOUNTER — Other Ambulatory Visit: Payer: Self-pay | Admitting: Family Medicine

## 2023-12-22 NOTE — Telephone Encounter (Signed)
 Requesting: tramadol  50mg   Contract: None UDS: None Last Visit: 07/16/23 Next Visit: 12/23/23 Last Refill: 11/10/23 #60 and 0RF   Please Advise

## 2023-12-23 ENCOUNTER — Encounter: Payer: Self-pay | Admitting: Family Medicine

## 2023-12-23 ENCOUNTER — Telehealth (INDEPENDENT_AMBULATORY_CARE_PROVIDER_SITE_OTHER): Admitting: Family Medicine

## 2023-12-23 VITALS — BP 168/86 | HR 92 | Ht 68.0 in | Wt 145.0 lb

## 2023-12-23 DIAGNOSIS — R739 Hyperglycemia, unspecified: Secondary | ICD-10-CM | POA: Diagnosis not present

## 2023-12-23 DIAGNOSIS — J432 Centrilobular emphysema: Secondary | ICD-10-CM | POA: Diagnosis not present

## 2023-12-23 DIAGNOSIS — E785 Hyperlipidemia, unspecified: Secondary | ICD-10-CM

## 2023-12-23 NOTE — Progress Notes (Addendum)
 MyChart Video Visit    Virtual Visit via Video Note   This patient is at least at moderate risk for complications without adequate follow up. This format is felt to be most appropriate for this patient at this time. Physical exam was limited by quality of the video and audio technology used for the visit. Porsha, CMA was able to get the patient set up on a video visit.  Patient location: Home. Patient and provider in visit Provider location: Office  I discussed the limitations of evaluation and management by telemedicine and the availability of in person appointments. The patient expressed understanding and agreed to proceed.  Visit Date: 12/23/2023  Today's healthcare provider: Randie Bustle, MD     Subjective:    Patient ID: [name], male    DOB: 07/31/1946, 78 y.o.   MRN: [id number]  Chief Complaint  Patient presents with   Medical Management of Chronic Issues    Patient presents today for hypertension,hyperlipidemia, edema and emphysema follow-up. His last office visit was on 01/21/23 and no changes made.    HPI Discussed the use of AI scribe software for clinical note transcription with the patient, who gave verbal consent to proceed.  History of Present Illness Ruben KUZNIAR "Al" is a 78 year old male with arthritis and respiratory issues who presents for follow-up on joint pain and respiratory symptoms.  He has ongoing arthritis affecting his knees and back. He received injections in both knees and his back, which provided relief for about three weeks. He anticipates needing another round of injections in a few weeks. He has undergone shoulder surgeries and reports improved mobility and function with the new shoulder replacements.  He has a history of respiratory issues attributed to sinus drainage. Recently, he started using a nasal spray, Astelin (ipratropium), which has significantly improved his symptoms, including a reduction in cough. He continues to use  Flonase , Zyrtec, Claritin, Singulair , Trelegy, and albuterol . Despite these treatments, he experiences episodes of airway obstruction upon waking, which he manages with albuterol  and the nasal spray. He maintains hydration to manage mucus production and reports using plenty of fluids. He experiences these episodes less frequently but finds them concerning.  He has a history of allergies to grass, trees, flowers, and cats, which he manages with his current medication regimen. No recent ER visits, chest pains, or new abdominal issues. He reports stable respiratory symptoms with no significant changes.    Past Medical History:  Diagnosis Date   Allergic rhinitis    Atypical chest pain 12/01/2014   Back pain 01/12/2013   COPD (chronic obstructive pulmonary disease) (HCC)    FeV1 64%-2007   Dyspnea    Dysrhythmia    PVCs   Emphysema    GERD (gastroesophageal reflux disease)    History of lung abscess    bronchiectasis with RMLandRLL ersection -1996- Dr Percy Bracken   Hordeolum externum (stye) 04/23/2015   Right eye   Hypertension    Impaired vision    glasses   Osteoarthritis    hands and knees   Pneumonia    Sinusitis, acute 04/23/2015    Past Surgical History:  Procedure Laterality Date   COLONOSCOPY  11/20/2010   Dr.Stark   ESOPHAGOGASTRODUODENOSCOPY (EGD) WITH PROPOFOL  N/A 10/31/2021   Procedure: ESOPHAGOGASTRODUODENOSCOPY (EGD) WITH PROPOFOL ;  Surgeon: Kenney Peacemaker, MD;  Location: [facility] ENDOSCOPY;  Service: Gastroenterology;  Laterality: N/A;   HEMOSTASIS CONTROL  10/31/2021   Procedure: HEMOSTASIS CONTROL;  Surgeon: Kenney Peacemaker, MD;  Location: [facility] ENDOSCOPY;  Service: Gastroenterology;;   HERNIA REPAIR  10/03/2009   HOT HEMOSTASIS N/A 10/31/2021   Procedure: HOT HEMOSTASIS (ARGON PLASMA COAGULATION/BICAP);  Surgeon: Kenney Peacemaker, MD;  Location: [facility] ENDOSCOPY;  Service: Gastroenterology;  Laterality: N/A;   left knee     lower back surgery  09/02/2008   06/2009   LUNG  SURGERY     RML andRUL removed due to bleeding and bronchiectasis and lung abcess   NECK SURGERY  05/03/2008   REVERSE SHOULDER ARTHROPLASTY Right 08/22/2022   Procedure: RIGHT REVERSE SHOULDER ARTHROPLASTY;  Surgeon: Wilhelmenia Harada, MD;  Location: [facility] OR;  Service: Orthopedics;  Laterality: Right;   REVERSE SHOULDER ARTHROPLASTY Left 08/05/2023   Procedure: LEFT REVERSE SHOULDER ARTHROPLASTY;  Surgeon: Wilhelmenia Harada, MD;  Location:  SURGERY CENTER;  Service: Orthopedics;  Laterality: Left;   right foot surgery     SCLEROTHERAPY  10/31/2021   Procedure: SCLEROTHERAPY;  Surgeon: Kenney Peacemaker, MD;  Location: [facility] ENDOSCOPY;  Service: Gastroenterology;;   TONSILLECTOMY     removed as a child   VIDEO BRONCHOSCOPY N/A 07/12/2020   Procedure: VIDEO BRONCHOSCOPY;  Surgeon: Zelphia Higashi, MD;  Location: [facility] OR;  Service: Thoracic;  Laterality: N/A;   VIDEO BRONCHOSCOPY WITH ENDOBRONCHIAL NAVIGATION N/A 07/12/2020   Procedure: VIDEO BRONCHOSCOPY WITH ENDOBRONCHIAL NAVIGATION;  Surgeon: Zelphia Higashi, MD;  Location: [facility] OR;  Service: Thoracic;  Laterality: N/A;    Family History  Problem Relation Age of Onset   Heart failure Mother        age 57   COPD Mother    Prostate cancer Father        father died prostate ca   Obstructive Sleep Apnea Brother    Obesity Brother    Colon cancer Paternal Grandmother    Healthy Son    Coronary artery disease Other        1st degree relative<60   Stroke Other        1st degree relative<50   Esophageal cancer Neg Hx    Stomach cancer Neg Hx     Social History   Socioeconomic History   Marital status: Married    Spouse name: Darcee   Number of children: Not on file   Years of education: Not on file   Highest education level: Bachelor's degree (e.g., BA, AB, BS)  Occupational History   Occupation: retired Programmer, systems  Tobacco Use   Smoking status: Former    Current packs/day: 0.00    Average packs/day: 1.5  packs/day for 38.0 years (57.0 ttl pk-yrs)    Types: Cigarettes    Start date: 09/03/1962    Quit date: 09/03/2000    Years since quitting: 23.3    Passive exposure: Past   Smokeless tobacco: Never  Vaping Use   Vaping status: Never Used  Substance and Sexual Activity   Alcohol use: Yes    Alcohol/week: 1.0 standard drink of alcohol    Types: 1 Cans of beer per week    Comment: 3 or 4 nights weekly   Drug use: No   Sexual activity: Never  Other Topics Concern   Not on file  Social History Narrative   Retired Solicitor   Patient states former smoker. 1 1/2 ppd x 38 yrs  Quit in Jan 2002   Married - 2 weeks (4th marriage)   divorced,  remarried 84-2004 (lost wife to lung ca),  remarried (divorced),    1 son  -  30 ([place])   Alcohol use-yes (2-3 beers per week)           Social Drivers of Health   Financial Resource Strain: Low Risk  (12/16/2023)   Overall Financial Resource Strain (CARDIA)    Difficulty of Paying Living Expenses: Not hard at all  Food Insecurity: No Food Insecurity (12/16/2023)   Hunger Vital Sign    Worried About Running Out of Food in the Last Year: Never true    Ran Out of Food in the Last Year: Never true  Transportation Needs: No Transportation Needs (12/16/2023)   PRAPARE - Administrator, Civil Service (Medical): No    Lack of Transportation (Non-Medical): No  Physical Activity: Sufficiently Active (12/16/2023)   Exercise Vital Sign    Days of Exercise per Week: 6 days    Minutes of Exercise per Session: 60 min  Stress: No Stress Concern Present (12/16/2023)   [employer] of Occupational Health - Occupational Stress Questionnaire    Feeling of Stress : Not at all  Social Connections: Socially Integrated (12/16/2023)   Social Connection and Isolation Panel [NHANES]    Frequency of Communication with Friends and Family: More than three times a week    Frequency of Social Gatherings with Friends and Family: Once a week     Attends Religious Services: More than 4 times per year    Active Member of [employer] or Organizations: Yes    Attends Engineer, structural: More than 4 times per year    Marital Status: Married  Catering manager Violence: Not At Risk (03/12/2023)   Humiliation, Afraid, Rape, and Kick questionnaire    Fear of Current or Ex-Partner: No    Emotionally Abused: No    Physically Abused: No    Sexually Abused: No    Outpatient Medications Prior to Visit  Medication Sig Dispense Refill   acetaminophen  (TYLENOL ) 500 MG tablet Take 1,000 mg by mouth. Takes 1000mg  morning and afternoon and 500mg  at bedtime     albuterol  (VENTOLIN  HFA) 108 (90 Base) MCG/ACT inhaler INHALE 2 PUFFS INTO THE LUNGS EVERY 6 HOURS AS NEEDED FOR WHEEZING OR SHORTNESS OF BREATH. 2 each 10   amLODipine  (NORVASC ) 5 MG tablet TAKE 1 TABLET EVERY DAY 90 tablet 3   benzonatate  (TESSALON ) 100 MG capsule TAKE 1 CAPSULE BY MOUTH TWICE DAILY AS NEEDED FOR COUGH 90 capsule 0   calcium elemental as carbonate (BARIATRIC TUMS ULTRA) 400 MG chewable tablet Chew 1,000 mg by mouth daily.     cetirizine (ZYRTEC) 10 MG tablet Take 10 mg by mouth daily.     fluticasone  (FLONASE ) 50 MCG/ACT nasal spray USE 2 SPRAYS IN EACH NOSTRIL EVERY DAY 48 g 3   Glucosamine-Chondroitin 750-600 MG TABS Take 1 tablet by mouth 2 (two) times daily.     guaiFENesin  (MUCINEX ) 600 MG 12 hr tablet Take 600 mg by mouth 2 (two) times daily.     ipratropium (ATROVENT ) 0.06 % nasal spray Place 1 spray into both nostrils 4 (four) times daily. 15 mL 12   loratadine (CLARITIN) 10 MG tablet Take 10 mg by mouth daily.     Menthol, Topical Analgesic, (BLUE-EMU MAXIMUM STRENGTH EX) Apply 1 application  topically daily as needed (arthritis pain).     montelukast  (SINGULAIR ) 10 MG tablet TAKE 1 TABLET EVERY DAY 90 tablet 3   Multiple Vitamin (MULTIVITAMIN WITH MINERALS) TABS tablet Take 1 tablet by mouth daily.      pantoprazole  (PROTONIX ) 20 MG tablet  Take 1 tablet by  mouth once daily 30 tablet 0   traMADol  (ULTRAM ) 50 MG tablet TAKE 1 TABLET BY MOUTH EVERY 12 HOURS AS NEEDED 60 tablet 0   TRELEGY ELLIPTA  100-62.5-25 MCG/ACT AEPB INHALE 1 PUFF INTO THE LUNGS DAILY 180 each 3   triamcinolone  cream (KENALOG ) 0.1 % Apply 1 Application topically 2 (two) times daily as needed (irritation).     No facility-administered medications prior to visit.    Allergies  Allergen Reactions   Aspirin      Stomach bleeding    Nsaids     Do not give to patient due to hx of stomach bleeds    Losartan  Other (See Comments)    weakness   Meloxicam      Caused Stomach Bleeds     Review of Systems  Constitutional:  Negative for fever and malaise/fatigue.  HENT:  Positive for congestion.   Eyes:  Negative for blurred vision.  Respiratory:  Negative for shortness of breath.   Cardiovascular:  Negative for chest pain, palpitations and leg swelling.  Gastrointestinal:  Negative for abdominal pain, blood in stool and nausea.  Genitourinary:  Negative for dysuria and frequency.  Musculoskeletal:  Positive for back pain and joint pain. Negative for falls.  Skin:  Negative for rash.  Neurological:  Negative for dizziness, loss of consciousness and headaches.  Endo/Heme/Allergies:  Negative for environmental allergies.  Psychiatric/Behavioral:  Negative for depression. The patient is not nervous/anxious.        Objective:    Physical Exam Constitutional:      General: He is not in acute distress.    Appearance: Normal appearance. He is not ill-appearing or toxic-appearing.  HENT:     Head: Normocephalic and atraumatic.     Right Ear: External ear normal.     Left Ear: External ear normal.     Nose: Nose normal.  Eyes:     General:        Right eye: No discharge.        Left eye: No discharge.  Pulmonary:     Effort: Pulmonary effort is normal.  Skin:    Findings: No rash.  Neurological:     Mental Status: He is alert and oriented to person, place, and time.   Psychiatric:        Behavior: Behavior normal.     BP (!) 168/86   Pulse 92   Ht 5\' 8"  (1.727 m)   Wt 145 lb (65.8 kg)   BMI 22.05 kg/m  Wt Readings from Last 3 Encounters:  12/23/23 145 lb (65.8 kg)  12/16/23 143 lb 12.8 oz (65.2 kg)  11/18/23 143 lb (64.9 kg)       Assessment & Plan:  Centrilobular emphysema (HCC) Assessment & Plan: No recent exacerbation, follows with pulmonology   Hyperglycemia Assessment & Plan: hgba1c acceptable, minimize simple carbs. Increase exercise as tolerated.    Hyperlipidemia, unspecified hyperlipidemia type Assessment & Plan: Encourage heart healthy diet such as MIND or DASH diet, increase exercise, avoid trans fats, simple carbohydrates and processed foods, consider a krill or fish or flaxseed oil cap daily.       Assessment and Plan Assessment & Plan Bilateral Shoulder Pain Managed with reverse shoulder arthroplasty, resulting in improved mobility and quality of life. - Continue shoulder exercises as tolerated.  Knee Osteoarthritis Recent injections provided relief for three weeks. Advised to stay active to prevent symptom worsening. - Continue activity as tolerated to manage symptoms. - Follow up with orthopedic  specialist for repeat injections as needed.  Chronic Obstructive Pulmonary Disease (COPD) Episodes of airway obstruction managed with Trelegy, albuterol , and other medications. Recent improvement with current regimen. - Continue current inhalers and medications. - Consult Dr. Dione Franks if airway obstruction episodes increase. - Consider ENT or allergy referral if no improvement.  Chronic Sinusitis with Postnasal Drip Ipratropium nasal spray has improved symptoms. Continues Flonase , Zyrtec, Claritin, and Singulair . - Continue current nasal sprays and antihistamines.      I discussed the assessment and treatment plan with the patient. The patient was provided an opportunity to ask questions and all were answered. The  patient agreed with the plan and demonstrated an understanding of the instructions.   The patient was advised to call back or seek an in-person evaluation if the symptoms worsen or if the condition fails to improve as anticipated.  Randie Bustle, MD [facility] [phone] (phone) [phone] (fax)  [facility]

## 2024-01-10 ENCOUNTER — Other Ambulatory Visit: Payer: Self-pay | Admitting: Family Medicine

## 2024-01-19 DIAGNOSIS — D3613 Benign neoplasm of peripheral nerves and autonomic nervous system of lower limb, including hip: Secondary | ICD-10-CM | POA: Diagnosis not present

## 2024-01-19 DIAGNOSIS — Z85828 Personal history of other malignant neoplasm of skin: Secondary | ICD-10-CM | POA: Diagnosis not present

## 2024-01-19 DIAGNOSIS — D692 Other nonthrombocytopenic purpura: Secondary | ICD-10-CM | POA: Diagnosis not present

## 2024-01-19 DIAGNOSIS — L218 Other seborrheic dermatitis: Secondary | ICD-10-CM | POA: Diagnosis not present

## 2024-01-19 DIAGNOSIS — D485 Neoplasm of uncertain behavior of skin: Secondary | ICD-10-CM | POA: Diagnosis not present

## 2024-01-19 DIAGNOSIS — L814 Other melanin hyperpigmentation: Secondary | ICD-10-CM | POA: Diagnosis not present

## 2024-01-19 DIAGNOSIS — Z08 Encounter for follow-up examination after completed treatment for malignant neoplasm: Secondary | ICD-10-CM | POA: Diagnosis not present

## 2024-01-19 DIAGNOSIS — Z85820 Personal history of malignant melanoma of skin: Secondary | ICD-10-CM | POA: Diagnosis not present

## 2024-01-19 DIAGNOSIS — L720 Epidermal cyst: Secondary | ICD-10-CM | POA: Diagnosis not present

## 2024-01-19 DIAGNOSIS — D229 Melanocytic nevi, unspecified: Secondary | ICD-10-CM | POA: Diagnosis not present

## 2024-01-23 ENCOUNTER — Other Ambulatory Visit: Payer: Self-pay | Admitting: Family Medicine

## 2024-02-04 IMAGING — CT CT CHEST W/O CM
2 of 5 series · 15 of 36 positions shown, 18 images · non-contrast
Comparison: 05/01/2021

CLINICAL DATA: Follow-up lung nodule. History of right upper and
right middle lobectomies.



[Series 4: chest 2.00 br40 s3 · coronal · 0.63mm/px · 3 of 163 slices shown]
[im 33/163  lung]
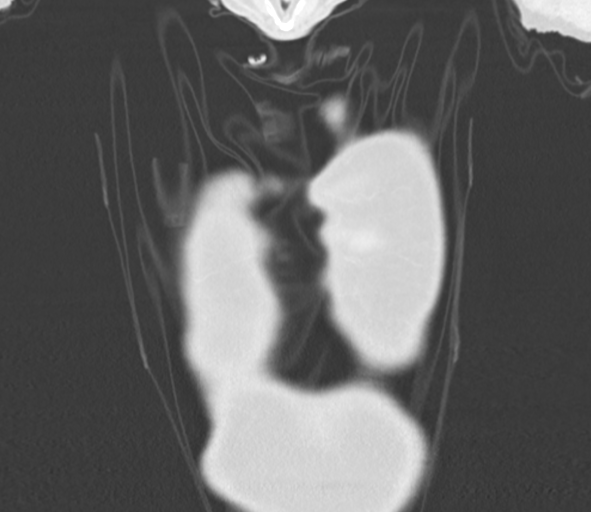
[im 65/163  lung]
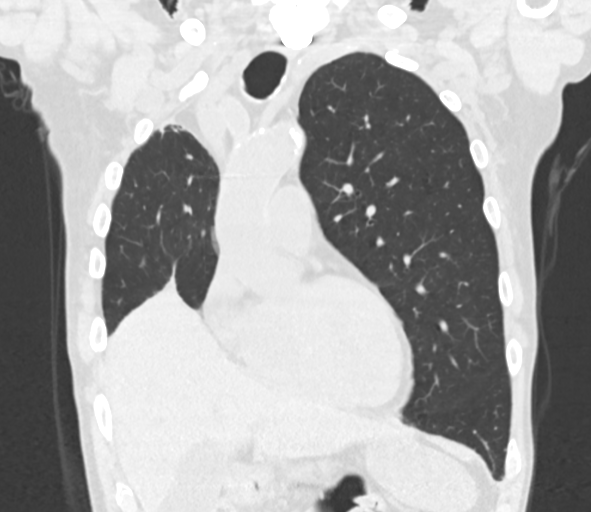
[im 98/163  lung]
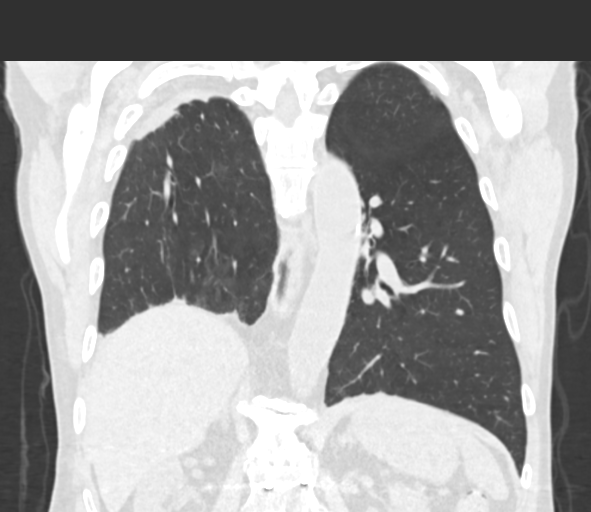

[Series 10: chest 1.00 br40 s3 super d · axial · 0.70mm/px · z∈[+1450,+1729]mm · 12 of 403 slices shown, 15 images]
[im 27/403  mediastinal]
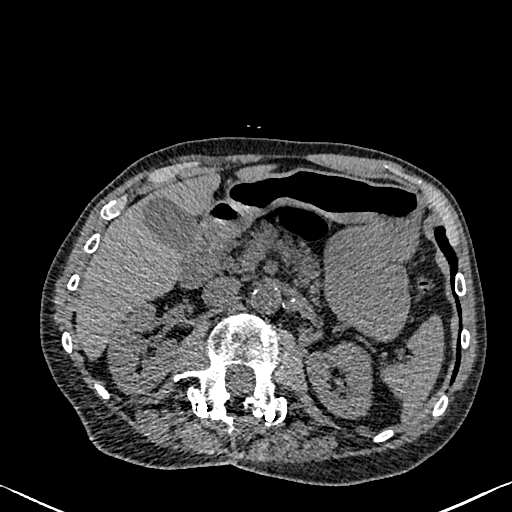
[im 27/403  lung]
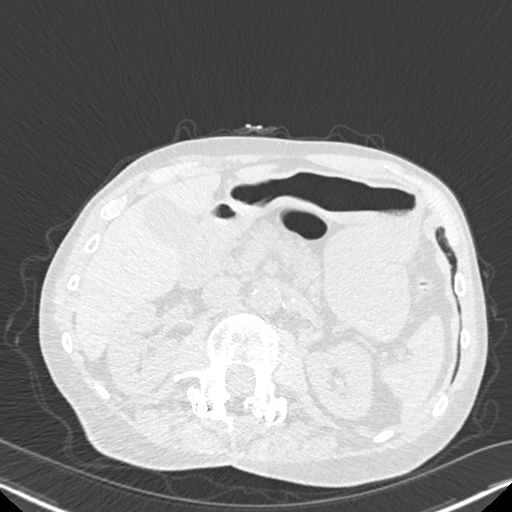
[im 54/403  lung]
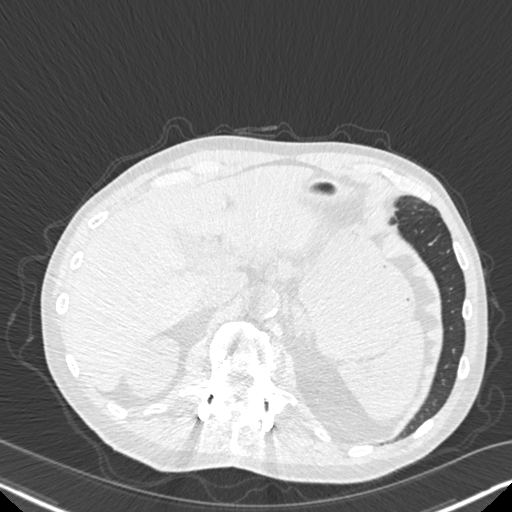
[im 81/403  lung]
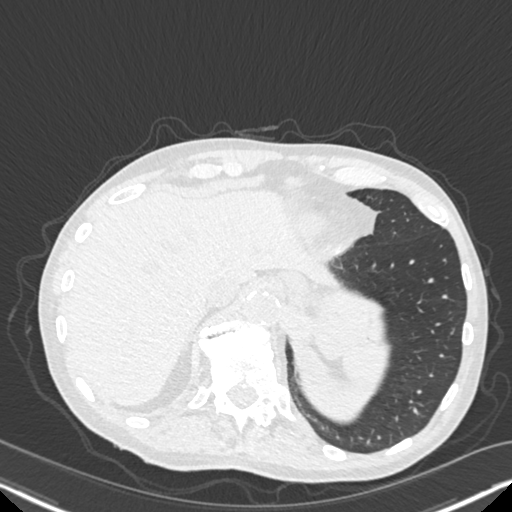
[im 135/403  lung]
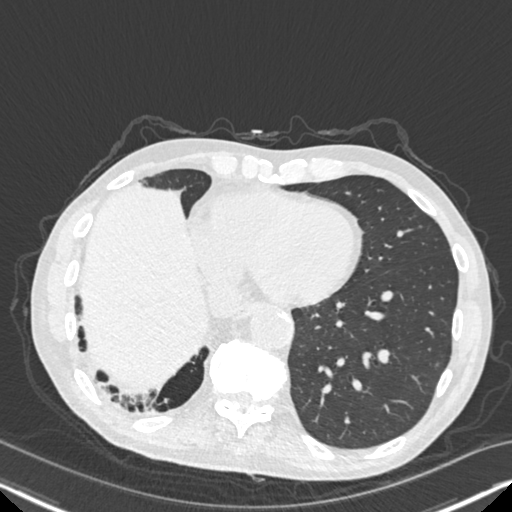
[im 161/403  mediastinal]
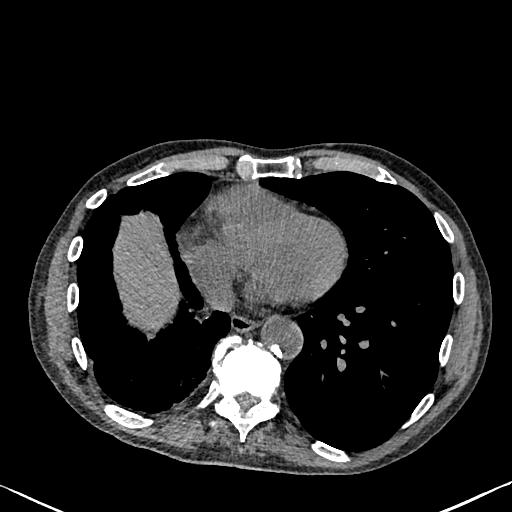
[im 161/403  lung]
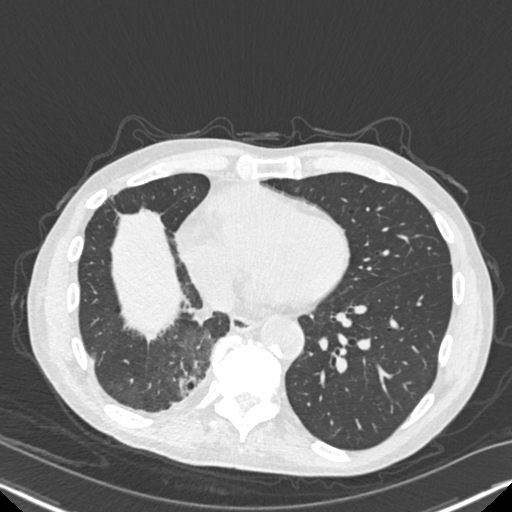
[im 188/403  lung]
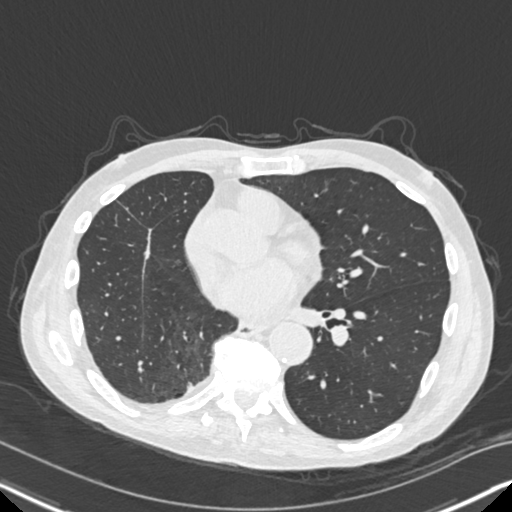
[im 215/403  lung]
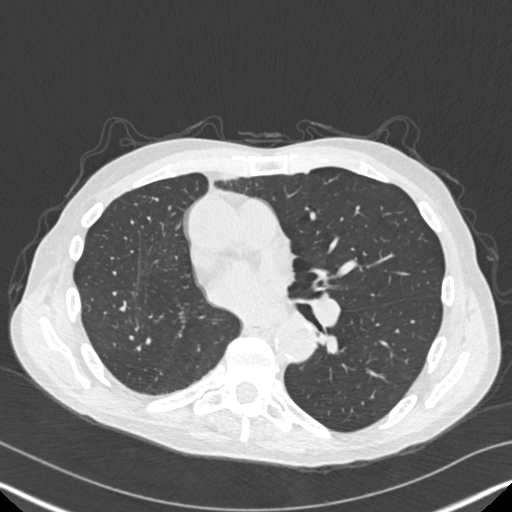
[im 242/403  lung]
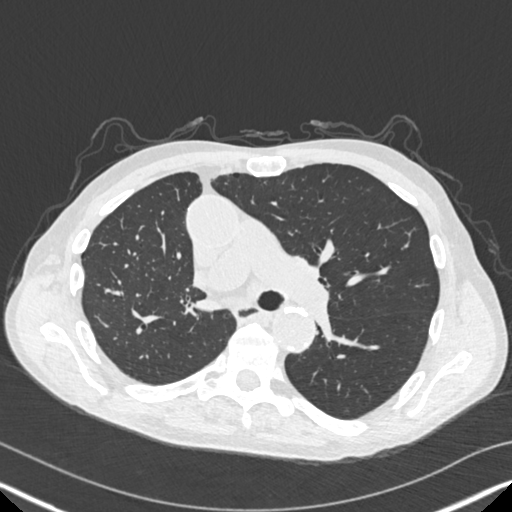
[im 269/403  mediastinal]
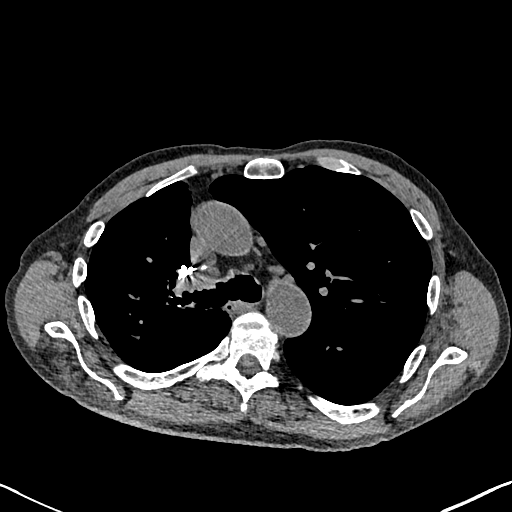
[im 269/403  lung]
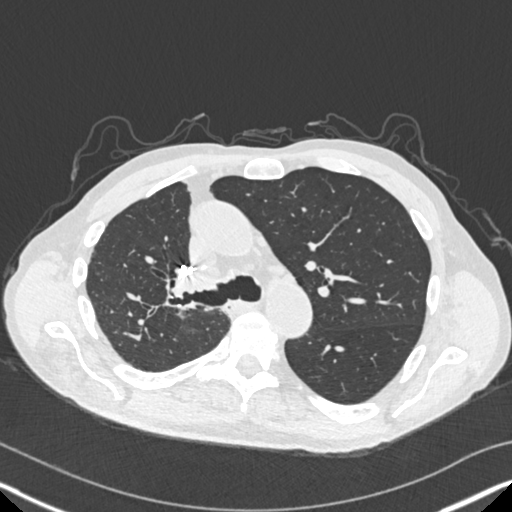
[im 322/403  lung]
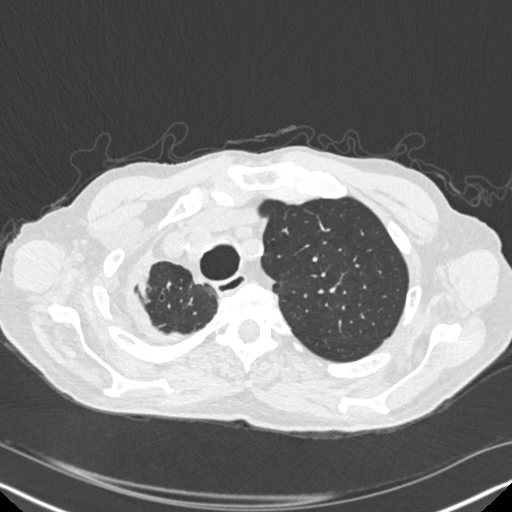
[im 349/403  lung]
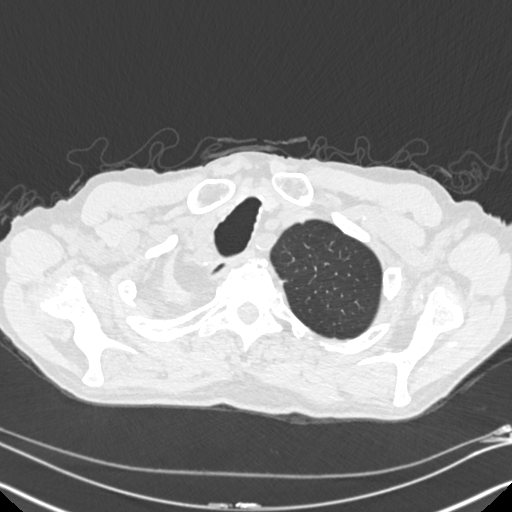
[im 376/403  lung]
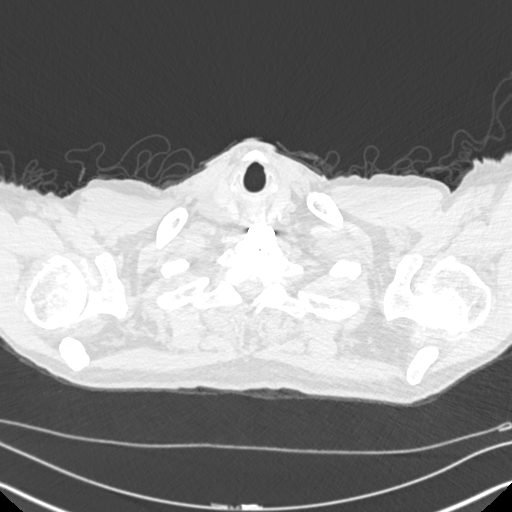

[15 of 36 positions shown; findings below may reference images not displayed]

FINDINGS: Cardiovascular: The heart size appears within normal limits. Aortic
atherosclerosis with coronary artery calcifications. No pericardial
effusion.

Mediastinum/Nodes: No enlarged mediastinal or axillary lymph nodes.
Thyroid gland, trachea, and esophagus demonstrate no significant
findings.

Lungs/Pleura: Centrilobular emphysema.

Status post right upper and right middle lobectomies. Pleural
thickening overlying the apical portion of the right lung is again
noted and appears similar to the previous exam, image 38/2. The
cavitary component of the pleural thickening has resolved when
compared with the previous exam.

Similar appearance of pleuroparenchymal scarring within the
posterior and lateral right lung base. Stable chronic appearing
subpleural collections of gas within the posterior right lung base,
image 98/2. The appearance is unchanged compared with 08/29/2020.

Upper Abdomen: Aortic atherosclerosis. No acute abnormality within
the imaged portions of the upper abdomen.

Musculoskeletal: Postop change from ACDF. Previous PLIF noted within
the lumbar spine. Degenerative disc disease identified within the
lower thoracic spine. No acute or suspicious osseous findings.
IMPRESSION: 1. Status post right upper and right middle lobectomies.
2. Stable appearance of pleural thickening overlying the apical
portion of the right lung. The cavitary component within this area
has resolved in the interval.
3. Stable appearance of pleuroparenchymal scarring within the right
lung base with stable chronic areas of loculated pleural gas
overlying the posterior left base.
4. Aortic Atherosclerosis ([id number]-[id number].W) and Emphysema ([id number].Q).
Coronary artery calcifications.

## 2024-02-04 IMAGING — DX DG CHEST 1V PORT
1 series · 1 of 1 positions shown · non-contrast
Comparison: Right lung radiographs 09/06/2021

CLINICAL DATA: Shortness of breath. Hematemesis. History of right
lung partial resection.

EXAM:
PORTABLE CHEST 1 VIEW

[chest]
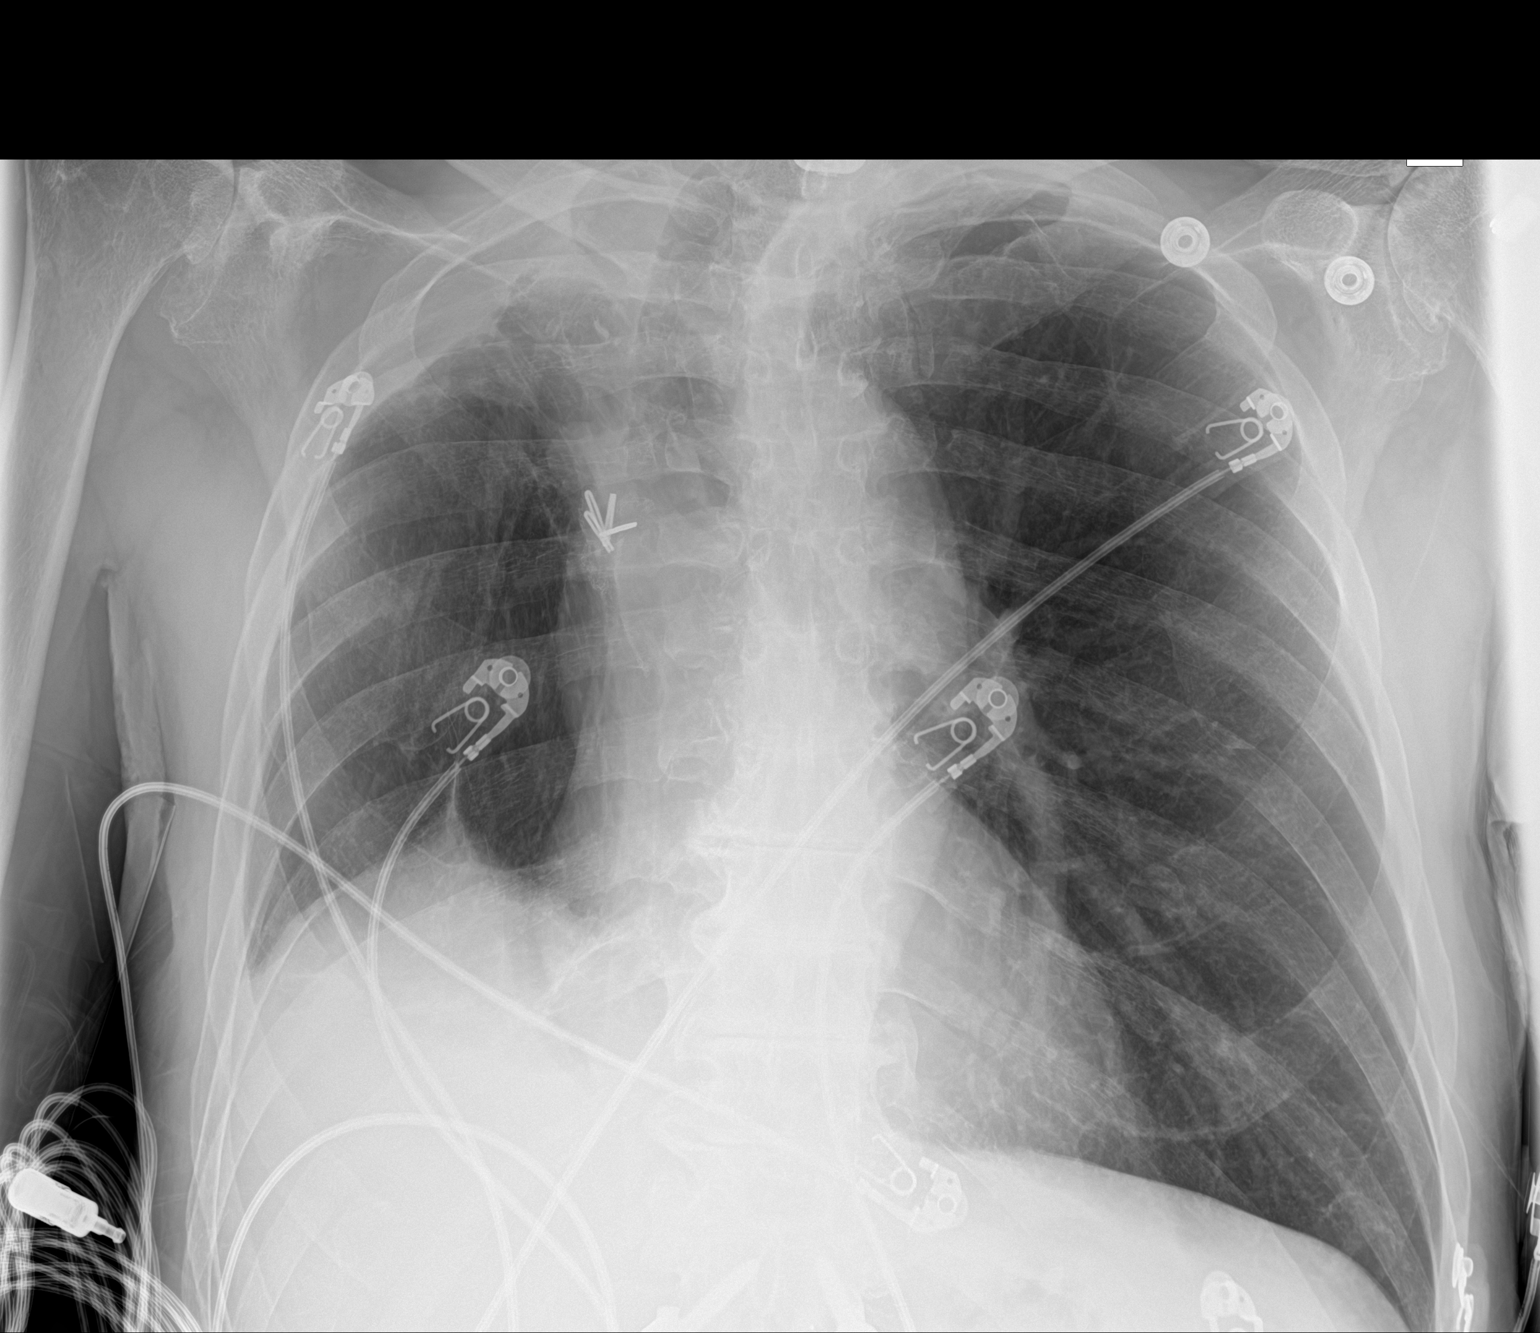

[1 of 1 positions shown; findings below may reference images not displayed]

FINDINGS: Cardiac silhouette and mediastinal contours are unchanged and within
normal limits. There is again moderate right lung volume loss.
Stable right apical thickening. Scarring within the right lung base
unchanged. No new focal airspace opacity. No definite pleural
effusion. No pneumothorax. ACDF hardware again overlies the cervical
spine. High-riding humeral heads bilaterally suggesting
full-thickness superior rotator cuff tears. Severe right greater
than left glenohumeral osteoarthritis.
IMPRESSION: :
IMPRESSION: 1. No significant change in chronic right lung volume loss and
scarring.
2. No acute lung process.

## 2024-02-06 ENCOUNTER — Other Ambulatory Visit: Payer: Self-pay | Admitting: Family

## 2024-02-16 ENCOUNTER — Encounter (HOSPITAL_BASED_OUTPATIENT_CLINIC_OR_DEPARTMENT_OTHER): Admitting: Orthopaedic Surgery

## 2024-02-18 ENCOUNTER — Ambulatory Visit (HOSPITAL_BASED_OUTPATIENT_CLINIC_OR_DEPARTMENT_OTHER)

## 2024-02-18 ENCOUNTER — Ambulatory Visit (HOSPITAL_BASED_OUTPATIENT_CLINIC_OR_DEPARTMENT_OTHER): Admitting: Orthopaedic Surgery

## 2024-02-18 DIAGNOSIS — G8929 Other chronic pain: Secondary | ICD-10-CM

## 2024-02-18 DIAGNOSIS — Z471 Aftercare following joint replacement surgery: Secondary | ICD-10-CM | POA: Diagnosis not present

## 2024-02-18 DIAGNOSIS — M1712 Unilateral primary osteoarthritis, left knee: Secondary | ICD-10-CM | POA: Diagnosis not present

## 2024-02-18 DIAGNOSIS — M1711 Unilateral primary osteoarthritis, right knee: Secondary | ICD-10-CM | POA: Diagnosis not present

## 2024-02-18 DIAGNOSIS — Z96612 Presence of left artificial shoulder joint: Secondary | ICD-10-CM | POA: Diagnosis not present

## 2024-02-18 DIAGNOSIS — M170 Bilateral primary osteoarthritis of knee: Secondary | ICD-10-CM

## 2024-02-18 DIAGNOSIS — M25512 Pain in left shoulder: Secondary | ICD-10-CM

## 2024-02-18 MED ORDER — TRIAMCINOLONE ACETONIDE 40 MG/ML IJ SUSP
80.0000 mg | INTRAMUSCULAR | Status: AC | PRN
  Administered 2024-02-18: 80 mg via INTRA_ARTICULAR

## 2024-02-18 MED ORDER — LIDOCAINE HCL 1 % IJ SOLN
4.0000 mL | INTRAMUSCULAR | Status: AC | PRN
  Administered 2024-02-18: 4 mL

## 2024-02-18 MED ORDER — TRIAMCINOLONE ACETONIDE 40 MG/ML IJ SUSP
80.0000 mg | INTRAMUSCULAR | Status: AC | PRN
Start: 2024-02-18 — End: 2024-02-18
  Administered 2024-02-18: 80 mg via INTRA_ARTICULAR

## 2024-02-18 MED ORDER — LIDOCAINE HCL 1 % IJ SOLN
4.0000 mL | INTRAMUSCULAR | Status: AC | PRN
Start: 2024-02-18 — End: 2024-02-18
  Administered 2024-02-18: 4 mL

## 2024-02-18 NOTE — Progress Notes (Signed)
 Post Operative Evaluation    Procedure/Date of Surgery: Left shoulder reverse shoulder arthroplasty 12/3  Interval History:   Presents today follow-up of his left reverse shoulder.  At this time his range of motion is improving nicely.  He is here today seeking additional knee ultrasound-guided injections   PMH/PSH/Family History/Social History/Meds/Allergies:    Past Medical History:  Diagnosis Date  . Allergic rhinitis   . Atypical chest pain 12/01/2014  . Back pain 01/12/2013  . COPD (chronic obstructive pulmonary disease) (HCC)    FeV1 64%-2007  . Dyspnea   . Dysrhythmia    PVCs  . Emphysema   . GERD (gastroesophageal reflux disease)   . History of lung abscess    bronchiectasis with RMLandRLL ersection -1996- Dr Percy Bracken  . Hordeolum externum (stye) 04/23/2015   Right eye  . Hypertension   . Impaired vision    glasses  . Osteoarthritis    hands and knees  . Pneumonia   . Sinusitis, acute 04/23/2015   Past Surgical History:  Procedure Laterality Date  . COLONOSCOPY  11/20/2010   Dr.Stark  . ESOPHAGOGASTRODUODENOSCOPY (EGD) WITH PROPOFOL  N/A 10/31/2021   Procedure: ESOPHAGOGASTRODUODENOSCOPY (EGD) WITH PROPOFOL ;  Surgeon: Kenney Peacemaker, MD;  Location[facility] ENDOSCOPY;  Service: Gastroenterology;  Laterality: N/A;  . HEMOSTASIS CONTROL  10/31/2021   Procedure: HEMOSTASIS CONTROL;  Surgeon: Kenney Peacemaker, MD;  Location: [facility] ENDOSCOPY;  Service: Gastroenterology;;  . HERNIA REPAIR  10/03/2009  . HOT HEMOSTASIS N/A 10/31/2021   Procedure: HOT HEMOSTASIS (ARGON PLASMA COAGULATION/BICAP);  Surgeon: Kenney Peacemaker, MD;  Location: [facility] ENDOSCOPY;  Service: Gastroenterology;  Laterality: N/A;  . left knee    . lower back surgery  09/02/2008   06/2009  . LUNG SURGERY     RML andRUL removed due to bleeding and bronchiectasis and lung abcess  . NECK SURGERY  05/03/2008  . REVERSE SHOULDER ARTHROPLASTY Right 08/22/2022   Procedure: RIGHT  REVERSE SHOULDER ARTHROPLASTY;  Surgeon: Wilhelmenia Harada, MD;  Location: [facility] OR;  Service: Orthopedics;  Laterality: Right;  . REVERSE SHOULDER ARTHROPLASTY Left 08/05/2023   Procedure: LEFT REVERSE SHOULDER ARTHROPLASTY;  Surgeon: Wilhelmenia Harada, MD;  Location: [facility];  Service: Orthopedics;  Laterality: Left;  . right foot surgery    . SCLEROTHERAPY  10/31/2021   Procedure: SCLEROTHERAPY;  Surgeon: Kenney Peacemaker, MD;  Location: [facility] ENDOSCOPY;  Service: Gastroenterology;;  . TONSILLECTOMY     removed as a child  . VIDEO BRONCHOSCOPY N/A 07/12/2020   Procedure: VIDEO BRONCHOSCOPY;  Surgeon: Zelphia Higashi, MD;  Location: [facility] OR;  Service: Thoracic;  Laterality: N/A;  . VIDEO BRONCHOSCOPY WITH ENDOBRONCHIAL NAVIGATION N/A 07/12/2020   Procedure: VIDEO BRONCHOSCOPY WITH ENDOBRONCHIAL NAVIGATION;  Surgeon: Zelphia Higashi, MD;  Location: [facility] OR;  Service: Thoracic;  Laterality: N/A;   Social History   Socioeconomic History  . Marital status: Married    Spouse name: Darcee  . Number of children: Not on file  . Years of education: Not on file  . Highest education level: Bachelor's degree (e.g., BA, AB, BS)  Occupational History  . Occupation: retired Copy  Tobacco Use  . Smoking status: Former    Current packs/day: 0.00    Average packs/day: 1.5 packs/day for 38.0 years (57.0 ttl pk-yrs)    Types: Cigarettes  Start date: 09/03/1962    Quit date: 09/03/2000    Years since quitting: 23.4    Passive exposure: Past  . Smokeless tobacco: Never  Vaping Use  . Vaping status: Never Used  Substance and Sexual Activity  . Alcohol use: Yes    Alcohol/week: 1.0 standard drink of alcohol    Types: 1 Cans of beer per week    Comment: 3 or 4 nights weekly  . Drug use: No  . Sexual activity: Never  Other Topics Concern  . Not on file  Social History Narrative   Retired Engineer, technical sales   Patient states former smoker. 1 1/2 ppd x 38 yrs  Quit in  Jan 2002   Married - 2 weeks (4th marriage)   divorced,  remarried 84-2004 (lost wife to lung ca),  remarried (divorced),    1 son  - 39 Jil Mosses)   Alcohol use-yes (2-3 beers per week)           Social Drivers of Health   Financial Resource Strain: Low Risk  (12/16/2023)   Overall Financial Resource Strain (CARDIA)   . Difficulty of Paying Living Expenses: Not hard at all  Food Insecurity: No Food Insecurity (12/16/2023)   Hunger Vital Sign   . Worried About Programme researcher, broadcasting/film/video in the Last Year: Never true   . Ran Out of Food in the Last Year: Never true  Transportation Needs: No Transportation Needs (12/16/2023)   PRAPARE - Transportation   . Lack of Transportation (Medical): No   . Lack of Transportation (Non-Medical): No  Physical Activity: Sufficiently Active (12/16/2023)   Exercise Vital Sign   . Days of Exercise per Week: 6 days   . Minutes of Exercise per Session: 60 min  Stress: No Stress Concern Present (12/16/2023)   [employer] of Occupational Health - Occupational Stress Questionnaire   . Feeling of Stress : Not at all  Social Connections: Socially Integrated (12/16/2023)   Social Connection and Isolation Panel   . Frequency of Communication with Friends and Family: More than three times a week   . Frequency of Social Gatherings with Friends and Family: Once a week   . Attends Religious Services: More than 4 times per year   . Active Member of Clubs or Organizations: Yes   . Attends Banker Meetings: More than 4 times per year   . Marital Status: Married   Family History  Problem Relation Age of Onset  . Heart failure Mother        age 29  . COPD Mother   . Prostate cancer Father        father died prostate ca  . Obstructive Sleep Apnea Brother   . Obesity Brother   . Colon cancer Paternal Grandmother   . Healthy Son   . Coronary artery disease Other        1st degree relative<60  . Stroke Other        1st degree relative<50  . Esophageal  cancer Neg Hx   . Stomach cancer Neg Hx    Allergies  Allergen Reactions  . Aspirin      Stomach bleeding   . Nsaids     Do not give to patient due to hx of stomach bleeds   . Losartan  Other (See Comments)    weakness  . Meloxicam      Caused Stomach Bleeds    Current Outpatient Medications  Medication Sig Dispense Refill  . acetaminophen  (TYLENOL ) 500 MG  tablet Take 1,000 mg by mouth. Takes 1000mg  morning and afternoon and 500mg  at bedtime    . albuterol  (VENTOLIN  HFA) 108 (90 Base) MCG/ACT inhaler INHALE 2 PUFFS INTO THE LUNGS EVERY 6 HOURS AS NEEDED FOR WHEEZING OR SHORTNESS OF BREATH. 2 each 10  . amLODipine  (NORVASC ) 5 MG tablet TAKE 1 TABLET EVERY DAY 90 tablet 3  . benzonatate  (TESSALON ) 100 MG capsule TAKE 1 CAPSULE BY MOUTH TWICE DAILY AS NEEDED FOR COUGH 90 capsule 0  . calcium elemental as carbonate (BARIATRIC TUMS ULTRA) 400 MG chewable tablet Chew 1,000 mg by mouth daily.    . cetirizine (ZYRTEC) 10 MG tablet Take 10 mg by mouth daily.    . fluticasone  (FLONASE ) 50 MCG/ACT nasal spray USE 2 SPRAYS IN EACH NOSTRIL EVERY DAY 48 g 3  . Fluticasone -Umeclidin-Vilant (TRELEGY ELLIPTA ) 100-62.5-25 MCG/ACT AEPB INHALE 1 PUFF INTO THE LUNGS DAILY 60 each 0  . Glucosamine-Chondroitin 750-600 MG TABS Take 1 tablet by mouth 2 (two) times daily.    . guaiFENesin  (MUCINEX ) 600 MG 12 hr tablet Take 600 mg by mouth 2 (two) times daily.    Aaron Aas ipratropium (ATROVENT ) 0.06 % nasal spray Place 1 spray into both nostrils 4 (four) times daily. 15 mL 12  . loratadine (CLARITIN) 10 MG tablet Take 10 mg by mouth daily.    . Menthol, Topical Analgesic, (BLUE-EMU MAXIMUM STRENGTH EX) Apply 1 application  topically daily as needed (arthritis pain).    . montelukast  (SINGULAIR ) 10 MG tablet TAKE 1 TABLET EVERY DAY 90 tablet 3  . Multiple Vitamin (MULTIVITAMIN WITH MINERALS) TABS tablet Take 1 tablet by mouth daily.     . pantoprazole  (PROTONIX ) 20 MG tablet Take 1 tablet by mouth once daily 90 tablet 3   . traMADol  (ULTRAM ) 50 MG tablet TAKE 1 TABLET BY MOUTH EVERY 12 HOURS AS NEEDED 60 tablet 0  . triamcinolone  cream (KENALOG ) 0.1 % Apply 1 Application topically 2 (two) times daily as needed (irritation).     No current facility-administered medications for this visit.   No results found.  Review of Systems:   A ROS was performed including pertinent positives and negatives as documented in the HPI.   Musculoskeletal Exam:    There were no vitals taken for this visit.  Right shoulder incision is well-appearing without erythema or drainage.  Range of motion in the supine position is to 90 degrees forward elevation and 20 degrees external rotation.  Internal rotation deferred today.  Distal neurosensory exam is intact.  2+ radial pulse.  Left shoulder incision is well-appearing without erythema or drainage.  Active forward elevation is to approximately 60 degrees with external rotation at side to 30 degrees.  Internal rotation deferred today  Bilateral knees with tricompartmental tenderness and crepitus.  Range of motion bilaterally is from 0 to 125 degrees.  No effusion bilaterally.  Right hip has a positive FADIR with tenderness and internal rotation although this is mild  Imaging:    3 views left shoulder x-ray: Status post left reverse shoulder arthroplasty doing well without complication  X-ray 4 views right knee, 4 views left knee, with 4 views right hip: Mild osteoarthritis right hip with coxa profunda, bilateral moderate severe tricompartmental knee arthritis  I personally reviewed and interpreted the radiographs.   Assessment:   78 year old male doing extremely well status post left reverse shoulder arthroplasty without evidence of complication.  Active range of motion is dramatically improved at today's visit.  He is requesting additional bilateral knee ultrasound-guided  injections.  He would also like a referral to Dr. Lucienne Ryder for discussion of knee arthroplasty as  well Plan :    -Return to clinic as needed    Procedure Note  Patient: [name]             Date of Birth: 06-10-1946           MRN: [id number]             Visit Date: 02/18/2024  Procedures: Visit Diagnoses:  1. Chronic left shoulder pain     Large Joint Inj: R knee on 02/18/2024 12:40 PM Indications: pain Details: 22 G 1.5 in needle, ultrasound-guided anterior approach  Arthrogram: No  Medications: 4 mL lidocaine  1 %; 80 mg triamcinolone  acetonide 40 MG/ML Outcome: tolerated well, no immediate complications Procedure, treatment alternatives, risks and benefits explained, specific risks discussed. Consent was given by the patient. Immediately prior to procedure a time out was called to verify the correct patient, procedure, equipment, support staff and site/side marked as required. Patient was prepped and draped in the usual sterile fashion.    Large Joint Inj: L knee on 02/18/2024 12:41 PM Indications: pain Details: 22 G 1.5 in needle, ultrasound-guided anterior approach  Arthrogram: No  Medications: 4 mL lidocaine  1 %; 80 mg triamcinolone  acetonide 40 MG/ML Outcome: tolerated well, no immediate complications Procedure, treatment alternatives, risks and benefits explained, specific risks discussed. Consent was given by the patient. Immediately prior to procedure a time out was called to verify the correct patient, procedure, equipment, support staff and site/side marked as required. Patient was prepped and draped in the usual sterile fashion.       I personally saw and evaluated the patient, and participated in the management and treatment plan.  Wilhelmenia Harada, MD Attending Physician, Orthopedic Surgery  This document was dictated using Dragon voice recognition software. A reasonable attempt at proof reading has been made to minimize errors.

## 2024-03-01 ENCOUNTER — Other Ambulatory Visit: Payer: Self-pay | Admitting: Family Medicine

## 2024-03-16 ENCOUNTER — Ambulatory Visit: Payer: Medicare HMO | Admitting: Family Medicine

## 2024-03-16 ENCOUNTER — Ambulatory Visit (INDEPENDENT_AMBULATORY_CARE_PROVIDER_SITE_OTHER): Payer: Medicare HMO | Admitting: *Deleted

## 2024-03-16 VITALS — Ht 67.0 in | Wt 140.0 lb

## 2024-03-16 DIAGNOSIS — Z0000 Encounter for general adult medical examination without abnormal findings: Secondary | ICD-10-CM

## 2024-03-16 NOTE — Patient Instructions (Signed)
 Mr. [name] , Thank you for taking time out of your busy schedule to complete your Annual Wellness Visit with me. I enjoyed our conversation and look forward to speaking with you again next year. I, as well as your care team,  appreciate your ongoing commitment to your health goals. Please review the following plan we discussed and let me know if I can assist you in the future. Your Game plan/ To Do List    Follow up Visits: Next Medicare AWV with our clinical staff: 03/17/25 8:20am    Next Office Visit with your provider: 07/01/24 10:20am  Clinician Recommendations:  Aim for 30 minutes of exercise or brisk walking, 6-8 glasses of water , and 5 servings of fruits and vegetables each day.   You will need to get the following vaccines at your local pharmacy: tetanus booster     This is a list of the screening recommended for you and due dates:  Health Maintenance  Topic Date Due   DTaP/Tdap/Td vaccine (3 - Td or Tdap) 02/18/2024   COVID-19 Vaccine (8 - Pfizer risk 2024-25 season) 09/01/2024*   Flu Shot  04/02/2024   Medicare Annual Wellness Visit  03/16/2025   Pneumococcal Vaccine for age over 80  Completed   Hepatitis C Screening  Completed   Zoster (Shingles) Vaccine  Completed   Hepatitis B Vaccine  Aged Out   HPV Vaccine  Aged Out   Meningitis B Vaccine  Aged Out   Colon Cancer Screening  Discontinued  *Topic was postponed. The date shown is not the original due date.    See attachments for Preventive Care and Fall Prevention Tips.

## 2024-03-16 NOTE — Progress Notes (Signed)
 Please attest this visit in the absence of patient primary care provider.    Subjective:   [name] is a 78 y.o. who presents for a Medicare Wellness preventive visit.  As a reminder, Annual Wellness Visits don't include a physical exam, and some assessments may be limited, especially if this visit is performed virtually. We may recommend an in-person follow-up visit with your provider if needed.  Visit Complete: Virtual I connected with  [name] on 03/16/24 by a audio enabled telemedicine application and verified that I am speaking with the correct person using two identifiers.  Patient Location: Home  Provider Location: Office/Clinic  I discussed the limitations of evaluation and management by telemedicine. The patient expressed understanding and agreed to proceed.  Vital Signs: Because this visit was a virtual/telehealth visit, some criteria may be missing or patient reported. Any vitals not documented were not able to be obtained and vitals that have been documented are patient reported.  VideoDeclined- This patient declined Librarian, academic. Therefore the visit was completed with audio only.  Persons Participating in Visit: Patient.  AWV Questionnaire: Yes: Patient Medicare AWV questionnaire was completed by the patient on 03/09/24; I have confirmed that all information answered by patient is correct and no changes since this date.  Cardiac Risk Factors include: advanced age (>12men, >20 women);male gender;hypertension;dyslipidemia;Other (see comment), Risk factor comments: PVCs, Incomplete right bundle branch block, COPD    Objective:    Today's Vitals   03/16/24 0820  Weight: 140 lb (63.5 kg)  Height: 5' 7 (1.702 m)   Body mass index is 21.93 kg/m.     03/16/2024    8:42 AM 08/05/2023    9:47 AM 03/12/2023    8:36 AM 08/22/2022   10:02 AM 07/19/2022   11:21 AM 03/08/2022    1:03 PM 11/01/2021    8:52 AM  Advanced Directives   Does Patient Have a Medical Advance Directive? Yes Yes Yes No No Yes   Type of Estate agent of [place];Living will Living will Healthcare Power of [employer] Power of [place];Living will   Does patient want to make changes to medical advance directive? No - Patient declined No - Patient declined No - Patient declined   No - Patient declined   Copy of Healthcare Power of Attorney in Chart? Yes - validated most recent copy scanned in chart (See row information)  Yes - validated most recent copy scanned in chart (See row information)   Yes - validated most recent copy scanned in chart (See row information)   Would patient like information on creating a medical advance directive?    No - Patient declined No - Patient declined  Yes (Inpatient - patient defers creating a medical advance directive and declines information at this time)    Current Medications (verified) Outpatient Encounter Medications as of 03/16/2024  Medication Sig   acetaminophen  (TYLENOL ) 500 MG tablet Take 1,000 mg by mouth. Takes 1000mg  morning and afternoon and 500mg  at bedtime   albuterol  (VENTOLIN  HFA) 108 (90 Base) MCG/ACT inhaler INHALE 2 PUFFS INTO THE LUNGS EVERY 6 HOURS AS NEEDED FOR WHEEZING OR SHORTNESS OF BREATH.   amLODipine  (NORVASC ) 5 MG tablet TAKE 1 TABLET EVERY DAY   benzonatate  (TESSALON ) 100 MG capsule TAKE 1 CAPSULE BY MOUTH TWICE DAILY AS NEEDED FOR COUGH (Patient not taking: Reported on 03/16/2024)   calcium elemental as carbonate (BARIATRIC TUMS ULTRA) 400 MG chewable tablet Chew 1,000 mg by  mouth daily.   cetirizine (ZYRTEC) 10 MG tablet Take 10 mg by mouth daily.   fluticasone  (FLONASE ) 50 MCG/ACT nasal spray USE 2 SPRAYS IN EACH NOSTRIL EVERY DAY   Glucosamine-Chondroitin 750-600 MG TABS Take 1 tablet by mouth 2 (two) times daily.   guaiFENesin  (MUCINEX ) 600 MG 12 hr tablet Take 600 mg by mouth 2 (two) times daily.   ipratropium (ATROVENT ) 0.06 % nasal spray Place 1 spray  into both nostrils 4 (four) times daily.   loratadine (CLARITIN) 10 MG tablet Take 10 mg by mouth daily.   Menthol, Topical Analgesic, (BLUE-EMU MAXIMUM STRENGTH EX) Apply 1 application  topically daily as needed (arthritis pain).   montelukast  (SINGULAIR ) 10 MG tablet TAKE 1 TABLET EVERY DAY   Multiple Vitamin (MULTIVITAMIN WITH MINERALS) TABS tablet Take 1 tablet by mouth daily.    pantoprazole  (PROTONIX ) 20 MG tablet Take 1 tablet by mouth once daily   traMADol  (ULTRAM ) 50 MG tablet TAKE 1 TABLET BY MOUTH EVERY 12 HOURS AS NEEDED   TRELEGY ELLIPTA  100-62.5-25 MCG/ACT AEPB INHALE 1 PUFF INTO THE LUNGS DAILY   triamcinolone  cream (KENALOG ) 0.1 % Apply 1 Application topically 2 (two) times daily as needed (irritation).   No facility-administered encounter medications on file as of 03/16/2024.    Allergies (verified) Aspirin , Nsaids, Losartan , and Meloxicam    History: Past Medical History:  Diagnosis Date   Allergic rhinitis    Atypical chest pain 12/01/2014   Back pain 01/12/2013   COPD (chronic obstructive pulmonary disease) (HCC)    FeV1 64%-2007   Dyspnea    Dysrhythmia    PVCs   Emphysema    GERD (gastroesophageal reflux disease)    H/O total shoulder replacement, left 2024   H/O total shoulder replacement, right 2023   History of lung abscess    bronchiectasis with RMLandRLL ersection -1996- Dr Brantley   Hordeolum externum (stye) 04/23/2015   Right eye   Hypertension    Impaired vision    glasses   Osteoarthritis    hands and knees   Pneumonia    Sinusitis, acute 04/23/2015   Past Surgical History:  Procedure Laterality Date   COLONOSCOPY  11/20/2010   Dr.Stark   ESOPHAGOGASTRODUODENOSCOPY (EGD) WITH PROPOFOL  N/A 10/31/2021   Procedure: ESOPHAGOGASTRODUODENOSCOPY (EGD) WITH PROPOFOL ;  Surgeon: Avram Lupita BRAVO, MD;  Location: [facility] ENDOSCOPY;  Service: Gastroenterology;  Laterality: N/A;   HEMOSTASIS CONTROL  10/31/2021   Procedure: HEMOSTASIS CONTROL;  Surgeon:  Avram Lupita BRAVO, MD;  Location[facility] ENDOSCOPY;  Service: Gastroenterology;;   HERNIA REPAIR  10/03/2009   HOT HEMOSTASIS N/A 10/31/2021   Procedure: HOT HEMOSTASIS (ARGON PLASMA COAGULATION/BICAP);  Surgeon: Avram Lupita BRAVO, MD;  Location: [facility] ENDOSCOPY;  Service: Gastroenterology;  Laterality: N/A;   left knee     lower back surgery  09/02/2008   06/2009   LUNG SURGERY     RML andRUL removed due to bleeding and bronchiectasis and lung abcess   NECK SURGERY  05/03/2008   REVERSE SHOULDER ARTHROPLASTY Right 08/22/2022   Procedure: RIGHT REVERSE SHOULDER ARTHROPLASTY;  Surgeon: Genelle Standing, MD;  Location: [facility] OR;  Service: Orthopedics;  Laterality: Right;   REVERSE SHOULDER ARTHROPLASTY Left 08/05/2023   Procedure: LEFT REVERSE SHOULDER ARTHROPLASTY;  Surgeon: Genelle Standing, MD;  Location:  [facility];  Service: Orthopedics;  Laterality: Left;   right foot surgery     SCLEROTHERAPY  10/31/2021   Procedure: SCLEROTHERAPY;  Surgeon: Avram Lupita BRAVO, MD;  Location: [facility] ENDOSCOPY;  Service: Gastroenterology;;  TONSILLECTOMY     removed as a child   VIDEO BRONCHOSCOPY N/A 07/12/2020   Procedure: VIDEO BRONCHOSCOPY;  Surgeon: Kerrin Elspeth BROCKS, MD;  Location: [facility] OR;  Service: Thoracic;  Laterality: N/A;   VIDEO BRONCHOSCOPY WITH ENDOBRONCHIAL NAVIGATION N/A 07/12/2020   Procedure: VIDEO BRONCHOSCOPY WITH ENDOBRONCHIAL NAVIGATION;  Surgeon: Kerrin Elspeth BROCKS, MD;  Location: [facility] OR;  Service: Thoracic;  Laterality: N/A;   Family History  Problem Relation Age of Onset   Heart failure Mother        age 37   COPD Mother    Prostate cancer Father        father died prostate ca   Obstructive Sleep Apnea Brother    Obesity Brother    Colon cancer Paternal Grandmother    Healthy Son    Coronary artery disease Other        1st degree relative<60   Stroke Other        1st degree relative<50   Esophageal cancer Neg Hx    Stomach cancer Neg Hx    Social History    Socioeconomic History   Marital status: Married    Spouse name: Darcee   Number of children: Not on file   Years of education: Not on file   Highest education level: Bachelor's degree (e.g., BA, AB, BS)  Occupational History   Occupation: retired Geographical information systems officer  Tobacco Use   Smoking status: Former    Current packs/day: 0.00    Average packs/day: 1.5 packs/day for 38.0 years (57.0 ttl pk-yrs)    Types: Cigarettes    Start date: 09/03/1962    Quit date: 09/03/2000    Years since quitting: 23.5    Passive exposure: Past   Smokeless tobacco: Never  Vaping Use   Vaping status: Never Used  Substance and Sexual Activity   Alcohol use: Yes    Alcohol/week: 1.0 standard drink of alcohol    Types: 1 Cans of beer per week    Comment: 3 or 4 nights weekly   Drug use: No   Sexual activity: Never  Other Topics Concern   Not on file  Social History Narrative   Retired Barista   Patient states former smoker. 1 1/2 ppd x 38 yrs  Quit in Jan 2002   Married - 2 weeks (4th marriage)   divorced,  remarried 84-2004 (lost wife to lung ca),  remarried (divorced),    1 son  - 89 Josh)   Alcohol use-yes (2-3 beers per week)           Social Drivers of Health   Financial Resource Strain: Low Risk  (03/16/2024)   Overall Financial Resource Strain (CARDIA)    Difficulty of Paying Living Expenses: Not very hard  Food Insecurity: No Food Insecurity (03/16/2024)   Hunger Vital Sign    Worried About Running Out of Food in the Last Year: Never true    Ran Out of Food in the Last Year: Never true  Transportation Needs: Unknown (03/16/2024)   PRAPARE - Administrator, Civil Service (Medical): No    Lack of Transportation (Non-Medical): Not on file  Physical Activity: Unknown (03/16/2024)   Exercise Vital Sign    Days of Exercise per Week: 6 days    Minutes of Exercise per Session: Not on file  Stress: No Stress Concern Present (03/16/2024)   [employer] of  Occupational Health - Occupational Stress Questionnaire    Feeling of Stress: Not at  all  Social Connections: Socially Integrated (03/16/2024)   Social Connection and Isolation Panel    Frequency of Communication with Friends and Family: More than three times a week    Frequency of Social Gatherings with Friends and Family: Once a week    Attends Religious Services: More than 4 times per year    Active Member of [employer] or Organizations: Yes    Attends Engineer, structural: More than 4 times per year    Marital Status: Married    Tobacco Counseling Counseling given: Not Answered    Clinical Intake:  Pre-visit preparation completed: Yes  Pain : No/denies pain     BMI - recorded: 21.93 Nutritional Status: BMI of 19-24  Normal Nutritional Risks: None Diabetes: No  Lab Results  Component Value Date   HGBA1C 5.9 05/26/2023   HGBA1C 5.8 01/21/2023   HGBA1C 6.4 06/13/2022     How often do you need to have someone help you when you read instructions, pamphlets, or other written materials from your doctor or pharmacy?: 1 - Never What is the last grade level you completed in school?: Bachelor's degree  Interpreter Needed?: No  Information entered by :: Lolita Libra, CMA   Activities of Daily Living     03/09/2024    7:13 PM 08/05/2023    9:39 AM  In your present state of health, do you have any difficulty performing the following activities:  Hearing? 0 0  Vision? 0 0  Difficulty concentrating or making decisions? 0 0  Walking or climbing stairs? 1   Comment Has arthritis, DDD. Uses handrail and takes his time.   Dressing or bathing? 0   Doing errands, shopping? 0   Preparing Food and eating ? N   Using the Toilet? N   In the past six months, have you accidently leaked urine? N   Do you have problems with loss of bowel control? N   Managing your Medications? N   Managing your Finances? N   Housekeeping or managing your Housekeeping? N     Patient Care  Team: Domenica Harlene LABOR, MD as PCP - General (Family Medicine) Delford Maude BROCKS, MD as PCP - Cardiology (Cardiology) Brien Belvie BRAVO, MD as Attending Physician (Pulmonary Disease) Genelle Standing, MD as Consulting Physician (Orthopedic Surgery) Kerrin Standing BROCKS, MD as Consulting Physician (Cardiothoracic Surgery)  I have updated your Care Teams any recent Medical Services you may have received from other providers in the past year.     Assessment:   This is a routine wellness examination for [name].  Hearing/Vision screen Hearing Screening - Comments:: Denies hearing difficulties.  Vision Screening - Comments:: Pt hasn't had an eye exam in years and said he will find one when he needs it. Declined referral at this time.    Goals Addressed               This Visit's Progress     maintain healthy lifestyle. (pt-stated)   On track      Depression Screen     03/16/2024    8:33 AM 03/12/2023    8:38 AM 01/21/2023    2:16 PM 06/13/2022   10:52 AM 03/08/2022    1:04 PM 02/28/2021    8:24 AM 02/28/2020    9:40 AM  PHQ 2/9 Scores  PHQ - 2 Score 0 0 0 0 0 0 0  PHQ- 9 Score 0 0 0 0       Fall Risk  03/09/2024    7:13 PM 03/06/2023    8:20 AM 01/21/2023    2:16 PM 06/13/2022   10:52 AM 03/14/2022   11:48 AM  Fall Risk   Falls in the past year? 1 1 1 1 1   Number falls in past yr: 0 1 0 0 1  Injury with Fall? 0 0 0 1 1  Risk for fall due to : Impaired mobility History of fall(s);Medication side effect   History of fall(s);Impaired balance/gait;Impaired mobility  Follow up Falls evaluation completed;Education provided Falls evaluation completed;Education provided;Falls prevention discussed Falls evaluation completed Falls evaluation completed  Falls evaluation completed      Data saved with a previous flowsheet row definition    MEDICARE RISK AT HOME:  Medicare Risk at Home Any stairs in or around the home?: (Patient-Rptd) Yes If so, are there any without handrails?:  (Patient-Rptd) No Home free of loose throw rugs in walkways, pet beds, electrical cords, etc?: (Patient-Rptd) Yes Adequate lighting in your home to reduce risk of falls?: (Patient-Rptd) Yes Life alert?: (Patient-Rptd) No Use of a cane, walker or w/c?: (Patient-Rptd) Yes Grab bars in the bathroom?: (Patient-Rptd) Yes Shower chair or bench in shower?: (Patient-Rptd) No Elevated toilet seat or a handicapped toilet?: (Patient-Rptd) No  TIMED UP AND GO:  Was the test performed?  No, audio  Cognitive Function: 6CIT completed    09/10/2016    1:13 PM  MMSE - Mini Mental State Exam  Orientation to time 5   Orientation to Place 5   Registration 3   Attention/ Calculation 5   Recall 3   Language- name 2 objects 2   Language- repeat 1  Language- follow 3 step command 3   Language- read & follow direction 1   Write a sentence 1   Copy design 1   Total score 30      Data saved with a previous flowsheet row definition        03/16/2024    8:33 AM 03/12/2023    8:38 AM 03/08/2022    1:11 PM  6CIT Screen  What Year? 0 points 0 points 0 points  What month? 0 points 0 points 0 points  What time? 0 points 0 points 0 points  Count back from 20 0 points 0 points 0 points  Months in reverse 0 points 0 points 0 points  Repeat phrase 4 points 0 points 0 points  Total Score 4 points 0 points 0 points    Immunizations Immunization History  Administered Date(s) Administered   Fluad  Quad(high Dose 65+) 06/13/2022   Fluad  Trivalent(High Dose 65+) 05/23/2023   Influenza Split 06/02/2012   Influenza Whole 05/15/2010, 05/02/2011, 05/18/2013   Influenza, High Dose Seasonal PF 04/20/2017, 05/05/2018, 04/30/2019   Influenza,inj,Quad PF,6+ Mos 05/14/2014, 05/25/2015   Influenza-Unspecified 05/15/2016, 05/23/2021   PFIZER Comirnaty (Gray Top)Covid-19 Tri-Sucrose Vaccine 12/14/2020   PFIZER(Purple Top)SARS-COV-2 Vaccination 09/27/2019, 10/18/2019, 05/23/2020   Pfizer Covid-19 Vaccine Bivalent Booster  41yrs & up 05/23/2021   Pfizer(Comirnaty )Fall Seasonal Vaccine 12 years and older 06/13/2022, 05/23/2023   Pneumococcal Conjugate-13 05/20/2013, 05/15/2016   Pneumococcal Polysaccharide-23 07/03/2001, 05/02/2011, 05/20/2013, 05/20/2018   Td 05/15/2004   Tdap 02/17/2014   Zoster Recombinant(Shingrix) 06/26/2020, 09/20/2020   Zoster, Live 07/16/2011    Screening Tests Health Maintenance  Topic Date Due   DTaP/Tdap/Td (3 - Td or Tdap) 02/18/2024   Medicare Annual Wellness (AWV)  03/11/2024   COVID-19 Vaccine (8 - Pfizer risk 2024-25 season) 09/01/2024 (Originally 11/20/2023)   INFLUENZA VACCINE  04/02/2024  Pneumococcal Vaccine: 50+ Years  Completed   Hepatitis C Screening  Completed   Zoster Vaccines- Shingrix  Completed   Hepatitis B Vaccines  Aged Out   HPV VACCINES  Aged Out   Meningococcal B Vaccine  Aged Out   Colonoscopy  Discontinued    Health Maintenance  Health Maintenance Due  Topic Date Due   DTaP/Tdap/Td (3 - Td or Tdap) 02/18/2024   Medicare Annual Wellness (AWV)  03/11/2024   Health Maintenance Items Addressed: He will get tetanus booster at pharmacy. All other HM up to date.  Additional Screening:  Vision Screening: Recommended annual ophthalmology exams for early detection of glaucoma and other disorders of the eye. Would you like a referral to an eye doctor? No    Dental Screening: Recommended annual dental exams for proper oral hygiene  Community Resource Referral / Chronic Care Management: CRR required this visit?  No   CCM required this visit?  No   Plan:    I have personally reviewed and noted the following in the patient's chart:   Medical and social history Use of alcohol, tobacco or illicit drugs  Current medications and supplements including opioid prescriptions. Patient is not currently taking opioid prescriptions. Functional ability and status Nutritional status Physical activity Advanced directives List of other  physicians Hospitalizations, surgeries, and ER visits in previous 12 months Vitals Screenings to include cognitive, depression, and falls Referrals and appointments  In addition, I have reviewed and discussed with patient certain preventive protocols, quality metrics, and best practice recommendations. A written personalized care plan for preventive services as well as general preventive health recommendations were provided to patient.   Lolita Libra, CMA   03/16/2024   After Visit Summary: (MyChart) Due to this being a telephonic visit, the after visit summary with patients personalized plan was offered to patient via MyChart   Notes: Nothing significant to report at this time.

## 2024-04-14 ENCOUNTER — Ambulatory Visit: Admitting: Orthopaedic Surgery

## 2024-04-14 ENCOUNTER — Other Ambulatory Visit (INDEPENDENT_AMBULATORY_CARE_PROVIDER_SITE_OTHER): Payer: Self-pay

## 2024-04-14 DIAGNOSIS — G8929 Other chronic pain: Secondary | ICD-10-CM

## 2024-04-14 DIAGNOSIS — M1711 Unilateral primary osteoarthritis, right knee: Secondary | ICD-10-CM

## 2024-04-14 DIAGNOSIS — M25561 Pain in right knee: Secondary | ICD-10-CM

## 2024-04-14 DIAGNOSIS — M170 Bilateral primary osteoarthritis of knee: Secondary | ICD-10-CM

## 2024-04-14 DIAGNOSIS — M25562 Pain in left knee: Secondary | ICD-10-CM | POA: Diagnosis not present

## 2024-04-14 DIAGNOSIS — M1712 Unilateral primary osteoarthritis, left knee: Secondary | ICD-10-CM

## 2024-04-14 NOTE — Progress Notes (Signed)
 The patient is a 78 year old gentleman with severe debilitating arthritis with both knees that has been well-documented.  He does have a remote history of an arthroscopic intervention of his left knee with meniscal tear and a partial meniscectomy.  That was done many years ago.  He does ambulate using a cane.  He is very thin individual.  He does have a history of a resection of a lobe of one of his lungs.  He is on chronic medications for his lungs.  He does see a pulmonologist regularly.  My partner Dr. Genelle sent him our way.  He has had surgery on both shoulders by my partner under general anesthesia.  He does report daily right and left knee pain that is detrimentally that he is mobility, his quality life and his actives daily living.  The pain is 10 out of 10.  He has tried and failed conservative treatment with both knees for over a year now.  Examination of both knees today show varus malalignment of the left knee and valgus malalignment of the right knee.  Both knees have a mild effusion with pain throughout the arc of motion of the knees.  There is crepitation with both knees at the patellofemoral joint.  There is global tenderness with both knees.  Both knees are ligamentously stable.   X-rays today of manage the right knee which hurts on the most she has valgus malalignment with bone-on-bone wear of all 3 compartments and osteophytes.  The AP view also shows the left knee has varus malalignment with bone-on-bone wear.  I did show him a knee replacement model and went over in length in detail what knee replacement surgery involves.  He has had extensive spine surgery as well with both cervical and lumbar spine.  I discussed the risks and benefits of surgery and what to expect from an intraoperative and postoperative standpoint.  I went over his x-rays with him as well.  All questions and concerns were answered and addressed.  He would like us  to proceed with a right total knee replacement in the  near future.

## 2024-04-15 ENCOUNTER — Ambulatory Visit: Admitting: Family Medicine

## 2024-04-20 ENCOUNTER — Ambulatory Visit: Payer: Self-pay

## 2024-04-20 DIAGNOSIS — R079 Chest pain, unspecified: Secondary | ICD-10-CM | POA: Diagnosis not present

## 2024-04-20 DIAGNOSIS — R0789 Other chest pain: Secondary | ICD-10-CM | POA: Diagnosis not present

## 2024-04-20 DIAGNOSIS — I491 Atrial premature depolarization: Secondary | ICD-10-CM | POA: Diagnosis not present

## 2024-04-20 DIAGNOSIS — R7989 Other specified abnormal findings of blood chemistry: Secondary | ICD-10-CM | POA: Diagnosis not present

## 2024-04-20 DIAGNOSIS — I4510 Unspecified right bundle-branch block: Secondary | ICD-10-CM | POA: Diagnosis not present

## 2024-04-20 DIAGNOSIS — I493 Ventricular premature depolarization: Secondary | ICD-10-CM | POA: Diagnosis not present

## 2024-04-20 DIAGNOSIS — I440 Atrioventricular block, first degree: Secondary | ICD-10-CM | POA: Diagnosis not present

## 2024-04-20 DIAGNOSIS — I517 Cardiomegaly: Secondary | ICD-10-CM | POA: Diagnosis not present

## 2024-04-20 NOTE — Telephone Encounter (Signed)
FYI. Pt going to ED.  

## 2024-04-20 NOTE — ED Notes (Signed)
 Pt to restroom and back to room without incident.    Harlene Alken, RN 04/20/24 [phone]

## 2024-04-20 NOTE — ED Provider Notes (Signed)
 [facility] Emergency Medicine Care Note       Chief Complaint  Patient presents with  . Chest Pain  . Leg Swelling    History   78 year old male with a history of arthritis presenting with chest pain and shortness of breath.  He states that for the past 2 or 3 days he has had worsening leg swelling but today he started noticing chest pain.  He had 2 separate episodes of chest pain that was sharp and nonradiating that lasted 8 to 10 minutes.  1 was during rest and 1 was during exertion.  He had complete resolution of pain in between these episodes and he denies any pain at this time other than stating that something does not feel right.  Denies nausea or vomiting.  Denies abdominal pain.  No recent falls or trauma.  Has not tried any thing for symptoms.    Medical History[1] Surgical History[2] Family History[3] Social History[4]    Physical Exam   Physical Exam ED Triage Vitals [04/20/24 1549]  Temp 98.4 F (36.9 C)  Heart Rate 54  Resp 18  BP (!) 199/86  MAP (mmHg) 124  SpO2 94 %  O2 Device None (Room air)  O2 Flow Rate (L/min)   Weight 67.5 kg (148 lb 12.8 oz)   Physical Exam Vitals and nursing note reviewed.  Constitutional:      General: He is not in acute distress.    Appearance: He is not toxic-appearing.  HENT:     Head: Normocephalic and atraumatic.     Mouth/Throat:     Mouth: Mucous membranes are moist.   Eyes:     Conjunctiva/sclera: Conjunctivae normal.    Cardiovascular:     Rate and Rhythm: Normal rate. Rhythm irregular.     Pulses: Normal pulses.     Heart sounds: No murmur heard.    No friction rub. No gallop.  Pulmonary:     Effort: Pulmonary effort is normal.     Breath sounds: No wheezing, rhonchi or rales.  Abdominal:     General: There is no distension.     Palpations: Abdomen is soft.     Tenderness: There is no abdominal tenderness.   Musculoskeletal:        General: Normal range of motion.      Cervical back: Normal range of motion and neck supple.     Right lower leg: No tenderness. Edema present.     Left lower leg: No tenderness. Edema present.   Skin:    General: Skin is warm.     Capillary Refill: Capillary refill takes less than 2 seconds.   Neurological:     General: No focal deficit present.     Mental Status: He is alert and oriented to person, place, and time.        Scoring Tools Utilized    CHA2DS2-VASc Score: N/A  Glasgow Coma Scale Score: 15                     Procedures Performed   Procedures                        Medical Decision Making Problems Addressed: Chest pain, unspecified type: complicated acute illness or injury that poses a threat to life or bodily functions  Amount and/or Complexity of Data Reviewed Labs: ordered. Radiology: ordered. ECG/medicine tests: ordered.  Risk Prescription drug management.    Differential  Diagnosis   ACS, PE, dissection, pneumothorax, pneumonia, arrhythmia  Laboratory Studies Ordered and Interpreted   No leukocytosis or anemia, 0 and 2-hour troponin negative, BNP 163.  D-dimer elevated to 2000.  Electrolytes overall reassuring.  Normal renal liver function.  Radiology Studies Ordered and Personally Interpreted   X-ray with no focal [place], pneumothorax or wide mediastinum.  CTA of the chest shows no evidence of PE, dissection, pneumonia, pneumothorax or pulmonary edema.  Cardiac Monitor Interpretation   The Patient was maintained on a cardiac monitor.  I personally viewed and interpreted the cardiac monitor which showed an underlying rhythm of sinus rhythm with frequent PVCs with a rate of 95   ECG Interpretation   ECG was personally viewed and interpreted      NS rhythm with a rate of 104, first-degree AV block with frequent PVCs      No acute ischemic ST-T wave changes.       No acute changes suggestive of hyperkalemia.      No WPW, LQTS, or Brugada's Syndrome. No QRS widening.        No prior EKG  ED Course and Medical Decision Making   Patient with chest pain and shortness of breath earlier today but chest pain has since resolved.  He is afebrile and hemodynamically stable.  Found to have an irregular heart rate and on EKG it is sinus but has frequent PVCs.  Patient states that he knows about his PVCs and he has had them most of his life and he states that he has not had any symptoms from it so his doctor is watching it.  No obvious signs of ischemia.  0 and 2-hour troponin negative so doubt ACS.  His D-dimer was positive so he did have a CT PE study performed which showed no evidence of PE.  Also no evidence of pneumonia pneumothorax or dissection.  No signs of pulmonary edema.  His BNP was slightly elevated at 163 which could be contributing to his lower extremity swelling but with no pulmonary edema, hypoxia or other signs of fluid overload I feel that he is safe for discharge with PCP follow-up.  He has been ambulating back and forth to the bathroom without any difficulty chest pain or shortness of breath.  He is otherwise nontoxic.  I feel that he is safe for discharge home.  He was given strict return precautions.  He is safe for discharge home.   Decision Regarding Hospitalization   Considered hospitalization, however, evaluation and diagnostic testing in the emergency department does not suggest an emergent condition requiring admission or immediate intervention beyond what has been performed at this time.  The patient is safe for discharge and has been instructed to return immediately for worsening symptoms, change in symptoms or any other concerns.   Medications Delivered During ED Care   Medications  iohexoL  (OMNIPAQUE ) 350 mg iodine /mL injection (SDV) 79 mL (79 mL intravenous Given 04/20/24 1818)    ED Disposition   Discharge  Clinical Impression:  1. Chest pain, unspecified type Acute     Rankin Oman, MD Emergency Medicine [facility]  [facility]       [1] Past Medical History: Diagnosis Date  . Arthritis   . Lung disease   [2] Past Surgical History: Procedure Laterality Date  . BACK SURGERY     Procedure: BACK SURGERY  . CERVICAL SPINE SURGERY     Procedure: CERVICAL SPINE SURGERY  . FOOT GANGLION EXCISION  Procedure: GANGLION CYST EXCISION OF FOOT  . HERNIA REPAIR     Procedure: HERNIA REPAIR  . KNEE CARTILAGE SURGERY     Procedure: KNEE CARTILAGE SURGERY  . LUNG LOBECTOMY Right    Procedure: LUNG LOBECTOMY  . SINUS SURGERY     Procedure: SINUS SURGERY  [3] Family History Problem Relation Name Age of Onset  . Prostate cancer Father    . Prostate cancer Brother    . Prostate cancer Paternal Uncle    . Cancer Paternal Grandmother    [4] Social History Tobacco Use  . Smoking status: Former    Current packs/day: 0.00    Types: Cigarettes    Quit date: 09/02/2000    Years since quitting: 23.6  . Smokeless tobacco: Never  Substance Use Topics  . Alcohol use: Yes  . Drug use: Never

## 2024-04-20 NOTE — Telephone Encounter (Signed)
 FYI Only or Action Required?: FYI only for provider.  Patient was last seen in primary care on 12/23/2023 by Domenica Harlene LABOR, MD.  Called Nurse Triage reporting Leg Swelling and Chest Pain.  Symptoms began several days ago.  Interventions attempted: Nothing.  Symptoms are: gradually worsening.  Triage Disposition: Go to ED Now (or PCP Triage)  Patient/caregiver understands and will follow disposition?: Yes                   Copied from CRM [id number]. Topic: Clinical - Red Word Triage >> Apr 20, 2024  2:38 PM Drema MATSU wrote: Red Word that prompted transfer to Nurse Triage: Patient has swelling in feet and ankles. It has been this way for 2-3 weeks.   ----------------------------------------------------------------------- From previous Reason for Contact - Scheduling: Patient/patient representative is calling to schedule an appointment. Refer to attachments for appointment information. Reason for Disposition  [1] Chest pain lasts > 5 minutes AND [2] occurred in past 3 days (72 hours) (Exception: Feels exactly the same as previously diagnosed heartburn and has accompanying sour taste in mouth.)  Answer Assessment - Initial Assessment Questions 1. ONSET: When did the swelling start? (e.g., minutes, hours, days)     3 weeks off and on 2. LOCATION: What part of the leg is swollen?  Are both legs swollen or just one leg?     Both feet and ankles 3. SEVERITY: How bad is the swelling? (e.g., localized; mild, moderate, severe)     Mild - moderate - difficult to get shoe on left foot. 4. REDNESS: Is there redness or signs of infection?     No  5. PAIN: Is the swelling painful to touch? If Yes, ask: How painful is it?   (Scale 1-10; mild, moderate or severe)     no 6. FEVER: Do you have a fever? If Yes, ask: What is it, how was it measured, and when did it start?      no 7. CAUSE: What do you think is causing the leg swelling?     Unknown 9.  RECURRENT SYMPTOM: Have you had leg swelling before? If Yes, ask: When was the last time? What happened that time?     no 10. OTHER SYMPTOMS: Do you have any other symptoms? (e.g., chest pain, difficulty breathing)       COPD, Chest pain  Answer Assessment - Initial Assessment Questions 1. LOCATION: Where does it hurt?       Chest left of center 2. RADIATION: Does the pain go anywhere else? (e.g., into neck, jaw, arms, back)      3. ONSET: When did the chest pain begin? (Minutes, hours or days)      A couple of days 4. PATTERN: Does the pain come and go, or has it been constant since it started?  Does it get worse with exertion?      Comes and goes 5. DURATION: How long does it last (e.g., seconds, minutes, hours)     5-10 minutes 6. SEVERITY: How bad is the pain?  (e.g., Scale 1-10; mild, moderate, or severe)     mild 7. CARDIAC RISK FACTORS: Do you have any history of heart problems or risk factors for heart disease? (e.g., angina, prior heart attack; diabetes, high blood pressure, high cholesterol, smoker, or strong family history of heart disease)     no 8. PULMONARY RISK FACTORS: Do you have any history of lung disease?  (e.g., blood clots in lung, asthma, emphysema, birth control  pills)     copd  Protocols used: Leg Swelling and Edema-A-AH, Chest Pain-A-AH

## 2024-04-20 NOTE — ED Triage Notes (Signed)
 Ambualtory to triage with c/o BLE swelling for a couple of weeks and intermittent chest pain today.   PMH: COPD and partial lung removal - R lower lobes.

## 2024-04-21 DIAGNOSIS — I493 Ventricular premature depolarization: Secondary | ICD-10-CM | POA: Diagnosis not present

## 2024-04-21 DIAGNOSIS — I491 Atrial premature depolarization: Secondary | ICD-10-CM | POA: Diagnosis not present

## 2024-04-21 DIAGNOSIS — I440 Atrioventricular block, first degree: Secondary | ICD-10-CM | POA: Diagnosis not present

## 2024-04-21 DIAGNOSIS — I4510 Unspecified right bundle-branch block: Secondary | ICD-10-CM | POA: Diagnosis not present

## 2024-04-21 DIAGNOSIS — R000 Tachycardia, unspecified: Secondary | ICD-10-CM | POA: Diagnosis not present

## 2024-04-29 ENCOUNTER — Telehealth (HOSPITAL_COMMUNITY): Payer: Self-pay | Admitting: Vascular Surgery

## 2024-04-29 ENCOUNTER — Telehealth: Payer: Self-pay

## 2024-04-29 NOTE — Telephone Encounter (Signed)
   Pre-operative Risk Assessment    Patient Name: [name]  DOB: 01/13/1946 MRN: [id number]   Date of last office visit: 07/29/22 ORREN FABRY, PA-C Date of next office visit: NONE   Request for Surgical Clearance    Procedure:  RIGHT TOTAL KNEE ARTHROPLASTY  Date of Surgery:  Clearance TBD                                Surgeon:  LONNI POLI, MD Surgeon's Group or Practice Name:  [facility] Phone number:  [phone] Fax number:  [phone]   Type of Clearance Requested:   - Medical    Type of Anesthesia:  Spinal   AND BLOCK   Additional requests/questions:    SignedLucie DELENA Ku   04/29/2024, 1:02 PM

## 2024-04-29 NOTE — Progress Notes (Signed)
 Anesthesia: Contacted by surgery scheduler at Dr. Lonni Skye office. Patient is being considered for right TKA in the future. Date to be determined, but they noted recent ED visit for chest pain and SOB and inquired about preoperative clearances. Limited review shows he is followed by cardiology already, last encounter ~ 10 months ago for telephonic preoperative evaluation. Would advise updating cardiology clearance for elective TKA, especially given recent ED visit for chest pain.  Appears he is currently up to date with PCP, pulmonology, and CT surgery follow-up, although routine 6 month follow-up with Domenica Harlene LABOR, MD is scheduled in October.  Suggested office reach out to primary care as well. BP 199/86 in the ED, but historically does not appear to run this high.   Isaiah Ruder, PA-C Surgical Short Stay/Anesthesiology [facility] Phone [phone] [facility] Phone [phone] 04/29/2024 11:50 AM

## 2024-04-30 NOTE — Telephone Encounter (Signed)
 Left message for pt to call back to schedule in office appt for preop clearance.

## 2024-04-30 NOTE — Telephone Encounter (Signed)
   Name: [name]  DOB: 1946/06/22  MRN: [id number]  Primary Cardiologist: Maude Emmer, MD  Chart reviewed as part of pre-operative protocol coverage. Because of [name] past medical history and time since last visit, he will require a follow-up in-office visit in order to better assess preoperative cardiovascular risk.  Pre-op covering staff: - Please schedule appointment and call patient to inform them. If patient already had an upcoming appointment within acceptable timeframe, please add pre-op clearance to the appointment notes so provider is aware. - Please contact requesting surgeon's office via preferred method (i.e, phone, fax) to inform them of need for appointment prior to surgery.    Wyn Raddle, Jackee Shove, NP  04/30/2024, 7:57 AM

## 2024-05-05 NOTE — Progress Notes (Unsigned)
 Office Visit    Patient Name: [name] Date of Encounter: 05/06/2024  PCP:  Domenica Harlene LABOR, MD   [facility]  Cardiologist:  Maude Emmer, MD  Advanced Practice Provider:  No care team member to display Electrophysiologist:  None  HPI    [name] is a 78 y.o. male with a past medical history significant for COPD, lung abscess in 1996 status post RML/RLL resection, hypertension and PVCs presents today for preop clearance.   He has a history of atypical chest pain in setting of URI dating back to 2014.  2D echocardiogram in 2014 and 2015 showed normal LV function and no valvular abnormalities.  PVCs were felt to be related to lung disease.  Seen in the clinic 07/09/2016 and was doing quite well.'  He was admitted 1/28 till 09/30/2016 for COPD exacerbation and CAP.  Treated with IV antibiotics, steroids, nebulized bronchodilators.  He has a 57-pack-year history of smoking.  Seen by pulmonology January 2019 for acute sinusitis placed on prednisone  taperm as well as Augmentin .  No history of documented CAD.  He was last seen January 2021 and was doing well at that time.  No cardiac issues.  Breathing was good and BPs were good at home.  I saw him back in Nov of 2023, he tells me that he needs his right shoulder rebuilt. He has not had any heart related issues. He does have chronic SOB which is lung related. Her has not had any palpitations. He wore a monitor back in 2015 which showed frequent PVCs. He is asymptomatic from these. He was tried on a medication he though was a beta blocker in the past but got very lightheaded so it was discontinued. He goes Pharmacist, community and fishing. He is not limited from a CV standpoint however he rode horses for 45 years but after his most recent back surgery he gave riding up due to the pain. He was on Maloxicam for year but then bled so he cannot have NSAIDS. He does have frequent PVCs on EKG today but were present on monitor back  in 2015 and previous EKGs. He scored a 5.62 mets on the DASI, which is higher than the minimum 4 mets requirement.   Reports no shortness of breath nor dyspnea on exertion. Reports no chest pain, pressure, or tightness. No edema, orthopnea, PND. Reports no palpitations.   Today, he presents for preoperative clearance for a right total knee arthroplasty. Referred by Dr. Vernetta for preoperative clearance.  Two weeks ago, he experienced chest pain to the left of the breastbone and swelling in his feet. A workup in the emergency room showed a troponin level of 13, but no myocardial infarction was diagnosed. There has been no recurrence of chest pain, and the swelling has resolved.  He has preatrial complexes and PVCs since age 78, with occasional palpitations that are not bothersome. He takes amlodipine  5 mg for blood pressure control.  He has emphysema and COPD, with two-thirds of his right lung removed. His heart rate increases with minimal exertion. He uses Retrovent for COPD management and experiences no new shortness of breath beyond his usual.  He walks his dog about half a mile daily and performs household tasks without difficulty, though he has some trouble with stairs. He engages in recreational activities such as fishing, taking his boat out once or twice a week with another person for safety.  Reports no shortness of breath nor dyspnea on exertion.  No  orthopnea, PND.   Discussed the use of AI scribe software for clinical note transcription with the patient, who gave verbal consent to proceed.  Past Medical History    Past Medical History:  Diagnosis Date   Allergic rhinitis    Atypical chest pain 12/01/2014   Back pain 01/12/2013   COPD (chronic obstructive pulmonary disease) (HCC)    FeV1 64%-2007   Dyspnea    Dysrhythmia    PVCs   Emphysema    GERD (gastroesophageal reflux disease)    H/O total shoulder replacement, left 2024   H/O total shoulder replacement, right 2023    History of lung abscess    bronchiectasis with RMLandRLL ersection -1996- Dr Brantley   Hordeolum externum (stye) 04/23/2015   Right eye   Hypertension    Impaired vision    glasses   Osteoarthritis    hands and knees   Pneumonia    Sinusitis, acute 04/23/2015   Past Surgical History:  Procedure Laterality Date   COLONOSCOPY  11/20/2010   Dr.Stark   ESOPHAGOGASTRODUODENOSCOPY (EGD) WITH PROPOFOL  N/A 10/31/2021   Procedure: ESOPHAGOGASTRODUODENOSCOPY (EGD) WITH PROPOFOL ;  Surgeon: Avram Lupita BRAVO, MD;  Location: [facility] ENDOSCOPY;  Service: Gastroenterology;  Laterality: N/A;   HEMOSTASIS CONTROL  10/31/2021   Procedure: HEMOSTASIS CONTROL;  Surgeon: Avram Lupita BRAVO, MD;  Location: [facility] ENDOSCOPY;  Service: Gastroenterology;;   HERNIA REPAIR  10/03/2009   HOT HEMOSTASIS N/A 10/31/2021   Procedure: HOT HEMOSTASIS (ARGON PLASMA COAGULATION/BICAP);  Surgeon: Avram Lupita BRAVO, MD;  Location[facility] ENDOSCOPY;  Service: Gastroenterology;  Laterality: N/A;   left knee     lower back surgery  09/02/2008   06/2009   LUNG SURGERY     RML andRUL removed due to bleeding and bronchiectasis and lung abcess   NECK SURGERY  05/03/2008   REVERSE SHOULDER ARTHROPLASTY Right 08/22/2022   Procedure: RIGHT REVERSE SHOULDER ARTHROPLASTY;  Surgeon: Genelle Standing, MD;  Location: [facility] OR;  Service: Orthopedics;  Laterality: Right;   REVERSE SHOULDER ARTHROPLASTY Left 08/05/2023   Procedure: LEFT REVERSE SHOULDER ARTHROPLASTY;  Surgeon: Genelle Standing, MD;  Location: [facility];  Service: Orthopedics;  Laterality: Left;   right foot surgery     SCLEROTHERAPY  10/31/2021   Procedure: SCLEROTHERAPY;  Surgeon: Avram Lupita BRAVO, MD;  Location: [facility] ENDOSCOPY;  Service: Gastroenterology;;   TONSILLECTOMY     removed as a child   VIDEO BRONCHOSCOPY N/A 07/12/2020   Procedure: VIDEO BRONCHOSCOPY;  Surgeon: Kerrin Standing BROCKS, MD;  Location: [facility] OR;  Service: Thoracic;  Laterality: N/A;   VIDEO BRONCHOSCOPY  WITH ENDOBRONCHIAL NAVIGATION N/A 07/12/2020   Procedure: VIDEO BRONCHOSCOPY WITH ENDOBRONCHIAL NAVIGATION;  Surgeon: Kerrin Standing BROCKS, MD;  Location: [facility] OR;  Service: Thoracic;  Laterality: N/A;    Allergies  Allergies  Allergen Reactions   Aspirin      Stomach bleeding    Nsaids     Do not give to patient due to hx of stomach bleeds    Losartan  Other (See Comments)    weakness   Meloxicam      Caused Stomach Bleeds     EKGs/Labs/Other Studies Reviewed:   The following studies were reviewed today:  2D ECHO: 01/03/2015 LV EF: 55% -   60% Study Conclusions - Left ventricle: The cavity size was normal. Wall thickness was   normal. Systolic function was normal. The estimated ejection   fraction was in the range of 55% to 60%. Wall motion was normal;   there were no  regional wall motion abnormalities. Doppler   parameters are consistent with abnormal left ventricular   relaxation (grade 1 diastolic dysfunction). The E/e&' ratio is   between 8-15, suggseting indeterminate LV Filling pressure. - Left atrium: The atrium was normal in size. - Right atrium: The atrium was at the upper limits of normal in   size. - Inferior vena cava: The vessel was normal in size. The   respirophasic diameter changes were in the normal range (>= 50%),   consistent with normal central venous pressure. Impressions: - Compared to a prior echo in 2014, there do not appear to be any   significant changes      EKG:  EKG is  ordered today.  The ekg ordered today demonstrates NSR rate 84 bom with frequent PVCs  Recent Labs: 05/26/2023: ALT 16; BUN 24; Creatinine, Ser 0.82; Hemoglobin 14.6; Platelets 250.0; Potassium 4.4; Sodium 138; TSH 0.64  Recent Lipid Panel    Component Value Date/Time   CHOL 157 05/26/2023 1202   TRIG 159.0 (H) 05/26/2023 1202   HDL 51.40 05/26/2023 1202   CHOLHDL 3 05/26/2023 1202   VLDL 31.8 05/26/2023 1202   LDLCALC 74 05/26/2023 1202   LDLCALC 86 06/26/2020 1158    LDLDIRECT 85 07/22/2014 1031    Home Medications   Current Meds  Medication Sig   acetaminophen  (TYLENOL ) 500 MG tablet Take 1,000 mg by mouth. Takes 1000mg  morning and afternoon and 500mg  at bedtime   albuterol  (VENTOLIN  HFA) 108 (90 Base) MCG/ACT inhaler INHALE 2 PUFFS INTO THE LUNGS EVERY 6 HOURS AS NEEDED FOR WHEEZING OR SHORTNESS OF BREATH.   amLODipine  (NORVASC ) 5 MG tablet Take 1 tablet (5 mg total) by mouth daily.   benzonatate  (TESSALON ) 100 MG capsule TAKE 1 CAPSULE BY MOUTH TWICE DAILY AS NEEDED FOR COUGH   calcium elemental as carbonate (BARIATRIC TUMS ULTRA) 400 MG chewable tablet Chew 1,000 mg by mouth daily.   cetirizine (ZYRTEC) 10 MG tablet Take 10 mg by mouth daily.   fluticasone  (FLONASE ) 50 MCG/ACT nasal spray USE 2 SPRAYS IN EACH NOSTRIL EVERY DAY   Glucosamine-Chondroitin 750-600 MG TABS Take 1 tablet by mouth 2 (two) times daily.   guaiFENesin  (MUCINEX ) 600 MG 12 hr tablet Take 600 mg by mouth 2 (two) times daily.   ipratropium (ATROVENT ) 0.06 % nasal spray Place 1 spray into both nostrils 4 (four) times daily.   loratadine (CLARITIN) 10 MG tablet Take 10 mg by mouth daily.   Menthol, Topical Analgesic, (BLUE-EMU MAXIMUM STRENGTH EX) Apply 1 application  topically daily as needed (arthritis pain).   montelukast  (SINGULAIR ) 10 MG tablet TAKE 1 TABLET EVERY DAY   Multiple Vitamin (MULTIVITAMIN WITH MINERALS) TABS tablet Take 1 tablet by mouth daily.    pantoprazole  (PROTONIX ) 20 MG tablet Take 1 tablet by mouth once daily   traMADol  (ULTRAM ) 50 MG tablet TAKE 1 TABLET BY MOUTH EVERY 12 HOURS AS NEEDED   TRELEGY ELLIPTA  100-62.5-25 MCG/ACT AEPB INHALE 1 PUFF INTO THE LUNGS DAILY   triamcinolone  cream (KENALOG ) 0.1 % Apply 1 Application topically 2 (two) times daily as needed (irritation).     Review of Systems      All other systems reviewed and are otherwise negative except as noted above.  Physical Exam    VS:  BP (!) 120/90   Pulse (!) 108   Ht 5' 7 (1.702  m)   Wt 148 lb (67.1 kg)   SpO2 94%   BMI 23.18 kg/m  , BMI Body  mass index is 23.18 kg/m.  Wt Readings from Last 3 Encounters:  05/06/24 148 lb (67.1 kg)  03/16/24 140 lb (63.5 kg)  12/23/23 145 lb (65.8 kg)     GEN: Well nourished, well developed, in no acute distress. HEENT: normal. Neck: Supple, no JVD, carotid bruits, or masses. Cardiac: IR with PVCs and PACs, no murmurs, rubs, or gallops. No clubbing, cyanosis, edema.  Radials/PT 2+ and equal bilaterally.  Respiratory:  Respirations regular and unlabored, clear to auscultation bilaterally. GI: Soft, nontender, nondistended. MS: No deformity or atrophy. Skin: Warm and dry, no rash. Neuro:  Strength and sensation are intact. Psych: Normal affect.  Assessment & Plan   Preop Clearance  Mr. Ashmead perioperative risk of a major cardiac event is 0.4% according to the Revised Cardiac Risk Index (RCRI).  Therefore, he is at low risk for perioperative complications.   His functional capacity is good at 5.81 METs according to the [facility] Activity Status Index (DASI). Recommendations: According to ACC/AHA guidelines, no further cardiovascular testing needed.  The patient may proceed to surgery at acceptable risk.    Preoperative cardiovascular evaluation for right total knee arthroplasty Scheduled for right total knee arthroplasty. Recent chest pain and foot swelling evaluated with no significant findings. Troponin levels non-significant, EKG showed chronic PACs and PVCs. Heart rate slightly elevated due to exertion from COPD and previous lung resection. - Send clearance for right total knee arthroplasty.  Ventricular and atrial premature complexes Chronic PACs and PVCs with no significant symptoms or changes. Present since age 62 without recent exacerbations. -thought to be due to lung disease  Essential hypertension Controlled on amlodipine  5 mg daily. Recent labs normal for kidney function and electrolytes.  Chronic obstructive  pulmonary disease status post right lung resection COPD with previous right lung resection. No new shortness of breath beyond baseline. Exertion increases heart rate due to reduced lung capacity. Managed with current medications.     Disposition: Follow up 6 months with Maude Emmer, MD or APP.  Signed, Orren LOISE Fabry, PA-C 05/06/2024, 2:41 PM [facility]

## 2024-05-06 ENCOUNTER — Encounter: Payer: Self-pay | Admitting: Physician Assistant

## 2024-05-06 ENCOUNTER — Other Ambulatory Visit: Payer: Self-pay | Admitting: Family Medicine

## 2024-05-06 ENCOUNTER — Ambulatory Visit: Attending: Physician Assistant | Admitting: Physician Assistant

## 2024-05-06 VITALS — BP 120/90 | HR 108 | Ht 67.0 in | Wt 148.0 lb

## 2024-05-06 DIAGNOSIS — I059 Rheumatic mitral valve disease, unspecified: Secondary | ICD-10-CM | POA: Diagnosis not present

## 2024-05-06 DIAGNOSIS — J441 Chronic obstructive pulmonary disease with (acute) exacerbation: Secondary | ICD-10-CM | POA: Diagnosis not present

## 2024-05-06 DIAGNOSIS — I10 Essential (primary) hypertension: Secondary | ICD-10-CM

## 2024-05-06 DIAGNOSIS — I493 Ventricular premature depolarization: Secondary | ICD-10-CM | POA: Diagnosis not present

## 2024-05-06 NOTE — Patient Instructions (Signed)
 Medication Instructions:  Your physician recommends that you continue on your current medications as directed. Please refer to the Current Medication list given to you today.  *If you need a refill on your cardiac medications before your next appointment, please call your pharmacy*  Follow-Up: At [facility], you and your health needs are our priority.  As part of our continuing mission to provide you with exceptional heart care, our providers are all part of one team.  This team includes your primary Cardiologist (physician) and Advanced Practice Providers or APPs (Physician Assistants and Nurse Practitioners) who all work together to provide you with the care you need, when you need it.  Your next appointment:   6 month(s)  Provider:   Maude Emmer, MD

## 2024-05-07 ENCOUNTER — Telehealth: Payer: Self-pay

## 2024-05-07 NOTE — Telephone Encounter (Signed)
 Patient had surgical clearance scheduled on 05/11/24 @1 :00 PM.

## 2024-05-07 NOTE — Telephone Encounter (Signed)
 Copied from CRM [id number]. Topic: General - Other >> May 07, 2024  3:39 PM Thersia C wrote: Reason for CRM: Patient called in regarding paperwork on clearance for his knee replacement , stated Maralee has already faxed it over is waiting for provider to signed back so he can get his surgery, would like a callback regarding this   [phone]

## 2024-05-10 ENCOUNTER — Encounter: Admitting: Orthopaedic Surgery

## 2024-05-10 NOTE — Progress Notes (Unsigned)
 Subjective:      HPI: Pt is a 78 y.o. male who is here for preoperative clearance for RIGHT TOTAL KNEE ARTHROPLASTY. Surgical date has not been set yet. TBD.     Patient denies fever, chills, SOB, DOE, CP, palpitations, dyspnea, edema, HA, vision changes, N/V/D, abdominal pain, urinary symptoms, rash, weight changes, and recent illness or hospitalizations.   1) High Risk Cardiac Conditions:  1) Recent MI - No.  2) Decompensated Heart Failure - No.  3) Unstable angina - No.  4) Symptomatic arrythmia - No.  5) Sx Valvular Disease - No.  2) Intermediate Risk Factors: DM, CKD, CVA, CHF, CAD - No.  2) Functional Status: > 4 mets (Walk, run, climb stairs) Yes.  [name] Activity Status Index:  28.7 points  3) Surgery Specific Risk: High (Emergency, Vascular, Intra-abdominal, Extensive ops)          Intermediate (Carotid, Head and Neck, Orthopaedic )          Low (Endoscopic, Cataract, Breast )  4) Further Noninvasive evaluation:   1) EKG - Yes.       2) Echo - No.   Worsening dyspnea or CHF without an echo in the past year  3) Stress Testing - Active Cardiac Disease - No.  4) CXR Yes.     If asymptomatic, healthy, no respiratory symptoms it is not needed  5)PFTs No.   OSA, OHS, or significant cardiopulmonary history  5) Need for medical therapy - Beta Blocker, Statins indicated ? No.  Social history:  Relevant past medical, surgical, family and social history reviewed and updated as indicated. Interim medical history since our last visit reviewed.  Allergies and medications reviewed and updated.  DATA REVIEWED: CHART IN EPIC  ROS: Negative unless specifically indicated above in HPI.    Current Outpatient Medications:    acetaminophen  (TYLENOL ) 500 MG tablet, Take 1,000 mg by mouth. Takes 1000mg  morning and afternoon and 500mg  at bedtime, Disp: , Rfl:    albuterol  (VENTOLIN  HFA) 108 (90 Base) MCG/ACT inhaler, INHALE 2 PUFFS INTO THE LUNGS EVERY 6 HOURS AS NEEDED FOR  WHEEZING OR SHORTNESS OF BREATH., Disp: 2 each, Rfl: 10   amLODipine  (NORVASC ) 5 MG tablet, Take 1 tablet (5 mg total) by mouth daily., Disp: 90 tablet, Rfl: 1   benzonatate  (TESSALON ) 100 MG capsule, TAKE 1 CAPSULE BY MOUTH TWICE DAILY AS NEEDED FOR COUGH, Disp: 90 capsule, Rfl: 0   calcium elemental as carbonate (BARIATRIC TUMS ULTRA) 400 MG chewable tablet, Chew 1,000 mg by mouth daily., Disp: , Rfl:    cetirizine (ZYRTEC) 10 MG tablet, Take 10 mg by mouth daily., Disp: , Rfl:    fluticasone  (FLONASE ) 50 MCG/ACT nasal spray, USE 2 SPRAYS IN EACH NOSTRIL EVERY DAY, Disp: 48 g, Rfl: 3   Glucosamine-Chondroitin 750-600 MG TABS, Take 1 tablet by mouth 2 (two) times daily., Disp: , Rfl:    guaiFENesin  (MUCINEX ) 600 MG 12 hr tablet, Take 600 mg by mouth 2 (two) times daily., Disp: , Rfl:    ipratropium (ATROVENT ) 0.06 % nasal spray, Place 1 spray into both nostrils 4 (four) times daily., Disp: 15 mL, Rfl: 12   loratadine (CLARITIN) 10 MG tablet, Take 10 mg by mouth daily., Disp: , Rfl:    Menthol, Topical Analgesic, (BLUE-EMU MAXIMUM STRENGTH EX), Apply 1 application  topically daily as needed (arthritis pain)., Disp: , Rfl:    montelukast  (SINGULAIR ) 10 MG tablet, TAKE 1 TABLET EVERY DAY, Disp: 90 tablet,  Rfl: 3   Multiple Vitamin (MULTIVITAMIN WITH MINERALS) TABS tablet, Take 1 tablet by mouth daily. , Disp: , Rfl:    pantoprazole  (PROTONIX ) 20 MG tablet, Take 1 tablet by mouth once daily, Disp: 90 tablet, Rfl: 3   traMADol  (ULTRAM ) 50 MG tablet, TAKE 1 TABLET BY MOUTH EVERY 12 HOURS AS NEEDED, Disp: 60 tablet, Rfl: 0   TRELEGY ELLIPTA  100-62.5-25 MCG/ACT AEPB, INHALE 1 PUFF INTO THE LUNGS DAILY, Disp: 60 each, Rfl: 11   triamcinolone  cream (KENALOG ) 0.1 %, Apply 1 Application topically 2 (two) times daily as needed (irritation)., Disp: , Rfl:       Objective:    BP 138/80 (BP Location: Right Arm, Patient Position: Sitting, Cuff Size: Normal)   Pulse 86   Temp 98 F (36.7 C) (Oral)   Resp 12    Ht 5' 7 (1.702 m)   Wt 149 lb 12.8 oz (67.9 kg)   SpO2 96%   BMI 23.46 kg/m   Wt Readings from Last 3 Encounters:  05/11/24 149 lb 12.8 oz (67.9 kg)  05/06/24 148 lb (67.1 kg)  03/16/24 140 lb (63.5 kg)    Physical Exam Constitutional:      General: He is not in acute distress.    Appearance: He is not ill-appearing, toxic-appearing or diaphoretic.  HENT:     Head: Normocephalic and atraumatic.     Right Ear: Tympanic membrane, ear canal and external ear normal.     Left Ear: Tympanic membrane, ear canal and external ear normal.     Nose: Nose normal. No congestion.     Mouth/Throat:     Mouth: Mucous membranes are moist.     Pharynx: Oropharynx is clear.  Eyes:     Extraocular Movements: Extraocular movements intact.     Right eye: Normal extraocular motion.     Left eye: Normal extraocular motion.     Conjunctiva/sclera: Conjunctivae normal.     Pupils: Pupils are equal, round, and reactive to light.  Neck:     Thyroid : No thyroid  mass or thyromegaly.     Vascular: No carotid bruit or JVD.  Cardiovascular:     Rate and Rhythm: Regular rhythm.     Pulses: Normal pulses.          Radial pulses are 2+ on the right side and 2+ on the left side.       Dorsalis pedis pulses are 2+ on the right side and 2+ on the left side.     Heart sounds: Normal heart sounds, S1 normal and S2 normal. No murmur heard.    No friction rub. No gallop.     Comments: W/ PACs/ PVCs, No murmur Pulmonary:     Effort: Pulmonary effort is normal. No respiratory distress.     Breath sounds: Normal breath sounds.  Abdominal:     General: Bowel sounds are normal. There is no distension.     Palpations: Abdomen is soft.     Tenderness: There is no abdominal tenderness. There is no guarding.  Musculoskeletal:        General: Normal range of motion.     Cervical back: Full passive range of motion without pain and normal range of motion. No edema or erythema.     Right lower leg: No edema.     Left  lower leg: No edema.  Lymphadenopathy:     Cervical: No cervical adenopathy.  Skin:    General: Skin is warm and dry.  Capillary Refill: Capillary refill takes less than 2 seconds.  Neurological:     General: No focal deficit present.     Mental Status: He is alert and oriented to person, place, and time.     Cranial Nerves: No cranial nerve deficit.     Motor: No weakness.     Coordination: Coordination normal.     Gait: Gait normal.     Deep Tendon Reflexes: Reflexes normal.     Comments: Ambulates with cane  Psychiatric:        Mood and Affect: Mood normal.        Behavior: Behavior normal.        Thought Content: Thought content normal.        Assessment & Plan:  Pre-operative clearance -     CBC with Differential/Platelet -     Comprehensive metabolic panel with GFR -     Protime-INR; Future -     POCT UA - Glucose/Protein -     Urine Culture -     DG Chest 2 View; Future  Benign essential HTN Assessment & Plan: Well controlled, no changes to meds. Encouraged heart healthy diet such as the DASH diet and exercise as tolerated.     COPD- Denies recent exacerbations. Stable on current medications.  I have independently evaluated patient.  [name] is a 78 y.o. male who is low-moderate risk for a intermediate risk surgery.  There are not modifiable risk factors (smoking, etc). [name]'s risk of major cardiac event is 0.5% according to the Revised Cardiac Risk Index (RCRI).    Pt cleared medically. Pt seen by cardiology on 05/06/2024  and has surgical clearance form cardiology.    No follow-ups on file.  Tamrah Victorino L. Taneshia Lorence, DNP, AGNP-C

## 2024-05-11 ENCOUNTER — Ambulatory Visit (INDEPENDENT_AMBULATORY_CARE_PROVIDER_SITE_OTHER): Admitting: Student

## 2024-05-11 ENCOUNTER — Encounter: Payer: Self-pay | Admitting: Student

## 2024-05-11 VITALS — BP 138/80 | HR 86 | Temp 98.0°F | Resp 12 | Ht 67.0 in | Wt 149.8 lb

## 2024-05-11 DIAGNOSIS — I10 Essential (primary) hypertension: Secondary | ICD-10-CM | POA: Diagnosis not present

## 2024-05-11 DIAGNOSIS — Z01818 Encounter for other preprocedural examination: Secondary | ICD-10-CM | POA: Diagnosis not present

## 2024-05-11 NOTE — Assessment & Plan Note (Signed)
 Well controlled, no changes to meds. Encouraged heart healthy diet such as the DASH diet and exercise as tolerated.

## 2024-05-11 NOTE — Patient Instructions (Signed)
 Get Tdap and Covid vaccines at pharmacy

## 2024-05-12 LAB — COMPREHENSIVE METABOLIC PANEL WITH GFR
ALT: 19 U/L (ref 0–53)
AST: 23 U/L (ref 0–37)
Albumin: 4.1 g/dL (ref 3.5–5.2)
Alkaline Phosphatase: 67 U/L (ref 39–117)
BUN: 25 mg/dL — ABNORMAL HIGH (ref 6–23)
CO2: 28 meq/L (ref 19–32)
Calcium: 9.5 mg/dL (ref 8.4–10.5)
Chloride: 102 meq/L (ref 96–112)
Creatinine, Ser: 0.73 mg/dL (ref 0.40–1.50)
GFR: 87.76 mL/min (ref >60.00)
Glucose, Bld: 100 mg/dL — ABNORMAL HIGH (ref 70–99)
Potassium: 4.6 meq/L (ref 3.5–5.1)
Sodium: 139 meq/L (ref 135–145)
Total Bilirubin: 0.5 mg/dL (ref 0.2–1.2)
Total Protein: 6.8 g/dL (ref 6.0–8.3)

## 2024-05-12 LAB — CBC WITH DIFFERENTIAL/PLATELET
Basophils Absolute: 0 K/uL (ref 0.0–0.1)
Basophils Relative: 0.5 % (ref 0.0–3.0)
Eosinophils Absolute: 0.1 K/uL (ref 0.0–0.7)
Eosinophils Relative: 1.8 % (ref 0.0–5.0)
HCT: 44.7 % (ref 39.0–52.0)
Hemoglobin: 14.9 g/dL (ref 13.0–17.0)
Lymphocytes Relative: 23 % (ref 12.0–46.0)
Lymphs Abs: 1.4 K/uL (ref 0.7–4.0)
MCHC: 33.3 g/dL (ref 30.0–36.0)
MCV: 96 fl (ref 78.0–100.0)
Monocytes Absolute: 0.6 K/uL (ref 0.1–1.0)
Monocytes Relative: 8.8 % (ref 3.0–12.0)
Neutro Abs: 4.1 K/uL (ref 1.4–7.7)
Neutrophils Relative %: 65.9 % (ref 43.0–77.0)
Platelets: 247 K/uL (ref 150.0–400.0)
RBC: 4.65 Mil/uL (ref 4.22–5.81)
RDW: 14.1 % (ref 11.5–15.5)
WBC: 6.2 K/uL (ref 4.0–10.5)

## 2024-05-12 LAB — URINALYSIS, DIPSTICK ONLY
Bilirubin, UA: NEGATIVE
Glucose, UA: NEGATIVE
Ketones, UA: NEGATIVE
Leukocytes,UA: NEGATIVE
Nitrite, UA: NEGATIVE
Protein,UA: NEGATIVE
RBC, UA: NEGATIVE
Specific Gravity, UA: 1.018 (ref 1.005–1.030)
Urobilinogen, Ur: 0.2 mg/dL (ref 0.2–1.0)
pH, UA: 7 (ref 5.0–7.5)

## 2024-05-12 LAB — URINE CULTURE
MICRO NUMBER:: 16942920
Result:: NO GROWTH
SPECIMEN QUALITY:: ADEQUATE

## 2024-05-12 LAB — PROTIME-INR
INR: 1
Prothrombin Time: 10.6 s (ref 9.0–11.5)

## 2024-05-13 ENCOUNTER — Ambulatory Visit: Payer: Self-pay | Admitting: Student

## 2024-05-18 ENCOUNTER — Encounter: Payer: Self-pay | Admitting: *Deleted

## 2024-05-19 ENCOUNTER — Other Ambulatory Visit: Payer: Self-pay | Admitting: Physician Assistant

## 2024-05-19 DIAGNOSIS — Z01818 Encounter for other preprocedural examination: Secondary | ICD-10-CM

## 2024-05-28 NOTE — Progress Notes (Addendum)
 COVID Vaccine received:  []  No []  Yes Date of any COVID positive Test in last 90 days:  PCP - Harlene Horton MD Cardiologist - Dr. Maude Emmer  Chest x-ray - CT chest 11/11/23 Epic EKG -  05/06/24 Epic Stress Test -  ECHO - 02/20/23 Epic Cardiac Cath -   Cardiac clearance-05/06/24- Orren Fabry PA-C Medical clearance- 05/11/24- Harlene Jolly NP  Bowel Prep - []  No  []   Yes ______  Pacemaker / ICD device []  No []  Yes   Spinal Cord Stimulator:[]  No []  Yes       History of Sleep Apnea? []  No []  Yes   CPAP used?- []  No []  Yes    Does the patient monitor blood sugar?          []  No []  Yes  []  N/A  Patient has: []  NO Hx DM   []  Pre-DM                 []  DM1  []   DM2 Does patient have a [employer] or Dexacom? []  No []  Yes   Fasting Blood Sugar Ranges-  Checks Blood Sugar _____ times a day  GLP1 agonist / usual dose -  GLP1 instructions:  SGLT-2 inhibitors / usual dose -  SGLT-2 instructions:   Blood Thinner / Instructions: Aspirin  Instructions:  Comments:   Activity level: Patient is able / unable to climb a flight of stairs without difficulty; []  No CP  []  No SOB, but would have ___   Patient can / can not perform ADLs without assistance.   Anesthesia review:   Patient denies shortness of breath, fever, cough and chest pain at PAT appointment.  Patient verbalized understanding and agreement to the Pre-Surgical Instructions that were given to them at this PAT appointment. Patient was also educated of the need to review these PAT instructions again prior to his/her surgery.I reviewed the appropriate phone numbers to call if they have any and questions or concerns.

## 2024-05-28 NOTE — Patient Instructions (Addendum)
 SURGICAL WAITING ROOM VISITATION  Patients having surgery or a procedure may have no more than 2 support people in the waiting area - these visitors may rotate.    Children under the age of 74 must have an adult with them who is not the patient.  Visitors with respiratory illnesses are discouraged from visiting and should remain at home.  If the patient needs to stay at the hospital during part of their recovery, the visitor guidelines for inpatient rooms apply. Pre-op nurse will coordinate an appropriate time for 1 support person to accompany patient in pre-op.  This support person may not rotate.    Please refer to the [facility] website for the visitor guidelines for Inpatients (after your surgery is over and you are in a regular room).       Your procedure is scheduled on: 06/04/24   Report to [facility] Main Entrance    Report to admitting at 6 AM   Call this number if you have problems the morning of surgery [phone]   Do not eat food :After Midnight.   After Midnight you may have the following liquids until 5:30 AM DAY OF SURGERY  Water  Non-Citrus Juices (without pulp, NO RED-Apple, White grape, White cranberry) Black Coffee (NO MILK/CREAM OR CREAMERS, sugar ok)  Clear Tea (NO MILK/CREAM OR CREAMERS, sugar ok) regular and decaf                             Plain Jell-O (NO RED)                                           Fruit ices (not with fruit pulp, NO RED)                                     Popsicles (NO RED)                                                               Sports drinks like Gatorade (NO RED)                  The day of surgery:  Drink ONE (1) Pre-Surgery Clear Ensure  at 5:30 AM the morning of surgery. Drink in one sitting. Do not sip.  This drink was given to you during your hospital  pre-op appointment visit. Nothing else to drink after completing the  Pre-Surgery Clear Ensure.     Oral Hygiene is also important to reduce your risk  of infection.                                    Remember - BRUSH YOUR TEETH THE MORNING OF SURGERY WITH YOUR REGULAR TOOTHPASTE  DENTURES WILL BE REMOVED PRIOR TO SURGERY PLEASE DO NOT APPLY Poly grip OR ADHESIVES!!!   Do NOT smoke after Midnight   Stop all vitamins and herbal supplements 7 days before surgery.   Take these medicines the morning of surgery with  A SIP OF WATER : Tylenol  if needed, Amlodipine , cetirizine,  montelukast , inhaler, nasal spray.             You may not have any metal on your body including  jewelry, and body piercing             Do not wear lotions, powders, perfumes/cologne, or deodorant              Men may shave face and neck.   Do not bring valuables to the hospital. [facility] IS NOT             RESPONSIBLE   FOR VALUABLES.   Contacts, glasses, dentures or bridgework may not be worn into surgery.   Bring small overnight bag day of surgery.   DO NOT BRING YOUR HOME MEDICATIONS TO THE HOSPITAL. PHARMACY WILL DISPENSE MEDICATIONS LISTED ON YOUR MEDICATION LIST TO YOU DURING YOUR ADMISSION IN THE HOSPITAL!    Patients discharged on the day of surgery will not be allowed to drive home.  Someone NEEDS to stay with you for the first 24 hours after anesthesia.   Special Instructions: Bring a copy of your healthcare power of attorney and living will documents the day of surgery if you haven't scanned them before.              Please read over the following fact sheets you were given: IF YOU HAVE QUESTIONS ABOUT YOUR PRE-OP INSTRUCTIONS PLEASE CALL [phone] [name]   If you received a COVID test during your pre-op visit  it is requested that you wear a mask when out in public, stay away from anyone that may not be feeling well and notify your surgeon if you develop symptoms. If you test positive for Covid or have been in contact with anyone that has tested positive in the last 10 days please notify you surgeon.      Pre-operative 5 CHG Bath Instructions    You can play a key role in reducing the risk of infection after surgery. Your skin needs to be as free of germs as possible. You can reduce the number of germs on your skin by washing with CHG (chlorhexidine  gluconate) soap before surgery. CHG is an antiseptic soap that kills germs and continues to kill germs even after washing.   DO NOT use if you have an allergy to chlorhexidine /CHG or antibacterial soaps. If your skin becomes reddened or irritated, stop using the CHG and notify one of our RNs at [phone].   Please shower with the CHG soap starting 4 days before surgery using the following schedule:     Please keep in mind the following:  DO NOT shave, including legs and underarms, starting the day of your first shower.   You may shave your face at any point before/day of surgery.  Place clean sheets on your bed the day you start using CHG soap. Use a clean washcloth (not used since being washed) for each shower. DO NOT sleep with pets once you start using the CHG.   CHG Shower Instructions:  If you choose to wash your hair and private area, wash first with your normal shampoo/soap.  After you use shampoo/soap, rinse your hair and body thoroughly to remove shampoo/soap residue.  Turn the water  OFF and apply about 3 tablespoons (45 ml) of CHG soap to a CLEAN washcloth.  Apply CHG soap ONLY FROM YOUR NECK DOWN TO YOUR TOES (washing for 3-5 minutes)  DO NOT use CHG soap on  face, private areas, open wounds, or sores.  Pay special attention to the area where your surgery is being performed.  If you are having back surgery, having someone wash your back for you may be helpful. Wait 2 minutes after CHG soap is applied, then you may rinse off the CHG soap.  Pat dry with a clean towel  Put on clean clothes/pajamas   If you choose to wear lotion, please use ONLY the CHG-compatible lotions on the back of this paper.     Additional instructions for the day of surgery: DO NOT APPLY any  lotions, deodorants, cologne, or perfumes.   Put on clean/comfortable clothes.  Brush your teeth.  Ask your nurse before applying any prescription medications to the skin.      CHG Compatible Lotions   Aveeno Moisturizing lotion  Cetaphil Moisturizing Cream  Cetaphil Moisturizing Lotion  Clairol Herbal Essence Moisturizing Lotion, Dry Skin  Clairol Herbal Essence Moisturizing Lotion, Extra Dry Skin  Clairol Herbal Essence Moisturizing Lotion, Normal Skin  Curel Age Defying Therapeutic Moisturizing Lotion with Alpha Hydroxy  Curel Extreme Care Body Lotion  Curel Soothing Hands Moisturizing Hand Lotion  Curel Therapeutic Moisturizing Cream, Fragrance-Free  Curel Therapeutic Moisturizing Lotion, Fragrance-Free  Curel Therapeutic Moisturizing Lotion, Original Formula  Eucerin Daily Replenishing Lotion  Eucerin Dry Skin Therapy Plus Alpha Hydroxy Crme  Eucerin Dry Skin Therapy Plus Alpha Hydroxy Lotion  Eucerin Original Crme  Eucerin Original Lotion  Eucerin Plus Crme Eucerin Plus Lotion  Eucerin TriLipid Replenishing Lotion  Keri Anti-Bacterial Hand Lotion  Keri Deep Conditioning Original Lotion Dry Skin Formula Softly Scented  Keri Deep Conditioning Original Lotion, Fragrance Free Sensitive Skin Formula  Keri Lotion Fast Absorbing Fragrance Free Sensitive Skin Formula  Keri Lotion Fast Absorbing Softly Scented Dry Skin Formula  Keri Original Lotion  Keri Skin Renewal Lotion Keri Silky Smooth Lotion  Keri Silky Smooth Sensitive Skin Lotion  Nivea Body Creamy Conditioning Oil  Nivea Body Extra Enriched Lotion  Nivea Body Original Lotion  Nivea Body Sheer Moisturizing Lotion Nivea Crme  Nivea Skin Firming Lotion  NutraDerm 30 Skin Lotion  NutraDerm Skin Lotion  NutraDerm Therapeutic Skin Cream  NutraDerm Therapeutic Skin Lotion  ProShield Protective Hand Cream   Incentive Spirometer  An incentive spirometer is a tool that can help keep your lungs clear and active.  This tool measures how well you are filling your lungs with each breath. Taking long deep breaths may help reverse or decrease the chance of developing breathing (pulmonary) problems (especially infection) following: A long period of time when you are unable to move or be active. BEFORE THE PROCEDURE  If the spirometer includes an indicator to show your best effort, your nurse or respiratory therapist will set it to a desired goal. If possible, sit up straight or lean slightly forward. Try not to slouch. Hold the incentive spirometer in an upright position. INSTRUCTIONS FOR USE  Sit on the edge of your bed if possible, or sit up as far as you can in bed or on a chair. Hold the incentive spirometer in an upright position. Breathe out normally. Place the mouthpiece in your mouth and seal your lips tightly around it. Breathe in slowly and as deeply as possible, raising the piston or the ball toward the top of the column. Hold your breath for 3-5 seconds or for as long as possible. Allow the piston or ball to fall to the bottom of the column. Remove the mouthpiece from your mouth and breathe out  normally. Rest for a few seconds and repeat Steps 1 through 7 at least 10 times every 1-2 hours when you are awake. Take your time and take a few normal breaths between deep breaths. The spirometer may include an indicator to show your best effort. Use the indicator as a goal to work toward during each repetition. After each set of 10 deep breaths, practice coughing to be sure your lungs are clear. If you have an incision (the cut made at the time of surgery), support your incision when coughing by placing a pillow or rolled up towels firmly against it. Once you are able to get out of bed, walk around indoors and cough well. You may stop using the incentive spirometer when instructed by your caregiver.  RISKS AND COMPLICATIONS Take your time so you do not get dizzy or light-headed. If you are in pain, you may  need to take or ask for pain medication before doing incentive spirometry. It is harder to take a deep breath if you are having pain. AFTER USE Rest and breathe slowly and easily. It can be helpful to keep track of a log of your progress. Your caregiver can provide you with a simple table to help with this. If you are using the spirometer at home, follow these instructions: SEEK MEDICAL CARE IF:  You are having difficultly using the spirometer. You have trouble using the spirometer as often as instructed. Your pain medication is not giving enough relief while using the spirometer. You develop fever of 100.5 F (38.1 C) or higher. SEEK IMMEDIATE MEDICAL CARE IF:  You cough up bloody sputum that had not been present before. You develop fever of 102 F (38.9 C) or greater. You develop worsening pain at or near the incision site. MAKE SURE YOU:  Understand these instructions. Will watch your condition. Will get help right away if you are not doing well or get worse..  WHAT IS A BLOOD TRANSFUSION? Blood Transfusion Information  A transfusion is the replacement of blood or some of its parts. Blood is made up of multiple cells which provide different functions. Red blood cells carry oxygen and are used for blood loss replacement. White blood cells fight against infection. Platelets control bleeding. Plasma helps clot blood. Other blood products are available for specialized needs, such as hemophilia or other clotting disorders. BEFORE THE TRANSFUSION  Who gives blood for transfusions?  Healthy volunteers who are fully evaluated to make sure their blood is safe. This is blood bank blood. Transfusion therapy is the safest it has ever been in the practice of medicine. Before blood is taken from a donor, a complete history is taken to make sure that person has no history of diseases nor engages in risky social behavior (examples are intravenous drug use or sexual activity with multiple  partners). The donor's travel history is screened to minimize risk of transmitting infections, such as malaria. The donated blood is tested for signs of infectious diseases, such as HIV and hepatitis. The blood is then tested to be sure it is compatible with you in order to minimize the chance of a transfusion reaction. If you or a relative donates blood, this is often done in anticipation of surgery and is not appropriate for emergency situations. It takes many days to process the donated blood. RISKS AND COMPLICATIONS Although transfusion therapy is very safe and saves many lives, the main dangers of transfusion include:  Getting an infectious disease. Developing a transfusion reaction. This is an allergic  reaction to something in the blood you were given. Every precaution is taken to prevent this. The decision to have a blood transfusion has been considered carefully by your caregiver before blood is given. Blood is not given unless the benefits outweigh the risks. AFTER THE TRANSFUSION Right after receiving a blood transfusion, you will usually feel much better and more energetic. This is especially true if your red blood cells have gotten low (anemic). The transfusion raises the level of the red blood cells which carry oxygen, and this usually causes an energy increase. The nurse administering the transfusion will monitor you carefully for complications. HOME CARE INSTRUCTIONS  No special instructions are needed after a transfusion. You may find your energy is better. Speak with your caregiver about any limitations on activity for underlying diseases you may have. SEEK MEDICAL CARE IF:  Your condition is not improving after your transfusion. You develop redness or irritation at the intravenous (IV) site. SEEK IMMEDIATE MEDICAL CARE IF:  Any of the following symptoms occur over the next 12 hours: Shaking chills. You have a temperature by mouth above 102 F (38.9 C), not controlled by  medicine. Chest, back, or muscle pain. People around you feel you are not acting correctly or are confused. Shortness of breath or difficulty breathing. Dizziness and fainting. You get a rash or develop hives. You have a decrease in urine output. Your urine turns a dark color or changes to pink, red, or brown. Any of the following symptoms occur over the next 10 days: You have a temperature by mouth above 102 F (38.9 C), not controlled by medicine. Shortness of breath. Weakness after normal activity. The white part of the eye turns yellow (jaundice). You have a decrease in the amount of urine or are urinating less often. Your urine turns a dark color or changes to pink, red, or brown. Document Released: 08/16/2000 Document Revised: 11/11/2011 Document Reviewed: 04/04/2008 [facility] Patient Information 2014 [place], [place].

## 2024-05-31 ENCOUNTER — Encounter (HOSPITAL_COMMUNITY)
Admission: RE | Admit: 2024-05-31 | Discharge: 2024-05-31 | Disposition: A | Source: Ambulatory Visit | Attending: Orthopaedic Surgery

## 2024-05-31 ENCOUNTER — Other Ambulatory Visit: Payer: Self-pay

## 2024-05-31 ENCOUNTER — Encounter (HOSPITAL_COMMUNITY): Payer: Self-pay

## 2024-05-31 VITALS — BP 144/90 | HR 84 | Temp 98.4°F | Resp 16 | Ht 67.0 in | Wt 146.2 lb

## 2024-05-31 DIAGNOSIS — J449 Chronic obstructive pulmonary disease, unspecified: Secondary | ICD-10-CM | POA: Insufficient documentation

## 2024-05-31 DIAGNOSIS — I493 Ventricular premature depolarization: Secondary | ICD-10-CM | POA: Diagnosis not present

## 2024-05-31 DIAGNOSIS — Z01812 Encounter for preprocedural laboratory examination: Secondary | ICD-10-CM | POA: Diagnosis not present

## 2024-05-31 DIAGNOSIS — M1711 Unilateral primary osteoarthritis, right knee: Secondary | ICD-10-CM | POA: Diagnosis not present

## 2024-05-31 DIAGNOSIS — Z87891 Personal history of nicotine dependence: Secondary | ICD-10-CM | POA: Insufficient documentation

## 2024-05-31 DIAGNOSIS — I10 Essential (primary) hypertension: Secondary | ICD-10-CM | POA: Diagnosis not present

## 2024-05-31 DIAGNOSIS — Z01818 Encounter for other preprocedural examination: Secondary | ICD-10-CM

## 2024-05-31 HISTORY — DX: Unspecified malignant neoplasm of skin, unspecified: C44.90

## 2024-05-31 LAB — SURGICAL PCR SCREEN
MRSA, PCR: NEGATIVE
Staphylococcus aureus: NEGATIVE

## 2024-05-31 LAB — BASIC METABOLIC PANEL WITH GFR
Anion gap: 10 (ref 5–15)
BUN: 19 mg/dL (ref 8–23)
CO2: 25 mmol/L (ref 22–32)
Calcium: 9.8 mg/dL (ref 8.9–10.3)
Chloride: 104 mmol/L (ref 98–111)
Creatinine, Ser: 0.75 mg/dL (ref 0.61–1.24)
GFR, Estimated: >60 mL/min (ref >60)
Glucose, Bld: 94 mg/dL (ref 70–99)
Potassium: 4.9 mmol/L (ref 3.5–5.1)
Sodium: 139 mmol/L (ref 135–145)

## 2024-05-31 LAB — CBC
HCT: 48.8 % (ref 39.0–52.0)
Hemoglobin: 15.4 g/dL (ref 13.0–17.0)
MCH: 31 pg (ref 26.0–34.0)
MCHC: 31.6 g/dL (ref 30.0–36.0)
MCV: 98.4 fL (ref 80.0–100.0)
Platelets: 252 K/uL (ref 150–400)
RBC: 4.96 MIL/uL (ref 4.22–5.81)
RDW: 12.6 % (ref 11.5–15.5)
WBC: 6.6 K/uL (ref 4.0–10.5)
nRBC: 0 % (ref 0.0–0.2)

## 2024-05-31 NOTE — Progress Notes (Signed)
 PCP - Harlene Horton MD Cardiologist - Dr. Maude Emmer   Chest x-ray - CT chest 11/11/23 Epic EKG -  05/06/24 Epic Stress Test - N/A ECHO - 02/20/23 Epic Cardiac Cath - N/A   Cardiac clearance-05/06/24- Orren Fabry PA-C Medical clearance- 05/11/24- Harlene Jolly NP   Bowel Prep - [x]  No  []   Yes ______   Pacemaker / ICD device [x]  No []  Yes   Spinal Cord Stimulator:[x]  No []  Yes       History of Sleep Apnea? [x]  No []  Yes   CPAP used?- [x]  No []  Yes     Does the patient monitor blood sugar?          [x]  No []  Yes  []  N/A   Patient has: [x]  NO Hx DM   []  Pre-DM                 []  DM1  []   DM2 Does patient have a [employer] or Dexacom? [x]  No []  Yes   Fasting Blood Sugar Ranges-N/A  Checks Blood Sugar ___N/A__ times a day   GLP1 agonist / usual dose - N/A GLP1 instructions: N/A SGLT-2 inhibitors / usual dose - N/A SGLT-2 instructions: N/A   Blood Thinner / Instructions:N/A Aspirin  Instructions:N/A      Activity level: Patient is unable to climb a flight of stairs without difficulty   Anesthesia review: COPD   Patient denies shortness of breath, fever, cough and chest pain at PAT appointment.   Patient verbalized understanding and agreement to the Pre-Surgical Instructions that were given to them at this PAT appointment. Patient was also educated of the need to review these PAT instructions again prior to his/her surgery.I reviewed the appropriate phone numbers to call if they have any and questions or concerns.

## 2024-06-01 DIAGNOSIS — Z8042 Family history of malignant neoplasm of prostate: Secondary | ICD-10-CM | POA: Diagnosis not present

## 2024-06-01 DIAGNOSIS — Z87891 Personal history of nicotine dependence: Secondary | ICD-10-CM | POA: Diagnosis not present

## 2024-06-01 DIAGNOSIS — J219 Acute bronchiolitis, unspecified: Secondary | ICD-10-CM | POA: Diagnosis not present

## 2024-06-01 DIAGNOSIS — K219 Gastro-esophageal reflux disease without esophagitis: Secondary | ICD-10-CM | POA: Diagnosis not present

## 2024-06-01 DIAGNOSIS — J449 Chronic obstructive pulmonary disease, unspecified: Secondary | ICD-10-CM | POA: Diagnosis not present

## 2024-06-01 DIAGNOSIS — Z791 Long term (current) use of non-steroidal anti-inflammatories (NSAID): Secondary | ICD-10-CM | POA: Diagnosis not present

## 2024-06-01 DIAGNOSIS — L409 Psoriasis, unspecified: Secondary | ICD-10-CM | POA: Diagnosis not present

## 2024-06-01 DIAGNOSIS — Z8249 Family history of ischemic heart disease and other diseases of the circulatory system: Secondary | ICD-10-CM | POA: Diagnosis not present

## 2024-06-01 DIAGNOSIS — Z833 Family history of diabetes mellitus: Secondary | ICD-10-CM | POA: Diagnosis not present

## 2024-06-01 DIAGNOSIS — I10 Essential (primary) hypertension: Secondary | ICD-10-CM | POA: Diagnosis not present

## 2024-06-01 DIAGNOSIS — J301 Allergic rhinitis due to pollen: Secondary | ICD-10-CM | POA: Diagnosis not present

## 2024-06-01 DIAGNOSIS — M1990 Unspecified osteoarthritis, unspecified site: Secondary | ICD-10-CM | POA: Diagnosis not present

## 2024-06-01 DIAGNOSIS — Z79899 Other long term (current) drug therapy: Secondary | ICD-10-CM | POA: Diagnosis not present

## 2024-06-01 NOTE — Anesthesia Preprocedure Evaluation (Signed)
 Anesthesia Evaluation    Airway        Dental   Pulmonary former smoker          Cardiovascular hypertension,      Neuro/Psych    GI/Hepatic   Endo/Other    Renal/GU      Musculoskeletal   Abdominal   Peds  Hematology   Anesthesia Other Findings   Reproductive/Obstetrics                              Anesthesia Physical Anesthesia Plan  ASA:   Anesthesia Plan:    Post-op Pain Management:    Induction:   PONV Risk Score and Plan:   Airway Management Planned:   Additional Equipment:   Intra-op Plan:   Post-operative Plan:   Informed Consent:   Plan Discussed with:   Anesthesia Plan Comments: (See PAT note 05/31/2024)        Anesthesia Quick Evaluation

## 2024-06-01 NOTE — Progress Notes (Addendum)
 Anesthesia Chart Review   Case: [id number] Date/Time: 06/04/24 0815   Procedure: ARTHROPLASTY, KNEE, TOTAL (Right: Knee)   Anesthesia type: Spinal   Diagnosis: Primary osteoarthritis of right knee [M17.11]   Pre-op diagnosis: osteoarthritis right knee   Location: [facility] ROOM 10 / [facility]   Surgeons: Vernetta Lonni GRADE, MD       DISCUSSION:78 y.o. former smoker with h/o COPD, chronic cough, HTN, GERD, lung abscess in 1996 status post RML/RLL resection, PVCs, right knee OA scheduled for above procedure 06/04/24 with Dr. Lonni Vernetta.   Pt seen by PCP 05/11/2024 for preoperative evaluation. Per OV note pt cleared medically.   Per cardiology preoperative evaluation 05/06/2024, Mr. Rumple perioperative risk of a major cardiac event is 0.4% according to the Revised Cardiac Risk Index (RCRI).  Therefore, he is at low risk for perioperative complications.   His functional capacity is good at 5.81 METs according to the Duke Activity Status Index (DASI). Recommendations: According to ACC/AHA guidelines, no further cardiovascular testing needed.  The patient may proceed to surgery at acceptable risk.   VS: BP (!) 144/90   Pulse 84   Temp 36.9 C (Oral)   Resp 16   Ht 5' 7 (1.702 m)   Wt 66.3 kg   SpO2 96%   BMI 22.90 kg/m   PROVIDERS: Domenica Harlene LABOR, MD is PCP   Cardiologist:  Maude Emmer, MD  LABS: Labs reviewed: Acceptable for surgery. (all labs ordered are listed, but only abnormal results are displayed)  Labs Reviewed  SURGICAL PCR SCREEN  CBC  BASIC METABOLIC PANEL WITH GFR  TYPE AND SCREEN     IMAGES:   EKG:   CV: Echo 02/20/2023  1. Left ventricular ejection fraction, by estimation, is 60 to 65%. The  left ventricle has normal function. The left ventricle has no regional  wall motion abnormalities. Left ventricular diastolic parameters are  consistent with Grade I diastolic  dysfunction (impaired relaxation).   2. Right ventricular systolic function is  normal. The right ventricular  size is normal.   3. The mitral valve is normal in structure. No evidence of mitral valve  regurgitation. No evidence of mitral stenosis.   4. The aortic valve is normal in structure. Aortic valve regurgitation is  not visualized. No aortic stenosis is present.   5. The inferior vena cava is normal in size with greater than 50%  respiratory variability, suggesting right atrial pressure of 3 mmHg.   Comparison(s): Echocardiogram done 09/29/19 showed an EF of 50-55%.  Past Medical History:  Diagnosis Date   Allergic rhinitis    Atypical chest pain 12/01/2014   Back pain 01/12/2013   COPD (chronic obstructive pulmonary disease) (HCC)    FeV1 64%-2007   Dyspnea    Dysrhythmia    PVCs   Emphysema    GERD (gastroesophageal reflux disease)    H/O total shoulder replacement, left 2024   H/O total shoulder replacement, right 2023   History of lung abscess    bronchiectasis with RMLandRLL ersection -1996- Dr Brantley   Hordeolum externum (stye) 04/23/2015   Right eye   Hypertension    Impaired vision    glasses   Osteoarthritis    hands and knees   Pneumonia    Sinusitis, acute 04/23/2015   Skin cancer    Right eye area    Past Surgical History:  Procedure Laterality Date   COLONOSCOPY  11/20/2010   Dr.Stark   ESOPHAGOGASTRODUODENOSCOPY (EGD) WITH PROPOFOL  N/A 10/31/2021   Procedure:  ESOPHAGOGASTRODUODENOSCOPY (EGD) WITH PROPOFOL ;  Surgeon: Avram Lupita BRAVO, MD;  Location: [facility] ENDOSCOPY;  Service: Gastroenterology;  Laterality: N/A;   HEMOSTASIS CONTROL  10/31/2021   Procedure: HEMOSTASIS CONTROL;  Surgeon: Avram Lupita BRAVO, MD;  Location: [facility] ENDOSCOPY;  Service: Gastroenterology;;   HERNIA REPAIR  10/03/2009   HOT HEMOSTASIS N/A 10/31/2021   Procedure: HOT HEMOSTASIS (ARGON PLASMA COAGULATION/BICAP);  Surgeon: Avram Lupita BRAVO, MD;  Location: [facility] ENDOSCOPY;  Service: Gastroenterology;  Laterality: N/A;   left knee     lower back surgery  09/02/2008    06/2009   LUNG SURGERY     RML andRUL removed due to bleeding and bronchiectasis and lung abcess   MOUTH SURGERY     teeth extraction   NECK SURGERY  05/03/2008   REVERSE SHOULDER ARTHROPLASTY Right 08/22/2022   Procedure: RIGHT REVERSE SHOULDER ARTHROPLASTY;  Surgeon: Genelle Standing, MD;  Location: [facility] OR;  Service: Orthopedics;  Laterality: Right;   REVERSE SHOULDER ARTHROPLASTY Left 08/05/2023   Procedure: LEFT REVERSE SHOULDER ARTHROPLASTY;  Surgeon: Genelle Standing, MD;  Location: [facility];  Service: Orthopedics;  Laterality: Left;   right foot surgery     SCLEROTHERAPY  10/31/2021   Procedure: SCLEROTHERAPY;  Surgeon: Avram Lupita BRAVO, MD;  Location: [facility] ENDOSCOPY;  Service: Gastroenterology;;   TONSILLECTOMY     removed as a child   VIDEO BRONCHOSCOPY N/A 07/12/2020   Procedure: VIDEO BRONCHOSCOPY;  Surgeon: Kerrin Standing BROCKS, MD;  Location: [facility] OR;  Service: Thoracic;  Laterality: N/A;   VIDEO BRONCHOSCOPY WITH ENDOBRONCHIAL NAVIGATION N/A 07/12/2020   Procedure: VIDEO BRONCHOSCOPY WITH ENDOBRONCHIAL NAVIGATION;  Surgeon: Kerrin Standing BROCKS, MD;  Location: [facility] OR;  Service: Thoracic;  Laterality: N/A;    MEDICATIONS:  acetaminophen  (TYLENOL ) 500 MG tablet   albuterol  (VENTOLIN  HFA) 108 (90 Base) MCG/ACT inhaler   amLODipine  (NORVASC ) 5 MG tablet   benzonatate  (TESSALON ) 100 MG capsule   CALCIUM CARBONATE ANTACID PO   cetirizine (ZYRTEC) 10 MG tablet   fluticasone  (FLONASE ) 50 MCG/ACT nasal spray   GLUCOSAMINE-CHONDROITIN PO   guaiFENesin  (MUCINEX ) 600 MG 12 hr tablet   ipratropium (ATROVENT ) 0.06 % nasal spray   loratadine (CLARITIN) 10 MG tablet   montelukast  (SINGULAIR ) 10 MG tablet   Multiple Vitamin (MULTIVITAMIN WITH MINERALS) TABS tablet   pantoprazole  (PROTONIX ) 20 MG tablet   traMADol  (ULTRAM ) 50 MG tablet   TRELEGY ELLIPTA  100-62.5-25 MCG/ACT AEPB   No current facility-administered medications for this encounter.    Harlene Hoots Ward,  PA-C [facility] Pre-Surgical Testing [phone]

## 2024-06-03 ENCOUNTER — Telehealth: Payer: Self-pay | Admitting: *Deleted

## 2024-06-03 NOTE — H&P (Signed)
 TOTAL KNEE ADMISSION H&P  Patient is being admitted for right total knee arthroplasty.  Subjective:  Chief Complaint:right knee pain.  HPI: [name], 78 y.o. male, has a history of pain and functional disability in the right knee due to arthritis and has failed non-surgical conservative treatments for greater than 12 weeks to includeNSAID's and/or analgesics, corticosteriod injections, viscosupplementation injections, flexibility and strengthening excercises, use of assistive devices, and activity modification.  Onset of symptoms was gradual, starting several years ago with gradually worsening course since that time. The patient noted no past surgery on the right knee(s).  Patient currently rates pain in the right knee(s) at 10 out of 10 with activity. Patient has night pain, worsening of pain with activity and weight bearing, pain that interferes with activities of daily living, pain with passive range of motion, crepitus, and joint swelling.  Patient has evidence of subchondral sclerosis, periarticular osteophytes, and joint space narrowing by imaging studies. There is no active infection.  Patient Active Problem List   Diagnosis Date Noted   Pre-operative clearance 05/11/2024   Unilateral primary osteoarthritis, right knee 04/14/2024   Left rotator cuff tear arthropathy 08/05/2023   Mitral valve disease 01/21/2023   Pedal edema 01/21/2023   Rotator cuff arthropathy of right shoulder 08/22/2022   Gastric ulcer 03/14/2022   Bradycardia 03/14/2022   Chronic pain syndrome 11/06/2021   Hypokalemia 11/03/2021   Protein-calorie malnutrition, severe 11/02/2021   Acute duodenal ulcer with hemorrhage    Acute upper GI bleed 10/30/2021   Anemia due to acute blood loss 10/30/2021   DDD (degenerative disc disease), lumbar 10/30/2021   Osteoarthritis 10/30/2021   Sedimentation rate elevation 10/16/2021   CRP elevated 10/16/2021   Hemoptysis 06/26/2020   Hyperlipidemia 12/23/2019    Recurrent sinusitis 09/28/2018   High risk medication use 09/28/2018   Solitary pulmonary nodule on lung CT 07/06/2018   Unilateral primary osteoarthritis, left knee 12/18/2017   Anemia 09/15/2017   Left knee pain 09/15/2017   Chronic arthralgias of knees and hips 09/15/2017   Bilateral shoulder pain 09/15/2017   Nocturia 09/15/2017   Hyperglycemia 09/15/2017   Skin lesion 09/15/2017   H/O tobacco use, presenting hazards to health 09/15/2017   Hx of migraines 09/28/2016   COPD exacerbation (HCC) 04/24/2015   Acute sinusitis 04/23/2015   Atypical chest pain 12/01/2014   Screening for ischemic heart disease 07/25/2014   Prostate cancer screening 07/25/2014   Preventative health care 07/25/2014   Asymptomatic PVCs 07/25/2014   Arthritis 10/18/2013   Incomplete RBBB 07/19/2013   Back pain 01/12/2013   Annual physical exam 07/21/2011   Carotid bruit 07/21/2011   Benign essential HTN 05/23/2010   Allergic rhinitis 07/07/2007   COPD GOLD II D 07/07/2007   GERD 07/07/2007   Past Medical History:  Diagnosis Date   Allergic rhinitis    Atypical chest pain 12/01/2014   Back pain 01/12/2013   COPD (chronic obstructive pulmonary disease) (HCC)    FeV1 64%-2007   Dyspnea    Dysrhythmia    PVCs   Emphysema    GERD (gastroesophageal reflux disease)    H/O total shoulder replacement, left 2024   H/O total shoulder replacement, right 2023   History of lung abscess    bronchiectasis with RMLandRLL ersection -1996- Dr Brantley   Hordeolum externum (stye) 04/23/2015   Right eye   Hypertension    Impaired vision    glasses   Osteoarthritis    hands and knees   Pneumonia  Sinusitis, acute 04/23/2015   Skin cancer    Right eye area    Past Surgical History:  Procedure Laterality Date   COLONOSCOPY  11/20/2010   Dr.Stark   ESOPHAGOGASTRODUODENOSCOPY (EGD) WITH PROPOFOL  N/A 10/31/2021   Procedure: ESOPHAGOGASTRODUODENOSCOPY (EGD) WITH PROPOFOL ;  Surgeon: Avram Lupita BRAVO, MD;   Location: [facility] ENDOSCOPY;  Service: Gastroenterology;  Laterality: N/A;   HEMOSTASIS CONTROL  10/31/2021   Procedure: HEMOSTASIS CONTROL;  Surgeon: Avram Lupita BRAVO, MD;  Location: [facility] ENDOSCOPY;  Service: Gastroenterology;;   HERNIA REPAIR  10/03/2009   HOT HEMOSTASIS N/A 10/31/2021   Procedure: HOT HEMOSTASIS (ARGON PLASMA COAGULATION/BICAP);  Surgeon: Avram Lupita BRAVO, MD;  Location: [facility] ENDOSCOPY;  Service: Gastroenterology;  Laterality: N/A;   left knee     lower back surgery  09/02/2008   06/2009   LUNG SURGERY     RML andRUL removed due to bleeding and bronchiectasis and lung abcess   MOUTH SURGERY     teeth extraction   NECK SURGERY  05/03/2008   REVERSE SHOULDER ARTHROPLASTY Right 08/22/2022   Procedure: RIGHT REVERSE SHOULDER ARTHROPLASTY;  Surgeon: Genelle Standing, MD;  Location: [facility] OR;  Service: Orthopedics;  Laterality: Right;   REVERSE SHOULDER ARTHROPLASTY Left 08/05/2023   Procedure: LEFT REVERSE SHOULDER ARTHROPLASTY;  Surgeon: Genelle Standing, MD;  Location: [facility];  Service: Orthopedics;  Laterality: Left;   right foot surgery     SCLEROTHERAPY  10/31/2021   Procedure: SCLEROTHERAPY;  Surgeon: Avram Lupita BRAVO, MD;  Location: [facility] ENDOSCOPY;  Service: Gastroenterology;;   TONSILLECTOMY     removed as a child   VIDEO BRONCHOSCOPY N/A 07/12/2020   Procedure: VIDEO BRONCHOSCOPY;  Surgeon: Kerrin Standing BROCKS, MD;  Location: [facility] OR;  Service: Thoracic;  Laterality: N/A;   VIDEO BRONCHOSCOPY WITH ENDOBRONCHIAL NAVIGATION N/A 07/12/2020   Procedure: VIDEO BRONCHOSCOPY WITH ENDOBRONCHIAL NAVIGATION;  Surgeon: Kerrin Standing BROCKS, MD;  Location: [facility] OR;  Service: Thoracic;  Laterality: N/A;    No current facility-administered medications for this encounter.   Current Outpatient Medications  Medication Sig Dispense Refill Last Dose/Taking   acetaminophen  (TYLENOL ) 500 MG tablet Take 1,000 mg by mouth in the morning and at bedtime.   Taking   albuterol  (VENTOLIN   HFA) 108 (90 Base) MCG/ACT inhaler INHALE 2 PUFFS INTO THE LUNGS EVERY 6 HOURS AS NEEDED FOR WHEEZING OR SHORTNESS OF BREATH. 2 each 10 Taking As Needed   amLODipine  (NORVASC ) 5 MG tablet Take 1 tablet (5 mg total) by mouth daily. 90 tablet 1 Taking   benzonatate  (TESSALON ) 100 MG capsule TAKE 1 CAPSULE BY MOUTH TWICE DAILY AS NEEDED FOR COUGH 90 capsule 0 Taking   CALCIUM CARBONATE ANTACID PO Take 1,000 mg by mouth in the morning.   Taking   cetirizine (ZYRTEC) 10 MG tablet Take 10 mg by mouth in the morning.   Taking   fluticasone  (FLONASE ) 50 MCG/ACT nasal spray USE 2 SPRAYS IN EACH NOSTRIL EVERY DAY 48 g 3 Taking   GLUCOSAMINE-CHONDROITIN PO Take 1 tablet by mouth 2 (two) times daily.   Taking   guaiFENesin  (MUCINEX ) 600 MG 12 hr tablet Take 600 mg by mouth 2 (two) times daily.   Taking   ipratropium (ATROVENT ) 0.06 % nasal spray Place 1 spray into both nostrils 4 (four) times daily. 15 mL 12 Taking   loratadine (CLARITIN) 10 MG tablet Take 10 mg by mouth every evening.   Taking   montelukast  (SINGULAIR ) 10 MG tablet TAKE 1 TABLET EVERY DAY 90 tablet 3 Taking  Multiple Vitamin (MULTIVITAMIN WITH MINERALS) TABS tablet Take 1 tablet by mouth in the morning.   Taking   pantoprazole  (PROTONIX ) 20 MG tablet Take 1 tablet by mouth once daily (Patient taking differently: Take 20 mg by mouth every evening.) 90 tablet 3 Taking Differently   traMADol  (ULTRAM ) 50 MG tablet TAKE 1 TABLET BY MOUTH EVERY 12 HOURS AS NEEDED (Patient taking differently: Take 50 mg by mouth every 12 (twelve) hours.) 60 tablet 0 Taking Differently   TRELEGY ELLIPTA  100-62.5-25 MCG/ACT AEPB INHALE 1 PUFF INTO THE LUNGS DAILY 60 each 11 Taking   Allergies  Allergen Reactions   Aspirin      Stomach bleeding    Nsaids     Do not give to patient due to hx of stomach bleeds    Losartan  Other (See Comments)    weakness   Meloxicam      Caused Stomach Bleeds     Social History   Tobacco Use   Smoking status: Former     Current packs/day: 0.00    Average packs/day: 1.5 packs/day for 38.0 years (57.0 ttl pk-yrs)    Types: Cigarettes    Start date: 09/03/1962    Quit date: 09/03/2000    Years since quitting: 23.7    Passive exposure: Past   Smokeless tobacco: Never  Substance Use Topics   Alcohol use: Yes    Alcohol/week: 1.0 standard drink of alcohol    Types: 1 Cans of beer per week    Comment: 3 or 4 nights weekly    Family History  Problem Relation Age of Onset   Heart failure Mother        age 50   COPD Mother    Prostate cancer Father        father died prostate ca   Obstructive Sleep Apnea Brother    Obesity Brother    Colon cancer Paternal Grandmother    Healthy Son    Coronary artery disease Other        1st degree relative<60   Stroke Other        1st degree relative<50   Esophageal cancer Neg Hx    Stomach cancer Neg Hx      Review of Systems  Objective:  Physical Exam Vitals reviewed.  Constitutional:      Appearance: Normal appearance. He is normal weight.  HENT:     Head: Normocephalic and atraumatic.  Eyes:     Extraocular Movements: Extraocular movements intact.     Pupils: Pupils are equal, round, and reactive to light.  Cardiovascular:     Rate and Rhythm: Normal rate.  Pulmonary:     Effort: Pulmonary effort is normal.  Abdominal:     Palpations: Abdomen is soft.  Musculoskeletal:     Cervical back: Normal range of motion and neck supple.     Right knee: Effusion, bony tenderness and crepitus present. Decreased range of motion. Tenderness present over the medial joint line and lateral joint line. Abnormal alignment and abnormal meniscus.  Neurological:     Mental Status: He is alert and oriented to person, place, and time.  Psychiatric:        Behavior: Behavior normal.     Vital signs in last 24 hours:    Labs:   Estimated body mass index is 22.9 kg/m as calculated from the following:   Height as of 05/31/24: 5' 7 (1.702 m).   Weight as of 05/31/24:  66.3 kg.   Imaging Review Plain radiographs demonstrate  severe degenerative joint disease of the right knee(s). The overall alignment ismild valgus. The bone quality appears to be good for age and reported activity level.      Assessment/Plan:  End stage arthritis, right knee   The patient history, physical examination, clinical judgment of the provider and imaging studies are consistent with end stage degenerative joint disease of the right knee(s) and total knee arthroplasty is deemed medically necessary. The treatment options including medical management, injection therapy arthroscopy and arthroplasty were discussed at length. The risks and benefits of total knee arthroplasty were presented and reviewed. The risks due to aseptic loosening, infection, stiffness, patella tracking problems, thromboembolic complications and other imponderables were discussed. The patient acknowledged the explanation, agreed to proceed with the plan and consent was signed. Patient is being admitted for inpatient treatment for surgery, pain control, PT, OT, prophylactic antibiotics, VTE prophylaxis, progressive ambulation and ADL's and discharge planning. The patient is planning to be discharged home with home health services

## 2024-06-03 NOTE — Care Plan (Signed)
 [facility] RNCM call to patient to discuss his upcoming Right total knee arthroplasty with Dr. Vernetta on 06/04/24 at [facility]. He is agreeable to case management. He lives with his spouse, who will be able to assist him at home after discharge. He will need a RW. Sent referral to [facility]. Anticipate HHPT will be needed after short hospital stay. Referral made to [facility] after choice provided. Reviewed post op care instructions and questions answered. Will continue to follow for needs.

## 2024-06-03 NOTE — Telephone Encounter (Signed)
 [facility] pre-op call completed.

## 2024-06-04 ENCOUNTER — Ambulatory Visit (HOSPITAL_COMMUNITY): Payer: Self-pay | Admitting: Certified Registered Nurse Anesthetist

## 2024-06-04 ENCOUNTER — Encounter (HOSPITAL_COMMUNITY)
Admission: RE | Disposition: A | Discharge status: Home / Self Care | Payer: Self-pay | Source: Home / Self Care | Attending: Orthopaedic Surgery

## 2024-06-04 ENCOUNTER — Encounter (HOSPITAL_COMMUNITY): Payer: Self-pay | Admitting: Orthopaedic Surgery

## 2024-06-04 ENCOUNTER — Other Ambulatory Visit: Payer: Self-pay

## 2024-06-04 ENCOUNTER — Ambulatory Visit (HOSPITAL_COMMUNITY): Payer: Self-pay | Admitting: Physician Assistant

## 2024-06-04 ENCOUNTER — Observation Stay (HOSPITAL_COMMUNITY)

## 2024-06-04 ENCOUNTER — Inpatient Hospital Stay (HOSPITAL_COMMUNITY)
Admission: RE | Admit: 2024-06-04 | Discharge: 2024-06-07 | DRG: 470 | Disposition: A | Discharge status: Home Health | Attending: Orthopaedic Surgery | Admitting: Orthopaedic Surgery

## 2024-06-04 DIAGNOSIS — Z973 Presence of spectacles and contact lenses: Secondary | ICD-10-CM

## 2024-06-04 DIAGNOSIS — Z8249 Family history of ischemic heart disease and other diseases of the circulatory system: Secondary | ICD-10-CM

## 2024-06-04 DIAGNOSIS — G894 Chronic pain syndrome: Secondary | ICD-10-CM | POA: Diagnosis present

## 2024-06-04 DIAGNOSIS — J309 Allergic rhinitis, unspecified: Secondary | ICD-10-CM | POA: Diagnosis present

## 2024-06-04 DIAGNOSIS — I4510 Unspecified right bundle-branch block: Secondary | ICD-10-CM | POA: Diagnosis present

## 2024-06-04 DIAGNOSIS — Z96651 Presence of right artificial knee joint: Secondary | ICD-10-CM | POA: Diagnosis not present

## 2024-06-04 DIAGNOSIS — Z96611 Presence of right artificial shoulder joint: Secondary | ICD-10-CM | POA: Diagnosis present

## 2024-06-04 DIAGNOSIS — Z471 Aftercare following joint replacement surgery: Secondary | ICD-10-CM | POA: Diagnosis not present

## 2024-06-04 DIAGNOSIS — I10 Essential (primary) hypertension: Secondary | ICD-10-CM

## 2024-06-04 DIAGNOSIS — Z7951 Long term (current) use of inhaled steroids: Secondary | ICD-10-CM

## 2024-06-04 DIAGNOSIS — K219 Gastro-esophageal reflux disease without esophagitis: Secondary | ICD-10-CM | POA: Diagnosis present

## 2024-06-04 DIAGNOSIS — J439 Emphysema, unspecified: Secondary | ICD-10-CM | POA: Diagnosis present

## 2024-06-04 DIAGNOSIS — M1711 Unilateral primary osteoarthritis, right knee: Principal | ICD-10-CM | POA: Diagnosis present

## 2024-06-04 DIAGNOSIS — Z96612 Presence of left artificial shoulder joint: Secondary | ICD-10-CM | POA: Diagnosis present

## 2024-06-04 DIAGNOSIS — Z8711 Personal history of peptic ulcer disease: Secondary | ICD-10-CM

## 2024-06-04 DIAGNOSIS — Z79899 Other long term (current) drug therapy: Secondary | ICD-10-CM

## 2024-06-04 DIAGNOSIS — Z87891 Personal history of nicotine dependence: Secondary | ICD-10-CM

## 2024-06-04 DIAGNOSIS — Z85828 Personal history of other malignant neoplasm of skin: Secondary | ICD-10-CM

## 2024-06-04 DIAGNOSIS — Z634 Disappearance and death of family member: Secondary | ICD-10-CM

## 2024-06-04 DIAGNOSIS — M170 Bilateral primary osteoarthritis of knee: Secondary | ICD-10-CM | POA: Diagnosis present

## 2024-06-04 DIAGNOSIS — G8918 Other acute postprocedural pain: Secondary | ICD-10-CM | POA: Diagnosis not present

## 2024-06-04 DIAGNOSIS — E785 Hyperlipidemia, unspecified: Secondary | ICD-10-CM | POA: Diagnosis present

## 2024-06-04 DIAGNOSIS — R609 Edema, unspecified: Secondary | ICD-10-CM | POA: Diagnosis not present

## 2024-06-04 DIAGNOSIS — Z8701 Personal history of pneumonia (recurrent): Secondary | ICD-10-CM

## 2024-06-04 DIAGNOSIS — J441 Chronic obstructive pulmonary disease with (acute) exacerbation: Secondary | ICD-10-CM

## 2024-06-04 DIAGNOSIS — M25569 Pain in unspecified knee: Secondary | ICD-10-CM | POA: Diagnosis present

## 2024-06-04 DIAGNOSIS — H547 Unspecified visual loss: Secondary | ICD-10-CM | POA: Diagnosis present

## 2024-06-04 HISTORY — PX: TOTAL KNEE ARTHROPLASTY: SHX125

## 2024-06-04 LAB — TYPE AND SCREEN
ABO/RH(D): O POS
Antibody Screen: NEGATIVE

## 2024-06-04 SURGERY — ARTHROPLASTY, KNEE, TOTAL
Anesthesia: Spinal | Site: Knee | Laterality: Right

## 2024-06-04 MED ORDER — AMLODIPINE BESYLATE 5 MG PO TABS
5.0000 mg | ORAL_TABLET | Freq: Every day | ORAL | Status: DC
  Administered 2024-06-05 – 2024-06-07 (×3): 5 mg via ORAL
  Filled 2024-06-04 (×3): qty 1

## 2024-06-04 MED ORDER — ACETAMINOPHEN 325 MG PO TABS
325.0000 mg | ORAL_TABLET | Freq: Four times a day (QID) | ORAL | Status: DC | PRN
  Administered 2024-06-07: 650 mg via ORAL
  Filled 2024-06-04: qty 2

## 2024-06-04 MED ORDER — ACETAMINOPHEN 10 MG/ML IV SOLN
1000.0000 mg | Freq: Once | INTRAVENOUS | Status: DC | PRN
  Administered 2024-06-04: 1000 mg via INTRAVENOUS

## 2024-06-04 MED ORDER — ALBUTEROL SULFATE HFA 108 (90 BASE) MCG/ACT IN AERS: 2.0000 | INHALATION_SPRAY | Freq: Four times a day (QID) | RESPIRATORY_TRACT | Status: DC | PRN

## 2024-06-04 MED ORDER — OXYCODONE HCL 5 MG PO TABS
10.0000 mg | ORAL_TABLET | ORAL | Status: DC | PRN
  Administered 2024-06-04 – 2024-06-06 (×4): 10 mg via ORAL
  Administered 2024-06-06 – 2024-06-07 (×3): 15 mg via ORAL
  Filled 2024-06-04: qty 3
  Filled 2024-06-04 (×2): qty 2
  Filled 2024-06-04 (×2): qty 3

## 2024-06-04 MED ORDER — METHOCARBAMOL 1000 MG/10ML IJ SOLN
INTRAMUSCULAR | Status: AC
Start: 2024-06-04 — End: 2024-06-04
  Filled 2024-06-04: qty 10

## 2024-06-04 MED ORDER — BUPIVACAINE IN DEXTROSE 0.75-8.25 % IT SOLN
INTRATHECAL | Status: DC | PRN
Start: 2024-06-04 — End: 2024-06-04
  Administered 2024-06-04: 1.8 mL via INTRATHECAL

## 2024-06-04 MED ORDER — OXYCODONE HCL 5 MG PO TABS
5.0000 mg | ORAL_TABLET | ORAL | Status: DC | PRN
  Administered 2024-06-04: 5 mg via ORAL
  Administered 2024-06-05 – 2024-06-07 (×3): 10 mg via ORAL
  Filled 2024-06-04 (×6): qty 2

## 2024-06-04 MED ORDER — ONDANSETRON HCL 4 MG/2ML IJ SOLN: 4.0000 mg | Freq: Once | INTRAMUSCULAR | Status: DC | PRN

## 2024-06-04 MED ORDER — PROPOFOL 10 MG/ML IV BOLUS
INTRAVENOUS | Status: DC | PRN
  Administered 2024-06-04: 20 mg via INTRAVENOUS
  Administered 2024-06-04: 100 mg via INTRAVENOUS

## 2024-06-04 MED ORDER — HYDROMORPHONE HCL 1 MG/ML IJ SOLN
INTRAMUSCULAR | Status: AC
  Filled 2024-06-04: qty 1

## 2024-06-04 MED ORDER — BUPIVACAINE-EPINEPHRINE 0.25% -1:200000 IJ SOLN
INTRAMUSCULAR | Status: DC | PRN
  Administered 2024-06-04: 30 mL

## 2024-06-04 MED ORDER — PANTOPRAZOLE SODIUM 20 MG PO TBEC
20.0000 mg | DELAYED_RELEASE_TABLET | Freq: Every evening | ORAL | Status: DC
  Administered 2024-06-04 – 2024-06-06 (×3): 20 mg via ORAL
  Filled 2024-06-04 (×3): qty 1

## 2024-06-04 MED ORDER — ALUM & MAG HYDROXIDE-SIMETH 200-200-20 MG/5ML PO SUSP: 30.0000 mL | ORAL | Status: DC | PRN

## 2024-06-04 MED ORDER — PROPOFOL 500 MG/50ML IV EMUL
INTRAVENOUS | Status: DC | PRN
  Administered 2024-06-04: 100 ug/kg/min via INTRAVENOUS

## 2024-06-04 MED ORDER — LACTATED RINGERS IV SOLN
INTRAVENOUS | Status: DC
Start: 2024-06-04 — End: 2024-06-04

## 2024-06-04 MED ORDER — BUPIVACAINE-EPINEPHRINE (PF) 0.25% -1:200000 IJ SOLN
INTRAMUSCULAR | Status: AC
  Filled 2024-06-04: qty 30

## 2024-06-04 MED ORDER — CEFAZOLIN SODIUM-DEXTROSE 2-4 GM/100ML-% IV SOLN
2.0000 g | Freq: Four times a day (QID) | INTRAVENOUS | Status: AC
  Administered 2024-06-04 (×2): 2 g via INTRAVENOUS
  Filled 2024-06-04 (×2): qty 100

## 2024-06-04 MED ORDER — GUAIFENESIN ER 600 MG PO TB12
600.0000 mg | ORAL_TABLET | Freq: Two times a day (BID) | ORAL | Status: DC
  Administered 2024-06-04 – 2024-06-07 (×6): 600 mg via ORAL
  Filled 2024-06-04 (×6): qty 1

## 2024-06-04 MED ORDER — FENTANYL CITRATE (PF) 100 MCG/2ML IJ SOLN
INTRAMUSCULAR | Status: AC
  Filled 2024-06-04: qty 2

## 2024-06-04 MED ORDER — ONDANSETRON HCL 4 MG/2ML IJ SOLN
INTRAMUSCULAR | Status: AC
  Filled 2024-06-04: qty 2

## 2024-06-04 MED ORDER — MIDAZOLAM HCL 2 MG/2ML IJ SOLN
1.0000 mg | INTRAMUSCULAR | Status: DC
  Administered 2024-06-04: 1 mg via INTRAVENOUS
  Filled 2024-06-04: qty 2

## 2024-06-04 MED ORDER — 0.9 % SODIUM CHLORIDE (POUR BTL) OPTIME
TOPICAL | Status: DC | PRN
  Administered 2024-06-04: 1000 mL

## 2024-06-04 MED ORDER — ALBUTEROL SULFATE (2.5 MG/3ML) 0.083% IN NEBU: 2.5000 mg | INHALATION_SOLUTION | Freq: Four times a day (QID) | RESPIRATORY_TRACT | Status: DC | PRN

## 2024-06-04 MED ORDER — DOCUSATE SODIUM 100 MG PO CAPS
100.0000 mg | ORAL_CAPSULE | Freq: Two times a day (BID) | ORAL | Status: DC
  Administered 2024-06-04 – 2024-06-07 (×6): 100 mg via ORAL
  Filled 2024-06-04 (×6): qty 1

## 2024-06-04 MED ORDER — MENTHOL 3 MG MT LOZG: 1.0000 | LOZENGE | OROMUCOSAL | Status: DC | PRN

## 2024-06-04 MED ORDER — ONDANSETRON HCL 4 MG PO TABS
4.0000 mg | ORAL_TABLET | Freq: Four times a day (QID) | ORAL | Status: DC | PRN
  Administered 2024-06-05: 4 mg via ORAL
  Filled 2024-06-04: qty 1

## 2024-06-04 MED ORDER — MONTELUKAST SODIUM 10 MG PO TABS
10.0000 mg | ORAL_TABLET | Freq: Every day | ORAL | Status: DC
  Administered 2024-06-05 – 2024-06-07 (×3): 10 mg via ORAL
  Filled 2024-06-04 (×3): qty 1

## 2024-06-04 MED ORDER — OXYCODONE HCL 5 MG PO TABS
ORAL_TABLET | ORAL | Status: AC
  Filled 2024-06-04: qty 1

## 2024-06-04 MED ORDER — OXYCODONE HCL 5 MG PO TABS
5.0000 mg | ORAL_TABLET | Freq: Once | ORAL | Status: AC | PRN
  Administered 2024-06-04: 5 mg via ORAL

## 2024-06-04 MED ORDER — CEFAZOLIN SODIUM-DEXTROSE 2-4 GM/100ML-% IV SOLN
2.0000 g | INTRAVENOUS | Status: AC
  Administered 2024-06-04: 2 g via INTRAVENOUS
  Filled 2024-06-04: qty 100

## 2024-06-04 MED ORDER — OXYCODONE HCL 5 MG/5ML PO SOLN: 5.0000 mg | Freq: Once | ORAL | Status: AC | PRN

## 2024-06-04 MED ORDER — FENTANYL CITRATE PF 50 MCG/ML IJ SOSY
PREFILLED_SYRINGE | INTRAMUSCULAR | Status: AC
  Filled 2024-06-04: qty 1

## 2024-06-04 MED ORDER — SODIUM CHLORIDE 0.9 % IV SOLN: INTRAVENOUS | Status: AC

## 2024-06-04 MED ORDER — ONDANSETRON HCL 4 MG/2ML IJ SOLN
INTRAMUSCULAR | Status: DC | PRN
  Administered 2024-06-04: 4 mg via INTRAVENOUS

## 2024-06-04 MED ORDER — POVIDONE-IODINE 10 % EX SWAB: 2.0000 | Freq: Once | CUTANEOUS | Status: DC

## 2024-06-04 MED ORDER — APIXABAN 2.5 MG PO TABS
2.5000 mg | ORAL_TABLET | Freq: Two times a day (BID) | ORAL | Status: DC
  Administered 2024-06-05 – 2024-06-07 (×5): 2.5 mg via ORAL
  Filled 2024-06-04 (×5): qty 1

## 2024-06-04 MED ORDER — ORAL CARE MOUTH RINSE: 15.0000 mL | Freq: Once | OROMUCOSAL | Status: AC

## 2024-06-04 MED ORDER — METHOCARBAMOL 1000 MG/10ML IJ SOLN
500.0000 mg | Freq: Four times a day (QID) | INTRAMUSCULAR | Status: DC | PRN
  Administered 2024-06-04: 500 mg via INTRAVENOUS

## 2024-06-04 MED ORDER — METHOCARBAMOL 500 MG PO TABS
500.0000 mg | ORAL_TABLET | Freq: Four times a day (QID) | ORAL | Status: DC | PRN
  Administered 2024-06-04 – 2024-06-07 (×4): 500 mg via ORAL
  Filled 2024-06-04 (×5): qty 1

## 2024-06-04 MED ORDER — ONDANSETRON HCL 4 MG/2ML IJ SOLN: 4.0000 mg | Freq: Four times a day (QID) | INTRAMUSCULAR | Status: DC | PRN

## 2024-06-04 MED ORDER — FENTANYL CITRATE PF 50 MCG/ML IJ SOSY
25.0000 ug | PREFILLED_SYRINGE | INTRAMUSCULAR | Status: DC | PRN
  Administered 2024-06-04 (×3): 50 ug via INTRAVENOUS

## 2024-06-04 MED ORDER — PHENOL 1.4 % MT LIQD: 1.0000 | OROMUCOSAL | Status: DC | PRN

## 2024-06-04 MED ORDER — HYDROMORPHONE HCL 1 MG/ML IJ SOLN
0.2500 mg | INTRAMUSCULAR | Status: DC | PRN
  Administered 2024-06-04 (×2): 0.25 mg via INTRAVENOUS

## 2024-06-04 MED ORDER — ACETAMINOPHEN 10 MG/ML IV SOLN
INTRAVENOUS | Status: AC
  Filled 2024-06-04: qty 100

## 2024-06-04 MED ORDER — METOCLOPRAMIDE HCL 5 MG/ML IJ SOLN: 5.0000 mg | Freq: Three times a day (TID) | INTRAMUSCULAR | Status: DC | PRN

## 2024-06-04 MED ORDER — IPRATROPIUM BROMIDE 0.06 % NA SOLN
1.0000 | Freq: Four times a day (QID) | NASAL | Status: DC
  Administered 2024-06-04 – 2024-06-06 (×8): 1 via NASAL
  Filled 2024-06-04 (×2): qty 15

## 2024-06-04 MED ORDER — SODIUM CHLORIDE 0.9 % IR SOLN
Status: DC | PRN
  Administered 2024-06-04: 1000 mL

## 2024-06-04 MED ORDER — HYDROMORPHONE HCL 1 MG/ML IJ SOLN
0.5000 mg | INTRAMUSCULAR | Status: DC | PRN
  Administered 2024-06-04: 0.5 mg via INTRAVENOUS
  Administered 2024-06-05 – 2024-06-06 (×2): 1 mg via INTRAVENOUS
  Filled 2024-06-04 (×3): qty 1

## 2024-06-04 MED ORDER — TRANEXAMIC ACID-NACL 1000-0.7 MG/100ML-% IV SOLN
1000.0000 mg | INTRAVENOUS | Status: AC
  Administered 2024-06-04: 1000 mg via INTRAVENOUS
  Filled 2024-06-04: qty 100

## 2024-06-04 MED ORDER — LORATADINE 10 MG PO TABS
10.0000 mg | ORAL_TABLET | Freq: Every evening | ORAL | Status: DC
  Administered 2024-06-04 – 2024-06-06 (×3): 10 mg via ORAL
  Filled 2024-06-04 (×3): qty 1

## 2024-06-04 MED ORDER — FENTANYL CITRATE (PF) 100 MCG/2ML IJ SOLN
INTRAMUSCULAR | Status: DC | PRN
  Administered 2024-06-04: 25 ug via INTRAVENOUS
  Administered 2024-06-04: 50 ug via INTRAVENOUS
  Administered 2024-06-04: 25 ug via INTRAVENOUS
  Administered 2024-06-04 (×2): 50 ug via INTRAVENOUS

## 2024-06-04 MED ORDER — METOCLOPRAMIDE HCL 5 MG PO TABS: 5.0000 mg | ORAL_TABLET | Freq: Three times a day (TID) | ORAL | Status: DC | PRN

## 2024-06-04 MED ORDER — DIPHENHYDRAMINE HCL 12.5 MG/5ML PO ELIX: 12.5000 mg | ORAL_SOLUTION | ORAL | Status: DC | PRN

## 2024-06-04 MED ORDER — ORAL CARE MOUTH RINSE: 15.0000 mL | OROMUCOSAL | Status: DC | PRN

## 2024-06-04 MED ORDER — CHLORHEXIDINE GLUCONATE 0.12 % MT SOLN
15.0000 mL | Freq: Once | OROMUCOSAL | Status: AC
  Administered 2024-06-04: 15 mL via OROMUCOSAL

## 2024-06-04 MED ORDER — PROPOFOL 1000 MG/100ML IV EMUL
INTRAVENOUS | Status: AC
  Filled 2024-06-04: qty 100

## 2024-06-04 MED ORDER — FENTANYL CITRATE PF 50 MCG/ML IJ SOSY
PREFILLED_SYRINGE | INTRAMUSCULAR | Status: AC
  Filled 2024-06-04: qty 2

## 2024-06-04 MED ORDER — FENTANYL CITRATE PF 50 MCG/ML IJ SOSY
50.0000 ug | PREFILLED_SYRINGE | INTRAMUSCULAR | Status: DC
  Administered 2024-06-04: 50 ug via INTRAVENOUS
  Filled 2024-06-04: qty 2

## 2024-06-04 SURGICAL SUPPLY — 50 items
BAG COUNTER SPONGE SURGICOUNT (BAG) IMPLANT
BAG ZIPLOCK 12X15 (MISCELLANEOUS) ×1 IMPLANT
BENZOIN TINCTURE PRP APPL 2/3 (GAUZE/BANDAGES/DRESSINGS) IMPLANT
BLADE SAG 18X100X1.27 (BLADE) ×1 IMPLANT
BNDG ELASTIC 6INX 5YD STR LF (GAUZE/BANDAGES/DRESSINGS) ×2 IMPLANT
BOWL SMART MIX CTS (DISPOSABLE) IMPLANT
CEMENT BONE R 1X40 (Cement) IMPLANT
COMPONENT FEM CMT PRNSA SZ10RT (Joint) IMPLANT
COMPONENT TIB CMT PS KN F 0DRT (Joint) IMPLANT
COOLER ICEMAN CLASSIC (MISCELLANEOUS) ×1 IMPLANT
COVER SURGICAL LIGHT HANDLE (MISCELLANEOUS) ×1 IMPLANT
CUFF TRNQT CYL 34X4.125X (TOURNIQUET CUFF) ×1 IMPLANT
DRAPE U-SHAPE 47X51 STRL (DRAPES) ×1 IMPLANT
DURAPREP 26ML APPLICATOR (WOUND CARE) ×1 IMPLANT
ELECT BLADE TIP CTD 4 INCH (ELECTRODE) ×1 IMPLANT
ELECT PENCIL ROCKER SW 15FT (MISCELLANEOUS) ×1 IMPLANT
ELECT REM PT RETURN 15FT ADLT (MISCELLANEOUS) ×1 IMPLANT
GAUZE PAD ABD 8X10 STRL (GAUZE/BANDAGES/DRESSINGS) ×2 IMPLANT
GAUZE SPONGE 4X4 12PLY STRL (GAUZE/BANDAGES/DRESSINGS) ×1 IMPLANT
GAUZE XEROFORM 1X8 LF (GAUZE/BANDAGES/DRESSINGS) IMPLANT
GLOVE BIO SURGEON STRL SZ7.5 (GLOVE) ×1 IMPLANT
GLOVE BIOGEL PI IND STRL 8 (GLOVE) ×2 IMPLANT
GLOVE ECLIPSE 8.0 STRL XLNG CF (GLOVE) ×1 IMPLANT
GOWN STRL REUS W/ TWL XL LVL3 (GOWN DISPOSABLE) ×2 IMPLANT
HOLDER FOLEY CATH W/STRAP (MISCELLANEOUS) IMPLANT
IMMOBILIZER KNEE 20 THIGH 36 (SOFTGOODS) ×1 IMPLANT
INSERT TIBIA KNEE RIGHT 10 (Joint) IMPLANT
KIT TURNOVER KIT A (KITS) ×1 IMPLANT
MANIFOLD NEPTUNE II (INSTRUMENTS) ×1 IMPLANT
NS IRRIG 1000ML POUR BTL (IV SOLUTION) ×1 IMPLANT
PACK TOTAL KNEE CUSTOM (KITS) ×1 IMPLANT
PAD COLD SHLDR WRAP-ON (PAD) ×1 IMPLANT
PADDING CAST COTTON 6X4 STRL (CAST SUPPLIES) ×2 IMPLANT
PIN DRILL HDLS TROCAR 75 4PK (PIN) IMPLANT
PROTECTOR NERVE ULNAR (MISCELLANEOUS) ×1 IMPLANT
SCREW FEMALE HEX FIX 25X2.5 (ORTHOPEDIC DISPOSABLE SUPPLIES) IMPLANT
SET HNDPC FAN SPRY TIP SCT (DISPOSABLE) ×1 IMPLANT
SET PAD KNEE POSITIONER (MISCELLANEOUS) ×1 IMPLANT
SPIKE FLUID TRANSFER (MISCELLANEOUS) IMPLANT
STAPLER SKIN PROX 35W (STAPLE) IMPLANT
STEM POLY PAT PLY 32M KNEE (Knees) IMPLANT
STRIP CLOSURE SKIN 1/2X4 (GAUZE/BANDAGES/DRESSINGS) IMPLANT
SUT MNCRL AB 4-0 PS2 18 (SUTURE) IMPLANT
SUT VIC AB 0 CT1 36 (SUTURE) ×1 IMPLANT
SUT VIC AB 1 CT1 36 (SUTURE) ×2 IMPLANT
SUT VIC AB 2-0 CT1 TAPERPNT 27 (SUTURE) ×2 IMPLANT
TOWEL GREEN STERILE FF (TOWEL DISPOSABLE) ×1 IMPLANT
TRAY FOLEY MTR SLVR 16FR STAT (SET/KITS/TRAYS/PACK) IMPLANT
WATER STERILE IRR 1000ML POUR (IV SOLUTION) ×2 IMPLANT
YANKAUER SUCT BULB TIP NO VENT (SUCTIONS) ×1 IMPLANT

## 2024-06-04 NOTE — Anesthesia Procedure Notes (Signed)
 Anesthesia Regional Block: Adductor canal block   Pre-Anesthetic Checklist: , timeout performed,  Correct Patient, Correct Site, Correct Laterality,  Correct Procedure, Correct Position, site marked,  Risks and benefits discussed,  Surgical consent,  Pre-op evaluation,  At surgeon's request and post-op pain management  Laterality: Right  Prep: Maximum Sterile Barrier Precautions used, chloraprep       Needles:  Injection technique: Single-shot  Needle Type: Echogenic Needle      Needle Gauge: 20     Additional Needles:   Procedures:,,,, ultrasound used (permanent image in chart),,    Narrative:  Start time: 06/04/2024 7:45 AM End time: 06/04/2024 7:48 AM Injection made incrementally with aspirations every 5 mL.  Performed by: Personally  Anesthesiologist: Keneth Lynwood POUR, MD

## 2024-06-04 NOTE — TOC Transition Note (Signed)
 Transition of Care [facility]) - Discharge Note   Patient Details  Name: [name] MRN: [id number] Date of Birth: 1946/06/04  Transition of [facility]) CM/SW Contact:  NORMAN ASPEN, LCSW Phone Number: 06/04/2024, 3:50 PM   Clinical Narrative:     Met with pt who confirms need for RW and no DME agency preference.  Order placed with [facility] for delivery to room prior to discharge.  Pt aware that HHPT prearranged with [facility] via ortho MD office prior to surgery.  No further IP CM needs.  Final next level of care: Home w Home Health Services Barriers to Discharge: No Barriers Identified   Patient Goals and CMS Choice Patient states their goals for this hospitalization and ongoing recovery are:: return home          Discharge Placement                       Discharge Plan and Services Additional resources added to the After Visit Summary for                  DME Arranged: Walker rolling DME Agency: [facility] Date DME Agency Contacted: 06/04/24 Time DME Agency Contacted: 1550 Representative spoke with at DME Agency: D[facility] HH Arranged: PT HH Agency: [facility]        Social Drivers of Health (SDOH) Interventions SDOH Screenings   Food Insecurity: No Food Insecurity (06/04/2024)  Housing: Low Risk  (06/04/2024)  Transportation Needs: No Transportation Needs (06/04/2024)  Utilities: Not At Risk (06/04/2024)  Alcohol Screen: Low Risk  (05/07/2024)  Depression (PHQ2-9): Low Risk  (05/11/2024)  Financial Resource Strain: Low Risk  (05/07/2024)  Physical Activity: Insufficiently Active (05/07/2024)  Social Connections: Socially Integrated (06/04/2024)  Stress: No Stress Concern Present (05/07/2024)  Tobacco Use: Medium Risk (06/04/2024)  Health Literacy: Adequate Health Literacy (03/16/2024)     Readmission Risk Interventions     No data to display

## 2024-06-04 NOTE — Anesthesia Postprocedure Evaluation (Signed)
 Anesthesia Post Note  Patient: [name]  Procedure(s) Performed: ARTHROPLASTY, KNEE, TOTAL (Right: Knee)     Patient location during evaluation: PACU Anesthesia Type: Spinal Level of consciousness: awake and alert Pain management: pain level controlled Vital Signs Assessment: post-procedure vital signs reviewed and stable Respiratory status: spontaneous breathing, nonlabored ventilation, respiratory function stable and patient connected to nasal cannula oxygen Cardiovascular status: blood pressure returned to baseline and stable Postop Assessment: no apparent nausea or vomiting Anesthetic complications: no   No notable events documented.  Last Vitals:  Vitals:   06/04/24 1228 06/04/24 1231  BP: (!) 146/79   Pulse: (!) 105 94  Resp: 16   Temp: 36.9 C   SpO2: 98%     Last Pain:  Vitals:   06/04/24 1145  TempSrc:   PainSc: Asleep                 Lynwood MARLA Cornea

## 2024-06-04 NOTE — Transfer of Care (Signed)
 Immediate Anesthesia Transfer of Care Note  Patient: [name]  Procedure(s) Performed: ARTHROPLASTY, KNEE, TOTAL (Right: Knee)  Patient Location: PACU  Anesthesia Type:General  Level of Consciousness: drowsy  Airway & Oxygen Therapy: Patient Spontanous Breathing and Patient connected to face mask oxygen  Post-op Assessment: Report given to RN and Post -op Vital signs reviewed and stable  Post vital signs: Reviewed and stable  Last Vitals:  Vitals Value Taken Time  BP 145/95 06/04/24 10:26  Temp    Pulse 103 06/04/24 10:28  Resp 14 06/04/24 10:28  SpO2 100 % 06/04/24 10:28  Vitals shown include unfiled device data.  Last Pain:  Vitals:   06/04/24 0706  TempSrc:   PainSc: 3          Complications: No notable events documented.

## 2024-06-04 NOTE — Op Note (Signed)
 Operative Note  Date of operation: 06/04/2024 Preoperative diagnosis: Right knee primary osteoarthritis Postoperative diagnosis: Same  Procedure: Right cemented total knee arthroplasty  Implants: Biomet/Zimmer persona cemented knee system Implant Name Type Inv. Item Serial No. Manufacturer Lot No. LRB No. Used Action  CEMENT BONE R 1X40 - [id number] Cement CEMENT BONE R 1X40  ZIMMER RECON(ORTH,TRAU,BIO,SG) [id number] Right 1 Implanted  CEMENT BONE R 1X40 - [id number] Cement CEMENT BONE R 1X40  ZIMMER RECON(ORTH,TRAU,BIO,SG) [id number] Right 1 Implanted  STEM POLY PAT PLY 30M KNEE - [id number] Knees STEM POLY PAT PLY 30M KNEE  ZIMMER RECON(ORTH,TRAU,BIO,SG) [id number] Right 1 Implanted  COMPONENT TIB CMT PS KN F 0DRT - [id number] Joint COMPONENT TIB CMT PS KN F 0DRT  ZIMMER RECON(ORTH,TRAU,BIO,SG) [id number] Right 1 Implanted  COMPONENT FEM CMT PRNSA [id number] - [id number] Joint COMPONENT FEM CMT PRNSA [id number]  ZIMMER RECON(ORTH,TRAU,BIO,SG) [id number] Right 1 Implanted  INSERT TIBIA KNEE RIGHT 10 - [id number] Joint INSERT TIBIA KNEE RIGHT 10  ZIMMER RECON(ORTH,TRAU,BIO,SG) [id number] Right 1 Implanted   Surgeon: Lonni GRADE. Vernetta, MD Assistant: Tory Gaskins, PA-C  Anesthesia: #1 right lower extremity adductor canal block, #2 General, #3 local EBL: Less than 50 cc Tourniquet time under 1 hour Antibiotics: IV Ancef  Complications: None  Indications: The patient is a 78 year old active gentleman with debilitating arthritis involving his right and left knees.  He wishes to proceed with a right total knee arthroplasty today.  He understands the full risks of acute blood loss anemia, nerve and vessel injury, fracture, infection, DVT, implant failure and wound healing issues.  He understands that our goals are hopefully decreased pain, improved mobility and improve quality of life.  He has tried and failed all forms conservative treatment for over a year now.  Procedure description: After informed  consent was obtained and the appropriate right knee was marked, anesthesia obtained a right lower extremity adductor canal block in the holding room.  The patient was then brought to the operating room and set up on the operative table where spinal anesthesia was attempted but not successful given the patient's significant spinal history with previous surgery.  He was then laid in supine position a Foley catheter is placed and general anesthesia was obtained.  A nonsterile tourniquet placed from his upper right thigh and his right thigh, knee, leg and ankle were prepped and draped with DuraPrep and sterile drapes including a sterile stockinette.  A timeout was called and he was identified as the correct patient the correct right knee.  An Esmarch was then used to wrap out the leg and the tourniquet was plated to 300 mm pressure.  A direct midline incision was then made over the patella and carried proximally distally.  Dissection was carried down to the knee joint and a medial parapatellar arthrotomy was made.  There was a large joint effusion encountered.  With the knee in a flexed position we found complete denuding and wear out of the cartilage of the lateral aspect of the knee in the patellofemoral joint.  We removed osteophytes from all 3 compartments as well as the ACL and medial lateral meniscus.  We then used the extramedullary cutting guide for making her proximal tibia cut correction for varus and valgus and a 7 degree slope.  Made this cut to take 2 mm off the low side and we did back it down to more millimeters.  We then used a intramedullary based cutting guide for distal femur cut through the notch of the femur setting this for a right  knee at 5 degrees externally rotated and a 10 mm distal femur cut.  We made this cut without difficulty and brought the knee back down to full extension and achieve full extension with a 10 mm block.  We then impacted the femur and put a femoral sizing guide based off  the epicondylar axis.  Based off this we chose a size 10 right femur.  We put a 4-in-1 cutting block for a size 10 femur and made our anterior and posterior cuts followed by chamfer cuts.  We then backed the tibia and chose a size F right tibial tray for coverage over the tibial plateau setting the rotation of the tibial tubercle and the femur.  We secured this with some screws and then did our drill hole and keel punch.  We then trialed our size F right tibia combined with our size 10 right CR standard femur.  We trialed a 10 mm thickness right medial congruent polythene insert and made a patella cut for a size 32 patella button.  With all trial instrumentation of the knee we are pleased with range of motion and stability.  All trial components were removed and then the knee was irrigated with normal saline solution using pulsatile lavage.  Marcaine  with epinephrine  was then placed around the arthrotomy.  Next with the knee in a flexed position the cement was mixed and we cemented our Biomet/Zimmer persona tibial tray for right knee size F followed by cementing our size 10 right CR standard femur.  We placed our 10 mm thickness polyethylene insert but had trouble placing that insert so we had to remove that insert due to some damage to the insert as we are trying to put it in and place a new 10 mm thickness right polythene insert and that was secured nicely.  We then cemented our size 32 patella button.  The knee was held fully extended and compressed while the cemented hardened.  Excess cement debris is removed from the knee.  The tourniquet was let down and hemostasis was obtained with electrocautery.  The arthrotomy was closed with interrupted #1 Vicryl suture followed by 0 Vicryl because deep tissue and 2-0 Vicryl to close the subcutaneous tissue.  The skin was closed with staples.  Well-padded sterile dressing was applied.  The patient was awakened, extubated and taken to the recovery room.  Tory Gaskins, PA-C  did assist during the entire case and beginning to end and his assistance was crucial and medically necessary for soft tissue management and retraction, helping guide implant placement and a layered closure of the wound.

## 2024-06-04 NOTE — Discharge Instructions (Addendum)
 Information on my medicine - ELIQUIS (apixaban)  This medication education was reviewed with me or my healthcare representative as part of my discharge preparation.  Why was Eliquis prescribed for you? Eliquis was prescribed for you to reduce the risk of blood clots forming after orthopedic surgery.    What do You need to know about Eliquis? Take your Eliquis TWICE DAILY - one tablet in the morning and one tablet in the evening with or without food.  It would be best to take the dose about the same time each day.  If you have difficulty swallowing the tablet whole please discuss with your pharmacist how to take the medication safely.  Take Eliquis exactly as prescribed by your doctor and DO NOT stop taking Eliquis without talking to the doctor who prescribed the medication.  Stopping without other medication to take the place of Eliquis may increase your risk of developing a clot.  After discharge, you should have regular check-up appointments with your healthcare provider that is prescribing your Eliquis.  What do you do if you miss a dose? If a dose of ELIQUIS is not taken at the scheduled time, take it as soon as possible on the same day and twice-daily administration should be resumed.  The dose should not be doubled to make up for a missed dose.  Do not take more than one tablet of ELIQUIS at the same time.  Important Safety Information A possible side effect of Eliquis is bleeding. You should call your healthcare provider right away if you experience any of the following: Bleeding from an injury or your nose that does not stop. Unusual colored urine (red or dark brown) or unusual colored stools (red or black). Unusual bruising for unknown reasons. A serious fall or if you hit your head (even if there is no bleeding).  Some medicines may interact with Eliquis and might increase your risk of bleeding or clotting while on Eliquis. To help avoid this, consult your healthcare  provider or pharmacist prior to using any new prescription or non-prescription medications, including herbals, vitamins, non-steroidal anti-inflammatory drugs (NSAIDs) and supplements.  This website has more information on Eliquis (apixaban): http://www.eliquis.com/eliquis/home   Per [facility] clinic policy, our goal is ensure optimal postoperative pain control with a multimodal pain management strategy. For all [facility] patients, our goal is to wean post-operative narcotic medications by 6 weeks post-operatively. If this is not possible due to utilization of pain medication prior to surgery, your [facility] doctor will support your acute post-operative pain control for the first 6 weeks postoperatively, with a plan to transition you back to your primary pain team following that. [name] will work to ensure a Therapist, occupational.  INSTRUCTIONS AFTER JOINT REPLACEMENT   Remove items at home which could result in a fall. This includes throw rugs or furniture in walking pathways ICE to the affected joint every three hours while awake for 30 minutes at a time, for at least the first 3-5 days, and then as needed for pain and swelling.  Continue to use ice for pain and swelling. You may notice swelling that will progress down to the foot and ankle.  This is normal after surgery.  Elevate your leg when you are not up walking on it.   Continue to use the breathing machine you got in the hospital (incentive spirometer) which will help keep your temperature down.  It is common for your temperature to cycle up and down following surgery, especially at night when you are not  up moving around and exerting yourself.  The breathing machine keeps your lungs expanded and your temperature down.   DIET:  As you were doing prior to hospitalization, we recommend a well-balanced diet.  DRESSING / WOUND CARE / SHOWERING  Keep the surgical dressing until follow up.  The dressing is water  proof, so you can shower without any  extra covering.  IF THE DRESSING FALLS OFF or the wound gets wet inside, change the dressing with sterile gauze.  Please use good hand washing techniques before changing the dressing.  Do not use any lotions or creams on the incision until instructed by your surgeon.    ACTIVITY  Increase activity slowly as tolerated, but follow the weight bearing instructions below.   No driving for 6 weeks or until further direction given by your physician.  You cannot drive while taking narcotics.  No lifting or carrying greater than 10 lbs. until further directed by your surgeon. Avoid periods of inactivity such as sitting longer than an hour when not asleep. This helps prevent blood clots.  You may return to work once you are authorized by your doctor.     WEIGHT BEARING   Weight bearing as tolerated with assist device (walker, cane, etc) as directed, use it as long as suggested by your surgeon or therapist, typically at least 4-6 weeks.   EXERCISES  Results after joint replacement surgery are often greatly improved when you follow the exercise, range of motion and muscle strengthening exercises prescribed by your doctor. Safety measures are also important to protect the joint from further injury. Any time any of these exercises cause you to have increased pain or swelling, decrease what you are doing until you are comfortable again and then slowly increase them. If you have problems or questions, call your caregiver or physical therapist for advice.   Rehabilitation is important following a joint replacement. After just a few days of immobilization, the muscles of the leg can become weakened and shrink (atrophy).  These exercises are designed to build up the tone and strength of the thigh and leg muscles and to improve motion. Often times heat used for twenty to thirty minutes before working out will loosen up your tissues and help with improving the range of motion but do not use heat for the first two  weeks following surgery (sometimes heat can increase post-operative swelling).   These exercises can be done on a training (exercise) mat, on the floor, on a table or on a bed. Use whatever works the best and is most comfortable for you.    Use music or television while you are exercising so that the exercises are a pleasant break in your day. This will make your life better with the exercises acting as a break in your routine that you can look forward to.   Perform all exercises about fifteen times, three times per day or as directed.  You should exercise both the operative leg and the other leg as well.  Exercises include:   Quad Sets - Tighten up the muscle on the front of the thigh (Quad) and hold for 5-10 seconds.   Straight Leg Raises - With your knee straight (if you were given a brace, keep it on), lift the leg to 60 degrees, hold for 3 seconds, and slowly lower the leg.  Perform this exercise against resistance later as your leg gets stronger.  Leg Slides: Lying on your back, slowly slide your foot toward your buttocks, bending your  knee up off the floor (only go as far as is comfortable). Then slowly slide your foot back down until your leg is flat on the floor again.  Angel Wings: Lying on your back spread your legs to the side as far apart as you can without causing discomfort.  Hamstring Strength:  Lying on your back, push your heel against the floor with your leg straight by tightening up the muscles of your buttocks.  Repeat, but this time bend your knee to a comfortable angle, and push your heel against the floor.  You may put a pillow under the heel to make it more comfortable if necessary.   A rehabilitation program following joint replacement surgery can speed recovery and prevent re-injury in the future due to weakened muscles. Contact your doctor or a physical therapist for more information on knee rehabilitation.    CONSTIPATION  Constipation is defined medically as fewer than  three stools per week and severe constipation as less than one stool per week.  Even if you have a regular bowel pattern at home, your normal regimen is likely to be disrupted due to multiple reasons following surgery.  Combination of anesthesia, postoperative narcotics, change in appetite and fluid intake all can affect your bowels.   YOU MUST use at least one of the following options; they are listed in order of increasing strength to get the job done.  They are all available over the counter, and you may need to use some, POSSIBLY even all of these options:    Drink plenty of fluids (prune juice may be helpful) and high fiber foods Colace 100 mg by mouth twice a day  Senokot for constipation as directed and as needed Dulcolax (bisacodyl), take with full glass of water   Miralax (polyethylene glycol) once or twice a day as needed.  If you have tried all these things and are unable to have a bowel movement in the first 3-4 days after surgery call either your surgeon or your primary doctor.    If you experience loose stools or diarrhea, hold the medications until you stool forms back up.  If your symptoms do not get better within 1 week or if they get worse, check with your doctor.  If you experience the worst abdominal pain ever or develop nausea or vomiting, please contact the office immediately for further recommendations for treatment.   ITCHING:  If you experience itching with your medications, try taking only a single pain pill, or even half a pain pill at a time.  You can also use Benadryl over the counter for itching or also to help with sleep.   TED HOSE STOCKINGS:  Use stockings on both legs until for at least 2 weeks or as directed by physician office. They may be removed at night for sleeping.  MEDICATIONS:  See your medication summary on the "After Visit Summary" that nursing will review with you.  You may have some home medications which will be placed on hold until you complete the  course of blood thinner medication.  It is important for you to complete the blood thinner medication as prescribed.  PRECAUTIONS:  If you experience chest pain or shortness of breath - call 911 immediately for transfer to the hospital emergency department.   If you develop a fever greater that 101 F, purulent drainage from wound, increased redness or drainage from wound, foul odor from the wound/dressing, or calf pain - CONTACT YOUR SURGEON.  FOLLOW-UP APPOINTMENTS:  If you do not already have a post-op appointment, please call the office for an appointment to be seen by your surgeon.  Guidelines for how soon to be seen are listed in your "After Visit Summary", but are typically between 1-4 weeks after surgery.  OTHER INSTRUCTIONS:   Knee Replacement:  Do not place pillow under knee, focus on keeping the knee straight while resting. CPM instructions: 0-90 degrees, 2 hours in the morning, 2 hours in the afternoon, and 2 hours in the evening. Place foam block, curve side up under heel at all times except when in CPM or when walking.  DO NOT modify, tear, cut, or change the foam block in any way.  POST-OPERATIVE OPIOID TAPER INSTRUCTIONS: It is important to wean off of your opioid medication as soon as possible. If you do not need pain medication after your surgery it is ok to stop day one. Opioids include: Codeine, Hydrocodone (Norco, Vicodin), Oxycodone (Percocet, oxycontin ) and hydromorphone  amongst others.  Long term and even short term use of opiods can cause: Increased pain response Dependence Constipation Depression Respiratory depression And more.  Withdrawal symptoms can include Flu like symptoms Nausea, vomiting And more Techniques to manage these symptoms Hydrate well Eat regular healthy meals Stay active Use relaxation techniques(deep breathing, meditating, yoga) Do Not substitute Alcohol to help with tapering If you have been  on opioids for less than two weeks and do not have pain than it is ok to stop all together.  Plan to wean off of opioids This plan should start within one week post op of your joint replacement. Maintain the same interval or time between taking each dose and first decrease the dose.  Cut the total daily intake of opioids by one tablet each day Next start to increase the time between doses. The last dose that should be eliminated is the evening dose.   MAKE SURE YOU:  Understand these instructions.  Get help right away if you are not doing well or get worse.    Thank you for letting us  be a part of your medical care team.  It is a privilege we respect greatly.  We hope these instructions will help you stay on track for a fast and full recovery!     Dental Antibiotics:  In most cases prophylactic antibiotics for Dental procdeures after total joint surgery are not necessary.  Exceptions are as follows:  1. History of prior total joint infection  2. Severely immunocompromised (Organ Transplant, cancer chemotherapy, Rheumatoid biologic meds such as Humera)  3. Poorly controlled diabetes (A1C &gt; 8.0, blood glucose over 200)  If you have one of these conditions, contact your surgeon for an antibiotic prescription, prior to your dental procedure.

## 2024-06-04 NOTE — Anesthesia Procedure Notes (Signed)
 Spinal  Patient location during procedure: OR Start time: 06/04/2024 8:29 AM End time: 06/04/2024 8:30 AM Reason for block: surgical anesthesia Staffing Performed: anesthesiologist  Anesthesiologist: Keneth Lynwood POUR, MD Performed by: Keneth Lynwood POUR, MD Authorized by: Keneth Lynwood POUR, MD   Preanesthetic Checklist Completed: patient identified, IV checked, site marked, risks and benefits discussed, surgical consent, monitors and equipment checked, pre-op evaluation and timeout performed Spinal Block Patient position: sitting Prep: DuraPrep Patient monitoring: heart rate, cardiac monitor, continuous pulse ox and blood pressure Approach: midline Location: L3-4 Injection technique: single-shot Needle Needle type: Sprotte  Needle gauge: 22 G Needle length: 9 cm Assessment Sensory level: T4 Events: CSF return

## 2024-06-04 NOTE — Evaluation (Signed)
 Physical Therapy Evaluation Patient Details Name: [name] MRN: [id number] DOB: Oct 29, 1945 Today's Date: 06/04/2024  History of Present Illness  78 yo male presents to therapy s/p R TKA on 06/05/2023 due to failure of conservative measures. Pt PMH includes but is not limited to: OA, B TSA,  B LE edema, bradycardia, chronic pain, GI bleed, anemia, DDD of lumbar spine s/p surgery, HLD, COPD, GERD,  and HTN.  Clinical Impression    [name] is a 78 y.o. male POD 0 s/p R TKA. Patient reports mod I with mobility at baseline. Patient is now limited by functional impairments (see PT problem list below) and requires S for bed mobility and CGA for transfers. Patient was able to ambulate 30 feet with RW and CGA level of assist. Patient instructed in exercise to facilitate ROM and circulation to manage edema. Patient will benefit from continued skilled PT interventions to address impairments and progress towards PLOF. Acute PT will follow to progress mobility and stair training in preparation for safe discharge home with family support and [facility] services.       If plan is discharge home, recommend the following: A lot of help with walking and/or transfers;A little help with bathing/dressing/bathroom;Assistance with cooking/housework;Assist for transportation;Help with stairs or ramp for entrance   Can travel by private vehicle        Equipment Recommendations Rolling walker (2 wheels)  Recommendations for Other Services       Functional Status Assessment Patient has had a recent decline in their functional status and demonstrates the ability to make significant improvements in function in a reasonable and predictable amount of time.     Precautions / Restrictions Precautions Precautions: Knee;Fall Restrictions Weight Bearing Restrictions Per Provider Order: No      Mobility  Bed Mobility Overal bed mobility: Needs Assistance Bed Mobility: Supine to Sit     Supine to sit:  Supervision, HOB elevated     General bed mobility comments: min cues    Transfers Overall transfer level: Needs assistance Equipment used: Rolling walker (2 wheels) Transfers: Sit to/from Stand Sit to Stand: Contact guard assist           General transfer comment: min cues and reports of slight dizziness    Ambulation/Gait Ambulation/Gait assistance: Contact guard assist Gait Distance (Feet): 30 Feet Assistive device: Rolling walker (2 wheels) Gait Pattern/deviations: Step-to pattern, Decreased stance time - right, Antalgic, Trunk flexed Gait velocity: decreased     General Gait Details: slight turnk flexion with B UE support at RW to offload R LE in stance phase, min cues for posture, safety and proper distance from RW  pt limited with gait tolerance today due to pain increasing from 6/10 to 8/10 with R LE WB  Stairs            Wheelchair Mobility     Tilt Bed    Modified Rankin (Stroke Patients Only)       Balance Overall balance assessment: Needs assistance Sitting-balance support: Feet supported Sitting balance-Leahy Scale: Good     Standing balance support: Bilateral upper extremity supported, During functional activity, Reliant on assistive device for balance Standing balance-Leahy Scale: Poor                               Pertinent Vitals/Pain Pain Assessment Pain Assessment: 0-10 Pain Score: 8  Pain Location: R LE and knee Pain Descriptors / Indicators: Aching, Constant, Discomfort, Operative  site guarding Pain Intervention(s): Limited activity within patient's tolerance, Monitored during session, Repositioned, Patient requesting pain meds-RN notified, RN gave pain meds during session, Ice applied    Home Living Family/patient expects to be discharged to:: Private residence Living Arrangements: Spouse/significant other Available Help at Discharge: Family Type of Home: House Home Access: Stairs to enter Entrance Stairs-Rails:  Left Entrance Stairs-Number of Steps: 3   Home Layout: One level Home Equipment: Cane - single point      Prior Function Prior Level of Function : Independent/Modified Independent             Mobility Comments: mod I with all ADLs self care tasks and IADLs with use of [employer]       Extremity/Trunk Assessment        Lower Extremity Assessment Lower Extremity Assessment: RLE deficits/detail RLE Deficits / Details: ankle DF/PF 5/5; SLR < 10 degree lag RLE Sensation: WNL    Cervical / Trunk Assessment Cervical / Trunk Assessment: Normal  Communication   Communication Communication: No apparent difficulties    Cognition Arousal: Alert Behavior During Therapy: WFL for tasks assessed/performed   PT - Cognitive impairments: No apparent impairments                         Following commands: Intact       Cueing Cueing Techniques: Verbal cues     General Comments      Exercises Total Joint Exercises Ankle Circles/Pumps: AROM, Both, 10 reps   Assessment/Plan    PT Assessment Patient needs continued PT services  PT Problem List Decreased strength;Decreased activity tolerance;Decreased range of motion;Decreased balance;Decreased mobility;Decreased coordination;Pain       PT Treatment Interventions DME instruction;Gait training;Stair training;Functional mobility training;Therapeutic activities;Therapeutic exercise;Balance training;Neuromuscular re-education;Patient/family education;Modalities    PT Goals (Current goals can be found in the Care Plan section)  Acute Rehab PT Goals Patient Stated Goal: to get back to doing what I was before PT Goal Formulation: With patient Time For Goal Achievement: 06/18/24 Potential to Achieve Goals: Good    Frequency 7X/week     Co-evaluation               AM-PAC PT 6 Clicks Mobility  Outcome Measure Help needed turning from your back to your side while in a flat bed without using bedrails?: None Help  needed moving from lying on your back to sitting on the side of a flat bed without using bedrails?: None Help needed moving to and from a bed to a chair (including a wheelchair)?: A Little Help needed standing up from a chair using your arms (e.g., wheelchair or bedside chair)?: A Little Help needed to walk in hospital room?: A Little Help needed climbing 3-5 steps with a railing? : A Lot 6 Click Score: 19    End of Session   Activity Tolerance: Patient limited by pain Patient left: in chair;with call bell/phone within reach Nurse Communication: Mobility status PT Visit Diagnosis: Unsteadiness on feet (R26.81);Other abnormalities of gait and mobility (R26.89);Muscle weakness (generalized) (M62.81);Difficulty in walking, not elsewhere classified (R26.2);Pain Pain - Right/Left: Right Pain - part of body: Knee;Leg    Time: 1435-1454 PT Time Calculation (min) (ACUTE ONLY): 19 min   Charges:   PT Evaluation $PT Eval Low Complexity: 1 Low   PT General Charges $$ ACUTE PT VISIT: 1 Visit         [place], PT Acute Rehab   [place] VEAR Drone 06/04/2024, 3:06 PM

## 2024-06-04 NOTE — Interval H&P Note (Signed)
 History and Physical Interval Note: The patient understands that he is here today for a right total knee replacement to treat his significant right knee pain and arthritis.  There has been no acute or interval changes in medical status.  The risks and benefits of surgery have been discussed in detail and informed consent has been obtained.  The right operative knee has been.  06/04/2024 7:05 AM  [name]  has presented today for surgery, with the diagnosis of osteoarthritis right knee.  The various methods of treatment have been discussed with the patient and family. After consideration of risks, benefits and other options for treatment, the patient has consented to  Procedure(s): ARTHROPLASTY, KNEE, TOTAL (Right) as a surgical intervention.  The patient's history has been reviewed, patient examined, no change in status, stable for surgery.  I have reviewed the patient's chart and labs.  Questions were answered to the patient's satisfaction.     [name]

## 2024-06-04 NOTE — Anesthesia Procedure Notes (Signed)
 Procedure Name: LMA Insertion Date/Time: 06/04/2024 8:51 AM  Performed by: Zulema Leita PARAS, CRNAPre-anesthesia Checklist: Patient identified, Emergency Drugs available, Suction available and Patient being monitored Patient Re-evaluated:Patient Re-evaluated prior to induction Oxygen Delivery Method: Circle system utilized Preoxygenation: Pre-oxygenation with 100% oxygen Induction Type: IV induction LMA: LMA with gastric port inserted LMA Size: 4.0 Number of attempts: 1 Placement Confirmation: positive ETCO2 and breath sounds checked- equal and bilateral Tube secured with: Tape Dental Injury: Teeth and Oropharynx as per pre-operative assessment

## 2024-06-04 NOTE — Anesthesia Procedure Notes (Signed)
 Procedure Name: MAC Date/Time: 06/04/2024 8:24 AM  Performed by: Zulema Leita PARAS, CRNAPre-anesthesia Checklist: Patient identified, Emergency Drugs available, Suction available and Patient being monitored Oxygen Delivery Method: Simple face mask

## 2024-06-05 LAB — CBC
HCT: 41.2 % (ref 39.0–52.0)
Hemoglobin: 13.1 g/dL (ref 13.0–17.0)
MCH: 30.7 pg (ref 26.0–34.0)
MCHC: 31.8 g/dL (ref 30.0–36.0)
MCV: 96.5 fL (ref 80.0–100.0)
Platelets: 241 K/uL (ref 150–400)
RBC: 4.27 MIL/uL (ref 4.22–5.81)
RDW: 12.6 % (ref 11.5–15.5)
WBC: 10.2 K/uL (ref 4.0–10.5)
nRBC: 0 % (ref 0.0–0.2)

## 2024-06-05 LAB — BASIC METABOLIC PANEL WITH GFR
Anion gap: 10 (ref 5–15)
BUN: 15 mg/dL (ref 8–23)
CO2: 24 mmol/L (ref 22–32)
Calcium: 8.9 mg/dL (ref 8.9–10.3)
Chloride: 102 mmol/L (ref 98–111)
Creatinine, Ser: 0.7 mg/dL (ref 0.61–1.24)
GFR, Estimated: >60 mL/min (ref >60)
Glucose, Bld: 139 mg/dL — ABNORMAL HIGH (ref 70–99)
Potassium: 4.3 mmol/L (ref 3.5–5.1)
Sodium: 136 mmol/L (ref 135–145)

## 2024-06-05 MED ORDER — OXYCODONE HCL 5 MG PO TABS: 5.0000 mg | ORAL_TABLET | Freq: Four times a day (QID) | ORAL | 0 refills | Status: DC | PRN

## 2024-06-05 MED ORDER — METHOCARBAMOL 500 MG PO TABS: 500.0000 mg | ORAL_TABLET | Freq: Four times a day (QID) | ORAL | 1 refills | Status: AC | PRN

## 2024-06-05 MED ORDER — APIXABAN 2.5 MG PO TABS: 2.5000 mg | ORAL_TABLET | Freq: Two times a day (BID) | ORAL | 0 refills | Status: DC

## 2024-06-05 NOTE — Care Management Obs Status (Signed)
 MEDICARE OBSERVATION STATUS NOTIFICATION   Patient Details  Name: [name] MRN: [id number] Date of Birth: October 11, 1945   Medicare Observation Status Notification Given:  Yes    Sonda Manuella Quill, RN 06/05/2024, 11:45 AM

## 2024-06-05 NOTE — Plan of Care (Signed)
   Problem: Education: Goal: Knowledge of General Education information will improve Description Including pain rating scale, medication(s)/side effects and non-pharmacologic comfort measures Outcome: Progressing   Problem: Health Behavior/Discharge Planning: Goal: Ability to manage health-related needs will improve Outcome: Progressing

## 2024-06-05 NOTE — Progress Notes (Signed)
 Subjective: 1 Day Post-Op Procedure(s) (LRB): ARTHROPLASTY, KNEE, TOTAL (Right) Patient reports pain as moderate.    Objective: Vital signs in last 24 hours: Temp:  [98.1 F (36.7 C)-98.4 F (36.9 C)] 98.2 F (36.8 C) (10/04 0536) Pulse Rate:  [80-105] 80 (10/04 0817) Resp:  [12-23] 20 (10/04 0536) BP: (140-163)/(79-109) 163/86 (10/04 0817) SpO2:  [93 %-98 %] 94 % (10/04 0817)  Intake/Output from previous day: 10/03 0701 - 10/04 0700 In: 2160 [P.O.:560; I.V.:1300; IV Piggyback:300] Out: 3225 [Urine:3200; Blood:25] Intake/Output this shift: Total I/O In: 240 [P.O.:240] Out: -   Recent Labs    06/05/24 0341  HGB 13.1   Recent Labs    06/05/24 0341  WBC 10.2  RBC 4.27  HCT 41.2  PLT 241   Recent Labs    06/05/24 0341  NA 136  K 4.3  CL 102  CO2 24  BUN 15  CREATININE 0.70  GLUCOSE 139*  CALCIUM 8.9   No results for input(s): LABPT, INR in the last 72 hours.  Sensation intact distally Intact pulses distally Dorsiflexion/Plantar flexion intact Incision: dressing C/D/I Compartment soft   Assessment/Plan: 1 Day Post-Op Procedure(s) (LRB): ARTHROPLASTY, KNEE, TOTAL (Right) Up with therapy Discharge home with home health this afternoon.      [name] 06/05/2024, 10:30 AM

## 2024-06-05 NOTE — Progress Notes (Signed)
 Patient not cleared to discharge per PT. During session he became nauseated and had increased pain.

## 2024-06-05 NOTE — TOC Progression Note (Signed)
 Transition of Care [facility]) - Progression Note    Patient Details  Name: [name] MRN: [id number] Date of Birth: 1945/12/29  [facility] CM/SW Contact  Sonda Manuella Quill, RN Phone Number: 06/05/2024, 2:16 PM  Clinical Narrative:    Spoke w/ pt and wife in room; they said RW was not delivered yesterday; they declined receiving BSC; spoke w/ Ada at [employer]; she said RW will be delivered to room today; no IP CM needs.     Barriers to Discharge: No Barriers Identified               Expected Discharge Plan and Services         Expected Discharge Date: 06/05/24               DME Arranged: Vannie rolling DME Agency: [employer] Date DME Agency Contacted: 06/04/24 Time DME Agency Contacted: 1550 Representative spoke with at DME Agency: Darlyn HH Arranged: PT HH Agency: [facility]         Social Drivers of Health (SDOH) Interventions SDOH Screenings   Food Insecurity: No Food Insecurity (06/04/2024)  Housing: Low Risk  (06/04/2024)  Transportation Needs: No Transportation Needs (06/04/2024)  Utilities: Not At Risk (06/04/2024)  Alcohol Screen: Low Risk  (05/07/2024)  Depression (PHQ2-9): Low Risk  (05/11/2024)  Financial Resource Strain: Low Risk  (05/07/2024)  Physical Activity: Insufficiently Active (05/07/2024)  Social Connections: Socially Integrated (06/04/2024)  Stress: No Stress Concern Present (05/07/2024)  Tobacco Use: Medium Risk (06/04/2024)  Health Literacy: Adequate Health Literacy (03/16/2024)    Readmission Risk Interventions     No data to display

## 2024-06-05 NOTE — Progress Notes (Addendum)
 PHYSICAL THERAPY  PM session withheld, Pt back in bed sleeping/resting requesting I come back tomorrow.  Issues of nausea and pain control.    Katheryn Leap  PTA Acute  Rehabilitation Services Office M-F          [phone]

## 2024-06-05 NOTE — Progress Notes (Signed)
 Physical Therapy Treatment Patient Details Name: [name] MRN: [id number] DOB: 24-Oct-1945 Today's Date: 06/05/2024   History of Present Illness 78 yo male presents to therapy s/p R TKA on 06/05/2023 due to failure of conservative measures. Pt PMH includes but is not limited to: OA, B TSA,  B LE edema, bradycardia, chronic pain, GI bleed, anemia, DDD of lumbar spine s/p surgery, HLD, COPD, GERD,  and HTN.    PT Comments  POD # 1 am session AxO x 3 not feeling good today with issudes of nausea and pain control. OOB in recliner.  Attempted to amb.  General transfer comment: required increased assist for R LE due to increased pain as well as increased assist to rise from recliner VC's on proper hand placement to push up from recliner vs pull up on walker. General Gait Details: limited amb distance 4 feet due to increased pain, then dizziness, then nausea.  Recliner pulled up to pt.  BP was WNL 138/87.  Pt reported his knee pain at 10/10 sepite having had pain meds prior.  Pt also present with increased anxiety, I can't walk it hurts too bad. RN called to room. Pt unable to tolerate any TE's.  Applied ICE Man and Educated Pt/Spouse on proper use and how to fill.  Positioned Pt's R LE in full extension with pillows and a hip roll to prevent Pt sitting with hip/knee flexed.   Will see Pt again this afternoon.     If plan is discharge home, recommend the following: A lot of help with walking and/or transfers;A little help with bathing/dressing/bathroom;Assistance with cooking/housework;Assist for transportation;Help with stairs or ramp for entrance   Can travel by private vehicle        Equipment Recommendations  Rolling walker (2 wheels)    Recommendations for Other Services       Precautions / Restrictions Precautions Precaution/Restrictions Comments: no pillow under knee, knee in full extension at rest Restrictions Weight Bearing Restrictions Per Provider Order: No     Mobility   Bed Mobility               General bed mobility comments: OOB in recliner    Transfers Overall transfer level: Needs assistance Equipment used: Rolling walker (2 wheels) Transfers: Sit to/from Stand Sit to Stand: Min assist, Mod assist           General transfer comment: required increased assist for R LE due to increased pain as well as increased assist to rise from recliner VC's on proper hand placement to push up from recliner vs pull up on walker.    Ambulation/Gait Ambulation/Gait assistance: Min assist Gait Distance (Feet): 4 Feet Assistive device: Rolling walker (2 wheels) Gait Pattern/deviations: Step-to pattern, Decreased stance time - right, Antalgic, Trunk flexed Gait velocity: decreased     General Gait Details: limited amb distance 4 feet due to increased pain, then dizziness, then nausea.  Recliner pulled up to pt.  BP was WNL 138/87.  Pt reported his knee pain at 10/10 sepite having had pain meds prior.  Pt also present with increased anxiety, I can't walk it hurts too bad. RN called to room.   Stairs             Wheelchair Mobility     Tilt Bed    Modified Rankin (Stroke Patients Only)       Balance  Communication Communication Communication: No apparent difficulties  Cognition Arousal: Alert Behavior During Therapy: WFL for tasks assessed/performed   PT - Cognitive impairments: No apparent impairments                       PT - Cognition Comments: AxO x 3 not feeling good today with issudes of nausea and pain control. Following commands: Intact      Cueing Cueing Techniques: Verbal cues  Exercises      General Comments        Pertinent Vitals/Pain Pain Assessment Pain Assessment: Faces Faces Pain Scale: Hurts whole lot Pain Location: R LE and knee Pain Descriptors / Indicators: Aching, Constant, Discomfort, Operative site guarding Pain  Intervention(s): Monitored during session, Premedicated before session, Repositioned, Ice applied    Home Living                          Prior Function            PT Goals (current goals can now be found in the care plan section) Progress towards PT goals: Progressing toward goals    Frequency    7X/week      PT Plan      Co-evaluation              AM-PAC PT 6 Clicks Mobility   Outcome Measure  Help needed turning from your back to your side while in a flat bed without using bedrails?: A Little Help needed moving from lying on your back to sitting on the side of a flat bed without using bedrails?: A Little Help needed moving to and from a bed to a chair (including a wheelchair)?: A Little Help needed standing up from a chair using your arms (e.g., wheelchair or bedside chair)?: A Little Help needed to walk in hospital room?: A Lot Help needed climbing 3-5 steps with a railing? : A Lot 6 Click Score: 16    End of Session Equipment Utilized During Treatment: Gait belt Activity Tolerance: Patient limited by fatigue;Patient limited by pain;Other (comment) (nausea) Patient left: in chair;with call bell/phone within reach Nurse Communication: Mobility status PT Visit Diagnosis: Unsteadiness on feet (R26.81);Other abnormalities of gait and mobility (R26.89);Muscle weakness (generalized) (M62.81);Difficulty in walking, not elsewhere classified (R26.2);Pain Pain - Right/Left: Right Pain - part of body: Knee;Leg     Time: 8781-8753 PT Time Calculation (min) (ACUTE ONLY): 28 min  Charges:    $Gait Training: 8-22 mins $Therapeutic Activity: 8-22 mins PT General Charges $$ ACUTE PT VISIT: 1 Visit                    Katheryn Leap  PTA Acute  Rehabilitation Services Office M-F          [phone]

## 2024-06-06 DIAGNOSIS — I10 Essential (primary) hypertension: Secondary | ICD-10-CM | POA: Diagnosis present

## 2024-06-06 DIAGNOSIS — K219 Gastro-esophageal reflux disease without esophagitis: Secondary | ICD-10-CM | POA: Diagnosis present

## 2024-06-06 DIAGNOSIS — Z87891 Personal history of nicotine dependence: Secondary | ICD-10-CM | POA: Diagnosis not present

## 2024-06-06 DIAGNOSIS — Z634 Disappearance and death of family member: Secondary | ICD-10-CM | POA: Diagnosis not present

## 2024-06-06 DIAGNOSIS — Z96611 Presence of right artificial shoulder joint: Secondary | ICD-10-CM | POA: Diagnosis present

## 2024-06-06 DIAGNOSIS — J439 Emphysema, unspecified: Secondary | ICD-10-CM | POA: Diagnosis present

## 2024-06-06 DIAGNOSIS — M25569 Pain in unspecified knee: Secondary | ICD-10-CM | POA: Diagnosis present

## 2024-06-06 DIAGNOSIS — I4510 Unspecified right bundle-branch block: Secondary | ICD-10-CM | POA: Diagnosis present

## 2024-06-06 DIAGNOSIS — Z8711 Personal history of peptic ulcer disease: Secondary | ICD-10-CM | POA: Diagnosis not present

## 2024-06-06 DIAGNOSIS — J309 Allergic rhinitis, unspecified: Secondary | ICD-10-CM | POA: Diagnosis present

## 2024-06-06 DIAGNOSIS — Z79899 Other long term (current) drug therapy: Secondary | ICD-10-CM | POA: Diagnosis not present

## 2024-06-06 DIAGNOSIS — Z7951 Long term (current) use of inhaled steroids: Secondary | ICD-10-CM | POA: Diagnosis not present

## 2024-06-06 DIAGNOSIS — Z85828 Personal history of other malignant neoplasm of skin: Secondary | ICD-10-CM | POA: Diagnosis not present

## 2024-06-06 DIAGNOSIS — Z973 Presence of spectacles and contact lenses: Secondary | ICD-10-CM | POA: Diagnosis not present

## 2024-06-06 DIAGNOSIS — Z8701 Personal history of pneumonia (recurrent): Secondary | ICD-10-CM | POA: Diagnosis not present

## 2024-06-06 DIAGNOSIS — M170 Bilateral primary osteoarthritis of knee: Secondary | ICD-10-CM | POA: Diagnosis present

## 2024-06-06 DIAGNOSIS — Z8249 Family history of ischemic heart disease and other diseases of the circulatory system: Secondary | ICD-10-CM | POA: Diagnosis not present

## 2024-06-06 DIAGNOSIS — Z96612 Presence of left artificial shoulder joint: Secondary | ICD-10-CM | POA: Diagnosis present

## 2024-06-06 DIAGNOSIS — E785 Hyperlipidemia, unspecified: Secondary | ICD-10-CM | POA: Diagnosis present

## 2024-06-06 DIAGNOSIS — H547 Unspecified visual loss: Secondary | ICD-10-CM | POA: Diagnosis present

## 2024-06-06 DIAGNOSIS — G894 Chronic pain syndrome: Secondary | ICD-10-CM | POA: Diagnosis present

## 2024-06-06 MED ORDER — TAMSULOSIN HCL 0.4 MG PO CAPS
0.4000 mg | ORAL_CAPSULE | Freq: Two times a day (BID) | ORAL | Status: DC
  Administered 2024-06-06 – 2024-06-07 (×3): 0.4 mg via ORAL
  Filled 2024-06-06 (×3): qty 1

## 2024-06-06 NOTE — Plan of Care (Signed)
 Patient teaching and education adequate, patient progression adequate.

## 2024-06-06 NOTE — Progress Notes (Signed)
 Subjective: 2 Days Post-Op Procedure(s) (LRB): ARTHROPLASTY, KNEE, TOTAL (Right) Patient reports pain as moderate.  Having difficulty voiding.  Objective: Vital signs in last 24 hours: Temp:  [98.4 F (36.9 C)-99.1 F (37.3 C)] 98.4 F (36.9 C) (10/05 0504) Pulse Rate:  [64-82] 82 (10/05 0504) Resp:  [16-20] 16 (10/05 0504) BP: (135-158)/(79-88) 135/79 (10/05 0504) SpO2:  [93 %-97 %] 97 % (10/05 0504)  Intake/Output from previous day: 10/04 0701 - 10/05 0700 In: 720 [P.O.:720] Out: 0  Intake/Output this shift: No intake/output data recorded.  Recent Labs    06/05/24 0341  HGB 13.1   Recent Labs    06/05/24 0341  WBC 10.2  RBC 4.27  HCT 41.2  PLT 241   Recent Labs    06/05/24 0341  NA 136  K 4.3  CL 102  CO2 24  BUN 15  CREATININE 0.70  GLUCOSE 139*  CALCIUM 8.9   No results for input(s): LABPT, INR in the last 72 hours.  Sensation intact distally Intact pulses distally Dorsiflexion/Plantar flexion intact Incision: dressing C/D/I Compartment soft   Assessment/Plan: 2 Days Post-Op Procedure(s) (LRB): ARTHROPLASTY, KNEE, TOTAL (Right) Up with therapy today.  Flomax ordered to encourage urination Plan for likely discharge home 10/6 to allow for additional mobilization     [facility] 06/06/2024, 8:47 AM

## 2024-06-06 NOTE — Progress Notes (Signed)
 Physical Therapy Treatment Patient Details Name: [name] MRN: [id number] DOB: 1945-09-11 Today's Date: 06/06/2024   History of Present Illness 78 yo male presents to therapy s/p R TKA on 06/05/2023 due to failure of conservative measures. Pt PMH includes but is not limited to: OA, B TSA,  B LE edema, bradycardia, chronic pain, GI bleed, anemia, DDD of lumbar spine s/p surgery, HLD, COPD, GERD,  and HTN.    PT Comments  Pt reports pain better after medication and agreeable to attempt to ambulate however again only tolerated very short distance due to increase in pain.  Pt requested to use urinal after ambulation.  Pt making very slow progress and pain limiting ability to participate.     If plan is discharge home, recommend the following: A lot of help with walking and/or transfers;A little help with bathing/dressing/bathroom;Assistance with cooking/housework;Assist for transportation;Help with stairs or ramp for entrance   Can travel by private vehicle        Equipment Recommendations  Rolling walker (2 wheels)    Recommendations for Other Services       Precautions / Restrictions Precautions Precautions: Knee;Fall Restrictions Weight Bearing Restrictions Per Provider Order: No     Mobility  Bed Mobility Overal bed mobility: Needs Assistance Bed Mobility: Supine to Sit     Supine to sit: Min assist, HOB elevated     General bed mobility comments: encouraged pt to perform self assist however pt reports too much pain, assist required for support/assist of L LE (prefers using left side of bed)    Transfers Overall transfer level: Needs assistance Equipment used: Rolling walker (2 wheels) Transfers: Sit to/from Stand Sit to Stand: Min assist           General transfer comment: multimodal cues for positioning and technique for pain control, assist to rise and stabilize as well as assist for positioning painful R LE    Ambulation/Gait Ambulation/Gait assistance:  Contact guard assist, Min assist Gait Distance (Feet): 15 Feet Assistive device: Rolling walker (2 wheels) Gait Pattern/deviations: Step-to pattern, Decreased stance time - right, Antalgic, Trunk flexed Gait velocity: decreased     General Gait Details: attempted ambulation however pt only able to ambulate around bed toward door, pt with increased right external hip rotation and toe out which pt reports is somewhat his baseline,  also decreased right knee flexion due to pain requested recliner to sit down due to increased pain   Stairs             Wheelchair Mobility     Tilt Bed    Modified Rankin (Stroke Patients Only)       Balance                                            Communication Communication Communication: No apparent difficulties  Cognition Arousal: Alert Behavior During Therapy: WFL for tasks assessed/performed   PT - Cognitive impairments: No apparent impairments                         Following commands: Intact      Cueing Cueing Techniques: Verbal cues  Exercises      General Comments        Pertinent Vitals/Pain Pain Assessment Pain Assessment: 0-10 Pain Score: 8  Pain Location: R LE and knee Pain Descriptors / Indicators: Aching,  Constant, Operative site guarding, Sore Pain Intervention(s): Monitored during session, Repositioned, Premedicated before session    Home Living                          Prior Function            PT Goals (current goals can now be found in the care plan section) Progress towards PT goals: Progressing toward goals    Frequency    7X/week      PT Plan      Co-evaluation              AM-PAC PT 6 Clicks Mobility   Outcome Measure  Help needed turning from your back to your side while in a flat bed without using bedrails?: A Little Help needed moving from lying on your back to sitting on the side of a flat bed without using bedrails?: A  Little Help needed moving to and from a bed to a chair (including a wheelchair)?: A Little Help needed standing up from a chair using your arms (e.g., wheelchair or bedside chair)?: A Little Help needed to walk in hospital room?: A Lot Help needed climbing 3-5 steps with a railing? : A Lot 6 Click Score: 16    End of Session Equipment Utilized During Treatment: Gait belt Activity Tolerance: Patient limited by pain Patient left: in chair;with call bell/phone within reach;Other (comment) (with tech to attempt to urinate) Nurse Communication: Mobility status PT Visit Diagnosis: Difficulty in walking, not elsewhere classified (R26.2);Pain Pain - Right/Left: Right Pain - part of body: Knee     Time: 1310-1330 PT Time Calculation (min) (ACUTE ONLY): 20 min  Charges:    $Gait Training: 8-22 mins PT General Charges $$ ACUTE PT VISIT: 1 Visit                    Tari KLEIN, DPT Physical Therapist Acute Rehabilitation Services Office: [phone]    Kati L Payson 06/06/2024, 3:07 PM

## 2024-06-06 NOTE — Progress Notes (Signed)
 Physical Therapy Treatment Patient Details Name: [name] MRN: [id number] DOB: 23-Jun-1946 Today's Date: 06/06/2024   History of Present Illness 78 yo male presents to therapy s/p R TKA on 06/05/2023 due to failure of conservative measures. Pt PMH includes but is not limited to: OA, B TSA,  B LE edema, bradycardia, chronic pain, GI bleed, anemia, DDD of lumbar spine s/p surgery, HLD, COPD, GERD,  and HTN.    PT Comments  Pt in bathroom on arrival and ready to move again.  Pt assisted out of bathroom however requested return to bed due to increased right hip and knee pain.  Pt assisted back to bed and positioned to comfort.  Pt and spouse anticipate d/c home tomorrow.     If plan is discharge home, recommend the following: A lot of help with walking and/or transfers;A little help with bathing/dressing/bathroom;Assistance with cooking/housework;Assist for transportation;Help with stairs or ramp for entrance   Can travel by private vehicle        Equipment Recommendations  Rolling walker (2 wheels)    Recommendations for Other Services       Precautions / Restrictions Precautions Precautions: Knee;Fall Restrictions Weight Bearing Restrictions Per Provider Order: No     Mobility  Bed Mobility               General bed mobility comments: pt in bathroom on arrival, ready to get off    Transfers Overall transfer level: Needs assistance Equipment used: Rolling walker (2 wheels) Transfers: Sit to/from Stand Sit to Stand: Min assist           General transfer comment: multimodal cues for positioning and technique for pain control, assist to rise and stabilize as well as assist for positioning painful R LE    Ambulation/Gait Ambulation/Gait assistance: Contact guard assist Gait Distance (Feet): 7 Feet Assistive device: Rolling walker (2 wheels) Gait Pattern/deviations: Step-to pattern, Decreased stance time - right, Antalgic, Trunk flexed       General Gait  Details: ambulated from bathroom back to bed, reports he is unable to ambulate farther due to increased pain in right knee and hip   Stairs             Wheelchair Mobility     Tilt Bed    Modified Rankin (Stroke Patients Only)       Balance                                            Communication Communication Communication: No apparent difficulties  Cognition Arousal: Alert Behavior During Therapy: WFL for tasks assessed/performed   PT - Cognitive impairments: No apparent impairments                         Following commands: Intact      Cueing Cueing Techniques: Verbal cues  Exercises      General Comments        Pertinent Vitals/Pain Pain Assessment Pain Assessment: 0-10 Pain Score: 9  Pain Location: R LE and knee Pain Descriptors / Indicators: Aching, Constant, Operative site guarding, Sore Pain Intervention(s): Repositioned, Monitored during session, Patient requesting pain meds-RN notified    Home Living                          Prior Function  PT Goals (current goals can now be found in the care plan section) Progress towards PT goals: Progressing toward goals    Frequency    7X/week      PT Plan      Co-evaluation              AM-PAC PT 6 Clicks Mobility   Outcome Measure  Help needed turning from your back to your side while in a flat bed without using bedrails?: A Little Help needed moving from lying on your back to sitting on the side of a flat bed without using bedrails?: A Little Help needed moving to and from a bed to a chair (including a wheelchair)?: A Little Help needed standing up from a chair using your arms (e.g., wheelchair or bedside chair)?: A Little Help needed to walk in hospital room?: A Lot Help needed climbing 3-5 steps with a railing? : A Lot 6 Click Score: 16    End of Session Equipment Utilized During Treatment: Gait belt Activity Tolerance:  Patient limited by pain Patient left: in bed;with call bell/phone within reach;with family/visitor present Nurse Communication: Mobility status;Patient requests pain meds PT Visit Diagnosis: Difficulty in walking, not elsewhere classified (R26.2);Pain Pain - Right/Left: Right Pain - part of body: Knee;Hip     Time: 8952-8941 PT Time Calculation (min) (ACUTE ONLY): 11 min  Charges:    $Gait Training: 8-22 mins PT General Charges $$ ACUTE PT VISIT: 1 Visit                     Tari KLEIN, DPT Physical Therapist Acute Rehabilitation Services Office: [phone]  Tari CROME Payson 06/06/2024, 12:16 PM

## 2024-06-06 NOTE — Progress Notes (Signed)
 Physical Therapy Treatment Patient Details Name: [name] MRN: [id number] DOB: [birth year] Today's Date: 06/06/2024   History of Present Illness 78 yo male presents to therapy s/p R TKA on 06/05/2023 due to failure of conservative measures. Pt PMH includes but is not limited to: OA, B TSA,  B LE edema, bradycardia, chronic pain, GI bleed, anemia, DDD of lumbar spine s/p surgery, HLD, COPD, GERD,  and HTN.    PT Comments  Pt agreeable to attempt LE exercises however requiring assist to perform and very limited with knee ROM due to increase in pain.  Pt reports 6/10 right knee pain at rest and increasing to 10/10 with movement especially flexion.  Pt also states he cannot tolerate neutral position of LE (tends to keep R LE externally rotated).  Pt with ice machine in place end of session.  Pt will need to be able to improve ambulation distance and perform steps (to enter home) prior to safely discharging.     If plan is discharge home, recommend the following: A lot of help with walking and/or transfers;A little help with bathing/dressing/bathroom;Assistance with cooking/housework;Assist for transportation;Help with stairs or ramp for entrance   Can travel by private vehicle        Equipment Recommendations  Rolling walker (2 wheels)    Recommendations for Other Services       Precautions / Restrictions Precautions Precautions: Knee;Fall Restrictions Weight Bearing Restrictions Per Provider Order: No     Mobility  Bed Mobility    Transfers    Ambulation/Gait    Stairs             Wheelchair Mobility     Tilt Bed    Modified Rankin (Stroke Patients Only)       Balance                                            Communication Communication Communication: No apparent difficulties  Cognition Arousal: Alert Behavior During Therapy: WFL for tasks assessed/performed   PT - Cognitive impairments: No apparent impairments                          Following commands: Intact      Cueing Cueing Techniques: Verbal cues  Exercises Total Joint Exercises Ankle Circles/Pumps: AROM, Both, 10 reps, Supine Quad Sets: AROM, Right, 10 reps, Limitations, Supine Quad Sets Limitations: fair quad contraction Heel Slides: AAROM, Limitations, 10 reps, Right, Supine Heel Slides Limitations: limited to less then 12* due to pain Hip ABduction/ADduction: AAROM, Right, 10 reps, Supine Straight Leg Raises: AAROM, Right, 10 reps, Supine    General Comments        Pertinent Vitals/Pain Pain Assessment Pain Assessment: 0-10 Pain Score: 6  Pain Location: R LE and knee Pain Descriptors / Indicators: Aching, Constant, Sore Pain Intervention(s): Monitored during session, Repositioned, Ice applied    Home Living                          Prior Function            PT Goals (current goals can now be found in the care plan section) Progress towards PT goals: Progressing toward goals    Frequency    7X/week      PT Plan      Co-evaluation  AM-PAC PT 6 Clicks Mobility   Outcome Measure  Help needed turning from your back to your side while in a flat bed without using bedrails?: A Little Help needed moving from lying on your back to sitting on the side of a flat bed without using bedrails?: A Little Help needed moving to and from a bed to a chair (including a wheelchair)?: A Little Help needed standing up from a chair using your arms (e.g., wheelchair or bedside chair)?: A Little Help needed to walk in hospital room?: A Lot Help needed climbing 3-5 steps with a railing? : A Lot 6 Click Score: 16    End of Session Equipment Utilized During Treatment: Gait belt Activity Tolerance: Patient limited by pain Patient left: in bed;with call bell/phone within reach;with bed alarm set Nurse Communication: Mobility status PT Visit Diagnosis: Difficulty in walking, not elsewhere classified  (R26.2);Pain Pain - Right/Left: Right Pain - part of body: Knee     Time: 8585-8571 PT Time Calculation (min) (ACUTE ONLY): 14 min  Charges:     $Therapeutic Exercise: 8-22 mins PT General Charges $$ ACUTE PT VISIT: 1 Visit                     Tari KLEIN, DPT Physical Therapist Acute Rehabilitation Services Office: [phone]    Tari CROME Payson 06/06/2024, 4:42 PM

## 2024-06-06 NOTE — Plan of Care (Signed)
  Problem: Activity: Goal: Risk for activity intolerance will decrease Outcome: Progressing   Problem: Pain Management: Goal: Pain level will decrease with appropriate interventions Outcome: Progressing   Problem: Skin Integrity: Goal: Will show signs of wound healing Outcome: Progressing

## 2024-06-06 NOTE — Plan of Care (Signed)

## 2024-06-07 ENCOUNTER — Encounter (HOSPITAL_COMMUNITY): Payer: Self-pay | Admitting: Orthopaedic Surgery

## 2024-06-07 NOTE — Progress Notes (Signed)
 Patient ID: [name], male   DOB: 09-24-1945, 78 y.o.   MRN: [id number] The patient is awake and alert this morning.  His vital signs are stable.  His right operative knee is stable.  He has struggled with mobility and pain control.  He would like to try to be able to be discharged to home today if he clears therapy.  We will put in for discharge this afternoon for if that is possible.

## 2024-06-07 NOTE — Discharge Summary (Signed)
 Patient ID: [name] MRN: [id number] DOB/AGE: 12-27-1945 78 y.o.  Admit date: 06/04/2024 Discharge date: 06/07/2024  Admission Diagnoses:  Principal Problem:   Unilateral primary osteoarthritis, right knee Active Problems:   Status post total right knee replacement   Discharge Diagnoses:  Same  Past Medical History:  Diagnosis Date   Allergic rhinitis    Atypical chest pain 12/01/2014   Back pain 01/12/2013   COPD (chronic obstructive pulmonary disease) (HCC)    FeV1 64%-2007   Dyspnea    Dysrhythmia    PVCs   Emphysema    GERD (gastroesophageal reflux disease)    H/O total shoulder replacement, left 2024   H/O total shoulder replacement, right 2023   History of lung abscess    bronchiectasis with RMLandRLL ersection -1996- Dr Brantley   Hordeolum externum (stye) 04/23/2015   Right eye   Hypertension    Impaired vision    glasses   Osteoarthritis    hands and knees   Pneumonia    Sinusitis, acute 04/23/2015   Skin cancer    Right eye area    Surgeries: Procedure(s): ARTHROPLASTY, KNEE, TOTAL on 06/04/2024   Consultants:   Discharged Condition: Improved  Hospital Course: EADEN HETTINGER is an 78 y.o. male who was admitted 06/04/2024 for operative treatment ofUnilateral primary osteoarthritis, right knee. Patient has severe unremitting pain that affects sleep, daily activities, and work/hobbies. After pre-op clearance the patient was taken to the operating room on 06/04/2024 and underwent  Procedure(s): ARTHROPLASTY, KNEE, TOTAL.    Patient was given perioperative antibiotics:  Anti-infectives (From admission, onward)    Start     Dose/Rate Route Frequency Ordered Stop   06/04/24 1500  ceFAZolin  (ANCEF ) IVPB 2g/100 mL premix        2 g 200 mL/hr over 30 Minutes Intravenous Every 6 hours 06/04/24 1205 06/04/24 2135   06/04/24 0630  ceFAZolin  (ANCEF ) IVPB 2g/100 mL premix        2 g 200 mL/hr over 30 Minutes Intravenous On call to O.R. 06/04/24 9373 06/04/24  0831        Patient was given sequential compression devices, early ambulation, and chemoprophylaxis to prevent DVT.  Inpatient Morphine Milligram Equivalents Per Day 10/3 - 10/6   Values displayed are in units of MME/Day    Order Start / End Date 10/3 10/4 Yesterday Today    oxyCODONE  (Oxy IR/ROXICODONE ) immediate release tablet 5 mg 10/3 - 10/3 7.5 of Unknown -- -- --    oxyCODONE  (ROXICODONE ) 5 MG/5ML solution 5 mg 10/3 - 10/3 0 of Unknown -- -- --      Group total: 7.5 of Unknown       fentaNYL  (SUBLIMAZE ) injection 50-100 mcg 10/3 - 10/3 15 of 15-30 -- -- --    fentaNYL  (SUBLIMAZE ) injection 25-50 mcg 10/3 - 10/3 45 of 45-90 -- -- --    fentaNYL  (SUBLIMAZE ) injection 10/3 - 10/3 *60 of 60 -- -- --    HYDROmorphone  (DILAUDID ) injection 0.25-0.5 mg 10/3 - 10/3 10 of 40-80 -- -- --    Daily Totals  * 137.5 of Unknown (at least 160-260) -- -- --  *One-Step medication  Calculation Errors     Order Type Date Details   oxyCODONE  (Oxy IR/ROXICODONE ) immediate release tablet 5 mg Ordered Dose -- Insufficient frequency information   oxyCODONE  (ROXICODONE ) 5 MG/5ML solution 5 mg Ordered Dose -- Insufficient frequency information            Patient benefited maximally from hospital  stay and there were no complications.    Recent vital signs: Patient Vitals for the past 24 hrs:  BP Temp Temp src Pulse Resp SpO2  06/07/24 0925 -- -- -- 76 16 97 %  06/07/24 0920 131/87 99.1 F (37.3 C) Temporal -- 16 95 %  06/07/24 0536 120/80 98.5 F (36.9 C) -- (!) 46 15 93 %  06/06/24 2109 (!) 138/96 98.2 F (36.8 C) Oral 81 20 93 %  06/06/24 1347 (!) 130/101 97.8 F (36.6 C) Oral 87 18 94 %     Recent laboratory studies:  Recent Labs    06/05/24 0341  WBC 10.2  HGB 13.1  HCT 41.2  PLT 241  NA 136  K 4.3  CL 102  CO2 24  BUN 15  CREATININE 0.70  GLUCOSE 139*  CALCIUM 8.9     Discharge Medications:   Allergies as of 06/07/2024       Reactions   Aspirin     Stomach bleeding     Nsaids    Do not give to patient due to hx of stomach bleeds    Losartan  Other (See Comments)   weakness   Meloxicam     Caused Stomach Bleeds         Medication List     STOP taking these medications    traMADol  50 MG tablet Commonly known as: ULTRAM        TAKE these medications    acetaminophen  500 MG tablet Commonly known as: TYLENOL  Take 1,000 mg by mouth in the morning and at bedtime.   albuterol  108 (90 Base) MCG/ACT inhaler Commonly known as: VENTOLIN  HFA INHALE 2 PUFFS INTO THE LUNGS EVERY 6 HOURS AS NEEDED FOR WHEEZING OR SHORTNESS OF BREATH.   amLODipine  5 MG tablet Commonly known as: NORVASC  Take 1 tablet (5 mg total) by mouth daily.   apixaban 2.5 MG Tabs tablet Commonly known as: ELIQUIS Take 1 tablet (2.5 mg total) by mouth every 12 (twelve) hours.   benzonatate  100 MG capsule Commonly known as: TESSALON  TAKE 1 CAPSULE BY MOUTH TWICE DAILY AS NEEDED FOR COUGH   CALCIUM CARBONATE ANTACID PO Take 1,000 mg by mouth in the morning.   cetirizine 10 MG tablet Commonly known as: ZYRTEC Take 10 mg by mouth in the morning.   fluticasone  50 MCG/ACT nasal spray Commonly known as: FLONASE  USE 2 SPRAYS IN EACH NOSTRIL EVERY DAY   GLUCOSAMINE-CHONDROITIN PO Take 1 tablet by mouth 2 (two) times daily.   guaiFENesin  600 MG 12 hr tablet Commonly known as: MUCINEX  Take 600 mg by mouth 2 (two) times daily.   ipratropium 0.06 % nasal spray Commonly known as: ATROVENT  Place 1 spray into both nostrils 4 (four) times daily.   loratadine 10 MG tablet Commonly known as: CLARITIN Take 10 mg by mouth every evening.   methocarbamol 500 MG tablet Commonly known as: ROBAXIN Take 1 tablet (500 mg total) by mouth every 6 (six) hours as needed for muscle spasms.   montelukast  10 MG tablet Commonly known as: SINGULAIR  TAKE 1 TABLET EVERY DAY   multivitamin with minerals Tabs tablet Take 1 tablet by mouth in the morning.   oxyCODONE  5 MG immediate release  tablet Commonly known as: Oxy IR/ROXICODONE  Take 1-2 tablets (5-10 mg total) by mouth every 6 (six) hours as needed for moderate pain (pain score 4-6) (pain score 4-6). No more than 6 tablets daily.   pantoprazole  20 MG tablet Commonly known as: PROTONIX  Take 1 tablet by mouth once  daily What changed: when to take this   Trelegy Ellipta  100-62.5-25 MCG/ACT Aepb Generic drug: Fluticasone -Umeclidin-Vilant INHALE 1 PUFF INTO THE LUNGS DAILY               Durable Medical Equipment  (From admission, onward)           Start     Ordered   06/04/24 1206  DME 3 n 1  Once        06/04/24 1205   06/04/24 1206  DME Walker rolling  Once       Question Answer Comment  Walker: With 5 Inch Wheels   Patient needs a walker to treat with the following condition Status post total right knee replacement      06/04/24 1205            Diagnostic Studies: DG Knee Right Port Result Date: 06/04/2024 CLINICAL DATA:  Status post right knee replacement. EXAM: PORTABLE RIGHT KNEE - 1-2 VIEW COMPARISON:  None Available. FINDINGS: Right knee arthroplasty in expected alignment. No periprosthetic lucency or fracture. There has been patellar resurfacing. Recent postsurgical change includes air and edema in the soft tissues and joint space. Anterior skin staples in place. IMPRESSION: Right knee arthroplasty without immediate postoperative complication. Electronically Signed   By: Andrea Gasman M.D.   On: 06/04/2024 13:56    Disposition: Discharge disposition: 01-Home or Self Care          Follow-up Information     Vernetta Lonni GRADE, MD Follow up in 2 week(s).   Specialty: Orthopedic Surgery Contact information: [address] [place] [address] [phone]         [facility] Follow up.   Specialty: Home Health Services Why: This provider will begin Home Health PT services upon discharge. Contact information: [address] [place]  [address] [phone]                  Signed: Lonni GRADE Vernetta 06/07/2024, 12:59 PM

## 2024-06-07 NOTE — Progress Notes (Signed)
 Physical Therapy Treatment Patient Details Name: [name] MRN: [id number] DOB: 1946/06/26 Today's Date: 06/07/2024   History of Present Illness 78 yo male presents to therapy s/p R TKA on 06/05/2023 due to failure of conservative measures. Pt PMH includes but is not limited to: OA, B TSA,  B LE edema, bradycardia, chronic pain, GI bleed, anemia, DDD of lumbar spine s/p surgery, HLD, COPD, GERD,  and HTN.    PT Comments  Pt making good progress this session, with firm encouragement pt able to amb incr distance and ascend/descend stairs with caregiver present and assisting.  Reviewed proper positioning RLE however pt continues to position in RLE hip external rotation and knee flexion. Pt with low threshold for knee ROM this session. Pt is ready to d/c from PT standpoint with caregiver assist as needed. Plan is for HHPT    If plan is discharge home, recommend the following: A lot of help with walking and/or transfers;A little help with bathing/dressing/bathroom;Assistance with cooking/housework;Assist for transportation;Help with stairs or ramp for entrance   Can travel by private vehicle        Equipment Recommendations  Rolling walker (2 wheels)    Recommendations for Other Services       Precautions / Restrictions Precautions Precautions: Knee;Fall Precaution/Restrictions Comments: no pillow under knee, knee in full extension at rest ( pt continues to position RLE in knee flexion and external rotation Restrictions Weight Bearing Restrictions Per Provider Order: No     Mobility  Bed Mobility               General bed mobility comments: NT pt on EOB on arrival    Transfers Overall transfer level: Needs assistance Equipment used: Rolling walker (2 wheels) Transfers: Sit to/from Stand Sit to Stand: Contact guard assist           General transfer comment: multimodal cues for positioning and technique for pain control, CGA for safety;  pt encouraged to incr knee  flexion for STS    Ambulation/Gait Ambulation/Gait assistance: Contact guard assist Gait Distance (Feet): 30 Feet Assistive device: Rolling walker (2 wheels) Gait Pattern/deviations: Step-to pattern, Decreased stance time - right, Antalgic, Trunk flexed Gait velocity: decreased     General Gait Details: cues for sequence and to incr wt shift to RLE during stance. pt with increased right external hip rotation and toe out which pt reports is somewhat his baseline,  also with increased right knee flexion due to pain. end of distance some improvement with knee extension, toe in and heel on floor   Stairs Stairs: Yes Stairs assistance: Contact guard assist, Min assist Stair Management: Step to pattern, With walker, Backwards Number of Stairs: 2 General stair comments: cues for sequence, technique and RW position; CGA to min assist to maneuver RW safely. no LOB. pt cargiver, Gilda present and able to assist/cue pt as needed   Wheelchair Mobility     Tilt Bed    Modified Rankin (Stroke Patients Only)       Balance   Sitting-balance support: Feet supported, No upper extremity supported Sitting balance-Leahy Scale: Good     Standing balance support: Bilateral upper extremity supported, During functional activity, Reliant on assistive device for balance Standing balance-Leahy Scale: Poor                              Communication Communication Communication: No apparent difficulties  Cognition Arousal: Alert Behavior During Therapy: [facility] for tasks  assessed/performed   PT - Cognitive impairments: No apparent impairments                         Following commands: Intact      Cueing Cueing Techniques: Verbal cues  Exercises      General Comments        Pertinent Vitals/Pain Pain Assessment Pain Assessment: Faces Faces Pain Scale: Hurts whole lot Pain Location: R LE and knee Pain Descriptors / Indicators: Aching, Constant, Sore Pain  Intervention(s): Limited activity within patient's tolerance, Monitored during session, Premedicated before session, Repositioned    Home Living                          Prior Function            PT Goals (current goals can now be found in the care plan section) Acute Rehab PT Goals Patient Stated Goal: to get back to doing what I was before PT Goal Formulation: With patient Time For Goal Achievement: 06/18/24 Potential to Achieve Goals: Good Progress towards PT goals: Progressing toward goals    Frequency    7X/week      PT Plan      Co-evaluation              AM-PAC PT 6 Clicks Mobility   Outcome Measure  Help needed turning from your back to your side while in a flat bed without using bedrails?: A Little Help needed moving from lying on your back to sitting on the side of a flat bed without using bedrails?: A Little Help needed moving to and from a bed to a chair (including a wheelchair)?: A Little Help needed standing up from a chair using your arms (e.g., wheelchair or bedside chair)?: A Little Help needed to walk in hospital room?: A Little Help needed climbing 3-5 steps with a railing? : A Little 6 Click Score: 18    End of Session Equipment Utilized During Treatment: Gait belt Activity Tolerance: Patient tolerated treatment well Patient left: Other (comment);with family/visitor present;with call bell/phone within reach (bathroom) Nurse Communication: Mobility status PT Visit Diagnosis: Difficulty in walking, not elsewhere classified (R26.2);Pain Pain - Right/Left: Right Pain - part of body: Knee     Time: 8942-8882 PT Time Calculation (min) (ACUTE ONLY): 20 min  Charges:    $Gait Training: 8-22 mins PT General Charges $$ ACUTE PT VISIT: 1 Visit                     Mclean Moya, PT  Acute Rehab Dept [facility]) [phone]  06/07/2024    [facility] 06/07/2024, 11:42 AM

## 2024-06-08 ENCOUNTER — Telehealth: Payer: Self-pay

## 2024-06-08 DIAGNOSIS — E43 Unspecified severe protein-calorie malnutrition: Secondary | ICD-10-CM | POA: Diagnosis not present

## 2024-06-08 DIAGNOSIS — E785 Hyperlipidemia, unspecified: Secondary | ICD-10-CM | POA: Diagnosis not present

## 2024-06-08 DIAGNOSIS — M51369 Other intervertebral disc degeneration, lumbar region without mention of lumbar back pain or lower extremity pain: Secondary | ICD-10-CM | POA: Diagnosis not present

## 2024-06-08 DIAGNOSIS — G894 Chronic pain syndrome: Secondary | ICD-10-CM | POA: Diagnosis not present

## 2024-06-08 DIAGNOSIS — D649 Anemia, unspecified: Secondary | ICD-10-CM | POA: Diagnosis not present

## 2024-06-08 DIAGNOSIS — R911 Solitary pulmonary nodule: Secondary | ICD-10-CM | POA: Diagnosis not present

## 2024-06-08 DIAGNOSIS — I340 Nonrheumatic mitral (valve) insufficiency: Secondary | ICD-10-CM | POA: Diagnosis not present

## 2024-06-08 DIAGNOSIS — J439 Emphysema, unspecified: Secondary | ICD-10-CM | POA: Diagnosis not present

## 2024-06-08 NOTE — Transitions of Care (Post Inpatient/ED Visit) (Signed)
 06/08/2024  Name: [name] MRN: [id number] DOB: October 28, 1945  Today's TOC FU Call Status: Today's TOC FU Call Status:: Successful TOC FU Call Completed TOC FU Call Complete Date: 06/08/24 Patient's Name and Date of Birth confirmed.  Transition Care Management Follow-up Telephone Call Date of Discharge: 06/07/24 Discharge Facility: [facility]) Type of Discharge: Inpatient Admission Primary Inpatient Discharge Diagnosis:: Right Total Knee Replacement How have you been since you were released from the hospital?: Better Any questions or concerns?: No  Items Reviewed: Did you receive and understand the discharge instructions provided?: Yes Medications obtained,verified, and reconciled?: Yes (Medications Reviewed) Any new allergies since your discharge?: No Dietary orders reviewed?: Yes Type of Diet Ordered:: Heart Healthy Do you have support at home?: Yes People in Home [RPT]: spouse Name of Support/Comfort Primary Source: Dave Mulch  Medications Reviewed Today: Medications Reviewed Today     Reviewed by Moises Reusing, RN (Case Manager) on 06/08/24 at 1351  Med List Status: <None>   Medication Order Taking? Sig Documenting Provider Last Dose Status Informant  acetaminophen  (TYLENOL ) 500 MG tablet [id number] No Take 1,000 mg by mouth in the morning and at bedtime. [provider] More than a month Active Self  albuterol  (VENTOLIN  HFA) 108 (90 Base) MCG/ACT inhaler [id number] No INHALE 2 PUFFS INTO THE LUNGS EVERY 6 HOURS AS NEEDED FOR WHEEZING OR SHORTNESS OF BREATH. Domenica Harlene LABOR, MD 06/04/2024 Morning Active Self  amLODipine  (NORVASC ) 5 MG tablet [id number] No Take 1 tablet (5 mg total) by mouth daily. Domenica Harlene LABOR, MD 06/04/2024  4:00 AM Active Self  apixaban (ELIQUIS) 2.5 MG TABS tablet [id number]  Take 1 tablet (2.5 mg total) by mouth every 12 (twelve) hours. Vernetta Lonni GRADE, MD  Active   benzonatate  (TESSALON ) 100 MG capsule [id number] No TAKE 1  CAPSULE BY MOUTH TWICE DAILY AS NEEDED FOR COUGH Webb, Padonda B, FNP 06/03/2024 Active Self  CALCIUM CARBONATE ANTACID PO [id number] No Take 1,000 mg by mouth in the morning. [provider] 05/28/2024 Active Self  cetirizine (ZYRTEC) 10 MG tablet [id number] No Take 10 mg by mouth in the morning. [provider] 06/04/2024  4:00 AM Active Self  fluticasone  (FLONASE ) 50 MCG/ACT nasal spray [id number] No USE 2 SPRAYS IN EACH NOSTRIL EVERY DAY Domenica Harlene LABOR, MD 06/04/2024  4:00 AM Active Self  GLUCOSAMINE-CHONDROITIN PO [id number]  Take 1 tablet by mouth 2 (two) times daily. [provider]  Active Self  guaiFENesin  (MUCINEX ) 600 MG 12 hr tablet [id number] No Take 600 mg by mouth 2 (two) times daily. [provider] Past Week Active Self  ipratropium (ATROVENT ) 0.06 % nasal spray [id number] No Place 1 spray into both nostrils 4 (four) times daily. Desai, Nikita S, MD Past Week Active Self  loratadine (CLARITIN) 10 MG tablet [id number] No Take 10 mg by mouth every evening. [provider] More than a month Active Self  methocarbamol (ROBAXIN) 500 MG tablet [id number]  Take 1 tablet (500 mg total) by mouth every 6 (six) hours as needed for muscle spasms. Vernetta Lonni GRADE, MD  Active   montelukast  (SINGULAIR ) 10 MG tablet [id number] No TAKE 1 TABLET EVERY DAY Domenica Harlene LABOR, MD 06/03/2024 Active Self  Multiple Vitamin (MULTIVITAMIN WITH MINERALS) TABS tablet [id number] No Take 1 tablet by mouth in the morning. [provider] 05/28/2024 Active Self  oxyCODONE  (OXY IR/ROXICODONE ) 5 MG immediate release tablet [id number]  Take 1-2 tablets (5-10 mg total) by mouth every 6 (six) hours as needed for moderate  pain (pain score 4-6) (pain score 4-6). No more than 6 tablets daily. Vernetta Lonni GRADE, MD  Active   pantoprazole  (PROTONIX ) 20 MG tablet [id number] No Take 1 tablet by mouth once daily  Patient taking differently: Take 20 mg by mouth every evening.    Domenica Harlene LABOR, MD 06/03/2024 Evening Active Self  TRELEGY ELLIPTA  100-62.5-25 MCG/ACT AEPB [id number] No INHALE 1 PUFF INTO THE LUNGS DAILY Domenica Harlene LABOR, MD 06/04/2024  4:00 AM Active Self            Home Care and Equipment/Supplies: Were Home Health Services Ordered?: Yes Name of Home Health Agency:: [facility] Has Agency set up a time to come to your home?: Yes First Home Health Visit Date: 06/08/24 Any new equipment or medical supplies ordered?: Yes Name of Medical supply agency?: [employer] Were you able to get the equipment/medical supplies?: Yes Do you have any questions related to the use of the equipment/supplies?: No  Functional Questionnaire: Do you need assistance with bathing/showering or dressing?: No Do you need assistance with meal preparation?: No Do you need assistance with eating?: No Do you have difficulty maintaining continence: No Do you need assistance with getting out of bed/getting out of a chair/moving?: No Do you have difficulty managing or taking your medications?: No  Follow up appointments reviewed: PCP Follow-up appointment confirmed?: NA Specialist Hospital Follow-up appointment confirmed?: Yes Date of Specialist follow-up appointment?: 06/17/24 Follow-Up Specialty Provider:: Dr. Vernetta Do you need transportation to your follow-up appointment?: No Do you understand care options if your condition(s) worsen?: Yes-patient verbalized understanding  SDOH Interventions Today    Flowsheet Row Most Recent Value  SDOH Interventions   Food Insecurity Interventions Intervention Not Indicated  Housing Interventions Intervention Not Indicated  Transportation Interventions Intervention Not Indicated  Utilities Interventions Intervention Not Indicated    Medford Balboa, BSN, RN [place]  VBCI - [facility] RN Care Manager [phone]

## 2024-06-11 DIAGNOSIS — G894 Chronic pain syndrome: Secondary | ICD-10-CM | POA: Diagnosis not present

## 2024-06-14 ENCOUNTER — Encounter (HOSPITAL_COMMUNITY): Payer: Self-pay | Admitting: *Deleted

## 2024-06-14 ENCOUNTER — Other Ambulatory Visit: Payer: Self-pay

## 2024-06-14 ENCOUNTER — Inpatient Hospital Stay (HOSPITAL_COMMUNITY)
Admission: EM | Admit: 2024-06-14 | Discharge: 2024-06-16 | DRG: 300 | Disposition: A | Discharge status: Home Health | Attending: Internal Medicine | Admitting: Internal Medicine

## 2024-06-14 ENCOUNTER — Emergency Department (EMERGENCY_DEPARTMENT_HOSPITAL)

## 2024-06-14 ENCOUNTER — Emergency Department (HOSPITAL_COMMUNITY)

## 2024-06-14 DIAGNOSIS — Z825 Family history of asthma and other chronic lower respiratory diseases: Secondary | ICD-10-CM | POA: Diagnosis not present

## 2024-06-14 DIAGNOSIS — R1031 Right lower quadrant pain: Secondary | ICD-10-CM | POA: Diagnosis not present

## 2024-06-14 DIAGNOSIS — I824Y2 Acute embolism and thrombosis of unspecified deep veins of left proximal lower extremity: Secondary | ICD-10-CM | POA: Diagnosis not present

## 2024-06-14 DIAGNOSIS — Z888 Allergy status to other drugs, medicaments and biological substances status: Secondary | ICD-10-CM

## 2024-06-14 DIAGNOSIS — Z96651 Presence of right artificial knee joint: Secondary | ICD-10-CM | POA: Diagnosis present

## 2024-06-14 DIAGNOSIS — J929 Pleural plaque without asbestos: Secondary | ICD-10-CM | POA: Diagnosis not present

## 2024-06-14 DIAGNOSIS — Z85828 Personal history of other malignant neoplasm of skin: Secondary | ICD-10-CM

## 2024-06-14 DIAGNOSIS — I82432 Acute embolism and thrombosis of left popliteal vein: Secondary | ICD-10-CM | POA: Diagnosis present

## 2024-06-14 DIAGNOSIS — G8929 Other chronic pain: Secondary | ICD-10-CM | POA: Diagnosis present

## 2024-06-14 DIAGNOSIS — J439 Emphysema, unspecified: Secondary | ICD-10-CM | POA: Diagnosis not present

## 2024-06-14 DIAGNOSIS — I4891 Unspecified atrial fibrillation: Secondary | ICD-10-CM | POA: Diagnosis not present

## 2024-06-14 DIAGNOSIS — Z87891 Personal history of nicotine dependence: Secondary | ICD-10-CM | POA: Diagnosis not present

## 2024-06-14 DIAGNOSIS — Z8249 Family history of ischemic heart disease and other diseases of the circulatory system: Secondary | ICD-10-CM | POA: Diagnosis not present

## 2024-06-14 DIAGNOSIS — I493 Ventricular premature depolarization: Secondary | ICD-10-CM | POA: Diagnosis present

## 2024-06-14 DIAGNOSIS — M7989 Other specified soft tissue disorders: Secondary | ICD-10-CM

## 2024-06-14 DIAGNOSIS — Z886 Allergy status to analgesic agent status: Secondary | ICD-10-CM

## 2024-06-14 DIAGNOSIS — I471 Supraventricular tachycardia, unspecified: Secondary | ICD-10-CM | POA: Diagnosis not present

## 2024-06-14 DIAGNOSIS — Z8042 Family history of malignant neoplasm of prostate: Secondary | ICD-10-CM | POA: Diagnosis not present

## 2024-06-14 DIAGNOSIS — Z96611 Presence of right artificial shoulder joint: Secondary | ICD-10-CM | POA: Diagnosis present

## 2024-06-14 DIAGNOSIS — Z7901 Long term (current) use of anticoagulants: Secondary | ICD-10-CM | POA: Diagnosis not present

## 2024-06-14 DIAGNOSIS — Z23 Encounter for immunization: Secondary | ICD-10-CM

## 2024-06-14 DIAGNOSIS — Z800 Family history of malignant neoplasm of digestive organs: Secondary | ICD-10-CM

## 2024-06-14 DIAGNOSIS — I82409 Acute embolism and thrombosis of unspecified deep veins of unspecified lower extremity: Secondary | ICD-10-CM | POA: Diagnosis present

## 2024-06-14 DIAGNOSIS — K219 Gastro-esophageal reflux disease without esophagitis: Secondary | ICD-10-CM | POA: Diagnosis present

## 2024-06-14 DIAGNOSIS — Z96612 Presence of left artificial shoulder joint: Secondary | ICD-10-CM | POA: Diagnosis present

## 2024-06-14 DIAGNOSIS — Z79899 Other long term (current) drug therapy: Secondary | ICD-10-CM | POA: Diagnosis not present

## 2024-06-14 DIAGNOSIS — K5730 Diverticulosis of large intestine without perforation or abscess without bleeding: Secondary | ICD-10-CM | POA: Diagnosis not present

## 2024-06-14 DIAGNOSIS — Z823 Family history of stroke: Secondary | ICD-10-CM | POA: Diagnosis not present

## 2024-06-14 DIAGNOSIS — R9431 Abnormal electrocardiogram [ECG] [EKG]: Secondary | ICD-10-CM | POA: Diagnosis not present

## 2024-06-14 DIAGNOSIS — R079 Chest pain, unspecified: Secondary | ICD-10-CM | POA: Diagnosis not present

## 2024-06-14 DIAGNOSIS — I10 Essential (primary) hypertension: Secondary | ICD-10-CM | POA: Diagnosis present

## 2024-06-14 DIAGNOSIS — I82412 Acute embolism and thrombosis of left femoral vein: Secondary | ICD-10-CM | POA: Diagnosis not present

## 2024-06-14 DIAGNOSIS — Z902 Acquired absence of lung [part of]: Secondary | ICD-10-CM

## 2024-06-14 LAB — CBC WITH DIFFERENTIAL/PLATELET
Abs Immature Granulocytes: 0.05 K/uL (ref 0.00–0.07)
Basophils Absolute: 0 K/uL (ref 0.0–0.1)
Basophils Relative: 1 %
Eosinophils Absolute: 0.1 K/uL (ref 0.0–0.5)
Eosinophils Relative: 1 %
HCT: 37.5 % — ABNORMAL LOW (ref 39.0–52.0)
Hemoglobin: 12.1 g/dL — ABNORMAL LOW (ref 13.0–17.0)
Immature Granulocytes: 1 %
Lymphocytes Relative: 17 %
Lymphs Abs: 1.1 K/uL (ref 0.7–4.0)
MCH: 32 pg (ref 26.0–34.0)
MCHC: 32.3 g/dL (ref 30.0–36.0)
MCV: 99.2 fL (ref 80.0–100.0)
Monocytes Absolute: 0.6 K/uL (ref 0.1–1.0)
Monocytes Relative: 9 %
Neutro Abs: 4.6 K/uL (ref 1.7–7.7)
Neutrophils Relative %: 71 %
Platelets: 382 K/uL (ref 150–400)
RBC: 3.78 MIL/uL — ABNORMAL LOW (ref 4.22–5.81)
RDW: 12.6 % (ref 11.5–15.5)
WBC: 6.5 K/uL (ref 4.0–10.5)
nRBC: 0 % (ref 0.0–0.2)

## 2024-06-14 LAB — COMPREHENSIVE METABOLIC PANEL WITH GFR
ALT: 24 U/L (ref 0–44)
AST: 26 U/L (ref 15–41)
Albumin: 2.6 g/dL — ABNORMAL LOW (ref 3.5–5.0)
Alkaline Phosphatase: 50 U/L (ref 38–126)
Anion gap: 11 (ref 5–15)
BUN: 11 mg/dL (ref 8–23)
CO2: 21 mmol/L — ABNORMAL LOW (ref 22–32)
Calcium: 8.5 mg/dL — ABNORMAL LOW (ref 8.9–10.3)
Chloride: 106 mmol/L (ref 98–111)
Creatinine, Ser: 0.71 mg/dL (ref 0.61–1.24)
GFR, Estimated: >60 mL/min (ref >60)
Glucose, Bld: 98 mg/dL (ref 70–99)
Potassium: 4.2 mmol/L (ref 3.5–5.1)
Sodium: 138 mmol/L (ref 135–145)
Total Bilirubin: 1.3 mg/dL — ABNORMAL HIGH (ref 0.0–1.2)
Total Protein: 5.9 g/dL — ABNORMAL LOW (ref 6.5–8.1)

## 2024-06-14 LAB — I-STAT CHEM 8, ED
BUN: 13 mg/dL (ref 8–23)
Calcium, Ion: 1.09 mmol/L — ABNORMAL LOW (ref 1.15–1.40)
Chloride: 103 mmol/L (ref 98–111)
Creatinine, Ser: 0.7 mg/dL (ref 0.61–1.24)
Glucose, Bld: 100 mg/dL — ABNORMAL HIGH (ref 70–99)
HCT: 37 % — ABNORMAL LOW (ref 39.0–52.0)
Hemoglobin: 12.6 g/dL — ABNORMAL LOW (ref 13.0–17.0)
Potassium: 4.1 mmol/L (ref 3.5–5.1)
Sodium: 137 mmol/L (ref 135–145)
TCO2: 24 mmol/L (ref 22–32)

## 2024-06-14 LAB — URINALYSIS, ROUTINE W REFLEX MICROSCOPIC
Bilirubin Urine: NEGATIVE
Glucose, UA: NEGATIVE mg/dL
Hgb urine dipstick: NEGATIVE
Ketones, ur: NEGATIVE mg/dL
Leukocytes,Ua: NEGATIVE
Nitrite: NEGATIVE
Protein, ur: NEGATIVE mg/dL
Specific Gravity, Urine: 1.009 (ref 1.005–1.030)
pH: 8 (ref 5.0–8.0)

## 2024-06-14 LAB — TROPONIN I (HIGH SENSITIVITY)
Troponin I (High Sensitivity): 16 ng/L (ref <18)
Troponin I (High Sensitivity): 18 ng/L — ABNORMAL HIGH (ref <18)

## 2024-06-14 LAB — LIPASE, BLOOD: Lipase: 19 U/L (ref 11–51)

## 2024-06-14 LAB — MAGNESIUM: Magnesium: 2 mg/dL (ref 1.7–2.4)

## 2024-06-14 LAB — BRAIN NATRIURETIC PEPTIDE: B Natriuretic Peptide: 196.2 pg/mL — ABNORMAL HIGH (ref 0.0–100.0)

## 2024-06-14 LAB — APTT: aPTT: 41 s — ABNORMAL HIGH (ref 24–36)

## 2024-06-14 LAB — HEPARIN LEVEL (UNFRACTIONATED): Heparin Unfractionated: 0.34 [IU]/mL (ref 0.30–0.70)

## 2024-06-14 MED ORDER — DILTIAZEM HCL-DEXTROSE 125-5 MG/125ML-% IV SOLN (PREMIX)
5.0000 mg/h | INTRAVENOUS | Status: DC
  Administered 2024-06-14: 5 mg/h via INTRAVENOUS
  Filled 2024-06-14: qty 125

## 2024-06-14 MED ORDER — HEPARIN BOLUS VIA INFUSION
2000.0000 [IU] | Freq: Once | INTRAVENOUS | Status: AC
  Administered 2024-06-14: 2000 [IU] via INTRAVENOUS
  Filled 2024-06-14: qty 2000

## 2024-06-14 MED ORDER — OXYCODONE HCL 5 MG PO TABS: 5.0000 mg | ORAL_TABLET | ORAL | Status: DC | PRN

## 2024-06-14 MED ORDER — MORPHINE SULFATE (PF) 4 MG/ML IV SOLN
4.0000 mg | Freq: Once | INTRAVENOUS | Status: AC
  Administered 2024-06-14: 4 mg via INTRAVENOUS
  Filled 2024-06-14: qty 1

## 2024-06-14 MED ORDER — IOHEXOL 350 MG/ML SOLN
75.0000 mL | Freq: Once | INTRAVENOUS | Status: AC | PRN
  Administered 2024-06-14: 75 mL via INTRAVENOUS

## 2024-06-14 MED ORDER — OXYCODONE HCL 5 MG PO TABS
10.0000 mg | ORAL_TABLET | ORAL | Status: DC | PRN
  Administered 2024-06-14: 10 mg via ORAL
  Filled 2024-06-14: qty 2

## 2024-06-14 MED ORDER — IPRATROPIUM-ALBUTEROL 0.5-2.5 (3) MG/3ML IN SOLN
3.0000 mL | Freq: Four times a day (QID) | RESPIRATORY_TRACT | Status: DC | PRN
  Administered 2024-06-14: 3 mL via RESPIRATORY_TRACT
  Filled 2024-06-14: qty 3

## 2024-06-14 MED ORDER — DILTIAZEM LOAD VIA INFUSION
15.0000 mg | Freq: Once | INTRAVENOUS | Status: AC
  Administered 2024-06-14: 15 mg via INTRAVENOUS
  Filled 2024-06-14: qty 15

## 2024-06-14 MED ORDER — DILTIAZEM HCL 60 MG PO TABS
60.0000 mg | ORAL_TABLET | Freq: Four times a day (QID) | ORAL | Status: DC
  Administered 2024-06-14 – 2024-06-16 (×7): 60 mg via ORAL
  Filled 2024-06-14 (×8): qty 1

## 2024-06-14 MED ORDER — OXYCODONE HCL 5 MG PO TABS
5.0000 mg | ORAL_TABLET | ORAL | Status: DC | PRN
Start: 2024-06-14 — End: 2024-06-16
  Administered 2024-06-15 – 2024-06-16 (×3): 5 mg via ORAL
  Filled 2024-06-14 (×3): qty 1

## 2024-06-14 MED ORDER — AMLODIPINE BESYLATE 5 MG PO TABS
5.0000 mg | ORAL_TABLET | Freq: Every day | ORAL | Status: DC
  Administered 2024-06-14: 5 mg via ORAL
  Filled 2024-06-14: qty 1

## 2024-06-14 MED ORDER — HEPARIN (PORCINE) 25000 UT/250ML-% IV SOLN
1450.0000 [IU]/h | INTRAVENOUS | Status: DC
  Administered 2024-06-14: 1100 [IU]/h via INTRAVENOUS
  Filled 2024-06-14: qty 250

## 2024-06-14 MED ORDER — BUDESON-GLYCOPYRROL-FORMOTEROL 160-9-4.8 MCG/ACT IN AERO
2.0000 | INHALATION_SPRAY | Freq: Two times a day (BID) | RESPIRATORY_TRACT | Status: DC
  Administered 2024-06-14 – 2024-06-16 (×3): 2 via RESPIRATORY_TRACT
  Filled 2024-06-14 (×2): qty 5.9

## 2024-06-14 MED ORDER — DILTIAZEM HCL ER COATED BEADS 180 MG PO CP24: 180.0000 mg | ORAL_CAPSULE | Freq: Every day | ORAL | Status: DC

## 2024-06-14 NOTE — Consult Note (Addendum)
 Cardiology Consultation   Patient ID: GLYNN YEPES MRN: [id number]; DOB: 1946/06/20  Admit date: 06/14/2024 Date of Consult: 06/14/2024  PCP:  Domenica Harlene LABOR, MD   [facility] Providers Cardiologist:  Maude Emmer, MD        Patient Profile: FRANCISCOJAVIER WRONSKI is a 78 y.o. male with a history of frequent PVCs noted on monitor in 2015, hypertension, COPD, lung abscess in 1996 s/p right middle and lower lobe resection, and back pain who is being seen 06/14/2024 for the evaluation of atrial fibrillation at the request of Dr. Georgina.  History of Present Illness: Mr. Shovlin is a 78 year old male with the above history who is followed by Dr. Emmer.  He is primarily followed for frequent PVCs noted on remote monitor in 2015 and hypertension.  Last Echo in 02/2023  showed LVEF of 65 to 65% with grade 1 diastolic dysfunction, normal RV function, and no significant valvular disease.   He was recently seen in the ED at [facility] in 04/2024 for further evaluation of chest pain and leg swelling.  He reported 2 separate episodes of sharp nonradiating chest pain that lasted 8 to 10 minutes (one during rest and one during exertion).  Per report, EKG showed sinus tachycardia rate 104 bpm with frequent PVCs but no acute ischemic changes (unable to personally review this). High-sensitivity troponin negative x2. D-dimer was positive but CTA was negative for PE. He was felt to be stable for discharge.  He was recently seen by Orren Fabry, PA-C, on 05/06/2024 for preop evaluation for upcoming right knee replacement. At that visit, he denied any recurrent chest pain or worsening shortness of breath and was felt to be at acceptable risk for surgery. He underwent sugery on 06/04/2024 and then was discharge don 06/07/2024.  Patient presents today to the ED for further evaluation of abdominal pain.  EKG showed questionable atrial fibrillation, rate 145 bpm, with incomplete RBBB and occasional PVCs  but no acute ischemic changes. High-sensitivity troponin 16 >> 18. BNP mildly elevated 196. CTA was negative for PE and abdominal/ pelvic CT showed no acute findings. WBC 6.5, Hgb 12.1, Plts 382. Na 138, K 4.2, Glucose 98, BUN 11, Cr 0.71. Albumin 2.6, AST 26, ALT 24, Alk Phos 50, Total Bili 1.3. Magnesium 2.0. Lipase normal. Urinalysis negative. Lower extreimty venous doppler showed acute DVT of left common femoral vein, SF junction, left femoral vein, left proximal profunda vein, and left popliteal vein. He was started on IV Heparin and IV Cardizem. Cardiology consulted for further evaluation of atrial fibrillation.  Patient states he started having sharp lower abdominal pain whenever he moved his right leg.  He reports previously having a hernia repair so was wondering whether this could be the cause.  He also has had significant right leg swelling up to his groin since surgery.  He initially denied any chest pain but then when asked about this again and then the visit he reports having some intermittent chest discomfort that he has trouble describing. He initially thought it may just be indigestion. He states it may be with exertion but he is not sure.  However, he states he has not had any chest discomfort since his surgery.  He has chronic shortness of breath due to his COPD and prior lung resection. However, he states this is stable. No orthopnea or PND. He has significant right leg swelling. He denies any palpitations and states he has only had slight dizziness. No syncope. No abnormal  bleeding in urine or stools.   Past Medical History:  Diagnosis Date   Allergic rhinitis    Atypical chest pain 12/01/2014   Back pain 01/12/2013   COPD (chronic obstructive pulmonary disease) (HCC)    FeV1 64%-2007   Dyspnea    Dysrhythmia    PVCs   Emphysema    GERD (gastroesophageal reflux disease)    H/O total shoulder replacement, left 2024   H/O total shoulder replacement, right 2023   History of lung  abscess    bronchiectasis with RMLandRLL ersection -1996- Dr Brantley   Hordeolum externum (stye) 04/23/2015   Right eye   Hypertension    Impaired vision    glasses   Osteoarthritis    hands and knees   Pneumonia    Sinusitis, acute 04/23/2015   Skin cancer    Right eye area    Past Surgical History:  Procedure Laterality Date   COLONOSCOPY  11/20/2010   Dr.Stark   ESOPHAGOGASTRODUODENOSCOPY (EGD) WITH PROPOFOL  N/A 10/31/2021   Procedure: ESOPHAGOGASTRODUODENOSCOPY (EGD) WITH PROPOFOL ;  Surgeon: Avram Lupita BRAVO, MD;  Location: [facility] ENDOSCOPY;  Service: Gastroenterology;  Laterality: N/A;   HEMOSTASIS CONTROL  10/31/2021   Procedure: HEMOSTASIS CONTROL;  Surgeon: Avram Lupita BRAVO, MD;  Location: [facility] ENDOSCOPY;  Service: Gastroenterology;;   HERNIA REPAIR  10/03/2009   HOT HEMOSTASIS N/A 10/31/2021   Procedure: HOT HEMOSTASIS (ARGON PLASMA COAGULATION/BICAP);  Surgeon: Avram Lupita BRAVO, MD;  Location: [facility] ENDOSCOPY;  Service: Gastroenterology;  Laterality: N/A;   left knee     lower back surgery  09/02/2008   06/2009   LUNG SURGERY     RML andRUL removed due to bleeding and bronchiectasis and lung abcess   MOUTH SURGERY     teeth extraction   NECK SURGERY  05/03/2008   REVERSE SHOULDER ARTHROPLASTY Right 08/22/2022   Procedure: RIGHT REVERSE SHOULDER ARTHROPLASTY;  Surgeon: Genelle Standing, MD;  Location: [facility] OR;  Service: Orthopedics;  Laterality: Right;   REVERSE SHOULDER ARTHROPLASTY Left 08/05/2023   Procedure: LEFT REVERSE SHOULDER ARTHROPLASTY;  Surgeon: Genelle Standing, MD;  Location: [facility];  Service: Orthopedics;  Laterality: Left;   right foot surgery     SCLEROTHERAPY  10/31/2021   Procedure: SCLEROTHERAPY;  Surgeon: Avram Lupita BRAVO, MD;  Location: [facility] ENDOSCOPY;  Service: Gastroenterology;;   TONSILLECTOMY     removed as a child   TOTAL KNEE ARTHROPLASTY Right 06/04/2024   Procedure: ARTHROPLASTY, KNEE, TOTAL;  Surgeon: Vernetta Lonni GRADE, MD;   Location: [facility] ORS;  Service: Orthopedics;  Laterality: Right;   VIDEO BRONCHOSCOPY N/A 07/12/2020   Procedure: VIDEO BRONCHOSCOPY;  Surgeon: Kerrin Standing BROCKS, MD;  Location: [facility] OR;  Service: Thoracic;  Laterality: N/A;   VIDEO BRONCHOSCOPY WITH ENDOBRONCHIAL NAVIGATION N/A 07/12/2020   Procedure: VIDEO BRONCHOSCOPY WITH ENDOBRONCHIAL NAVIGATION;  Surgeon: Kerrin Standing BROCKS, MD;  Location: [facility] OR;  Service: Thoracic;  Laterality: N/A;     Home Medications:  Prior to Admission medications   Medication Sig Start Date End Date Taking? Authorizing Provider  acetaminophen  (TYLENOL ) 500 MG tablet Take 1,000 mg by mouth in the morning and at bedtime.    [provider]  albuterol  (VENTOLIN  HFA) 108 (90 Base) MCG/ACT inhaler INHALE 2 PUFFS INTO THE LUNGS EVERY 6 HOURS AS NEEDED FOR WHEEZING OR SHORTNESS OF BREATH. 08/02/23   Domenica Harlene LABOR, MD  amLODipine  (NORVASC ) 5 MG tablet Take 1 tablet (5 mg total) by mouth daily. 05/06/24   Domenica Harlene LABOR, MD  apixaban WINN)  2.5 MG TABS tablet Take 1 tablet (2.5 mg total) by mouth every 12 (twelve) hours. 06/05/24   Vernetta Lonni GRADE, MD  benzonatate  (TESSALON ) 100 MG capsule TAKE 1 CAPSULE BY MOUTH TWICE DAILY AS NEEDED FOR COUGH 09/05/23   Webb, Padonda B, FNP  CALCIUM CARBONATE ANTACID PO Take 1,000 mg by mouth in the morning.    [provider]  cetirizine (ZYRTEC) 10 MG tablet Take 10 mg by mouth in the morning.    [provider]  fluticasone  (FLONASE ) 50 MCG/ACT nasal spray USE 2 SPRAYS IN EACH NOSTRIL EVERY DAY 12/05/23   Domenica Harlene LABOR, MD  GLUCOSAMINE-CHONDROITIN PO Take 1 tablet by mouth 2 (two) times daily.    [provider]  guaiFENesin  (MUCINEX ) 600 MG 12 hr tablet Take 600 mg by mouth 2 (two) times daily.    [provider]  ipratropium (ATROVENT ) 0.06 % nasal spray Place 1 spray into both nostrils 4 (four) times daily. 12/16/23   Desai, Nikita S, MD  loratadine (CLARITIN) 10 MG tablet Take 10  mg by mouth every evening.    [provider]  methocarbamol (ROBAXIN) 500 MG tablet Take 1 tablet (500 mg total) by mouth every 6 (six) hours as needed for muscle spasms. 06/05/24   Vernetta Lonni GRADE, MD  montelukast  (SINGULAIR ) 10 MG tablet TAKE 1 TABLET EVERY DAY 11/26/23   Domenica Harlene LABOR, MD  Multiple Vitamin (MULTIVITAMIN WITH MINERALS) TABS tablet Take 1 tablet by mouth in the morning.    [provider]  oxyCODONE  (OXY IR/ROXICODONE ) 5 MG immediate release tablet Take 1-2 tablets (5-10 mg total) by mouth every 6 (six) hours as needed for moderate pain (pain score 4-6) (pain score 4-6). No more than 6 tablets daily. 06/05/24   Vernetta Lonni GRADE, MD  pantoprazole  (PROTONIX ) 20 MG tablet Take 1 tablet by mouth once daily Patient taking differently: Take 20 mg by mouth every evening. 01/12/24   Domenica Harlene LABOR, MD  TRELEGY ELLIPTA  100-62.5-25 MCG/ACT AEPB INHALE 1 PUFF INTO THE LUNGS DAILY 03/02/24   Domenica Harlene LABOR, MD    Scheduled Meds:  budesonide -glycopyrrolate-formoterol   2 puff Inhalation BID   Continuous Infusions:  diltiazem (CARDIZEM) infusion 5 mg/hr (06/14/24 1249)   heparin 1,100 Units/hr (06/14/24 1253)   PRN Meds: ipratropium-albuterol , oxyCODONE  **OR** oxyCODONE   Allergies:    Allergies  Allergen Reactions   Aspirin      Stomach bleeding    Nsaids     Do not give to patient due to hx of stomach bleeds    Losartan  Other (See Comments)    weakness   Meloxicam      Caused Stomach Bleeds     Social History:   Social History   Socioeconomic History   Marital status: Married    Spouse name: Darcee   Number of children: Not on file   Years of education: Not on file   Highest education level: Bachelor's degree (e.g., BA, AB, BS)  Occupational History   Occupation: retired Theatre manager  Tobacco Use   Smoking status: Former    Current packs/day: 0.00    Average packs/day: 1.5 packs/day for 38.0 years (57.0 ttl pk-yrs)    Types:  Cigarettes    Start date: 09/03/1962    Quit date: 09/03/2000    Years since quitting: 23.7    Passive exposure: Past   Smokeless tobacco: Never  Vaping Use   Vaping status: Never Used  Substance and Sexual Activity   Alcohol use: Yes  Alcohol/week: 1.0 standard drink of alcohol    Types: 1 Cans of beer per week    Comment: 3 or 4 nights weekly   Drug use: No   Sexual activity: Never  Other Topics Concern   Not on file  Social History Narrative   Retired Secretary/administrator   Patient states former smoker. 1 1/2 ppd x 38 yrs  Quit in Jan 2002   Married - 2 weeks (4th marriage)   divorced,  remarried 84-2004 (lost wife to lung ca),  remarried (divorced),    1 son  - 49 Josh)   Alcohol use-yes (2-3 beers per week)           Social Drivers of Health   Financial Resource Strain: Low Risk  (05/07/2024)   Overall Financial Resource Strain (CARDIA)    Difficulty of Paying Living Expenses: Not hard at all  Food Insecurity: No Food Insecurity (06/08/2024)   Hunger Vital Sign    Worried About Running Out of Food in the Last Year: Never true    Ran Out of Food in the Last Year: Never true  Transportation Needs: No Transportation Needs (06/08/2024)   PRAPARE - Administrator, Civil Service (Medical): No    Lack of Transportation (Non-Medical): No  Physical Activity: Insufficiently Active (05/07/2024)   Exercise Vital Sign    Days of Exercise per Week: 6 days    Minutes of Exercise per Session: 20 min  Stress: No Stress Concern Present (05/07/2024)   [employer] of Occupational Health - Occupational Stress Questionnaire    Feeling of Stress: Not at all  Social Connections: Socially Integrated (06/04/2024)   Social Connection and Isolation Panel    Frequency of Communication with Friends and Family: Twice a week    Frequency of Social Gatherings with Friends and Family: Once a week    Attends Religious Services: More than 4 times per year    Active Member of [employer] or  Organizations: Yes    Attends Engineer, structural: More than 4 times per year    Marital Status: Married  Catering manager Violence: Not At Risk (06/08/2024)   Humiliation, Afraid, Rape, and Kick questionnaire    Fear of Current or Ex-Partner: No    Emotionally Abused: No    Physically Abused: No    Sexually Abused: No    Family History:   Family History  Problem Relation Age of Onset   Heart failure Mother        age 15   COPD Mother    Prostate cancer Father        father died prostate ca   Obstructive Sleep Apnea Brother    Obesity Brother    Colon cancer Paternal Grandmother    Healthy Son    Coronary artery disease Other        1st degree relative<60   Stroke Other        1st degree relative<50   Esophageal cancer Neg Hx    Stomach cancer Neg Hx      ROS:  Please see the history of present illness.     Physical Exam/Data: Vitals:   06/14/24 1300 06/14/24 1308 06/14/24 1430 06/14/24 1726  BP: 122/75 133/71 134/69   Pulse:  (!) 101 91   Resp: 14 17 (!) 27   Temp:  98.3 F (36.8 C)  98.3 F (36.8 C)  TempSrc:  Oral  Oral  SpO2:  100% 99%   Weight:  Height:        Intake/Output Summary (Last 24 hours) at 06/14/2024 1732 Last data filed at 06/14/2024 1117 Gross per 24 hour  Intake --  Output 300 ml  Net -300 ml      06/14/2024    9:22 AM 06/08/2024    1:56 PM 06/04/2024    7:06 AM  Last 3 Weights  Weight (lbs) 148 lb 145 lb 145 lb 8.1 oz  Weight (kg) 67.132 kg 65.772 kg 66 kg     Body mass index is 23.18 kg/m.  General: 78 y.o. male resting comfortably in no acute distress. HEENT: Normocephalic and atraumatic. Sclera clear.  Neck: Supple. No JVD. Heart: Mildly tachycardic with irregularly irregular rhythm. No murmurs, gallops, or rubs.  Lungs: No increased work of breathing.  Decreased breath sounds on the right.  No wheezes, rhonchi, or rales.  Extremities: 3+ pitting edema of right lower extremity.  Skin: Warm and dry. Neuro: Alert  and oriented x3. No focal deficits. Psych: Normal affect. Responds appropriately.  EKG:  The EKG was personally reviewed and demonstrates:  Questionable atrial fibrillation vs sinus rhythm with run of PAT/ SVT and PVCs. Rate 145 bpm. Telemetry:  Telemetry was personally reviewed and demonstrates:  Questionable atrial fibrillation vs sinus rhythm with frequent PVC and runs of PAT/ SVT.  Relevant CV Studies:  Echocardiogram 02/20/2023: Impressions: 1. Left ventricular ejection fraction, by estimation, is 60 to 65%. The  left ventricle has normal function. The left ventricle has no regional  wall motion abnormalities. Left ventricular diastolic parameters are  consistent with Grade I diastolic  dysfunction (impaired relaxation).   2. Right ventricular systolic function is normal. The right ventricular  size is normal.   3. The mitral valve is normal in structure. No evidence of mitral valve  regurgitation. No evidence of mitral stenosis.   4. The aortic valve is normal in structure. Aortic valve regurgitation is  not visualized. No aortic stenosis is present.   5. The inferior vena cava is normal in size with greater than 50%  respiratory variability, suggesting right atrial pressure of 3 mmHg.    Laboratory Data: High Sensitivity Troponin:   Recent Labs  Lab 06/14/24 0912 06/14/24 1218  TROPONINIHS 16 18*     Chemistry Recent Labs  Lab 06/14/24 0912 06/14/24 1003  NA 138 137  K 4.2 4.1  CL 106 103  CO2 21*  --   GLUCOSE 98 100*  BUN 11 13  CREATININE 0.71 0.70  CALCIUM 8.5*  --   MG 2.0  --   GFRNONAA >60  --   ANIONGAP 11  --     Recent Labs  Lab 06/14/24 0912  PROT 5.9*  ALBUMIN 2.6*  AST 26  ALT 24  ALKPHOS 50  BILITOT 1.3*   Lipids No results for input(s): CHOL, TRIG, HDL, LABVLDL, LDLCALC, CHOLHDL in the last 168 hours.  Hematology Recent Labs  Lab 06/14/24 0912 06/14/24 1003  WBC 6.5  --   RBC 3.78*  --   HGB 12.1* 12.6*  HCT 37.5*  37.0*  MCV 99.2  --   MCH 32.0  --   MCHC 32.3  --   RDW 12.6  --   PLT 382  --    Thyroid  No results for input(s): TSH, FREET4 in the last 168 hours.  BNP Recent Labs  Lab 06/14/24 0912  BNP 196.2*    DDimer No results for input(s): DDIMER in the last 168 hours.  Assessment and Plan:  Atrial Fibrillation Frequent PVCs Patient has a history of frequent PVCs noted on prior monitor in 2015.  He presented with abdominal pain and was noted to be tachycardic with rates initially in the 140s.   EKG shows a narrow complex tachycardia that is slightly irregular with occasional PVCs and a couple of clear sinus beats. Telemetry shows periods where he appears to be in A-fib but now back in sinus with frequent ectopy and short atrial runs. He was started on IV Cardizem and IV Heparin. - Currently rates in the 90s to low 100s. When I was leaving the room, he was clearly in sinus rhythm with PACs/ PVCs. - Potassium 4.2 and Magnesium 2.0.  - Will check TSH.  - Will update Echo. - Will stop IV Cardizem and switch to PO Cardizem 60 mg every 6 hours. If EF is low, will need to switch to beta-blocker.  - CHA2DS2-VASc = 3 (HTN, age x2). Currently on IV Heparin. Can switch to DOAC when okay with primary team (will need treatment dose for DVT). Consider outpatient monitor for further assessment of PVC burden and atrial arrhythmias.   Chest Pain Patient reports history of chest discomfort recently that he really has difficult describing. EKG shows no acute ischemic changes. High-sensitivity troponin 12 >> 18 consistent with demand ischemic.  - Currently chest pain free.  - Will get Echo as above. - Can consider outpatient ischemic evaluation unless Echo shows newly reduced EF or wall motion abnormalities.   Hypertension BP elevated. Systolic BP currently in the 170s. Some of this may be due to pain.  - Currently on Amlodipine  and IV Cardizme.  - Will stop Amlodipine  and switch to PO Cardizem  -  Can continue to adjust medication as needed.  Acute DVT Patient has significant right lower extremity edema after recent right knee replacement. However, lower extremity venous dopplers showed acute DVT on the left not the right.  - Currently on IV Heparin. Will need DOAC prior to discharge. Will defer timing of this to primary team.   Otherwise, per primary team: - Abdominal pain - Recent right knee replacement - COPD  - Chronic back pain    Risk Assessment/Risk Scores:   CHA2DS2-VASc Score = 4   This indicates a 4.8% annual risk of stroke. The patient's score is based upon: CHF History: 0 HTN History: 1 Diabetes History: 0 Stroke History: 0 Vascular Disease History: 1 Age Score: 2 Gender Score: 0    For questions or updates, please contact [facility] Please consult [email or web address] for contact info under      Signed, Callie E Goodrich, PA-C  06/14/2024 5:32 PM  Patient seen and examined.  Agree with below documentation.  Mr. Finken is a 78 year old male with a history of frequent PVCs, COPD, lung abscess status post right middle and lower lobe resection who we are consulted for evaluation of atrial fibrillation at the request of Dr. Georgina.  Most recent echo in 02/2023 showed normal biventricular function, no significant valvular disease.  He underwent right knee replacement on 10/3.  He presented to the ED today for evaluation of abdominal pain.  Noted to have significantly elevated heart rates.  EKG shows sinus rhythm with frequent PACs and atrial runs.  Subsequently on telemetry appeared to go into atrial fibrillation with RVR.  He is back in sinus rhythm with frequent ectopy again.  CTPA showed no PE.  However lower extremity duplex showed acute DVT.  Initial vitals notable for pulse 117, BP 173/117,  SpO2 100% on room air.  Labs notable for sodium 138, creatinine 0.71, BNP 196, hemoglobin 12.1, platelets 382, BNP 196, troponin 16>18.  On exam, patient is alert and  oriented, irregular rhythm, tachycardic, no murmurs, diminished breath sounds, 2+ right lower extremity edema.  For his atrial fibrillation, this is a new diagnosis.  CHA2DS2-VASc score is 3 (age x 2, hypertension).  He has been started on heparin drip.  Can transition to DOAC.  He is currently out of atrial fibrillation and is in sinus rhythm with frequent PACs/PVCs.  He has been started on diltiazem drip.  Will transition to p.o. diltiazem, will start with 60 mg every 6 hours.  Will check echocardiogram to evaluate systolic function.  Lonni LITTIE Nanas, MD

## 2024-06-14 NOTE — H&P (Addendum)
 History and Physical    Patient: [name] FMW:[id number] DOB: 09/25/1945 DOA: 06/14/2024 DOS: the patient was seen and examined on 06/14/2024 PCP: Domenica Harlene LABOR, MD  Patient coming from: Home  Chief Complaint:  Chief Complaint  Patient presents with   Abdominal Pain   HPI: [name] is a 78 y.o. male with medical history significant of OA, DDD (cervical and lumbar spine), COPD, HTN, GERD, and recent R TKR on 10/3 p/w acute LLE DVT and new onset AFRVR.  The patient presented with abdominal pain that has been progressively worsening. The pain is located in the abdomen and reportedly feels like a knife twisting whenever the patient moves the right leg. This pain began after knee surgery on October 3rd, and the patient had a history of abdominal surgery 14 years ago for two hernias. The patient had been pain-free for years until this recent exacerbation. While undergoing evaluation in the ED, pt noted to be in new onset Afib and was noted to have acute LLE DVT.  In the ED, pt tachycardic and tachypneic. Labs unremarkable. CTA chest PE protocol neg for acute PE, but BLE US  did show acute LLE DVT. EDP started heparin gtt and requested medicine admission.   Review of Systems: As mentioned in the history of present illness. All other systems reviewed and are negative. Past Medical History:  Diagnosis Date   Allergic rhinitis    Atypical chest pain 12/01/2014   Back pain 01/12/2013   COPD (chronic obstructive pulmonary disease) (HCC)    FeV1 64%-2007   Dyspnea    Dysrhythmia    PVCs   Emphysema    GERD (gastroesophageal reflux disease)    H/O total shoulder replacement, left 2024   H/O total shoulder replacement, right 2023   History of lung abscess    bronchiectasis with RMLandRLL ersection -1996- Dr Brantley   Hordeolum externum (stye) 04/23/2015   Right eye   Hypertension    Impaired vision    glasses   Osteoarthritis    hands and knees   Pneumonia    Sinusitis,  acute 04/23/2015   Skin cancer    Right eye area   Past Surgical History:  Procedure Laterality Date   COLONOSCOPY  11/20/2010   Dr.Stark   ESOPHAGOGASTRODUODENOSCOPY (EGD) WITH PROPOFOL  N/A 10/31/2021   Procedure: ESOPHAGOGASTRODUODENOSCOPY (EGD) WITH PROPOFOL ;  Surgeon: Avram Lupita BRAVO, MD;  Location: [facility] ENDOSCOPY;  Service: Gastroenterology;  Laterality: N/A;   HEMOSTASIS CONTROL  10/31/2021   Procedure: HEMOSTASIS CONTROL;  Surgeon: Avram Lupita BRAVO, MD;  Location: [facility] ENDOSCOPY;  Service: Gastroenterology;;   HERNIA REPAIR  10/03/2009   HOT HEMOSTASIS N/A 10/31/2021   Procedure: HOT HEMOSTASIS (ARGON PLASMA COAGULATION/BICAP);  Surgeon: Avram Lupita BRAVO, MD;  Location: [facility] ENDOSCOPY;  Service: Gastroenterology;  Laterality: N/A;   left knee     lower back surgery  09/02/2008   06/2009   LUNG SURGERY     RML andRUL removed due to bleeding and bronchiectasis and lung abcess   MOUTH SURGERY     teeth extraction   NECK SURGERY  05/03/2008   REVERSE SHOULDER ARTHROPLASTY Right 08/22/2022   Procedure: RIGHT REVERSE SHOULDER ARTHROPLASTY;  Surgeon: Genelle Standing, MD;  Location: [facility] OR;  Service: Orthopedics;  Laterality: Right;   REVERSE SHOULDER ARTHROPLASTY Left 08/05/2023   Procedure: LEFT REVERSE SHOULDER ARTHROPLASTY;  Surgeon: Genelle Standing, MD;  Location: [facility];  Service: Orthopedics;  Laterality: Left;   right foot surgery     SCLEROTHERAPY  10/31/2021   Procedure: MATIAS;  Surgeon: Avram Lupita BRAVO, MD;  Location: [facility] ENDOSCOPY;  Service: Gastroenterology;;   TONSILLECTOMY     removed as a child   TOTAL KNEE ARTHROPLASTY Right 06/04/2024   Procedure: ARTHROPLASTY, KNEE, TOTAL;  Surgeon: Vernetta Lonni GRADE, MD;  Location: [facility] ORS;  Service: Orthopedics;  Laterality: Right;   VIDEO BRONCHOSCOPY N/A 07/12/2020   Procedure: VIDEO BRONCHOSCOPY;  Surgeon: Kerrin Elspeth BROCKS, MD;  Location: [facility] OR;  Service: Thoracic;  Laterality: N/A;   VIDEO  BRONCHOSCOPY WITH ENDOBRONCHIAL NAVIGATION N/A 07/12/2020   Procedure: VIDEO BRONCHOSCOPY WITH ENDOBRONCHIAL NAVIGATION;  Surgeon: Kerrin Elspeth BROCKS, MD;  Location: [facility] OR;  Service: Thoracic;  Laterality: N/A;   Social History:  reports that he quit smoking about 23 years ago. His smoking use included cigarettes. He started smoking about 61 years ago. He has a 57 pack-year smoking history. He has been exposed to tobacco smoke. He has never used smokeless tobacco. He reports current alcohol use of about 1.0 standard drink of alcohol per week. He reports that he does not use drugs.  Allergies  Allergen Reactions   Aspirin      Stomach bleeding    Nsaids     Do not give to patient due to hx of stomach bleeds    Losartan  Other (See Comments)    weakness   Meloxicam      Caused Stomach Bleeds     Family History  Problem Relation Age of Onset   Heart failure Mother        age 55   COPD Mother    Prostate cancer Father        father died prostate ca   Obstructive Sleep Apnea Brother    Obesity Brother    Colon cancer Paternal Grandmother    Healthy Son    Coronary artery disease Other        1st degree relative<60   Stroke Other        1st degree relative<50   Esophageal cancer Neg Hx    Stomach cancer Neg Hx     Prior to Admission medications   Medication Sig Start Date End Date Taking? Authorizing Provider  acetaminophen  (TYLENOL ) 500 MG tablet Take 1,000 mg by mouth in the morning and at bedtime.    [provider]  albuterol  (VENTOLIN  HFA) 108 (90 Base) MCG/ACT inhaler INHALE 2 PUFFS INTO THE LUNGS EVERY 6 HOURS AS NEEDED FOR WHEEZING OR SHORTNESS OF BREATH. 08/02/23   Domenica Harlene LABOR, MD  amLODipine  (NORVASC ) 5 MG tablet Take 1 tablet (5 mg total) by mouth daily. 05/06/24   Domenica Harlene LABOR, MD  apixaban (ELIQUIS) 2.5 MG TABS tablet Take 1 tablet (2.5 mg total) by mouth every 12 (twelve) hours. 06/05/24   Vernetta Lonni GRADE, MD  benzonatate  (TESSALON ) 100 MG  capsule TAKE 1 CAPSULE BY MOUTH TWICE DAILY AS NEEDED FOR COUGH 09/05/23   Webb, Padonda B, FNP  CALCIUM CARBONATE ANTACID PO Take 1,000 mg by mouth in the morning.    [provider]  cetirizine (ZYRTEC) 10 MG tablet Take 10 mg by mouth in the morning.    [provider]  fluticasone  (FLONASE ) 50 MCG/ACT nasal spray USE 2 SPRAYS IN EACH NOSTRIL EVERY DAY 12/05/23   Domenica Harlene LABOR, MD  GLUCOSAMINE-CHONDROITIN PO Take 1 tablet by mouth 2 (two) times daily.    [provider]  guaiFENesin  (MUCINEX ) 600 MG 12 hr tablet Take 600 mg by mouth 2 (two) times daily.  [provider]  ipratropium (ATROVENT ) 0.06 % nasal spray Place 1 spray into both nostrils 4 (four) times daily. 12/16/23   Desai, Nikita S, MD  loratadine (CLARITIN) 10 MG tablet Take 10 mg by mouth every evening.    [provider]  methocarbamol (ROBAXIN) 500 MG tablet Take 1 tablet (500 mg total) by mouth every 6 (six) hours as needed for muscle spasms. 06/05/24   Vernetta Lonni GRADE, MD  montelukast  (SINGULAIR ) 10 MG tablet TAKE 1 TABLET EVERY DAY 11/26/23   Domenica Harlene LABOR, MD  Multiple Vitamin (MULTIVITAMIN WITH MINERALS) TABS tablet Take 1 tablet by mouth in the morning.    [provider]  oxyCODONE  (OXY IR/ROXICODONE ) 5 MG immediate release tablet Take 1-2 tablets (5-10 mg total) by mouth every 6 (six) hours as needed for moderate pain (pain score 4-6) (pain score 4-6). No more than 6 tablets daily. 06/05/24   Vernetta Lonni GRADE, MD  pantoprazole  (PROTONIX ) 20 MG tablet Take 1 tablet by mouth once daily Patient taking differently: Take 20 mg by mouth every evening. 01/12/24   Domenica Harlene LABOR, MD  TRELEGY ELLIPTA  100-62.5-25 MCG/ACT AEPB INHALE 1 PUFF INTO THE LUNGS DAILY 03/02/24   Domenica Harlene LABOR, MD    Physical Exam: Vitals:   06/14/24 0921 06/14/24 0922 06/14/24 1300 06/14/24 1308  BP: (!) 173/117  122/75 133/71  Pulse: (!) 117   (!) 101  Resp: 20  14 17   Temp: 98.5 F  (36.9 C)   98.3 F (36.8 C)  TempSrc: Oral   Oral  SpO2: 100%   100%  Weight:  67.1 kg    Height:  5' 7 (1.702 m)     General: Alert, oriented x3, resting comfortably in no acute distress Respiratory: Lungs clear to auscultation bilaterally with normal respiratory effort; no w/r/r Cardiovascular: Irregularly irregular Abdomen: Soft, nontender, nondistended. Positive bowel sounds MSK: BLE swelling (R>L)   Data Reviewed:  Lab Results  Component Value Date   WBC 6.5 06/14/2024   HGB 12.6 (L) 06/14/2024   HCT 37.0 (L) 06/14/2024   MCV 99.2 06/14/2024   PLT 382 06/14/2024   Lab Results  Component Value Date   GLUCOSE 100 (H) 06/14/2024   CALCIUM 8.5 (L) 06/14/2024   NA 137 06/14/2024   K 4.1 06/14/2024   CO2 21 (L) 06/14/2024   CL 103 06/14/2024   BUN 13 06/14/2024   CREATININE 0.70 06/14/2024   Lab Results  Component Value Date   ALT 24 06/14/2024   AST 26 06/14/2024   ALKPHOS 50 06/14/2024   BILITOT 1.3 (H) 06/14/2024   Lab Results  Component Value Date   INR 1.0 05/11/2024   INR 1.1 10/30/2021   INR 1.1 07/10/2020   Radiology: VAS US  LOWER EXTREMITY VENOUS (DVT) (7a-7p) Result Date: 06/14/2024  Lower Venous DVT Study Patient Name:  NAYDEN CZAJKA  Date of Exam:   06/14/2024 Medical Rec #: [id number]        Accession #:    [id number] Date of Birth: 11-Jul-1946         Patient Gender: M Patient Age:   68 years Exam Location:  [facility] Procedure:      VAS US  LOWER EXTREMITY VENOUS (DVT) Referring Phys: ELSIE BODY --------------------------------------------------------------------------------  Indications: RLE pain and swelling. Other Indications: RT TKA 06/04/24. Comparison Study: No previous exams Performing Technologist: Jody Hill RVT, RDMS  Examination Guidelines: A complete evaluation includes B-mode imaging, spectral Doppler, color Doppler, and power Doppler as needed  of all accessible portions of each vessel. Bilateral testing is considered an  integral part of a complete examination. Limited examinations for reoccurring indications may be performed as noted. The reflux portion of the exam is performed with the patient in reverse Trendelenburg.  +---------+---------------+---------+-----------+----------+--------------+ RIGHT    CompressibilityPhasicitySpontaneityPropertiesThrombus Aging +---------+---------------+---------+-----------+----------+--------------+ CFV      Full           Yes      Yes                                 +---------+---------------+---------+-----------+----------+--------------+ SFJ      Full                                                        +---------+---------------+---------+-----------+----------+--------------+ FV Prox  Full           Yes      Yes                                 +---------+---------------+---------+-----------+----------+--------------+ FV Mid   Full           Yes      Yes                                 +---------+---------------+---------+-----------+----------+--------------+ FV DistalFull           Yes      Yes                                 +---------+---------------+---------+-----------+----------+--------------+ PFV      Full                                                        +---------+---------------+---------+-----------+----------+--------------+ POP      Full           Yes      Yes                                 +---------+---------------+---------+-----------+----------+--------------+ PTV      Full                                                        +---------+---------------+---------+-----------+----------+--------------+ PERO     Full                                                        +---------+---------------+---------+-----------+----------+--------------+   +--------+---------------+---------+-----------+----------+--------------------+ LEFT     CompressibilityPhasicitySpontaneityPropertiesThrombus Aging       +--------+---------------+---------+-----------+----------+--------------------+ CFV  None           No       No                   Acute - distal                                                            portion              +--------+---------------+---------+-----------+----------+--------------------+ SFJ     Partial                                      Acute                +--------+---------------+---------+-----------+----------+--------------------+ FV Prox Partial        No       No                   Acute                +--------+---------------+---------+-----------+----------+--------------------+ FV Mid  None           No       No                   Acute                +--------+---------------+---------+-----------+----------+--------------------+ FV      None           No       No                   Acute - one of       Distal                                               paired               +--------+---------------+---------+-----------+----------+--------------------+ PFV     Partial        No       No                   Acute                +--------+---------------+---------+-----------+----------+--------------------+ POP     None           No       No                   Acute                +--------+---------------+---------+-----------+----------+--------------------+ PTV     Full                                                              +--------+---------------+---------+-----------+----------+--------------------+ PERO    Full                                                              +--------+---------------+---------+-----------+----------+--------------------+  Summary: BILATERAL: -No evidence of popliteal cyst, bilaterally. RIGHT: - There is no evidence of deep vein thrombosis in the lower extremity.  Subcutaneous edema extending  from knee to ankle.  LEFT: - Findings consistent with acute deep vein thrombosis involving the left common femoral vein, SF junction, left femoral vein, left proximal profunda vein, and left popliteal vein.   *See table(s) above for measurements and observations. Electronically signed by Lonni Gaskins MD on 06/14/2024 at 12:54:16 PM.    Final    CT Angio Chest PE W and/or Wo Contrast Result Date: 06/14/2024 CLINICAL DATA:  Chest pain. Concern for pulmonary embolism. Prior right lobectomy. Right lower quadrant abdominal pain. EXAM: CT ANGIOGRAPHY CHEST CT ABDOMEN AND PELVIS WITH CONTRAST TECHNIQUE: Multidetector CT imaging of the chest was performed using the standard protocol during bolus administration of intravenous contrast. Multiplanar CT image reconstructions and MIPs were obtained to evaluate the vascular anatomy. Multidetector CT imaging of the abdomen and pelvis was performed using the standard protocol during bolus administration of intravenous contrast. RADIATION DOSE REDUCTION: This exam was performed according to the departmental dose-optimization program which includes automated exposure control, adjustment of the mA and/or kV according to patient size and/or use of iterative reconstruction technique. CONTRAST:  75mL OMNIPAQUE  IOHEXOL  350 MG/ML SOLN COMPARISON:  Chest CT dated 11/11/2023. FINDINGS: CTA CHEST FINDINGS Cardiovascular: Mild cardiomegaly. No pericardial effusion. There is moderate calcified and noncalcified plaque of the thoracic aorta. No aneurysmal dilatation. No pulmonary artery embolus identified. Mediastinum/Nodes: No hilar or mediastinal adenopathy. The esophagus is grossly unremarkable. No mediastinal fluid collection. Lungs/Pleura: Postsurgical changes of the right middle and right upper lobectomy. Stable pleural thickening of the right lung base and apex as seen previously. There is background of emphysema. No new consolidation. No pleural effusion or pneumothorax. The  central airways remain patent. Musculoskeletal: Bilateral total shoulder arthroplasties. Degenerative changes of the spine. No acute osseous pathology. Review of the MIP images confirms the above findings. CT ABDOMEN and PELVIS FINDINGS No intra-abdominal free air or free fluid. Hepatobiliary: The liver is unremarkable. No acute or dilatation. The gallbladder is unremarkable. Pancreas: Unremarkable. No pancreatic ductal dilatation or surrounding inflammatory changes. Spleen: Normal in size without focal abnormality. Adrenals/Urinary Tract: The adrenal glands are unremarkable. There is no hydronephrosis on either side. There is symmetric enhancement and excretion of contrast by both kidneys. The visualized ureters and urinary bladder appear unremarkable. Air within the urinary bladder likely introduced via recent catheterization. Stomach/Bowel: There is scattered colonic diverticula. There is no bowel obstruction or active inflammation. The appendix is not identified and may be surgically absent. Correlation with history of appendectomy recommended. Vascular/Lymphatic: Advanced aortoiliac atherosclerotic disease. The IVC is unremarkable. No portal venous gas. There is no adenopathy. Reproductive: Enlarged prostate gland measuring 5.3 cm in transverse axial diameter. The seminal vesicles are symmetric. Other: None Musculoskeletal: Osteopenia with degenerative changes spine. Lumbar laminectomy and posterior fusion. No acute osseous pathology. Review of the MIP images confirms the above findings. IMPRESSION: 1. No acute intrathoracic, abdominal, or pelvic pathology. No CT evidence of pulmonary artery embolus. 2. Postsurgical changes of the right lung. 3. Colonic diverticulosis. No bowel obstruction. 4.  Aortic Atherosclerosis (ICD10-I70.0). Electronically Signed   By: Vanetta Chou M.D.   On: 06/14/2024 11:06   CT ABDOMEN PELVIS W CONTRAST Result Date: 06/14/2024 CLINICAL DATA:  Chest pain. Concern for pulmonary  embolism. Prior right lobectomy. Right lower quadrant abdominal pain. EXAM: CT ANGIOGRAPHY CHEST CT ABDOMEN AND PELVIS WITH CONTRAST TECHNIQUE: Multidetector CT imaging of  the chest was performed using the standard protocol during bolus administration of intravenous contrast. Multiplanar CT image reconstructions and MIPs were obtained to evaluate the vascular anatomy. Multidetector CT imaging of the abdomen and pelvis was performed using the standard protocol during bolus administration of intravenous contrast. RADIATION DOSE REDUCTION: This exam was performed according to the departmental dose-optimization program which includes automated exposure control, adjustment of the mA and/or kV according to patient size and/or use of iterative reconstruction technique. CONTRAST:  75mL OMNIPAQUE  IOHEXOL  350 MG/ML SOLN COMPARISON:  Chest CT dated 11/11/2023. FINDINGS: CTA CHEST FINDINGS Cardiovascular: Mild cardiomegaly. No pericardial effusion. There is moderate calcified and noncalcified plaque of the thoracic aorta. No aneurysmal dilatation. No pulmonary artery embolus identified. Mediastinum/Nodes: No hilar or mediastinal adenopathy. The esophagus is grossly unremarkable. No mediastinal fluid collection. Lungs/Pleura: Postsurgical changes of the right middle and right upper lobectomy. Stable pleural thickening of the right lung base and apex as seen previously. There is background of emphysema. No new consolidation. No pleural effusion or pneumothorax. The central airways remain patent. Musculoskeletal: Bilateral total shoulder arthroplasties. Degenerative changes of the spine. No acute osseous pathology. Review of the MIP images confirms the above findings. CT ABDOMEN and PELVIS FINDINGS No intra-abdominal free air or free fluid. Hepatobiliary: The liver is unremarkable. No acute or dilatation. The gallbladder is unremarkable. Pancreas: Unremarkable. No pancreatic ductal dilatation or surrounding inflammatory changes.  Spleen: Normal in size without focal abnormality. Adrenals/Urinary Tract: The adrenal glands are unremarkable. There is no hydronephrosis on either side. There is symmetric enhancement and excretion of contrast by both kidneys. The visualized ureters and urinary bladder appear unremarkable. Air within the urinary bladder likely introduced via recent catheterization. Stomach/Bowel: There is scattered colonic diverticula. There is no bowel obstruction or active inflammation. The appendix is not identified and may be surgically absent. Correlation with history of appendectomy recommended. Vascular/Lymphatic: Advanced aortoiliac atherosclerotic disease. The IVC is unremarkable. No portal venous gas. There is no adenopathy. Reproductive: Enlarged prostate gland measuring 5.3 cm in transverse axial diameter. The seminal vesicles are symmetric. Other: None Musculoskeletal: Osteopenia with degenerative changes spine. Lumbar laminectomy and posterior fusion. No acute osseous pathology. Review of the MIP images confirms the above findings. IMPRESSION: 1. No acute intrathoracic, abdominal, or pelvic pathology. No CT evidence of pulmonary artery embolus. 2. Postsurgical changes of the right lung. 3. Colonic diverticulosis. No bowel obstruction. 4.  Aortic Atherosclerosis (ICD10-I70.0). Electronically Signed   By: Vanetta Chou M.D.   On: 06/14/2024 11:06    Assessment and Plan: 7M h/o OA, DDD (cervical and lumbar spine), COPD, HTN, GERD, and recent R TKR on 10/3 p/w acute LLE DVT and new onset AFRVR.  Acute LLE DVT BLE US  showed findings consistent with acute deep vein thrombosis involving the left common femoral vein, SF junction, left femoral vein, left proximal profunda vein, and left popliteal vein. -Continue IV heparin for now; will need DOAC on d/c  AFRVR CHADS VASC 3 (age and HTN) -Cards consulted; apprec eval/recs -Continue IV Cardizem for now -Continue hep gtt for now as well  Abd pain CTA abd  w/o acute findings; high suspicion for muscle straining in light of recent procedure c/b pain from recent R knee procedure -IV morphine 4mg  x1 -PO oxycodone  5-10mg  q4h prn  HTN -PTA amlodipine  5mg  daily  Recent R TKR -Defer orthopedic consult for now; will need close OP f/u   Advance Care Planning:   Code Status: Full Code   Consults: Cards  Family Communication: Spouse  Severity of Illness: The appropriate patient status for this patient is INPATIENT. Inpatient status is judged to be reasonable and necessary in order to provide the required intensity of service to ensure the patient's safety. The patient's presenting symptoms, physical exam findings, and initial radiographic and laboratory data in the context of their chronic comorbidities is felt to place them at high risk for further clinical deterioration. Furthermore, it is not anticipated that the patient will be medically stable for discharge from the hospital within 2 midnights of admission.   * I certify that at the point of admission it is my clinical judgment that the patient will require inpatient hospital care spanning beyond 2 midnights from the point of admission due to high intensity of service, high risk for further deterioration and high frequency of surveillance required.*   ------- I spent 55 minutes reviewing previous notes, at the bedside counseling/discussing the treatment plan, and performing clinical documentation.  Author: Marsha Ada, MD 06/14/2024 2:06 PM  For on call review [email or web address].

## 2024-06-14 NOTE — Progress Notes (Signed)
 BLE venous duplex has been completed.  Preliminary findings given to Dr. Francesca.   Results can be found under chart review under CV PROC. 06/14/2024 12:19 PM Evalise Abruzzese RVT, RDMS

## 2024-06-14 NOTE — Progress Notes (Signed)
 PHARMACY - ANTICOAGULATION CONSULT NOTE  Pharmacy Consult for heparin  Indication: DVT  Allergies  Allergen Reactions   Aspirin      Stomach bleeding    Nsaids     Do not give to patient due to hx of stomach bleeds    Losartan  Other (See Comments)    weakness   Meloxicam      Caused Stomach Bleeds     Patient Measurements: Height: 5' 7 (170.2 cm) Weight: 67.1 kg (148 lb) IBW/kg (Calculated) : 66.1 HEPARIN DW (KG): 67.1  Vital Signs: Temp: 98.5 F (36.9 C) (10/13 0921) Temp Source: Oral (10/13 0921) BP: 173/117 (10/13 0921) Pulse Rate: 117 (10/13 0921)  Labs: Recent Labs    06/14/24 0912 06/14/24 1003  HGB 12.1* 12.6*  HCT 37.5* 37.0*  PLT 382  --   CREATININE 0.71 0.70  TROPONINIHS 16  --     Estimated Creatinine Clearance: 72.3 mL/min (by C-G formula based on SCr of 0.7 mg/dL).   Medical History: Past Medical History:  Diagnosis Date   Allergic rhinitis    Atypical chest pain 12/01/2014   Back pain 01/12/2013   COPD (chronic obstructive pulmonary disease) (HCC)    FeV1 64%-2007   Dyspnea    Dysrhythmia    PVCs   Emphysema    GERD (gastroesophageal reflux disease)    H/O total shoulder replacement, left 2024   H/O total shoulder replacement, right 2023   History of lung abscess    bronchiectasis with RMLandRLL ersection -1996- Dr Brantley   Hordeolum externum (stye) 04/23/2015   Right eye   Hypertension    Impaired vision    glasses   Osteoarthritis    hands and knees   Pneumonia    Sinusitis, acute 04/23/2015   Skin cancer    Right eye area    Assessment: Patient presenting with CC of abdominal pain, recent knee replacement 10/3 noted that right leg was swollen. Vascular US  showing acute L DVT. Patient on Eliquis 2.5mg  BID post-op for DVT ppx last dose 10/12 PM per patient. HgB 12.6 and PLTs 382.   Pharmacy consulted to dose heparin gtt.   Goal of Therapy:  Heparin level 0.3-0.7 units/ml aPTT 66-102 Monitor platelets by anticoagulation  protocol: Yes   Plan:  Give 2000 units bolus x 1, reduced with recent DOAC intake.  Start heparin infusion at 1100 units/hr Check anti-Xa level in 8 hours and daily while on heparin, will get aPTT in addition to heparin level to assess for correlation.  Continue to monitor H&H and platelets  Powell Blush, PharmD, BCCCP  06/14/2024,12:20 PM

## 2024-06-14 NOTE — ED Triage Notes (Signed)
 Patient presents to  ED via [facility] states he as been having abd. Pain foe several days states it is getting worse , hx. Of hernia repair several years ago. Denies n/v . Had knee replacement 10/3, right leg is swollen. Positive right pedal pulse. Good cap refill

## 2024-06-14 NOTE — ED Provider Notes (Signed)
 [place] EMERGENCY DEPARTMENT AT [facility] Provider Note  CSN: [id number] Arrival date & time: 06/14/24 9092  Chief Complaint(s) Abdominal Pain  HPI [name] is a 78 y.o. male history of COPD, hypertension, recent right knee replacement presenting with right groin pain.  Patient reports pain in the right groin, worse with moving the leg.  Has also had swelling to the leg.  Has been ambulatory with a walker.  He is concerned that he may have a hernia.  Reports prior right inguinal hernia repair.  Denies any nausea, vomiting, reports intermittent constipation and diarrhea.  No fevers or chills.  Reports chronic unchanged shortness of breath.  Has not noticed any palpitations.  Denies chest pain.   Past Medical History Past Medical History:  Diagnosis Date   Allergic rhinitis    Atypical chest pain 12/01/2014   Back pain 01/12/2013   COPD (chronic obstructive pulmonary disease) (HCC)    FeV1 64%-2007   Dyspnea    Dysrhythmia    PVCs   Emphysema    GERD (gastroesophageal reflux disease)    H/O total shoulder replacement, left 2024   H/O total shoulder replacement, right 2023   History of lung abscess    bronchiectasis with RMLandRLL ersection -1996- Dr Brantley   Hordeolum externum (stye) 04/23/2015   Right eye   Hypertension    Impaired vision    glasses   Osteoarthritis    hands and knees   Pneumonia    Sinusitis, acute 04/23/2015   Skin cancer    Right eye area   Patient Active Problem List   Diagnosis Date Noted   DVT (deep venous thrombosis) (HCC) 06/14/2024   Status post total right knee replacement 06/04/2024   Pre-operative clearance 05/11/2024   Unilateral primary osteoarthritis, right knee 04/14/2024   Left rotator cuff tear arthropathy 08/05/2023   Mitral valve disease 01/21/2023   Pedal edema 01/21/2023   Rotator cuff arthropathy of right shoulder 08/22/2022   Gastric ulcer 03/14/2022   Bradycardia 03/14/2022   Chronic pain syndrome  11/06/2021   Hypokalemia 11/03/2021   Protein-calorie malnutrition, severe 11/02/2021   Acute duodenal ulcer with hemorrhage    Acute upper GI bleed 10/30/2021   Anemia due to acute blood loss 10/30/2021   DDD (degenerative disc disease), lumbar 10/30/2021   Osteoarthritis 10/30/2021   Sedimentation rate elevation 10/16/2021   CRP elevated 10/16/2021   Hemoptysis 06/26/2020   Hyperlipidemia 12/23/2019   Recurrent sinusitis 09/28/2018   High risk medication use 09/28/2018   Solitary pulmonary nodule on lung CT 07/06/2018   Unilateral primary osteoarthritis, left knee 12/18/2017   Anemia 09/15/2017   Left knee pain 09/15/2017   Chronic arthralgias of knees and hips 09/15/2017   Bilateral shoulder pain 09/15/2017   Nocturia 09/15/2017   Hyperglycemia 09/15/2017   Skin lesion 09/15/2017   H/O tobacco use, presenting hazards to health 09/15/2017   Hx of migraines 09/28/2016   COPD exacerbation (HCC) 04/24/2015   Acute sinusitis 04/23/2015   Atypical chest pain 12/01/2014   Screening for ischemic heart disease 07/25/2014   Prostate cancer screening 07/25/2014   Preventative health care 07/25/2014   Asymptomatic PVCs 07/25/2014   Arthritis 10/18/2013   Incomplete RBBB 07/19/2013   Back pain 01/12/2013   Annual physical exam 07/21/2011   Carotid bruit 07/21/2011   Benign essential HTN 05/23/2010   Allergic rhinitis 07/07/2007   COPD GOLD II D 07/07/2007   GERD 07/07/2007   Home Medication(s) Prior to Admission medications  Medication Sig Start Date End Date Taking? Authorizing Provider  acetaminophen  (TYLENOL ) 500 MG tablet Take 1,000 mg by mouth in the morning and at bedtime.    [provider]  albuterol  (VENTOLIN  HFA) 108 (90 Base) MCG/ACT inhaler INHALE 2 PUFFS INTO THE LUNGS EVERY 6 HOURS AS NEEDED FOR WHEEZING OR SHORTNESS OF BREATH. 08/02/23   Domenica Harlene LABOR, MD  amLODipine  (NORVASC ) 5 MG tablet Take 1 tablet (5 mg total) by mouth daily. 05/06/24   Domenica Harlene LABOR, MD  apixaban (ELIQUIS) 2.5 MG TABS tablet Take 1 tablet (2.5 mg total) by mouth every 12 (twelve) hours. 06/05/24   Vernetta Lonni GRADE, MD  benzonatate  (TESSALON ) 100 MG capsule TAKE 1 CAPSULE BY MOUTH TWICE DAILY AS NEEDED FOR COUGH 09/05/23   Webb, Padonda B, FNP  CALCIUM CARBONATE ANTACID PO Take 1,000 mg by mouth in the morning.    [provider]  cetirizine (ZYRTEC) 10 MG tablet Take 10 mg by mouth in the morning.    [provider]  fluticasone  (FLONASE ) 50 MCG/ACT nasal spray USE 2 SPRAYS IN EACH NOSTRIL EVERY DAY 12/05/23   Domenica Harlene LABOR, MD  GLUCOSAMINE-CHONDROITIN PO Take 1 tablet by mouth 2 (two) times daily.    [provider]  guaiFENesin  (MUCINEX ) 600 MG 12 hr tablet Take 600 mg by mouth 2 (two) times daily.    [provider]  ipratropium (ATROVENT ) 0.06 % nasal spray Place 1 spray into both nostrils 4 (four) times daily. 12/16/23   Desai, Nikita S, MD  loratadine (CLARITIN) 10 MG tablet Take 10 mg by mouth every evening.    [provider]  methocarbamol (ROBAXIN) 500 MG tablet Take 1 tablet (500 mg total) by mouth every 6 (six) hours as needed for muscle spasms. 06/05/24   Vernetta Lonni GRADE, MD  montelukast  (SINGULAIR ) 10 MG tablet TAKE 1 TABLET EVERY DAY 11/26/23   Domenica Harlene LABOR, MD  Multiple Vitamin (MULTIVITAMIN WITH MINERALS) TABS tablet Take 1 tablet by mouth in the morning.    [provider]  oxyCODONE  (OXY IR/ROXICODONE ) 5 MG immediate release tablet Take 1-2 tablets (5-10 mg total) by mouth every 6 (six) hours as needed for moderate pain (pain score 4-6) (pain score 4-6). No more than 6 tablets daily. 06/05/24   Vernetta Lonni GRADE, MD  pantoprazole  (PROTONIX ) 20 MG tablet Take 1 tablet by mouth once daily Patient taking differently: Take 20 mg by mouth every evening. 01/12/24   Domenica Harlene LABOR, MD  TRELEGY ELLIPTA  100-62.5-25 MCG/ACT AEPB INHALE 1 PUFF INTO THE LUNGS DAILY 03/02/24   Domenica Harlene LABOR, MD                                                                                                                                     Past Surgical History Past Surgical History:  Procedure Laterality Date   COLONOSCOPY  11/20/2010   Dr.Stark  ESOPHAGOGASTRODUODENOSCOPY (EGD) WITH PROPOFOL  N/A 10/31/2021   Procedure: ESOPHAGOGASTRODUODENOSCOPY (EGD) WITH PROPOFOL ;  Surgeon: Avram Lupita BRAVO, MD;  Location: [facility] ENDOSCOPY;  Service: Gastroenterology;  Laterality: N/A;   HEMOSTASIS CONTROL  10/31/2021   Procedure: HEMOSTASIS CONTROL;  Surgeon: Avram Lupita BRAVO, MD;  Location: [facility] ENDOSCOPY;  Service: Gastroenterology;;   HERNIA REPAIR  10/03/2009   HOT HEMOSTASIS N/A 10/31/2021   Procedure: HOT HEMOSTASIS (ARGON PLASMA COAGULATION/BICAP);  Surgeon: Avram Lupita BRAVO, MD;  Location: [facility] ENDOSCOPY;  Service: Gastroenterology;  Laterality: N/A;   left knee     lower back surgery  09/02/2008   06/2009   LUNG SURGERY     RML andRUL removed due to bleeding and bronchiectasis and lung abcess   MOUTH SURGERY     teeth extraction   NECK SURGERY  05/03/2008   REVERSE SHOULDER ARTHROPLASTY Right 08/22/2022   Procedure: RIGHT REVERSE SHOULDER ARTHROPLASTY;  Surgeon: Genelle Standing, MD;  Location: [facility] OR;  Service: Orthopedics;  Laterality: Right;   REVERSE SHOULDER ARTHROPLASTY Left 08/05/2023   Procedure: LEFT REVERSE SHOULDER ARTHROPLASTY;  Surgeon: Genelle Standing, MD;  Location: [facility];  Service: Orthopedics;  Laterality: Left;   right foot surgery     SCLEROTHERAPY  10/31/2021   Procedure: SCLEROTHERAPY;  Surgeon: Avram Lupita BRAVO, MD;  Location: [facility] ENDOSCOPY;  Service: Gastroenterology;;   TONSILLECTOMY     removed as a child   TOTAL KNEE ARTHROPLASTY Right 06/04/2024   Procedure: ARTHROPLASTY, KNEE, TOTAL;  Surgeon: Vernetta Lonni GRADE, MD;  Location: [facility] ORS;  Service: Orthopedics;  Laterality: Right;   VIDEO BRONCHOSCOPY N/A 07/12/2020   Procedure: VIDEO BRONCHOSCOPY;  Surgeon:  Kerrin Standing BROCKS, MD;  Location[facility] OR;  Service: Thoracic;  Laterality: N/A;   VIDEO BRONCHOSCOPY WITH ENDOBRONCHIAL NAVIGATION N/A 07/12/2020   Procedure: VIDEO BRONCHOSCOPY WITH ENDOBRONCHIAL NAVIGATION;  Surgeon: Kerrin Standing BROCKS, MD;  Location: [facility] OR;  Service: Thoracic;  Laterality: N/A;   Family History Family History  Problem Relation Age of Onset   Heart failure Mother        age 9   COPD Mother    Prostate cancer Father        father died prostate ca   Obstructive Sleep Apnea Brother    Obesity Brother    Colon cancer Paternal Grandmother    Healthy Son    Coronary artery disease Other        1st degree relative<60   Stroke Other        1st degree relative<50   Esophageal cancer Neg Hx    Stomach cancer Neg Hx     Social History Social History   Tobacco Use   Smoking status: Former    Current packs/day: 0.00    Average packs/day: 1.5 packs/day for 38.0 years (57.0 ttl pk-yrs)    Types: Cigarettes    Start date: 09/03/1962    Quit date: 09/03/2000    Years since quitting: 23.7    Passive exposure: Past   Smokeless tobacco: Never  Vaping Use   Vaping status: Never Used  Substance Use Topics   Alcohol use: Yes    Alcohol/week: 1.0 standard drink of alcohol    Types: 1 Cans of beer per week    Comment: 3 or 4 nights weekly   Drug use: No   Allergies Aspirin , Nsaids, Losartan , and Meloxicam   Review of Systems Review of Systems  All other systems reviewed and are negative.   Physical Exam Vital Signs  I have reviewed the  triage vital signs BP 134/69   Pulse 91   Temp 98.3 F (36.8 C) (Oral)   Resp (!) 27   Ht 5' 7 (1.702 m)   Wt 67.1 kg   SpO2 99%   BMI 23.18 kg/m  Physical Exam Vitals and nursing note reviewed.  Constitutional:      General: He is not in acute distress.    Appearance: Normal appearance.  HENT:     Mouth/Throat:     Mouth: Mucous membranes are moist.  Eyes:     Conjunctiva/sclera: Conjunctivae normal.   Cardiovascular:     Rate and Rhythm: Tachycardia present. Rhythm irregular.     Pulses:          Dorsalis pedis pulses are 2+ on the right side and 2+ on the left side.  Pulmonary:     Effort: Pulmonary effort is normal. No respiratory distress.     Breath sounds: Normal breath sounds.  Abdominal:     General: Abdomen is flat.     Palpations: Abdomen is soft.     Tenderness: There is no abdominal tenderness.  Genitourinary:    Comments: No palpable hernia but some tenderness around this region Musculoskeletal:        General: Tenderness (RLE) present.     Right lower leg: Edema (2+) present.     Left lower leg: No edema.     Comments: Surgical dressing in place over R knee appears C/D/I   Skin:    General: Skin is warm and dry.     Capillary Refill: Capillary refill takes less than 2 seconds.  Neurological:     Mental Status: He is alert and oriented to person, place, and time. Mental status is at baseline.  Psychiatric:        Mood and Affect: Mood normal.        Behavior: Behavior normal.     ED Results and Treatments Labs (all labs ordered are listed, but only abnormal results are displayed) Labs Reviewed  COMPREHENSIVE METABOLIC PANEL WITH GFR - Abnormal; Notable for the following components:      Result Value   CO2 21 (*)    Calcium 8.5 (*)    Total Protein 5.9 (*)    Albumin 2.6 (*)    Total Bilirubin 1.3 (*)    All other components within normal limits  CBC WITH DIFFERENTIAL/PLATELET - Abnormal; Notable for the following components:   RBC 3.78 (*)    Hemoglobin 12.1 (*)    HCT 37.5 (*)    All other components within normal limits  BRAIN NATRIURETIC PEPTIDE - Abnormal; Notable for the following components:   B Natriuretic Peptide 196.2 (*)    All other components within normal limits  I-STAT CHEM 8, ED - Abnormal; Notable for the following components:   Glucose, Bld 100 (*)    Calcium, Ion 1.09 (*)    Hemoglobin 12.6 (*)    HCT 37.0 (*)    All other  components within normal limits  TROPONIN I (HIGH SENSITIVITY) - Abnormal; Notable for the following components:   Troponin I (High Sensitivity) 18 (*)    All other components within normal limits  MAGNESIUM  LIPASE, BLOOD  URINALYSIS, ROUTINE W REFLEX MICROSCOPIC  HEPARIN LEVEL (UNFRACTIONATED)  APTT  TROPONIN I (HIGH SENSITIVITY)  Radiology VAS US  LOWER EXTREMITY VENOUS (DVT) (7a-7p) Result Date: 06/14/2024  Lower Venous DVT Study Patient Name:  TEVAN MARIAN  Date of Exam:   06/14/2024 Medical Rec #: [id number]        Accession #:    [id number] Date of Birth: 05-06-1946         Patient Gender: M Patient Age:   70 years Exam [facility]:  [facility] Procedure:      VAS US  LOWER EXTREMITY VENOUS (DVT) Referring Phys: ELSIE BODY --------------------------------------------------------------------------------  Indications: RLE pain and swelling. Other Indications: RT TKA 06/04/24. Comparison Study: No previous exams Performing Technologist: Jody Hill RVT, RDMS  Examination Guidelines: A complete evaluation includes B-mode imaging, spectral Doppler, color Doppler, and power Doppler as needed of all accessible portions of each vessel. Bilateral testing is considered an integral part of a complete examination. Limited examinations for reoccurring indications may be performed as noted. The reflux portion of the exam is performed with the patient in reverse Trendelenburg.  +---------+---------------+---------+-----------+----------+--------------+ RIGHT    CompressibilityPhasicitySpontaneityPropertiesThrombus Aging +---------+---------------+---------+-----------+----------+--------------+ CFV      Full           Yes      Yes                                 +---------+---------------+---------+-----------+----------+--------------+ SFJ      Full                                                         +---------+---------------+---------+-----------+----------+--------------+ FV Prox  Full           Yes      Yes                                 +---------+---------------+---------+-----------+----------+--------------+ FV Mid   Full           Yes      Yes                                 +---------+---------------+---------+-----------+----------+--------------+ FV DistalFull           Yes      Yes                                 +---------+---------------+---------+-----------+----------+--------------+ PFV      Full                                                        +---------+---------------+---------+-----------+----------+--------------+ POP      Full           Yes      Yes                                 +---------+---------------+---------+-----------+----------+--------------+ PTV      Full                                                        +---------+---------------+---------+-----------+----------+--------------+  PERO     Full                                                        +---------+---------------+---------+-----------+----------+--------------+   +--------+---------------+---------+-----------+----------+--------------------+ LEFT    CompressibilityPhasicitySpontaneityPropertiesThrombus Aging       +--------+---------------+---------+-----------+----------+--------------------+ CFV     None           No       No                   Acute - distal                                                            portion              +--------+---------------+---------+-----------+----------+--------------------+ SFJ     Partial                                      Acute                +--------+---------------+---------+-----------+----------+--------------------+ FV Prox Partial        No       No                   Acute                 +--------+---------------+---------+-----------+----------+--------------------+ FV Mid  None           No       No                   Acute                +--------+---------------+---------+-----------+----------+--------------------+ FV      None           No       No                   Acute - one of       Distal                                               paired               +--------+---------------+---------+-----------+----------+--------------------+ PFV     Partial        No       No                   Acute                +--------+---------------+---------+-----------+----------+--------------------+ POP     None           No       No                   Acute                +--------+---------------+---------+-----------+----------+--------------------+ PTV  Full                                                              +--------+---------------+---------+-----------+----------+--------------------+ PERO    Full                                                              +--------+---------------+---------+-----------+----------+--------------------+     Summary: BILATERAL: -No evidence of popliteal cyst, bilaterally. RIGHT: - There is no evidence of deep vein thrombosis in the lower extremity.  Subcutaneous edema extending from knee to ankle.  LEFT: - Findings consistent with acute deep vein thrombosis involving the left common femoral vein, SF junction, left femoral vein, left proximal profunda vein, and left popliteal vein.   *See table(s) above for measurements and observations. Electronically signed by Lonni Gaskins MD on 06/14/2024 at 12:54:16 PM.    Final    CT Angio Chest PE W and/or Wo Contrast Result Date: 06/14/2024 CLINICAL DATA:  Chest pain. Concern for pulmonary embolism. Prior right lobectomy. Right lower quadrant abdominal pain. EXAM: CT ANGIOGRAPHY CHEST CT ABDOMEN AND PELVIS WITH CONTRAST TECHNIQUE: Multidetector CT imaging  of the chest was performed using the standard protocol during bolus administration of intravenous contrast. Multiplanar CT image reconstructions and MIPs were obtained to evaluate the vascular anatomy. Multidetector CT imaging of the abdomen and pelvis was performed using the standard protocol during bolus administration of intravenous contrast. RADIATION DOSE REDUCTION: This exam was performed according to the departmental dose-optimization program which includes automated exposure control, adjustment of the mA and/or kV according to patient size and/or use of iterative reconstruction technique. CONTRAST:  75mL OMNIPAQUE  IOHEXOL  350 MG/ML SOLN COMPARISON:  Chest CT dated 11/11/2023. FINDINGS: CTA CHEST FINDINGS Cardiovascular: Mild cardiomegaly. No pericardial effusion. There is moderate calcified and noncalcified plaque of the thoracic aorta. No aneurysmal dilatation. No pulmonary artery embolus identified. Mediastinum/Nodes: No hilar or mediastinal adenopathy. The esophagus is grossly unremarkable. No mediastinal fluid collection. Lungs/Pleura: Postsurgical changes of the right middle and right upper lobectomy. Stable pleural thickening of the right lung base and apex as seen previously. There is background of emphysema. No new consolidation. No pleural effusion or pneumothorax. The central airways remain patent. Musculoskeletal: Bilateral total shoulder arthroplasties. Degenerative changes of the spine. No acute osseous pathology. Review of the MIP images confirms the above findings. CT ABDOMEN and PELVIS FINDINGS No intra-abdominal free air or free fluid. Hepatobiliary: The liver is unremarkable. No acute or dilatation. The gallbladder is unremarkable. Pancreas: Unremarkable. No pancreatic ductal dilatation or surrounding inflammatory changes. Spleen: Normal in size without focal abnormality. Adrenals/Urinary Tract: The adrenal glands are unremarkable. There is no hydronephrosis on either side. There is  symmetric enhancement and excretion of contrast by both kidneys. The visualized ureters and urinary bladder appear unremarkable. Air within the urinary bladder likely introduced via recent catheterization. Stomach/Bowel: There is scattered colonic diverticula. There is no bowel obstruction or active inflammation. The appendix is not identified and may be surgically absent. Correlation with history of appendectomy recommended. Vascular/Lymphatic: Advanced aortoiliac atherosclerotic disease. The IVC is unremarkable. No portal venous gas. There is no  adenopathy. Reproductive: Enlarged prostate gland measuring 5.3 cm in transverse axial diameter. The seminal vesicles are symmetric. Other: None Musculoskeletal: Osteopenia with degenerative changes spine. Lumbar laminectomy and posterior fusion. No acute osseous pathology. Review of the MIP images confirms the above findings. IMPRESSION: 1. No acute intrathoracic, abdominal, or pelvic pathology. No CT evidence of pulmonary artery embolus. 2. Postsurgical changes of the right lung. 3. Colonic diverticulosis. No bowel obstruction. 4.  Aortic Atherosclerosis (ICD10-I70.0). Electronically Signed   By: Vanetta Chou M.D.   On: 06/14/2024 11:06   CT ABDOMEN PELVIS W CONTRAST Result Date: 06/14/2024 CLINICAL DATA:  Chest pain. Concern for pulmonary embolism. Prior right lobectomy. Right lower quadrant abdominal pain. EXAM: CT ANGIOGRAPHY CHEST CT ABDOMEN AND PELVIS WITH CONTRAST TECHNIQUE: Multidetector CT imaging of the chest was performed using the standard protocol during bolus administration of intravenous contrast. Multiplanar CT image reconstructions and MIPs were obtained to evaluate the vascular anatomy. Multidetector CT imaging of the abdomen and pelvis was performed using the standard protocol during bolus administration of intravenous contrast. RADIATION DOSE REDUCTION: This exam was performed according to the departmental dose-optimization program which  includes automated exposure control, adjustment of the mA and/or kV according to patient size and/or use of iterative reconstruction technique. CONTRAST:  75mL OMNIPAQUE  IOHEXOL  350 MG/ML SOLN COMPARISON:  Chest CT dated 11/11/2023. FINDINGS: CTA CHEST FINDINGS Cardiovascular: Mild cardiomegaly. No pericardial effusion. There is moderate calcified and noncalcified plaque of the thoracic aorta. No aneurysmal dilatation. No pulmonary artery embolus identified. Mediastinum/Nodes: No hilar or mediastinal adenopathy. The esophagus is grossly unremarkable. No mediastinal fluid collection. Lungs/Pleura: Postsurgical changes of the right middle and right upper lobectomy. Stable pleural thickening of the right lung base and apex as seen previously. There is background of emphysema. No new consolidation. No pleural effusion or pneumothorax. The central airways remain patent. Musculoskeletal: Bilateral total shoulder arthroplasties. Degenerative changes of the spine. No acute osseous pathology. Review of the MIP images confirms the above findings. CT ABDOMEN and PELVIS FINDINGS No intra-abdominal free air or free fluid. Hepatobiliary: The liver is unremarkable. No acute or dilatation. The gallbladder is unremarkable. Pancreas: Unremarkable. No pancreatic ductal dilatation or surrounding inflammatory changes. Spleen: Normal in size without focal abnormality. Adrenals/Urinary Tract: The adrenal glands are unremarkable. There is no hydronephrosis on either side. There is symmetric enhancement and excretion of contrast by both kidneys. The visualized ureters and urinary bladder appear unremarkable. Air within the urinary bladder likely introduced via recent catheterization. Stomach/Bowel: There is scattered colonic diverticula. There is no bowel obstruction or active inflammation. The appendix is not identified and may be surgically absent. Correlation with history of appendectomy recommended. Vascular/Lymphatic: Advanced  aortoiliac atherosclerotic disease. The IVC is unremarkable. No portal venous gas. There is no adenopathy. Reproductive: Enlarged prostate gland measuring 5.3 cm in transverse axial diameter. The seminal vesicles are symmetric. Other: None Musculoskeletal: Osteopenia with degenerative changes spine. Lumbar laminectomy and posterior fusion. No acute osseous pathology. Review of the MIP images confirms the above findings. IMPRESSION: 1. No acute intrathoracic, abdominal, or pelvic pathology. No CT evidence of pulmonary artery embolus. 2. Postsurgical changes of the right lung. 3. Colonic diverticulosis. No bowel obstruction. 4.  Aortic Atherosclerosis (ICD10-I70.0). Electronically Signed   By: Vanetta Chou M.D.   On: 06/14/2024 11:06    Pertinent labs & imaging results that were available during my care of the patient were reviewed by me and considered in my medical decision making (see MDM for details).  Medications Ordered in ED Medications  diltiazem (CARDIZEM) 1 mg/mL load via infusion 15 mg (15 mg Intravenous Bolus from Bag 06/14/24 1249)    And  diltiazem (CARDIZEM) 125 mg in dextrose  5% 125 mL (1 mg/mL) infusion (5 mg/hr Intravenous New Bag/Given 06/14/24 1249)  heparin ADULT infusion 100 units/mL (25000 units/250mL) (1,100 Units/hr Intravenous New Bag/Given 06/14/24 1253)  amLODipine  (NORVASC ) tablet 5 mg (5 mg Oral Given 06/14/24 1430)  budesonide -glycopyrrolate-formoterol  (BREZTRI) 160-9-4.8 MCG/ACT inhaler 2 puff (has no administration in time range)  ipratropium-albuterol  (DUONEB) 0.5-2.5 (3) MG/3ML nebulizer solution 3 mL (has no administration in time range)  morphine (PF) 4 MG/ML injection 4 mg (has no administration in time range)  oxyCODONE  (Oxy IR/ROXICODONE ) immediate release tablet 5 mg (has no administration in time range)    Or  oxyCODONE  (Oxy IR/ROXICODONE ) immediate release tablet 10 mg (has no administration in time range)  iohexol  (OMNIPAQUE ) 350 MG/ML injection 75 mL  (75 mLs Intravenous Contrast Given 06/14/24 1033)  heparin bolus via infusion 2,000 Units (2,000 Units Intravenous Bolus from Bag 06/14/24 1253)                                                                                                                                     Procedures .Critical Care  Performed by: Francesca Elsie CROME, MD Authorized by: Francesca Elsie CROME, MD   Critical care provider statement:    Critical care time (minutes):  30   Critical care was necessary to treat or prevent imminent or life-threatening deterioration of the following conditions:  Cardiac failure   Critical care was time spent personally by me on the following activities:  Development of treatment plan with patient or surrogate, discussions with consultants, evaluation of patient's response to treatment, examination of patient, ordering and review of laboratory studies, ordering and review of radiographic studies, ordering and performing treatments and interventions, pulse oximetry, re-evaluation of patient's condition and review of old charts   (including critical care time)  Medical Decision Making / ED Course   MDM:  78 year old presenting to the emergency department with groin pain.  Patient overall well-appearing, vitals notable for tachycardia.  Appears to be in A-fib.  Denies any history of A-fib.  Physical exam notable for right lower extremity edema likely due to recent knee surgery.  Differential includes hernia, does report prior hernia, denies concerning symptoms for strangulation or incarceration such as nausea or vomiting, but will check CT scan.  Differentials includes DVT given recent surgery.  Patient has been on Eliquis prophylactic dosing after surgery, will check DVT study.  Vitals are also notable for tachycardia with A-fib with RVR.  Does not seem patient has any history of this.  Raise concern for possible underlying process such as pulmonary embolism.  Will obtain CT angio  chest.  Does report some shortness of breath.  Will reassess.  Clinical Course as of 06/14/24 1604  Mon Jun 14, 2024  1602 CTA chest without evidence of  PE.  CT abdomen negative.  Lower extremity ultrasound negative for DVT on the right but actually does show DVT on the left with common femoral DVT extending to popliteal.  Likely related to orthopedic surgery.  He is on Eliquis but only prophylactic dose.  Discussed with Dr. Gretta with vascular surgery recommends outpatient follow-up.  He is still in A-fib with RVR and will need to be admitted for this given cardioversion is not an option.  Placed on diltiazem drip with improvement.  Signed out to Dr. Georgina [WS]    Clinical Course User Index [WS] Francesca Elsie CROME, MD     Additional history obtained: -Additional history obtained from ems -External records from outside source obtained and reviewed including: Chart review including previous notes, labs, imaging, consultation notes including prior notes    Lab Tests: -I ordered, reviewed, and interpreted labs.   The pertinent results include:   Labs Reviewed  COMPREHENSIVE METABOLIC PANEL WITH GFR - Abnormal; Notable for the following components:      Result Value   CO2 21 (*)    Calcium 8.5 (*)    Total Protein 5.9 (*)    Albumin 2.6 (*)    Total Bilirubin 1.3 (*)    All other components within normal limits  CBC WITH DIFFERENTIAL/PLATELET - Abnormal; Notable for the following components:   RBC 3.78 (*)    Hemoglobin 12.1 (*)    HCT 37.5 (*)    All other components within normal limits  BRAIN NATRIURETIC PEPTIDE - Abnormal; Notable for the following components:   B Natriuretic Peptide 196.2 (*)    All other components within normal limits  I-STAT CHEM 8, ED - Abnormal; Notable for the following components:   Glucose, Bld 100 (*)    Calcium, Ion 1.09 (*)    Hemoglobin 12.6 (*)    HCT 37.0 (*)    All other components within normal limits  TROPONIN I (HIGH SENSITIVITY) -  Abnormal; Notable for the following components:   Troponin I (High Sensitivity) 18 (*)    All other components within normal limits  MAGNESIUM  LIPASE, BLOOD  URINALYSIS, ROUTINE W REFLEX MICROSCOPIC  HEPARIN LEVEL (UNFRACTIONATED)  APTT  TROPONIN I (HIGH SENSITIVITY)    Notable for mild BNP elevation  EKG   EKG Interpretation Date/Time:  Monday June 14 2024 09:58:33 EDT Ventricular Rate:  145 PR Interval:    QRS Duration:  101 QT Interval:  316 QTC Calculation: 488 R Axis:   11  Text Interpretation: Atrial fibrillation with rapid V-rate RSR' in V1 or V2, right VCD or RVH Confirmed by Francesca Elsie ([id number]) on 06/14/2024 10:38:00 AM         Imaging Studies ordered: I ordered imaging studies including US  BLE, CTA chest, CT abdomen On my interpretation imaging demonstrates DVT on left I independently visualized and interpreted imaging. I agree with the radiologist interpretation   Medicines ordered and prescription drug management: Meds ordered this encounter  Medications   iohexol  (OMNIPAQUE ) 350 MG/ML injection 75 mL   AND Linked Order Group    diltiazem (CARDIZEM) 1 mg/mL load via infusion 15 mg    diltiazem (CARDIZEM) 125 mg in dextrose  5% 125 mL (1 mg/mL) infusion   heparin bolus via infusion 2,000 Units   heparin ADULT infusion 100 units/mL (25000 units/250mL)   amLODipine  (NORVASC ) tablet 5 mg   budesonide -glycopyrrolate-formoterol  (BREZTRI) 160-9-4.8 MCG/ACT inhaler 2 puff   ipratropium-albuterol  (DUONEB) 0.5-2.5 (3) MG/3ML nebulizer solution 3 mL   DISCONTD: oxyCODONE  (Oxy  IR/ROXICODONE ) immediate release tablet 5 mg    Refill:  0   morphine (PF) 4 MG/ML injection 4 mg   OR Linked Order Group    oxyCODONE  (Oxy IR/ROXICODONE ) immediate release tablet 5 mg     Refill:  0    oxyCODONE  (Oxy IR/ROXICODONE ) immediate release tablet 10 mg     Refill:  0    -I have reviewed the patients home medicines and have made adjustments as needed   Consultations  Obtained: I requested consultation with the vascular surgeon,  and discussed lab and imaging findings as well as pertinent plan - they recommend: outpatient follow up    Cardiac Monitoring: The patient was maintained on a cardiac monitor.  I personally viewed and interpreted the cardiac monitored which showed an underlying rhythm of: NSR   Reevaluation: After the interventions noted above, I reevaluated the patient and found that their symptoms have improved  Co morbidities that complicate the patient evaluation  Past Medical History:  Diagnosis Date   Allergic rhinitis    Atypical chest pain 12/01/2014   Back pain 01/12/2013   COPD (chronic obstructive pulmonary disease) (HCC)    FeV1 64%-2007   Dyspnea    Dysrhythmia    PVCs   Emphysema    GERD (gastroesophageal reflux disease)    H/O total shoulder replacement, left 2024   H/O total shoulder replacement, right 2023   History of lung abscess    bronchiectasis with RMLandRLL ersection -1996- Dr Brantley   Hordeolum externum (stye) 04/23/2015   Right eye   Hypertension    Impaired vision    glasses   Osteoarthritis    hands and knees   Pneumonia    Sinusitis, acute 04/23/2015   Skin cancer    Right eye area      Dispostion: Disposition decision including need for hospitalization was considered, and patient admitted to the hospital.    Final Clinical Impression(s) / ED Diagnoses Final diagnoses:  Atrial fibrillation with RVR (HCC)  Acute deep vein thrombosis (DVT) of femoral vein of left lower extremity (HCC)     This chart was dictated using voice recognition software.  Despite best efforts to proofread,  errors can occur which can change the documentation meaning.    Francesca Elsie CROME, MD 06/14/24 [phone]

## 2024-06-14 NOTE — ED Notes (Signed)
 Patient transported to vascular ultrasound

## 2024-06-14 NOTE — Progress Notes (Signed)
 PHARMACY - ANTICOAGULATION CONSULT NOTE  Pharmacy Consult for heparin  Indication: DVT  Allergies  Allergen Reactions   Aspirin  Other (See Comments)    Stomach bleeding    Meloxicam  Other (See Comments)    Caused Stomach Bleeds    Nsaids Other (See Comments)    Do not give to patient due to hx of stomach bleeds    Losartan  Other (See Comments)    Weakness     Patient Measurements: Height: 5' 7 (170.2 cm) Weight: 67.1 kg (148 lb) IBW/kg (Calculated) : 66.1 HEPARIN DW (KG): 67.1  Vital Signs: Temp: 98.2 F (36.8 C) (10/13 2149) Temp Source: Oral (10/13 2149) BP: 149/67 (10/13 2149) Pulse Rate: 79 (10/13 2149)  Labs: Recent Labs    06/14/24 0912 06/14/24 1003 06/14/24 1218 06/14/24 2104  HGB 12.1* 12.6*  --   --   HCT 37.5* 37.0*  --   --   PLT 382  --   --   --   APTT  --   --   --  41*  HEPARINUNFRC  --   --   --  0.34  CREATININE 0.71 0.70  --   --   TROPONINIHS 16  --  18*  --     Estimated Creatinine Clearance: 72.3 mL/min (by C-G formula based on SCr of 0.7 mg/dL).   Assessment: Patient presenting with CC of abdominal pain, recent knee replacement 10/3 noted that right leg was swollen. Vascular US  showing acute L DVT. Patient on Eliquis 2.5mg  BID post-op for DVT ppx last dose 10/12 PM per patient. HgB 12.6 and PLTs 382.   HL 0.41 - likely falsely elevated d/t recent DOAC use aPTT 41s - subtherapeutic  Goal of Therapy:  Heparin level 0.3-0.7 units/ml aPTT 66-102 Monitor platelets by anticoagulation protocol: Yes   Plan:  Heparin 2000 units IV x1 then  Increase heparin infusion to 1300 units/hr aPTT in 8 hrs  Daily HL, aPTT until clinically correlated Continue to monitor H&H and platelets  Sharyne Glatter, PharmD, BCCCP Critical Care Clinical Pharmacist 06/14/2024 10:25 PM

## 2024-06-15 ENCOUNTER — Inpatient Hospital Stay (HOSPITAL_COMMUNITY)

## 2024-06-15 ENCOUNTER — Other Ambulatory Visit (HOSPITAL_COMMUNITY): Payer: Self-pay

## 2024-06-15 ENCOUNTER — Telehealth: Payer: Self-pay | Admitting: Orthopaedic Surgery

## 2024-06-15 DIAGNOSIS — R9431 Abnormal electrocardiogram [ECG] [EKG]: Secondary | ICD-10-CM | POA: Diagnosis not present

## 2024-06-15 DIAGNOSIS — I4891 Unspecified atrial fibrillation: Secondary | ICD-10-CM | POA: Diagnosis not present

## 2024-06-15 DIAGNOSIS — R079 Chest pain, unspecified: Secondary | ICD-10-CM

## 2024-06-15 DIAGNOSIS — I824Y2 Acute embolism and thrombosis of unspecified deep veins of left proximal lower extremity: Secondary | ICD-10-CM | POA: Diagnosis not present

## 2024-06-15 LAB — CBC
HCT: 41.1 % (ref 39.0–52.0)
Hemoglobin: 13.6 g/dL (ref 13.0–17.0)
MCH: 31.6 pg (ref 26.0–34.0)
MCHC: 33.1 g/dL (ref 30.0–36.0)
MCV: 95.6 fL (ref 80.0–100.0)
Platelets: 488 K/uL — ABNORMAL HIGH (ref 150–400)
RBC: 4.3 MIL/uL (ref 4.22–5.81)
RDW: 12.7 % (ref 11.5–15.5)
WBC: 7.8 K/uL (ref 4.0–10.5)
nRBC: 0 % (ref 0.0–0.2)

## 2024-06-15 LAB — ECHOCARDIOGRAM COMPLETE
AR max vel: 3.38 cm2
AV Area VTI: 4.69 cm2
AV Area mean vel: 3.36 cm2
AV Mean grad: 2 mmHg
AV Peak grad: 5.6 mmHg
Ao pk vel: 1.18 m/s
Area-P 1/2: 2.56 cm2
Calc EF: 65.4 %
Height: 67 in
MV VTI: 2.25 cm2
S' Lateral: 3.5 cm
Single Plane A2C EF: 65.4 %
Single Plane A4C EF: 64.5 %
Weight: 2368 [oz_av]

## 2024-06-15 LAB — BASIC METABOLIC PANEL WITH GFR
Anion gap: 13 (ref 5–15)
BUN: 14 mg/dL (ref 8–23)
CO2: 20 mmol/L — ABNORMAL LOW (ref 22–32)
Calcium: 8.3 mg/dL — ABNORMAL LOW (ref 8.9–10.3)
Chloride: 101 mmol/L (ref 98–111)
Creatinine, Ser: 0.74 mg/dL (ref 0.61–1.24)
GFR, Estimated: >60 mL/min (ref >60)
Glucose, Bld: 132 mg/dL — ABNORMAL HIGH (ref 70–99)
Potassium: 3.9 mmol/L (ref 3.5–5.1)
Sodium: 134 mmol/L — ABNORMAL LOW (ref 135–145)

## 2024-06-15 LAB — TSH: TSH: 0.727 u[IU]/mL (ref 0.350–4.500)

## 2024-06-15 LAB — HEPARIN LEVEL (UNFRACTIONATED): Heparin Unfractionated: 0.24 [IU]/mL — ABNORMAL LOW (ref 0.30–0.70)

## 2024-06-15 LAB — APTT: aPTT: 38 s — ABNORMAL HIGH (ref 24–36)

## 2024-06-15 MED ORDER — APIXABAN 5 MG PO TABS
10.0000 mg | ORAL_TABLET | Freq: Two times a day (BID) | ORAL | Status: DC
Start: 2024-06-15 — End: 2024-06-22
  Administered 2024-06-15 – 2024-06-16 (×3): 10 mg via ORAL
  Filled 2024-06-15 (×3): qty 2

## 2024-06-15 MED ORDER — BENZONATATE 100 MG PO CAPS
200.0000 mg | ORAL_CAPSULE | Freq: Three times a day (TID) | ORAL | Status: DC | PRN
  Administered 2024-06-15: 200 mg via ORAL
  Filled 2024-06-15: qty 2

## 2024-06-15 MED ORDER — HEPARIN BOLUS VIA INFUSION
2000.0000 [IU] | Freq: Once | INTRAVENOUS | Status: AC
  Administered 2024-06-15: 2000 [IU] via INTRAVENOUS
  Filled 2024-06-15: qty 2000

## 2024-06-15 MED ORDER — APIXABAN 5 MG PO TABS: 5.0000 mg | ORAL_TABLET | Freq: Two times a day (BID) | ORAL | Status: DC

## 2024-06-15 MED ORDER — INFLUENZA VAC SPLIT HIGH-DOSE 0.5 ML IM SUSY
0.5000 mL | PREFILLED_SYRINGE | INTRAMUSCULAR | Status: AC
  Administered 2024-06-16: 0.5 mL via INTRAMUSCULAR
  Filled 2024-06-15: qty 0.5

## 2024-06-15 NOTE — ED Notes (Signed)
 Echo at bedside stated less that 20 minutes til patient able to move. Charge nurse aware.

## 2024-06-15 NOTE — Progress Notes (Signed)
 Rounding Note   Patient Name: [name] Date of Encounter: 06/15/2024  [facility] Cardiologist: Maude Emmer, MD   Subjective BP 151/70.  Denies any chest pain or dyspnea  Scheduled Meds:  budesonide -glycopyrrolate-formoterol   2 puff Inhalation BID   diltiazem  60 mg Oral Q6H   Continuous Infusions:  heparin 1,450 Units/hr (06/15/24 0802)   PRN Meds: ipratropium-albuterol , oxyCODONE  **OR** oxyCODONE    Vital Signs  Vitals:   06/15/24 0400 06/15/24 0600 06/15/24 0634 06/15/24 0640  BP: 138/66 (!) 151/70 (!) 151/70   Pulse: 98 65    Resp: 19 20    Temp:    97.8 F (36.6 C)  TempSrc:    Oral  SpO2: 96% 95%    Weight:      Height:        Intake/Output Summary (Last 24 hours) at 06/15/2024 0808 Last data filed at 06/14/2024 1117 Gross per 24 hour  Intake --  Output 300 ml  Net -300 ml      06/14/2024    9:22 AM 06/08/2024    1:56 PM 06/04/2024    7:06 AM  Last 3 Weights  Weight (lbs) 148 lb 145 lb 145 lb 8.1 oz  Weight (kg) 67.132 kg 65.772 kg 66 kg      Telemetry Intermittent A-fib, otherwise in sinus rhythm with frequent PACs runs of likely atrial tachycardia- Personally Reviewed  ECG  No new ECG- Personally Reviewed  Physical Exam  GEN: No acute distress.   Neck: No JVD Cardiac: RRR, no murmurs, rubs, or gallops.  Respiratory: Clear to auscultation bilaterally. GI: Soft, nontender, non-distended  MS: No edema; No deformity. Neuro:  Nonfocal  Psych: Normal affect   Labs High Sensitivity Troponin:   Recent Labs  Lab 06/14/24 0912 06/14/24 1218  TROPONINIHS 16 18*     Chemistry Recent Labs  Lab 06/14/24 0912 06/14/24 1003  NA 138 137  K 4.2 4.1  CL 106 103  CO2 21*  --   GLUCOSE 98 100*  BUN 11 13  CREATININE 0.71 0.70  CALCIUM 8.5*  --   MG 2.0  --   PROT 5.9*  --   ALBUMIN 2.6*  --   AST 26  --   ALT 24  --   ALKPHOS 50  --   BILITOT 1.3*  --   GFRNONAA >60  --   ANIONGAP 11  --     Lipids No results for  input(s): CHOL, TRIG, HDL, LABVLDL, LDLCALC, CHOLHDL in the last 168 hours.  Hematology Recent Labs  Lab 06/14/24 0912 06/14/24 1003 06/15/24 0640  WBC 6.5  --  7.8  RBC 3.78*  --  4.30  HGB 12.1* 12.6* 13.6  HCT 37.5* 37.0* 41.1  MCV 99.2  --  95.6  MCH 32.0  --  31.6  MCHC 32.3  --  33.1  RDW 12.6  --  12.7  PLT 382  --  488*   Thyroid   Recent Labs  Lab 06/15/24 0640  TSH 0.727    BNP Recent Labs  Lab 06/14/24 0912  BNP 196.2*    DDimer No results for input(s): DDIMER in the last 168 hours.   Radiology  VAS US  LOWER EXTREMITY VENOUS (DVT) (7a-7p) Result Date: 06/14/2024  Lower Venous DVT Study Patient Name:  [name]  Date of Exam:   06/14/2024 Medical Rec #: [id number]        Accession #:    [id number] Date of Birth: Jan 31, 1946  Patient Gender: M Patient Age:   78 years Exam Location:  [facility] Procedure:      VAS US  LOWER EXTREMITY VENOUS (DVT) Referring Phys: ELSIE BODY --------------------------------------------------------------------------------  Indications: RLE pain and swelling. Other Indications: RT TKA 06/04/24. Comparison Study: No previous exams Performing Technologist: Jody Hill RVT, RDMS  Examination Guidelines: A complete evaluation includes B-mode imaging, spectral Doppler, color Doppler, and power Doppler as needed of all accessible portions of each vessel. Bilateral testing is considered an integral part of a complete examination. Limited examinations for reoccurring indications may be performed as noted. The reflux portion of the exam is performed with the patient in reverse Trendelenburg.  +---------+---------------+---------+-----------+----------+--------------+ RIGHT    CompressibilityPhasicitySpontaneityPropertiesThrombus Aging +---------+---------------+---------+-----------+----------+--------------+ CFV      Full           Yes      Yes                                  +---------+---------------+---------+-----------+----------+--------------+ SFJ      Full                                                        +---------+---------------+---------+-----------+----------+--------------+ FV Prox  Full           Yes      Yes                                 +---------+---------------+---------+-----------+----------+--------------+ FV Mid   Full           Yes      Yes                                 +---------+---------------+---------+-----------+----------+--------------+ FV DistalFull           Yes      Yes                                 +---------+---------------+---------+-----------+----------+--------------+ PFV      Full                                                        +---------+---------------+---------+-----------+----------+--------------+ POP      Full           Yes      Yes                                 +---------+---------------+---------+-----------+----------+--------------+ PTV      Full                                                        +---------+---------------+---------+-----------+----------+--------------+ PERO     Full                                                        +---------+---------------+---------+-----------+----------+--------------+   +--------+---------------+---------+-----------+----------+--------------------+  LEFT    CompressibilityPhasicitySpontaneityPropertiesThrombus Aging       +--------+---------------+---------+-----------+----------+--------------------+ CFV     None           No       No                   Acute - distal                                                            portion              +--------+---------------+---------+-----------+----------+--------------------+ SFJ     Partial                                      Acute                +--------+---------------+---------+-----------+----------+--------------------+ FV  Prox Partial        No       No                   Acute                +--------+---------------+---------+-----------+----------+--------------------+ FV Mid  None           No       No                   Acute                +--------+---------------+---------+-----------+----------+--------------------+ FV      None           No       No                   Acute - one of       Distal                                               paired               +--------+---------------+---------+-----------+----------+--------------------+ PFV     Partial        No       No                   Acute                +--------+---------------+---------+-----------+----------+--------------------+ POP     None           No       No                   Acute                +--------+---------------+---------+-----------+----------+--------------------+ PTV     Full                                                              +--------+---------------+---------+-----------+----------+--------------------+  PERO    Full                                                              +--------+---------------+---------+-----------+----------+--------------------+     Summary: BILATERAL: -No evidence of popliteal cyst, bilaterally. RIGHT: - There is no evidence of deep vein thrombosis in the lower extremity.  Subcutaneous edema extending from knee to ankle.  LEFT: - Findings consistent with acute deep vein thrombosis involving the left common femoral vein, SF junction, left femoral vein, left proximal profunda vein, and left popliteal vein.   *See table(s) above for measurements and observations. Electronically signed by Lonni Gaskins MD on 06/14/2024 at 12:54:16 PM.    Final    CT Angio Chest PE W and/or Wo Contrast Result Date: 06/14/2024 CLINICAL DATA:  Chest pain. Concern for pulmonary embolism. Prior right lobectomy. Right lower quadrant abdominal pain. EXAM: CT ANGIOGRAPHY  CHEST CT ABDOMEN AND PELVIS WITH CONTRAST TECHNIQUE: Multidetector CT imaging of the chest was performed using the standard protocol during bolus administration of intravenous contrast. Multiplanar CT image reconstructions and MIPs were obtained to evaluate the vascular anatomy. Multidetector CT imaging of the abdomen and pelvis was performed using the standard protocol during bolus administration of intravenous contrast. RADIATION DOSE REDUCTION: This exam was performed according to the departmental dose-optimization program which includes automated exposure control, adjustment of the mA and/or kV according to patient size and/or use of iterative reconstruction technique. CONTRAST:  75mL OMNIPAQUE  IOHEXOL  350 MG/ML SOLN COMPARISON:  Chest CT dated 11/11/2023. FINDINGS: CTA CHEST FINDINGS Cardiovascular: Mild cardiomegaly. No pericardial effusion. There is moderate calcified and noncalcified plaque of the thoracic aorta. No aneurysmal dilatation. No pulmonary artery embolus identified. Mediastinum/Nodes: No hilar or mediastinal adenopathy. The esophagus is grossly unremarkable. No mediastinal fluid collection. Lungs/Pleura: Postsurgical changes of the right middle and right upper lobectomy. Stable pleural thickening of the right lung base and apex as seen previously. There is background of emphysema. No new consolidation. No pleural effusion or pneumothorax. The central airways remain patent. Musculoskeletal: Bilateral total shoulder arthroplasties. Degenerative changes of the spine. No acute osseous pathology. Review of the MIP images confirms the above findings. CT ABDOMEN and PELVIS FINDINGS No intra-abdominal free air or free fluid. Hepatobiliary: The liver is unremarkable. No acute or dilatation. The gallbladder is unremarkable. Pancreas: Unremarkable. No pancreatic ductal dilatation or surrounding inflammatory changes. Spleen: Normal in size without focal abnormality. Adrenals/Urinary Tract: The adrenal glands  are unremarkable. There is no hydronephrosis on either side. There is symmetric enhancement and excretion of contrast by both kidneys. The visualized ureters and urinary bladder appear unremarkable. Air within the urinary bladder likely introduced via recent catheterization. Stomach/Bowel: There is scattered colonic diverticula. There is no bowel obstruction or active inflammation. The appendix is not identified and may be surgically absent. Correlation with history of appendectomy recommended. Vascular/Lymphatic: Advanced aortoiliac atherosclerotic disease. The IVC is unremarkable. No portal venous gas. There is no adenopathy. Reproductive: Enlarged prostate gland measuring 5.3 cm in transverse axial diameter. The seminal vesicles are symmetric. Other: None Musculoskeletal: Osteopenia with degenerative changes spine. Lumbar laminectomy and posterior fusion. No acute osseous pathology. Review of the MIP images confirms the above findings. IMPRESSION: 1. No acute intrathoracic, abdominal, or pelvic pathology. No CT evidence of pulmonary artery embolus. 2. Postsurgical changes of  the right lung. 3. Colonic diverticulosis. No bowel obstruction. 4.  Aortic Atherosclerosis (ICD10-I70.0). Electronically Signed   By: Vanetta Chou M.D.   On: 06/14/2024 11:06   CT ABDOMEN PELVIS W CONTRAST Result Date: 06/14/2024 CLINICAL DATA:  Chest pain. Concern for pulmonary embolism. Prior right lobectomy. Right lower quadrant abdominal pain. EXAM: CT ANGIOGRAPHY CHEST CT ABDOMEN AND PELVIS WITH CONTRAST TECHNIQUE: Multidetector CT imaging of the chest was performed using the standard protocol during bolus administration of intravenous contrast. Multiplanar CT image reconstructions and MIPs were obtained to evaluate the vascular anatomy. Multidetector CT imaging of the abdomen and pelvis was performed using the standard protocol during bolus administration of intravenous contrast. RADIATION DOSE REDUCTION: This exam was  performed according to the departmental dose-optimization program which includes automated exposure control, adjustment of the mA and/or kV according to patient size and/or use of iterative reconstruction technique. CONTRAST:  75mL OMNIPAQUE  IOHEXOL  350 MG/ML SOLN COMPARISON:  Chest CT dated 11/11/2023. FINDINGS: CTA CHEST FINDINGS Cardiovascular: Mild cardiomegaly. No pericardial effusion. There is moderate calcified and noncalcified plaque of the thoracic aorta. No aneurysmal dilatation. No pulmonary artery embolus identified. Mediastinum/Nodes: No hilar or mediastinal adenopathy. The esophagus is grossly unremarkable. No mediastinal fluid collection. Lungs/Pleura: Postsurgical changes of the right middle and right upper lobectomy. Stable pleural thickening of the right lung base and apex as seen previously. There is background of emphysema. No new consolidation. No pleural effusion or pneumothorax. The central airways remain patent. Musculoskeletal: Bilateral total shoulder arthroplasties. Degenerative changes of the spine. No acute osseous pathology. Review of the MIP images confirms the above findings. CT ABDOMEN and PELVIS FINDINGS No intra-abdominal free air or free fluid. Hepatobiliary: The liver is unremarkable. No acute or dilatation. The gallbladder is unremarkable. Pancreas: Unremarkable. No pancreatic ductal dilatation or surrounding inflammatory changes. Spleen: Normal in size without focal abnormality. Adrenals/Urinary Tract: The adrenal glands are unremarkable. There is no hydronephrosis on either side. There is symmetric enhancement and excretion of contrast by both kidneys. The visualized ureters and urinary bladder appear unremarkable. Air within the urinary bladder likely introduced via recent catheterization. Stomach/Bowel: There is scattered colonic diverticula. There is no bowel obstruction or active inflammation. The appendix is not identified and may be surgically absent. Correlation with  history of appendectomy recommended. Vascular/Lymphatic: Advanced aortoiliac atherosclerotic disease. The IVC is unremarkable. No portal venous gas. There is no adenopathy. Reproductive: Enlarged prostate gland measuring 5.3 cm in transverse axial diameter. The seminal vesicles are symmetric. Other: None Musculoskeletal: Osteopenia with degenerative changes spine. Lumbar laminectomy and posterior fusion. No acute osseous pathology. Review of the MIP images confirms the above findings. IMPRESSION: 1. No acute intrathoracic, abdominal, or pelvic pathology. No CT evidence of pulmonary artery embolus. 2. Postsurgical changes of the right lung. 3. Colonic diverticulosis. No bowel obstruction. 4.  Aortic Atherosclerosis (ICD10-I70.0). Electronically Signed   By: Vanetta Chou M.D.   On: 06/14/2024 11:06    Cardiac Studies   Patient Profile   78 y.o. 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  Initially appeared in sinus rhythm with frequent PACs and runs of SVT, but then went into A-fib in ED. on review of telemetry, appears he is going in and out of atrial fibrillation, otherwise in sinus with frequent  PACs - Currently on IV heparin.  Can switch to Eliquis, would start on 10 mg twice daily x 1 week then decrease to 5 mg twice daily given his acute DVT - Check echocardiogram - Continue diltiazem 60 mg every 6 hours.  If echo shows normal EF, will consolidate to long-acting diltiazem.  If EF reduced on echo, will switch to beta-blocker, though will need to be cautious given his COPD  Acute DVT: In setting of recent knee replacement.  No PE on CTPA.  Currently on IV heparin, can switch to Eliquis as above  Hypertension: Continue diltiazem  For questions or  updates, please contact [facility] Please consult [email or web address] for contact info under    Signed, Lonni LITTIE Nanas, MD  06/15/2024, 8:08 AM

## 2024-06-15 NOTE — ED Notes (Signed)
Walked patient to the bathroom patient did well 

## 2024-06-15 NOTE — Progress Notes (Addendum)
 PHARMACY - ANTICOAGULATION CONSULT NOTE  Pharmacy Consult for heparin  Indication: DVT and new Afib   Allergies  Allergen Reactions   Aspirin  Other (See Comments)    Stomach bleeding    Meloxicam  Other (See Comments)    Caused Stomach Bleeds    Nsaids Other (See Comments)    Do not give to patient due to hx of stomach bleeds    Losartan  Other (See Comments)    Weakness     Patient Measurements: Height: 5' 7 (170.2 cm) Weight: 67.1 kg (148 lb) IBW/kg (Calculated) : 66.1 HEPARIN DW (KG): 67.1  Vital Signs: Temp: 97.8 F (36.6 C) (10/14 0640) Temp Source: Oral (10/14 0640) BP: 151/70 (10/14 0634) Pulse Rate: 65 (10/14 0600)  Labs: Recent Labs    06/14/24 0912 06/14/24 1003 06/14/24 1218 06/14/24 2104 06/15/24 0640  HGB 12.1* 12.6*  --   --  13.6  HCT 37.5* 37.0*  --   --  41.1  PLT 382  --   --   --  488*  APTT  --   --   --  41* 38*  HEPARINUNFRC  --   --   --  0.34 0.24*  CREATININE 0.71 0.70  --   --   --   TROPONINIHS 16  --  18*  --   --     Estimated Creatinine Clearance: 72.3 mL/min (by C-G formula based on SCr of 0.7 mg/dL).   Assessment: Patient presenting with CC of abdominal pain, recent knee replacement 10/3 noted that right leg was swollen. Vascular US  showing acute L DVT. Patient on Eliquis 2.5mg  BID post-op for DVT ppx last dose 10/12 PM per patient. HgB 12.6 and PLTs 382.   aPTT 38 and heparin level 0.24 while on heparin 1300u/hr. Heparin level likely still falsely elevated. No issues or pauses with heparin infusion overnight per RN.   Goal of Therapy:  Heparin level 0.3-0.7 units/ml aPTT 66-102 Monitor platelets by anticoagulation protocol: Yes   Plan:  Heparin 2000 units IV x1 then  Increase heparin infusion to 1450 units/hr aPTT in 8 hrs  Daily HL, aPTT until clinically correlated Continue to monitor H&H and platelets  Powell Blush, PharmD, BCCCP  Critical Care Clinical Pharmacist 06/15/2024 7:33 AM   ADDENDUM:   Consulted  to dose Eliquis. Paitent with acute VTE + new Afib. Patient on Eliquis PTA following knee replacement for DVT ppx.   Start Eliquis 10mg  BID x 7 days, followed by Eliquis 5mg  BID.   Powell Blush, PharmD, BCCCP

## 2024-06-15 NOTE — Telephone Encounter (Signed)
 Patient called and said he is in the hospital and wants to know if [facility] can come see him . [phone]  His [address]. He may be leaving tomorrow.

## 2024-06-15 NOTE — Progress Notes (Signed)
 Waiting for pharmacy to bring up Breztri

## 2024-06-15 NOTE — Evaluation (Signed)
 Physical Therapy Evaluation Patient Details Name: SHAWNTEZ DICKISON MRN: [id number] DOB: 01-Apr-1946 Today's Date: 06/15/2024  History of Present Illness  Pt is a 78 y.o. male who presented to [facility] ED 06/14/24 with abdominal pain, right groin pain, and swelling in leg. CTA chest PE neg for acute PT. BLE US  showed findings consistent with acute deep vein thrombosis involving the left common femoral vein, SF junction, left femoral vein, left proximal profunda vein, and left popliteal vein. Work-up revealed new onset A-Fib. PMHx: DDD (cervical and lumbar spine), HTN, frequent PVCs, COPD, lung abscess in 1996 s/p right middle and lower lobe resection, GERD, OA s/p R TKA (06/04/24).   Clinical Impression  Pt admitted with above diagnosis. PTA, pt was modI for functional mobility with RW and modI with ADLs/IADLs. He lives with his wife in a one story house with 3 STE. Pt currently with functional limitations due to the deficits listed below (see PT Problem List). He performed bed mobility with supervision and required CGA for transfers and gait using RW. He ambulated a short household distance with an antalgic gait pattern. Pt is currently limited by pain, edema, decreased RLE ROM/strength, decreased balance, and reduced activity tolerance. Pt will benefit from acute skilled PT to increase their independence and safety with mobility to allow discharge. Recommend HHPT to increase ROM/strength, decreased pain, improve balance, decrease fall risk, advance activity tolerance, and optimize safety within the home environment.      If plan is discharge home, recommend the following: A little help with walking and/or transfers;A little help with bathing/dressing/bathroom;Assistance with cooking/housework;Assist for transportation;Help with stairs or ramp for entrance   Can travel by private vehicle        Equipment Recommendations None recommended by PT  Recommendations for Other Services       Functional Status  Assessment Patient has had a recent decline in their functional status and demonstrates the ability to make significant improvements in function in a reasonable and predictable amount of time.     Precautions / Restrictions Precautions Precautions: Knee;Fall Precaution Booklet Issued: No (Given at prior admission) Recall of Precautions/Restrictions: Impaired Precaution/Restrictions Comments: Pt greeted with pillow under right knee. Educated pt to have pillow under ankle and maintain knee in full extension at rest Restrictions Weight Bearing Restrictions Per Provider Order: No      Mobility  Bed Mobility Overal bed mobility: Needs Assistance Bed Mobility: Supine to Sit, Sit to Supine     Supine to sit: Supervision, HOB elevated Sit to supine: Supervision, HOB elevated   General bed mobility comments: Pt sat up on R side of stretcher with increased time. He managed BLE and trunk in/out of bed.    Transfers Overall transfer level: Needs assistance Equipment used: Rolling walker (2 wheels) Transfers: Sit to/from Stand Sit to Stand: Contact guard assist           General transfer comment: Pt stood from stretcher. Cued proper hand placement using RW. Powered up wtih light assist. Good eccentric control.    Ambulation/Gait Ambulation/Gait assistance: Contact guard assist Gait Distance (Feet): 40 Feet Assistive device: Rolling walker (2 wheels) Gait Pattern/deviations: Step-to pattern, Decreased step length - right, Decreased step length - left, Decreased stance time - right, Decreased weight shift to right, Antalgic Gait velocity: decreased Gait velocity interpretation: <1.31 ft/sec, indicative of household ambulator   General Gait Details: Pt ambulated with slow cautious steps. He maintained RLE in knee ext and circumducted to advance leg. Cues for increased hip/knee flex and  heel strike. Pt maintained body inside RW and navigated room/hallway well, no LOB.  Stairs             Wheelchair Mobility     Tilt Bed    Modified Rankin (Stroke Patients Only)       Balance Overall balance assessment: Needs assistance Sitting-balance support: No upper extremity supported, Feet supported Sitting balance-Leahy Scale: Fair     Standing balance support: Bilateral upper extremity supported, During functional activity, Reliant on assistive device for balance Standing balance-Leahy Scale: Poor                               Pertinent Vitals/Pain Pain Assessment Pain Assessment: Faces Faces Pain Scale: Hurts even more Pain Location: RLE primarily the knee but from hip to foot Pain Descriptors / Indicators: Discomfort, Aching, Sore Pain Intervention(s): Monitored during session, Limited activity within patient's tolerance, Repositioned    Home Living Family/patient expects to be discharged to:: Private residence Living Arrangements: Spouse/significant other (Wife) Available Help at Discharge: Family;Available 24 hours/day Type of Home: House Home Access: Stairs to enter Entrance Stairs-Rails: Right;Left;Can reach both Entrance Stairs-Number of Steps: 3   Home Layout: One level Home Equipment: Cane - single point;Grab bars - toilet;Rolling Walker (2 wheels);Shower seat;Grab bars - tub/shower;Hand held shower head;Other (comment) (powered recliner chair)      Prior Function Prior Level of Function : Independent/Modified Independent             Mobility Comments: ModI using RW. Denies fall hx. ADLs Comments: ModI with ADLs. Wife assists him with getting in/out of the shower and using shower chair for safety. Hasn't driven since begining of October d/t surgery.     Extremity/Trunk Assessment   Upper Extremity Assessment Upper Extremity Assessment: Overall WFL for tasks assessed;Right hand dominant    Lower Extremity Assessment Lower Extremity Assessment: RLE deficits/detail RLE Deficits / Details: Pt POD 11 s/p TKA. Hip and Ankle  AROM WFL. Decreased hip and knee strength, grossly 3/5. Ankle strength WFL. RLE: Unable to fully assess due to pain RLE Sensation: WNL RLE Coordination: decreased gross motor    Cervical / Trunk Assessment Cervical / Trunk Assessment: Normal  Communication   Communication Communication: No apparent difficulties    Cognition Arousal: Alert Behavior During Therapy: WFL for tasks assessed/performed   PT - Cognitive impairments: No apparent impairments                       PT - Cognition Comments: Pt A,Ox4 Following commands: Intact       Cueing Cueing Techniques: Verbal cues     General Comments General comments (skin integrity, edema, etc.): VSS on RA. R knee edema.    Exercises     Assessment/Plan    PT Assessment Patient needs continued PT services  PT Problem List Decreased strength;Decreased activity tolerance;Decreased range of motion;Decreased balance;Decreased mobility;Decreased coordination;Pain       PT Treatment Interventions DME instruction;Gait training;Stair training;Functional mobility training;Therapeutic activities;Therapeutic exercise;Balance training;Neuromuscular re-education;Patient/family education    PT Goals (Current goals can be found in the Care Plan section)  Acute Rehab PT Goals Patient Stated Goal: Return Home PT Goal Formulation: With patient/family Time For Goal Achievement: 06/29/24 Potential to Achieve Goals: Good    Frequency Min 2X/week     Co-evaluation               AM-PAC PT 6 Clicks Mobility  Outcome Measure  Help needed turning from your back to your side while in a flat bed without using bedrails?: A Little Help needed moving from lying on your back to sitting on the side of a flat bed without using bedrails?: A Little Help needed moving to and from a bed to a chair (including a wheelchair)?: A Little Help needed standing up from a chair using your arms (e.g., wheelchair or bedside chair)?: A  Little Help needed to walk in hospital room?: A Little Help needed climbing 3-5 steps with a railing? : A Lot 6 Click Score: 17    End of Session Equipment Utilized During Treatment: Gait belt Activity Tolerance: Patient tolerated treatment well Patient left: in bed;with call bell/phone within reach;with family/visitor present Nurse Communication: Mobility status PT Visit Diagnosis: Difficulty in walking, not elsewhere classified (R26.2);Other abnormalities of gait and mobility (R26.89);Pain;Unsteadiness on feet (R26.81) Pain - Right/Left: Right Pain - part of body: Knee    Time: 8696-8683 PT Time Calculation (min) (ACUTE ONLY): 13 min   Charges:   PT Evaluation $PT Eval Low Complexity: 1 Low   PT General Charges $$ ACUTE PT VISIT: 1 Visit         Randall SAUNDERS, PT, DPT Acute Rehabilitation Services Office: [phone] Secure Chat Preferred  Delon CHRISTELLA Callander 06/15/2024, 1:53 PM

## 2024-06-15 NOTE — Progress Notes (Signed)
 PROGRESS NOTE  [name] FMW:[id number] DOB: 02/19/1946 DOA: 06/14/2024 PCP: Domenica Harlene LABOR, MD   LOS: 1 day   Brief Narrative / Interim history: 78 year old male with COPD, HTN, OA, recent right total knee replacement on 10/3 comes into the hospital with abdominal pain.  He reports abdominal pain on the right side, mainly when he moves his right leg.  In the ER he was found to have A-fib with RVR, and lower extremity Dopplers due to right lower extremity swelling actually showed left lower extremity acute DVT.  He was placed on heparin, cardiology was consulted for his A-fib and he was admitted to the hospitalist service  Subjective / 24h Interval events: He is feeling well this morning, denies any chest pain, denies any shortness of breath.  He reports his abdominal pain is better.  He was concerned initially about having recurrence of his hernia  Assesement and Plan: Principal problem A-fib with RVR -cardiology consulted and following.  Overnight he was placed on diltiazem infusion, he is rate controlled this morning and has been placed on short acting oral diltiazem 60 mg 4 times daily.  A 2D echocardiogram has been ordered and is pending, if the EF is normal his diltiazem will be consolidated, if abnormal may benefit from beta-blockers instead.  Defer to cardiology, continue to monitor rates on telemetry  Active problems Acute left lower extremity DVT -ultrasound showed acute DVT in the left common femoral vein, SF junction, left femoral vein, left proximal profunda vein and left popliteal vein.  Has been maintained on heparin overnight, he does have a history of ulcer with bleeding in the past in the setting of heavy NSAID use.  Currently he is without bleeding, no longer using NSAIDs, transition to Eliquis  Abdominal pain-suspect muscle straining in the light of recent procedure.  CT of the abdomen and pelvis is without acute findings.  Continue supportive care and pain  control  Essential hypertension-continue diltiazem  Recent total right knee replacement-outpatient follow-up  Scheduled Meds:  apixaban  10 mg Oral BID   Followed by   NOREEN ON 06/22/2024] apixaban  5 mg Oral BID   budesonide -glycopyrrolate-formoterol   2 puff Inhalation BID   diltiazem  60 mg Oral Q6H   Continuous Infusions: PRN Meds:.ipratropium-albuterol , oxyCODONE  **OR** oxyCODONE   Current Outpatient Medications  Medication Instructions   acetaminophen  (TYLENOL ) 500-1,000 mg, See admin instructions   albuterol  (VENTOLIN  HFA) 108 (90 Base) MCG/ACT inhaler 2 puffs, Inhalation, Every 6 hours PRN   amLODipine  (NORVASC ) 5 mg, Oral, Daily   apixaban (ELIQUIS) 2.5 mg, Oral, Every 12 hours   benzonatate  (TESSALON ) 100 MG capsule TAKE 1 CAPSULE BY MOUTH TWICE DAILY AS NEEDED FOR COUGH   CALCIUM CARBONATE ANTACID PO 1,000 mg, Every morning   cetirizine (ZYRTEC) 10 mg, Every morning   clobetasol (TEMOVATE) 0.05 % external solution 1 Application, Topical, See admin instructions, Apply to the affected areas 2 times a day as needed for irritation   fluticasone  (FLONASE ) 50 MCG/ACT nasal spray 2 sprays, Each Nare, Daily   GLUCOSAMINE-CHONDROITIN PO 1 tablet, 2 times daily   guaiFENesin  (MUCINEX ) 600 mg, 2 times daily   ipratropium (ATROVENT ) 0.06 % nasal spray 1 spray, Each Nare, 4 times daily   ketoconazole (NIZORAL) 2 % shampoo 1 Application, Topical, See admin instructions, Shampoo 2 times a week   loratadine (CLARITIN) 10 mg, Every evening   methocarbamol (ROBAXIN) 500 mg, Oral, Every 6 hours PRN   montelukast  (SINGULAIR ) 10 mg, Oral, Daily   Multiple  Vitamin (MULTIVITAMIN WITH MINERALS) TABS tablet 1 tablet, Daily with breakfast   oxyCODONE  (OXY IR/ROXICODONE ) 5-10 mg, Oral, Every 6 hours PRN, No more than 6 tablets daily.   pantoprazole  (PROTONIX ) 20 mg, Oral, Daily   TRELEGY ELLIPTA  100-62.5-25 MCG/ACT AEPB 1 puff, Inhalation, Daily   triamcinolone  cream (KENALOG ) 0.1 % 1  Application, Topical, 2 times daily PRN    Diet Orders (From admission, onward)     Start     Ordered   06/14/24 1617  Diet regular Room service appropriate? Yes; Fluid consistency: Thin  Diet effective now       Question Answer Comment  Room service appropriate? Yes   Fluid consistency: Thin      06/14/24 1616            DVT prophylaxis:  apixaban (ELIQUIS) tablet 10 mg  apixaban (ELIQUIS) tablet 5 mg   Lab Results  Component Value Date   PLT 488 (H) 06/15/2024      Code Status: Full Code  Family Communication: No family at bedside  Status is: Inpatient Remains inpatient appropriate because: Severity of illness   Level of care: Progressive  Consultants:  Cardiology  Objective: Vitals:   06/15/24 0640 06/15/24 0841 06/15/24 1037 06/15/24 1038  BP:  113/69    Pulse:  (!) 120 78 80  Resp:  (!) 23 18 18   Temp: 97.8 F (36.6 C)     TempSrc: Oral     SpO2:  97% 96% 96%  Weight:      Height:        Intake/Output Summary (Last 24 hours) at 06/15/2024 1117 Last data filed at 06/15/2024 0917 Gross per 24 hour  Intake 294.14 ml  Output --  Net 294.14 ml   Wt Readings from Last 3 Encounters:  06/14/24 67.1 kg  06/08/24 65.8 kg  06/04/24 66 kg    Examination:  Constitutional: NAD Eyes: no scleral icterus ENMT: Mucous membranes are moist.  Neck: normal, supple Respiratory: clear to auscultation bilaterally, no wheezing, no crackles.  Cardiovascular: Regular, tachycardic, edema / swelling on the right lower extremity Abdomen: non distended, no tenderness. Bowel sounds positive.  Musculoskeletal: no clubbing / cyanosis.    Data Reviewed: I have independently reviewed following labs and imaging studies   CBC Recent Labs  Lab 06/14/24 0912 06/14/24 1003 06/15/24 0640  WBC 6.5  --  7.8  HGB 12.1* 12.6* 13.6  HCT 37.5* 37.0* 41.1  PLT 382  --  488*  MCV 99.2  --  95.6  MCH 32.0  --  31.6  MCHC 32.3  --  33.1  RDW 12.6  --  12.7  LYMPHSABS  1.1  --   --   MONOABS 0.6  --   --   EOSABS 0.1  --   --   BASOSABS 0.0  --   --     Recent Labs  Lab 06/14/24 0912 06/14/24 1003 06/15/24 0640 06/15/24 0844  NA 138 137  --  134*  K 4.2 4.1  --  3.9  CL 106 103  --  101  CO2 21*  --   --  20*  GLUCOSE 98 100*  --  132*  BUN 11 13  --  14  CREATININE 0.71 0.70  --  0.74  CALCIUM 8.5*  --   --  8.3*  AST 26  --   --   --   ALT 24  --   --   --   ALKPHOS 50  --   --   --  BILITOT 1.3*  --   --   --   ALBUMIN 2.6*  --   --   --   MG 2.0  --   --   --   TSH  --   --  0.727  --   BNP 196.2*  --   --   --     ------------------------------------------------------------------------------------------------------------------ No results for input(s): CHOL, HDL, LDLCALC, TRIG, CHOLHDL, LDLDIRECT in the last 72 hours.  Lab Results  Component Value Date   HGBA1C 5.9 05/26/2023   ------------------------------------------------------------------------------------------------------------------ Recent Labs    06/15/24 0640  TSH 0.727    Cardiac Enzymes No results for input(s): CKMB, TROPONINI, MYOGLOBIN in the last 168 hours.  Invalid input(s): CK ------------------------------------------------------------------------------------------------------------------    Component Value Date/Time   BNP 196.2 (H) 06/14/2024 0912    CBG: No results for input(s): GLUCAP in the last 168 hours.  No results found for this or any previous visit (from the past 240 hours).   Radiology Studies: VAS US  LOWER EXTREMITY VENOUS (DVT) (7a-7p) Result Date: 06/14/2024  Lower Venous DVT Study Patient Name:  KORREY SCHLEICHER  Date of Exam:   06/14/2024 Medical Rec #: [id number]        Accession #:    [id number] Date of Birth: 1946-04-30         Patient Gender: M Patient Age:   53 years Exam Location:  [facility] Procedure:      VAS US  LOWER EXTREMITY VENOUS (DVT) Referring Phys: ELSIE BODY  --------------------------------------------------------------------------------  Indications: RLE pain and swelling. Other Indications: RT TKA 06/04/24. Comparison Study: No previous exams Performing Technologist: Jody Hill RVT, RDMS  Examination Guidelines: A complete evaluation includes B-mode imaging, spectral Doppler, color Doppler, and power Doppler as needed of all accessible portions of each vessel. Bilateral testing is considered an integral part of a complete examination. Limited examinations for reoccurring indications may be performed as noted. The reflux portion of the exam is performed with the patient in reverse Trendelenburg.  +---------+---------------+---------+-----------+----------+--------------+ RIGHT    CompressibilityPhasicitySpontaneityPropertiesThrombus Aging +---------+---------------+---------+-----------+----------+--------------+ CFV      Full           Yes      Yes                                 +---------+---------------+---------+-----------+----------+--------------+ SFJ      Full                                                        +---------+---------------+---------+-----------+----------+--------------+ FV Prox  Full           Yes      Yes                                 +---------+---------------+---------+-----------+----------+--------------+ FV Mid   Full           Yes      Yes                                 +---------+---------------+---------+-----------+----------+--------------+ FV DistalFull           Yes      Yes                                 +---------+---------------+---------+-----------+----------+--------------+  PFV      Full                                                        +---------+---------------+---------+-----------+----------+--------------+ POP      Full           Yes      Yes                                 +---------+---------------+---------+-----------+----------+--------------+ PTV       Full                                                        +---------+---------------+---------+-----------+----------+--------------+ PERO     Full                                                        +---------+---------------+---------+-----------+----------+--------------+   +--------+---------------+---------+-----------+----------+--------------------+ LEFT    CompressibilityPhasicitySpontaneityPropertiesThrombus Aging       +--------+---------------+---------+-----------+----------+--------------------+ CFV     None           No       No                   Acute - distal                                                            portion              +--------+---------------+---------+-----------+----------+--------------------+ SFJ     Partial                                      Acute                +--------+---------------+---------+-----------+----------+--------------------+ FV Prox Partial        No       No                   Acute                +--------+---------------+---------+-----------+----------+--------------------+ FV Mid  None           No       No                   Acute                +--------+---------------+---------+-----------+----------+--------------------+ FV      None           No       No                   Acute - one of  Distal                                               paired               +--------+---------------+---------+-----------+----------+--------------------+ PFV     Partial        No       No                   Acute                +--------+---------------+---------+-----------+----------+--------------------+ POP     None           No       No                   Acute                +--------+---------------+---------+-----------+----------+--------------------+ PTV     Full                                                               +--------+---------------+---------+-----------+----------+--------------------+ PERO    Full                                                              +--------+---------------+---------+-----------+----------+--------------------+     Summary: BILATERAL: -No evidence of popliteal cyst, bilaterally. RIGHT: - There is no evidence of deep vein thrombosis in the lower extremity.  Subcutaneous edema extending from knee to ankle.  LEFT: - Findings consistent with acute deep vein thrombosis involving the left common femoral vein, SF junction, left femoral vein, left proximal profunda vein, and left popliteal vein.   *See table(s) above for measurements and observations. Electronically signed by Lonni Gaskins MD on 06/14/2024 at 12:54:16 PM.    Final      Nilda Fendt, MD, PhD [facility]  Between 7 am - 7 pm I am available, please contact me via Amion (for emergencies) or Securechat (non urgent messages)  Between 7 pm - 7 am I am not available, please contact night coverage MD/APP via Amion

## 2024-06-16 ENCOUNTER — Other Ambulatory Visit: Payer: Self-pay

## 2024-06-16 ENCOUNTER — Other Ambulatory Visit (HOSPITAL_COMMUNITY): Payer: Self-pay

## 2024-06-16 ENCOUNTER — Inpatient Hospital Stay

## 2024-06-16 DIAGNOSIS — I4891 Unspecified atrial fibrillation: Secondary | ICD-10-CM

## 2024-06-16 DIAGNOSIS — I824Y2 Acute embolism and thrombosis of unspecified deep veins of left proximal lower extremity: Secondary | ICD-10-CM | POA: Diagnosis not present

## 2024-06-16 LAB — BASIC METABOLIC PANEL WITH GFR
Anion gap: 11 (ref 5–15)
BUN: 12 mg/dL (ref 8–23)
CO2: 22 mmol/L (ref 22–32)
Calcium: 8.7 mg/dL — ABNORMAL LOW (ref 8.9–10.3)
Chloride: 101 mmol/L (ref 98–111)
Creatinine, Ser: 0.73 mg/dL (ref 0.61–1.24)
GFR, Estimated: >60 mL/min (ref >60)
Glucose, Bld: 119 mg/dL — ABNORMAL HIGH (ref 70–99)
Potassium: 3.8 mmol/L (ref 3.5–5.1)
Sodium: 134 mmol/L — ABNORMAL LOW (ref 135–145)

## 2024-06-16 LAB — CBC
HCT: 36.4 % — ABNORMAL LOW (ref 39.0–52.0)
Hemoglobin: 12.2 g/dL — ABNORMAL LOW (ref 13.0–17.0)
MCH: 31.9 pg (ref 26.0–34.0)
MCHC: 33.5 g/dL (ref 30.0–36.0)
MCV: 95 fL (ref 80.0–100.0)
Platelets: 455 K/uL — ABNORMAL HIGH (ref 150–400)
RBC: 3.83 MIL/uL — ABNORMAL LOW (ref 4.22–5.81)
RDW: 12.6 % (ref 11.5–15.5)
WBC: 7.6 K/uL (ref 4.0–10.5)
nRBC: 0 % (ref 0.0–0.2)

## 2024-06-16 MED ORDER — DILTIAZEM HCL ER COATED BEADS 120 MG PO CP24
240.0000 mg | ORAL_CAPSULE | Freq: Every day | ORAL | Status: DC
Start: 2024-06-16 — End: 2024-06-16
  Administered 2024-06-16: 240 mg via ORAL
  Filled 2024-06-16: qty 2

## 2024-06-16 MED ORDER — APIXABAN (ELIQUIS) VTE STARTER PACK (10MG AND 5MG)
ORAL_TABLET | ORAL | 0 refills | Status: DC
  Filled 2024-06-16: qty 74, 30d supply, fill #0

## 2024-06-16 MED ORDER — DILTIAZEM HCL ER COATED BEADS 240 MG PO CP24
240.0000 mg | ORAL_CAPSULE | Freq: Every day | ORAL | 0 refills | Status: DC
  Filled 2024-06-16: qty 30, 30d supply, fill #0

## 2024-06-16 NOTE — Plan of Care (Signed)
  Problem: Education: Goal: Knowledge of General Education information will improve Description: Including pain rating scale, medication(s)/side effects and non-pharmacologic comfort measures 06/16/2024 0502 by Dariel Pellecchia G, RN Outcome: Progressing 06/16/2024 0502 by Delbra Dain MATSU, RN Outcome: Progressing   Problem: Health Behavior/Discharge Planning: Goal: Ability to manage health-related needs will improve 06/16/2024 0502 by Delbra Dain MATSU, RN Outcome: Progressing 06/16/2024 0502 by Delbra Dain MATSU, RN Outcome: Progressing   Problem: Clinical Measurements: Goal: Ability to maintain clinical measurements within normal limits will improve 06/16/2024 0502 by Delbra Dain MATSU, RN Outcome: Progressing 06/16/2024 0502 by Delbra Dain MATSU, RN Outcome: Progressing Goal: Will remain free from infection 06/16/2024 0502 by Delbra Dain MATSU, RN Outcome: Progressing 06/16/2024 0502 by Delbra Dain MATSU, RN Outcome: Progressing Goal: Diagnostic test results will improve 06/16/2024 0502 by Delbra Dain MATSU, RN Outcome: Progressing 06/16/2024 0502 by Delbra Dain MATSU, RN Outcome: Progressing Goal: Respiratory complications will improve 06/16/2024 0502 by Delbra Dain MATSU, RN Outcome: Progressing 06/16/2024 0502 by Delbra Dain MATSU, RN Outcome: Progressing Goal: Cardiovascular complication will be avoided 06/16/2024 0502 by Joscelyne Renville G, RN Outcome: Progressing 06/16/2024 0502 by Jaekwon Mcclune G, RN Outcome: Progressing   Problem: Activity: Goal: Risk for activity intolerance will decrease 06/16/2024 0502 by Delbra Dain MATSU, RN Outcome: Progressing 06/16/2024 0502 by Delbra Dain MATSU, RN Outcome: Progressing   Problem: Nutrition: Goal: Adequate nutrition will be maintained 06/16/2024 0502 by Delbra Dain MATSU, RN Outcome: Progressing 06/16/2024 0502 by Delbra Dain MATSU,  RN Outcome: Progressing   Problem: Coping: Goal: Level of anxiety will decrease 06/16/2024 0502 by Delbra Dain MATSU, RN Outcome: Progressing 06/16/2024 0502 by Delbra Dain MATSU, RN Outcome: Progressing   Problem: Elimination: Goal: Will not experience complications related to bowel motility 06/16/2024 0502 by Delbra Dain MATSU, RN Outcome: Progressing 06/16/2024 0502 by Delbra Dain MATSU, RN Outcome: Progressing Goal: Will not experience complications related to urinary retention 06/16/2024 0502 by Delbra Dain MATSU, RN Outcome: Progressing 06/16/2024 0502 by Delbra Dain MATSU, RN Outcome: Progressing   Problem: Pain Managment: Goal: General experience of comfort will improve and/or be controlled 06/16/2024 0502 by Valia Wingard G, RN Outcome: Progressing 06/16/2024 0502 by Delbra Dain MATSU, RN Outcome: Progressing   Problem: Safety: Goal: Ability to remain free from injury will improve 06/16/2024 0502 by Delbra Dain MATSU, RN Outcome: Progressing 06/16/2024 0502 by Delbra Dain MATSU, RN Outcome: Progressing   Problem: Skin Integrity: Goal: Risk for impaired skin integrity will decrease 06/16/2024 0502 by Delbra Dain MATSU, RN Outcome: Progressing 06/16/2024 0502 by Delbra Dain MATSU, RN Outcome: Progressing

## 2024-06-16 NOTE — Progress Notes (Signed)
 Rounding Note   Patient Name: [name] Date of Encounter: 06/16/2024  [facility] Cardiologist: Maude Emmer, MD   Subjective BP 146/97.  Denies any chest pain or dyspnea  Scheduled Meds:  apixaban  10 mg Oral BID   Followed by   NOREEN ON 06/22/2024] apixaban  5 mg Oral BID   budesonide -glycopyrrolate-formoterol   2 puff Inhalation BID   diltiazem  60 mg Oral Q6H   Influenza vac split trivalent PF  0.5 mL Intramuscular Tomorrow-1000   Continuous Infusions:   PRN Meds: benzonatate , ipratropium-albuterol , oxyCODONE  **OR** oxyCODONE    Vital Signs  Vitals:   06/16/24 0013 06/16/24 0200 06/16/24 0519 06/16/24 0753  BP: 125/79   (!) 146/97  Pulse:  82  90  Resp: 20 (!) 24  18  Temp: 97.9 F (36.6 C)  98.2 F (36.8 C) 97.8 F (36.6 C)  TempSrc: Oral  Oral Oral  SpO2:  95%  97%  Weight:      Height:        Intake/Output Summary (Last 24 hours) at 06/16/2024 1106 Last data filed at 06/16/2024 0000 Gross per 24 hour  Intake --  Output 175 ml  Net -175 ml      06/14/2024    9:22 AM 06/08/2024    1:56 PM 06/04/2024    7:06 AM  Last 3 Weights  Weight (lbs) 148 lb 145 lb 145 lb 8.1 oz  Weight (kg) 67.132 kg 65.772 kg 66 kg      Telemetry Afib Rates well controlled- Personally Reviewed  ECG  No new ECG- Personally Reviewed  Physical Exam  GEN: No acute distress.   Neck: No JVD Cardiac: irregular, no murmurs, rubs, or gallops.  Respiratory: Clear to auscultation bilaterally. GI: Soft, nontender, non-distended  MS: No edema; No deformity. Neuro:  Nonfocal  Psych: Normal affect   Labs High Sensitivity Troponin:   Recent Labs  Lab 06/14/24 0912 06/14/24 1218  TROPONINIHS 16 18*     Chemistry Recent Labs  Lab 06/14/24 0912 06/14/24 1003 06/15/24 0844 06/16/24 0348  NA 138 137 134* 134*  K 4.2 4.1 3.9 3.8  CL 106 103 101 101  CO2 21*  --  20* 22  GLUCOSE 98 100* 132* 119*  BUN 11 13 14 12   CREATININE 0.71 0.70 0.74 0.73   CALCIUM 8.5*  --  8.3* 8.7*  MG 2.0  --   --   --   PROT 5.9*  --   --   --   ALBUMIN 2.6*  --   --   --   AST 26  --   --   --   ALT 24  --   --   --   ALKPHOS 50  --   --   --   BILITOT 1.3*  --   --   --   GFRNONAA >60  --  >60 >60  ANIONGAP 11  --  13 11    Lipids No results for input(s): CHOL, TRIG, HDL, LABVLDL, LDLCALC, CHOLHDL in the last 168 hours.  Hematology Recent Labs  Lab 06/14/24 0912 06/14/24 1003 06/15/24 0640 06/16/24 0348  WBC 6.5  --  7.8 7.6  RBC 3.78*  --  4.30 3.83*  HGB 12.1* 12.6* 13.6 12.2*  HCT 37.5* 37.0* 41.1 36.4*  MCV 99.2  --  95.6 95.0  MCH 32.0  --  31.6 31.9  MCHC 32.3  --  33.1 33.5  RDW 12.6  --  12.7 12.6  PLT 382  --  488* 455*   Thyroid   Recent Labs  Lab 06/15/24 0640  TSH 0.727    BNP Recent Labs  Lab 06/14/24 0912  BNP 196.2*    DDimer No results for input(s): DDIMER in the last 168 hours.   Radiology  ECHOCARDIOGRAM COMPLETE Result Date: 06/15/2024    ECHOCARDIOGRAM REPORT   Patient Name:   [name] Date of Exam: 06/15/2024 Medical Rec #:  [id number]       Height:       67.0 in Accession #:    [id number]      Weight:       148.0 lb Date of Birth:  04/09/1946        BSA:          1.779 m Patient Age:    78 years        BP:           113/69 mmHg Patient Gender: M               HR:           85 bpm. Exam Location:  Inpatient Procedure: 2D Echo, Cardiac Doppler and Color Doppler (Both Spectral and Color            Flow Doppler were utilized during procedure). Indications:    Chest Pain R07.9, Abnormal EKG R94.31  History:        Patient has prior history of Echocardiogram examinations, most                 recent 02/20/2023. COPD, Arrythmias:PVC and Atrial Fibrillation,                 Signs/Symptoms:Chest Pain and Edema; Risk Factors:Hypertension                 and Former Smoker.  Sonographer:    BERNARDA ROCKS Referring Phys: [id number] CALLIE E GOODRICH IMPRESSIONS  1. Left ventricular ejection fraction, by  estimation, is 60 to 65%. The left ventricle has normal function. Left ventricular endocardial border not optimally defined to evaluate regional wall motion. Indeterminate diastolic filling due to E-A fusion.  2. Right ventricular systolic function is normal. The right ventricular size is mildly enlarged.  3. The mitral valve is grossly normal. Trivial mitral valve regurgitation. No evidence of mitral stenosis.  4. The aortic valve was not well visualized. Aortic valve regurgitation is not visualized. No aortic stenosis is present.  5. The inferior vena cava is normal in size with greater than 50% respiratory variability, suggesting right atrial pressure of 3 mmHg. Comparison(s): No significant change from prior study. FINDINGS  Left Ventricle: Left ventricular ejection fraction, by estimation, is 60 to 65%. The left ventricle has normal function. Left ventricular endocardial border not optimally defined to evaluate regional wall motion. The left ventricular internal cavity size was normal in size. There is no left ventricular hypertrophy. Indeterminate diastolic filling due to E-A fusion. Right Ventricle: The right ventricular size is mildly enlarged. No increase in right ventricular wall thickness. Right ventricular systolic function is normal. Left Atrium: Left atrial size was normal in size. Right Atrium: Right atrial size was normal in size. Pericardium: There is no evidence of pericardial effusion. Mitral Valve: The mitral valve is grossly normal. Trivial mitral valve regurgitation. No evidence of mitral valve stenosis. MV peak gradient, 4.2 mmHg. The mean mitral valve gradient is 2.0 mmHg. Tricuspid Valve: The tricuspid valve is not well visualized. Tricuspid valve regurgitation is  trivial. No evidence of tricuspid stenosis. Aortic Valve: The aortic valve was not well visualized. Aortic valve regurgitation is not visualized. No aortic stenosis is present. Aortic valve mean gradient measures 2.0 mmHg. Aortic  valve peak gradient measures 5.6 mmHg. Aortic valve area, by VTI measures 4.69 cm. Pulmonic Valve: The pulmonic valve was not well visualized. Pulmonic valve regurgitation is not visualized. No evidence of pulmonic stenosis. Aorta: The aortic root and ascending aorta are structurally normal, with no evidence of dilitation. Venous: The inferior vena cava is normal in size with greater than 50% respiratory variability, suggesting right atrial pressure of 3 mmHg. IAS/Shunts: The atrial septum is grossly normal.  LEFT VENTRICLE PLAX 2D LVIDd:         4.60 cm LVIDs:         3.50 cm LV PW:         0.90 cm LV IVS:        1.00 cm LVOT diam:     2.20 cm LV SV:         72 LV SV Index:   41 LVOT Area:     3.80 cm  LV Volumes (MOD) LV vol d, MOD A2C: 117.0 ml LV vol d, MOD A4C: 110.0 ml LV vol s, MOD A2C: 40.5 ml LV vol s, MOD A4C: 39.1 ml LV SV MOD A2C:     76.5 ml LV SV MOD A4C:     110.0 ml LV SV MOD BP:      75.3 ml RIGHT VENTRICLE             IVC RV Basal diam:  4.20 cm     IVC diam: 1.10 cm RV S prime:     11.70 cm/s TAPSE (M-mode): 2.1 cm LEFT ATRIUM             Index        RIGHT ATRIUM           Index LA diam:        3.60 cm 2.02 cm/m   RA Area:     16.20 cm LA Vol (A2C):   20.5 ml 11.52 ml/m  RA Volume:   40.40 ml  22.71 ml/m LA Vol (A4C):   49.7 ml 27.93 ml/m LA Biplane Vol: 33.2 ml 18.66 ml/m  AORTIC VALVE                    PULMONIC VALVE AV Area (Vmax):    3.38 cm     PV Vmax:       0.88 m/s AV Area (Vmean):   3.36 cm     PV Peak grad:  3.1 mmHg AV Area (VTI):     4.69 cm AV Vmax:           118.00 cm/s AV Vmean:          61.800 cm/s AV VTI:            0.154 m AV Peak Grad:      5.6 mmHg AV Mean Grad:      2.0 mmHg LVOT Vmax:         105.00 cm/s LVOT Vmean:        54.700 cm/s LVOT VTI:          0.190 m LVOT/AV VTI ratio: 1.23  AORTA Ao Root diam: 3.30 cm Ao Asc diam:  2.60 cm MITRAL VALVE MV Area (PHT): 2.56 cm     SHUNTS MV Area VTI:   2.25 cm  Systemic VTI:  0.19 m MV Peak grad:  4.2 mmHg      Systemic Diam: 2.20 cm MV Mean grad:  2.0 mmHg MV Vmax:       1.03 m/s MV Vmean:      61.0 cm/s MV Decel Time: 296 msec MV E velocity: 82.00 cm/s MV A velocity: 114.00 cm/s MV E/A ratio:  0.72 Darryle Decent MD Electronically signed by Darryle Decent MD Signature Date/Time: 06/15/2024/1:09:30 PM    Final    VAS US  LOWER EXTREMITY VENOUS (DVT) (7a-7p) Result Date: 06/14/2024  Lower Venous DVT Study Patient Name:  [name]  Date of Exam:   06/14/2024 Medical Rec #: [id number]        Accession #:    [id number] Date of Birth: [birth year]         Patient Gender: M Patient Age:   54 years Exam Location:  [facility] Procedure:      VAS US  LOWER EXTREMITY VENOUS (DVT) Referring Phys: ELSIE BODY --------------------------------------------------------------------------------  Indications: RLE pain and swelling. Other Indications: RT TKA 06/04/24. Comparison Study: No previous exams Performing Technologist: Jody Hill RVT, RDMS  Examination Guidelines: A complete evaluation includes B-mode imaging, spectral Doppler, color Doppler, and power Doppler as needed of all accessible portions of each vessel. Bilateral testing is considered an integral part of a complete examination. Limited examinations for reoccurring indications may be performed as noted. The reflux portion of the exam is performed with the patient in reverse Trendelenburg.  +---------+---------------+---------+-----------+----------+--------------+ RIGHT    CompressibilityPhasicitySpontaneityPropertiesThrombus Aging +---------+---------------+---------+-----------+----------+--------------+ CFV      Full           Yes      Yes                                 +---------+---------------+---------+-----------+----------+--------------+ SFJ      Full                                                        +---------+---------------+---------+-----------+----------+--------------+ FV Prox  Full           Yes      Yes                                  +---------+---------------+---------+-----------+----------+--------------+ FV Mid   Full           Yes      Yes                                 +---------+---------------+---------+-----------+----------+--------------+ FV DistalFull           Yes      Yes                                 +---------+---------------+---------+-----------+----------+--------------+ PFV      Full                                                        +---------+---------------+---------+-----------+----------+--------------+  POP      Full           Yes      Yes                                 +---------+---------------+---------+-----------+----------+--------------+ PTV      Full                                                        +---------+---------------+---------+-----------+----------+--------------+ PERO     Full                                                        +---------+---------------+---------+-----------+----------+--------------+   +--------+---------------+---------+-----------+----------+--------------------+ LEFT    CompressibilityPhasicitySpontaneityPropertiesThrombus Aging       +--------+---------------+---------+-----------+----------+--------------------+ CFV     None           No       No                   Acute - distal                                                            portion              +--------+---------------+---------+-----------+----------+--------------------+ SFJ     Partial                                      Acute                +--------+---------------+---------+-----------+----------+--------------------+ FV Prox Partial        No       No                   Acute                +--------+---------------+---------+-----------+----------+--------------------+ FV Mid  None           No       No                   Acute                 +--------+---------------+---------+-----------+----------+--------------------+ FV      None           No       No                   Acute - one of       Distal                                               paired               +--------+---------------+---------+-----------+----------+--------------------+  PFV     Partial        No       No                   Acute                +--------+---------------+---------+-----------+----------+--------------------+ POP     None           No       No                   Acute                +--------+---------------+---------+-----------+----------+--------------------+ PTV     Full                                                              +--------+---------------+---------+-----------+----------+--------------------+ PERO    Full                                                              +--------+---------------+---------+-----------+----------+--------------------+     Summary: BILATERAL: -No evidence of popliteal cyst, bilaterally. RIGHT: - There is no evidence of deep vein thrombosis in the lower extremity.  Subcutaneous edema extending from knee to ankle.  LEFT: - Findings consistent with acute deep vein thrombosis involving the left common femoral vein, SF junction, left femoral vein, left proximal profunda vein, and left popliteal vein.   *See table(s) above for measurements and observations. Electronically signed by Lonni Gaskins MD on 06/14/2024 at 12:54:16 PM.    Final     Cardiac Studies   Patient Profile   78 y.o. 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  Initially appeared in sinus rhythm with frequent PACs and runs of SVT, but then went into A-fib in  ED. On review of telemetry, appears he is going in and out of atrial fibrillation, otherwise in sinus with frequent PACs. Echo with EF 60-65%, normal RV function.  Currently in Afib, but rate controlled - Continue Eliquis, on 10 mg twice daily x 1 week then decrease to 5 mg twice daily given his acute DVT - Continue diltiazem, will consolidate to 240 mg daily.  Rates well controlled - Plan cardiac monitor on discharge to quantify Afib burden - Will schedule f/u in Afib clinic  Acute DVT: In setting of recent knee replacement.  No PE on CTPA.  Continue Eliquis   Hypertension: Continue diltiazem  [facility] will sign off.   Medication Recommendations:  Eliquis, diltiazem 240 mg daily Other recommendations (labs, testing, etc):  Zio patch x 1 week Follow up as an outpatient:  Will schedule in Afib clinic in 2 weeks   For questions or updates, please contact [facility] Please consult [email or web address] for contact info under    Signed, Lonni LITTIE Nanas, MD  06/16/2024,  11:06 AM

## 2024-06-16 NOTE — Progress Notes (Signed)
 Physical Therapy Treatment Patient Details Name: [name] MRN: [id number] DOB: 11/22/1945 Today's Date: 06/16/2024   History of Present Illness Pt is a 78 y.o. male who presented to [facility] ED 06/14/24 with abdominal pain, right groin pain, and swelling in leg. CTA chest PE neg for acute PT. BLE US  showed findings consistent with acute deep vein thrombosis involving the left common femoral vein, SF junction, left femoral vein, left proximal profunda vein, and left popliteal vein. Work-up revealed new onset A-Fib. PMHx: DDD (cervical and lumbar spine), HTN, frequent PVCs, COPD, lung abscess in 1996 s/p right middle and lower lobe resection, GERD, OA s/p R TKA (06/04/24).    PT Comments  Pt tolerated treatment well today. Pt today was able to ambulate in hallway with RW CGA and navigate stairs with CGA. No change in DC/DME recs at this time. Pt anticipates DC home today.    If plan is discharge home, recommend the following: A little help with walking and/or transfers;A little help with bathing/dressing/bathroom;Assistance with cooking/housework;Assist for transportation;Help with stairs or ramp for entrance   Can travel by private vehicle        Equipment Recommendations  None recommended by PT    Recommendations for Other Services       Precautions / Restrictions Precautions Precautions: Knee;Fall Precaution Booklet Issued: No Recall of Precautions/Restrictions: Impaired Restrictions Weight Bearing Restrictions Per Provider Order: No     Mobility  Bed Mobility Overal bed mobility: Modified Independent Bed Mobility: Supine to Sit, Sit to Supine     Supine to sit: Modified independent (Device/Increase time) Sit to supine: Modified independent (Device/Increase time)        Transfers Overall transfer level: Needs assistance Equipment used: Rolling walker (2 wheels) Transfers: Sit to/from Stand Sit to Stand: Contact guard assist           General transfer comment: Cues  for hand placement    Ambulation/Gait Ambulation/Gait assistance: Contact guard assist Gait Distance (Feet): 250 Feet Assistive device: Rolling walker (2 wheels) Gait Pattern/deviations: Step-to pattern, Decreased step length - right, Decreased step length - left, Decreased stance time - right, Decreased weight shift to right, Antalgic Gait velocity: decreased     General Gait Details: Pt ambulated with slow cautious steps. He maintained RLE in knee ext and circumducted to advance leg. Cues for increased hip/knee flex and heel strike. Pt maintained body inside RW and navigated room/hallway well, no LOB. 2 standing rest breaks   Stairs Stairs: Yes Stairs assistance: Contact guard assist, Min assist Stair Management: Two rails, Step to pattern, Forwards Number of Stairs: 3 General stair comments: Cues for sequencing. Step to pattern. No LOB noted.   Wheelchair Mobility     Tilt Bed    Modified Rankin (Stroke Patients Only)       Balance Overall balance assessment: Needs assistance Sitting-balance support: No upper extremity supported, Feet supported Sitting balance-Leahy Scale: Fair     Standing balance support: Bilateral upper extremity supported, During functional activity, Reliant on assistive device for balance Standing balance-Leahy Scale: Poor                              Communication Communication Communication: No apparent difficulties  Cognition Arousal: Alert Behavior During Therapy: WFL for tasks assessed/performed   PT - Cognitive impairments: No apparent impairments  Following commands: Intact      Cueing Cueing Techniques: Verbal cues  Exercises      General Comments General comments (skin integrity, edema, etc.): VSS      Pertinent Vitals/Pain Pain Assessment Pain Assessment: No/denies pain    Home Living                          Prior Function            PT Goals (current  goals can now be found in the care plan section) Progress towards PT goals: Progressing toward goals    Frequency    Min 2X/week      PT Plan      Co-evaluation              AM-PAC PT 6 Clicks Mobility   Outcome Measure  Help needed turning from your back to your side while in a flat bed without using bedrails?: A Little Help needed moving from lying on your back to sitting on the side of a flat bed without using bedrails?: A Little Help needed moving to and from a bed to a chair (including a wheelchair)?: A Little Help needed standing up from a chair using your arms (e.g., wheelchair or bedside chair)?: A Little Help needed to walk in hospital room?: A Little Help needed climbing 3-5 steps with a railing? : A Little 6 Click Score: 18    End of Session Equipment Utilized During Treatment: Gait belt Activity Tolerance: Patient tolerated treatment well Patient left: in bed;with call bell/phone within reach;with family/visitor present Nurse Communication: Mobility status PT Visit Diagnosis: Difficulty in walking, not elsewhere classified (R26.2);Other abnormalities of gait and mobility (R26.89);Pain;Unsteadiness on feet (R26.81) Pain - Right/Left: Right Pain - part of body: Knee     Time: 8844-8785 PT Time Calculation (min) (ACUTE ONLY): 19 min  Charges:    $Gait Training: 8-22 mins PT General Charges $$ ACUTE PT VISIT: 1 Visit                     [name], PT, DPT Acute Rehab Services [phone]    [name] 06/16/2024, 2:25 PM

## 2024-06-16 NOTE — Discharge Summary (Signed)
 Physician Discharge Summary  [name] FMW:[id number] DOB: 07-19-1946 DOA: 06/14/2024  PCP: Domenica Harlene LABOR, MD  Admit date: 06/14/2024 Discharge date: 06/16/2024  Admitted From: (Home) Disposition:  (Home )  Recommendations for Outpatient Follow-up:  Follow up with PCP in 1-2 weeks Please obtain BMP/CBC in one week Cardiology to arrange for heart monitor as an outpatient and follow-up in A-fib clinic   Home Health: (YES)  Diet recommendation: Heart Healthy  Brief/Interim Summary: 78 year old male with COPD, HTN, OA, recent right total knee replacement on 10/3 comes into the hospital with abdominal pain.  He reports abdominal pain on the right side, mainly when he moves his right leg.  In the ER he was found to have A-fib with RVR, and lower extremity Dopplers due to right lower extremity swelling actually showed left lower extremity acute DVT.  He was placed on heparin, cardiology was consulted for his A-fib and he was admitted to the hospitalist service    A-fib with RVR  -cardiology consulted, he is on full dose anticoagulation, but rate is controlled on Cardizem, consolidated to Cardizem CD to 40 mg oral daily, and they will arrange for A-fib clinic follow-up and cardiac monitor.   Acute left lower extremity DVT  -ultrasound showed acute DVT in the left common femoral vein, SF junction, left femoral vein, left proximal profunda vein and left popliteal vein.  Initially on heparin gtt., currently transition to Eliquis starter pack.  Abdominal pain-suspect muscle straining in the light of recent procedure.  CT of the abdomen and pelvis is without acute findings.  Continue supportive care and pain control -This has resolved, he denies any abdominal pain today   Essential hypertension-continue diltiazem, amlodipine  has been stopped on discharge   Recent total right knee replacement-outpatient follow-up, home health resumed on discharge  Discharge Diagnoses:  Principal  Problem:   DVT (deep venous thrombosis) (HCC) Active Problems:   Frequent PVCs   Atrial fibrillation with RVR [facility])    Discharge Instructions  Discharge Instructions     Diet - low sodium heart healthy   Complete by: As directed    No wound care   Complete by: As directed       Allergies as of 06/16/2024       Reactions   Aspirin  Other (See Comments)   Stomach bleeding    Meloxicam  Other (See Comments)   Caused Stomach Bleeds    Nsaids Other (See Comments)   Do not give to patient due to hx of stomach bleeds    Losartan  Other (See Comments)   Weakness        Medication List     STOP taking these medications    amLODipine  5 MG tablet Commonly known as: NORVASC    apixaban 2.5 MG Tabs tablet Commonly known as: ELIQUIS Replaced by: Apixaban Starter Pack (10mg  and 5mg )       TAKE these medications    acetaminophen  500 MG tablet Commonly known as: TYLENOL  Take 500-1,000 mg by mouth See admin instructions. Take 1,000 mg by mouth in the morning and 500-1,000 mg at bedtime   albuterol  108 (90 Base) MCG/ACT inhaler Commonly known as: VENTOLIN  HFA INHALE 2 PUFFS INTO THE LUNGS EVERY 6 HOURS AS NEEDED FOR WHEEZING OR SHORTNESS OF BREATH. What changed:  when to take this additional instructions   Apixaban Starter Pack (10mg  and 5mg ) Commonly known as: ELIQUIS STARTER PACK Take as directed on package: start with two-5mg  tablets twice daily for 7 days. On day 8, switch  to one-5mg  tablet twice daily. Replaces: apixaban 2.5 MG Tabs tablet   benzonatate  100 MG capsule Commonly known as: TESSALON  TAKE 1 CAPSULE BY MOUTH TWICE DAILY AS NEEDED FOR COUGH   CALCIUM CARBONATE ANTACID PO Take 1,000 mg by mouth in the morning.   cetirizine 10 MG tablet Commonly known as: ZYRTEC Take 10 mg by mouth in the morning.   clobetasol 0.05 % external solution Commonly known as: TEMOVATE Apply 1 Application topically See admin instructions. Apply to the affected areas 2  times a day as needed for irritation   diltiazem 240 MG 24 hr capsule Commonly known as: CARDIZEM CD Take 1 capsule (240 mg total) by mouth daily.   fluticasone  50 MCG/ACT nasal spray Commonly known as: FLONASE  USE 2 SPRAYS IN EACH NOSTRIL EVERY DAY What changed: when to take this   GLUCOSAMINE-CHONDROITIN PO Take 1 tablet by mouth 2 (two) times daily.   guaiFENesin  600 MG 12 hr tablet Commonly known as: MUCINEX  Take 600 mg by mouth 2 (two) times daily.   ipratropium 0.06 % nasal spray Commonly known as: ATROVENT  Place 1 spray into both nostrils 4 (four) times daily. What changed:  when to take this reasons to take this   ketoconazole 2 % shampoo Commonly known as: NIZORAL Apply 1 Application topically See admin instructions. Shampoo 2 times a week   loratadine 10 MG tablet Commonly known as: CLARITIN Take 10 mg by mouth every evening.   methocarbamol 500 MG tablet Commonly known as: ROBAXIN Take 1 tablet (500 mg total) by mouth every 6 (six) hours as needed for muscle spasms.   montelukast  10 MG tablet Commonly known as: SINGULAIR  TAKE 1 TABLET EVERY DAY   multivitamin with minerals Tabs tablet Take 1 tablet by mouth daily with breakfast.   oxyCODONE  5 MG immediate release tablet Commonly known as: Oxy IR/ROXICODONE  Take 1-2 tablets (5-10 mg total) by mouth every 6 (six) hours as needed for moderate pain (pain score 4-6) (pain score 4-6). No more than 6 tablets daily.   pantoprazole  20 MG tablet Commonly known as: PROTONIX  Take 1 tablet by mouth once daily What changed: when to take this   Trelegy Ellipta  100-62.5-25 MCG/ACT Aepb Generic drug: Fluticasone -Umeclidin-Vilant INHALE 1 PUFF INTO THE LUNGS DAILY   triamcinolone  cream 0.1 % Commonly known as: KENALOG  Apply 1 Application topically 2 (two) times daily as needed (for itching).        Follow-up Information     [facility] Follow up.   Specialty: Home Health  Services Contact information: [address] [place] [address] [place] [address] [phone]                Allergies  Allergen Reactions   Aspirin  Other (See Comments)    Stomach bleeding    Meloxicam  Other (See Comments)    Caused Stomach Bleeds    Nsaids Other (See Comments)    Do not give to patient due to hx of stomach bleeds    Losartan  Other (See Comments)    Weakness     Consultations: cardiology   Procedures/Studies: ECHOCARDIOGRAM COMPLETE Result Date: 06/15/2024    ECHOCARDIOGRAM REPORT   Patient Name:   TRELYN VANDERLINDE Date of Exam: 06/15/2024 Medical Rec #:  [id number]       Height:       67.0 in Accession #:    [id number]      Weight:       148.0 lb Date of Birth:  11-13-1945        BSA:          1.779 m Patient Age:    78 years        BP:           113/69 mmHg Patient Gender: M               HR:           85 bpm. Exam Location:  Inpatient Procedure: 2D Echo, Cardiac Doppler and Color Doppler (Both Spectral and Color            Flow Doppler were utilized during procedure). Indications:    Chest Pain R07.9, Abnormal EKG R94.31  History:        Patient has prior history of Echocardiogram examinations, most                 recent 02/20/2023. COPD, Arrythmias:PVC and Atrial Fibrillation,                 Signs/Symptoms:Chest Pain and Edema; Risk Factors:Hypertension                 and Former Smoker.  Sonographer:    BERNARDA ROCKS Referring Phys: [id number] CALLIE E GOODRICH IMPRESSIONS  1. Left ventricular ejection fraction, by estimation, is 60 to 65%. The left ventricle has normal function. Left ventricular endocardial border not optimally defined to evaluate regional wall motion. Indeterminate diastolic filling due to E-A fusion.  2. Right ventricular systolic function is normal. The right ventricular size is mildly enlarged.  3. The mitral valve is grossly normal. Trivial mitral valve regurgitation. No evidence of mitral stenosis.  4. The aortic valve was not well visualized.  Aortic valve regurgitation is not visualized. No aortic stenosis is present.  5. The inferior vena cava is normal in size with greater than 50% respiratory variability, suggesting right atrial pressure of 3 mmHg. Comparison(s): No significant change from prior study. FINDINGS  Left Ventricle: Left ventricular ejection fraction, by estimation, is 60 to 65%. The left ventricle has normal function. Left ventricular endocardial border not optimally defined to evaluate regional wall motion. The left ventricular internal cavity size was normal in size. There is no left ventricular hypertrophy. Indeterminate diastolic filling due to E-A fusion. Right Ventricle: The right ventricular size is mildly enlarged. No increase in right ventricular wall thickness. Right ventricular systolic function is normal. Left Atrium: Left atrial size was normal in size. Right Atrium: Right atrial size was normal in size. Pericardium: There is no evidence of pericardial effusion. Mitral Valve: The mitral valve is grossly normal. Trivial mitral valve regurgitation. No evidence of mitral valve stenosis. MV peak gradient, 4.2 mmHg. The mean mitral valve gradient is 2.0 mmHg. Tricuspid Valve: The tricuspid valve is not well visualized. Tricuspid valve regurgitation is trivial. No evidence of tricuspid stenosis. Aortic Valve: The aortic valve was not well visualized. Aortic valve regurgitation is not visualized. No aortic stenosis is present. Aortic valve mean gradient measures 2.0 mmHg. Aortic valve peak gradient measures 5.6 mmHg. Aortic valve area, by VTI measures 4.69 cm. Pulmonic Valve: The pulmonic valve was not well visualized. Pulmonic valve regurgitation is not visualized. No evidence of pulmonic stenosis. Aorta: The aortic root and ascending aorta are structurally normal, with no evidence of dilitation. Venous: The inferior vena cava is normal in size with greater than 50% respiratory variability, suggesting right atrial pressure of 3  mmHg. IAS/Shunts: The atrial septum is grossly normal.  LEFT VENTRICLE PLAX 2D  LVIDd:         4.60 cm LVIDs:         3.50 cm LV PW:         0.90 cm LV IVS:        1.00 cm LVOT diam:     2.20 cm LV SV:         72 LV SV Index:   41 LVOT Area:     3.80 cm  LV Volumes (MOD) LV vol d, MOD A2C: 117.0 ml LV vol d, MOD A4C: 110.0 ml LV vol s, MOD A2C: 40.5 ml LV vol s, MOD A4C: 39.1 ml LV SV MOD A2C:     76.5 ml LV SV MOD A4C:     110.0 ml LV SV MOD BP:      75.3 ml RIGHT VENTRICLE             IVC RV Basal diam:  4.20 cm     IVC diam: 1.10 cm RV S prime:     11.70 cm/s TAPSE (M-mode): 2.1 cm LEFT ATRIUM             Index        RIGHT ATRIUM           Index LA diam:        3.60 cm 2.02 cm/m   RA Area:     16.20 cm LA Vol (A2C):   20.5 ml 11.52 ml/m  RA Volume:   40.40 ml  22.71 ml/m LA Vol (A4C):   49.7 ml 27.93 ml/m LA Biplane Vol: 33.2 ml 18.66 ml/m  AORTIC VALVE                    PULMONIC VALVE AV Area (Vmax):    3.38 cm     PV Vmax:       0.88 m/s AV Area (Vmean):   3.36 cm     PV Peak grad:  3.1 mmHg AV Area (VTI):     4.69 cm AV Vmax:           118.00 cm/s AV Vmean:          61.800 cm/s AV VTI:            0.154 m AV Peak Grad:      5.6 mmHg AV Mean Grad:      2.0 mmHg LVOT Vmax:         105.00 cm/s LVOT Vmean:        54.700 cm/s LVOT VTI:          0.190 m LVOT/AV VTI ratio: 1.23  AORTA Ao Root diam: 3.30 cm Ao Asc diam:  2.60 cm MITRAL VALVE MV Area (PHT): 2.56 cm     SHUNTS MV Area VTI:   2.25 cm     Systemic VTI:  0.19 m MV Peak grad:  4.2 mmHg     Systemic Diam: 2.20 cm MV Mean grad:  2.0 mmHg MV Vmax:       1.03 m/s MV Vmean:      61.0 cm/s MV Decel Time: 296 msec MV E velocity: 82.00 cm/s MV A velocity: 114.00 cm/s MV E/A ratio:  0.72 Darryle Decent MD Electronically signed by Darryle Decent MD Signature Date/Time: 06/15/2024/1:09:30 PM    Final    VAS US  LOWER EXTREMITY VENOUS (DVT) (7a-7p) Result Date: 06/14/2024  Lower Venous DVT Study Patient Name:  MAREK NGHIEM  Date of Exam:   06/14/2024  Medical  Rec #: [id number]        Accession #:    [id number] Date of Birth: 1946/08/19         Patient Gender: M Patient Age:   37 years Exam Location:  [facility] Procedure:      VAS US  LOWER EXTREMITY VENOUS (DVT) Referring Phys: ELSIE BODY --------------------------------------------------------------------------------  Indications: RLE pain and swelling. Other Indications: RT TKA 06/04/24. Comparison Study: No previous exams Performing Technologist: Jody Hill RVT, RDMS  Examination Guidelines: A complete evaluation includes B-mode imaging, spectral Doppler, color Doppler, and power Doppler as needed of all accessible portions of each vessel. Bilateral testing is considered an integral part of a complete examination. Limited examinations for reoccurring indications may be performed as noted. The reflux portion of the exam is performed with the patient in reverse Trendelenburg.  +---------+---------------+---------+-----------+----------+--------------+ RIGHT    CompressibilityPhasicitySpontaneityPropertiesThrombus Aging +---------+---------------+---------+-----------+----------+--------------+ CFV      Full           Yes      Yes                                 +---------+---------------+---------+-----------+----------+--------------+ SFJ      Full                                                        +---------+---------------+---------+-----------+----------+--------------+ FV Prox  Full           Yes      Yes                                 +---------+---------------+---------+-----------+----------+--------------+ FV Mid   Full           Yes      Yes                                 +---------+---------------+---------+-----------+----------+--------------+ FV DistalFull           Yes      Yes                                 +---------+---------------+---------+-----------+----------+--------------+ PFV      Full                                                         +---------+---------------+---------+-----------+----------+--------------+ POP      Full           Yes      Yes                                 +---------+---------------+---------+-----------+----------+--------------+ PTV      Full                                                        +---------+---------------+---------+-----------+----------+--------------+  PERO     Full                                                        +---------+---------------+---------+-----------+----------+--------------+   +--------+---------------+---------+-----------+----------+--------------------+ LEFT    CompressibilityPhasicitySpontaneityPropertiesThrombus Aging       +--------+---------------+---------+-----------+----------+--------------------+ CFV     None           No       No                   Acute - distal                                                            portion              +--------+---------------+---------+-----------+----------+--------------------+ SFJ     Partial                                      Acute                +--------+---------------+---------+-----------+----------+--------------------+ FV Prox Partial        No       No                   Acute                +--------+---------------+---------+-----------+----------+--------------------+ FV Mid  None           No       No                   Acute                +--------+---------------+---------+-----------+----------+--------------------+ FV      None           No       No                   Acute - one of       Distal                                               paired               +--------+---------------+---------+-----------+----------+--------------------+ PFV     Partial        No       No                   Acute                +--------+---------------+---------+-----------+----------+--------------------+ POP     None            No       No                   Acute                +--------+---------------+---------+-----------+----------+--------------------+ PTV  Full                                                              +--------+---------------+---------+-----------+----------+--------------------+ PERO    Full                                                              +--------+---------------+---------+-----------+----------+--------------------+     Summary: BILATERAL: -No evidence of popliteal cyst, bilaterally. RIGHT: - There is no evidence of deep vein thrombosis in the lower extremity.  Subcutaneous edema extending from knee to ankle.  LEFT: - Findings consistent with acute deep vein thrombosis involving the left common femoral vein, SF junction, left femoral vein, left proximal profunda vein, and left popliteal vein.   *See table(s) above for measurements and observations. Electronically signed by Lonni Gaskins MD on 06/14/2024 at 12:54:16 PM.    Final    CT Angio Chest PE W and/or Wo Contrast Result Date: 06/14/2024 CLINICAL DATA:  Chest pain. Concern for pulmonary embolism. Prior right lobectomy. Right lower quadrant abdominal pain. EXAM: CT ANGIOGRAPHY CHEST CT ABDOMEN AND PELVIS WITH CONTRAST TECHNIQUE: Multidetector CT imaging of the chest was performed using the standard protocol during bolus administration of intravenous contrast. Multiplanar CT image reconstructions and MIPs were obtained to evaluate the vascular anatomy. Multidetector CT imaging of the abdomen and pelvis was performed using the standard protocol during bolus administration of intravenous contrast. RADIATION DOSE REDUCTION: This exam was performed according to the departmental dose-optimization program which includes automated exposure control, adjustment of the mA and/or kV according to patient size and/or use of iterative reconstruction technique. CONTRAST:  75mL OMNIPAQUE  IOHEXOL  350 MG/ML SOLN COMPARISON:   Chest CT dated 11/11/2023. FINDINGS: CTA CHEST FINDINGS Cardiovascular: Mild cardiomegaly. No pericardial effusion. There is moderate calcified and noncalcified plaque of the thoracic aorta. No aneurysmal dilatation. No pulmonary artery embolus identified. Mediastinum/Nodes: No hilar or mediastinal adenopathy. The esophagus is grossly unremarkable. No mediastinal fluid collection. Lungs/Pleura: Postsurgical changes of the right middle and right upper lobectomy. Stable pleural thickening of the right lung base and apex as seen previously. There is background of emphysema. No new consolidation. No pleural effusion or pneumothorax. The central airways remain patent. Musculoskeletal: Bilateral total shoulder arthroplasties. Degenerative changes of the spine. No acute osseous pathology. Review of the MIP images confirms the above findings. CT ABDOMEN and PELVIS FINDINGS No intra-abdominal free air or free fluid. Hepatobiliary: The liver is unremarkable. No acute or dilatation. The gallbladder is unremarkable. Pancreas: Unremarkable. No pancreatic ductal dilatation or surrounding inflammatory changes. Spleen: Normal in size without focal abnormality. Adrenals/Urinary Tract: The adrenal glands are unremarkable. There is no hydronephrosis on either side. There is symmetric enhancement and excretion of contrast by both kidneys. The visualized ureters and urinary bladder appear unremarkable. Air within the urinary bladder likely introduced via recent catheterization. Stomach/Bowel: There is scattered colonic diverticula. There is no bowel obstruction or active inflammation. The appendix is not identified and may be surgically absent. Correlation with history of appendectomy recommended. Vascular/Lymphatic: Advanced aortoiliac atherosclerotic disease. The IVC is unremarkable. No portal venous gas. There is no adenopathy.  Reproductive: Enlarged prostate gland measuring 5.3 cm in transverse axial diameter. The seminal vesicles  are symmetric. Other: None Musculoskeletal: Osteopenia with degenerative changes spine. Lumbar laminectomy and posterior fusion. No acute osseous pathology. Review of the MIP images confirms the above findings. IMPRESSION: 1. No acute intrathoracic, abdominal, or pelvic pathology. No CT evidence of pulmonary artery embolus. 2. Postsurgical changes of the right lung. 3. Colonic diverticulosis. No bowel obstruction. 4.  Aortic Atherosclerosis (ICD10-I70.0). Electronically Signed   By: Vanetta Chou M.D.   On: 06/14/2024 11:06   CT ABDOMEN PELVIS W CONTRAST Result Date: 06/14/2024 CLINICAL DATA:  Chest pain. Concern for pulmonary embolism. Prior right lobectomy. Right lower quadrant abdominal pain. EXAM: CT ANGIOGRAPHY CHEST CT ABDOMEN AND PELVIS WITH CONTRAST TECHNIQUE: Multidetector CT imaging of the chest was performed using the standard protocol during bolus administration of intravenous contrast. Multiplanar CT image reconstructions and MIPs were obtained to evaluate the vascular anatomy. Multidetector CT imaging of the abdomen and pelvis was performed using the standard protocol during bolus administration of intravenous contrast. RADIATION DOSE REDUCTION: This exam was performed according to the departmental dose-optimization program which includes automated exposure control, adjustment of the mA and/or kV according to patient size and/or use of iterative reconstruction technique. CONTRAST:  75mL OMNIPAQUE  IOHEXOL  350 MG/ML SOLN COMPARISON:  Chest CT dated 11/11/2023. FINDINGS: CTA CHEST FINDINGS Cardiovascular: Mild cardiomegaly. No pericardial effusion. There is moderate calcified and noncalcified plaque of the thoracic aorta. No aneurysmal dilatation. No pulmonary artery embolus identified. Mediastinum/Nodes: No hilar or mediastinal adenopathy. The esophagus is grossly unremarkable. No mediastinal fluid collection. Lungs/Pleura: Postsurgical changes of the right middle and right upper lobectomy. Stable  pleural thickening of the right lung base and apex as seen previously. There is background of emphysema. No new consolidation. No pleural effusion or pneumothorax. The central airways remain patent. Musculoskeletal: Bilateral total shoulder arthroplasties. Degenerative changes of the spine. No acute osseous pathology. Review of the MIP images confirms the above findings. CT ABDOMEN and PELVIS FINDINGS No intra-abdominal free air or free fluid. Hepatobiliary: The liver is unremarkable. No acute or dilatation. The gallbladder is unremarkable. Pancreas: Unremarkable. No pancreatic ductal dilatation or surrounding inflammatory changes. Spleen: Normal in size without focal abnormality. Adrenals/Urinary Tract: The adrenal glands are unremarkable. There is no hydronephrosis on either side. There is symmetric enhancement and excretion of contrast by both kidneys. The visualized ureters and urinary bladder appear unremarkable. Air within the urinary bladder likely introduced via recent catheterization. Stomach/Bowel: There is scattered colonic diverticula. There is no bowel obstruction or active inflammation. The appendix is not identified and may be surgically absent. Correlation with history of appendectomy recommended. Vascular/Lymphatic: Advanced aortoiliac atherosclerotic disease. The IVC is unremarkable. No portal venous gas. There is no adenopathy. Reproductive: Enlarged prostate gland measuring 5.3 cm in transverse axial diameter. The seminal vesicles are symmetric. Other: None Musculoskeletal: Osteopenia with degenerative changes spine. Lumbar laminectomy and posterior fusion. No acute osseous pathology. Review of the MIP images confirms the above findings. IMPRESSION: 1. No acute intrathoracic, abdominal, or pelvic pathology. No CT evidence of pulmonary artery embolus. 2. Postsurgical changes of the right lung. 3. Colonic diverticulosis. No bowel obstruction. 4.  Aortic Atherosclerosis (ICD10-I70.0). Electronically  Signed   By: Vanetta Chou M.D.   On: 06/14/2024 11:06   DG Knee Right Port Result Date: 06/04/2024 CLINICAL DATA:  Status post right knee replacement. EXAM: PORTABLE RIGHT KNEE - 1-2 VIEW COMPARISON:  None Available. FINDINGS: Right knee arthroplasty in expected alignment. No periprosthetic lucency or  fracture. There has been patellar resurfacing. Recent postsurgical change includes air and edema in the soft tissues and joint space. Anterior skin staples in place. IMPRESSION: Right knee arthroplasty without immediate postoperative complication. Electronically Signed   By: Andrea Gasman M.D.   On: 06/04/2024 13:56   (Echo, Carotid, EGD, Colonoscopy, ERCP)    Subjective:  Denies any complaints this morning, eager to go home  Discharge Exam: Vitals:   06/16/24 0519 06/16/24 0753  BP:  (!) 146/97  Pulse:  90  Resp:  18  Temp: 98.2 F (36.8 C) 97.8 F (36.6 C)  SpO2:  97%   Vitals:   06/16/24 0013 06/16/24 0200 06/16/24 0519 06/16/24 0753  BP: 125/79   (!) 146/97  Pulse:  82  90  Resp: 20 (!) 24  18  Temp: 97.9 F (36.6 C)  98.2 F (36.8 C) 97.8 F (36.6 C)  TempSrc: Oral  Oral Oral  SpO2:  95%  97%  Weight:      Height:        General: Pt is alert, awake, not in acute distress Cardiovascular: Irregular, S1/S2 +, no rubs, no gallops Respiratory: CTA bilaterally, no wheezing, no rhonchi Abdominal: Soft, NT, ND, bowel sounds + Extremities: Right lower extremity edema postop at baseline    The results of significant diagnostics from this hospitalization (including imaging, microbiology, ancillary and laboratory) are listed below for reference.     Microbiology: No results found for this or any previous visit (from the past 240 hours).   Labs: BNP (last 3 results) Recent Labs    06/14/24 0912  BNP 196.2*   Basic Metabolic Panel: Recent Labs  Lab 06/14/24 0912 06/14/24 1003 06/15/24 0844 06/16/24 0348  NA 138 137 134* 134*  K 4.2 4.1 3.9 3.8  CL 106  103 101 101  CO2 21*  --  20* 22  GLUCOSE 98 100* 132* 119*  BUN 11 13 14 12   CREATININE 0.71 0.70 0.74 0.73  CALCIUM 8.5*  --  8.3* 8.7*  MG 2.0  --   --   --    Liver Function Tests: Recent Labs  Lab 06/14/24 0912  AST 26  ALT 24  ALKPHOS 50  BILITOT 1.3*  PROT 5.9*  ALBUMIN 2.6*   Recent Labs  Lab 06/14/24 0912  LIPASE 19   No results for input(s): AMMONIA in the last 168 hours. CBC: Recent Labs  Lab 06/14/24 0912 06/14/24 1003 06/15/24 0640 06/16/24 0348  WBC 6.5  --  7.8 7.6  NEUTROABS 4.6  --   --   --   HGB 12.1* 12.6* 13.6 12.2*  HCT 37.5* 37.0* 41.1 36.4*  MCV 99.2  --  95.6 95.0  PLT 382  --  488* 455*   Cardiac Enzymes: No results for input(s): CKTOTAL, CKMB, CKMBINDEX, TROPONINI in the last 168 hours. BNP: Invalid input(s): POCBNP CBG: No results for input(s): GLUCAP in the last 168 hours. D-Dimer No results for input(s): DDIMER in the last 72 hours. Hgb A1c No results for input(s): HGBA1C in the last 72 hours. Lipid Profile No results for input(s): CHOL, HDL, LDLCALC, TRIG, CHOLHDL, LDLDIRECT in the last 72 hours. Thyroid  function studies Recent Labs    06/15/24 0640  TSH 0.727   Anemia work up No results for input(s): VITAMINB12, FOLATE, FERRITIN, TIBC, IRON, RETICCTPCT in the last 72 hours. Urinalysis    Component Value Date/Time   COLORURINE YELLOW 06/14/2024 1000   APPEARANCEUR CLEAR 06/14/2024 1000   APPEARANCEUR Clear  05/11/2024 1357   LABSPEC 1.009 06/14/2024 1000   PHURINE 8.0 06/14/2024 1000   GLUCOSEU NEGATIVE 06/14/2024 1000   GLUCOSEU NEGATIVE 07/26/2015 1021   HGBUR NEGATIVE 06/14/2024 1000   BILIRUBINUR NEGATIVE 06/14/2024 1000   BILIRUBINUR Negative 05/11/2024 1357   KETONESUR NEGATIVE 06/14/2024 1000   PROTEINUR NEGATIVE 06/14/2024 1000   UROBILINOGEN 0.2 07/26/2015 1021   NITRITE NEGATIVE 06/14/2024 1000   LEUKOCYTESUR NEGATIVE 06/14/2024 1000   Sepsis Labs Recent  Labs  Lab 06/14/24 0912 06/15/24 0640 06/16/24 0348  WBC 6.5 7.8 7.6   Microbiology No results found for this or any previous visit (from the past 240 hours).   Time coordinating discharge: Over 30 minutes  SIGNED:   Brayton Lye, MD  [facility] 06/16/2024, 11:30 AM Pager   If 7PM-7AM, please contact night-coverage [email or web address] Password [email or web address]

## 2024-06-16 NOTE — Progress Notes (Unsigned)
 Enrolled for [employer] to mail a ZIO XT long term holter monitor to the patients address on file.  ? ?Dr. Bjorn Pippin to read. ?

## 2024-06-16 NOTE — Discharge Instructions (Signed)
 Information on my medicine - ELIQUIS (apixaban)  This medication education was reviewed with me or my healthcare representative as part of my discharge preparation.   Why was Eliquis prescribed for you? Eliquis was prescribed to treat blood clots that may have been found in the veins of your legs (deep vein thrombosis) or in your lungs (pulmonary embolism) and to reduce the risk of them occurring again.  What do You need to know about Eliquis ? The starting dose is 10 mg (two 5 mg tablets) taken TWICE daily for the FIRST SEVEN (7) DAYS, then on 06/22/2024  the dose is reduced to ONE 5 mg tablet taken TWICE daily.  Eliquis may be taken with or without food.   Try to take the dose about the same time in the morning and in the evening. If you have difficulty swallowing the tablet whole please discuss with your pharmacist how to take the medication safely.  Take Eliquis exactly as prescribed and DO NOT stop taking Eliquis without talking to the doctor who prescribed the medication.  Stopping may increase your risk of developing a new blood clot.  Refill your prescription before you run out.  After discharge, you should have regular check-up appointments with your healthcare provider that is prescribing your Eliquis.    What do you do if you miss a dose? If a dose of ELIQUIS is not taken at the scheduled time, take it as soon as possible on the same day and twice-daily administration should be resumed. The dose should not be doubled to make up for a missed dose.  Important Safety Information A possible side effect of Eliquis is bleeding. You should call your healthcare provider right away if you experience any of the following: Bleeding from an injury or your nose that does not stop. Unusual colored urine (red or dark brown) or unusual colored stools (red or black). Unusual bruising for unknown reasons. A serious fall or if you hit your head (even if there is no bleeding).  Some  medicines may interact with Eliquis and might increase your risk of bleeding or clotting while on Eliquis. To help avoid this, consult your healthcare provider or pharmacist prior to using any new prescription or non-prescription medications, including herbals, vitamins, non-steroidal anti-inflammatory drugs (NSAIDs) and supplements.  This website has more information on Eliquis (apixaban): http://[email or web address].com/eliquis/home

## 2024-06-16 NOTE — TOC Transition Note (Signed)
 Transition of Care [facility]) - Discharge Note   Patient Details  Name: [name] MRN: [id number] Date of Birth: 10/27/1945  Transition [facility]) CM/SW Contact:  Marval Gell, RN Phone Number: 06/16/2024, 11:27 AM   Clinical Narrative:     Patient active with [facility] for [facility] services, confirmed w liaison and ROS orders placed for [facility] PT, no other TOC needs identified   Final next level of care: Home w Home Health Services Barriers to Discharge: No Barriers Identified   Patient Goals and CMS Choice Patient states their goals for this hospitalization and ongoing recovery are:: return home          Discharge Placement                       Discharge Plan and Services Additional resources added to the After Visit Summary for                            [facility] Arranged: PT HH Agency: [facility] Date [facility] Agency Contacted: 06/16/24 Time HH Agency Contacted: 1124 Representative spoke with at [facility] Agency: Arna  Social Drivers of Health (SDOH) Interventions SDOH Screenings   Food Insecurity: No Food Insecurity (06/15/2024)  Housing: Low Risk  (06/15/2024)  Transportation Needs: No Transportation Needs (06/15/2024)  Utilities: Not At Risk (06/15/2024)  Alcohol Screen: Low Risk  (05/07/2024)  Depression (PHQ2-9): Low Risk  (05/11/2024)  Financial Resource Strain: Low Risk  (05/07/2024)  Physical Activity: Insufficiently Active (05/07/2024)  Social Connections: Socially Integrated (06/15/2024)  Stress: No Stress Concern Present (05/07/2024)  Tobacco Use: Medium Risk (06/14/2024)  Health Literacy: Adequate Health Literacy (03/16/2024)     Readmission Risk Interventions     No data to display

## 2024-06-17 ENCOUNTER — Ambulatory Visit (INDEPENDENT_AMBULATORY_CARE_PROVIDER_SITE_OTHER): Admitting: Orthopaedic Surgery

## 2024-06-17 ENCOUNTER — Telehealth: Payer: Self-pay

## 2024-06-17 ENCOUNTER — Encounter: Payer: Self-pay | Admitting: Orthopaedic Surgery

## 2024-06-17 DIAGNOSIS — Z96651 Presence of right artificial knee joint: Secondary | ICD-10-CM

## 2024-06-17 MED ORDER — OXYCODONE HCL 5 MG PO TABS: 5.0000 mg | ORAL_TABLET | Freq: Four times a day (QID) | ORAL | 0 refills | Status: DC | PRN

## 2024-06-17 NOTE — Transitions of Care (Post Inpatient/ED Visit) (Signed)
 06/17/2024  Name: [name] MRN: [id number] DOB: [birth year]  Today's TOC FU Call Status: Today's TOC FU Call Status:: Successful TOC FU Call Completed TOC FU Call Complete Date: 06/17/24 Patient's Name and Date of Birth confirmed.  Transition Care Management Follow-up Telephone Call Date of Discharge: 06/16/24 Discharge Facility: [facility]) Type of Discharge: Inpatient Admission Primary Inpatient Discharge Diagnosis:: DVT How have you been since you were released from the hospital?: Better Any questions or concerns?: No  Items Reviewed: Did you receive and understand the discharge instructions provided?: Yes (Needed a reminder to take out the Amlodipine ) Any new allergies since your discharge?: No Dietary orders reviewed?: Yes Type of Diet Ordered:: Low Sodium Heart Healthy Do you have support at home?: Yes People in Home [RPT]: spouse Name of Support/Comfort Primary Source: Dave Mulch  Medications Reviewed Today: Medications Reviewed Today     Reviewed by Moises Reusing, RN (Case Manager) on 06/17/24 at [phone]  Med List Status: <None>   Medication Order Taking? Sig Documenting Provider Last Dose Status Informant  acetaminophen  (TYLENOL ) 500 MG tablet [id number] No Take 500-1,000 mg by mouth See admin instructions. Take 1,000 mg by mouth in the morning and 500-1,000 mg at bedtime [provider] 06/13/2024 Bedtime Active Self  albuterol  (VENTOLIN  HFA) 108 (90 Base) MCG/ACT inhaler [id number] No INHALE 2 PUFFS INTO THE LUNGS EVERY 6 HOURS AS NEEDED FOR WHEEZING OR SHORTNESS OF BREATH.  Patient taking differently: Inhale 2 puffs into the lungs See admin instructions. Inhale 2 puffs into the lungs before   Domenica Harlene LABOR, MD 06/13/2024 Morning Active Self  APIXABAN (ELIQUIS) VTE STARTER PACK (10MG  AND 5MG ) [id number]  Take as directed on package: start with two-5mg  tablets twice daily for 7 days. On day 8, switch to one-5mg  tablet twice daily. Elgergawy, Brayton GORMAN, MD  Active   benzonatate  (TESSALON ) 100 MG capsule [id number] No TAKE 1 CAPSULE BY MOUTH TWICE DAILY AS NEEDED FOR COUGH  Patient not taking: Reported on 06/14/2024   Webb, Padonda B, FNP Not Taking Active Self  CALCIUM CARBONATE ANTACID PO [id number] No Take 1,000 mg by mouth in the morning. [provider] 06/13/2024 Morning Active Self  cetirizine (ZYRTEC) 10 MG tablet [id number] No Take 10 mg by mouth in the morning. [provider] 06/13/2024 Morning Active Self  clobetasol (TEMOVATE) 0.05 % external solution [id number] No Apply 1 Application topically See admin instructions. Apply to the affected areas 2 times a day as needed for irritation [provider] Unknown Active Self  diltiazem (CARDIZEM CD) 240 MG 24 hr capsule [id number]  Take 1 capsule (240 mg total) by mouth daily. Elgergawy, Brayton GORMAN, MD  Active   fluticasone  (FLONASE ) 50 MCG/ACT nasal spray [id number] No USE 2 SPRAYS IN EACH NOSTRIL EVERY DAY  Patient taking differently: Place 2 sprays into both nostrils in the morning.   Domenica Harlene LABOR, MD 06/13/2024 Morning Active Self  GLUCOSAMINE-CHONDROITIN PO [id number] No Take 1 tablet by mouth 2 (two) times daily. [provider] 06/13/2024 Evening Active Self  guaiFENesin  (MUCINEX ) 600 MG 12 hr tablet [id number] No Take 600 mg by mouth 2 (two) times daily. [provider] 06/13/2024 Bedtime Active Self  ipratropium (ATROVENT ) 0.06 % nasal spray [id number] No Place 1 spray into both nostrils 4 (four) times daily.  Patient taking differently: Place 1 spray into both nostrils 4 (four) times daily as needed for rhinitis.   Desai, Nikita S, MD Unknown Active Self  ketoconazole (NIZORAL) 2 % shampoo [id number]  Apply 1 Application topically See admin instructions. Shampoo 2 times a week [provider]  Active Self  loratadine (CLARITIN) 10 MG tablet [id number] No Take 10 mg by mouth every evening. [provider] 06/13/2024 Evening  Active Self  methocarbamol (ROBAXIN) 500 MG tablet [id number] No Take 1 tablet (500 mg total) by mouth every 6 (six) hours as needed for muscle spasms. Vernetta Lonni GRADE, MD Past Week Active Self  montelukast  (SINGULAIR ) 10 MG tablet [id number] No TAKE 1 TABLET EVERY DAY Domenica Harlene LABOR, MD 06/13/2024 Morning Active Self  Multiple Vitamin (MULTIVITAMIN WITH MINERALS) TABS tablet [id number] No Take 1 tablet by mouth daily with breakfast. [provider] 06/13/2024 Morning Active Self  oxyCODONE  (OXY IR/ROXICODONE ) 5 MG immediate release tablet [id number] No Take 1-2 tablets (5-10 mg total) by mouth every 6 (six) hours as needed for moderate pain (pain score 4-6) (pain score 4-6). No more than 6 tablets daily. Vernetta Lonni GRADE, MD 06/13/2024 Evening Active Self  pantoprazole  (PROTONIX ) 20 MG tablet [id number] No Take 1 tablet by mouth once daily  Patient taking differently: Take 20 mg by mouth every evening.   Domenica Harlene LABOR, MD 06/13/2024 Evening Active Self  TRELEGY ELLIPTA  100-62.5-25 MCG/ACT AEPB [id number] No INHALE 1 PUFF INTO THE LUNGS DAILY Domenica Harlene LABOR, MD 06/13/2024 Morning Active Self  triamcinolone  cream (KENALOG ) 0.1 % [id number] No Apply 1 Application topically 2 (two) times daily as needed (for itching). [provider] Unknown Active Self            Home Care and Equipment/Supplies: Were Home Health Services Ordered?: Yes Name of Home Health Agency:: [facility] Has Agency set up a time to come to your home?: Yes First Home Health Visit Date: 06/16/24 Any new equipment or medical supplies ordered?: No  Functional Questionnaire: Do you need assistance with bathing/showering or dressing?: No Do you need assistance with meal preparation?: No Do you need assistance with eating?: No Do you have difficulty maintaining continence: No Do you need assistance with getting out of bed/getting out of a chair/moving?: No Do you have difficulty managing or taking  your medications?: No  Follow up appointments reviewed: PCP Follow-up appointment confirmed?: Yes Date of PCP follow-up appointment?: 07/01/24 Follow-up Provider: Glade Domenica Specialist [facility] Follow-up appointment confirmed?: Yes Date of Specialist follow-up appointment?: 06/17/24 Follow-Up Specialty Provider:: Dr. Vernetta Do you need transportation to your follow-up appointment?: No Do you understand care options if your condition(s) worsen?: Yes-patient verbalized understanding  SDOH Interventions Today    Flowsheet Row Most Recent Value  SDOH Interventions   Food Insecurity Interventions Intervention Not Indicated  Housing Interventions Intervention Not Indicated  Transportation Interventions Intervention Not Indicated  Utilities Interventions Intervention Not Indicated   Medford Balboa, BSN, RN [place]  [facility] - [facility] RN Care Manager [phone]

## 2024-06-17 NOTE — Progress Notes (Signed)
 The patient is here today for his first postoperative visit 2 weeks status post a right total knee arthroplasty to treat significant right knee pain and arthritis.  Postoperatively, he did have shortness of breath and went to the emergency room and was negative for a PE via CT angiogram.  However he did end up having a DVT in his left opposite leg.  He is on Eliquis now.  He is gotten behind as far as home health therapy but he is trying hard on his own.  On exam his incision looks good.  His calf is soft on the right side and left some.  He has swelling in that right knee to be expected.  Staples been removed and Steri-Strips applied.  His extension is almost full and his flexion is to maybe 70 to 75 degrees.  He understands importance of getting his knee bending and moving.  We will refill his oxycodone .  He is going to start outpatient physical therapy hopefully soon to push his knee in terms of range of motion.  Will see him back in a month to see how he is doing from a range of motion standpoint.

## 2024-06-27 NOTE — Assessment & Plan Note (Signed)
 Encourage heart healthy diet such as MIND or DASH diet, increase exercise, avoid trans fats, simple carbohydrates and processed foods, consider a krill or fish or flaxseed oil cap daily.

## 2024-06-27 NOTE — Assessment & Plan Note (Signed)
No recent exacerbation, follows with pulmonology

## 2024-06-27 NOTE — Assessment & Plan Note (Signed)
Rate controlled and tolerating meds

## 2024-06-27 NOTE — Assessment & Plan Note (Signed)
 Well controlled, no changes to meds. Encouraged heart healthy diet such as the DASH diet and exercise as tolerated.

## 2024-06-27 NOTE — Assessment & Plan Note (Signed)
 [id number] acceptable, minimize simple carbs. Increase exercise as tolerated.

## 2024-06-28 DIAGNOSIS — E785 Hyperlipidemia, unspecified: Secondary | ICD-10-CM | POA: Diagnosis not present

## 2024-06-28 DIAGNOSIS — Z471 Aftercare following joint replacement surgery: Secondary | ICD-10-CM | POA: Diagnosis not present

## 2024-06-28 DIAGNOSIS — R911 Solitary pulmonary nodule: Secondary | ICD-10-CM | POA: Diagnosis not present

## 2024-06-28 DIAGNOSIS — D649 Anemia, unspecified: Secondary | ICD-10-CM | POA: Diagnosis not present

## 2024-06-28 DIAGNOSIS — I340 Nonrheumatic mitral (valve) insufficiency: Secondary | ICD-10-CM | POA: Diagnosis not present

## 2024-06-28 DIAGNOSIS — G894 Chronic pain syndrome: Secondary | ICD-10-CM | POA: Diagnosis not present

## 2024-06-28 DIAGNOSIS — M51369 Other intervertebral disc degeneration, lumbar region without mention of lumbar back pain or lower extremity pain: Secondary | ICD-10-CM | POA: Diagnosis not present

## 2024-06-28 DIAGNOSIS — E43 Unspecified severe protein-calorie malnutrition: Secondary | ICD-10-CM | POA: Diagnosis not present

## 2024-06-29 DIAGNOSIS — M25561 Pain in right knee: Secondary | ICD-10-CM | POA: Diagnosis not present

## 2024-06-29 DIAGNOSIS — Z96651 Presence of right artificial knee joint: Secondary | ICD-10-CM | POA: Diagnosis not present

## 2024-06-29 DIAGNOSIS — M1711 Unilateral primary osteoarthritis, right knee: Secondary | ICD-10-CM | POA: Diagnosis not present

## 2024-07-01 ENCOUNTER — Encounter: Payer: Self-pay | Admitting: Family Medicine

## 2024-07-01 ENCOUNTER — Ambulatory Visit: Admitting: Family Medicine

## 2024-07-01 ENCOUNTER — Other Ambulatory Visit (HOSPITAL_BASED_OUTPATIENT_CLINIC_OR_DEPARTMENT_OTHER): Payer: Self-pay

## 2024-07-01 VITALS — BP 142/74 | HR 67 | Temp 98.3°F | Ht 67.0 in | Wt 145.2 lb

## 2024-07-01 DIAGNOSIS — J432 Centrilobular emphysema: Secondary | ICD-10-CM

## 2024-07-01 DIAGNOSIS — M1711 Unilateral primary osteoarthritis, right knee: Secondary | ICD-10-CM | POA: Diagnosis not present

## 2024-07-01 DIAGNOSIS — M25561 Pain in right knee: Secondary | ICD-10-CM | POA: Diagnosis not present

## 2024-07-01 DIAGNOSIS — E785 Hyperlipidemia, unspecified: Secondary | ICD-10-CM

## 2024-07-01 DIAGNOSIS — R739 Hyperglycemia, unspecified: Secondary | ICD-10-CM | POA: Diagnosis not present

## 2024-07-01 DIAGNOSIS — I4891 Unspecified atrial fibrillation: Secondary | ICD-10-CM | POA: Diagnosis not present

## 2024-07-01 DIAGNOSIS — I10 Essential (primary) hypertension: Secondary | ICD-10-CM | POA: Diagnosis not present

## 2024-07-01 DIAGNOSIS — Z96651 Presence of right artificial knee joint: Secondary | ICD-10-CM | POA: Diagnosis not present

## 2024-07-01 LAB — CBC WITH DIFFERENTIAL/PLATELET
Basophils Absolute: 0.1 K/uL (ref 0.0–0.1)
Basophils Relative: 1 % (ref 0.0–3.0)
Eosinophils Absolute: 0.1 K/uL (ref 0.0–0.7)
Eosinophils Relative: 1.2 % (ref 0.0–5.0)
HCT: 40.5 % (ref 39.0–52.0)
Hemoglobin: 13.2 g/dL (ref 13.0–17.0)
Lymphocytes Relative: 19.9 % (ref 12.0–46.0)
Lymphs Abs: 1.2 K/uL (ref 0.7–4.0)
MCHC: 32.6 g/dL (ref 30.0–36.0)
MCV: 95.5 fl (ref 78.0–100.0)
Monocytes Absolute: 0.5 K/uL (ref 0.1–1.0)
Monocytes Relative: 9.1 % (ref 3.0–12.0)
Neutro Abs: 4 K/uL (ref 1.4–7.7)
Neutrophils Relative %: 68.8 % (ref 43.0–77.0)
Platelets: 373 K/uL (ref 150.0–400.0)
RBC: 4.24 Mil/uL (ref 4.22–5.81)
RDW: 13.5 % (ref 11.5–15.5)
WBC: 5.8 K/uL (ref 4.0–10.5)

## 2024-07-01 LAB — COMPREHENSIVE METABOLIC PANEL WITH GFR
ALT: 10 U/L (ref 0–53)
AST: 15 U/L (ref 0–37)
Albumin: 3.9 g/dL (ref 3.5–5.2)
Alkaline Phosphatase: 76 U/L (ref 39–117)
BUN: 18 mg/dL (ref 6–23)
CO2: 28 meq/L (ref 19–32)
Calcium: 9.2 mg/dL (ref 8.4–10.5)
Chloride: 102 meq/L (ref 96–112)
Creatinine, Ser: 0.71 mg/dL (ref 0.40–1.50)
GFR: 88.41 mL/min (ref >60.00)
Glucose, Bld: 87 mg/dL (ref 70–99)
Potassium: 4.3 meq/L (ref 3.5–5.1)
Sodium: 139 meq/L (ref 135–145)
Total Bilirubin: 0.7 mg/dL (ref 0.2–1.2)
Total Protein: 6.8 g/dL (ref 6.0–8.3)

## 2024-07-01 LAB — LIPID PANEL
Cholesterol: 146 mg/dL (ref 0–200)
HDL: 47.6 mg/dL (ref >39.00)
LDL Cholesterol: 69 mg/dL (ref 0–99)
NonHDL: 98.75
Total CHOL/HDL Ratio: 3
Triglycerides: 149 mg/dL (ref 0.0–149.0)
VLDL: 29.8 mg/dL (ref 0.0–40.0)

## 2024-07-01 MED ORDER — AREXVY 120 MCG/0.5ML IM SUSR
0.5000 mL | Freq: Once | INTRAMUSCULAR | 0 refills | Status: AC
  Filled 2024-07-01: qty 0.5, 1d supply, fill #0

## 2024-07-01 MED ORDER — OXYCODONE HCL 5 MG PO TABS: 5.0000 mg | ORAL_TABLET | Freq: Three times a day (TID) | ORAL | 0 refills | Status: AC | PRN

## 2024-07-01 MED ORDER — COMIRNATY 30 MCG/0.3ML IM SUSY
0.3000 mL | PREFILLED_SYRINGE | Freq: Once | INTRAMUSCULAR | 0 refills | Status: AC
  Filled 2024-07-01: qty 0.3, 1d supply, fill #0

## 2024-07-01 NOTE — Patient Instructions (Addendum)
 Tetanus is due Prevnar 20 RSV, Respiratory Syncitial Virus Vaccine, Arexvy COVID annual booster  Total Knee Replacement Surgery: What to Know After After a total knee replacement, it's common to have: Redness, pain, and swelling at the cut from surgery area. This is also called the incision area. Stiffness. Discomfort. A small amount of blood or clear fluid coming from your cut. Follow these instructions at home: Medicines Take your medicines only as told. You may need to take steps to help treat or prevent trouble pooping (constipation), such as: Taking medicines to help you poop. Eating foods high in fiber, like beans, whole grains, and fresh fruits and vegetables. Drinking more fluids as told. Ask your health care provider if it's safe to drive or use machines while taking your medicine. Bathing Do not take baths, swim, or use a hot tub until you're told it's OK. Ask if you can shower. If you bandage isn't waterproof: Do not let it get wet. Cover it when you take a bath or shower. Use a cover that doesn't let any water  in. Incision care Take care of your cut from surgery as told. Make sure you: Wash your hands with soap and water  for at least 20 seconds before and after you change your bandage. If you can't use soap and water , use hand sanitizer. Change your bandage. Leave stitches or skin glue alone. Leave tape strips alone unless you're told to take them off. You may trim the edges of the tape strips if they curl up. Check the area around your cut every day for signs of infection. Check for: More redness, swelling, or pain. More fluid or blood. Warmth. Pus or a bad smell.  Activity Rest as told. Get up to take short walks at least every 2 hours during the day. This helps you breathe better and keeps your blood flowing. Ask for help if you feel weak or unsteady. Follow instructions from your provider about using a walker, crutches, or a cane. You may use your legs to  support your body weight as told by your provider. Follow instructions about how much weight you may safely support on your affected leg. You may be shown how to get out of a bed and chair and how to go up and down stairs. You will first do this with a walker, crutches, or a cane. Once you are able to walk without a limp, you may stop using a walker, crutches, or a cane, as told by your provider. Do exercises as told by your provider to help restore your strength and knee movement. Exercises will start as soon as possible after your surgery. You'll work with a physical therapist for up to a few months after your surgery. Avoid high-impact activities. Avoid running, jumping rope, and doing jumping jacks. Do not play contact sports until your provider approves. Ask what things are safe for you to do at home. Ask when you can go back to work or school. Managing pain, stiffness, and swelling  Use ice or an ice pack as told. Put ice in a plastic bag or use the cold flow pad or cold therapy unit that you were given. Place a towel between your skin and the bag or device. Leave the ice on for 20 minutes, 2-3 times a day. If your skin turns red, take off the ice right away to prevent skin damage. The risk of damage is higher if you can't feel pain, heat, or cold. Move your toes often to reduce stiffness and  swelling. Raise your leg above the level of your heart while you are sitting or lying down. Use several pillows to keep your leg straight. Do not put a pillow just under the knee. If the knee is bent for a long time, this may lead to stiffness. Wear an elastic knee support as told by your provider. Safety To help prevent falls, keep floors clear. Put things that you may need within easy reach. Wear an apron or tool belt with pockets for carrying things. This leaves your hands free to help with your balance. Ask your provider when it is safe to drive. General instructions Wear compression stockings  to reduce swelling and prevent blood clots in your legs. Keep doing breathing exercises as told. This helps prevent lung infection. Do not smoke, vape, or use nicotine or tobacco. Do not have dental work or cleanings for at least 3 months. Ask your provider if you need to take antibiotics before you have dental work or have your teeth cleaned. Tell your dentist about your new joint. Keep all follow-up visits. Your provider will need to check how your knee is healing. Contact a health care provider if: You have a fever or chills. You have a cough or feel short of breath. You have very bad pain and medicine is not helping your pain. You have any signs of infection. You fall. You have increased swelling, redness, and calf pain. Your cut from surgery breaks open after your stitches or staples are taken out. Get help right away if: You have trouble breathing. You have a fast and irregular heartbeat. You have chest pain. These symptoms may be an emergency. Call 911 right away. Do not wait to see if the symptoms will go away. Do not drive yourself to the hospital. This information is not intended to replace advice given to you by your health care provider. Make sure you discuss any questions you have with your health care provider. Document Revised: 06/19/2023 Document Reviewed: 01/23/2023 Elsevier Patient Education  2024 [employer].

## 2024-07-02 ENCOUNTER — Ambulatory Visit: Payer: Self-pay | Admitting: Family Medicine

## 2024-07-02 LAB — VITAMIN D 25 HYDROXY (VIT D DEFICIENCY, FRACTURES): VITD: 42.14 ng/mL (ref 30.00–100.00)

## 2024-07-02 LAB — HEMOGLOBIN A1C: Hgb A1c MFr Bld: 5.6 % (ref 4.6–6.5)

## 2024-07-04 ENCOUNTER — Encounter: Payer: Self-pay | Admitting: Family Medicine

## 2024-07-04 NOTE — Progress Notes (Signed)
 Subjective:    Patient ID: [name], male    DOB: 1945-12-01, 78 y.o.   MRN: [id number]  Chief Complaint  Patient presents with   Follow-up    Onset 07/25 I started having swelling in my feet  Pt is requesting a refill for Oxy     HPI Discussed the use of AI scribe software for clinical note transcription with the patient, who gave verbal consent to proceed.  History of Present Illness [name] is a 78 year old male who presents for follow-up after right knee total arthroplasty.  He underwent a right knee total arthroplasty two weeks ago and is currently engaged in physical therapy to improve range of motion. He feels more steady each day. Pain management has been challenging; Tylenol  is sometimes insufficient, and he has requested an additional prescription for oxycodone . He currently takes oxycodone  twice daily, one at bedtime to ease pain for sleep and one in the morning to manage daytime pain. He also takes Tylenol , two 500 mg tablets twice a day, and occasionally three times a day on bad days. No side effects such as nausea or itching from oxycodone . No issues with constipation postoperatively.  He has a history of atrial fibrillation and is currently wearing a monitor to track episodes. He experienced one episode since wearing the monitor, which lasted 10-15 minutes and occurred while he was in bed. He is currently on Eliquis for atrial fibrillation and a blood clot in his left leg.  He reports some pain in his hip, possibly due to changes in gait post-surgery. He uses topical treatments like Salonpas patches and Voltaren  gel for relief.  He reports a mild postoperative anemia and low calcium levels, with a calcium level of 8.7. He takes a multivitamin and has not had low calcium levels prior to this surgery. He is adequately hydrated and reports getting enough sleep, although he sometimes naps during the day, which affects his nighttime sleep.  He has received  his flu shot recently. He has not had the RSV vaccine.    Past Medical History:  Diagnosis Date   Allergic rhinitis    Atypical chest pain 12/01/2014   Back pain 01/12/2013   COPD (chronic obstructive pulmonary disease) (HCC)    FeV1 64%-2007   Dyspnea    Dysrhythmia    PVCs   Emphysema    GERD (gastroesophageal reflux disease)    H/O total shoulder replacement, left 2024   H/O total shoulder replacement, right 2023   History of lung abscess    bronchiectasis with RMLandRLL ersection -1996- Dr Brantley   Hordeolum externum (stye) 04/23/2015   Right eye   Hypertension    Impaired vision    glasses   Osteoarthritis    hands and knees   Pneumonia    Sinusitis, acute 04/23/2015   Skin cancer    Right eye area    Past Surgical History:  Procedure Laterality Date   COLONOSCOPY  11/20/2010   Dr.Stark   ESOPHAGOGASTRODUODENOSCOPY (EGD) WITH PROPOFOL  N/A 10/31/2021   Procedure: ESOPHAGOGASTRODUODENOSCOPY (EGD) WITH PROPOFOL ;  Surgeon: Avram Lupita BRAVO, MD;  Location: [facility] ENDOSCOPY;  Service: Gastroenterology;  Laterality: N/A;   HEMOSTASIS CONTROL  10/31/2021   Procedure: HEMOSTASIS CONTROL;  Surgeon: Avram Lupita BRAVO, MD;  Location: [facility] ENDOSCOPY;  Service: Gastroenterology;;   HERNIA REPAIR  10/03/2009   HOT HEMOSTASIS N/A 10/31/2021   Procedure: HOT HEMOSTASIS (ARGON PLASMA COAGULATION/BICAP);  Surgeon: Avram Lupita BRAVO, MD;  Location: [facility] ENDOSCOPY;  Service: Gastroenterology;  Laterality: N/A;   left knee     lower back surgery  09/02/2008   06/2009   LUNG SURGERY     RML andRUL removed due to bleeding and bronchiectasis and lung abcess   MOUTH SURGERY     teeth extraction   NECK SURGERY  05/03/2008   REVERSE SHOULDER ARTHROPLASTY Right 08/22/2022   Procedure: RIGHT REVERSE SHOULDER ARTHROPLASTY;  Surgeon: Genelle Standing, MD;  Location: [facility] OR;  Service: Orthopedics;  Laterality: Right;   REVERSE SHOULDER ARTHROPLASTY Left 08/05/2023   Procedure: LEFT REVERSE SHOULDER  ARTHROPLASTY;  Surgeon: Genelle Standing, MD;  Location: [facility];  Service: Orthopedics;  Laterality: Left;   right foot surgery     SCLEROTHERAPY  10/31/2021   Procedure: SCLEROTHERAPY;  Surgeon: Avram Lupita BRAVO, MD;  Location: [facility] ENDOSCOPY;  Service: Gastroenterology;;   TONSILLECTOMY     removed as a child   TOTAL KNEE ARTHROPLASTY Right 06/04/2024   Procedure: ARTHROPLASTY, KNEE, TOTAL;  Surgeon: Vernetta Lonni GRADE, MD;  Location: [facility] ORS;  Service: Orthopedics;  Laterality: Right;   VIDEO BRONCHOSCOPY N/A 07/12/2020   Procedure: VIDEO BRONCHOSCOPY;  Surgeon: Kerrin Standing BROCKS, MD;  Location: [facility] OR;  Service: Thoracic;  Laterality: N/A;   VIDEO BRONCHOSCOPY WITH ENDOBRONCHIAL NAVIGATION N/A 07/12/2020   Procedure: VIDEO BRONCHOSCOPY WITH ENDOBRONCHIAL NAVIGATION;  Surgeon: Kerrin Standing BROCKS, MD;  Location: [facility] OR;  Service: Thoracic;  Laterality: N/A;    Family History  Problem Relation Age of Onset   Heart failure Mother        age 57   COPD Mother    Prostate cancer Father        father died prostate ca   Obstructive Sleep Apnea Brother    Obesity Brother    Colon cancer Paternal Grandmother    Healthy Son    Coronary artery disease Other        1st degree relative<60   Stroke Other        1st degree relative<50   Esophageal cancer Neg Hx    Stomach cancer Neg Hx     Social History   Socioeconomic History   Marital status: Married    Spouse name: [name]   Number of children: Not on file   Years of education: Not on file   Highest education level: Bachelor's degree (e.g., BA, AB, BS)  Occupational History   Occupation: retired Publishing Rights Manager  Tobacco Use   Smoking status: Former    Current packs/day: 0.00    Average packs/day: 1.5 packs/day for 38.0 years (57.0 ttl pk-yrs)    Types: Cigarettes    Start date: 09/03/1962    Quit date: 09/03/2000    Years since quitting: 23.8    Passive exposure: Past   Smokeless tobacco: Never   Vaping Use   Vaping status: Never Used  Substance and Sexual Activity   Alcohol use: Yes    Alcohol/week: 1.0 standard drink of alcohol    Types: 1 Cans of beer per week    Comment: 3 or 4 nights weekly   Drug use: No   Sexual activity: Never  Other Topics Concern   Not on file  Social History Narrative   Retired Information Systems Manager   Patient states former smoker. 1 1/2 ppd x 38 yrs  Quit in Jan 2002   Married - 2 weeks (4th marriage)   divorced,  remarried 84-2004 (lost wife to lung ca),  remarried (divorced),    1 son  -  30 ([place])   Alcohol use-yes (2-3 beers per week)           Social Drivers of Health   Financial Resource Strain: Low Risk  (06/24/2024)   Overall Financial Resource Strain (CARDIA)    Difficulty of Paying Living Expenses: Not hard at all  Food Insecurity: No Food Insecurity (06/24/2024)   Hunger Vital Sign    Worried About Running Out of Food in the Last Year: Never true    Ran Out of Food in the Last Year: Never true  Transportation Needs: No Transportation Needs (06/24/2024)   PRAPARE - Administrator, Civil Service (Medical): No    Lack of Transportation (Non-Medical): No  Physical Activity: Insufficiently Active (06/24/2024)   Exercise Vital Sign    Days of Exercise per Week: 6 days    Minutes of Exercise per Session: 20 min  Stress: No Stress Concern Present (06/24/2024)   [employer] of Occupational Health - Occupational Stress Questionnaire    Feeling of Stress: Not at all  Social Connections: Unknown (06/24/2024)   Social Connection and Isolation Panel    Frequency of Communication with Friends and Family: More than three times a week    Frequency of Social Gatherings with Friends and Family: Once a week    Attends Religious Services: More than 4 times per year    Active Member of [employer] or Organizations: Yes    Attends Engineer, Structural: More than 4 times per year    Marital Status: Not on file  Intimate  Partner Violence: Not At Risk (06/17/2024)   Humiliation, Afraid, Rape, and Kick questionnaire    Fear of Current or Ex-Partner: No    Emotionally Abused: No    Physically Abused: No    Sexually Abused: No    Outpatient Medications Prior to Visit  Medication Sig Dispense Refill   acetaminophen  (TYLENOL ) 500 MG tablet Take 500-1,000 mg by mouth See admin instructions. Take 1,000 mg by mouth in the morning and 500-1,000 mg at bedtime     albuterol  (VENTOLIN  HFA) 108 (90 Base) MCG/ACT inhaler INHALE 2 PUFFS INTO THE LUNGS EVERY 6 HOURS AS NEEDED FOR WHEEZING OR SHORTNESS OF BREATH. (Patient taking differently: Inhale 2 puffs into the lungs See admin instructions. Inhale 2 puffs into the lungs before) 2 each 10   APIXABAN (ELIQUIS) VTE STARTER PACK (10MG  AND 5MG ) Take as directed on package: start with two-5mg  tablets twice daily for 7 days. On day 8, switch to one-5mg  tablet twice daily. 74 each 0   CALCIUM CARBONATE ANTACID PO Take 1,000 mg by mouth in the morning.     cetirizine (ZYRTEC) 10 MG tablet Take 10 mg by mouth in the morning.     clobetasol (TEMOVATE) 0.05 % external solution Apply 1 Application topically See admin instructions. Apply to the affected areas 2 times a day as needed for irritation     diltiazem (CARDIZEM CD) 240 MG 24 hr capsule Take 1 capsule (240 mg total) by mouth daily. 30 capsule 0   fluticasone  (FLONASE ) 50 MCG/ACT nasal spray USE 2 SPRAYS IN EACH NOSTRIL EVERY DAY (Patient taking differently: Place 2 sprays into both nostrils in the morning.) 48 g 3   GLUCOSAMINE-CHONDROITIN PO Take 1 tablet by mouth 2 (two) times daily.     guaiFENesin  (MUCINEX ) 600 MG 12 hr tablet Take 600 mg by mouth 2 (two) times daily.     ipratropium (ATROVENT ) 0.06 % nasal spray Place 1 spray into both  nostrils 4 (four) times daily. (Patient taking differently: Place 1 spray into both nostrils 4 (four) times daily as needed for rhinitis.) 15 mL 12   ketoconazole (NIZORAL) 2 % shampoo Apply 1  Application topically See admin instructions. Shampoo 2 times a week     loratadine (CLARITIN) 10 MG tablet Take 10 mg by mouth every evening.     methocarbamol (ROBAXIN) 500 MG tablet Take 1 tablet (500 mg total) by mouth every 6 (six) hours as needed for muscle spasms. 30 tablet 1   montelukast  (SINGULAIR ) 10 MG tablet TAKE 1 TABLET EVERY DAY 90 tablet 3   Multiple Vitamin (MULTIVITAMIN WITH MINERALS) TABS tablet Take 1 tablet by mouth daily with breakfast.     pantoprazole  (PROTONIX ) 20 MG tablet Take 1 tablet by mouth once daily (Patient taking differently: Take 20 mg by mouth every evening.) 90 tablet 3   TRELEGY ELLIPTA  100-62.5-25 MCG/ACT AEPB INHALE 1 PUFF INTO THE LUNGS DAILY 60 each 11   triamcinolone  cream (KENALOG ) 0.1 % Apply 1 Application topically 2 (two) times daily as needed (for itching).     oxyCODONE  (OXY IR/ROXICODONE ) 5 MG immediate release tablet Take 1-2 tablets (5-10 mg total) by mouth every 6 (six) hours as needed for moderate pain (pain score 4-6) (pain score 4-6). No more than 6 tablets daily. 30 tablet 0   benzonatate  (TESSALON ) 100 MG capsule TAKE 1 CAPSULE BY MOUTH TWICE DAILY AS NEEDED FOR COUGH (Patient not taking: Reported on 07/01/2024) 90 capsule 0   No facility-administered medications prior to visit.    Allergies  Allergen Reactions   Aspirin  Other (See Comments)    Stomach bleeding    Meloxicam  Other (See Comments)    Caused Stomach Bleeds    Nsaids Other (See Comments)    Do not give to patient due to hx of stomach bleeds    Losartan  Other (See Comments)    Weakness     Review of Systems  Constitutional:  Negative for fever and malaise/fatigue.  HENT:  Negative for congestion.   Eyes:  Negative for blurred vision.  Respiratory:  Negative for shortness of breath.   Cardiovascular:  Negative for chest pain, palpitations and leg swelling.  Gastrointestinal:  Negative for abdominal pain, blood in stool and nausea.  Genitourinary:  Negative for  dysuria and frequency.  Musculoskeletal:  Positive for joint pain. Negative for falls.  Skin:  Negative for rash.  Neurological:  Negative for dizziness, loss of consciousness and headaches.  Endo/Heme/Allergies:  Negative for environmental allergies.  Psychiatric/Behavioral:  Negative for depression. The patient is not nervous/anxious.        Objective:    Physical Exam Vitals reviewed.  Constitutional:      Appearance: Normal appearance. He is not ill-appearing.  HENT:     Head: Normocephalic and atraumatic.     Nose: Nose normal.  Eyes:     Conjunctiva/sclera: Conjunctivae normal.  Cardiovascular:     Rate and Rhythm: Normal rate.     Pulses: Normal pulses.     Heart sounds: Normal heart sounds. No murmur heard. Pulmonary:     Effort: Pulmonary effort is normal.     Breath sounds: Normal breath sounds. No wheezing.  Abdominal:     Palpations: Abdomen is soft. There is no mass.     Tenderness: There is no abdominal tenderness.  Musculoskeletal:     Cervical back: Normal range of motion.     Right lower leg: No edema.     Left  lower leg: No edema.  Skin:    General: Skin is warm and dry.  Neurological:     General: No focal deficit present.     Mental Status: He is alert and oriented to person, place, and time.  Psychiatric:        Mood and Affect: Mood normal.     BP (!) 142/74 (BP Location: Left Arm, Patient Position: Sitting, Cuff Size: Normal)   Pulse 67   Temp 98.3 F (36.8 C) (Oral)   Ht 5' 7 (1.702 m)   Wt 145 lb 3.2 oz (65.9 kg)   SpO2 97%   BMI 22.74 kg/m  Wt Readings from Last 3 Encounters:  07/01/24 145 lb 3.2 oz (65.9 kg)  06/14/24 148 lb (67.1 kg)  06/08/24 145 lb (65.8 kg)    Diabetic Foot Exam - Simple   No data filed    Lab Results  Component Value Date   WBC 5.8 07/01/2024   HGB 13.2 07/01/2024   HCT 40.5 07/01/2024   PLT 373.0 07/01/2024   GLUCOSE 87 07/01/2024   CHOL 146 07/01/2024   TRIG 149.0 07/01/2024   HDL 47.60  07/01/2024   LDLDIRECT 85 07/22/2014   LDLCALC 69 07/01/2024   ALT 10 07/01/2024   AST 15 07/01/2024   NA 139 07/01/2024   K 4.3 07/01/2024   CL 102 07/01/2024   CREATININE 0.71 07/01/2024   BUN 18 07/01/2024   CO2 28 07/01/2024   TSH 0.727 06/15/2024   PSA 2.22 08/16/2021   INR 1.0 05/11/2024   HGBA1C 5.6 07/01/2024    Lab Results  Component Value Date   TSH 0.727 06/15/2024   Lab Results  Component Value Date   WBC 5.8 07/01/2024   HGB 13.2 07/01/2024   HCT 40.5 07/01/2024   MCV 95.5 07/01/2024   PLT 373.0 07/01/2024   Lab Results  Component Value Date   NA 139 07/01/2024   K 4.3 07/01/2024   CO2 28 07/01/2024   GLUCOSE 87 07/01/2024   BUN 18 07/01/2024   CREATININE 0.71 07/01/2024   BILITOT 0.7 07/01/2024   ALKPHOS 76 07/01/2024   AST 15 07/01/2024   ALT 10 07/01/2024   PROT 6.8 07/01/2024   ALBUMIN 3.9 07/01/2024   CALCIUM 9.2 07/01/2024   ANIONGAP 11 06/16/2024   GFR 88.41 07/01/2024   Lab Results  Component Value Date   CHOL 146 07/01/2024   Lab Results  Component Value Date   HDL 47.60 07/01/2024   Lab Results  Component Value Date   LDLCALC 69 07/01/2024   Lab Results  Component Value Date   TRIG 149.0 07/01/2024   Lab Results  Component Value Date   CHOLHDL 3 07/01/2024   Lab Results  Component Value Date   HGBA1C 5.6 07/01/2024       Assessment & Plan:  Atrial fibrillation, unspecified type (HCC) Assessment & Plan: Rate controlled and tolerating meds.    Benign essential HTN Assessment & Plan: Well controlled, no changes to meds. Encouraged heart healthy diet such as the DASH diet and exercise as tolerated.   Orders: -     Comprehensive metabolic panel with GFR -     CBC with Differential/Platelet  Centrilobular emphysema (HCC) Assessment & Plan: No recent exacerbation, follows with pulmonology   Hyperglycemia Assessment & Plan: hgba1c acceptable, minimize simple carbs. Increase exercise as tolerated.    Orders: -     Hemoglobin A1c  Hyperlipidemia, unspecified hyperlipidemia type Assessment & Plan: Encourage heart healthy  diet such as MIND or DASH diet, increase exercise, avoid trans fats, simple carbohydrates and processed foods, consider a krill or fish or flaxseed oil cap daily.   Orders: -     Lipid panel  Hypocalcemia -     VITAMIN D  25 Hydroxy (Vit-D Deficiency, Fractures)  Other orders -     oxyCODONE  HCl; Take 1 tablet (5 mg total) by mouth 3 (three) times daily as needed for moderate pain (pain score 4-6) or severe pain (pain score 7-10) (pain score 4-6). No more than 6 tablets daily.  Dispense: 60 tablet; Refill: 0    Assessment and Plan Assessment & Plan Status post right total knee arthroplasty with postoperative right knee pain One month post right knee total arthroplasty. Pain is more manageable but requires ongoing analgesia. No significant side effects from oxycodone  reported. Constipation managed with Docusate or Pericolase. - Prescribe 60 tablets of oxycodone  for 30 days, 1 tablet at bedtime and 0.5 tablet during the day as needed. - Continue Tylenol  500 mg, 2 tablets twice daily, with option to increase to 2 tablets three times daily if needed. - Monitor bowel movements and continue Docusate or Pericolase as needed. - Reassess pain management and oxycodone  use in one month.  Right hip pain Right hip pain likely due to altered gait post knee surgery. - Use topical treatments such as Salonpas patches, Voltaren  gel, or Aspercreme for pain relief. - Continue Tylenol  as per knee pain management plan.  Atrial fibrillation and left lower extremity deep vein thrombosis on anticoagulation Atrial fibrillation with recent episode lasting 10-15 minutes. Currently on Eliquis for anticoagulation due to AFib and left lower extremity DVT. Follow-up with cardiology scheduled. - Continue Eliquis as prescribed. - Follow up with cardiology on Monday for monitor evaluation and  long-term management plan.  Hypocalcemia and mild postoperative anemia Mild hypocalcemia post knee surgery, likely related to surgical stress. Mild anemia noted, common post orthopedic surgery. - Order vitamin D  level to assess status. - Continue multivitamin with minerals.  General Health Maintenance Discussion of vaccinations including flu, COVID, RSV, and pneumonia. Tetanus booster due. Recommendations provided for vaccination schedule. - Administer COVID and RSV vaccines at pharmacy today. - Plan for tetanus booster and Prevnar 20 in a few weeks. - Encourage hydration and adequate sleep.  Recording duration: 21 minutes     Harlene Horton, MD

## 2024-07-05 ENCOUNTER — Ambulatory Visit (HOSPITAL_COMMUNITY)
Admission: RE | Admit: 2024-07-05 | Discharge: 2024-07-05 | Disposition: A | Discharge status: Home / Self Care | Source: Ambulatory Visit | Attending: Physician Assistant | Admitting: Physician Assistant

## 2024-07-05 ENCOUNTER — Telehealth (HOSPITAL_COMMUNITY): Payer: Self-pay | Admitting: Pharmacy Technician

## 2024-07-05 ENCOUNTER — Other Ambulatory Visit (HOSPITAL_COMMUNITY): Payer: Self-pay

## 2024-07-05 ENCOUNTER — Encounter: Payer: Self-pay | Admitting: Radiology

## 2024-07-05 ENCOUNTER — Encounter (HOSPITAL_COMMUNITY): Payer: Self-pay | Admitting: Internal Medicine

## 2024-07-05 VITALS — BP 128/70 | HR 84 | Ht 67.0 in | Wt 146.6 lb

## 2024-07-05 DIAGNOSIS — I4891 Unspecified atrial fibrillation: Secondary | ICD-10-CM | POA: Diagnosis not present

## 2024-07-05 DIAGNOSIS — D6869 Other thrombophilia: Secondary | ICD-10-CM | POA: Diagnosis not present

## 2024-07-05 DIAGNOSIS — I480 Paroxysmal atrial fibrillation: Secondary | ICD-10-CM

## 2024-07-05 MED ORDER — APIXABAN 5 MG PO TABS: 5.0000 mg | ORAL_TABLET | Freq: Two times a day (BID) | ORAL | 6 refills | Status: DC

## 2024-07-05 NOTE — Telephone Encounter (Signed)
 Patient Product/process development scientist completed.    The patient is insured through [employer]. Patient has Medicare and is not eligible for a copay card, but may be able to apply for patient assistance or Medicare RX Payment Plan (Patient Must reach out to their plan, if eligible for payment plan), if available.    Ran test claim for Eliquis 5 mg and the current 30 day co-pay is $0.00.   This test claim was processed through [facility]- copay amounts may vary at other pharmacies due to pharmacy/plan contracts, or as the patient moves through the different stages of their insurance plan.     Reyes Sharps, CPHT Pharmacy Technician Patient Advocate Specialist Lead [facility] Patient Advocate Team Direct Number: [phone]  Fax: [phone]

## 2024-07-05 NOTE — Progress Notes (Signed)
 Primary Care Physician: Domenica Harlene LABOR, MD Primary Cardiologist: Maude Emmer, MD Electrophysiologist: None     Referring Physician: Dr. Kate Box [name] is a 78 y.o. male with a history of frequent PVCs, HTN, COPD, and lung abscess in 1996 s/p right and middle lower lobe resection who presents for consultation in the [facility]. S/p hospital admission 10/13-15/2025 for abdominal pain found to be in Afib with RVR. Acute DVT found and began on Eliquis starter pack. Patient is on Eliquis for stroke prevention.  On evaluation today, patient is currently in Afib. He is still wearing monitor. He notes as far as he can tell he has had only one episode of Afib while wearing the monitor since hospital discharge. He is taking Eliquis 5 mg BID. He has chronic SOB related to history of lung resection and COPD.   Today, he denies symptoms of palpitations, chest pain, orthopnea, PND, lower extremity edema, dizziness, presyncope, syncope, bleeding, or neurologic sequela. The patient is tolerating medications without difficulties and is otherwise without complaint today.    he has a BMI of Body mass index is 22.96 kg/m.SABRA Filed Weights   07/05/24 0820  Weight: 66.5 kg    Current Outpatient Medications  Medication Sig Dispense Refill   acetaminophen  (TYLENOL ) 500 MG tablet Take 500-1,000 mg by mouth See admin instructions. Take 1,000 mg by mouth in the morning and 500-1,000 mg at bedtime     albuterol  (VENTOLIN  HFA) 108 (90 Base) MCG/ACT inhaler INHALE 2 PUFFS INTO THE LUNGS EVERY 6 HOURS AS NEEDED FOR WHEEZING OR SHORTNESS OF BREATH. (Patient taking differently: Inhale 2 puffs into the lungs See admin instructions. Inhale 2 puffs into the lungs before) 2 each 10   APIXABAN (ELIQUIS) VTE STARTER PACK (10MG  AND 5MG ) Take as directed on package: start with two-5mg  tablets twice daily for 7 days. On day 8, switch to one-5mg  tablet twice daily. 74 each 0    CALCIUM CARBONATE ANTACID PO Take 1,000 mg by mouth in the morning.     cetirizine (ZYRTEC) 10 MG tablet Take 10 mg by mouth in the morning.     clobetasol (TEMOVATE) 0.05 % external solution Apply 1 Application topically See admin instructions. Apply to the affected areas 2 times a day as needed for irritation     diltiazem (CARDIZEM CD) 240 MG 24 hr capsule Take 1 capsule (240 mg total) by mouth daily. 30 capsule 0   fluticasone  (FLONASE ) 50 MCG/ACT nasal spray USE 2 SPRAYS IN EACH NOSTRIL EVERY DAY (Patient taking differently: Place 2 sprays into both nostrils in the morning.) 48 g 3   GLUCOSAMINE-CHONDROITIN PO Take 1 tablet by mouth 2 (two) times daily.     guaiFENesin  (MUCINEX ) 600 MG 12 hr tablet Take 600 mg by mouth 2 (two) times daily.     ipratropium (ATROVENT ) 0.06 % nasal spray Place 1 spray into both nostrils 4 (four) times daily. (Patient taking differently: Place 1 spray into both nostrils 4 (four) times daily as needed for rhinitis.) 15 mL 12   ketoconazole (NIZORAL) 2 % shampoo Apply 1 Application topically See admin instructions. Shampoo 2 times a week     loratadine (CLARITIN) 10 MG tablet Take 10 mg by mouth every evening.     methocarbamol (ROBAXIN) 500 MG tablet Take 1 tablet (500 mg total) by mouth every 6 (six) hours as needed for muscle spasms. 30 tablet 1   montelukast  (SINGULAIR ) 10 MG tablet TAKE  1 TABLET EVERY DAY 90 tablet 3   Multiple Vitamin (MULTIVITAMIN WITH MINERALS) TABS tablet Take 1 tablet by mouth daily with breakfast.     oxyCODONE  (OXY IR/ROXICODONE ) 5 MG immediate release tablet Take 1 tablet (5 mg total) by mouth 3 (three) times daily as needed for moderate pain (pain score 4-6) or severe pain (pain score 7-10) (pain score 4-6). No more than 6 tablets daily. 60 tablet 0   pantoprazole  (PROTONIX ) 20 MG tablet Take 1 tablet by mouth once daily (Patient taking differently: Take 20 mg by mouth every evening.) 90 tablet 3   TRELEGY ELLIPTA  100-62.5-25 MCG/ACT AEPB  INHALE 1 PUFF INTO THE LUNGS DAILY 60 each 11   triamcinolone  cream (KENALOG ) 0.1 % Apply 1 Application topically 2 (two) times daily as needed (for itching).     apixaban (ELIQUIS) 5 MG TABS tablet Take 1 tablet (5 mg total) by mouth 2 (two) times daily. 60 tablet 6   No current facility-administered medications for this encounter.    Atrial Fibrillation Management history:  Previous antiarrhythmic drugs: none Previous cardioversions: none Previous ablations: none Anticoagulation history: Eliquis   ROS- All systems are reviewed and negative except as per the HPI above.  Physical Exam: BP 128/70   Pulse 84   Ht 5' 7 (1.702 m)   Wt 66.5 kg   BMI 22.96 kg/m   GEN: Well nourished, well developed in no acute distress NECK: No JVD; No carotid bruits CARDIAC: Irregularly irregular rate and rhythm, no murmurs, rubs, gallops RESPIRATORY:  Expiratory wheeze noted ABDOMEN: Soft, non-tender, non-distended EXTREMITIES:  No edema; No deformity   EKG today demonstrates  Vent. rate 84 BPM PR interval * ms QRS duration 94 ms QT/QTcB 346/408 ms P-R-T axes * 25 48 Atrial fibrillation Abnormal ECG When compared with ECG of 14-Jun-2024 09:58, PREVIOUS ECG IS PRESENT  Echo 06/15/24 demonstrated  1. Left ventricular ejection fraction, by estimation, is 60 to 65%. The  left ventricle has normal function. Left ventricular endocardial border  not optimally defined to evaluate regional wall motion. Indeterminate  diastolic filling due to E-A fusion.   2. Right ventricular systolic function is normal. The right ventricular  size is mildly enlarged.   3. The mitral valve is grossly normal. Trivial mitral valve  regurgitation. No evidence of mitral stenosis.   4. The aortic valve was not well visualized. Aortic valve regurgitation  is not visualized. No aortic stenosis is present.   5. The inferior vena cava is normal in size with greater than 50%  respiratory variability, suggesting right  atrial pressure of 3 mmHg.   ASSESSMENT & PLAN CHA2DS2-VASc Score = 4  The patient's score is based upon: CHF History: 0 HTN History: 1 Diabetes History: 0 Stroke History: 0 Vascular Disease History: 1 Age Score: 2 Gender Score: 0       ASSESSMENT AND PLAN: Paroxysmal Atrial Fibrillation (ICD10:  I48.0) The patient's CHA2DS2-VASc score is 4, indicating a 4.8% annual risk of stroke.    Patient is currently in Afib. Education provided about Afib. Discussion about triggers for Afib and medication treatments and ablation going forward if indicated. After discussion, we will help patient mail monitor and follow up to discuss results.   Secondary Hypercoagulable State (ICD10:  D68.69) The patient is at significant risk for stroke/thromboembolism based upon his CHA2DS2-VASc Score of 4.  Continue Apixaban (Eliquis).  We discussed the reasoning behind anticoagulation in the setting of stroke prevention related to Afib. We discussed the benefits vs risks  of anticoagulation. After discussion, patient would like to continue anticoagulation and understands the potential risks.    Follow up 4 weeks to discuss monitor results.    Terra Pac, [facility]  Afib Clinic [address] [place], [place] [address] [phone]

## 2024-07-06 DIAGNOSIS — Z96651 Presence of right artificial knee joint: Secondary | ICD-10-CM | POA: Diagnosis not present

## 2024-07-06 DIAGNOSIS — M1711 Unilateral primary osteoarthritis, right knee: Secondary | ICD-10-CM | POA: Diagnosis not present

## 2024-07-06 DIAGNOSIS — M25561 Pain in right knee: Secondary | ICD-10-CM | POA: Diagnosis not present

## 2024-07-08 DIAGNOSIS — M25561 Pain in right knee: Secondary | ICD-10-CM | POA: Diagnosis not present

## 2024-07-08 DIAGNOSIS — Z96651 Presence of right artificial knee joint: Secondary | ICD-10-CM | POA: Diagnosis not present

## 2024-07-08 DIAGNOSIS — M1711 Unilateral primary osteoarthritis, right knee: Secondary | ICD-10-CM | POA: Diagnosis not present

## 2024-07-13 DIAGNOSIS — M25561 Pain in right knee: Secondary | ICD-10-CM | POA: Diagnosis not present

## 2024-07-13 DIAGNOSIS — Z96651 Presence of right artificial knee joint: Secondary | ICD-10-CM | POA: Diagnosis not present

## 2024-07-13 DIAGNOSIS — M1711 Unilateral primary osteoarthritis, right knee: Secondary | ICD-10-CM | POA: Diagnosis not present

## 2024-07-13 DIAGNOSIS — I4891 Unspecified atrial fibrillation: Secondary | ICD-10-CM | POA: Diagnosis not present

## 2024-07-14 DIAGNOSIS — I4891 Unspecified atrial fibrillation: Secondary | ICD-10-CM | POA: Diagnosis not present

## 2024-07-15 ENCOUNTER — Ambulatory Visit: Payer: Self-pay | Admitting: Cardiology

## 2024-07-15 DIAGNOSIS — M25561 Pain in right knee: Secondary | ICD-10-CM | POA: Diagnosis not present

## 2024-07-15 DIAGNOSIS — M1711 Unilateral primary osteoarthritis, right knee: Secondary | ICD-10-CM | POA: Diagnosis not present

## 2024-07-15 DIAGNOSIS — Z96651 Presence of right artificial knee joint: Secondary | ICD-10-CM | POA: Diagnosis not present

## 2024-07-19 ENCOUNTER — Encounter: Payer: Self-pay | Admitting: Orthopaedic Surgery

## 2024-07-19 ENCOUNTER — Ambulatory Visit (INDEPENDENT_AMBULATORY_CARE_PROVIDER_SITE_OTHER): Admitting: Orthopaedic Surgery

## 2024-07-19 DIAGNOSIS — D229 Melanocytic nevi, unspecified: Secondary | ICD-10-CM | POA: Diagnosis not present

## 2024-07-19 DIAGNOSIS — L218 Other seborrheic dermatitis: Secondary | ICD-10-CM | POA: Diagnosis not present

## 2024-07-19 DIAGNOSIS — Z08 Encounter for follow-up examination after completed treatment for malignant neoplasm: Secondary | ICD-10-CM | POA: Diagnosis not present

## 2024-07-19 DIAGNOSIS — Z85828 Personal history of other malignant neoplasm of skin: Secondary | ICD-10-CM | POA: Diagnosis not present

## 2024-07-19 DIAGNOSIS — L821 Other seborrheic keratosis: Secondary | ICD-10-CM | POA: Diagnosis not present

## 2024-07-19 DIAGNOSIS — D1801 Hemangioma of skin and subcutaneous tissue: Secondary | ICD-10-CM | POA: Diagnosis not present

## 2024-07-19 DIAGNOSIS — Z96651 Presence of right artificial knee joint: Secondary | ICD-10-CM

## 2024-07-19 DIAGNOSIS — Z85820 Personal history of malignant melanoma of skin: Secondary | ICD-10-CM | POA: Diagnosis not present

## 2024-07-19 DIAGNOSIS — L738 Other specified follicular disorders: Secondary | ICD-10-CM | POA: Diagnosis not present

## 2024-07-19 DIAGNOSIS — L814 Other melanin hyperpigmentation: Secondary | ICD-10-CM | POA: Diagnosis not present

## 2024-07-19 NOTE — Progress Notes (Signed)
 The patient is now 6 weeks status post a right total knee arthroplasty.  He ended up having a DVT in his left leg and is on Eliquis now.  He has been in therapy for his right knee.  He says he is doing well overall.  He is requesting injection in his left knee today and his left shoulder.  On exam his right knee has almost full extension and flexion is to just barely past 90 degrees.  I did try to aspirate fluid off of his right knee but was unsuccessful but I did place a steroid injection in the right knee as well as the left knee today.  I said we would hold off on a steroid injection for the left shoulder until his next visit.  Will see him back in a month to see how his range of motion is coming along with his right knee and then consider steroid injection in the left shoulder if he still having left shoulder issues.  At some point we will need to pursue repeat Doppler studies for the left lower extremity because were not sure how long they are recommending him be on Eliquis.  He will continue Eliquis for the time being as well and he understands that.

## 2024-07-20 DIAGNOSIS — M25561 Pain in right knee: Secondary | ICD-10-CM | POA: Diagnosis not present

## 2024-07-20 DIAGNOSIS — Z96651 Presence of right artificial knee joint: Secondary | ICD-10-CM | POA: Diagnosis not present

## 2024-07-20 DIAGNOSIS — M1711 Unilateral primary osteoarthritis, right knee: Secondary | ICD-10-CM | POA: Diagnosis not present

## 2024-07-22 DIAGNOSIS — M1711 Unilateral primary osteoarthritis, right knee: Secondary | ICD-10-CM | POA: Diagnosis not present

## 2024-07-22 DIAGNOSIS — Z96651 Presence of right artificial knee joint: Secondary | ICD-10-CM | POA: Diagnosis not present

## 2024-07-22 DIAGNOSIS — M25561 Pain in right knee: Secondary | ICD-10-CM | POA: Diagnosis not present

## 2024-07-26 DIAGNOSIS — M25561 Pain in right knee: Secondary | ICD-10-CM | POA: Diagnosis not present

## 2024-07-26 DIAGNOSIS — Z96651 Presence of right artificial knee joint: Secondary | ICD-10-CM | POA: Diagnosis not present

## 2024-07-26 DIAGNOSIS — M1711 Unilateral primary osteoarthritis, right knee: Secondary | ICD-10-CM | POA: Diagnosis not present

## 2024-07-28 DIAGNOSIS — M1711 Unilateral primary osteoarthritis, right knee: Secondary | ICD-10-CM | POA: Diagnosis not present

## 2024-07-28 DIAGNOSIS — M25561 Pain in right knee: Secondary | ICD-10-CM | POA: Diagnosis not present

## 2024-07-28 DIAGNOSIS — Z96651 Presence of right artificial knee joint: Secondary | ICD-10-CM | POA: Diagnosis not present

## 2024-08-04 ENCOUNTER — Encounter (HOSPITAL_COMMUNITY): Payer: Self-pay | Admitting: Internal Medicine

## 2024-08-04 ENCOUNTER — Ambulatory Visit (HOSPITAL_COMMUNITY)
Admission: RE | Admit: 2024-08-04 | Discharge: 2024-08-04 | Disposition: A | Source: Ambulatory Visit | Attending: Internal Medicine | Admitting: Internal Medicine

## 2024-08-04 VITALS — BP 134/86 | HR 109 | Ht 67.0 in | Wt 148.2 lb

## 2024-08-04 DIAGNOSIS — I4819 Other persistent atrial fibrillation: Secondary | ICD-10-CM | POA: Diagnosis not present

## 2024-08-04 DIAGNOSIS — D6869 Other thrombophilia: Secondary | ICD-10-CM

## 2024-08-04 DIAGNOSIS — I480 Paroxysmal atrial fibrillation: Secondary | ICD-10-CM

## 2024-08-04 MED ORDER — RIVAROXABAN 20 MG PO TABS: 20.0000 mg | ORAL_TABLET | Freq: Every day | ORAL | 6 refills | Status: AC

## 2024-08-04 NOTE — Patient Instructions (Addendum)
 Stop Eliquis    Start rivaroxaban (XARELTO) 20 MG once daily with supper  Cardioversion scheduled for: 08/30/24 Monday  7:30 am    - Arrive at the [facility] ([address])  and check in with ADMITTING at 7:30am    - Do not eat or drink anything after midnight the night prior to your procedure.   - Take all your morning medication (except diabetic medications) with a sip of water  prior to arrival.  - Do NOT miss any doses of your blood thinner - if you should miss a dose or take a dose more than 4 hours late -- please notify our office immediately.  - You will not be able to drive home after your procedure. Please ensure you have a responsible adult to drive you home. You will need someone with you for 24 hours post procedure.     - Expect to be in the procedural area approximately 2 hours.   - If you feel as if you go back into normal rhythm prior to scheduled cardioversion, please notify our office immediately.   If your procedure is canceled in the cardioversion suite you will be charged a cancellation fee.

## 2024-08-04 NOTE — Progress Notes (Signed)
 Primary Care Physician: Domenica Harlene LABOR, MD Primary Cardiologist: Maude Emmer, MD Electrophysiologist: None     Referring Physician: Dr. Kate Box [name] is a 78 y.o. male with a history of frequent PVCs, HTN, COPD, and lung abscess in 1996 s/p right and middle lower lobe resection who presents for consultation in the [facility]. S/p hospital admission 10/13-15/2025 for abdominal pain found to be in Afib with RVR. Acute DVT found and began on Eliquis  starter pack. Patient is on Eliquis  for stroke prevention.  On evaluation today, patient is currently in Afib. He is still wearing monitor. He notes as far as he can tell he has had only one episode of Afib while wearing the monitor since hospital discharge. He is taking Eliquis  5 mg BID. He has chronic SOB related to history of lung resection and COPD.   On follow-up 08/3024, patient is currently in A-fib.  He wore a cardiac monitor for 14 days and it showed 95% A-fib burden.  Arrhythmia was detected at deactivation of device.  Patient has been taking Eliquis  5 mg once daily due to a rash developing under his eyes after he began the medication.  He was previously taking it twice a day but then transition to once daily due to the rash.  He does not appear to have cardiac awareness of arrhythmia.  Today, he denies symptoms of palpitations, chest pain, orthopnea, PND, lower extremity edema, dizziness, presyncope, syncope, bleeding, or neurologic sequela. The patient is tolerating medications without difficulties and is otherwise without complaint today.    he has a BMI of Body mass index is 23.21 kg/m.SABRA Filed Weights   08/04/24 1125  Weight: 67.2 kg     Current Outpatient Medications  Medication Sig Dispense Refill   acetaminophen  (TYLENOL ) 500 MG tablet Take 500-1,000 mg by mouth See admin instructions. Take 1,000 mg by mouth in the morning and 500-1,000 mg at bedtime     albuterol  (VENTOLIN  HFA)  108 (90 Base) MCG/ACT inhaler INHALE 2 PUFFS INTO THE LUNGS EVERY 6 HOURS AS NEEDED FOR WHEEZING OR SHORTNESS OF BREATH. (Patient taking differently: Inhale 2 puffs into the lungs See admin instructions. Inhale 2 puffs into the lungs before) 2 each 10   amLODipine  (NORVASC ) 5 MG tablet Take 5 mg by mouth daily.     CALCIUM CARBONATE ANTACID PO Take 1,000 mg by mouth in the morning.     cetirizine (ZYRTEC) 10 MG tablet Take 10 mg by mouth in the morning.     clobetasol (TEMOVATE) 0.05 % external solution Apply 1 Application topically See admin instructions. Apply to the affected areas 2 times a day as needed for irritation     diltiazem  (CARDIZEM  CD) 240 MG 24 hr capsule Take 1 capsule (240 mg total) by mouth daily. 30 capsule 0   fluticasone  (FLONASE ) 50 MCG/ACT nasal spray USE 2 SPRAYS IN EACH NOSTRIL EVERY DAY (Patient taking differently: Place 2 sprays into both nostrils in the morning.) 48 g 3   GLUCOSAMINE-CHONDROITIN PO Take 1 tablet by mouth 2 (two) times daily.     guaiFENesin  (MUCINEX ) 600 MG 12 hr tablet Take 600 mg by mouth 2 (two) times daily.     ipratropium (ATROVENT ) 0.06 % nasal spray Place 1 spray into both nostrils 4 (four) times daily. (Patient taking differently: Place 1 spray into both nostrils 4 (four) times daily as needed for rhinitis.) 15 mL 12   ketoconazole (NIZORAL) 2 % shampoo  Apply 1 Application topically See admin instructions. Shampoo 2 times a week     loratadine  (CLARITIN ) 10 MG tablet Take 10 mg by mouth every evening.     methocarbamol  (ROBAXIN ) 500 MG tablet Take 1 tablet (500 mg total) by mouth every 6 (six) hours as needed for muscle spasms. 30 tablet 1   montelukast  (SINGULAIR ) 10 MG tablet TAKE 1 TABLET EVERY DAY 90 tablet 3   Multiple Vitamin (MULTIVITAMIN WITH MINERALS) TABS tablet Take 1 tablet by mouth daily with breakfast.     oxyCODONE  (OXY IR/ROXICODONE ) 5 MG immediate release tablet Take 1 tablet (5 mg total) by mouth 3 (three) times daily as needed for  moderate pain (pain score 4-6) or severe pain (pain score 7-10) (pain score 4-6). No more than 6 tablets daily. 60 tablet 0   pantoprazole  (PROTONIX ) 20 MG tablet Take 1 tablet by mouth once daily (Patient taking differently: Take 20 mg by mouth every evening.) 90 tablet 3   TRELEGY ELLIPTA  100-62.5-25 MCG/ACT AEPB INHALE 1 PUFF INTO THE LUNGS DAILY 60 each 11   triamcinolone  cream (KENALOG ) 0.1 % Apply 1 Application topically 2 (two) times daily as needed (for itching).     No current facility-administered medications for this encounter.    Atrial Fibrillation Management history:  Previous antiarrhythmic drugs: none Previous cardioversions: none Previous ablations: none Anticoagulation history: Eliquis    ROS- All systems are reviewed and negative except as per the HPI above.  Physical Exam: BP 134/86   Pulse (!) 109   Ht 5' 7 (1.702 m)   Wt 67.2 kg   BMI 23.21 kg/m   GEN- The patient is well appearing, alert and oriented x 3 today.   Neck - no JVD or carotid bruit noted Lungs- Clear to ausculation bilaterally, normal work of breathing Heart- Irregular tachycardic rate and rhythm, no murmurs, rubs or gallops, PMI not laterally displaced Extremities- no clubbing, cyanosis, or edema Skin - no rash or ecchymosis noted   EKG today demonstrates  EKG Interpretation Date/Time:  Wednesday August 04 2024 11:28:47 EST Ventricular Rate:  109 PR Interval:    QRS Duration:  90 QT Interval:  330 QTC Calculation: 444 R Axis:   34  Text Interpretation: Atrial fibrillation with rapid ventricular response with premature ventricular or aberrantly conducted complexes Abnormal ECG When compared with ECG of 05-Jul-2024 08:24, No significant change was found Confirmed by Terra Pac ([id number]) on 08/04/2024 11:30:28 AM    Echo 06/15/24 demonstrated  1. Left ventricular ejection fraction, by estimation, is 60 to 65%. The  left ventricle has normal function. Left ventricular endocardial  border  not optimally defined to evaluate regional wall motion. Indeterminate  diastolic filling due to E-A fusion.   2. Right ventricular systolic function is normal. The right ventricular  size is mildly enlarged.   3. The mitral valve is grossly normal. Trivial mitral valve  regurgitation. No evidence of mitral stenosis.   4. The aortic valve was not well visualized. Aortic valve regurgitation  is not visualized. No aortic stenosis is present.   5. The inferior vena cava is normal in size with greater than 50%  respiratory variability, suggesting right atrial pressure of 3 mmHg.   ASSESSMENT & PLAN CHA2DS2-VASc Score = 4  The patient's score is based upon: CHF History: 0 HTN History: 1 Diabetes History: 0 Stroke History: 0 Vascular Disease History: 1 Age Score: 2 Gender Score: 0       ASSESSMENT AND PLAN: Persistent Atrial Fibrillation (ICD10:  I48.0) The patient's CHA2DS2-VASc score is 4, indicating a 4.8% annual risk of stroke.    Patient is currently in Afib with RVR. We discussed the procedure cardioversion to try to convert to NSR. We discussed the risks vs benefits of this procedure and how ultimately we cannot predict whether a patient will have early return of arrhythmia post procedure. After discussion, the patient wishes to proceed with cardioversion. Labs drawn today.  We will schedule cardioversion after at least 3 weeks of uninterrupted anticoagulation.  Informed Consent   Shared Decision Making/Informed Consent The risks (stroke, cardiac arrhythmias rarely resulting in the need for a temporary or permanent pacemaker, skin irritation or burns and complications associated with conscious sedation including aspiration, arrhythmia, respiratory failure and death), benefits (restoration of normal sinus rhythm) and alternatives of a direct current cardioversion were explained in detail to Mr. Whittenberg and he agrees to proceed.       Secondary Hypercoagulable State (ICD10:   D68.69) The patient is at significant risk for stroke/thromboembolism based upon his CHA2DS2-VASc Score of 4.   Patient's most recent creatinine is 0.71.  Due to patient noting rash after beginning Eliquis , we will transition to Xarelto 20 mg once daily.  He will take Eliquis  tonight and then tomorrow begin Xarelto once daily.    Follow up 2 weeks after DCCV.   Terra Pac, [facility]  Afib Clinic [address] [place], [place] [address] [phone]

## 2024-08-05 ENCOUNTER — Ambulatory Visit (HOSPITAL_COMMUNITY): Payer: Self-pay | Admitting: Internal Medicine

## 2024-08-05 LAB — BASIC METABOLIC PANEL WITH GFR
BUN/Creatinine Ratio: 34 — ABNORMAL HIGH (ref 10–24)
BUN: 22 mg/dL (ref 8–27)
CO2: 22 mmol/L (ref 20–29)
Calcium: 9.2 mg/dL (ref 8.6–10.2)
Chloride: 104 mmol/L (ref 96–106)
Creatinine, Ser: 0.64 mg/dL — ABNORMAL LOW (ref 0.76–1.27)
Glucose: 106 mg/dL — ABNORMAL HIGH (ref 70–99)
Potassium: 4.4 mmol/L (ref 3.5–5.2)
Sodium: 139 mmol/L (ref 134–144)
eGFR: 98 mL/min/1.73 (ref >59)

## 2024-08-05 LAB — CBC
Hematocrit: 40.9 % (ref 37.5–51.0)
Hemoglobin: 13 g/dL (ref 13.0–17.7)
MCH: 29.8 pg (ref 26.6–33.0)
MCHC: 31.8 g/dL (ref 31.5–35.7)
MCV: 94 fL (ref 79–97)
Platelets: 267 x10E3/uL (ref 150–450)
RBC: 4.36 x10E6/uL (ref 4.14–5.80)
RDW: 12.4 % (ref 11.6–15.4)
WBC: 6.3 x10E3/uL (ref 3.4–10.8)

## 2024-08-06 ENCOUNTER — Ambulatory Visit (HOSPITAL_COMMUNITY): Admitting: Internal Medicine

## 2024-08-16 ENCOUNTER — Encounter: Payer: Self-pay | Admitting: Orthopaedic Surgery

## 2024-08-16 ENCOUNTER — Ambulatory Visit: Admitting: Orthopaedic Surgery

## 2024-08-16 DIAGNOSIS — Z96651 Presence of right artificial knee joint: Secondary | ICD-10-CM

## 2024-08-16 NOTE — Progress Notes (Signed)
 The patient is now about 10 weeks status post a right total knee replacement to treat significant right knee pain and arthritis.  He is an active 78 year old gentleman.  He is on chronic blood thinners due to being in A-fib.  We replaced his knee in early October.  He is ambulate with a cane.  On exam his right knee has almost full extension and his flexion is to past 90 degrees.  The knee feels stable overall.  He says he mainly uses a cane only when he is outside of the house.  Overall though he does look good and he is pleased.  From our standpoint we will see him back in 3 months.  Will have a standing AP and lateral of his right operative knee at that visit.  All questions and concerns were addressed and answered.

## 2024-08-30 ENCOUNTER — Encounter (HOSPITAL_COMMUNITY): Admission: RE | Payer: Self-pay

## 2024-08-30 ENCOUNTER — Other Ambulatory Visit (HOSPITAL_COMMUNITY): Payer: Self-pay | Admitting: *Deleted

## 2024-08-30 ENCOUNTER — Ambulatory Visit (HOSPITAL_COMMUNITY): Admission: RE | Admit: 2024-08-30 | Source: Home / Self Care | Admitting: Cardiology

## 2024-08-30 SURGERY — CARDIOVERSION (CATH LAB)
Anesthesia: General

## 2024-09-07 NOTE — Progress Notes (Signed)
 Patient Called with the Following Pre-Procedure instructions:   Time of Arrival for Procedure:1000  NPO at midnight prior to procedure day besides sips of water  with morning medications  Patient must have a ride home and someone to stay with them for 24 hours post procedure  Instructed to take following medications morning prior to procedure: Xarelto  at night, blood pressure medications in AM

## 2024-09-08 ENCOUNTER — Ambulatory Visit (HOSPITAL_COMMUNITY): Admitting: Anesthesiology

## 2024-09-08 ENCOUNTER — Telehealth: Payer: Self-pay | Admitting: Cardiovascular Disease

## 2024-09-08 ENCOUNTER — Encounter (HOSPITAL_COMMUNITY): Payer: Self-pay | Admitting: Cardiovascular Disease

## 2024-09-08 ENCOUNTER — Encounter (HOSPITAL_COMMUNITY): Admission: RE | Disposition: A | Payer: Self-pay | Source: Home / Self Care | Attending: Cardiovascular Disease

## 2024-09-08 ENCOUNTER — Other Ambulatory Visit: Payer: Self-pay

## 2024-09-08 ENCOUNTER — Ambulatory Visit (HOSPITAL_COMMUNITY)
Admission: RE | Admit: 2024-09-08 | Discharge: 2024-09-08 | Disposition: A | Discharge status: Home / Self Care | Attending: Cardiovascular Disease | Admitting: Cardiovascular Disease

## 2024-09-08 DIAGNOSIS — I10 Essential (primary) hypertension: Secondary | ICD-10-CM

## 2024-09-08 DIAGNOSIS — J449 Chronic obstructive pulmonary disease, unspecified: Secondary | ICD-10-CM | POA: Insufficient documentation

## 2024-09-08 DIAGNOSIS — Z87891 Personal history of nicotine dependence: Secondary | ICD-10-CM

## 2024-09-08 DIAGNOSIS — I4891 Unspecified atrial fibrillation: Secondary | ICD-10-CM

## 2024-09-08 DIAGNOSIS — I4819 Other persistent atrial fibrillation: Secondary | ICD-10-CM | POA: Insufficient documentation

## 2024-09-08 DIAGNOSIS — Z902 Acquired absence of lung [part of]: Secondary | ICD-10-CM | POA: Insufficient documentation

## 2024-09-08 DIAGNOSIS — K219 Gastro-esophageal reflux disease without esophagitis: Secondary | ICD-10-CM | POA: Insufficient documentation

## 2024-09-08 DIAGNOSIS — Z006 Encounter for examination for normal comparison and control in clinical research program: Secondary | ICD-10-CM

## 2024-09-08 HISTORY — PX: CARDIOVERSION: EP1203

## 2024-09-08 LAB — POCT I-STAT, CHEM 8
BUN: 24 mg/dL — ABNORMAL HIGH (ref 8–23)
Calcium, Ion: 1.15 mmol/L (ref 1.15–1.40)
Chloride: 104 mmol/L (ref 98–111)
Creatinine, Ser: 0.8 mg/dL (ref 0.61–1.24)
Glucose, Bld: 106 mg/dL — ABNORMAL HIGH (ref 70–99)
HCT: 43 % (ref 39.0–52.0)
Hemoglobin: 14.6 g/dL (ref 13.0–17.0)
Potassium: 5.4 mmol/L — ABNORMAL HIGH (ref 3.5–5.1)
Sodium: 138 mmol/L (ref 135–145)
TCO2: 26 mmol/L (ref 22–32)

## 2024-09-08 MED ORDER — SODIUM CHLORIDE 0.9% FLUSH: 3.0000 mL | INTRAVENOUS | Status: DC | PRN

## 2024-09-08 MED ORDER — AMIODARONE IV BOLUS ONLY 150 MG/100ML
INTRAVENOUS | Status: DC | PRN
  Administered 2024-09-08: 150 mg via INTRAVENOUS

## 2024-09-08 MED ORDER — PROPOFOL 10 MG/ML IV BOLUS
INTRAVENOUS | Status: DC | PRN
  Administered 2024-09-08: 60 mg via INTRAVENOUS

## 2024-09-08 MED ORDER — DILTIAZEM HCL ER COATED BEADS 240 MG PO CP24: 240.0000 mg | ORAL_CAPSULE | Freq: Two times a day (BID) | ORAL | 0 refills | Status: DC

## 2024-09-08 MED ORDER — SODIUM CHLORIDE 0.9% FLUSH: 3.0000 mL | Freq: Two times a day (BID) | INTRAVENOUS | Status: DC

## 2024-09-08 MED ORDER — SODIUM CHLORIDE 0.9 % IV SOLN: 250.0000 mL | INTRAVENOUS | Status: DC | PRN

## 2024-09-08 MED ORDER — LIDOCAINE 2% (20 MG/ML) 5 ML SYRINGE
INTRAMUSCULAR | Status: DC | PRN
  Administered 2024-09-08: 80 mg via INTRAVENOUS

## 2024-09-08 NOTE — Research (Signed)
 [facility] Cardioversion Informed Consent   Subject Name: [name]  Subject met inclusion and exclusion criteria.  The informed consent form, study requirements and expectations were reviewed with the subject and questions and concerns were addressed prior to the signing of the consent form.  The subject verbalized understanding of the trial requirements.  The subject agreed to participate in the [facility] Cardioversion trial and signed the informed consent at 0953 on 07/Jan/2026.  The informed consent was obtained prior to performance of any protocol-specific procedures for the subject.  A copy of the signed informed consent was given to the subject and a copy was placed in the subject's medical record.   [name]

## 2024-09-08 NOTE — Discharge Instructions (Signed)

## 2024-09-08 NOTE — H&P (Signed)
 Primary Care Physician: Domenica Harlene LABOR, MD Primary Cardiologist: Maude Emmer, MD Electrophysiologist: None      Referring Physician: Dr. Kate Box [name] is a 79 y.o. male with a history of frequent PVCs, HTN, COPD, and lung abscess in 1996 s/p right and middle lower lobe resection who presents for consultation in the [facility]. S/p hospital admission 10/13-15/2025 for abdominal pain found to be in Afib with RVR. Acute DVT found and began on Eliquis  starter pack. Patient is on Eliquis  for stroke prevention.   On evaluation today, patient is currently in Afib. He is still wearing monitor. He notes as far as he can tell he has had only one episode of Afib while wearing the monitor since hospital discharge. He is taking Eliquis  5 mg BID. He has chronic SOB related to history of lung resection and COPD.    On follow-up 08/3024, patient is currently in A-fib.  He wore a cardiac monitor for 14 days and it showed 95% A-fib burden.  Arrhythmia was detected at deactivation of device.  Patient has been taking Eliquis  5 mg once daily due to a rash developing under his eyes after he began the medication.  He was previously taking it twice a day but then transition to once daily due to the rash.  He does not appear to have cardiac awareness of arrhythmia.   Today, he denies symptoms of palpitations, chest pain, orthopnea, PND, lower extremity edema, dizziness, presyncope, syncope, bleeding, or neurologic sequela. The patient is tolerating medications without difficulties and is otherwise without complaint today.      he has a BMI of Body mass index is 23.21 kg/m.SABRA    Filed Weights    08/04/24 1125  Weight: 67.2 kg              Current Outpatient Medications  Medication Sig Dispense Refill   acetaminophen  (TYLENOL ) 500 MG tablet Take 500-1,000 mg by mouth See admin instructions. Take 1,000 mg by mouth in the morning and 500-1,000 mg at bedtime        albuterol  (VENTOLIN  HFA) 108 (90 Base) MCG/ACT inhaler INHALE 2 PUFFS INTO THE LUNGS EVERY 6 HOURS AS NEEDED FOR WHEEZING OR SHORTNESS OF BREATH. (Patient taking differently: Inhale 2 puffs into the lungs See admin instructions. Inhale 2 puffs into the lungs before) 2 each 10   amLODipine  (NORVASC ) 5 MG tablet Take 5 mg by mouth daily.       CALCIUM CARBONATE ANTACID PO Take 1,000 mg by mouth in the morning.       cetirizine (ZYRTEC) 10 MG tablet Take 10 mg by mouth in the morning.       clobetasol (TEMOVATE) 0.05 % external solution Apply 1 Application topically See admin instructions. Apply to the affected areas 2 times a day as needed for irritation       diltiazem  (CARDIZEM  CD) 240 MG 24 hr capsule Take 1 capsule (240 mg total) by mouth daily. 30 capsule 0   fluticasone  (FLONASE ) 50 MCG/ACT nasal spray USE 2 SPRAYS IN EACH NOSTRIL EVERY DAY (Patient taking differently: Place 2 sprays into both nostrils in the morning.) 48 g 3   GLUCOSAMINE-CHONDROITIN PO Take 1 tablet by mouth 2 (two) times daily.       guaiFENesin  (MUCINEX ) 600 MG 12 hr tablet Take 600 mg by mouth 2 (two) times daily.       ipratropium (ATROVENT ) 0.06 % nasal spray Place 1 spray into both  nostrils 4 (four) times daily. (Patient taking differently: Place 1 spray into both nostrils 4 (four) times daily as needed for rhinitis.) 15 mL 12   ketoconazole (NIZORAL) 2 % shampoo Apply 1 Application topically See admin instructions. Shampoo 2 times a week       loratadine  (CLARITIN ) 10 MG tablet Take 10 mg by mouth every evening.       methocarbamol  (ROBAXIN ) 500 MG tablet Take 1 tablet (500 mg total) by mouth every 6 (six) hours as needed for muscle spasms. 30 tablet 1   montelukast  (SINGULAIR ) 10 MG tablet TAKE 1 TABLET EVERY DAY 90 tablet 3   Multiple Vitamin (MULTIVITAMIN WITH MINERALS) TABS tablet Take 1 tablet by mouth daily with breakfast.       oxyCODONE  (OXY IR/ROXICODONE ) 5 MG immediate release tablet Take 1 tablet (5 mg total)  by mouth 3 (three) times daily as needed for moderate pain (pain score 4-6) or severe pain (pain score 7-10) (pain score 4-6). No more than 6 tablets daily. 60 tablet 0   pantoprazole  (PROTONIX ) 20 MG tablet Take 1 tablet by mouth once daily (Patient taking differently: Take 20 mg by mouth every evening.) 90 tablet 3   TRELEGY ELLIPTA  100-62.5-25 MCG/ACT AEPB INHALE 1 PUFF INTO THE LUNGS DAILY 60 each 11   triamcinolone  cream (KENALOG ) 0.1 % Apply 1 Application topically 2 (two) times daily as needed (for itching).          No current facility-administered medications for this encounter.        Atrial Fibrillation Management history:   Previous antiarrhythmic drugs: none Previous cardioversions: none Previous ablations: none Anticoagulation history: Eliquis      ROS- All systems are reviewed and negative except as per the HPI above.   Physical Exam: BP 134/86   Pulse (!) 109   Ht 5' 7 (1.702 m)   Wt 67.2 kg   BMI 23.21 kg/m    GEN- The patient is well appearing, alert and oriented x 3 today.   Neck - no JVD or carotid bruit noted Lungs- Clear to ausculation bilaterally, normal work of breathing Heart- Irregular tachycardic rate and rhythm, no murmurs, rubs or gallops, PMI not laterally displaced Extremities- no clubbing, cyanosis, or edema Skin - no rash or ecchymosis noted     EKG today demonstrates  EKG Interpretation Date/Time:                  Wednesday August 04 2024 11:28:47 EST Ventricular Rate:         109 PR Interval:                   QRS Duration:             90 QT Interval:                 330 QTC Calculation:444 R Axis:                         34   Text Interpretation:Atrial fibrillation with rapid ventricular response with premature ventricular or aberrantly conducted complexes Abnormal ECG When compared with ECG of 05-Jul-2024 08:24, No significant change was found Confirmed by Terra Pac ([id number]) on 08/04/2024 11:30:28 AM      Echo 06/15/24  demonstrated  1. Left ventricular ejection fraction, by estimation, is 60 to 65%. The  left ventricle has normal function. Left ventricular endocardial border  not optimally defined to evaluate regional wall motion. Indeterminate  diastolic filling due to E-A fusion.   2. Right ventricular systolic function is normal. The right ventricular  size is mildly enlarged.   3. The mitral valve is grossly normal. Trivial mitral valve  regurgitation. No evidence of mitral stenosis.   4. The aortic valve was not well visualized. Aortic valve regurgitation  is not visualized. No aortic stenosis is present.   5. The inferior vena cava is normal in size with greater than 50%  respiratory variability, suggesting right atrial pressure of 3 mmHg.    ASSESSMENT & PLAN CHA2DS2-VASc Score = 4  The patient's score is based upon: CHF History: 0 HTN History: 1 Diabetes History: 0 Stroke History: 0 Vascular Disease History: 1 Age Score: 2 Gender Score: 0         ASSESSMENT AND PLAN: Persistent Atrial Fibrillation (ICD10:  I48.0) The patient's CHA2DS2-VASc score is 4, indicating a 4.8% annual risk of stroke.     Patient is currently in Afib with RVR. We discussed the procedure cardioversion to try to convert to NSR. We discussed the risks vs benefits of this procedure and how ultimately we cannot predict whether a patient will have early return of arrhythmia post procedure. After discussion, the patient wishes to proceed with cardioversion. Labs drawn today.  We will schedule cardioversion after at least 3 weeks of uninterrupted anticoagulation.   Informed Consent Shared Decision Making/Informed Consent The risks (stroke, cardiac arrhythmias rarely resulting in the need for a temporary or permanent pacemaker, skin irritation or burns and complications associated with conscious sedation including aspiration, arrhythmia, respiratory failure and death), benefits (restoration of normal sinus rhythm) and  alternatives of a direct current cardioversion were explained in detail to Mr. Tappan and he agrees to proceed.          Secondary Hypercoagulable State (ICD10:  D68.69) The patient is at significant risk for stroke/thromboembolism based upon his CHA2DS2-VASc Score of 4.   Patient's most recent creatinine is 0.71.  Due to patient noting rash after beginning Eliquis , we will transition to Xarelto  20 mg once daily.  He will take Eliquis  tonight and then tomorrow begin Xarelto  once daily.       Follow up 2 weeks after DCCV.

## 2024-09-08 NOTE — Anesthesia Preprocedure Evaluation (Signed)
"                                    Anesthesia Evaluation  Patient identified by MRN, date of birth, ID band Patient awake    Reviewed: Allergy & Precautions, NPO status , Patient's Chart, lab work & pertinent test results, reviewed documented beta blocker date and time   History of Anesthesia Complications Negative for: history of anesthetic complications  Airway Mallampati: II  TM Distance: >3 FB     Dental  (+) Edentulous Upper, Edentulous Lower   Pulmonary shortness of breath and with exertion, pneumonia, COPD (status post R lobectomy),  COPD inhaler, neg recent URI, former smoker   breath sounds clear to auscultation       Cardiovascular hypertension, (-) angina (-) CAD, (-) Past MI and (-) Cardiac Stents + dysrhythmias (marked ectopy noted in preop holding, appears consistent with prior ECG) Atrial Fibrillation  Rhythm:Irregular Rate:Tachycardia     Neuro/Psych neg Seizures    GI/Hepatic PUD,GERD  ,,(+) neg Cirrhosis        Endo/Other    Renal/GU Renal disease     Musculoskeletal  (+) Arthritis ,    Abdominal   Peds  Hematology  (+) Blood dyscrasia, anemia   Anesthesia Other Findings Past Medical History: No date: Allergic rhinitis 12/01/2014: Atypical chest pain 01/12/2013: Back pain No date: COPD (chronic obstructive pulmonary disease) (HCC)     Comment:  FeV1 64%-2007 No date: Dyspnea No date: Dysrhythmia     Comment:  PVCs No date: Emphysema No date: GERD (gastroesophageal reflux disease) 2024: H/O total shoulder replacement, left 2023: H/O total shoulder replacement, right No date: History of lung abscess     Comment:  bronchiectasis with RMLandRLL ersection -1996- Dr Brantley 04/23/2015: Hordeolum externum (stye)     Comment:  Right eye No date: Hypertension No date: Impaired vision     Comment:  glasses No date: Osteoarthritis     Comment:  hands and knees No date: Pneumonia 04/23/2015: Sinusitis, acute No date: Skin  cancer     Comment:  Right eye area   Reproductive/Obstetrics                              Anesthesia Physical Anesthesia Plan  ASA: 3  Anesthesia Plan: General   Post-op Pain Management: Minimal or no pain anticipated   Induction: Intravenous  PONV Risk Score and Plan: 2 and Propofol  infusion, TIVA and Ondansetron   Airway Management Planned: Nasal Cannula  Additional Equipment: None  Intra-op Plan:   Post-operative Plan:   Informed Consent: I have reviewed the patients History and Physical, chart, labs and discussed the procedure including the risks, benefits and alternatives for the proposed anesthesia with the patient or authorized representative who has indicated his/her understanding and acceptance.     Dental advisory given  Plan Discussed with: CRNA and Surgeon  Anesthesia Plan Comments: (Discussed risks of anesthesia with patient, including possibility of difficulty with spontaneous ventilation under anesthesia necessitating airway intervention, PONV, and rare risks such as cardiac or respiratory or neurological events, and allergic reactions. Discussed the role of CRNA in patient's perioperative care. Patient understands.)         Anesthesia Quick Evaluation  "

## 2024-09-08 NOTE — Transfer of Care (Signed)
 Immediate Anesthesia Transfer of Care Note  Patient: [name]  Procedure(s) Performed: CARDIOVERSION  Patient Location: Cath Lab  Anesthesia Type:General  Level of Consciousness: awake, alert , and oriented  Airway & Oxygen Therapy: Patient Spontanous Breathing and Patient connected to nasal cannula oxygen  Post-op Assessment: Report given to RN and Post -op Vital signs reviewed and stable  Post vital signs: Reviewed and stable   Last Vitals:  Vitals Value Taken Time  BP 125/80 1024  Temp 97 1024  Pulse 104 1024  Resp 16 1024  SpO2 95 1024    Last Pain:  Vitals:   09/08/24 0947  TempSrc: Temporal         Complications: No notable events documented.

## 2024-09-08 NOTE — Anesthesia Postprocedure Evaluation (Signed)
"   Anesthesia Post Note  Patient: [name]  Procedure(s) Performed: CARDIOVERSION     Patient location during evaluation: Cath Lab Anesthesia Type: General Level of consciousness: awake and alert Pain management: pain level controlled Vital Signs Assessment: post-procedure vital signs reviewed and stable Respiratory status: spontaneous breathing, nonlabored ventilation, respiratory function stable and patient connected to nasal cannula oxygen Cardiovascular status: blood pressure returned to baseline and stable Postop Assessment: no apparent nausea or vomiting Anesthetic complications: no   No notable events documented.  Last Vitals:  Vitals:   09/08/24 1038 09/08/24 1048  BP: 120/79 123/79  Pulse: 92 89  Resp: 17 18  Temp:    SpO2: 95% 95%    Last Pain:  Vitals:   09/08/24 1048  TempSrc:   PainSc: 0-No pain                 [name]      "

## 2024-09-08 NOTE — CV Procedure (Signed)
 DCC: Rx Eliquis  with no missed doses Anesthesia Propofol   DCC x 4 last 2 with AP manual pressure 175 J, 200 J , 250 J and 300 J  Each tome he would have 3-4 normal sinus beats and then go back into afib Rates as high as 150 bpm  Given 10 mg iv cardizem  and 150 mg iv amiodarone  post procedure Will need AAT Amiodarone  not ideal with his lung dx  Will increase his Cardizem  on d/c   F/U Dr Kate and afib clinic   Maude Emmer MD [facility]

## 2024-09-08 NOTE — Telephone Encounter (Signed)
 Pt c/o medication issue:  1. Name of Medication:   diltiazem  (CARDIZEM  CD) 240 MG 24 hr capsule    2. How are you currently taking this medication (dosage and times per day)? Pharmacy said has questions about how to take it  3. Are you having a reaction (difficulty breathing--STAT)? no  4. What is your medication issue? Pharmacy wants to make sure the dosing is correct because he was only taking 1 a day and now it is twice a day. Also only 30 capsules were called in.

## 2024-09-09 ENCOUNTER — Telehealth: Payer: Self-pay | Admitting: Cardiovascular Disease

## 2024-09-09 ENCOUNTER — Encounter (HOSPITAL_COMMUNITY): Payer: Self-pay | Admitting: Cardiovascular Disease

## 2024-09-09 NOTE — Telephone Encounter (Signed)
 Pt c/o medication issue:  1. Name of Medication: diltiazem  (CARDIZEM  CD) 240 MG 24 hr capsule   2. How are you currently taking this medication (dosage and times per day)? N/A  3. Are you having a reaction (difficulty breathing--STAT)? N/A  4. What is your medication issue? Pharmacy would like clarification on how many times daily this medication should be consumed

## 2024-09-09 NOTE — Telephone Encounter (Signed)
 Called pharmacy to clarify order.  Spoke with, Zanesha, the order was clarified earlier and pt has already picked it up.

## 2024-09-09 NOTE — Telephone Encounter (Signed)
 Spoke with Dr. Nishan who had stated the pt had been cardioverted x4 but did not convert until given Amiodarone . Pt is unable to take Amiodarone  long-term, therefore Dr. Delford had wanted the pt to take an additional 240 mg (total of 480 mg daily) for 15 days post DCCV. This information was relayed to pt's pharmacy. Pharmacy verbalized understanding and stated they would continue to fill the prescription. No further questions at this time.

## 2024-09-13 ENCOUNTER — Ambulatory Visit (HOSPITAL_COMMUNITY): Admitting: Internal Medicine

## 2024-09-16 ENCOUNTER — Encounter (HOSPITAL_COMMUNITY): Payer: Self-pay | Admitting: Internal Medicine

## 2024-09-16 ENCOUNTER — Ambulatory Visit (HOSPITAL_COMMUNITY)
Admission: RE | Admit: 2024-09-16 | Discharge: 2024-09-16 | Disposition: A | Discharge status: Home / Self Care | Source: Ambulatory Visit | Attending: Internal Medicine | Admitting: Internal Medicine

## 2024-09-16 VITALS — BP 128/60 | HR 76 | Ht 67.01 in | Wt 146.2 lb

## 2024-09-16 DIAGNOSIS — D6869 Other thrombophilia: Secondary | ICD-10-CM

## 2024-09-16 DIAGNOSIS — I480 Paroxysmal atrial fibrillation: Secondary | ICD-10-CM | POA: Diagnosis not present

## 2024-09-16 DIAGNOSIS — I4819 Other persistent atrial fibrillation: Secondary | ICD-10-CM | POA: Diagnosis not present

## 2024-09-16 MED ORDER — DILTIAZEM HCL ER COATED BEADS 240 MG PO CP24: 240.0000 mg | ORAL_CAPSULE | Freq: Two times a day (BID) | ORAL | 3 refills | Status: AC

## 2024-09-16 NOTE — Patient Instructions (Signed)
 Stop amlodipine .

## 2024-09-16 NOTE — Progress Notes (Signed)
 "   Primary Care Physician: Ruben Harlene LABOR, MD Primary Cardiologist: Ruben Emmer, MD Electrophysiologist: None     Referring Physician: Dr. Kate Box KHAIR [name] is a 79 y.o. male with a history of frequent PVCs, coronary, aortic arch, and branch vessel atherosclerotic vascular disease by CT imaging, HTN, COPD, and lung abscess in 1996 s/p right and middle lower lobe resection who presents for consultation in the [facility]. S/p hospital admission 10/13-15/2025 for abdominal pain found to be in Afib with RVR. Acute DVT found and began on Eliquis  starter pack. Patient is on Eliquis  for stroke prevention.  On evaluation today, patient is currently in Afib. He is still wearing monitor. He notes as far as he can tell he has had only one episode of Afib while wearing the monitor since hospital discharge. He is taking Eliquis  5 mg BID. He has chronic SOB related to history of lung resection and COPD.   On follow-up 08/3024, patient is currently in A-fib.  He wore a cardiac monitor for 14 days and it showed 95% A-fib burden.  Arrhythmia was detected at deactivation of device.  Patient has been taking Eliquis  5 mg once daily due to a rash developing under his eyes after he began the medication.  He was previously taking it twice a day but then transition to once daily due to the rash.  He does not appear to have cardiac awareness of arrhythmia.  Follow-up 09/16/2024, patient is currently in Afib.  He underwent DCCV on 09/08/2024.  Dr. Admission attempted DCCV x 4 with manual pressure at 1 75J, 200 J, 2 50J and 300 J.  Patient was given 10 mg IV Cardizem  and 150 mg IV amiodarone  postprocedure since patient would have 3-4 normal sinus beats and then go back into A-fib with rates as high as 150 bpm.  Dr. Emmer noted that he is not an ideal amiodarone  candidate given his long history and increased his Cardizem  temporarily to twice a day for 15 days.  No missed doses of  Eliquis .  Today, he denies symptoms of palpitations, chest pain, orthopnea, PND, lower extremity edema, dizziness, presyncope, syncope, bleeding, or neurologic sequela. The patient is tolerating medications without difficulties and is otherwise without complaint today.    he has a BMI of Body mass index is 22.89 kg/m.[name] Filed Weights   09/16/24 0929  Weight: 66.3 kg      Current Outpatient Medications  Medication Sig Dispense Refill   acetaminophen  (TYLENOL ) 500 MG tablet Take 500-1,000 mg by mouth See admin instructions. Take 1,000 mg by mouth in the morning and 500-1,000 mg at bedtime     albuterol  (VENTOLIN  HFA) 108 (90 Base) MCG/ACT inhaler INHALE 2 PUFFS INTO THE LUNGS EVERY 6 HOURS AS NEEDED FOR WHEEZING OR SHORTNESS OF BREATH. (Patient taking differently: Inhale 2 puffs into the lungs See admin instructions. Inhale 2 puffs into the lungs before) 2 each 10   CALCIUM CARBONATE ANTACID PO Take 1,000 mg by mouth in the morning.     Camphor-Menthol -Methyl Sal (SALONPAS EX) Apply 1 each topically daily as needed (sore joints). patches     cetirizine (ZYRTEC) 10 MG tablet Take 10 mg by mouth in the morning.     clobetasol (TEMOVATE) 0.05 % external solution Apply 1 Application topically See admin instructions. Apply to the affected areas 2 times a day as needed for irritation     fluticasone  (FLONASE ) 50 MCG/ACT nasal spray USE 2 SPRAYS IN [facility]  NOSTRIL EVERY DAY (Patient taking differently: Place 2 sprays into both nostrils in the morning.) 48 g 3   GLUCOSAMINE-CHONDROITIN PO Take 1 tablet by mouth 2 (two) times daily.     guaiFENesin  (MUCINEX ) 600 MG 12 hr tablet Take 600 mg by mouth 2 (two) times daily.     ipratropium (ATROVENT ) 0.06 % nasal spray Place 1 spray into both nostrils 4 (four) times daily. (Patient taking differently: Place 1 spray into both nostrils 4 (four) times daily as needed for rhinitis.) 15 mL 12   ketoconazole (NIZORAL) 2 % shampoo Apply 1 Application topically See  admin instructions. Shampoo 2 times a week     loratadine  (CLARITIN ) 10 MG tablet Take 10 mg by mouth every evening.     methocarbamol  (ROBAXIN ) 500 MG tablet Take 1 tablet (500 mg total) by mouth every 6 (six) hours as needed for muscle spasms. 30 tablet 1   montelukast  (SINGULAIR ) 10 MG tablet TAKE 1 TABLET EVERY DAY 90 tablet 3   Multiple Vitamin (MULTIVITAMIN WITH MINERALS) TABS tablet Take 1 tablet by mouth daily with breakfast.     oxyCODONE  (OXY IR/ROXICODONE ) 5 MG immediate release tablet Take 1 tablet (5 mg total) by mouth 3 (three) times daily as needed for moderate pain (pain score 4-6) or severe pain (pain score 7-10) (pain score 4-6). No more than 6 tablets daily. 60 tablet 0   pantoprazole  (PROTONIX ) 20 MG tablet Take 1 tablet by mouth once daily 90 tablet 3   rivaroxaban  (XARELTO ) 20 MG TABS tablet Take 1 tablet (20 mg total) by mouth daily with supper. 30 tablet 6   TRELEGY ELLIPTA  100-62.5-25 MCG/ACT AEPB INHALE 1 PUFF INTO THE LUNGS DAILY 60 each 11   triamcinolone  cream (KENALOG ) 0.1 % Apply 1 Application topically 2 (two) times daily as needed (for itching).     diltiazem  (CARDIZEM  CD) 240 MG 24 hr capsule Take 1 capsule (240 mg total) by mouth in the morning and at bedtime. 60 capsule 3   No current facility-administered medications for this encounter.    Atrial Fibrillation Management history:  Previous antiarrhythmic drugs: none Previous cardioversions: 09/08/24 Previous ablations: none Anticoagulation history: Eliquis    ROS- All systems are reviewed and negative except as per the HPI above.  Physical Exam: BP 128/60   Pulse 76   Ht 5' 7.01 (1.702 m)   Wt 66.3 kg   BMI 22.89 kg/m   GEN- The patient is well appearing, alert and oriented x 3 today.   Neck - no JVD or carotid bruit noted Lungs- Clear to ausculation bilaterally, normal work of breathing Heart- Irregular rate and rhythm, no murmurs, rubs or gallops, PMI not laterally displaced Extremities- no  clubbing, cyanosis, or edema Skin - no rash or ecchymosis noted   EKG today demonstrates  EKG Interpretation Date/Time:  Thursday September 16 2024 09:32:36 EST Ventricular Rate:  76 PR Interval:    QRS Duration:  94 QT Interval:  370 QTC Calculation: 416 R Axis:   44  Text Interpretation: Atrial fibrillation Abnormal ECG When compared with ECG of 08-Sep-2024 10:31, Atrial fibrillation has replaced Sinus rhythm Confirmed by [name] ([id number]) on 09/16/2024 9:53:02 AM     Echo 06/15/24 demonstrated  1. Left ventricular ejection fraction, by estimation, is 60 to 65%. The  left ventricle has normal function. Left ventricular endocardial border  not optimally defined to evaluate regional wall motion. Indeterminate  diastolic filling due to E-A fusion.   2. Right ventricular systolic function is normal.  The right ventricular  size is mildly enlarged.   3. The mitral valve is grossly normal. Trivial mitral valve  regurgitation. No evidence of mitral stenosis.   4. The aortic valve was not well visualized. Aortic valve regurgitation  is not visualized. No aortic stenosis is present.   5. The inferior vena cava is normal in size with greater than 50%  respiratory variability, suggesting right atrial pressure of 3 mmHg.   ASSESSMENT & PLAN CHA2DS2-VASc Score = 4  The patient's score is based upon: CHF History: 0 HTN History: 1 Diabetes History: 0 Stroke History: 0 Vascular Disease History: 1 Age Score: 2 Gender Score: 0       ASSESSMENT AND PLAN: Persistent Atrial Fibrillation (ICD10:  I48.0) The patient's CHA2DS2-VASc score is 4, indicating a 4.8% annual risk of stroke.    Patient is currently in A-fib.  We discussed rhythm control options including AAD therapy and ablation.  We discussed the possibility of beginning AAD therapy and repeat cardioversion.  After discussion, patient declines medication therapy at this time.  He notes he had significant burning and itching  following the cardioversion.  He is not ready to proceed with another cardioversion at this time.  We discussed ablation as a procedure and what to expect during the recovery period for long-term rhythm control.  After discussion, patient wishes to establish with EP to discuss ablation.  Continue diltiazem  240 mg twice daily.  I will stop amlodipine .   Secondary Hypercoagulable State (ICD10:  D68.69) The patient is at significant risk for stroke/thromboembolism based upon his CHA2DS2-VASc Score of 4.   Continue Xarelto  20 mg daily.    Refer to EP to discuss ablation.    [name], [facility] [address] [place], [place] [address] [phone]   "

## 2024-09-22 ENCOUNTER — Encounter: Payer: Self-pay | Admitting: Physician Assistant

## 2024-09-22 ENCOUNTER — Ambulatory Visit (INDEPENDENT_AMBULATORY_CARE_PROVIDER_SITE_OTHER): Admitting: Physician Assistant

## 2024-09-22 VITALS — BP 152/88 | HR 89 | Temp 97.7°F | Resp 20 | Ht 67.01 in | Wt 142.2 lb

## 2024-09-22 DIAGNOSIS — L989 Disorder of the skin and subcutaneous tissue, unspecified: Secondary | ICD-10-CM

## 2024-09-22 DIAGNOSIS — K254 Chronic or unspecified gastric ulcer with hemorrhage: Secondary | ICD-10-CM

## 2024-09-22 DIAGNOSIS — J4489 Other specified chronic obstructive pulmonary disease: Secondary | ICD-10-CM | POA: Insufficient documentation

## 2024-09-22 DIAGNOSIS — G894 Chronic pain syndrome: Secondary | ICD-10-CM

## 2024-09-22 DIAGNOSIS — I4891 Unspecified atrial fibrillation: Secondary | ICD-10-CM

## 2024-09-22 MED ORDER — AZITHROMYCIN 250 MG PO TABS: ORAL_TABLET | ORAL | 0 refills | Status: AC

## 2024-09-22 NOTE — Assessment & Plan Note (Signed)
-   History of gastrointestinal bleeding. Currently on Xarelto  for AFib and DVT. Risk of bleeding versus stroke discussed. - Advised to monitor for signs of gastrointestinal bleeding, such as melena. - Instructed to discontinue Xarelto  if melena is observed.

## 2024-09-22 NOTE — Progress Notes (Signed)
 "  New Patient Office Visit  Subjective    Patient ID: [name], male    DOB: [birth year]  Age: 79 y.o. MRN: [id number]  CC:  Chief Complaint  Patient presents with   Sore Throat   Cough    HPI [name] presents to establish care  Discussed the use of AI scribe software for clinical note transcription with the patient, who gave verbal consent to proceed.  History of Present Illness [name] is a 79 year old male with atrial fibrillation and COPD who presents for a follow-up visit.  Atrial fibrillation on anticoagulation - Recent unsuccessful cardioversion - Awaiting scheduling for ablation procedure with electrophysiology group - On anticoagulation therapy compliantly with Xarelto   Chronic obstructive pulmonary disease and chronic bronchitis symptoms - Followed by pulmonology - Chronic bronchitis symptoms including sinus drainage and mucus production, which worsen with seasonal changes - Chronic productive cough with sputum ranging from beige to dark brown - History of right lung lobectomy due to abscesses, resulting in reduced lung volume - Significant smoking history of 52.5 pack-years, quit in 2002 - Allergic to horses, grass, and dogs - Uses azithromycin  (Z-Pak) once or twice a year for intolerable symptoms, with some relief - Previous steroid therapy was ineffective and caused mood changes  Chronic joint and spinal pain - Chronic joint pain - History of multiple spinal surgeries with placement of planks and screws in neck and lower back - On regular oxycodone  and possibly tramadol  for pain management, not under a formal pain agreement. Patient sounded unsure about management  Otorhinolaryngologic symptoms - Sore throat and cough with some hoarseness, attributed to chronic issues rather than acute illness. Denies any recent fevers or systemic symptoms     Outpatient Encounter Medications as of 09/22/2024  Medication Sig   azithromycin   (ZITHROMAX ) 250 MG tablet Take 2 tablets on day 1, then 1 tablet daily on days 2 through 5   acetaminophen  (TYLENOL ) 500 MG tablet Take 500-1,000 mg by mouth See admin instructions. Take 1,000 mg by mouth in the morning and 500-1,000 mg at bedtime   albuterol  (VENTOLIN  HFA) 108 (90 Base) MCG/ACT inhaler INHALE 2 PUFFS INTO THE LUNGS EVERY 6 HOURS AS NEEDED FOR WHEEZING OR SHORTNESS OF BREATH. (Patient taking differently: Inhale 2 puffs into the lungs See admin instructions. Inhale 2 puffs into the lungs before)   CALCIUM CARBONATE ANTACID PO Take 1,000 mg by mouth in the morning.   Camphor-Menthol -Methyl Sal (SALONPAS EX) Apply 1 each topically daily as needed (sore joints). patches   cetirizine (ZYRTEC) 10 MG tablet Take 10 mg by mouth in the morning.   clobetasol (TEMOVATE) 0.05 % external solution Apply 1 Application topically See admin instructions. Apply to the affected areas 2 times a day as needed for irritation   diltiazem  (CARDIZEM  CD) 240 MG 24 hr capsule Take 1 capsule (240 mg total) by mouth in the morning and at bedtime.   fluticasone  (FLONASE ) 50 MCG/ACT nasal spray USE 2 SPRAYS IN EACH NOSTRIL EVERY DAY (Patient taking differently: Place 2 sprays into both nostrils in the morning.)   GLUCOSAMINE-CHONDROITIN PO Take 1 tablet by mouth 2 (two) times daily.   guaiFENesin  (MUCINEX ) 600 MG 12 hr tablet Take 600 mg by mouth 2 (two) times daily.   ipratropium (ATROVENT ) 0.06 % nasal spray Place 1 spray into both nostrils 4 (four) times daily. (Patient taking differently: Place 1 spray into both nostrils 4 (four) times daily as needed for rhinitis.)  ketoconazole (NIZORAL) 2 % shampoo Apply 1 Application topically See admin instructions. Shampoo 2 times a week   loratadine  (CLARITIN ) 10 MG tablet Take 10 mg by mouth every evening.   methocarbamol  (ROBAXIN ) 500 MG tablet Take 1 tablet (500 mg total) by mouth every 6 (six) hours as needed for muscle spasms.   montelukast  (SINGULAIR ) 10 MG tablet  TAKE 1 TABLET EVERY DAY   Multiple Vitamin (MULTIVITAMIN WITH MINERALS) TABS tablet Take 1 tablet by mouth daily with breakfast.   oxyCODONE  (OXY IR/ROXICODONE ) 5 MG immediate release tablet Take 1 tablet (5 mg total) by mouth 3 (three) times daily as needed for moderate pain (pain score 4-6) or severe pain (pain score 7-10) (pain score 4-6). No more than 6 tablets daily.   pantoprazole  (PROTONIX ) 20 MG tablet Take 1 tablet by mouth once daily   rivaroxaban  (XARELTO ) 20 MG TABS tablet Take 1 tablet (20 mg total) by mouth daily with supper.   TRELEGY ELLIPTA  100-62.5-25 MCG/ACT AEPB INHALE 1 PUFF INTO THE LUNGS DAILY   triamcinolone  cream (KENALOG ) 0.1 % Apply 1 Application topically 2 (two) times daily as needed (for itching).   No facility-administered encounter medications on file as of 09/22/2024.    Past Medical History:  Diagnosis Date   Allergic rhinitis    Atypical chest pain 12/01/2014   Back pain 01/12/2013   COPD (chronic obstructive pulmonary disease) (HCC)    FeV1 64%-2007   Dyspnea    Dysrhythmia    PVCs   Emphysema    GERD (gastroesophageal reflux disease)    H/O total shoulder replacement, left 2024   H/O total shoulder replacement, right 2023   History of lung abscess    bronchiectasis with RMLandRLL ersection -1996- Dr Brantley   Hordeolum externum (stye) 04/23/2015   Right eye   Hypertension    Impaired vision    glasses   Osteoarthritis    hands and knees   Pneumonia    Sinusitis, acute 04/23/2015   Skin cancer    Right eye area    Past Surgical History:  Procedure Laterality Date   CARDIOVERSION N/A 09/08/2024   Procedure: CARDIOVERSION;  Surgeon: Delford Maude BROCKS, MD;  Location: [facility];  Service: Cardiovascular;  Laterality: N/A;   COLONOSCOPY  11/20/2010   Dr.Stark   ESOPHAGOGASTRODUODENOSCOPY (EGD) WITH PROPOFOL  N/A 10/31/2021   Procedure: ESOPHAGOGASTRODUODENOSCOPY (EGD) WITH PROPOFOL ;  Surgeon: Avram Lupita BRAVO, MD;  Location: [facility] ENDOSCOPY;   Service: Gastroenterology;  Laterality: N/A;   HEMOSTASIS CONTROL  10/31/2021   Procedure: HEMOSTASIS CONTROL;  Surgeon: Avram Lupita BRAVO, MD;  Location: [facility] ENDOSCOPY;  Service: Gastroenterology;;   HERNIA REPAIR  10/03/2009   HOT HEMOSTASIS N/A 10/31/2021   Procedure: HOT HEMOSTASIS (ARGON PLASMA COAGULATION/BICAP);  Surgeon: Avram Lupita BRAVO, MD;  Location: [facility] ENDOSCOPY;  Service: Gastroenterology;  Laterality: N/A;   left knee     lower back surgery  09/02/2008   06/2009   LUNG SURGERY     RML andRUL removed due to bleeding and bronchiectasis and lung abcess   MOUTH SURGERY     teeth extraction   NECK SURGERY  05/03/2008   REVERSE SHOULDER ARTHROPLASTY Right 08/22/2022   Procedure: RIGHT REVERSE SHOULDER ARTHROPLASTY;  Surgeon: Genelle Standing, MD;  Location: [facility] OR;  Service: Orthopedics;  Laterality: Right;   REVERSE SHOULDER ARTHROPLASTY Left 08/05/2023   Procedure: LEFT REVERSE SHOULDER ARTHROPLASTY;  Surgeon: Genelle Standing, MD;  Location: [facility];  Service: Orthopedics;  Laterality: Left;   right  foot surgery     SCLEROTHERAPY  10/31/2021   Procedure: SCLEROTHERAPY;  Surgeon: Avram Lupita BRAVO, MD;  Location: [facility] ENDOSCOPY;  Service: Gastroenterology;;   TONSILLECTOMY     removed as a child   TOTAL KNEE ARTHROPLASTY Right 06/04/2024   Procedure: ARTHROPLASTY, KNEE, TOTAL;  Surgeon: Vernetta Lonni GRADE, MD;  Location: [facility] ORS;  Service: Orthopedics;  Laterality: Right;   VIDEO BRONCHOSCOPY N/A 07/12/2020   Procedure: VIDEO BRONCHOSCOPY;  Surgeon: Kerrin Elspeth BROCKS, MD;  Location: [facility] OR;  Service: Thoracic;  Laterality: N/A;   VIDEO BRONCHOSCOPY WITH ENDOBRONCHIAL NAVIGATION N/A 07/12/2020   Procedure: VIDEO BRONCHOSCOPY WITH ENDOBRONCHIAL NAVIGATION;  Surgeon: Kerrin Elspeth BROCKS, MD;  Location: [facility] OR;  Service: Thoracic;  Laterality: N/A;    Family History  Problem Relation Age of Onset   Heart failure Mother        age 51   COPD Mother    Prostate  cancer Father        father died prostate ca   Obstructive Sleep Apnea Brother    Obesity Brother    Colon cancer Paternal Grandmother    Healthy Son    Coronary artery disease Other        1st degree relative<60   Stroke Other        1st degree relative<50   Esophageal cancer Neg Hx    Stomach cancer Neg Hx     Social History   Socioeconomic History   Marital status: Married    Spouse name: Darcee   Number of children: Not on file   Years of education: Not on file   Highest education level: Bachelor's degree (e.g., BA, AB, BS)  Occupational History   Occupation: retired Electrical Engineer  Tobacco Use   Smoking status: Former    Current packs/day: 0.00    Average packs/day: 1.5 packs/day for 38.0 years (57.0 ttl pk-yrs)    Types: Cigarettes    Start date: 09/03/1962    Quit date: 09/03/2000    Years since quitting: 24.0    Passive exposure: Past   Smokeless tobacco: Never   Tobacco comments:    Former smoker 07/05/24  Vaping Use   Vaping status: Never Used  Substance and Sexual Activity   Alcohol use: Yes    Alcohol/week: 3.0 - 4.0 standard drinks of alcohol    Types: 3 - 4 Cans of beer per week    Comment: 1 drink 3-4 times a week 07/05/24   Drug use: No   Sexual activity: Never  Other Topics Concern   Not on file  Social History Narrative   Retired Secondary School Teacher   Patient states former smoker. 1 1/2 ppd x 38 yrs  Quit in Jan 2002   Married - 2 weeks (4th marriage)   divorced,  remarried 84-2004 (lost wife to lung ca),  remarried (divorced),    1 son  - 51 Josh)   Alcohol use-yes (2-3 beers per week)           Social Drivers of Health   Tobacco Use: Medium Risk (09/22/2024)   Patient History    Smoking Tobacco Use: Former    Smokeless Tobacco Use: Never    Passive Exposure: Past  Physicist, Medical Strain: Low Risk (06/24/2024)   Overall Financial Resource Strain (CARDIA)    Difficulty of Paying Living Expenses: Not hard at all  Food  Insecurity: No Food Insecurity (06/24/2024)   Epic    Worried About Programme Researcher, Broadcasting/film/video in  the Last Year: Never true    Ran Out of Food in the Last Year: Never true  Transportation Needs: No Transportation Needs (06/24/2024)   Epic    Lack of Transportation (Medical): No    Lack of Transportation (Non-Medical): No  Physical Activity: Insufficiently Active (06/24/2024)   Exercise Vital Sign    Days of Exercise per Week: 6 days    Minutes of Exercise per Session: 20 min  Stress: No Stress Concern Present (06/24/2024)   [employer] of Occupational Health - Occupational Stress Questionnaire    Feeling of Stress: Not at all  Social Connections: Unknown (06/24/2024)   Social Connection and Isolation Panel    Frequency of Communication with Friends and Family: More than three times a week    Frequency of Social Gatherings with Friends and Family: Once a week    Attends Religious Services: More than 4 times per year    Active Member of [employer] or Organizations: Yes    Attends Banker Meetings: More than 4 times per year    Marital Status: Not on file  Intimate Partner Violence: Not At Risk (06/17/2024)   Epic    Fear of Current or Ex-Partner: No    Emotionally Abused: No    Physically Abused: No    Sexually Abused: No  Depression (PHQ2-9): Low Risk (09/22/2024)   Depression (PHQ2-9)    PHQ-2 Score: 0  Alcohol Screen: Low Risk (06/24/2024)   Alcohol Screen    Last Alcohol Screening Score (AUDIT): 4  Housing: Low Risk (06/24/2024)   Epic    Unable to Pay for Housing in the Last Year: No    Number of Times Moved in the Last Year: 0    Homeless in the Last Year: No  Utilities: Not At Risk (06/17/2024)   Epic    Threatened with loss of utilities: No  Health Literacy: Adequate Health Literacy (03/16/2024)   B1300 Health Literacy    Frequency of need for help with medical instructions: Never    ROS See HPI, all else negative.      Objective    BP (!) 152/88    Pulse 89   Temp 97.7 F (36.5 C)   Resp 20   Ht 5' 7.01 (1.702 m)   Wt 142 lb 3.2 oz (64.5 kg)   SpO2 95%   BMI 22.27 kg/m   Physical Exam Constitutional:      Appearance: Normal appearance. He is not ill-appearing.  Eyes:     Extraocular Movements: Extraocular movements intact.  Pulmonary:     Effort: Pulmonary effort is normal.     Comments: No significant WOB. Speaks in full sentences. Musculoskeletal:        General: Normal range of motion.     Cervical back: Normal range of motion.  Neurological:     Mental Status: He is alert and oriented to person, place, and time.  Psychiatric:        Behavior: Behavior normal.         Assessment & Plan:   Problem List Items Addressed This Visit       Cardiovascular and Mediastinum   A-fib (HCC)   - History of atrial fibrillation with recent unsuccessful cardioversion. - Awaiting scheduling for ablation procedure with electrophysiology group. - On anticoagulation therapy compliantly with Xarelto .        Respiratory   COPD with chronic bronchitis (HCC) - Primary   - Follows with pulmonology. Persistent symptoms include mucus production and dyspnea.  Recent nasal spray provided uncertain relief. Azithromycin  used intermittently for symptom relief.  - Steroids previously ineffective and caused mood changes.  - Prescribed azithromycin  (Z-Pak) as he reports improvement in the past.  - History of right lung lobectomy due to abscess.      Relevant Medications   azithromycin  (ZITHROMAX ) 250 MG tablet     Digestive   Gastric ulcer   - History of gastrointestinal bleeding. Currently on Xarelto  for AFib and DVT. Risk of bleeding versus stroke discussed. - Advised to monitor for signs of gastrointestinal bleeding, such as melena. - Instructed to discontinue Xarelto  if melena is observed.        Musculoskeletal and Integument   Skin lesions   Sees dermatology, Dr. Rennie, for annual skin checks.        Other   Chronic  pain syndrome   - Chronic pain secondary to degenerative spine disease with multiple spinal surgeries. - Pain managed with combination of tramadol  and oxycodone . He is seems open to weaning off, but then states that he is always in pain and that it's how he knows he is alive. - Will refer to pain management for specialized care and potential alternative therapies.      Relevant Orders   Ambulatory referral to Pain Clinic     Return in about 3 months (around 12/21/2024) for Annual Physical Appt.   Honora Seip, PA-C   "

## 2024-09-22 NOTE — Assessment & Plan Note (Signed)
 Sees dermatology, Dr. Rennie, for annual skin checks.

## 2024-09-22 NOTE — Assessment & Plan Note (Addendum)
-   History of atrial fibrillation with recent unsuccessful cardioversion. - Awaiting scheduling for ablation procedure with electrophysiology group. - On anticoagulation therapy compliantly with Xarelto .

## 2024-09-22 NOTE — Assessment & Plan Note (Addendum)
-   Follows with pulmonology. Persistent symptoms include mucus production and dyspnea. Recent nasal spray provided uncertain relief. Azithromycin  used intermittently for symptom relief.  - Steroids previously ineffective and caused mood changes.  - Prescribed azithromycin  (Z-Pak) as he reports improvement in the past.  - History of right lung lobectomy due to abscess.

## 2024-09-22 NOTE — Assessment & Plan Note (Signed)
-   Chronic pain secondary to degenerative spine disease with multiple spinal surgeries. - Pain managed with combination of tramadol  and oxycodone . He is seems open to weaning off, but then states that he is always in pain and that it's how he knows he is alive. - Will refer to pain management for specialized care and potential alternative therapies.

## 2024-09-22 NOTE — Patient Instructions (Addendum)
" °  VISIT SUMMARY: During your follow-up visit, we discussed your ongoing management for atrial fibrillation, chronic obstructive pulmonary disease (COPD), chronic pain, and history of gastrointestinal bleeding. We reviewed your current medications and symptoms, and made adjustments to your treatment plan as needed.  YOUR PLAN: -CHRONIC OBSTRUCTIVE PULMONARY DISEASE WITH HISTORY OF RIGHT LUNG LOBECTOMY: COPD is a chronic inflammatory lung disease that causes obstructed airflow from the lungs. You have a history of right lung lobectomy due to abscesses, which has reduced your lung volume. We will continue to manage your symptoms with azithromycin  (Z-Pak) as needed and avoid steroids due to their adverse effects and your upcoming AFib ablation. Please call the office of Dr. Jude to schedule your next pulmonology appointment. It appears as though you were due for follow-up in October.  -CHRONIC PAIN DUE TO DEGENERATIVE SPINE DISEASE, POST SPINAL SURGERIES: Degenerative spine disease involves the breakdown of the spine's discs and joints, leading to chronic pain. You have had multiple spinal surgeries and are currently managing your pain with tramadol  and oxycodone . We are referring you to pain management for specialized care and potential alternative therapies.  -HISTORY OF GASTROINTESTINAL BLEEDING ON ANTICOAGULATION: You have a history of gastrointestinal bleeding and are currently on Xarelto  for atrial fibrillation and deep vein thrombosis (DVT). It is important to monitor for signs of gastrointestinal bleeding, such as black, tarry stools (melena). If you notice any signs of bleeding, discontinue Xarelto  and contact us  immediately.  INSTRUCTIONS: Please monitor for any signs of gastrointestinal bleeding and discontinue Xarelto  if you observe any. Follow up with pulmonology as scheduled and attend your pain management referral for specialized care. Continue with your current medications and follow the  prescribed plan for managing your COPD and chronic pain.    Contains text generated by [employer]bridge.   "

## 2024-10-27 ENCOUNTER — Ambulatory Visit: Admitting: Cardiology

## 2024-11-08 ENCOUNTER — Ambulatory Visit: Admitting: Orthopaedic Surgery

## 2024-12-21 ENCOUNTER — Ambulatory Visit: Admitting: Physician Assistant

## 2025-03-17 ENCOUNTER — Ambulatory Visit
